# Patient Record
Sex: Female | Born: 1940
Health system: Southern US, Community
[De-identification: ages and names within clinical notes are randomized; demographics above are authoritative.]

## PROBLEM LIST (undated history)

## (undated) DIAGNOSIS — F419 Anxiety disorder, unspecified: Secondary | ICD-10-CM

## (undated) DIAGNOSIS — E119 Type 2 diabetes mellitus without complications: Secondary | ICD-10-CM

## (undated) DIAGNOSIS — M1990 Unspecified osteoarthritis, unspecified site: Secondary | ICD-10-CM

## (undated) DIAGNOSIS — G5600 Carpal tunnel syndrome, unspecified upper limb: Secondary | ICD-10-CM

## (undated) DIAGNOSIS — L570 Actinic keratosis: Secondary | ICD-10-CM

## (undated) DIAGNOSIS — I5030 Unspecified diastolic (congestive) heart failure: Secondary | ICD-10-CM

## (undated) DIAGNOSIS — G2581 Restless legs syndrome: Secondary | ICD-10-CM

## (undated) DIAGNOSIS — H9190 Unspecified hearing loss, unspecified ear: Secondary | ICD-10-CM

## (undated) DIAGNOSIS — Z87442 Personal history of urinary calculi: Secondary | ICD-10-CM

## (undated) DIAGNOSIS — G4730 Sleep apnea, unspecified: Secondary | ICD-10-CM

## (undated) DIAGNOSIS — C50919 Malignant neoplasm of unspecified site of unspecified female breast: Secondary | ICD-10-CM

## (undated) DIAGNOSIS — I10 Essential (primary) hypertension: Secondary | ICD-10-CM

## (undated) DIAGNOSIS — S92909A Unspecified fracture of unspecified foot, initial encounter for closed fracture: Secondary | ICD-10-CM

## (undated) DIAGNOSIS — M952 Other acquired deformity of head: Secondary | ICD-10-CM

## (undated) DIAGNOSIS — F329 Major depressive disorder, single episode, unspecified: Secondary | ICD-10-CM

## (undated) DIAGNOSIS — Z789 Other specified health status: Secondary | ICD-10-CM

## (undated) DIAGNOSIS — Z923 Personal history of irradiation: Secondary | ICD-10-CM

## (undated) DIAGNOSIS — Z9109 Other allergy status, other than to drugs and biological substances: Secondary | ICD-10-CM

## (undated) DIAGNOSIS — R296 Repeated falls: Secondary | ICD-10-CM

## (undated) HISTORY — DX: Restless legs syndrome: G25.81

## (undated) HISTORY — DX: Other specified health status: Z78.9

## (undated) HISTORY — PX: INCISION AND DRAINAGE PERIRECTAL ABSCESS: SHX1804

## (undated) HISTORY — DX: Unspecified hearing loss, unspecified ear: H91.90

## (undated) HISTORY — DX: Unspecified osteoarthritis, unspecified site: M19.90

## (undated) HISTORY — DX: Unspecified diastolic (congestive) heart failure: I50.30

## (undated) HISTORY — DX: Major depressive disorder, single episode, unspecified: F32.9

## (undated) HISTORY — DX: Essential (primary) hypertension: I10

## (undated) HISTORY — DX: Actinic keratosis: L57.0

## (undated) HISTORY — DX: Type 2 diabetes mellitus without complications: E11.9

## (undated) HISTORY — DX: Other acquired deformity of head: M95.2

## (undated) HISTORY — DX: Sleep apnea, unspecified: G47.30

## (undated) HISTORY — PX: REPLACEMENT TOTAL KNEE: SUR1224

## (undated) HISTORY — DX: Unspecified fracture of unspecified foot, initial encounter for closed fracture: S92.909A

## (undated) HISTORY — PX: OTHER SURGICAL HISTORY: SHX169

## (undated) HISTORY — PX: APPENDECTOMY: SHX54

## (undated) HISTORY — DX: Malignant neoplasm of unspecified site of unspecified female breast: C50.919

## (undated) HISTORY — DX: Carpal tunnel syndrome, unspecified upper limb: G56.00

---

## 1977-05-18 HISTORY — PX: TUBAL LIGATION: SHX77

## 1999-01-24 ENCOUNTER — Encounter: Admission: RE | Admit: 1999-01-24 | Discharge: 1999-04-24 | Payer: Self-pay | Admitting: Internal Medicine

## 2004-04-02 ENCOUNTER — Ambulatory Visit: Payer: Self-pay | Admitting: Internal Medicine

## 2004-05-18 HISTORY — PX: JOINT REPLACEMENT: SHX530

## 2004-09-30 ENCOUNTER — Ambulatory Visit: Payer: Self-pay | Admitting: Internal Medicine

## 2005-02-09 ENCOUNTER — Ambulatory Visit: Payer: Self-pay | Admitting: Internal Medicine

## 2005-03-16 ENCOUNTER — Ambulatory Visit: Payer: Self-pay | Admitting: Internal Medicine

## 2005-05-25 ENCOUNTER — Ambulatory Visit: Payer: Self-pay | Admitting: Internal Medicine

## 2005-05-29 ENCOUNTER — Ambulatory Visit: Payer: Self-pay | Admitting: Internal Medicine

## 2005-06-01 ENCOUNTER — Ambulatory Visit: Payer: Self-pay | Admitting: Internal Medicine

## 2005-06-08 ENCOUNTER — Ambulatory Visit: Payer: Self-pay | Admitting: Internal Medicine

## 2005-06-16 ENCOUNTER — Ambulatory Visit: Payer: Self-pay | Admitting: Cardiology

## 2005-06-17 ENCOUNTER — Ambulatory Visit: Payer: Self-pay

## 2005-06-18 ENCOUNTER — Ambulatory Visit: Payer: Self-pay | Admitting: Cardiology

## 2005-06-18 ENCOUNTER — Encounter: Payer: Self-pay | Admitting: Cardiology

## 2005-06-19 ENCOUNTER — Inpatient Hospital Stay (HOSPITAL_COMMUNITY): Admission: RE | Admit: 2005-06-19 | Discharge: 2005-06-24 | Payer: Self-pay | Admitting: Orthopedic Surgery

## 2005-06-22 ENCOUNTER — Ambulatory Visit: Payer: Self-pay | Admitting: Internal Medicine

## 2005-08-18 ENCOUNTER — Ambulatory Visit: Payer: Self-pay | Admitting: Internal Medicine

## 2005-10-19 ENCOUNTER — Ambulatory Visit: Payer: Self-pay | Admitting: Internal Medicine

## 2005-11-16 ENCOUNTER — Encounter: Payer: Self-pay | Admitting: Internal Medicine

## 2005-11-16 ENCOUNTER — Other Ambulatory Visit: Admission: RE | Admit: 2005-11-16 | Discharge: 2005-11-16 | Payer: Self-pay | Admitting: Internal Medicine

## 2005-11-16 ENCOUNTER — Ambulatory Visit: Payer: Self-pay | Admitting: Internal Medicine

## 2006-04-06 ENCOUNTER — Ambulatory Visit: Payer: Self-pay | Admitting: Internal Medicine

## 2006-07-07 ENCOUNTER — Ambulatory Visit: Payer: Self-pay | Admitting: Internal Medicine

## 2006-10-20 ENCOUNTER — Ambulatory Visit: Payer: Self-pay | Admitting: Internal Medicine

## 2006-10-20 LAB — CONVERTED CEMR LAB
CO2: 32 meq/L (ref 19–32)
Calcium: 9.4 mg/dL (ref 8.4–10.5)
Chloride: 103 meq/L (ref 96–112)
Creatinine, Ser: 0.7 mg/dL (ref 0.4–1.2)
Glucose, Bld: 123 mg/dL — ABNORMAL HIGH (ref 70–99)
Sodium: 140 meq/L (ref 135–145)

## 2007-01-27 DIAGNOSIS — I10 Essential (primary) hypertension: Secondary | ICD-10-CM | POA: Insufficient documentation

## 2007-01-27 DIAGNOSIS — M1990 Unspecified osteoarthritis, unspecified site: Secondary | ICD-10-CM | POA: Insufficient documentation

## 2007-02-07 ENCOUNTER — Telehealth: Payer: Self-pay | Admitting: Internal Medicine

## 2007-02-09 ENCOUNTER — Ambulatory Visit (HOSPITAL_BASED_OUTPATIENT_CLINIC_OR_DEPARTMENT_OTHER): Admission: RE | Admit: 2007-02-09 | Discharge: 2007-02-09 | Payer: Self-pay | Admitting: Internal Medicine

## 2007-02-09 ENCOUNTER — Encounter: Payer: Self-pay | Admitting: Pulmonary Disease

## 2007-02-22 ENCOUNTER — Telehealth: Payer: Self-pay | Admitting: *Deleted

## 2007-02-23 ENCOUNTER — Ambulatory Visit: Payer: Self-pay | Admitting: Pulmonary Disease

## 2007-02-25 ENCOUNTER — Ambulatory Visit: Payer: Self-pay | Admitting: Internal Medicine

## 2007-03-01 ENCOUNTER — Encounter: Payer: Self-pay | Admitting: *Deleted

## 2007-03-01 DIAGNOSIS — G4733 Obstructive sleep apnea (adult) (pediatric): Secondary | ICD-10-CM | POA: Insufficient documentation

## 2007-03-10 ENCOUNTER — Ambulatory Visit: Payer: Self-pay | Admitting: Pulmonary Disease

## 2007-03-14 ENCOUNTER — Encounter: Payer: Self-pay | Admitting: Internal Medicine

## 2007-04-05 ENCOUNTER — Encounter: Payer: Self-pay | Admitting: Pulmonary Disease

## 2007-05-05 ENCOUNTER — Ambulatory Visit: Payer: Self-pay | Admitting: Pulmonary Disease

## 2007-05-05 DIAGNOSIS — G2581 Restless legs syndrome: Secondary | ICD-10-CM | POA: Insufficient documentation

## 2007-05-05 HISTORY — DX: Restless legs syndrome: G25.81

## 2007-05-20 ENCOUNTER — Ambulatory Visit: Payer: Self-pay | Admitting: Family Medicine

## 2007-06-24 ENCOUNTER — Telehealth: Payer: Self-pay | Admitting: Internal Medicine

## 2007-07-07 ENCOUNTER — Telehealth: Payer: Self-pay | Admitting: Internal Medicine

## 2007-07-20 ENCOUNTER — Ambulatory Visit: Payer: Self-pay | Admitting: Internal Medicine

## 2007-07-20 LAB — CONVERTED CEMR LAB
CO2: 33 meq/L — ABNORMAL HIGH (ref 19–32)
Calcium: 9.8 mg/dL (ref 8.4–10.5)
Chloride: 100 meq/L (ref 96–112)
Hgb A1c MFr Bld: 7.2 % — ABNORMAL HIGH (ref 4.6–6.0)
Potassium: 4.5 meq/L (ref 3.5–5.1)
Sodium: 140 meq/L (ref 135–145)

## 2007-10-27 ENCOUNTER — Ambulatory Visit: Payer: Self-pay | Admitting: Internal Medicine

## 2007-10-31 ENCOUNTER — Telehealth: Payer: Self-pay | Admitting: *Deleted

## 2008-03-16 ENCOUNTER — Ambulatory Visit: Payer: Self-pay | Admitting: Internal Medicine

## 2008-03-16 LAB — CONVERTED CEMR LAB
ALT: 19 units/L (ref 0–35)
Alkaline Phosphatase: 86 units/L (ref 39–117)
Bilirubin, Direct: 0.2 mg/dL (ref 0.0–0.3)
LDL Cholesterol: 102 mg/dL — ABNORMAL HIGH (ref 0–99)
Total Bilirubin: 0.8 mg/dL (ref 0.3–1.2)
Total Protein: 6.8 g/dL (ref 6.0–8.3)

## 2008-03-22 ENCOUNTER — Ambulatory Visit: Payer: Self-pay | Admitting: Internal Medicine

## 2008-03-29 ENCOUNTER — Ambulatory Visit: Payer: Self-pay | Admitting: Internal Medicine

## 2008-03-30 ENCOUNTER — Telehealth: Payer: Self-pay | Admitting: *Deleted

## 2008-05-07 ENCOUNTER — Telehealth: Payer: Self-pay | Admitting: Internal Medicine

## 2008-07-05 ENCOUNTER — Ambulatory Visit: Payer: Self-pay | Admitting: Internal Medicine

## 2008-07-05 LAB — CONVERTED CEMR LAB
ALT: 17 units/L (ref 0–35)
AST: 19 units/L (ref 0–37)
Bilirubin, Direct: 0.2 mg/dL (ref 0.0–0.3)
Microalb, Ur: 0.8 mg/dL (ref 0.0–1.9)
Total Bilirubin: 0.7 mg/dL (ref 0.3–1.2)
Total Protein: 6.9 g/dL (ref 6.0–8.3)

## 2008-07-18 ENCOUNTER — Ambulatory Visit: Payer: Self-pay | Admitting: Internal Medicine

## 2008-07-24 ENCOUNTER — Telehealth: Payer: Self-pay | Admitting: Internal Medicine

## 2008-07-30 ENCOUNTER — Telehealth: Payer: Self-pay | Admitting: Internal Medicine

## 2008-10-18 ENCOUNTER — Ambulatory Visit: Payer: Self-pay | Admitting: Internal Medicine

## 2008-10-18 DIAGNOSIS — E663 Overweight: Secondary | ICD-10-CM | POA: Insufficient documentation

## 2008-10-18 LAB — CONVERTED CEMR LAB
CO2: 30 meq/L (ref 19–32)
Creatinine, Ser: 0.5 mg/dL (ref 0.4–1.2)
GFR calc non Af Amer: 129.8 mL/min (ref >60)
Glucose, Bld: 132 mg/dL — ABNORMAL HIGH (ref 70–99)
Potassium: 3.7 meq/L (ref 3.5–5.1)
Sodium: 138 meq/L (ref 135–145)

## 2009-03-07 ENCOUNTER — Ambulatory Visit: Payer: Self-pay | Admitting: Internal Medicine

## 2009-06-07 ENCOUNTER — Ambulatory Visit: Payer: Self-pay | Admitting: Internal Medicine

## 2009-06-07 LAB — CONVERTED CEMR LAB
BUN: 15 mg/dL (ref 6–23)
CO2: 32 meq/L (ref 19–32)
Calcium: 9.2 mg/dL (ref 8.4–10.5)
Creatinine, Ser: 0.6 mg/dL (ref 0.4–1.2)
GFR calc non Af Amer: 104.98 mL/min (ref >60)
Glucose, Bld: 136 mg/dL — ABNORMAL HIGH (ref 70–99)
Hgb A1c MFr Bld: 7.3 % — ABNORMAL HIGH (ref 4.6–6.5)
Potassium: 3.4 meq/L — ABNORMAL LOW (ref 3.5–5.1)
Sodium: 138 meq/L (ref 135–145)

## 2009-07-15 ENCOUNTER — Ambulatory Visit: Payer: Self-pay | Admitting: Family Medicine

## 2009-07-16 ENCOUNTER — Ambulatory Visit: Payer: Self-pay | Admitting: Family Medicine

## 2009-07-23 ENCOUNTER — Ambulatory Visit: Payer: Self-pay | Admitting: Internal Medicine

## 2009-07-24 ENCOUNTER — Ambulatory Visit: Payer: Self-pay | Admitting: Internal Medicine

## 2009-08-22 ENCOUNTER — Encounter: Payer: Self-pay | Admitting: Internal Medicine

## 2009-08-30 ENCOUNTER — Ambulatory Visit: Payer: Self-pay | Admitting: Internal Medicine

## 2009-08-30 LAB — CONVERTED CEMR LAB
Basophils Relative: 0.4 % (ref 0.0–3.0)
Eosinophils Absolute: 0.4 10*3/uL (ref 0.0–0.7)
Eosinophils Relative: 3.9 % (ref 0.0–5.0)
Lymphocytes Relative: 22.8 % (ref 12.0–46.0)
Monocytes Relative: 8.2 % (ref 3.0–12.0)
Neutro Abs: 7.2 10*3/uL (ref 1.4–7.7)
Neutrophils Relative %: 64.7 % (ref 43.0–77.0)
Platelets: 268 10*3/uL (ref 150.0–400.0)
RDW: 13.7 % (ref 11.5–14.6)

## 2009-09-04 ENCOUNTER — Telehealth: Payer: Self-pay | Admitting: Internal Medicine

## 2009-09-09 ENCOUNTER — Ambulatory Visit: Payer: Self-pay | Admitting: Internal Medicine

## 2010-02-14 ENCOUNTER — Ambulatory Visit: Payer: Self-pay | Admitting: Internal Medicine

## 2010-02-14 ENCOUNTER — Telehealth: Payer: Self-pay | Admitting: Internal Medicine

## 2010-02-14 LAB — CONVERTED CEMR LAB
BUN: 15 mg/dL (ref 6–23)
CO2: 33 meq/L — ABNORMAL HIGH (ref 19–32)
Chloride: 98 meq/L (ref 96–112)
Creatinine, Ser: 0.7 mg/dL (ref 0.4–1.2)
GFR calc non Af Amer: 90.68 mL/min (ref >60)
LDH: 181 units/L (ref 94–250)
Potassium: 3.8 meq/L (ref 3.5–5.1)

## 2010-03-04 ENCOUNTER — Encounter: Admission: RE | Admit: 2010-03-04 | Discharge: 2010-03-04 | Payer: Self-pay | Admitting: Internal Medicine

## 2010-03-12 ENCOUNTER — Encounter: Payer: Self-pay | Admitting: Internal Medicine

## 2010-03-12 ENCOUNTER — Encounter: Admission: RE | Admit: 2010-03-12 | Discharge: 2010-03-12 | Payer: Self-pay | Admitting: Internal Medicine

## 2010-03-20 ENCOUNTER — Encounter: Payer: Self-pay | Admitting: Internal Medicine

## 2010-03-21 ENCOUNTER — Ambulatory Visit (HOSPITAL_COMMUNITY): Admission: RE | Admit: 2010-03-21 | Discharge: 2010-03-21 | Payer: Self-pay | Admitting: Internal Medicine

## 2010-03-25 ENCOUNTER — Ambulatory Visit: Admission: RE | Admit: 2010-03-25 | Discharge: 2010-03-31 | Payer: Self-pay | Admitting: Radiation Oncology

## 2010-03-26 ENCOUNTER — Encounter: Payer: Self-pay | Admitting: Internal Medicine

## 2010-04-04 ENCOUNTER — Encounter: Payer: Self-pay | Admitting: Internal Medicine

## 2010-04-04 ENCOUNTER — Telehealth: Payer: Self-pay | Admitting: Internal Medicine

## 2010-04-04 DIAGNOSIS — C50411 Malignant neoplasm of upper-outer quadrant of right female breast: Secondary | ICD-10-CM | POA: Insufficient documentation

## 2010-04-17 ENCOUNTER — Encounter: Payer: Self-pay | Admitting: Internal Medicine

## 2010-04-17 ENCOUNTER — Ambulatory Visit: Payer: Self-pay | Admitting: Cardiology

## 2010-04-18 ENCOUNTER — Ambulatory Visit (HOSPITAL_COMMUNITY)
Admission: RE | Admit: 2010-04-18 | Discharge: 2010-04-18 | Payer: Self-pay | Source: Home / Self Care | Admitting: Internal Medicine

## 2010-04-22 ENCOUNTER — Encounter: Admission: RE | Admit: 2010-04-22 | Discharge: 2010-04-22 | Payer: Self-pay | Admitting: General Surgery

## 2010-04-23 ENCOUNTER — Encounter
Admission: RE | Admit: 2010-04-23 | Discharge: 2010-04-23 | Payer: Self-pay | Source: Home / Self Care | Admitting: General Surgery

## 2010-04-23 ENCOUNTER — Encounter: Payer: Self-pay | Admitting: Internal Medicine

## 2010-04-23 ENCOUNTER — Ambulatory Visit
Admission: RE | Admit: 2010-04-23 | Discharge: 2010-04-24 | Payer: Self-pay | Source: Home / Self Care | Attending: General Surgery | Admitting: General Surgery

## 2010-04-25 ENCOUNTER — Ambulatory Visit
Admission: RE | Admit: 2010-04-25 | Discharge: 2010-05-07 | Payer: Self-pay | Source: Home / Self Care | Attending: Radiation Oncology | Admitting: Radiation Oncology

## 2010-05-01 ENCOUNTER — Ambulatory Visit: Payer: Self-pay | Admitting: Oncology

## 2010-05-18 HISTORY — PX: BREAST LUMPECTOMY: SHX2

## 2010-05-18 HISTORY — PX: BREAST SURGERY: SHX581

## 2010-05-26 LAB — CBC WITH DIFFERENTIAL/PLATELET
BASO%: 0.2 % (ref 0.0–2.0)
Basophils Absolute: 0 10*3/uL (ref 0.0–0.1)
EOS%: 3.4 % (ref 0.0–7.0)
Eosinophils Absolute: 0.3 10*3/uL (ref 0.0–0.5)
HCT: 41.1 % (ref 34.8–46.6)
HGB: 13.9 g/dL (ref 11.6–15.9)
LYMPH%: 14.9 % (ref 14.0–49.7)
MCH: 31.8 pg (ref 25.1–34.0)
MCHC: 33.7 g/dL (ref 31.5–36.0)
MCV: 94.3 fL (ref 79.5–101.0)
MONO#: 0.7 10*3/uL (ref 0.1–0.9)
MONO%: 7.5 % (ref 0.0–14.0)
NEUT#: 6.6 10*3/uL — ABNORMAL HIGH (ref 1.5–6.5)
NEUT%: 74 % (ref 38.4–76.8)
Platelets: 213 10*3/uL (ref 145–400)
RBC: 4.36 10*6/uL (ref 3.70–5.45)
RDW: 14 % (ref 11.2–14.5)
WBC: 8.9 10*3/uL (ref 3.9–10.3)
lymph#: 1.3 10*3/uL (ref 0.9–3.3)

## 2010-05-26 LAB — COMPREHENSIVE METABOLIC PANEL
ALT: 14 U/L (ref 0–35)
AST: 17 U/L (ref 0–37)
Albumin: 4 g/dL (ref 3.5–5.2)
Alkaline Phosphatase: 83 U/L (ref 39–117)
BUN: 20 mg/dL (ref 6–23)
CO2: 28 mEq/L (ref 19–32)
Calcium: 9.3 mg/dL (ref 8.4–10.5)
Chloride: 99 mEq/L (ref 96–112)
Creatinine, Ser: 0.65 mg/dL (ref 0.40–1.20)
Glucose, Bld: 201 mg/dL — ABNORMAL HIGH (ref 70–99)
Potassium: 3.7 mEq/L (ref 3.5–5.3)
Sodium: 138 mEq/L (ref 135–145)
Total Bilirubin: 0.5 mg/dL (ref 0.3–1.2)
Total Protein: 7.4 g/dL (ref 6.0–8.3)

## 2010-05-26 LAB — CANCER ANTIGEN 27.29: CA 27.29: 33 U/mL (ref 0–39)

## 2010-06-04 ENCOUNTER — Encounter: Payer: Self-pay | Admitting: Internal Medicine

## 2010-06-07 ENCOUNTER — Encounter: Payer: Self-pay | Admitting: Internal Medicine

## 2010-06-10 ENCOUNTER — Ambulatory Visit
Admission: RE | Admit: 2010-06-10 | Discharge: 2010-06-10 | Payer: Self-pay | Source: Home / Self Care | Attending: Internal Medicine | Admitting: Internal Medicine

## 2010-06-10 ENCOUNTER — Encounter: Payer: Self-pay | Admitting: Internal Medicine

## 2010-06-10 DIAGNOSIS — M899 Disorder of bone, unspecified: Secondary | ICD-10-CM | POA: Insufficient documentation

## 2010-06-10 DIAGNOSIS — M949 Disorder of cartilage, unspecified: Secondary | ICD-10-CM

## 2010-06-19 NOTE — Assessment & Plan Note (Signed)
Summary: R ANKLE PAIN (problems ambulating due to same) // RS   Vital Signs:  Patient profile:   70 year old female Temp:     99.2 degrees F oral BP sitting:   120 / 80  (left arm) Cuff size:   regular  Vitals Entered By: Sid Falcon LPN (July 15, 2009 4:31 PM) CC: Right ankle pain X3 weeks   History of Present Illness: Patient seen with 3 week history right ankle pain. No injury. Pain medial aspect ankle. 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 Pain with ambulation mostly and occasionally at rest. No history of gout. Has not tried any medications. No recent change of shoe wear prior to ankle pain onset. Type 2 diabetes  Allergies: 1)  ! Sulfa  Past History:  Past Medical History: Last updated: 01/27/2007 Diabetes mellitus, type II Hypertension Osteoarthritis  Social History: Last updated: 01/27/2007 Married Never Smoked Alcohol use-no PMH reviewed for relevance, PSH reviewed for relevance  Review of Systems       The patient complains of difficulty walking.  The patient denies fever and muscle weakness.    Physical Exam  General:  Well-developed,well-nourished,in no acute distress; alert,appropriate and cooperative throughout examination Extremities:  right ankle reveals some minimal swelling along the medial malleolus and minimal tenderness to palpation of that region. No lateral joint tenderness. Minimal pain with eversion and inversion. No pain with dorsiflexion or plantarflexion. Achilles Tendon is fully intact.  No foot bone tenderness. Neurologic:  good strength with dorsi and plantar flexion. Skin:  no rash.   Impression & Recommendations:  Problem # 1:  ANKLE PAIN (ICD-719.47) X-rays to further evaluate.  She will try short term use of OTC NSAID. Orders: T-Ankle Comp Right ([id number])  Complete Medication List: 1)  Lisinopril-hydrochlorothiazide 20-25 Mg Tabs (Lisinopril-hydrochlorothiazide) .... Once  daily 2)  Detrol La 4 Mg Cp24 (Tolterodine tartrate) .Marland Kitchen.. 1 once daily 3)  Amlodipine Besylate 10 Mg Tabs (Amlodipine besylate) .Marland Kitchen.. 1 once daily 4)  Mirapex 0.25 Mg Tabs (Pramipexole dihydrochloride) .... Take 1 tab by mouth at bedtime 5)  Freestyle Lite Strp (Glucose blood) .... Use to check fingerstick glucoses bid 6)  Metformin Hcl 500 Mg Xr24h-tab (Metformin hcl) .... Three by mouth at hs  Patient Instructions: 1)  Try short-term use of Aleve or Advil for ankle pain. 2)  We will call you regarding x-ray results

## 2010-06-19 NOTE — Miscellaneous (Signed)
Summary: Appointment Canceled  Appointment status changed to canceled by [employer] on 04/04/2010 4:02 PM.  Cancellation Comments --------------------- ecs/sur clearnace,chest pain breast cancer,dyspnea/humana/mj  Appointment Information ----------------------- Appt Type:  CARDIOLOGY ANCILLARY VISIT      Date:  Friday, April 25, 2010      Time:  2:00 PM for 60 min   Urgency:  Routine   Made By:  Pearson Grippe  To Visit:  [id number]    Reason:  ecs/sur clearnace,chest pain breast cancer,dyspnea/humana/mj  Appt Comments ------------- -- 04/04/10 16:02: (CEM[facility]) CANCELED -- ecs/sur clearnace,chest pain breast cancer,dyspnea/humana/mj -- 04/04/10 15:49: (CEMR) BOOKED -- Routine CARDIOLOGY ANCILLARY VISIT at 04/25/2010 2:00 PM for 60 min ecs/sur clearnace,chest pain breast cancer,dyspn  Appended Document: Appointment Canceled looks like she canceled this appointment  Appended Document: Appointment Canceled it was cancelled because they gave her an earlier appointment on 11/30

## 2010-06-19 NOTE — Assessment & Plan Note (Signed)
Summary: 3 month fup//ccm   Vital Signs:  Patient profile:   70 year old female Height:      62 inches Weight:      197 pounds BMI:     36.16 Temp:     98.2 degrees F oral Pulse rate:   76 / minute Resp:     14 per minute BP sitting:   140 / 80  (left arm)  Vitals Entered By: Willy Eddy, LPN (August 30, 2009 11:07 AM) CC: roa, Hypertension Management   CC:  roa and Hypertension Management.  History of Present Illness: Had eye exam and has been monitering cbg AND NOTES these are vary variable increased back pain, increased back pain after sitting about one hour has been seen by orthopedist in the past she rates the pain as worse when sitting ( she was seen at [facility]) Ethelene Hal)  and   Hypertension History:      She denies headache, chest pain, palpitations, dyspnea with exertion, orthopnea, PND, peripheral edema, visual symptoms, neurologic problems, syncope, and side effects from treatment.        Positive major cardiovascular risk factors include female age 32 years old or older, diabetes, and hypertension.  Negative major cardiovascular risk factors include non-tobacco-user status.     Preventive Screening-Counseling & Management  Alcohol-Tobacco     Smoking Status: never  Problems Prior to Update: 1)  Shoulder Pain, Right  (ICD-719.41) 2)  Ankle Pain  (ICD-719.47) 3)  Acute Sinusitis, Unspecified  (ICD-461.9) 4)  Overweight  (ICD-278.02) 5)  Actinic Keratosis  (ICD-702.0) 6)  Urinary Frequency, Chronic  (ICD-788.41) 7)  Pruritus  (ICD-698.9) 8)  Closed Fracture of One or More Phalanges of Foot  (ICD-826.0) 9)  Black Eye, Not Otherwise Specified  (ICD-921.0) 10)  Restless Legs Syndrome  (ICD-333.94) 11)  Obstructive Sleep Apnea  (ICD-327.23) 12)  Osteoarthritis  (ICD-715.90) 13)  Hypertension  (ICD-401.9) 14)  Diabetes Mellitus, Type II  (ICD-250.00)  Current Problems (verified): 1)  Shoulder Pain, Right  (ICD-719.41) 2)  Ankle Pain   (ICD-719.47) 3)  Acute Sinusitis, Unspecified  (ICD-461.9) 4)  Overweight  (ICD-278.02) 5)  Actinic Keratosis  (ICD-702.0) 6)  Urinary Frequency, Chronic  (ICD-788.41) 7)  Pruritus  (ICD-698.9) 8)  Closed Fracture of One or More Phalanges of Foot  (ICD-826.0) 9)  Black Eye, Not Otherwise Specified  (ICD-921.0) 10)  Restless Legs Syndrome  (ICD-333.94) 11)  Obstructive Sleep Apnea  (ICD-327.23) 12)  Osteoarthritis  (ICD-715.90) 13)  Hypertension  (ICD-401.9) 14)  Diabetes Mellitus, Type II  (ICD-250.00)  Medications Prior to Update: 1)  Lisinopril-Hydrochlorothiazide 20-25 Mg  Tabs (Lisinopril-Hydrochlorothiazide) .... Once Daily 2)  Detrol La 4 Mg  Cp24 (Tolterodine Tartrate) .Marland Kitchen.. 1 Once Daily 3)  Amlodipine Besylate 10 Mg Tabs (Amlodipine Besylate) .Marland Kitchen.. 1 Once Daily 4)  Mirapex 0.25 Mg  Tabs (Pramipexole Dihydrochloride) .... Take 1 Tab By Mouth At Bedtime 5)  Freestyle Lite   Strp (Glucose Blood) .... Use To Check Fingerstick Glucoses Bid 6)  Metformin Hcl 500 Mg Xr24h-Tab (Metformin Hcl) .... Three By Mouth At [facility]  Current Medications (verified): 1)  Lisinopril-Hydrochlorothiazide 20-25 Mg  Tabs (Lisinopril-Hydrochlorothiazide) .... Once Daily 2)  Detrol La 4 Mg  Cp24 (Tolterodine Tartrate) .Marland Kitchen.. 1 Once Daily 3)  Amlodipine Besylate 10 Mg Tabs (Amlodipine Besylate) .Marland Kitchen.. 1 Once Daily 4)  Mirapex 0.25 Mg  Tabs (Pramipexole Dihydrochloride) .... Take 1 Tab By Mouth At Bedtime 5)  Freestyle Lite   Strp (Glucose Blood) .Marland KitchenMarland KitchenMarland Kitchen  Use To Check Fingerstick Glucoses Bid 6)  Metformin Hcl 500 Mg Xr24h-Tab (Metformin Hcl) .... Three By Mouth At [facility]  Allergies (verified): 1)  ! Sulfa  Past History:  Social History: Last updated: 01/27/2007 Married Never Smoked Alcohol use-no  Risk Factors: Smoking Status: never (08/30/2009)  Past medical, surgical, family and social histories (including risk factors) reviewed, and no changes noted (except as noted below).  Past Medical History: Reviewed  history from 01/27/2007 and no changes required. Diabetes mellitus, type II Hypertension Osteoarthritis  Past Surgical History: Reviewed history from 01/27/2007 and no changes required. Flexible Sigmoidoscopy Perirectal abscess drainage Appendectomy  Family History: Reviewed history and no changes required.  Social History: Reviewed history from 01/27/2007 and no changes required. Married Never Smoked Alcohol use-no  Review of Systems  The patient denies anorexia, fever, weight loss, weight gain, vision loss, decreased hearing, hoarseness, chest pain, syncope, dyspnea on exertion, peripheral edema, prolonged cough, headaches, hemoptysis, abdominal pain, melena, hematochezia, severe indigestion/heartburn, hematuria, incontinence, genital sores, muscle weakness, suspicious skin lesions, transient blindness, difficulty walking, depression, unusual weight change, abnormal bleeding, enlarged lymph nodes, angioedema, and breast masses.    Physical Exam  General:  Well-developed,well-nourished,in no acute distress; alert,appropriate and cooperative throughout examination Head:  Normocephalic and atraumatic without obvious abnormalities. No apparent alopecia or balding. Nose:  no external deformity and no nasal discharge.   Mouth:  posterior lymphoid hypertrophy and postnasal drip.   Lungs:  normal respiratory effort and no intercostal retractions.   Heart:  normal rate and regular rhythm.   Abdomen:  Bowel sounds positive,abdomen soft and non-tender without masses, organomegaly or hernias noted. Extremities:  trace left pedal edema and trace right pedal edema.   Neurologic:  alert & oriented X3 and gait normal.    Diabetes Management Exam:    Foot Exam (with socks and/or shoes not present):       Sensory-Pinprick/Light touch:          Left medial foot (L-4): diminished          Left dorsal foot (L-5): diminished          Left lateral foot (S-1): diminished          Right medial  foot (L-4): diminished          Right dorsal foot (L-5): diminished          Right lateral foot (S-1): diminished    Eye Exam:       Eye Exam done elsewhere          Date: 07/16/2009          Results: normal          Done by: doctors   Impression & Recommendations:  Problem # 1:  HYPERTENSION (ICD-401.9)  Her updated medication list for this problem includes:    Lisinopril-hydrochlorothiazide 20-25 Mg Tabs (Lisinopril-hydrochlorothiazide) ..... Once daily    Amlodipine Besylate 10 Mg Tabs (Amlodipine besylate) .Marland Kitchen... 1 once daily  BP today: 140/80 Prior BP: 144/80 (07/23/2009)  Prior 10 Yr Risk Heart Disease: > 32 % (03/07/2009)  Labs Reviewed: K+: 3.4 (06/07/2009) Creat: : 0.6 (06/07/2009)   Chol: 150 (03/16/2008)   HDL: 34.8 (03/16/2008)   LDL: 102 (03/16/2008)   TG: 64 (03/16/2008)  Problem # 2:  DIABETES MELLITUS, TYPE II (ICD-250.00)  Her updated medication list for this problem includes:    Lisinopril-hydrochlorothiazide 20-25 Mg Tabs (Lisinopril-hydrochlorothiazide) ..... Once daily    Metformin Hcl 500 Mg Xr24h-tab (Metformin hcl) .Marland Kitchen... 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 Back care instructions given. To be seen in 2 weeks if no improvement; sooner if worsening of symptoms.   Complete Medication List: 1)  Lisinopril-hydrochlorothiazide 20-25 Mg Tabs (Lisinopril-hydrochlorothiazide)  .... Once daily 2)  Detrol La 4 Mg Cp24 (Tolterodine tartrate) .Marland Kitchen.. 1 once daily 3)  Amlodipine Besylate 10 Mg Tabs (Amlodipine besylate) .Marland Kitchen.. 1 once daily 4)  Mirapex 0.25 Mg Tabs (Pramipexole dihydrochloride) .... Take 1 tab by mouth at bedtime 5)  Freestyle Lite Strp (Glucose blood) .... Use to check fingerstick glucoses bid 6)  Metformin Hcl 500 Mg Xr24h-tab (Metformin hcl) .... Three by mouth at hs  Other Orders: Venipuncture ([id number]) TLB-CBC Platelet - w/Differential (85025-CBCD)  Hypertension Assessment/Plan:      The patient's hypertensive risk group is category C: Target organ damage and/or diabetes.  Her calculated 10 year risk of coronary heart disease is > 32 %.  Today's blood pressure is 140/80.  Her blood pressure goal is < 130/80.  Patient Instructions: 1)  Please schedule a follow-up appointment in 4 months.  end of august Prescriptions: METFORMIN HCL 500 MG XR24H-TAB (METFORMIN HCL) three by mouth at HS  #90 x 2   Entered and Authorized by:   Stacie Glaze MD   Signed by:   Stacie Glaze MD on 08/30/2009   Method used:   Print then Give to Patient   RxID:   [id number] MIRAPEX 0.25 MG  TABS (PRAMIPEXOLE DIHYDROCHLORIDE) Take 1 tab by mouth at bedtime  #30 Each x 5   Entered and Authorized by:   Stacie Glaze MD   Signed by:   Stacie Glaze MD on 08/30/2009   Method used:   Print then Give to Patient   RxID:   [id number] AMLODIPINE BESYLATE 10 MG TABS (AMLODIPINE BESYLATE) 1 once daily  #90 Each x 2   Entered and Authorized by:   Stacie Glaze MD   Signed by:   Stacie Glaze MD on 08/30/2009   Method used:   Print then Give to Patient   RxID:   [id number] DETROL LA 4 MG  CP24 (TOLTERODINE TARTRATE) 1 once daily  #30 x 6   Entered and Authorized by:   Stacie Glaze MD   Signed by:   Stacie Glaze MD on 08/30/2009   Method used:   Print then Give to Patient   RxID:   [id number] LISINOPRIL-HYDROCHLOROTHIAZIDE 20-25 MG  TABS  (LISINOPRIL-HYDROCHLOROTHIAZIDE) once daily  #30 x 2   Entered and Authorized by:   Stacie Glaze MD   Signed by:   Stacie Glaze MD on 08/30/2009   Method used:   Print then Give to Patient   RxID:   [id number]

## 2010-06-19 NOTE — Letter (Signed)
Summary: Eye Exam/[facility]  Eye Exam/[facility]   Imported By: Maryln Gottron 08/28/2009 15:32:27  _____________________________________________________________________  External Attachment:    Type:   Image     Comment:   External Document

## 2010-06-19 NOTE — Progress Notes (Signed)
Summary: Pt is waiting at [facility] pharmacy for script to be called in  Phone Note Call from Patient Call back at Home Phone [phone]   Caller: Patient Reason for Call: Acute Illness Summary of Call: Pt calld and is currently at the [facility] waiting for her prescription. Pls call in asap.  Initial call taken by: Lucy Antigua,  February 14, 2010 3:24 PM  Follow-up for Phone Call        3 refills called in Follow-up by: Willy Eddy, LPN,  February 14, 2010 3:35 PM

## 2010-06-19 NOTE — Assessment & Plan Note (Signed)
Summary: 3 MONTH ROV/NJR   Vital Signs:  Patient profile:   70 year old female Height:      62 inches Weight:      197 pounds BMI:     36.16 Temp:     97.9 degrees F oral Pulse rate:   72 / minute Resp:     14 per minute BP sitting:   136 / 75  (left arm)  Vitals Entered By: Willy Eddy, LPN (June 07, 2009 9:56 AM) CC: roa, Hypertension Management, Type 2 diabetes mellitus follow-up   CC:  roa, Hypertension Management, and Type 2 diabetes mellitus follow-up.  History of Present Illness: follow up the restless leg on mirapex ( generic) HTN  monitering and discusion of detrol for bladder control DM review lost the meter "again" and needs a new one  Type 2 Diabetes Mellitus Follow-Up      This is a 70 year old woman who presents for Type 2 diabetes mellitus follow-up.  The patient denies polyuria, polydipsia, blurred vision, self managed hypoglycemia, hypoglycemia requiring help, weight loss, weight gain, and numbness of extremities.  The patient denies the following symptoms: neuropathic pain, chest pain, vomiting, orthostatic symptoms, poor wound healing, intermittent claudication, vision loss, and foot ulcer.  Since the last visit the patient reports poor dietary compliance, not exercising regularly, and not monitoring blood glucose.    Hypertension Follow-Up      The patient also presents for Hypertension follow-up.  The patient denies lightheadedness, urinary frequency, headaches, edema, impotence, rash, and fatigue.  The patient denies the following associated symptoms: chest pain, chest pressure, exercise intolerance, dyspnea, palpitations, syncope, leg edema, and pedal edema.  Compliance with medications (by patient report) has been near 100%.  The patient reports that dietary compliance has been fair.  The patient reports exercising occasionally.    Hypertension History:      She denies headache, chest pain, palpitations, dyspnea with exertion, orthopnea, PND,  peripheral edema, visual symptoms, neurologic problems, syncope, and side effects from treatment.  She notes no problems with any antihypertensive medication side effects.  feels OK.        Positive major cardiovascular risk factors include female age 36 years old or older, diabetes, and hypertension.  Negative major cardiovascular risk factors include non-tobacco-user status.     Preventive Screening-Counseling & Management  Alcohol-Tobacco     Smoking Status: never  Problems Prior to Update: 1)  Acute Sinusitis, Unspecified  (ICD-461.9) 2)  Overweight  (ICD-278.02) 3)  Actinic Keratosis  (ICD-702.0) 4)  Urinary Frequency, Chronic  (ICD-788.41) 5)  Pruritus  (ICD-698.9) 6)  Closed Fracture of One or More Phalanges of Foot  (ICD-826.0) 7)  Black Eye, Not Otherwise Specified  (ICD-921.0) 8)  Restless Legs Syndrome  (ICD-333.94) 9)  Obstructive Sleep Apnea  (ICD-327.23) 10)  Osteoarthritis  (ICD-715.90) 11)  Hypertension  (ICD-401.9) 12)  Diabetes Mellitus, Type II  (ICD-250.00)  Medications Prior to Update: 1)  Lisinopril-Hydrochlorothiazide 20-25 Mg  Tabs (Lisinopril-Hydrochlorothiazide) .... Once Daily 2)  Detrol La 4 Mg  Cp24 (Tolterodine Tartrate) .Marland Kitchen.. 1 Once Daily 3)  Amlodipine Besylate 10 Mg Tabs (Amlodipine Besylate) .Marland Kitchen.. 1 Once Daily 4)  Mirapex 0.25 Mg  Tabs (Pramipexole Dihydrochloride) .... Take 1 Tab By Mouth At Bedtime 5)  Freestyle Lite   Strp (Glucose Blood) .... Use To Check Fingerstick Glucoses Bid 6)  Metformin Hcl 500 Mg Xr24h-Tab (Metformin Hcl) .... Three By Mouth At [facility] 7)  Clarithromycin 500 Mg Xr24h-Tab (Clarithromycin) .... One By  Mouth  Two Times A Day  Current Medications (verified): 1)  Lisinopril-Hydrochlorothiazide 20-25 Mg  Tabs (Lisinopril-Hydrochlorothiazide) .... Once Daily 2)  Detrol La 4 Mg  Cp24 (Tolterodine Tartrate) .Marland Kitchen.. 1 Once Daily 3)  Amlodipine Besylate 10 Mg Tabs (Amlodipine Besylate) .Marland Kitchen.. 1 Once Daily 4)  Mirapex 0.25 Mg  Tabs  (Pramipexole Dihydrochloride) .... Take 1 Tab By Mouth At Bedtime 5)  Freestyle Lite   Strp (Glucose Blood) .... Use To Check Fingerstick Glucoses Bid 6)  Metformin Hcl 500 Mg Xr24h-Tab (Metformin Hcl) .... Three By Mouth At [facility]  Allergies (verified): 1)  ! Sulfa  Past History:  Social History: Last updated: 01/27/2007 Married Never Smoked Alcohol use-no  Risk Factors: Smoking Status: never (06/07/2009)  Past medical, surgical, family and social histories (including risk factors) reviewed for relevance to current acute and chronic problems.  Past Medical History: Reviewed history from 01/27/2007 and no changes required. Diabetes mellitus, type II Hypertension Osteoarthritis  Past Surgical History: Reviewed history from 01/27/2007 and no changes required. Flexible Sigmoidoscopy Perirectal abscess drainage Appendectomy  Family History: Reviewed history and no changes required.  Social History: Reviewed history from 01/27/2007 and no changes required. Married Never Smoked Alcohol use-no  Review of Systems  The patient denies anorexia, fever, weight loss, vision loss, decreased hearing, hoarseness, chest pain, syncope, dyspnea on exertion, peripheral edema, prolonged cough, headaches, hemoptysis, abdominal pain, melena, hematochezia, severe indigestion/heartburn, hematuria, incontinence, genital sores, muscle weakness, suspicious skin lesions, transient blindness, difficulty walking, depression, unusual weight change, abnormal bleeding, enlarged lymph nodes, angioedema, and breast masses.         dry skin  Diabetes Management Exam:    Foot Exam (with socks and/or shoes not present):       Sensory-Pinprick/Light touch:          Left medial foot (L-4): diminished          Left dorsal foot (L-5): diminished          Left lateral foot (S-1): diminished          Right medial foot (L-4): diminished          Right dorsal foot (L-5): diminished          Right lateral foot  (S-1): diminished    Eye Exam:       Eye Exam done elsewhere          Date: 06/12/2009          Results: normal          [facility] by[facility]   Impression & Recommendations:  Problem # 1:  HYPERTENSION (ICD-401.9) urge eweitght loss and exercize Her updated medication list for this problem includes:    Lisinopril-hydrochlorothiazide 20-25 Mg Tabs (Lisinopril-hydrochlorothiazide) ..... Once daily    Amlodipine Besylate 10 Mg Tabs (Amlodipine besylate) .Marland Kitchen... 1 once daily  BP today: 136/75 Prior BP: 142/80 (03/07/2009)  Prior 10 Yr Risk Heart Disease: > 32 % (03/07/2009)  Labs Reviewed: K+: 3.7 (10/18/2008) Creat: : 0.5 (10/18/2008)   Chol: 150 (03/16/2008)   HDL: 34.8 (03/16/2008)   LDL: 102 (03/16/2008)   TG: 64 (03/16/2008)  Problem # 2:  RESTLESS LEGS SYNDROME (ICD-333.94) stable  Problem # 3:  DIABETES MELLITUS, TYPE II (ICD-250.00)  non compliant will moniter lanbs and dissuced the risk of not keeping track! Her updated medication list for this problem includes:    Lisinopril-hydrochlorothiazide 20-25 Mg Tabs (Lisinopril-hydrochlorothiazide) ..... Once daily    Metformin Hcl 500 Mg Xr24h-tab (Metformin hcl) .Marland Kitchen... Three  by mouth at hs  Orders: Venipuncture (60454) TLB-BMP (Basic Metabolic Panel-BMET) (80048-METABOL) TLB-A1C / Hgb A1C (Glycohemoglobin) (83036-A1C)  Labs Reviewed: Creat: 0.5 (10/18/2008)     Last Eye Exam: normal (06/12/2009) Reviewed HgBA1c results: 7.2 (10/18/2008)  7.3 (07/05/2008)  Problem # 4:  OBSTRUCTIVE SLEEP APNEA (ICD-327.23) refused the device  Problem # 5:  PRURITUS (ICD-698.9) dry skin and DM eucerin cream with hydrocort cream/lotion 75/25  Problem # 6:  URINARY FREQUENCY, CHRONIC (ICD-788.41)  Her updated medication list for this problem includes:    Detrol La 4 Mg Cp24 (Tolterodine tartrate) .Marland Kitchen... 1 once daily  Discussed use of medication.   Complete Medication List: 1)  Lisinopril-hydrochlorothiazide 20-25 Mg  Tabs (Lisinopril-hydrochlorothiazide) .... Once daily 2)  Detrol La 4 Mg Cp24 (Tolterodine tartrate) .Marland Kitchen.. 1 once daily 3)  Amlodipine Besylate 10 Mg Tabs (Amlodipine besylate) .Marland Kitchen.. 1 once daily 4)  Mirapex 0.25 Mg Tabs (Pramipexole dihydrochloride) .... Take 1 tab by mouth at bedtime 5)  Freestyle Lite Strp (Glucose blood) .... Use to check fingerstick glucoses bid 6)  Metformin Hcl 500 Mg Xr24h-tab (Metformin hcl) .... Three by mouth at hs  Hypertension Assessment/Plan:      The patient's hypertensive risk group is category C: Target organ damage and/or diabetes.  Her calculated 10 year risk of coronary heart disease is > 32 %.  Today's blood pressure is 136/75.  Her blood pressure goal is < 130/80.  Patient Instructions: 1)  Please schedule a follow-up appointment in 3 months. 2)  Eucerin cream  mixed with coraid 75% eucerine and 25% cortaid for the itching

## 2010-06-19 NOTE — Miscellaneous (Signed)
Summary: Appointment Canceled  Appointment status changed to canceled by [employer] on 04/04/2010 4:02 PM.  Cancellation Comments --------------------- c60/chest pain,dyspnea,breast cancer/humana/mj  Appointment Information ----------------------- Appt Type:  EMR NO DOCUMENT      Date:  Friday, April 25, 2010      Time:  2:00 PM for 30 min   Urgency:  Routine   Made By:  Pearson Grippe  To Visit:  [id number]    Reason:  c60/chest pain,dyspnea,breast cancer/humana/mj  Appt Comments ------------- -- 04/04/10 16:02: (CEMR) CANCELED -- c60/chest pain,dyspnea,breast cancer/humana/mj -- 04/04/10 15:49: (CEMR) BOOKED -- Routine EMR NO DOCUMENT at 04/25/2010 2:00 PM for 30 min c60/chest pain,dyspnea,breast cancer/humana/mj

## 2010-06-19 NOTE — Letter (Signed)
Summary: [facility]  [facility]   Imported By: Maryln Gottron 04/08/2010 12:31:28  _____________________________________________________________________  External Attachment:    Type:   Image     Comment:   External Document

## 2010-06-19 NOTE — Progress Notes (Signed)
Summary: FYI  Phone Note Call from Patient Call back at Home Phone [phone]   Caller: Patient Call For: Stacie Glaze MD Summary of Call: flew to [facility] over the weekend and will get xray next week. FYI Initial call taken by: Raechel Ache, RN,  September 04, 2009 10:18 AM

## 2010-06-19 NOTE — Assessment & Plan Note (Signed)
Summary: 4 MONTH F/U//[facility] rsc per pt out of town/njr   Vital Signs:  Patient profile:   70 year old female Height:      62 inches Weight:      196 pounds BMI:     35.98 Temp:     98.2 degrees F oral Pulse rate:   76 / minute Resp:     14 per minute BP sitting:   140 / 80  (left arm)  Vitals Entered By: Willy Eddy, LPN (February 14, 2010 1:42 PM) CC: roa, Hypertension Management Is Patient Diabetic? No   Primary Care Hayli Milligan:  Stacie Glaze MD  CC:  roa and Hypertension Management.  History of Present Illness: Did not keep the referal to the back specialist  due to insurance Has a  sprained right ankle Chronic back pain no rhematoid DZ DM id poorly controled due to diet non abherance and variable exercize   Hypertension History:      She denies headache, chest pain, palpitations, dyspnea with exertion, orthopnea, PND, peripheral edema, visual symptoms, neurologic problems, syncope, and side effects from treatment.        Positive major cardiovascular risk factors include female age 41 years old or older, diabetes, and hypertension.  Negative major cardiovascular risk factors include non-tobacco-user status.     Preventive Screening-Counseling & Management  Alcohol-Tobacco     Smoking Status: never     Tobacco Counseling: not indicated; no tobacco use  Problems Prior to Update: 1)  Back Pain, Chronic  (ICD-724.5) 2)  Shoulder Pain, Right  (ICD-719.41) 3)  Ankle Pain  (ICD-719.47) 4)  Acute Sinusitis, Unspecified  (ICD-461.9) 5)  Overweight  (ICD-278.02) 6)  Actinic Keratosis  (ICD-702.0) 7)  Urinary Frequency, Chronic  (ICD-788.41) 8)  Pruritus  (ICD-698.9) 9)  Closed Fracture of One or More Phalanges of Foot  (ICD-826.0) 10)  Black Eye, Not Otherwise Specified  (ICD-921.0) 11)  Restless Legs Syndrome  (ICD-333.94) 12)  Obstructive Sleep Apnea  (ICD-327.23) 13)  Osteoarthritis  (ICD-715.90) 14)  Hypertension  (ICD-401.9) 15)  Diabetes Mellitus, Type II   (ICD-250.00)  Current Problems (verified): 1)  Back Pain, Chronic  (ICD-724.5) 2)  Shoulder Pain, Right  (ICD-719.41) 3)  Ankle Pain  (ICD-719.47) 4)  Acute Sinusitis, Unspecified  (ICD-461.9) 5)  Overweight  (ICD-278.02) 6)  Actinic Keratosis  (ICD-702.0) 7)  Urinary Frequency, Chronic  (ICD-788.41) 8)  Pruritus  (ICD-698.9) 9)  Closed Fracture of One or More Phalanges of Foot  (ICD-826.0) 10)  Black Eye, Not Otherwise Specified  (ICD-921.0) 11)  Restless Legs Syndrome  (ICD-333.94) 12)  Obstructive Sleep Apnea  (ICD-327.23) 13)  Osteoarthritis  (ICD-715.90) 14)  Hypertension  (ICD-401.9) 15)  Diabetes Mellitus, Type II  (ICD-250.00)  Medications Prior to Update: 1)  Lisinopril-Hydrochlorothiazide 20-25 Mg  Tabs (Lisinopril-Hydrochlorothiazide) .... Once Daily 2)  Detrol La 4 Mg  Cp24 (Tolterodine Tartrate) .Marland Kitchen.. 1 Once Daily 3)  Amlodipine Besylate 10 Mg Tabs (Amlodipine Besylate) .Marland Kitchen.. 1 Once Daily 4)  Mirapex 0.25 Mg  Tabs (Pramipexole Dihydrochloride) .... Take 1 Tab By Mouth At Bedtime 5)  Freestyle Lite   Strp (Glucose Blood) .... Use To Check Fingerstick Glucoses Bid 6)  Metformin Hcl 500 Mg Xr24h-Tab (Metformin Hcl) .... Three By Mouth At [facility]  Current Medications (verified): 1)  Lisinopril-Hydrochlorothiazide 20-25 Mg  Tabs (Lisinopril-Hydrochlorothiazide) .... Once Daily 2)  Detrol La 4 Mg  Cp24 (Tolterodine Tartrate) .Marland Kitchen.. 1 Once Daily 3)  Amlodipine Besylate 10 Mg Tabs (Amlodipine Besylate) .Marland KitchenMarland KitchenMarland Kitchen 1  Once Daily 4)  Mirapex 0.25 Mg  Tabs (Pramipexole Dihydrochloride) .... Take 1 Tab By Mouth At Bedtime 5)  Freestyle Lite   Strp (Glucose Blood) .... Use To Check Fingerstick Glucoses Bid 6)  Metformin Hcl 500 Mg Xr24h-Tab (Metformin Hcl) .... Three By Mouth At [facility]  Allergies (verified): 1)  ! Sulfa  Past History:  Social History: Last updated: 01/27/2007 Married Never Smoked Alcohol use-no  Risk Factors: Smoking Status: never (02/14/2010)  Past medical, surgical,  family and social histories (including risk factors) reviewed, and no changes noted (except as noted below).  Past Medical History: Reviewed history from 01/27/2007 and no changes required. Diabetes mellitus, type II Hypertension Osteoarthritis  Past Surgical History: Reviewed history from 01/27/2007 and no changes required. Flexible Sigmoidoscopy Perirectal abscess drainage Appendectomy  Family History: Reviewed history and no changes required.  Social History: Reviewed history from 01/27/2007 and no changes required. Married Never Smoked Alcohol use-no  Review of Systems  The patient denies anorexia, fever, weight loss, weight gain, vision loss, decreased hearing, hoarseness, chest pain, syncope, dyspnea on exertion, peripheral edema, prolonged cough, headaches, hemoptysis, abdominal pain, melena, hematochezia, severe indigestion/heartburn, hematuria, incontinence, genital sores, muscle weakness, suspicious skin lesions, transient blindness, difficulty walking, depression, unusual weight change, abnormal bleeding, enlarged lymph nodes, angioedema, and breast masses.         Flu Vaccine Consent Questions     Do you have a history of severe allergic reactions to this vaccine? no    Any prior history of allergic reactions to egg and/or gelatin? no    Do you have a sensitivity to the preservative Thimersol? no    Do you have a past history of Guillan-Barre Syndrome? no    Do you currently have an acute febrile illness? no    Have you ever had a severe reaction to latex? no    Vaccine information given and explained to patient? yes    Are you currently pregnant? no    Lot Number:[id number]   Exp Date:11/15/2010   Site Given  Left Deltoid IM   Physical Exam  General:  alert and overweight-appearing.   Head:  normocephalic and no abnormalities observed.   Eyes:  pupils equal and pupils round.   Ears:  R ear normal and L ear normal.   Lungs:  normal respiratory effort and no  wheezes.   Heart:  normal rate and regular rhythm.   Abdomen:  soft and normal bowel sounds.   Msk:  normal ROM and no joint tenderness.   Extremities:  trace left pedal edema and trace right pedal edema.   Neurologic:  alert & oriented X3 and strength normal in all extremities.    Diabetes Management Exam:    Foot Exam (with socks and/or shoes not present):       Sensory-Pinprick/Light touch:          Left medial foot (L-4): diminished          Left dorsal foot (L-5): diminished          Left lateral foot (S-1): diminished          Right medial foot (L-4): diminished          Right dorsal foot (L-5): diminished          Right lateral foot (S-1): diminished    Eye Exam:       Eye Exam done elsewhere          Date: 07/18/2009  Results: normal          Done by: eye center   Impression & Recommendations:  Problem # 1:  HYPERTENSION (ICD-401.9) Assessment Unchanged  Td Booster: Historical (05/18/2005)   Flu Vax: Fluvax 3+ (02/14/2010)   Chol: 150 (03/16/2008)   HDL: 34.8 (03/16/2008)   LDL: 102 (03/16/2008)   TG: 64 (03/16/2008) TSH: 1.64 (10/20/2006)   HgbA1C: 7.5 (08/30/2009)    Discussed using sunscreen, use of alcohol, drug use, self breast exam, routine dental care, routine eye care, schedule for GYN exam, routine physical exam, seat belts, multiple vitamins, osteoporosis prevention, adequate calcium intake in diet, recommendations for immunizations, mammograms and Pap smears.  Discussed exercise and checking cholesterol.  Discussed gun safety, safe sex, and contraception.  Orders: Radiology Referral (Radiology)  Her updated medication list for this problem includes:    Lisinopril-hydrochlorothiazide 20-25 Mg Tabs (Lisinopril-hydrochlorothiazide) ..... Once daily    Amlodipine Besylate 10 Mg Tabs (Amlodipine besylate) .Marland Kitchen... 1 once daily  BP today: 140/80 Prior BP: 140/80 (08/30/2009)  Prior 10 Yr Risk Heart Disease: > 32 % (03/07/2009)  Labs Reviewed: K+: 3.4  (06/07/2009) Creat: : 0.6 (06/07/2009)   Chol: 150 (03/16/2008)   HDL: 34.8 (03/16/2008)   LDL: 102 (03/16/2008)   TG: 64 (03/16/2008)  Problem # 2:  OBSTRUCTIVE SLEEP APNEA (ICD-327.23) not wearing the cpap very non adherant  Problem # 3:  DIABETES MELLITUS, TYPE II (ICD-250.00) again she intermitantly takes her dz seriously Her updated medication list for this problem includes:    Lisinopril-hydrochlorothiazide 20-25 Mg Tabs (Lisinopril-hydrochlorothiazide) ..... Once daily    Metformin Hcl 500 Mg Xr24h-tab (Metformin hcl) .Marland Kitchen... Three by mouth at hs  Orders: Venipuncture (16109) T-Fluid for LDH (60454-09811) Specimen Handling (91478) TLB-BMP (Basic Metabolic Panel-BMET) (80048-METABOL) TLB-A1C / Hgb A1C (Glycohemoglobin) (83036-A1C) TLB-Cholesterol, Direct LDL (83721-DIRLDL)  Labs Reviewed: Creat: 0.6 (06/07/2009)     Last Eye Exam: normal (07/18/2009) Reviewed HgBA1c results: 7.5 (08/30/2009)  7.3 (06/07/2009)  Problem # 4:  ANKLE SPRAIN, LEFT (ICD-845.00) this is a recurrent injury wear a brace for 3 additonal weeks  Instructed to use a compression wrap, elevate the affected area, apply ICE for 20 minutes every hour while awake for next 3 days, and rest. Start physical therapy as directed and recheck in 10-14 days if no improvement, sooner if worse.  Complete Medication List: 1)  Lisinopril-hydrochlorothiazide 20-25 Mg Tabs (Lisinopril-hydrochlorothiazide) .... Once daily 2)  Detrol La 4 Mg Cp24 (Tolterodine tartrate) .Marland Kitchen.. 1 once daily 3)  Amlodipine Besylate 10 Mg Tabs (Amlodipine besylate) .Marland Kitchen.. 1 once daily 4)  Mirapex 0.25 Mg Tabs (Pramipexole dihydrochloride) .... Take 1 tab by mouth at bedtime 5)  Freestyle Lite Strp (Glucose blood) .... Use to check fingerstick glucoses bid 6)  Metformin Hcl 500 Mg Xr24h-tab (Metformin hcl) .... Three by mouth at hs  Other Orders: Admin 1st Vaccine (29562) Flu Vaccine 69yrs + (13086)  Hypertension Assessment/Plan:      The  patient's hypertensive risk group is category C: Target organ damage and/or diabetes.  Her calculated 10 year risk of coronary heart disease is > 32 %.  Today's blood pressure is 140/80.  Her blood pressure goal is < 130/80.  Patient Instructions: 1)  Please schedule a follow-up appointment in 3 months.

## 2010-06-19 NOTE — Progress Notes (Signed)
Summary: needs info  Phone Note Call from Patient Call back at Home Phone [phone]   Caller: Patient--live call Reason for Call: Acute Illness Summary of Call: need to know dates of sleep study and cardic testing and the names of the doctors that were involved. please return her call if any questions. Initial call taken by: Warnell Forester,  April 04, 2010 11:07 AM  Follow-up for Phone Call        informastion given - no cardiac work up found- pt c/o dyspnea and some chest pian- getting ready for breast cancer surgery with possible radiastion and c hemo- per dr Lovell Sheehan- get stress echo-pt informed and order sent to terri Follow-up by: Willy Eddy, LPN,  April 04, 2010 11:18 AM  New Problems: BREAST CANCER (ICD-174.9) CHEST PAIN (ICD-786.50) DYSPNEA (ICD-786.05)   New Problems: BREAST CANCER (ICD-174.9) CHEST PAIN (ICD-786.50) DYSPNEA (ICD-786.05)

## 2010-06-19 NOTE — Assessment & Plan Note (Signed)
Summary: CONTINUED R ANKLE PAIN // RS   Vital Signs:  Patient profile:   70 year old female Height:      62 inches Weight:      197 pounds BMI:     36.16 Temp:     98.2 degrees F oral Pulse rate:   72 / minute Resp:     14 per minute BP sitting:   144 / 80  (left arm)  Vitals Entered By: Willy Eddy, LPN (July 23, 1608 4:23 PM) CC: c/o rt ankle, Hypertension Management   CC:  c/o rt ankle and Hypertension Management.  History of Present Illness: The pt had and injury to  right shoulder with increased pain in  shoulder and requests xray had injury to the ankle ( sprain) and had xrays which showed DJD and no fractures pain is improved Her DM is stable and HTN is in reasonable control for age and diagnoses  Hypertension History:      She denies headache, chest pain, palpitations, dyspnea with exertion, orthopnea, PND, peripheral edema, visual symptoms, neurologic problems, syncope, and side effects from treatment.        Positive major cardiovascular risk factors include female age 3 years old or older, diabetes, and hypertension.  Negative major cardiovascular risk factors include non-tobacco-user status.     Preventive Screening-Counseling & Management  Alcohol-Tobacco     Smoking Status: never  Problems Prior to Update: 1)  Shoulder Pain, Right  (ICD-719.41) 2)  Ankle Pain  (ICD-719.47) 3)  Acute Sinusitis, Unspecified  (ICD-461.9) 4)  Overweight  (ICD-278.02) 5)  Actinic Keratosis  (ICD-702.0) 6)  Urinary Frequency, Chronic  (ICD-788.41) 7)  Pruritus  (ICD-698.9) 8)  Closed Fracture of One or More Phalanges of Foot  (ICD-826.0) 9)  Black Eye, Not Otherwise Specified  (ICD-921.0) 10)  Restless Legs Syndrome  (ICD-333.94) 11)  Obstructive Sleep Apnea  (ICD-327.23) 12)  Osteoarthritis  (ICD-715.90) 13)  Hypertension  (ICD-401.9) 14)  Diabetes Mellitus, Type II  (ICD-250.00)  Current Problems (verified): 1)  Ankle Pain  (ICD-719.47) 2)  Acute Sinusitis,  Unspecified  (ICD-461.9) 3)  Overweight  (ICD-278.02) 4)  Actinic Keratosis  (ICD-702.0) 5)  Urinary Frequency, Chronic  (ICD-788.41) 6)  Pruritus  (ICD-698.9) 7)  Closed Fracture of One or More Phalanges of Foot  (ICD-826.0) 8)  Black Eye, Not Otherwise Specified  (ICD-921.0) 9)  Restless Legs Syndrome  (ICD-333.94) 10)  Obstructive Sleep Apnea  (ICD-327.23) 11)  Osteoarthritis  (ICD-715.90) 12)  Hypertension  (ICD-401.9) 13)  Diabetes Mellitus, Type II  (ICD-250.00)  Medications Prior to Update: 1)  Lisinopril-Hydrochlorothiazide 20-25 Mg  Tabs (Lisinopril-Hydrochlorothiazide) .... Once Daily 2)  Detrol La 4 Mg  Cp24 (Tolterodine Tartrate) .Marland Kitchen.. 1 Once Daily 3)  Amlodipine Besylate 10 Mg Tabs (Amlodipine Besylate) .Marland Kitchen.. 1 Once Daily 4)  Mirapex 0.25 Mg  Tabs (Pramipexole Dihydrochloride) .... Take 1 Tab By Mouth At Bedtime 5)  Freestyle Lite   Strp (Glucose Blood) .... Use To Check Fingerstick Glucoses Bid 6)  Metformin Hcl 500 Mg Xr24h-Tab (Metformin Hcl) .... Three [facility] Mouth At [facility]  Current Medications (verified): 1)  Lisinopril-Hydrochlorothiazide 20-25 Mg  Tabs (Lisinopril-Hydrochlorothiazide) .... Once Daily 2)  Detrol La 4 Mg  Cp24 (Tolterodine Tartrate) .Marland Kitchen.. 1 Once Daily 3)  Amlodipine Besylate 10 Mg Tabs (Amlodipine Besylate) .Marland Kitchen.. 1 Once Daily 4)  Mirapex 0.25 Mg  Tabs (Pramipexole Dihydrochloride) .... Take 1 Tab By Mouth At Bedtime 5)  Freestyle Lite   Strp (Glucose Blood) .... Use  To Check Fingerstick Glucoses Bid 6)  Metformin Hcl 500 Mg Xr24h-Tab (Metformin Hcl) .... Three By Mouth At [facility]  Allergies (verified): 1)  ! Sulfa  Past History:  Social History: Last updated: 01/27/2007 Married Never Smoked Alcohol use-no  Risk Factors: Smoking Status: never (07/23/2009)  Past medical, surgical, family and social histories (including risk factors) reviewed, and no changes noted (except as noted below).  Past Medical History: Reviewed history from 01/27/2007 and no  changes required. Diabetes mellitus, type II Hypertension Osteoarthritis  Past Surgical History: Reviewed history from 01/27/2007 and no changes required. Flexible Sigmoidoscopy Perirectal abscess drainage Appendectomy  Family History: Reviewed history and no changes required.  Social History: Reviewed history from 01/27/2007 and no changes required. Married Never Smoked Alcohol use-no  Review of Systems  The patient denies anorexia, fever, weight loss, weight gain, vision loss, decreased hearing, hoarseness, chest pain, syncope, dyspnea on exertion, peripheral edema, prolonged cough, headaches, hemoptysis, abdominal pain, melena, hematochezia, severe indigestion/heartburn, hematuria, incontinence, genital sores, muscle weakness, suspicious skin lesions, transient blindness, difficulty walking, depression, unusual weight change, abnormal bleeding, enlarged lymph nodes, angioedema, and breast masses.         ankle pain  Physical Exam  General:  Well-developed,well-nourished,in no acute distress; alert,appropriate and cooperative throughout examination Head:  Normocephalic and atraumatic without obvious abnormalities. No apparent alopecia or balding. Eyes:  No corneal or conjunctival inflammation noted. EOMI. Perrla. Funduscopic exam benign, without hemorrhages, exudates or papilledema. Vision grossly normal. Nose:  no external deformity and no nasal discharge.   Lungs:  normal respiratory effort and no intercostal retractions.   Heart:  normal rate and regular rhythm.   Abdomen:  Bowel sounds positive,abdomen soft and non-tender without masses, organomegaly or hernias noted.   Impression & Recommendations:  Problem # 1:  ANKLE PAIN (ICD-719.47)  jackson prat wrap and immobilize for 1 week after shot  Orders: Joint Aspirate / Injection, Large (20610) Depo- Medrol 40mg  (J1030)  Problem # 2:  SHOULDER PAIN, RIGHT (ICD-719.41) chronic shouler pain Orders: T-Shoulder Right  (73030TC)  Complete Medication List: 1)  Lisinopril-hydrochlorothiazide 20-25 Mg Tabs (Lisinopril-hydrochlorothiazide) .... Once daily 2)  Detrol La 4 Mg Cp24 (Tolterodine tartrate) .Marland Kitchen.. 1 once daily 3)  Amlodipine Besylate 10 Mg Tabs (Amlodipine besylate) .Marland Kitchen.. 1 once daily 4)  Mirapex 0.25 Mg Tabs (Pramipexole dihydrochloride) .... Take 1 tab by mouth at bedtime 5)  Freestyle Lite Strp (Glucose blood) .... Use to check fingerstick glucoses bid 6)  Metformin Hcl 500 Mg Xr24h-tab (Metformin hcl) .... Three by mouth at hs  Hypertension Assessment/Plan:      The patient's hypertensive risk group is category C: Target organ damage and/or diabetes.  Her calculated 10 year risk of coronary heart disease is > 32 %.  Today's blood pressure is 144/80.  Her blood pressure goal is < 130/80.  Patient Instructions: 1)  refer for xray

## 2010-06-20 ENCOUNTER — Ambulatory Visit: Payer: Medicare HMO | Attending: Radiation Oncology | Admitting: Radiation Oncology

## 2010-06-20 ENCOUNTER — Ambulatory Visit: Payer: Self-pay | Admitting: Radiation Oncology

## 2010-06-25 ENCOUNTER — Telehealth: Payer: Self-pay | Admitting: *Deleted

## 2010-06-25 DIAGNOSIS — M79605 Pain in left leg: Secondary | ICD-10-CM

## 2010-06-25 NOTE — Telephone Encounter (Signed)
Pain in hips and legs. Seeing Dr. [name] and Welton Flakes for lumpectomy and oncologist, and radiation. Wants RX for pain.Is taking  Hydrocodone 5/500 mg. But has not taken it yet.  Doxycycline, Anastrozole. Others are meds Dr. Lovell Sheehan has her on.

## 2010-06-25 NOTE — Telephone Encounter (Signed)
Take the hydrocodone

## 2010-06-26 MED ORDER — HYDROCODONE-ACETAMINOPHEN 7.5-500 MG PO TABS: 1.0000 | ORAL_TABLET | Freq: Four times a day (QID) | ORAL | Status: DC | PRN

## 2010-06-26 NOTE — Telephone Encounter (Signed)
Notified pt. To take hydrocodone and called to [facility]

## 2010-07-29 LAB — BASIC METABOLIC PANEL
BUN: 13 mg/dL (ref 6–23)
CO2: 32 mEq/L (ref 19–32)
Calcium: 9.4 mg/dL (ref 8.4–10.5)
Chloride: 99 mEq/L (ref 96–112)

## 2010-07-29 LAB — GLUCOSE, CAPILLARY
Glucose-Capillary: 134 mg/dL — ABNORMAL HIGH (ref 70–99)
Glucose-Capillary: 149 mg/dL — ABNORMAL HIGH (ref 70–99)

## 2010-08-25 ENCOUNTER — Telehealth: Payer: Self-pay | Admitting: Internal Medicine

## 2010-08-25 DIAGNOSIS — M79605 Pain in left leg: Secondary | ICD-10-CM

## 2010-08-25 MED ORDER — HYDROCODONE-ACETAMINOPHEN 7.5-500 MG PO TABS: 1.0000 | ORAL_TABLET | Freq: Four times a day (QID) | ORAL | Status: DC | PRN

## 2010-08-25 NOTE — Telephone Encounter (Signed)
Per Dr Cato Mulligan, ok to call in

## 2010-08-25 NOTE — Telephone Encounter (Addendum)
Pt req script for Hydrododone 5-500 mg. Pls call in to [facility] on [address] [phone]. Pt also is req to get an appt with Dr Lovell Sheehan re: various issues and pain in leg and back. Pls advise.

## 2010-09-12 ENCOUNTER — Other Ambulatory Visit: Payer: Self-pay | Admitting: Internal Medicine

## 2010-09-29 ENCOUNTER — Other Ambulatory Visit: Payer: Self-pay | Admitting: Oncology

## 2010-09-29 ENCOUNTER — Encounter (HOSPITAL_BASED_OUTPATIENT_CLINIC_OR_DEPARTMENT_OTHER): Payer: Medicare HMO | Admitting: Oncology

## 2010-09-29 DIAGNOSIS — C50119 Malignant neoplasm of central portion of unspecified female breast: Secondary | ICD-10-CM

## 2010-09-29 LAB — CBC WITH DIFFERENTIAL/PLATELET
BASO%: 1 % (ref 0.0–2.0)
HCT: 41.4 % (ref 34.8–46.6)
MCHC: 33.9 g/dL (ref 31.5–36.0)
MONO#: 0.5 10*3/uL (ref 0.1–0.9)
RBC: 4.44 10*6/uL (ref 3.70–5.45)
RDW: 13.4 % (ref 11.2–14.5)
WBC: 8.4 10*3/uL (ref 3.9–10.3)
lymph#: 1.8 10*3/uL (ref 0.9–3.3)

## 2010-09-30 ENCOUNTER — Encounter: Payer: Self-pay | Admitting: Internal Medicine

## 2010-09-30 LAB — COMPREHENSIVE METABOLIC PANEL
ALT: 19 U/L (ref 0–35)
CO2: 27 mEq/L (ref 19–32)
Calcium: 9.4 mg/dL (ref 8.4–10.5)
Chloride: 99 mEq/L (ref 96–112)
Potassium: 3.6 mEq/L (ref 3.5–5.3)
Sodium: 139 mEq/L (ref 135–145)
Total Protein: 7.3 g/dL (ref 6.0–8.3)

## 2010-09-30 LAB — VITAMIN D 25 HYDROXY (VIT D DEFICIENCY, FRACTURES): Vit D, 25-Hydroxy: 45 ng/mL (ref 30–89)

## 2010-09-30 NOTE — Assessment & Plan Note (Signed)
[facility]                             PULMONARY OFFICE NOTE   NAME:[name], [name]                        MRN:          [id number]  DATE:03/10/2007                            DOB:          03-Aug-1940    HISTORY OF PRESENT ILLNESS:  Patient is a 70 year old female who I have  been asked to see for obstructive sleep apnea.  The patient recently  underwent nocturnal polysomnography where she was found to have mild to  moderate obstructive sleep apnea with an apnea-hypopnea index of 18  events per hours and an O2 desaturation as low as 76%.  She had  occasional PVCs and noted significant leg jerks.  Patient states that  her husband feels that she does not snore and has noted no pauses in her  breathing during sleep, she also denies choking arousals.  She typically  goes to bed between 11:30 and 12:00 and gets up between and 8:00 and  10:00 to start her day.  She is not overly rested in the mornings upon  arising.  The patient also states that she will awaken at least 4-6  times a night with her legs being uncomfortable and it takes  approximately 1 hour after standing and walking around in order for her  legs to improve so that she can go back to sleep.  She does complain of  an unusual feeling in her legs in the early evening while watching TV  which is better with movement.  Patient however also has chronic back  pain and feels that this may be her difficulties with her legs.  The  patient also dozes very easily during the day with periods of  inactivity.  She has no problems with short drives but does get sleepy  if she tries to drive long distances and therefore tries to stay away  from this.   PAST MEDICAL HISTORY:  1. Hypertension.  2. Diabetes.  3. Asthma.  4. Status post appendectomy.  5. Status post tubal ligation.  6. History of knee replacement.   CURRENT MEDICATIONS:  1. Detrol LA 4 mg daily.  2. Norvasc 10 mg daily.  3. Lisinopril  HCTZ 20/25 one daily.  4. Robaxin 500 mg t.i.d.  5. Percocet 7.5/325 one to two nightly.   PATIENT HAS ALLERGY TO SULFA.   SOCIAL HISTORY:  She has never smoked.  She is married.   FAMILY HISTORY:  Remarkable for her sister having breast cancer,  otherwise noncontributory in first degree relatives.   REVIEW OF SYSTEMS:  As per history of present illness, also see patient  intake form documented in the chart.   PHYSICAL EXAMINATION:  GENERAL:  Overweight female in no acute distress.  Blood pressure is 138/86, pulse 64, temperature is 97.5, weight is 190  pounds, she is 5 foot 2-1/2 inches tall, O2 saturation on room air is  94%.  HEENT:  Pupils equal, round, reactive to light and accommodation;  extraocular movements intact.  Nares shows deviated septum to the left  with near occlusion, oropharynx with mild elongation of soft palate and  uvula.  NECK:  Supple without JVD or lymphadenopathy, there is no palpitations  thyromegaly.  CHEST:  Totally clear.  CARDIAC:  Reveals regular rate and rhythm; no murmurs, rubs or gallops.  ABDOMEN:  Soft, nontender with good bowel sounds.  Genital exam, rectal exam, breast exam was not done and not indicated.  LOWER EXTREMITY:  Shows trace edema, pulses are intact distally.  NEUROLOGICALLY:  Alert and oriented with no obvious motor deficits.   IMPRESSION:  1. Mild to moderate obstructive sleep apnea documented by nocturnal      polysomnography.  The patient clearly has significant sleep      disruption during the night and does not have restorative sleep.      She does have sleepiness with periods of inactivity.  I do think      that she would benefit from either upper airway surgery, oral      appliance, or possibly continuous positive airway pressure.  She      also needs to work on weight loss.  Patient is willing to give      continuous positive airway pressure a try at this point in time.  2. Questionable restless leg syndrome.  The  patient has a history that      is consistent with this, however also has chronic back pain that      may contribute to this.  I do think that it is worth a try of a      dopamine agonist to see if she has improvement in her      symptomatology.   PLAN:  1. Initiate CPAP at 10 cm and the patient is to work on weight loss.      Trial of Mirapex in an escalating fashion to 0.25 mg.  2. The patient will follow up in 8 weeks or sooner if there is      problems.     Barbaraann Share, MD,FCCP  Electronically Signed    [facility]  DD: 03/21/2007  DT: 03/21/2007  Job #: [id number]   cc:   Stacie Glaze, MD

## 2010-09-30 NOTE — Procedures (Signed)
NAMEJAYLA, [name] NO.:  [id number]   MEDICAL RECORD NO.:  [id number]          PATIENT TYPE:  OUT   LOCATION:  SLEEP CENTER                 FACILITY:  [facility]   PHYSICIAN:  Barbaraann Share, MD,FCCPDATE OF BIRTH:  Aug 28[birth year] 1938   DATE OF STUDY:  02/09/2007                            NOCTURNAL POLYSOMNOGRAM   REFERRING PHYSICIAN:  Stacie Glaze, MD   INDICATION FOR STUDY:  Hypersomnia with sleep apnea.   EPWORTH SLEEPINESS SCORE:  Twenty.   MEDICATIONS:   SLEEP ARCHITECTURE:  The patient had a total sleep time of 274 minutes  with no slow wave sleep and only 25 minutes of REM.  Sleep onset latency  was rapid at 4.5 minutes and REM onset was prolonged at 177 minutes.  Sleep efficiency was significantly decreased at 67%.   RESPIRATORY DATA:  The patient was found to have 78 obstructive  hypopneas and 4 obstructive apneas for an apnea/hypopnea index of 18  events per hour.  The events were not positional but there was mild to  moderate snoring noted throughout.   OXYGEN DATA:  There was O2 desaturation as low as 76% during the  patient's obstructive events occurring during REM.   CARDIAC DATA:  Occasional PVCs but no clinically significant arrhythmias  were noted.   MOVEMENT-PARASOMNIA:  Small numbers of leg jerks with no significant  sleep disruption.   IMPRESSIONS-RECOMMENDATIONS:  Mild obstructive sleep apnea/hypopnea  syndrome with an apnea/hypopnea index of 18 events per hour and oxygen  desaturation as low as 76%.  The patient did have very little slow wave  sleep or REM and therefore her degree of sleep apnea may be under  estimated.  Treatment for this degree  of sleep apnea can include weight loss alone if applicable, upper airway  surgery, oral appliance, and also CPAP.  The aggressiveness of treatment  should be based upon how much this is impacting the patient's quality-of-  life.      Barbaraann Share, MD,FCCP  Diplomate, American Board of  Sleep  Medicine  Electronically Signed     [facility]/MEDQ  D:  02/24/2007 08:55:11  T:  02/24/2007 12:20:36  Job:  [id number]

## 2010-10-03 ENCOUNTER — Encounter: Payer: Self-pay | Admitting: Internal Medicine

## 2010-10-03 ENCOUNTER — Ambulatory Visit (INDEPENDENT_AMBULATORY_CARE_PROVIDER_SITE_OTHER): Payer: Medicare HMO | Admitting: Internal Medicine

## 2010-10-03 VITALS — BP 140/90 | HR 84 | Temp 98.2°F | Resp 16 | Ht 62.0 in | Wt 198.0 lb

## 2010-10-03 DIAGNOSIS — IMO0002 Reserved for concepts with insufficient information to code with codable children: Secondary | ICD-10-CM

## 2010-10-03 DIAGNOSIS — E119 Type 2 diabetes mellitus without complications: Secondary | ICD-10-CM

## 2010-10-03 DIAGNOSIS — I10 Essential (primary) hypertension: Secondary | ICD-10-CM

## 2010-10-03 DIAGNOSIS — N3281 Overactive bladder: Secondary | ICD-10-CM

## 2010-10-03 DIAGNOSIS — M1990 Unspecified osteoarthritis, unspecified site: Secondary | ICD-10-CM

## 2010-10-03 DIAGNOSIS — M79605 Pain in left leg: Secondary | ICD-10-CM

## 2010-10-03 DIAGNOSIS — M1710 Unilateral primary osteoarthritis, unspecified knee: Secondary | ICD-10-CM

## 2010-10-03 LAB — BASIC METABOLIC PANEL
CO2: 27 mEq/L (ref 19–32)
Calcium: 9.6 mg/dL (ref 8.4–10.5)
Chloride: 99 mEq/L (ref 96–112)
Creat: 0.9 mg/dL (ref 0.40–1.20)
Glucose, Bld: 176 mg/dL — ABNORMAL HIGH (ref 70–99)

## 2010-10-03 MED ORDER — HYDROCODONE-ACETAMINOPHEN 7.5-500 MG PO TABS: 1.0000 | ORAL_TABLET | Freq: Four times a day (QID) | ORAL | Status: DC | PRN

## 2010-10-03 MED ORDER — TOLTERODINE TARTRATE ER 4 MG PO CP24: 4.0000 mg | ORAL_CAPSULE | Freq: Every day | ORAL | Status: DC

## 2010-10-03 MED ORDER — LISINOPRIL-HYDROCHLOROTHIAZIDE 20-25 MG PO TABS: 1.0000 | ORAL_TABLET | Freq: Every day | ORAL | Status: DC

## 2010-10-03 MED ORDER — AMLODIPINE BESYLATE 10 MG PO TABS: 10.0000 mg | ORAL_TABLET | Freq: Every day | ORAL | Status: DC

## 2010-10-03 MED ORDER — GLUCOSE BLOOD VI STRP: ORAL_STRIP | Status: DC

## 2010-10-03 MED ORDER — PRAMIPEXOLE DIHYDROCHLORIDE 0.5 MG PO TABS: 0.2500 mg | ORAL_TABLET | Freq: Every day | ORAL | Status: DC

## 2010-10-03 MED ORDER — PRAMIPEXOLE DIHYDROCHLORIDE 0.5 MG PO TABS: 0.5000 mg | ORAL_TABLET | Freq: Every day | ORAL | Status: DC

## 2010-10-03 MED ORDER — LORAZEPAM 1 MG PO TABS: 1.0000 mg | ORAL_TABLET | Freq: Three times a day (TID) | ORAL | Status: DC | PRN

## 2010-10-03 NOTE — Progress Notes (Signed)
  Subjective:    Patient ID: [name], female    DOB: 11/11/1940, 70 y.o.   MRN: [id number]  HPI patient is a 70 year old female who recently had breast cancer now completed radiation therapy.  She is followed here for diabetes hypertension and restless leg syndrome.  She states that her restless leg syndrome has worsened and that she is unable to keep her legs steady at night she is only on a small dose of the Requip. 0.25 mg. We discussed titrating the medication with her and its potential side effects.  She states that her blood sugars have not been monitored recently as she is out of glucose strips but she states that she has not had any hypo-or hyperglycemia    Review of Systems  Constitutional: Negative for activity change, appetite change and fatigue.  HENT: Negative for ear pain, congestion, neck pain, postnasal drip and sinus pressure.   Eyes: Negative for redness and visual disturbance.  Respiratory: Negative for cough, shortness of breath and wheezing.   Gastrointestinal: Negative for abdominal pain and abdominal distention.  Genitourinary: Negative for dysuria, frequency and menstrual problem.  Musculoskeletal: Negative for myalgias, joint swelling and arthralgias.  Skin: Negative for rash and wound.  Neurological: Negative for dizziness, weakness and headaches.  Hematological: Negative for adenopathy. Does not bruise/bleed easily.  Psychiatric/Behavioral: Negative for sleep disturbance and decreased concentration.       Objective:   Physical Exam  Constitutional: She is oriented to person, place, and time. She appears well-developed and well-nourished. No distress.  HENT:  Head: Normocephalic and atraumatic.  Right Ear: External ear normal.  Left Ear: External ear normal.  Nose: Nose normal.  Mouth/Throat: Oropharynx is clear and moist.  Eyes: Conjunctivae and EOM are normal. Pupils are equal, round, and reactive to light.  Neck: Normal range of motion. Neck  supple. No JVD present. No tracheal deviation present. No thyromegaly present.  Cardiovascular: Normal rate, regular rhythm, normal heart sounds and intact distal pulses.   No murmur heard. Pulmonary/Chest: Effort normal and breath sounds normal. She has no wheezes. She exhibits no tenderness.  Abdominal: Soft. Bowel sounds are normal.  Musculoskeletal: Normal range of motion. She exhibits no edema and no tenderness.  Lymphadenopathy:    She has no cervical adenopathy.  Neurological: She is alert and oriented to person, place, and time. She has normal reflexes. No cranial nerve deficit.  Skin: Skin is warm and dry. She is not diaphoretic.  Psychiatric: She has a normal mood and affect. Her behavior is normal.          Assessment & Plan:  We'll measure her hemoglobin A1c to monitor her diabetes and have sent a prescription for her glucose glucometer strips to her pharmacy.  She is traveling this summer and we have repeated all of her prescription so she'll have her medications while she travels.  Her blood pressure is stable.  We will increase her Requip 2.5 mg for restless legs and we will refill her hydrocodone for severe leg pain when it occurs her use currently is less than one pill per day

## 2010-10-03 NOTE — Assessment & Plan Note (Signed)
After radiation will moniter the a1c The pt has been out of test strips

## 2010-10-03 NOTE — Op Note (Signed)
NAMEDEBORH, PENSE                 ACCOUNT NO.:  [id number]   MEDICAL RECORD NO.:  [id number]          PATIENT TYPE:  INP   LOCATION:  0002                         FACILITY:  [facility]   PHYSICIAN:  Ollen Gross, M.D.    DATE OF BIRTH:  05/16/1941   DATE OF PROCEDURE:  06/19/2005  DATE OF DISCHARGE:                                 OPERATIVE REPORT   PREOPERATIVE DIAGNOSIS:  Osteoarthritis right knee.   POSTOPERATIVE DIAGNOSIS:  Osteoarthritis right knee.   PROCEDURE:  Right total knee arthroplasty.   SURGEON:  Ollen Gross, M.D.   ASSISTANT:  Avel Peace, PA-C.   ANESTHESIA:  Spinal.   ESTIMATED BLOOD LOSS:  Minimal.   DRAINS:  Hemovac x1.   TOURNIQUET TIME:  39 minutes at 300 mmHg.   COMPLICATIONS:  None.   CONDITION:  Stable to recovery.   BRIEF CLINICAL NOTE:  Ms. Concepcion is a 70 year old female who has end-stage  osteoarthritis of both knees, right more symptomatic than left. She presents  now for right total knee arthroplasty.   PROCEDURE IN DETAIL:  After successful administration of spinal anesthetic,  a tourniquet is placed high on the right thigh and right lower extremity  prepped and draped in the usual sterile fashion. Extremity is wrapped in  Esmarch, knee flexed, tourniquet inflated to 300 mmHg. A midline incision is  made with a 10 blade through the subcutaneous tissue to the level of the  extensor mechanism. A fresh blade is used to make a medial parapatellar  arthrotomy and then the soft tissue over the proximal medial tibia is  subperiosteally elevated to the joint line with the knife and into the  semimembranosus bursa with a Cobb elevator. The soft tissue over the  proximal lateral tibia is also elevated with attention being paid to  avoiding the patellar tendon on the tibial tubercle. The patella is subluxed  laterally, knee flexed 90 degrees, ACL and PCL removed. A drill was used to  create a starting hole in the distal femur and canal was irrigated.  A 5  degree right valgus alignment guide is placed and referencing off the  posterior condyles rotation is marked and the block pinned to remove 10 mm  off the distal femur. Distal femoral resection is made with an oscillating  saw. A sizing block is placed and a size 3 is the most appropriate. The  rotation is marked off the epicondylar axis. A size 3 cutting block is  placed and then the anterior, posterior and chamfer cuts are made.   The tibia is subluxed forward and the menisci are removed. Extramedullary  tibial alignment guide is placed referencing proximally at the medial aspect  of the tibial tubercle and distally along the second metatarsal axis and  tibial crest. The block is pinned to remove 10 mm of the nondeficient  lateral side. Tibial resection is made with an oscillating saw. A size 3 is  the most appropriate tibial component and the proximal tibia is prepared  with the modular drill and keel punch for a size 3. Femoral preparation is  completed with  the intercondylar cut for a size 3.   The size 3 mobile bearing tibial trial with a size 3 posterior stabilized  femoral trial and a 10 mm posterior stabilized rotating platform insert  trial are placed. With the 10, she hyperextended a tiny bit so we went to  12.5 which allowed for full extension with excellent varus and valgus  balance throughout full range of motion. The patella was everted and  thickness measured to be 21 mm. Freehand resection is taken to 11 mm, 38  template is placed, lug holes were drilled, trial patella was placed and it  tracks normally. The osteophytes are then removed off the posterior femur  with the trial in place. All trials were removed and the cut bone surfaces  were prepared with pulsatile lavage. The cement was mixed and once ready for  implantation the size 3 mobile bearing tibial tray, size 3 posterior  stabilized femur and 38 patella were cemented into place and patella was  held with a  clamp. A trial 12.5 mm insert was placed, knee held in full  extension and all extruded cement removed. Once the cement was fully  hardened, then the permanent 12.5 mm posterior stabilized rotating platform  insert is placed into the tibial tray. The wound was copiously irrigated  with saline solution and the extensor mechanism closed over a Hemovac drain  with interrupted #1 PDS. Flexion against gravity was about 135 degrees.  Tourniquets was released with a total time of 39 minutes. Hemovac drain was  hooked to suction. Subcu closed with interrupted 2-0 Vicryl  and  subcuticular with running 4-0 Monocryl. Incision was cleaned and dried and  Steri-Strips and a bulky sterile dressing applied. She was placed into a  knee immobilizer, awakened and transported to recovery in stable condition.      Ollen Gross, M.D.  Electronically Signed     FA/MEDQ  D:  06/19/2005  T:  06/19/2005  Job:  [id number]

## 2010-10-03 NOTE — Discharge Summary (Signed)
[name], [name]                 ACCOUNT NO.:  [id number]   MEDICAL RECORD NO.:  [id number]          PATIENT TYPE:  INP   LOCATION:  1507                         FACILITY:  [facility]   PHYSICIAN:  Ollen Gross, M.D.    DATE OF BIRTH:  01-03-1941   DATE OF ADMISSION:  06/19/2005  DATE OF DISCHARGE:  06/24/2005                                 DISCHARGE SUMMARY   ADMISSION DIAGNOSES:  1.  Osteoarthritis, bilateral knee pain, right more symptomatic and      problematic than the left.  2.  Hypertension.  3.  Hemorrhoids.  4.  History of renal calculi.  5.  Diet-controlled non-insulin-dependent diabetes mellitus.  6.  Arthritis.  7.  Postmenopausal.   DISCHARGE DIAGNOSES:  1.  Osteoarthritis, right knee, status post right total knee arthroplasty.  2.  Leukocytosis.  3.  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  4.  Postoperative hypokalemia, improved.  5.  Hypertension.  6.  Hemorrhoids.  7.  History of renal calculi.  8.  Diet-controlled non-insulin-dependent diabetes mellitus.  9.  Arthritis.  10. Postmenopausal.   PROCEDURE:  On June 19, 2005, right total knee.   SURGEON:  Ollen Gross, M.D.   ASSISTANT:  [name], P.A.-C.   ANESTHESIA:  Spinal.   TOURNIQUET TIME:  Thirty-nine minutes.   CONSULTS:  [facility].   BRIEF HISTORY:  [name] is a 70 year old female with end-stage arthritis  of both knees, right more symptomatic than left.  Now presents for a total  knee arthroplasty.   LABORATORY DATA:  Preop CBC:  Hemoglobin 14.7, hematocrit 43.3, normal white  count at 9.5.  Postop hemoglobin dropped down to 12.2, but the white count  went up to 20.2.  Follow-up CBC:  Hemoglobin down to 11.1.  White count came  down to 18.1.  Last CBC showed a hemoglobin stabilized at 11.4.  White count  had come down further to 14.7.  The admission CBC did have a little bit of a  shift with elevated neutrophils 83, lymphs 8, monos 8, eos 1, baso 0.  Follow-up CBC:  Neutrophils remained slightly elevated at 83, lymphs 6,  monos 10, eos 0, baso 0.  PT/PTT on admission 13.3 and 27, respectively with  an INR of 1.  Serial pro times followed.  Last noted PT/INR 23.4 and 2.1.  Chem panel on admission all within normal limits.  Serial BMETs are  followed.  Sodium dropped postoperatively from 136 down to 133.  Crept  further down to 120.  Came back up to 133.  Last noted at 135.  Potassium  started at normal at 3.8, drifted down to 3.2.  Was on the way back up to  3.3.  Serum osmolality 249, low.  Urine osmolality 243, low.  TSH level  normal at 1.887.  Urine sodium 72, preop UA moderate leukocyte esterase with  only 3-6 white cells, rare bacteria, rare epithelial cells, otherwise  negative.   EKG dated June 16, 2005:  Normal sinus rhythm.  Left axis  deviation.  Minimal voltage criteria for LVH.  Inferior infarct, age undetermined.  Anterolateral infarct, age undetermined.  No previous EKGs.  Confirmed by  Dr. Lady Deutscher.   Two view chest, June 16, 2005:  Elevated left hemidiaphragm.  Heart size  normal.  No overt edema.   Portable chest x-ray on June 20, 2005:  Gastric distention, otherwise  stable in appearance.   Acute abdominal with chest, June 22, 2005:  No free intraperitoneal gas.  Nonobstructive gas pattern.  Scattered atelectasis in the left lung.  Improved gastric distention.   HOSPITAL COURSE:  Patient admitted to [facility], tolerated the  procedure well.  Later transferred to the recovery room and the orthopedic  floor.  [facility] was consulted to assist with medical  management of the patient postoperatively.  Did note to have a little bit of  leukocytosis following surgery and also drop in the sodium.  Chest x-ray was  checked, as above.  He started to get up out of bed.  Hemovac drain was  removed on day #1.   The white count did go up to 20.  He did receive  antibiotics.  By day #2, though, the white count had come down to 18 and  then lower at 14 on the following day.  Had a little bit of low urinary  output to start out with but the urine output improved by day #2.  Actually  encouraged pulmonary toilet due to some atelectasis seen on the abdominal  chest x-ray with the gastric distention.  The hyponatremia worsened further  by day #2 with the sodium dropping down to day #2.  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  Changed over to normal saline.  The patient was afebrile,  despite the leukocytosis.  It actually had improved.  Started to get up with  physical therapy.  Nausea and vomiting, which occurred postoperatively, was  treated with antiemetics.  By day #3, the sodium had come back up to 133.  The potassium was low at 3.2 and given potassium supplements.  White count  had improved.  Dressing was changed on day #2.  It was healing well.  Continued with wound observation throughout the hospital course.   By June 23, 2005, the patient was doing well.  Had initially been placed  on fluid restrictions when the sodium dropped, but the sodium had improved;  therefore, the fluid restrictions were removed.  She was doing much better  by day #4.  The follow-up x-ray of the abdomen, after initially finding  gastric distention, had improved.  Once she was feeling better, the CPM was  resumed for further therapy.  Started getting up with more therapy and  ambulating.  Had only started out slow, was only doing about 20 feet by day  #3 but increased up to 80 feet by day #4.  Was doing so well that by day #5,  sodium improved, potassium improved, tolerating meds and was discharged  home.   DISCHARGE PLAN:  1.  Patient was discharged home on June 24, 2005.  2.  Discharge diagnoses:  Please see above.  3.  Discharge meds:  Coumadin, Percocet, Robaxin. 4.  Activity:  Total knee protocol.  Home  health PT/OT and home health      nursing.  Going to recheck a BMET on Friday through the home health.  5.  Follow up two weeks from surgery.  Call the office for an appointment at  [phone].   DISPOSITION:  Home.   CONDITION ON DISCHARGE:  Improving.      [name], P.A.      Ollen Gross, M.D.  Electronically Signed    ALP/MEDQ  D:  08/04/2005  T:  08/04/2005  Job:  [id number]   cc:   Della Goo, M.D.  Fax: [phone]   [facility]

## 2010-10-03 NOTE — Discharge Summary (Signed)
NAMEJULIETT, [name]                 ACCOUNT NO.:  [id number]   MEDICAL RECORD NO.:  [id number]          PATIENT TYPE:  INP   LOCATION:  1507                         FACILITY:  [facility]   PHYSICIAN:  Alexzandrew L. Perkins, P.A.DATE OF BIRTH:  1940-06-25   DATE OF ADMISSION:  06/19/2005  DATE OF DISCHARGE:  06/24/2005                                 DISCHARGE SUMMARY   Dictation ended here.      Alexzandrew L. Julien Girt, P.A.     ALP/MEDQ  D:  08/03/2005  T:  08/04/2005  Job:  [id number]   cc:   Della Goo, M.D.  Fax: [phone]   [facility]

## 2010-10-03 NOTE — Assessment & Plan Note (Signed)
Severe osteoarthritis of  Knees and feet. Takes hydrocodone 2-3 times a week

## 2010-10-03 NOTE — H&P (Signed)
[name], [name]                 ACCOUNT NO.:  [id number]   MEDICAL RECORD NO.:  [id number]          PATIENT TYPE:  INP   LOCATION:  NA                           FACILITY:  [facility]   PHYSICIAN:  Ollen Gross, M.D.    DATE OF BIRTH:  1940-10-23   DATE OF ADMISSION:  06/19/2005  DATE OF DISCHARGE:                                HISTORY & PHYSICAL   DATE OF OFFICE VISIT HISTORY AND PHYSICAL:  June 18, 2005.   CHIEF COMPLAINT:  Bilateral knee pain.   HISTORY OF PRESENT ILLNESS:  The patient is a 70 year old female seen by Dr.  Lequita Halt for significant history of bilateral knee pain that has been ongoing  for greater than 5 years now.  The right knee is more symptomatic than the  left.  No injury leading to a problem.  She has undergone cortisone  injections in the past for treatment of her significant arthritis.  They  have only helped a limited amount of time.  The knees are interfering with  what she can and cannot do.  She is seen in the office and x-ray shows  significant end-stage arthritis, the right knee appears to be worse on  radiograph as compared to the left.  It is felt that she has reached the  point where she could benefit from undergoing surgical intervention.  Risks  and benefits are discussed.  The patient is subsequently admitted to the  hospital.   ALLERGIES:  SULFA.   CURRENT MEDICATIONS:  1.  Norvasc 10 mg daily.  2.  Avalide 300/12.5 mg daily.  3.  Detrol LA 4 mg daily.  4.  Doxycycline 100 mg twice a day.   PAST MEDICAL HISTORY:  Hypertension.  History of hemorrhoids.  History of  renal calculi.  Non-insulin dependent diabetes mellitus.  Arthritis.  Postmenopausal.   PAST SURGICAL HISTORY:  Appendectomy and also tubal ligation.   SOCIAL HISTORY:  Married, nonsmoker, and no alcohol.  She has four children,  three of which she has given birth to and one is adopted.   FAMILY HISTORY:  Sister with history of breast cancer, brother with history  of skin  cancer.  Father and grandfather with history of heart disease.  She  also has a brother with hypertension and also diabetes.   REVIEW OF SYSTEMS:  GENERAL:  No fever, chills, or night sweats. NEUROLOGY:  No seizures, syncope, or paralysis. RESPIRATORY:  No shortness of breath,  productive cough, or hemoptysis.  CARDIOVASCULAR:  She has not had any  significant chest pain although she did have illness back in December.  GASTROINTESTINAL:  She was sick approximately several weeks ago back in  December following a root canal.  She is very weak with some generalized  ache.  No diarrhea or constipation.  GENITOURINARY:  No dysuria, hematuria,  or discharge.  MUSCULOSKELETAL:  Bilateral knees.   PHYSICAL EXAMINATION:  VITAL SIGNS:  Pulse 88, respirations 14, blood  pressure 154/86.  GENERAL:  A 70 year old white female, well-nourished, well-developed, in no  acute distress.  She is alert, oriented, and cooperative.  Mildly somewhat  anxious at the time of examination.  She is accompanied by her husband.  She  is somewhat anxious due to the fact that she is currently undergoing cardiac  workup and she does not know the results at this time.  HEENT:  Normocephalic and atraumatic.  Pupils equal, round, and reactive to  light.  Oropharynx clear.  EOM's intact.  NECK:  Supple.  No carotid bruits are appreciated.  CHEST:  Clear anterior and posterior chest walls.  No rhonchi, rales, or  wheezing.  HEART:  Regular rhythm.  No murmurs appreciated.  S1 and S2 noted.  ABDOMEN:  Soft, round abdomen, nontender.  Bowel sounds present.  RECTAL:  BREASTS:  GENITOURINARY:  Not done, not pertinent to present  illness.  EXTREMITIES:  Right knee shows range of motion of 5 to 110 degrees.  There  is no instability, no effusion, crepitus is noted.   IMPRESSION:  1.  Osteoarthritis bilateral knees, right more symptomatic and problematic      than the left.  2.  Hypertension.  3.  Hemorrhoids.  4.  History of  renal calculi.  5.  Diet controlled non-insulin dependent diabetes mellitus.  6.  Arthritis.  7.  Postmenopausal.   PLAN:  The patient will be admitted to [facility] to  undergo right total knee arthroplasty on June 19, 2005.  At the time of  this dictation, she has undergone a stress test on June 17, 2005, and she  is going over to have an echo done at [facility].  The results of  both of these tests are pending at the time of dictation.  We will follow up  if she is cleared from a cardiac standpoint.  We will proceed with right  total knee.      Alexzandrew L. Julien Girt, P.A.      Ollen Gross, M.D.  Electronically Signed    ALP/MEDQ  D:  06/18/2005  T:  06/18/2005  Job:  [id number]   cc:   Della Goo, M.D.  Fax: [phone]   [facility]

## 2010-10-03 NOTE — Assessment & Plan Note (Signed)
Stable

## 2010-10-30 ENCOUNTER — Ambulatory Visit
Admission: RE | Admit: 2010-10-30 | Discharge: 2010-10-30 | Disposition: A | Discharge status: Home / Self Care | Payer: Medicare HMO | Source: Ambulatory Visit | Attending: Radiation Oncology | Admitting: Radiation Oncology

## 2010-10-31 ENCOUNTER — Other Ambulatory Visit: Payer: Self-pay | Admitting: Radiation Oncology

## 2010-10-31 DIAGNOSIS — Z853 Personal history of malignant neoplasm of breast: Secondary | ICD-10-CM

## 2010-12-03 ENCOUNTER — Other Ambulatory Visit: Payer: Self-pay | Admitting: *Deleted

## 2010-12-03 DIAGNOSIS — N3281 Overactive bladder: Secondary | ICD-10-CM

## 2010-12-03 MED ORDER — TOLTERODINE TARTRATE ER 4 MG PO CP24: 4.0000 mg | ORAL_CAPSULE | Freq: Every day | ORAL | Status: DC

## 2010-12-05 ENCOUNTER — Other Ambulatory Visit: Payer: Self-pay | Admitting: *Deleted

## 2010-12-05 DIAGNOSIS — N3281 Overactive bladder: Secondary | ICD-10-CM

## 2010-12-05 MED ORDER — TOLTERODINE TARTRATE ER 4 MG PO CP24: 4.0000 mg | ORAL_CAPSULE | Freq: Every day | ORAL | Status: DC

## 2011-01-07 ENCOUNTER — Other Ambulatory Visit: Payer: Self-pay | Admitting: Internal Medicine

## 2011-02-20 ENCOUNTER — Encounter: Payer: Self-pay | Admitting: Internal Medicine

## 2011-02-20 ENCOUNTER — Ambulatory Visit (INDEPENDENT_AMBULATORY_CARE_PROVIDER_SITE_OTHER): Payer: Medicare HMO | Admitting: Internal Medicine

## 2011-02-20 VITALS — BP 140/70 | HR 76 | Temp 98.2°F | Resp 12 | Ht 62.5 in | Wt 198.0 lb

## 2011-02-20 DIAGNOSIS — I10 Essential (primary) hypertension: Secondary | ICD-10-CM

## 2011-02-20 DIAGNOSIS — Z23 Encounter for immunization: Secondary | ICD-10-CM

## 2011-02-20 DIAGNOSIS — M549 Dorsalgia, unspecified: Secondary | ICD-10-CM

## 2011-02-20 DIAGNOSIS — G4733 Obstructive sleep apnea (adult) (pediatric): Secondary | ICD-10-CM

## 2011-02-20 DIAGNOSIS — Z0000 Encounter for general adult medical examination without abnormal findings: Secondary | ICD-10-CM

## 2011-02-20 DIAGNOSIS — R350 Frequency of micturition: Secondary | ICD-10-CM

## 2011-02-20 DIAGNOSIS — E119 Type 2 diabetes mellitus without complications: Secondary | ICD-10-CM

## 2011-02-20 LAB — BASIC METABOLIC PANEL
CO2: 31 mEq/L (ref 19–32)
Calcium: 9 mg/dL (ref 8.4–10.5)
Chloride: 97 mEq/L (ref 96–112)
Creatinine, Ser: 0.6 mg/dL (ref 0.4–1.2)
Glucose, Bld: 147 mg/dL — ABNORMAL HIGH (ref 70–99)
Sodium: 137 mEq/L (ref 135–145)

## 2011-02-20 LAB — POCT URINALYSIS DIPSTICK
Glucose, UA: NEGATIVE
Ketones, UA: NEGATIVE
Spec Grav, UA: 1.025
Urobilinogen, UA: 0.2

## 2011-02-20 MED ORDER — SOLIFENACIN SUCCINATE 10 MG PO TABS: 10.0000 mg | ORAL_TABLET | Freq: Every day | ORAL | Status: DC

## 2011-02-20 NOTE — Patient Instructions (Signed)
So since the Detrol failed , do a trial of VESIcare 10 mg one by mouth daily take at about supper time each day

## 2011-02-27 ENCOUNTER — Ambulatory Visit (INDEPENDENT_AMBULATORY_CARE_PROVIDER_SITE_OTHER): Payer: Medicare HMO | Admitting: General Surgery

## 2011-02-27 ENCOUNTER — Encounter (INDEPENDENT_AMBULATORY_CARE_PROVIDER_SITE_OTHER): Payer: Self-pay | Admitting: General Surgery

## 2011-02-27 VITALS — BP 138/78 | HR 72 | Temp 97.0°F | Resp 20 | Ht 62.5 in | Wt 197.2 lb

## 2011-02-27 DIAGNOSIS — Z853 Personal history of malignant neoplasm of breast: Secondary | ICD-10-CM

## 2011-02-27 NOTE — Patient Instructions (Signed)
We will see you in 3 months but call sooner if you feel like the fluid needs to be drawn off for that .

## 2011-02-27 NOTE — Progress Notes (Signed)
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 She has had a persistent seroma at the lumpectomy site which I have aspirated several times in the past. She gets temporary relief but it fairly quickly returns. She has had some discomfort in the area which bothers her.  On examination there is a typical feeling seroma at the lumpectomy site in the lateral right breast. There are no other masses and no adenopathy.  I reaspirated area today and obtained 30 cc of straw-colored fluid with complete resolution.  Assessment and plan: Status post lumpectomy and partial breast radiation right breast with persistent seroma. The patient and her husband had a lot of questions about this today which I tried to answer. We discussed options of continued aspiration versus reexcision of the lumpectomy cavity. This is certainly not bothering her enough at this time that she wants surgery. I'll plan to see her back in 3 months or sooner if this recurs to her she wants aspirated.

## 2011-03-06 ENCOUNTER — Ambulatory Visit
Admission: RE | Admit: 2011-03-06 | Discharge: 2011-03-06 | Disposition: A | Discharge status: Home / Self Care | Payer: Medicare HMO | Source: Ambulatory Visit | Attending: Radiation Oncology | Admitting: Radiation Oncology

## 2011-03-06 DIAGNOSIS — Z853 Personal history of malignant neoplasm of breast: Secondary | ICD-10-CM

## 2011-03-24 ENCOUNTER — Telehealth: Payer: Self-pay | Admitting: Internal Medicine

## 2011-03-24 MED ORDER — SOLIFENACIN SUCCINATE 10 MG PO TABS: 10.0000 mg | ORAL_TABLET | Freq: Every day | ORAL | Status: DC

## 2011-03-24 NOTE — Telephone Encounter (Signed)
Was on samples of Vesicare. Pt would like a new rx sent to [facility]. Thanks.

## 2011-04-06 ENCOUNTER — Other Ambulatory Visit: Payer: Medicare HMO | Admitting: Lab

## 2011-04-06 ENCOUNTER — Ambulatory Visit: Payer: Medicare HMO | Admitting: Oncology

## 2011-04-08 ENCOUNTER — Telehealth: Payer: Self-pay | Admitting: *Deleted

## 2011-04-13 ENCOUNTER — Ambulatory Visit: Payer: Medicare HMO | Admitting: Oncology

## 2011-04-13 ENCOUNTER — Other Ambulatory Visit: Payer: Medicare HMO

## 2011-04-14 ENCOUNTER — Other Ambulatory Visit: Payer: Self-pay | Admitting: *Deleted

## 2011-04-14 ENCOUNTER — Telehealth: Payer: Self-pay | Admitting: Internal Medicine

## 2011-04-14 DIAGNOSIS — I10 Essential (primary) hypertension: Secondary | ICD-10-CM

## 2011-04-14 MED ORDER — AMLODIPINE BESYLATE 10 MG PO TABS: 10.0000 mg | ORAL_TABLET | Freq: Every day | ORAL | Status: DC

## 2011-04-14 MED ORDER — METFORMIN HCL 500 MG PO TABS: 500.0000 mg | ORAL_TABLET | Freq: Every evening | ORAL | Status: DC

## 2011-04-14 MED ORDER — LISINOPRIL-HYDROCHLOROTHIAZIDE 20-25 MG PO TABS: 1.0000 | ORAL_TABLET | Freq: Every day | ORAL | Status: DC

## 2011-04-14 MED ORDER — METFORMIN HCL 500 MG PO TABS: ORAL_TABLET | ORAL | Status: DC

## 2011-04-14 NOTE — Telephone Encounter (Signed)
Done

## 2011-04-14 NOTE — Telephone Encounter (Signed)
Pt requesting refills be called into [facility]  metFORMIN (GLUCOPHAGE) 500 MG tablet  amLODipine (NORVASC) 10 MG tablet  lisinopril-hydrochlorothiazide (PRINZIDE,ZESTORETIC) 20-25 MG per tablet

## 2011-05-07 ENCOUNTER — Ambulatory Visit: Payer: Medicare HMO | Admitting: Family Medicine

## 2011-05-07 ENCOUNTER — Telehealth: Payer: Self-pay | Admitting: Internal Medicine

## 2011-05-07 ENCOUNTER — Other Ambulatory Visit: Payer: Self-pay | Admitting: *Deleted

## 2011-05-07 MED ORDER — PRAMIPEXOLE DIHYDROCHLORIDE 1 MG PO TABS: 1.0000 mg | ORAL_TABLET | Freq: Three times a day (TID) | ORAL | Status: DC

## 2011-05-07 NOTE — Telephone Encounter (Signed)
Pt had called earlier today and said that she had increased the Mirapex as recommended per Dr Cato Mulligan, but said that it didn't help. Pt then sch an ov to see Adline Mango today, but pts spouse called and cx the ov for today. Pts spouse said that the increased dose of med, finally had taken effect and that the pt was sleeping and would not need to come in today.

## 2011-05-07 NOTE — Telephone Encounter (Signed)
Pt called and said that her muscles in legs have been jumping all night and its very annoying and painful. Pt req a muscle relaxer to be called in to [facility] in [place], or is she gets a work in [employer] for today with Dr Lovell Sheehan, call in to [facility] on [address].

## 2011-05-07 NOTE — Telephone Encounter (Signed)
Talked with dr swords and he said to increase mirapex to1 mg qhs -pt informed

## 2011-05-07 NOTE — Telephone Encounter (Signed)
ok 

## 2011-05-18 NOTE — Progress Notes (Signed)
Subjective:    Patient ID: [name], female    DOB: [birth year], 70 y.o.   MRN: [id number]  HPI The patient is a 70 year old white female breast cancer survivor with adult-onset diabetes a history of obstructive sleep apnea a history of restless leg syndrome and history of hypertension.  She presents today with a chief complaint of worsening restless legs. She has a secondary complaint of increased urinary frequency. She does have a diuretic and her blood pressure medications She is currently on Norvasc lisinopril and hydrochlorothiazide. She states that her CBGs are unknown because she does not monitor her blood glucose   Review of Systems  Constitutional: Negative for activity change, appetite change and fatigue.  HENT: Negative for ear pain, congestion, neck pain, postnasal drip and sinus pressure.   Eyes: Negative for redness and visual disturbance.  Respiratory: Negative for cough, shortness of breath and wheezing.   Gastrointestinal: Negative for abdominal pain and abdominal distention.  Genitourinary: Negative for dysuria, frequency and menstrual problem.  Musculoskeletal: Negative for myalgias, joint swelling and arthralgias.  Skin: Negative for rash and wound.  Neurological: Negative for dizziness, weakness and headaches.  Hematological: Negative for adenopathy. Does not bruise/bleed easily.  Psychiatric/Behavioral: Negative for sleep disturbance and decreased concentration.   Past Medical History  Diagnosis Date  . Diabetes mellitus   . Hypertension   . Arthritis   . Cancer     breast   . Hearing loss     History   Social History  . Marital Status: Married    Spouse Name: N/A    Number of Children: N/A  . Years of Education: N/A   Occupational History  . Not on file.   Social History Main Topics  . Smoking status: Never Smoker   . Smokeless tobacco: Not on file  . Alcohol Use: No  . Drug Use: No  . Sexually Active: Yes   Other Topics Concern  .  Not on file   Social History Narrative  . No narrative on file    Past Surgical History  Procedure Date  . Flex signoidoscopy   . Incision and drainage perirectal abscess   . Appendectomy   . Tubal ligation 1979  . Knee surgery 2006    right knee   . Breast surgery     No family history on file.  Allergies  Allergen Reactions  . Sulfonamide Derivatives     Current Outpatient Prescriptions on File Prior to Visit  Medication Sig Dispense Refill  . glucose blood (FREESTYLE LITE) test strip Use as instructed  100 each  3  . HYDROcodone-acetaminophen (LORTAB) 7.5-500 MG per tablet TAKE ONE TABLET BY MOUTH EVERY 6 HOURS AS NEEDED FOR PAIN  30 tablet  2  . LORazepam (ATIVAN) 1 MG tablet Take 1 tablet (1 mg total) by mouth every 8 (eight) hours as needed for anxiety.  30 tablet  3    BP 140/70  Pulse 76  Temp 98.2 F (36.8 C)  Resp 12  Ht 5' 2.5" (1.588 m)  Wt 198 lb (89.812 kg)  BMI 35.64 kg/m2       Objective:   Physical Exam  Nursing note and vitals reviewed. Constitutional: She is oriented to person, place, and time. She appears well-developed and well-nourished. No distress.  HENT:  Head: Normocephalic and atraumatic.  Right Ear: External ear normal.  Left Ear: External ear normal.  Nose: Nose normal.  Mouth/Throat: Oropharynx is clear and moist.  Eyes: Conjunctivae and EOM are  normal. Pupils are equal, round, and reactive to light.  Neck: Normal range of motion. Neck supple. No JVD present. No tracheal deviation present. No thyromegaly present.  Cardiovascular: Normal rate, regular rhythm, normal heart sounds and intact distal pulses.   No murmur heard. Pulmonary/Chest: Effort normal and breath sounds normal. She has no wheezes. She exhibits no tenderness.  Abdominal: Soft. Bowel sounds are normal.  Musculoskeletal: Normal range of motion. She exhibits no edema and no tenderness.  Lymphadenopathy:    She has no cervical adenopathy.  Neurological: She is  alert and oriented to person, place, and time. She has normal reflexes. No cranial nerve deficit.  Skin: Skin is warm and dry. She is not diaphoretic.  Psychiatric: She has a normal mood and affect. Her behavior is normal.          Assessment & Plan:  Increase of Mirapex to 1 mg with consideration for titration with 2 mg patch if the symptoms persist.  Blood pressure is controlled on 3 medications a monitored of a basic metabolic panel today.  Her urinary frequency is partially controlled by the VESIcare she does not wish to add a second agent and will consider seeing urology to discuss other interventions.  Her diabetic control is unknown at this time a glucometer was given to her and she was encouraged to monitor her blood glucoses hemoglobin A1c would be obtained today

## 2011-05-21 ENCOUNTER — Telehealth: Payer: Self-pay | Admitting: Oncology

## 2011-05-21 NOTE — Telephone Encounter (Signed)
pt called and scheduled appt for 01/10

## 2011-05-25 ENCOUNTER — Ambulatory Visit (INDEPENDENT_AMBULATORY_CARE_PROVIDER_SITE_OTHER): Payer: Self-pay | Admitting: Internal Medicine

## 2011-05-25 ENCOUNTER — Encounter: Payer: Self-pay | Admitting: Internal Medicine

## 2011-05-25 DIAGNOSIS — N3281 Overactive bladder: Secondary | ICD-10-CM

## 2011-05-25 DIAGNOSIS — G2581 Restless legs syndrome: Secondary | ICD-10-CM

## 2011-05-25 DIAGNOSIS — E1165 Type 2 diabetes mellitus with hyperglycemia: Secondary | ICD-10-CM

## 2011-05-25 DIAGNOSIS — F411 Generalized anxiety disorder: Secondary | ICD-10-CM

## 2011-05-25 DIAGNOSIS — I10 Essential (primary) hypertension: Secondary | ICD-10-CM

## 2011-05-25 DIAGNOSIS — N318 Other neuromuscular dysfunction of bladder: Secondary | ICD-10-CM

## 2011-05-25 DIAGNOSIS — IMO0002 Reserved for concepts with insufficient information to code with codable children: Secondary | ICD-10-CM

## 2011-05-25 DIAGNOSIS — F419 Anxiety disorder, unspecified: Secondary | ICD-10-CM

## 2011-05-25 LAB — BASIC METABOLIC PANEL
BUN: 11 mg/dL (ref 6–23)
CO2: 31 mEq/L (ref 19–32)
Calcium: 9.3 mg/dL (ref 8.4–10.5)
Chloride: 98 mEq/L (ref 96–112)
Creatinine, Ser: 0.7 mg/dL (ref 0.4–1.2)
Glucose, Bld: 152 mg/dL — ABNORMAL HIGH (ref 70–99)

## 2011-05-25 MED ORDER — PRAMIPEXOLE DIHYDROCHLORIDE 1 MG PO TABS: 0.5000 mg | ORAL_TABLET | Freq: Every day | ORAL | Status: DC

## 2011-05-25 MED ORDER — LISINOPRIL-HYDROCHLOROTHIAZIDE 20-25 MG PO TABS: 1.0000 | ORAL_TABLET | Freq: Every day | ORAL | Status: DC

## 2011-05-25 MED ORDER — AMLODIPINE BESYLATE 10 MG PO TABS: 10.0000 mg | ORAL_TABLET | Freq: Every day | ORAL | Status: DC

## 2011-05-25 MED ORDER — METFORMIN HCL 500 MG PO TABS: ORAL_TABLET | ORAL | Status: DC

## 2011-05-25 MED ORDER — SOLIFENACIN SUCCINATE 10 MG PO TABS: 10.0000 mg | ORAL_TABLET | Freq: Every day | ORAL | Status: DC

## 2011-05-25 MED ORDER — LORAZEPAM 1 MG PO TABS: 1.0000 mg | ORAL_TABLET | Freq: Three times a day (TID) | ORAL | Status: DC | PRN

## 2011-05-25 NOTE — Progress Notes (Signed)
Addended by: Stacie Glaze MD E on: 05/25/2011 08:44 AM   Modules accepted: Orders

## 2011-05-25 NOTE — Patient Instructions (Signed)
The patient is instructed to continue all medications as prescribed. Schedule followup with check out clerk upon leaving the clinic  

## 2011-05-25 NOTE — Progress Notes (Signed)
Subjective:    Patient ID: [name], female    DOB: 21-Feb-1941, 71 y.o.   MRN: [id number]  HPI  Has a follow up with oncology for 1 year follow up of breast cancer. Has changed insurance and she needs all new rx's for her medications She has hearing loss and has lost her hearing aids Blood sugars are running in the 120-130 range fasting Restless legs have been stable on 1/2  ( .50) She had one bad week that required 1 mg. Follow up of blood pressure Review of Systems  Constitutional: Negative for activity change, appetite change and fatigue.  HENT: Negative for ear pain, congestion, neck pain, postnasal drip and sinus pressure.   Eyes: Negative for redness and visual disturbance.  Respiratory: Negative for cough, shortness of breath and wheezing.   Gastrointestinal: Negative for abdominal pain and abdominal distention.  Genitourinary: Negative for dysuria, frequency and menstrual problem.  Musculoskeletal: Negative for myalgias, joint swelling and arthralgias.  Skin: Negative for rash and wound.  Neurological: Negative for dizziness, weakness and headaches.  Hematological: Negative for adenopathy. Does not bruise/bleed easily.  Psychiatric/Behavioral: Negative for sleep disturbance and decreased concentration.       Past Medical History  Diagnosis Date  . Diabetes mellitus   . Hypertension   . Arthritis   . Cancer     breast   . Hearing loss     History   Social History  . Marital Status: Married    Spouse Name: N/A    Number of Children: N/A  . Years of Education: N/A   Occupational History  . Not on file.   Social History Main Topics  . Smoking status: Never Smoker   . Smokeless tobacco: Not on file  . Alcohol Use: No  . Drug Use: No  . Sexually Active: Yes   Other Topics Concern  . Not on file   Social History Narrative  . No narrative on file    Past Surgical History  Procedure Date  . Flex signoidoscopy   . Incision and drainage perirectal  abscess   . Appendectomy   . Tubal ligation 1979  . Knee surgery 2006    right knee   . Breast surgery     No family history on file.  Allergies  Allergen Reactions  . Sulfonamide Derivatives     Current Outpatient Prescriptions on File Prior to Visit  Medication Sig Dispense Refill  . amLODipine (NORVASC) 10 MG tablet Take 1 tablet (10 mg total) by mouth daily.  90 tablet  3  . glucose blood (FREESTYLE LITE) test strip Use as instructed  100 each  3  . HYDROcodone-acetaminophen (LORTAB) 7.5-500 MG per tablet TAKE ONE TABLET BY MOUTH EVERY 6 HOURS AS NEEDED FOR PAIN  30 tablet  2  . lisinopril-hydrochlorothiazide (PRINZIDE,ZESTORETIC) 20-25 MG per tablet Take 1 tablet by mouth daily.  90 tablet  3  . LORazepam (ATIVAN) 1 MG tablet Take 1 tablet (1 mg total) by mouth every 8 (eight) hours as needed for anxiety.  30 tablet  3  . metFORMIN (GLUCOPHAGE) 500 MG tablet 3 at bedtime  270 tablet  3  . pramipexole (MIRAPEX) 1 MG tablet Take 0.5 mg by mouth at bedtime.       . solifenacin (VESICARE) 10 MG tablet Take 1 tablet (10 mg total) by mouth daily.  30 tablet  11    BP 140/80  Pulse 76  Temp 98.3 F (36.8 C)  Resp 16  Ht 5' 0.25" (1.53 m)  Wt 199 lb (90.266 kg)  BMI 38.54 kg/m2    Objective:   Physical Exam  Nursing note and vitals reviewed. Constitutional: She is oriented to person, place, and time. She appears well-developed and well-nourished. No distress.  HENT:  Head: Normocephalic and atraumatic.  Right Ear: External ear normal.  Left Ear: External ear normal.  Nose: Nose normal.  Mouth/Throat: Oropharynx is clear and moist.  Eyes: Conjunctivae and EOM are normal. Pupils are equal, round, and reactive to light.  Neck: Normal range of motion. Neck supple. No JVD present. No tracheal deviation present. No thyromegaly present.  Cardiovascular: Normal rate, regular rhythm, normal heart sounds and intact distal pulses.   No murmur heard. Pulmonary/Chest: Effort  normal and breath sounds normal. She has no wheezes. She exhibits no tenderness.  Abdominal: Soft. Bowel sounds are normal.  Musculoskeletal: Normal range of motion. She exhibits no edema and no tenderness.  Lymphadenopathy:    She has no cervical adenopathy.  Neurological: She is alert and oriented to person, place, and time. She has normal reflexes. No cranial nerve deficit.  Skin: Skin is warm and dry. She is not diaphoretic.  Psychiatric: She has a normal mood and affect. Her behavior is normal.          Assessment & Plan:  Last hemoglobin of A1c was elevated therefore we will recheck the hemoglobin A1c today and adjust the Glucophage as needed.  I'm hopeful that the reported CBGs it she has been measuring are reflective of A1c in the 7 range.  She needs refills of all of her medications due to insurance change and we will provider her copies of her medications today she had a temporary increase in the Mirapex due to a flare versus legs but is stable on a half a milligram at this time we will continue the prescription at 1 mg to allow her the flexibility to adjust the dose as needed for restless legs.  Hemoglobin A1c will be monitored today as well as a basic metabolic panel to assess potassium

## 2011-05-28 ENCOUNTER — Ambulatory Visit (HOSPITAL_BASED_OUTPATIENT_CLINIC_OR_DEPARTMENT_OTHER): Payer: Medicare Other | Admitting: Oncology

## 2011-05-28 VITALS — BP 152/78 | HR 77 | Temp 97.8°F | Ht 60.25 in | Wt 202.9 lb

## 2011-05-28 DIAGNOSIS — C50919 Malignant neoplasm of unspecified site of unspecified female breast: Secondary | ICD-10-CM

## 2011-05-28 MED ORDER — ANASTROZOLE 1 MG PO TABS: 1.0000 mg | ORAL_TABLET | Freq: Every day | ORAL | Status: DC

## 2011-05-28 MED ORDER — ANASTROZOLE 1 MG PO TABS: 1.0000 mg | ORAL_TABLET | Freq: Every day | ORAL | Status: AC

## 2011-05-28 NOTE — Progress Notes (Signed)
OFFICE PROGRESS NOTE  CC Dr. Sharlet Salina Hoxworth Dr. Danna Hefty, MD [address] 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  PRIOR THERAPY:  #1 patient is status post lumpectomy for a stage I infiltrating ductal carcinoma that was ER positive.  #2 patient then underwent accelerated breast radiation with MammoSite completed on 05/05/2010.  #3 she was then started on Arimidex 1 mg daily. This was started on January 2012.  CURRENT THERAPY:Arimidex 1 mg daily her  INTERVAL HISTORY: [name] 71 y.o. female returns for followup visit today. Overall she is doing well she is tolerating the Arimidex very well. She is really without any complaints she denies any fevers chills night sweats headaches shortness of breath chest pains palpitations she has no myalgias or arthralgias she is eating well. She is sleeping well. She has no hot flashes no vaginal discharge or bleeding. She has not noticed any swelling in her legs. Remainder of the 10 point review of systems is negative.  MEDICAL HISTORY: Past Medical History  Diagnosis Date  . Diabetes mellitus   . Hypertension   . Arthritis   . Cancer     breast   . Hearing loss     ALLERGIES:  is allergic to sulfonamide derivatives.  MEDICATIONS:  Current Outpatient Prescriptions  Medication Sig Dispense Refill  . amLODipine (NORVASC) 10 MG tablet Take 1 tablet (10 mg total) by mouth daily.  90 tablet  3  . glucose blood (FREESTYLE LITE) test strip Use as instructed  100 each  3  . HYDROcodone-acetaminophen (LORTAB) 7.5-500 MG per tablet TAKE ONE TABLET BY MOUTH EVERY 6 HOURS AS NEEDED FOR PAIN  30 tablet  2  . lisinopril-hydrochlorothiazide (PRINZIDE,ZESTORETIC) 20-25 MG per tablet Take 1 tablet by mouth daily.  90 tablet  3  . LORazepam (ATIVAN) 1 MG tablet Take 1 tablet (1 mg total) by mouth every 8  (eight) hours as needed for anxiety.  90 tablet  3  . metFORMIN (GLUCOPHAGE) 500 MG tablet 3 at bedtime  270 tablet  3  . pramipexole (MIRAPEX) 1 MG tablet Take 0.5 tablets (0.5 mg total) by mouth at bedtime.  90 tablet  3  . solifenacin (VESICARE) 10 MG tablet Take 1 tablet (10 mg total) by mouth daily.  90 tablet  3  . anastrozole (ARIMIDEX) 1 MG tablet Take 1 tablet (1 mg total) by mouth daily.  30 tablet  1  . anastrozole (ARIMIDEX) 1 MG tablet Take 1 tablet (1 mg total) by mouth daily.  90 tablet  6    SURGICAL HISTORY:  Past Surgical History  Procedure Date  . Flex signoidoscopy   . Incision and drainage perirectal abscess   . Appendectomy   . Tubal ligation 1979  . Knee surgery 2006    right knee   . Breast surgery     REVIEW OF SYSTEMS:  Pertinent items are noted in HPI.   PHYSICAL EXAMINATION: General appearance: alert, cooperative and appears stated age Head: Normocephalic, without obvious abnormality, atraumatic Neck: no adenopathy, no carotid bruit, no JVD, supple, symmetrical, trachea midline and thyroid not enlarged, symmetric, no tenderness/mass/nodules Lymph nodes: Cervical, supraclavicular, and axillary nodes normal. Resp: clear to auscultation bilaterally and normal percussion bilaterally Back: symmetric, no curvature. ROM normal. No CVA tenderness. Cardio: regular rate and rhythm, S1, S2 normal, no murmur, click, rub or gallop and normal apical impulse GI: soft,  non-tender; bowel sounds normal; no masses,  no organomegaly Extremities: extremities normal, atraumatic, no cyanosis or edema Neurologic: Alert and oriented X 3, normal strength and tone. Normal symmetric reflexes. Normal coordination and gait Bilateral breast examination is performed right breast reveals healed surgical scar there is no  Masses or nipple discharge or retraction or inversion. Left breast no masses or nipple discharge. ECOG PERFORMANCE STATUS: 1 - Symptomatic but completely  ambulatory  Blood pressure 152/78, pulse 77, temperature 97.8 F (36.6 C), temperature source Oral, height 5' 0.25" (1.53 m), weight 202 lb 14.4 oz (92.035 kg).  LABORATORY DATA: Lab Results  Component Value Date   WBC 8.4 09/29/2010   HGB 14.1 09/29/2010   HCT 41.4 09/29/2010   MCV 93.4 09/29/2010   PLT 244 09/29/2010      Chemistry      Component Value Date/Time   NA 137 05/25/2011 0854   K 3.8 05/25/2011 0854   CL 98 05/25/2011 0854   CO2 31 05/25/2011 0854   BUN 11 05/25/2011 0854   CREATININE 0.7 05/25/2011 0854   CREATININE 0.90 10/03/2010 1635      Component Value Date/Time   CALCIUM 9.3 05/25/2011 0854   ALKPHOS 79 09/29/2010 1300   ALKPHOS 79 09/29/2010 1300   ALKPHOS 79 09/29/2010 1300   AST 22 09/29/2010 1300   AST 22 09/29/2010 1300   AST 22 09/29/2010 1300   ALT 19 09/29/2010 1300   ALT 19 09/29/2010 1300   ALT 19 09/29/2010 1300   BILITOT 0.5 09/29/2010 1300   BILITOT 0.5 09/29/2010 1300   BILITOT 0.5 09/29/2010 1300       RADIOGRAPHIC STUDIES:  No results found.  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 Patient underwent a lumpectomy 48 ER positive breast cancer. She then had MammoSite tolerated breast radiation. She completed this on 05/05/2010. Thereafter she was begun on Arimidex 1 mg daily. Thus far she has tolerated it quite well. She is without evidence of recurrent disease and she is without any complaints.   PLAN: patient will continue to be seen by me on every 6 month basis. I did give her prescriptions for Arimidex.   All questions were answered. The patient knows to call the clinic with any problems, questions or concerns. We can certainly see the patient much sooner if necessary.  I spent 25 minutes counseling the patient face to face. The total time spent in the appointment was 30 minutes.    Drue Second, MD Medical/Oncology [facility] [phone]  (beeper) [phone] (Office)  05/28/2011, 6:38 PM

## 2011-06-10 ENCOUNTER — Encounter (INDEPENDENT_AMBULATORY_CARE_PROVIDER_SITE_OTHER): Payer: Self-pay | Admitting: General Surgery

## 2011-06-10 ENCOUNTER — Ambulatory Visit (INDEPENDENT_AMBULATORY_CARE_PROVIDER_SITE_OTHER): Payer: Medicare Other | Admitting: General Surgery

## 2011-06-10 DIAGNOSIS — C50919 Malignant neoplasm of unspecified site of unspecified female breast: Secondary | ICD-10-CM

## 2011-06-10 NOTE — Progress Notes (Signed)
Chief complaint: Followup breast cancer  History: Patient returns for long-term followup status post right breast lumpectomy, partial breast radiation and now on Arimadex for T1 B. N0 ER positive right breast cancer. She has had a postop seroma that we have followed. She states gives her occasional mild discomfort. she otherwise feels well with no other breast lumps, skin changes, nipple discharge or other symptoms.  Exam: General: Appears well Skin: Warm and dry without rash or infections Lymph nodes: No cervical, supraclavicular, or axillary nodes palpable Breasts: There is a palpable rounded mass consistent with seroma at the lumpectomy site which seems less prominent than previous exams. No other palpable masses skin changes or other concerns  Imaging: Mammogram in October 2012 showed a 5 cm typical appearing seroma but no other abnormalities  Assessment plan: Generally doing well with no evidence of early recurrence or complication other than her seroma. It felt smaller today and as was bothering her a little bit and to assess the size we elected to aspirate this one further time. I did again get 30 cc of clear fluid under sterile technique. His bothering her less as time goes on and from now on we will just monitor this with her routine imaging. She will return in September.

## 2011-08-24 ENCOUNTER — Encounter: Payer: Self-pay | Admitting: Internal Medicine

## 2011-08-24 ENCOUNTER — Ambulatory Visit (INDEPENDENT_AMBULATORY_CARE_PROVIDER_SITE_OTHER): Payer: Medicare Other | Admitting: Internal Medicine

## 2011-08-24 VITALS — BP 134/84 | HR 72 | Temp 98.2°F | Resp 16 | Ht 62.5 in | Wt 198.0 lb

## 2011-08-24 DIAGNOSIS — IMO0002 Reserved for concepts with insufficient information to code with codable children: Secondary | ICD-10-CM

## 2011-08-24 DIAGNOSIS — E1165 Type 2 diabetes mellitus with hyperglycemia: Secondary | ICD-10-CM

## 2011-08-24 DIAGNOSIS — I10 Essential (primary) hypertension: Secondary | ICD-10-CM

## 2011-08-24 DIAGNOSIS — E119 Type 2 diabetes mellitus without complications: Secondary | ICD-10-CM

## 2011-08-24 DIAGNOSIS — Z0000 Encounter for general adult medical examination without abnormal findings: Secondary | ICD-10-CM

## 2011-08-24 DIAGNOSIS — M1990 Unspecified osteoarthritis, unspecified site: Secondary | ICD-10-CM

## 2011-08-24 DIAGNOSIS — E1169 Type 2 diabetes mellitus with other specified complication: Secondary | ICD-10-CM

## 2011-08-24 MED ORDER — HYDROCODONE-ACETAMINOPHEN 7.5-500 MG PO TABS: 1.0000 | ORAL_TABLET | Freq: Three times a day (TID) | ORAL | Status: DC | PRN

## 2011-08-24 MED ORDER — GLUCOSE BLOOD VI STRP: ORAL_STRIP | Status: DC

## 2011-08-24 NOTE — Patient Instructions (Signed)
The patient is instructed to continue all medications as prescribed. Schedule followup with check out clerk upon leaving the clinic  

## 2011-08-24 NOTE — Progress Notes (Signed)
Subjective:    Patient ID: [name], female    DOB: 1940-11-12, 71 y.o.   MRN: [id number]  HPI Since last hemoglobin A1c was 7.5 her fasting blood sugars in the morning on fingersticks are usually in the 1:30 range.  This correlates with 7.5 hemoglobin A1c we would shoot for a goal although blood sugar around 120+ -10 Her exercise is limited by knee pain an she needs a TKR on the left, she had a TKR on the right Blood pressure is stable Review of Systems  Constitutional: Negative for activity change, appetite change and fatigue.  HENT: Negative for ear pain, congestion, neck pain, postnasal drip and sinus pressure.   Eyes: Negative for redness and visual disturbance.  Respiratory: Negative for cough, shortness of breath and wheezing.   Gastrointestinal: Negative for abdominal pain and abdominal distention.  Genitourinary: Negative for dysuria, frequency and menstrual problem.  Musculoskeletal: Positive for myalgias, joint swelling and arthralgias.  Skin: Negative for rash and wound.  Neurological: Negative for dizziness, weakness and headaches.  Hematological: Negative for adenopathy. Does not bruise/bleed easily.  Psychiatric/Behavioral: Negative for sleep disturbance and decreased concentration.       Past Medical History  Diagnosis Date  . Diabetes mellitus   . Hypertension   . Arthritis   . Hearing loss   . Cancer     right breast     History   Social History  . Marital Status: Married    Spouse Name: N/A    Number of Children: N/A  . Years of Education: N/A   Occupational History  . Not on file.   Social History Main Topics  . Smoking status: Never Smoker   . Smokeless tobacco: Not on file  . Alcohol Use: No  . Drug Use: No  . Sexually Active: Yes   Other Topics Concern  . Not on file   Social History Narrative  . No narrative on file    Past Surgical History  Procedure Date  . Flex signoidoscopy   . Incision and drainage perirectal abscess     . Appendectomy   . Tubal ligation 1979  . Knee surgery 2006    right knee   . Breast surgery     rt breast lumpectomy    Family History  Problem Relation Age of Onset  . Cancer Sister     breast  . Heart disease Paternal Grandfather     Allergies  Allergen Reactions  . Sulfonamide Derivatives     Current Outpatient Prescriptions on File Prior to Visit  Medication Sig Dispense Refill  . amLODipine (NORVASC) 10 MG tablet Take 1 tablet (10 mg total) by mouth daily.  90 tablet  3  . glucose blood (FREESTYLE LITE) test strip Use as instructed  100 each  3  . HYDROcodone-acetaminophen (LORTAB) 7.5-500 MG per tablet TAKE ONE TABLET BY MOUTH EVERY 6 HOURS AS NEEDED FOR PAIN  30 tablet  2  . lisinopril-hydrochlorothiazide (PRINZIDE,ZESTORETIC) 20-25 MG per tablet Take 1 tablet by mouth daily.  90 tablet  3  . LORazepam (ATIVAN) 1 MG tablet Take 1 tablet (1 mg total) by mouth every 8 (eight) hours as needed for anxiety.  90 tablet  3  . metFORMIN (GLUCOPHAGE) 500 MG tablet 3 at bedtime  270 tablet  3  . pramipexole (MIRAPEX) 1 MG tablet Take 0.5 tablets (0.5 mg total) by mouth at bedtime.  90 tablet  3  . solifenacin (VESICARE) 10 MG tablet Take 1 tablet (10  mg total) by mouth daily.  90 tablet  3  . DISCONTD: pramipexole (MIRAPEX) 1 MG tablet Take 1 tablet (1 mg total) by mouth 3 (three) times daily.  30 tablet  6    BP 134/84  Pulse 72  Temp 98.2 F (36.8 C)  Resp 16  Ht 5' 2.5" (1.588 m)  Wt 198 lb (89.812 kg)  BMI 35.64 kg/m2    Objective:   Physical Exam  Nursing note and vitals reviewed. Constitutional: She is oriented to person, place, and time. She appears well-developed and well-nourished. No distress.  HENT:  Head: Normocephalic and atraumatic.  Right Ear: External ear normal.  Left Ear: External ear normal.  Nose: Nose normal.  Mouth/Throat: Oropharynx is clear and moist.  Eyes: Conjunctivae and EOM are normal. Pupils are equal, round, and reactive to light.   Neck: Normal range of motion. Neck supple. No JVD present. No tracheal deviation present. No thyromegaly present.  Cardiovascular: Normal rate, regular rhythm, normal heart sounds and intact distal pulses.   No murmur heard. Pulmonary/Chest: Effort normal and breath sounds normal. She has no wheezes. She exhibits no tenderness.  Abdominal: Soft. Bowel sounds are normal.  Musculoskeletal: Normal range of motion. She exhibits edema and tenderness.       Trigger finger on left hand  Lymphadenopathy:    She has no cervical adenopathy.  Neurological: She is alert and oriented to person, place, and time. She has normal reflexes. No cranial nerve deficit.  Skin: Skin is warm and dry. She is not diaphoretic.  Psychiatric: She has a normal mood and affect. Her behavior is normal.          Assessment & Plan:  Return June 1 for her yearly physical examination monitor her hemoglobin A1c at that time as well as consider an injection in the tendon of her hand as well as her from for osteoarthritis and trigger finger Blood pressure stable on current medications A1c was improved at last monitoring she'll be due another A1c in June

## 2011-10-08 ENCOUNTER — Ambulatory Visit
Admission: RE | Admit: 2011-10-08 | Discharge: 2011-10-08 | Disposition: A | Discharge status: Home / Self Care | Payer: Medicare Other | Source: Ambulatory Visit | Attending: Radiation Oncology | Admitting: Radiation Oncology

## 2011-10-08 DIAGNOSIS — C50919 Malignant neoplasm of unspecified site of unspecified female breast: Secondary | ICD-10-CM

## 2011-10-08 NOTE — Progress Notes (Signed)
  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  She is overall feeling well and doing well. She still has her knee pain. Her regimen next she is tolerating well. She skin irregularities for dinner for the summer. She has a followup with Dr. Welton Flakes PA on May 31. She still continues to have the occasional "shooting" of pain. This has seemed to less than over the past year. She is considering injections for her to try this in her fingers prior to her visit.                              ALLERGIES:  is allergic to sulfonamide derivatives.  Meds: Current Outpatient Prescriptions  Medication Sig Dispense Refill  . amLODipine (NORVASC) 10 MG tablet Take 1 tablet (10 mg total) by mouth daily.  90 tablet  3  . glucose blood (FREESTYLE LITE) test strip Use as instructed  100 each  3  . HYDROcodone-acetaminophen (LORTAB) 7.5-500 MG per tablet Take 1 tablet by mouth every 8 (eight) hours as needed for pain.  30 tablet  2  . lisinopril-hydrochlorothiazide (PRINZIDE,ZESTORETIC) 20-25 MG per tablet Take 1 tablet by mouth daily.  90 tablet  3  . LORazepam (ATIVAN) 1 MG tablet Take 1 tablet (1 mg total) by mouth every 8 (eight) hours as needed for anxiety.  90 tablet  3  . metFORMIN (GLUCOPHAGE) 500 MG tablet 3 at bedtime  270 tablet  3  . pramipexole (MIRAPEX) 1 MG tablet Take 0.5 tablets (0.5 mg total) by mouth at bedtime.  90 tablet  3  . solifenacin (VESICARE) 10 MG tablet Take 1 tablet (10 mg total) by mouth daily.  90 tablet  3  . DISCONTD: pramipexole (MIRAPEX) 1 MG tablet Take 1 tablet (1 mg total) by mouth 3 (three) times daily.  30 tablet  6     Physical Findings: The patient is in no acute distress. Patient is alert and oriented. . she has a hard and palpable seroma cavity beneath the skin of her right breast. This appears to be less noticeable than it has in the past. Other palpable abnormalities the left breast no palpable axillary adenopathy. No palpable cervical or supra-that her adenopathy. She is alert X3.  vitals were not taken for this visit..  No significant changes.  Lab Findings: Lab Results  Component Value Date   WBC 8.4 09/29/2010   HGB 14.1 09/29/2010   HCT 41.4 09/29/2010   MCV 93.4 09/29/2010   PLT 244 09/29/2010      Radiographic Findings: No results found.  Impression:   She has this palpable seroma cavity. He does not want any intervention on this at this time. I will continue followup with her in a year. We talked about discontinuing her followup but she would like to be followed by me. She can obviously canceled his followup if she has scheduled followup with Dr. Welton Flakes.  _____________________________________

## 2011-10-09 ENCOUNTER — Other Ambulatory Visit: Payer: Medicare Other

## 2011-10-09 NOTE — Progress Notes (Signed)
Encounter addended by: Agnes Lawrence, RN on: 10/09/2011  5:36 PM<BR>     Documentation filed: Visit Diagnoses, Notes Section

## 2011-10-09 NOTE — Progress Notes (Signed)
Late entry from 5/23/ at 1200. Seen by Dr. [name] before nursing therefore, a nursing assessment was not done. Dr. [name] aware.

## 2011-10-16 ENCOUNTER — Other Ambulatory Visit (INDEPENDENT_AMBULATORY_CARE_PROVIDER_SITE_OTHER): Payer: Medicare Other

## 2011-10-16 ENCOUNTER — Telehealth: Payer: Self-pay | Admitting: Oncology

## 2011-10-16 ENCOUNTER — Ambulatory Visit (HOSPITAL_BASED_OUTPATIENT_CLINIC_OR_DEPARTMENT_OTHER): Payer: Medicare Other | Admitting: Family

## 2011-10-16 ENCOUNTER — Other Ambulatory Visit (HOSPITAL_BASED_OUTPATIENT_CLINIC_OR_DEPARTMENT_OTHER): Payer: Medicare Other | Admitting: Lab

## 2011-10-16 ENCOUNTER — Encounter: Payer: Self-pay | Admitting: Family

## 2011-10-16 ENCOUNTER — Encounter: Payer: Medicare Other | Admitting: Family

## 2011-10-16 VITALS — BP 153/89 | HR 76 | Temp 98.3°F | Ht 62.5 in | Wt 197.9 lb

## 2011-10-16 DIAGNOSIS — C50919 Malignant neoplasm of unspecified site of unspecified female breast: Secondary | ICD-10-CM

## 2011-10-16 DIAGNOSIS — E119 Type 2 diabetes mellitus without complications: Secondary | ICD-10-CM

## 2011-10-16 DIAGNOSIS — Z170 Estrogen receptor positive status [ER+]: Secondary | ICD-10-CM

## 2011-10-16 DIAGNOSIS — Z79899 Other long term (current) drug therapy: Secondary | ICD-10-CM

## 2011-10-16 DIAGNOSIS — C50419 Malignant neoplasm of upper-outer quadrant of unspecified female breast: Secondary | ICD-10-CM

## 2011-10-16 DIAGNOSIS — Z0000 Encounter for general adult medical examination without abnormal findings: Secondary | ICD-10-CM

## 2011-10-16 LAB — HEMOGLOBIN A1C: Hgb A1c MFr Bld: 7.6 % — ABNORMAL HIGH (ref 4.6–6.5)

## 2011-10-16 LAB — POCT URINALYSIS DIPSTICK
Glucose, UA: NEGATIVE
Ketones, UA: NEGATIVE
Leukocytes, UA: NEGATIVE
Spec Grav, UA: 1.015
Urobilinogen, UA: 0.2

## 2011-10-16 LAB — HEPATIC FUNCTION PANEL
AST: 20 U/L (ref 0–37)
Albumin: 3.8 g/dL (ref 3.5–5.2)
Alkaline Phosphatase: 79 U/L (ref 39–117)
Total Protein: 6.9 g/dL (ref 6.0–8.3)

## 2011-10-16 LAB — CBC WITH DIFFERENTIAL/PLATELET
BASO%: 0.3 % (ref 0.0–2.0)
Basophils Absolute: 0 10*3/uL (ref 0.0–0.1)
EOS%: 3.6 % (ref 0.0–7.0)
Eosinophils Relative: 3 % (ref 0.0–5.0)
HCT: 40.7 % (ref 34.8–46.6)
HCT: 42 % (ref 36.0–46.0)
HGB: 13.7 g/dL (ref 11.6–15.9)
Hemoglobin: 14 g/dL (ref 12.0–15.0)
Lymphs Abs: 2.3 10*3/uL (ref 0.7–4.0)
MONO#: 0.7 10*3/uL (ref 0.1–0.9)
Monocytes Relative: 7.4 % (ref 3.0–12.0)
NEUT%: 66.1 % (ref 38.4–76.8)
Neutro Abs: 6.9 10*3/uL (ref 1.4–7.7)
RBC: 4.43 Mil/uL (ref 3.87–5.11)
RDW: 13.1 % (ref 11.2–14.5)
WBC: 10.1 10*3/uL (ref 3.9–10.3)
WBC: 10.3 10*3/uL (ref 4.5–10.5)
lymph#: 2.3 10*3/uL (ref 0.9–3.3)

## 2011-10-16 LAB — MICROALBUMIN / CREATININE URINE RATIO
Creatinine,U: 69.7 mg/dL
Microalb, Ur: 2.4 mg/dL — ABNORMAL HIGH (ref 0.0–1.9)

## 2011-10-16 LAB — BASIC METABOLIC PANEL
CO2: 30 mEq/L (ref 19–32)
Calcium: 8.9 mg/dL (ref 8.4–10.5)
Glucose, Bld: 123 mg/dL — ABNORMAL HIGH (ref 70–99)
Potassium: 3 mEq/L — ABNORMAL LOW (ref 3.5–5.1)
Sodium: 139 mEq/L (ref 135–145)

## 2011-10-16 LAB — TSH: TSH: 0.74 u[IU]/mL (ref 0.35–5.50)

## 2011-10-16 NOTE — Telephone Encounter (Signed)
gve the pt her oct 2013 appt calendar °

## 2011-10-16 NOTE — Progress Notes (Signed)
  OFFICE PROGRESS NOTE  CC Dr. Sharlet Salina Hoxworth Dr. Danna Hefty, MD, MD [address] [place] [address]  DIAGNOSIS: stage I infiltrating ductal carcinoma of the right breast diagnosed November 2008 11.  PRIOR THERAPY:  #1 patient is status post lumpectomy for a stage I infiltrating ductal carcinoma that was ER positive.  #2 patient then underwent accelerated breast radiation with MammoSite completed on 05/05/2010.  #3 she was then started on Arimidex 1 mg daily. This was started on January 2012.  CURRENT THERAPY:Arimidex 1 mg   INTERVAL HISTORY: Overall, doing well, tolerating Arimidex with very few side effects. Mild arthralgias in the hands. No hot flashes no vaginal dryness.  No lower extremity edema. No headache or blurred vision. No cough or shortness of breath. No abdominal pain or new bone pain. Bowel and bladder function are normal. Appetite is good, with adequate fluid intake. Remainder of the 10 point review of systems is negative.  Last mammo was Oct. 2012.   ALLERGIES:  is allergic to sulfonamide derivatives.   PHYSICAL EXAMINATION: General: Well developed, well nourished, in no acute distress.  EENT: No ocular or oral lesions. No stomatitis.  Respiratory: Lungs are clear to auscultation bilaterally with normal respiratory movement and no accessory muscle use. Cardiac: No murmur, rub or tachycardia. No upper or lower extremity edema.  GI: Abdomen is soft, no palpable hepatosplenomegaly. No fluid wave. No tenderness. Musculoskeletal: No kyphosis, no tenderness over the spine, ribs or hips. Lymph: No cervical, infraclavicular, axillary or inguinal adenopathy. Neuro: No focal neurological deficits. Psych: Alert and oriented X 3, appropriate mood and affect.  BREAST EXAM: In the supine position, with the right arm over the head, the right nipple is everted. No periareolar edema or nipple discharge. In the 12 o'clock position, just  superior to the areola, a 3 x 6 cm mass, reminiscent of seroma. This is close to the mammosite incision. Well healed remote incision from the 11-2 o'clock position. No redness of the skin. No right axillary adenopathy. With the left arm over the head, the left nipple is everted. No periareolar edema or nipple discharge. No mass in any quadrant or subareolar region. No redness of the skin. No left axillary adenopathy.    Bilateral breast examination is performed right breast reveals healed surgical scar there is no  Masses or nipple discharge or retraction or inversion. Left breast no masses or nipple discharge. ECOG PERFORMANCE STATUS: 1 - Symptomatic but completely ambulatory  Blood pressure 153/89, pulse 76, temperature 98.3 F (36.8 C), temperature source Oral, height 5' 2.5" (1.588 m), weight 197 lb 14.4 oz (89.767 kg).  LABORATORY DATA: Lab Results  Component Value Date   WBC 10.1 10/16/2011   HGB 13.7 10/16/2011   HCT 40.7 10/16/2011   MCV 93.1 10/16/2011   PLT 247 10/16/2011   ASSESSMENT:  1. Stage I infiltrating ductal carcinoma of the right breast originally diagnosed in November 2011. No evidence of recurrence.  2. On Arimidex with no appreciable side effects.  3. Labs have been stable (normal) for 2 years.    PLAN: 1. Return in 4 months to see Dr. Welton Flakes. Since labs have been historically normal, no lab needed.  2. Continue Arimidex. 3. Mammo in October 2013. Dr. Welton Flakes will order that on her next visit.   All questions were answered. The patient knows to call the clinic with any problems, questions or concerns.

## 2011-10-17 LAB — COMPREHENSIVE METABOLIC PANEL
ALT: 16 U/L (ref 0–35)
Chloride: 102 mEq/L (ref 96–112)
Creatinine, Ser: 0.59 mg/dL (ref 0.50–1.10)
Glucose, Bld: 133 mg/dL — ABNORMAL HIGH (ref 70–99)
Potassium: 3.3 mEq/L — ABNORMAL LOW (ref 3.5–5.3)
Sodium: 141 mEq/L (ref 135–145)
Total Bilirubin: 0.5 mg/dL (ref 0.3–1.2)

## 2011-10-19 ENCOUNTER — Encounter: Payer: Medicare Other | Admitting: Family

## 2011-11-05 ENCOUNTER — Ambulatory Visit: Payer: Medicare HMO | Admitting: Radiation Oncology

## 2011-12-03 ENCOUNTER — Telehealth (INDEPENDENT_AMBULATORY_CARE_PROVIDER_SITE_OTHER): Payer: Self-pay | Admitting: General Surgery

## 2011-12-03 NOTE — Telephone Encounter (Signed)
PT CALLED RE A MESSAGE SHE RECEIVED YESTERDAY RE FOLLOW UP APPOINTMENT WITH DR. HOXWORTH. SHE IS IN [place] AND WILL CALL BACK TOMORROW/ PHONE [phone]/GY

## 2011-12-04 NOTE — Telephone Encounter (Signed)
Called and left message for Ms. Aron @ [phone] with her 6 month follow up appointment for 12/07/11 @ 4:20pm w/Dr. Johna Sheriff.

## 2011-12-07 ENCOUNTER — Encounter (INDEPENDENT_AMBULATORY_CARE_PROVIDER_SITE_OTHER): Payer: Medicare Other | Admitting: General Surgery

## 2011-12-08 ENCOUNTER — Telehealth: Payer: Self-pay | Admitting: Internal Medicine

## 2011-12-08 ENCOUNTER — Encounter (INDEPENDENT_AMBULATORY_CARE_PROVIDER_SITE_OTHER): Payer: Self-pay | Admitting: General Surgery

## 2011-12-08 MED ORDER — HYDROCODONE-ACETAMINOPHEN 7.5-500 MG PO TABS: 1.0000 | ORAL_TABLET | Freq: Three times a day (TID) | ORAL | Status: DC | PRN

## 2011-12-08 NOTE — Telephone Encounter (Signed)
Patient called stating that she need a refill of her Lortab faxed into [facility] in [facility] ph. [phone] as patient is vacationing there. Please advise.

## 2012-01-25 ENCOUNTER — Other Ambulatory Visit (HOSPITAL_COMMUNITY)
Admission: RE | Admit: 2012-01-25 | Discharge: 2012-01-25 | Disposition: A | Discharge status: Home / Self Care | Payer: Medicare Other | Source: Ambulatory Visit | Attending: Internal Medicine | Admitting: Internal Medicine

## 2012-01-25 ENCOUNTER — Encounter: Payer: Self-pay | Admitting: Internal Medicine

## 2012-01-25 ENCOUNTER — Ambulatory Visit (INDEPENDENT_AMBULATORY_CARE_PROVIDER_SITE_OTHER): Payer: Medicare Other | Admitting: Internal Medicine

## 2012-01-25 VITALS — BP 144/90 | HR 76 | Temp 98.3°F | Resp 16 | Ht 63.0 in | Wt 194.0 lb

## 2012-01-25 DIAGNOSIS — I10 Essential (primary) hypertension: Secondary | ICD-10-CM

## 2012-01-25 DIAGNOSIS — Z0000 Encounter for general adult medical examination without abnormal findings: Secondary | ICD-10-CM

## 2012-01-25 DIAGNOSIS — E1165 Type 2 diabetes mellitus with hyperglycemia: Secondary | ICD-10-CM

## 2012-01-25 DIAGNOSIS — F419 Anxiety disorder, unspecified: Secondary | ICD-10-CM

## 2012-01-25 DIAGNOSIS — IMO0002 Reserved for concepts with insufficient information to code with codable children: Secondary | ICD-10-CM

## 2012-01-25 DIAGNOSIS — Z23 Encounter for immunization: Secondary | ICD-10-CM

## 2012-01-25 DIAGNOSIS — E876 Hypokalemia: Secondary | ICD-10-CM

## 2012-01-25 DIAGNOSIS — Z124 Encounter for screening for malignant neoplasm of cervix: Secondary | ICD-10-CM | POA: Insufficient documentation

## 2012-01-25 MED ORDER — POTASSIUM CHLORIDE ER 10 MEQ PO TBCR: 10.0000 meq | EXTENDED_RELEASE_TABLET | Freq: Two times a day (BID) | ORAL | Status: DC

## 2012-01-25 MED ORDER — HYDROCODONE-ACETAMINOPHEN 7.5-500 MG PO TABS: 1.0000 | ORAL_TABLET | Freq: Three times a day (TID) | ORAL | Status: DC | PRN

## 2012-01-25 MED ORDER — LORAZEPAM 1 MG PO TABS: 1.0000 mg | ORAL_TABLET | Freq: Three times a day (TID) | ORAL | Status: DC | PRN

## 2012-01-25 NOTE — Progress Notes (Signed)
  Subjective:    Patient ID: [name], female    DOB: [birth year], 71 y.o.   MRN: [id number]  HPI  This is a 71 year old female who presents for a wellness examination.  She has a history of breast cancer status post a right lumpectomy with significant scar tissue from partial breast radiation she has a history of diabetes moderately controlled with an A1c between 7 and 8 she's a history of chronic obesity she has a history of hypokalemia secondary to both her diabetes and hypertensive medications  Review of Systems  Constitutional: Negative for activity change, appetite change and fatigue.  HENT: Negative for ear pain, congestion, neck pain, postnasal drip and sinus pressure.   Eyes: Negative for redness and visual disturbance.  Respiratory: Negative for cough, shortness of breath and wheezing.   Gastrointestinal: Negative for abdominal pain and abdominal distention.  Genitourinary: Negative for dysuria, frequency and menstrual problem.  Musculoskeletal: Negative for myalgias, joint swelling and arthralgias.  Skin: Negative for rash and wound.  Neurological: Negative for dizziness, weakness and headaches.  Hematological: Negative for adenopathy. Does not bruise/bleed easily.  Psychiatric/Behavioral: Negative for disturbed wake/sleep cycle and decreased concentration.       Objective:   Physical Exam  Nursing note and vitals reviewed. Constitutional: She is oriented to person, place, and time. She appears well-developed and well-nourished. No distress.  HENT:  Head: Normocephalic and atraumatic.  Right Ear: External ear normal.  Left Ear: External ear normal.  Nose: Nose normal.  Mouth/Throat: Oropharynx is clear and moist.  Eyes: Conjunctivae and EOM are normal. Pupils are equal, round, and reactive to light.  Neck: Normal range of motion. Neck supple. No JVD present. No tracheal deviation present. No thyromegaly present.  Cardiovascular: Normal rate, regular rhythm, normal  heart sounds and intact distal pulses.   No murmur heard. Pulmonary/Chest: Effort normal and breath sounds normal. She has no wheezes. She exhibits no tenderness.  Abdominal: Soft. Bowel sounds are normal.  Musculoskeletal: Normal range of motion. She exhibits no edema and no tenderness.  Lymphadenopathy:    She has no cervical adenopathy.  Neurological: She is alert and oriented to person, place, and time. She has normal reflexes. No cranial nerve deficit.  Skin: Skin is warm and dry. She is not diaphoretic.  Psychiatric: She has a normal mood and affect. Her behavior is normal.     Breast examination reveals focal thickening of the right breast at the site of radiation.  Bimanual examination revealed no adnexal masses Pap smear was obtained     Assessment & Plan:   This is a routine physical examination for this healthy  Female. Reviewed all health maintenance protocols including mammography colonoscopy bone density and reviewed appropriate screening labs. Her immunization history was reviewed as well as her current medications and allergies refills of her chronic medications were given and the plan for yearly health maintenance was discussed all orders and referrals were made as appropriate.   Persistent hypokalemia begin potassium 10 mEq by mouth daily.  History of breast cancer encouraged followup with sagittal surgeon to examine the site of the radiation and a breast thickening at that site.  Poorly controlled diabetes diet and exercise and weight loss her primary interventions.   Actinic keratosis cryotherapy to 3 lesions 1 on the top of the right ear one on the left forearm and one on the right forearm

## 2012-01-25 NOTE — Addendum Note (Signed)
Addended by: Willy Eddy on: 01/25/2012 05:41 PM   Modules accepted: Orders

## 2012-01-25 NOTE — Patient Instructions (Signed)
The patient is instructed to continue all medications as prescribed. Schedule followup with check out clerk upon leaving the clinic   The new medication is potassium 10 mEq take one daily

## 2012-02-01 ENCOUNTER — Telehealth: Payer: Self-pay | Admitting: *Deleted

## 2012-02-01 NOTE — Telephone Encounter (Signed)
Pt informed wnl pap

## 2012-02-05 ENCOUNTER — Ambulatory Visit (INDEPENDENT_AMBULATORY_CARE_PROVIDER_SITE_OTHER): Payer: Medicare Other | Admitting: General Surgery

## 2012-02-15 ENCOUNTER — Other Ambulatory Visit: Payer: Self-pay | Admitting: Internal Medicine

## 2012-02-15 DIAGNOSIS — Z853 Personal history of malignant neoplasm of breast: Secondary | ICD-10-CM

## 2012-03-04 ENCOUNTER — Ambulatory Visit (HOSPITAL_BASED_OUTPATIENT_CLINIC_OR_DEPARTMENT_OTHER): Payer: Medicare Other | Admitting: Lab

## 2012-03-04 ENCOUNTER — Encounter: Payer: Self-pay | Admitting: Oncology

## 2012-03-04 ENCOUNTER — Ambulatory Visit (HOSPITAL_BASED_OUTPATIENT_CLINIC_OR_DEPARTMENT_OTHER): Payer: Medicare Other | Admitting: Oncology

## 2012-03-04 VITALS — BP 154/82 | HR 85 | Temp 98.3°F | Resp 20 | Ht 63.0 in | Wt 193.4 lb

## 2012-03-04 DIAGNOSIS — M25549 Pain in joints of unspecified hand: Secondary | ICD-10-CM

## 2012-03-04 DIAGNOSIS — Z170 Estrogen receptor positive status [ER+]: Secondary | ICD-10-CM

## 2012-03-04 DIAGNOSIS — C50919 Malignant neoplasm of unspecified site of unspecified female breast: Secondary | ICD-10-CM

## 2012-03-04 DIAGNOSIS — C50419 Malignant neoplasm of upper-outer quadrant of unspecified female breast: Secondary | ICD-10-CM

## 2012-03-04 LAB — CBC WITH DIFFERENTIAL/PLATELET
Basophils Absolute: 0 10*3/uL (ref 0.0–0.1)
Eosinophils Absolute: 0.3 10*3/uL (ref 0.0–0.5)
HCT: 43.2 % (ref 34.8–46.6)
HGB: 14.6 g/dL (ref 11.6–15.9)
LYMPH%: 20.6 % (ref 14.0–49.7)
MCV: 91.7 fL (ref 79.5–101.0)
MONO#: 0.7 10*3/uL (ref 0.1–0.9)
MONO%: 7.2 % (ref 0.0–14.0)
NEUT#: 6.7 10*3/uL — ABNORMAL HIGH (ref 1.5–6.5)
NEUT%: 68.4 % (ref 38.4–76.8)
Platelets: 250 10*3/uL (ref 145–400)
WBC: 9.8 10*3/uL (ref 3.9–10.3)

## 2012-03-04 LAB — COMPREHENSIVE METABOLIC PANEL (CC13)
Alkaline Phosphatase: 93 U/L (ref 40–150)
BUN: 11 mg/dL (ref 7.0–26.0)
CO2: 26 mEq/L (ref 22–29)
Glucose: 145 mg/dl — ABNORMAL HIGH (ref 70–99)
Total Bilirubin: 0.7 mg/dL (ref 0.20–1.20)
Total Protein: 7 g/dL (ref 6.4–8.3)

## 2012-03-04 MED ORDER — ANASTROZOLE 1 MG PO TABS: 1.0000 mg | ORAL_TABLET | Freq: Every day | ORAL | Status: AC

## 2012-03-04 NOTE — Progress Notes (Signed)
OFFICE PROGRESS NOTE  CC Dr. Sharlet Salina Hoxworth Dr. Danna Hefty, MD [address] [place] [address]  DIAGNOSIS: stage I infiltrating ductal carcinoma of the right breast diagnosed November 2008 11.  PRIOR THERAPY:  #1 patient is status post lumpectomy for a stage I infiltrating ductal carcinoma that was ER positive.  #2 patient then underwent accelerated breast radiation with MammoSite completed on 05/05/2010.  #3 she was then started on Arimidex 1 mg daily. This was started on January 2012.  CURRENT THERAPY:Arimidex 1 mg   INTERVAL HISTORY: Overall, doing well, tolerating Arimidex with very few side effects. Mild arthralgias in the hands. No hot flashes no vaginal dryness.  No lower extremity edema. No headache or blurred vision. No cough or shortness of breath. No abdominal pain or new bone pain. Bowel and bladder function are normal. Appetite is good, with adequate fluid intake. Remainder of the 10 point review of systems is negative.  Last mammo was Oct. 2012.   ALLERGIES:  is allergic to sulfonamide derivatives.   PHYSICAL EXAMINATION: General: Well developed, well nourished, in no acute distress.  EENT: No ocular or oral lesions. No stomatitis.  Respiratory: Lungs are clear to auscultation bilaterally with normal respiratory movement and no accessory muscle use. Cardiac: No murmur, rub or tachycardia. No upper or lower extremity edema.  GI: Abdomen is soft, no palpable hepatosplenomegaly. No fluid wave. No tenderness. Musculoskeletal: No kyphosis, no tenderness over the spine, ribs or hips. Lymph: No cervical, infraclavicular, axillary or inguinal adenopathy. Neuro: No focal neurological deficits. Psych: Alert and oriented X 3, appropriate mood and affect.  BREAST EXAM: In the supine position, with the right arm over the head, the right nipple is everted. No periareolar edema or nipple discharge. In the 12 o'clock position, just  superior to the areola, a 3 x 6 cm mass, reminiscent of seroma. This is close to the mammosite incision. Well healed remote incision from the 11-2 o'clock position. No redness of the skin. No right axillary adenopathy. With the left arm over the head, the left nipple is everted. No periareolar edema or nipple discharge. No mass in any quadrant or subareolar region. No redness of the skin. No left axillary adenopathy.    Bilateral breast examination is performed right breast reveals healed surgical scar there is no  Masses or nipple discharge or retraction or inversion. Left breast no masses or nipple discharge. ECOG PERFORMANCE STATUS: 1 - Symptomatic but completely ambulatory  Blood pressure 154/82, pulse 85, temperature 98.3 F (36.8 C), temperature source Oral, resp. rate 20, height 5\' 3"  (1.6 m), weight 193 lb 6.4 oz (87.726 kg).  LABORATORY DATA: Lab Results  Component Value Date   WBC 10.3 10/16/2011   HGB 14.0 10/16/2011   HCT 42.0 10/16/2011   MCV 94.9 10/16/2011   PLT 253.0 10/16/2011   ASSESSMENT:  1. Stage I infiltrating ductal carcinoma of the right breast originally diagnosed in November 2011. No evidence of recurrence. Patient will continue Arimidex 1 mg daily she is tolerating it well.   PLAN: #1 patient will continue Arimidex 1 mg daily. We will check her CBC and liver function studies on her next visit.  #2 she will be seen back in 6 months in followup.  All questions were answered. The patient knows to call the clinic with any problems, questions or concerns.The length of time of the face-to-face encounter was 30  minutes. More than 50% of time was spent counseling and coordination of care.

## 2012-03-04 NOTE — Patient Instructions (Addendum)
Doing well, continue taking arimidex daily.  We will see you back in May 2014  Please call with any problems

## 2012-03-05 LAB — VITAMIN D 25 HYDROXY (VIT D DEFICIENCY, FRACTURES): Vit D, 25-Hydroxy: 37 ng/mL (ref 30–89)

## 2012-03-07 ENCOUNTER — Other Ambulatory Visit: Payer: Self-pay | Admitting: *Deleted

## 2012-03-07 ENCOUNTER — Ambulatory Visit
Admission: RE | Admit: 2012-03-07 | Discharge: 2012-03-07 | Disposition: A | Discharge status: Home / Self Care | Payer: Medicare Other | Source: Ambulatory Visit | Attending: Internal Medicine | Admitting: Internal Medicine

## 2012-03-07 DIAGNOSIS — Z853 Personal history of malignant neoplasm of breast: Secondary | ICD-10-CM

## 2012-03-08 ENCOUNTER — Telehealth: Payer: Self-pay | Admitting: *Deleted

## 2012-03-08 NOTE — Telephone Encounter (Signed)
Message copied by Cooper Render on Tue Mar 08, 2012 11:13 AM ------      Message from: [name]      Created: Mon Mar 07, 2012 11:57 PM       Call patient: take vitamin D3 2000 iu daily

## 2012-03-08 NOTE — Telephone Encounter (Signed)
Per MD, notified pt to take Vitamin D3 2000iu daily. Pt verbalized understanding

## 2012-03-24 ENCOUNTER — Ambulatory Visit (INDEPENDENT_AMBULATORY_CARE_PROVIDER_SITE_OTHER): Payer: Medicare Other | Admitting: General Surgery

## 2012-04-15 ENCOUNTER — Other Ambulatory Visit: Payer: Self-pay | Admitting: Internal Medicine

## 2012-05-26 ENCOUNTER — Ambulatory Visit: Payer: Medicare Other | Admitting: Internal Medicine

## 2012-05-27 ENCOUNTER — Telehealth (INDEPENDENT_AMBULATORY_CARE_PROVIDER_SITE_OTHER): Payer: Self-pay

## 2012-05-27 ENCOUNTER — Telehealth (INDEPENDENT_AMBULATORY_CARE_PROVIDER_SITE_OTHER): Payer: Self-pay | Admitting: General Surgery

## 2012-05-27 NOTE — Telephone Encounter (Signed)
RX for Lumpectomy bra (3) faxed to [facility] @ [phone]

## 2012-05-27 NOTE — Telephone Encounter (Signed)
Pt called to request Rx be FAXd to [facility] for lumpectomy bras.  Will ask Dr. Johna Sheriff to sign, then FAX.

## 2012-06-20 ENCOUNTER — Ambulatory Visit (INDEPENDENT_AMBULATORY_CARE_PROVIDER_SITE_OTHER): Payer: Medicare Other | Admitting: Family Medicine

## 2012-06-20 ENCOUNTER — Encounter: Payer: Self-pay | Admitting: Family Medicine

## 2012-06-20 VITALS — BP 140/90 | HR 90 | Temp 98.6°F | Ht 62.0 in | Wt 205.0 lb

## 2012-06-20 DIAGNOSIS — L309 Dermatitis, unspecified: Secondary | ICD-10-CM

## 2012-06-20 DIAGNOSIS — L259 Unspecified contact dermatitis, unspecified cause: Secondary | ICD-10-CM

## 2012-06-20 MED ORDER — TRIAMCINOLONE ACETONIDE 0.1 % EX CREA: TOPICAL_CREAM | Freq: Two times a day (BID) | CUTANEOUS | Status: DC

## 2012-06-20 NOTE — Progress Notes (Signed)
Chief Complaint  Patient presents with  . Rash    neck and arm    HPI:  Acute visit for rash: -off and on for two months -itchy on neck chest, back and arms -only new medication is potassium -has tried Vagicaine on it -has been using a new body wash - this is the only new thing she can think of  ROS: See pertinent positives and negatives per HPI.  Past Medical History  Diagnosis Date  . Diabetes mellitus   . Hypertension   . Arthritis   . Hearing loss   . Cancer     right breast     Family History  Problem Relation Age of Onset  . Cancer Sister     breast  . Heart disease Paternal Grandfather     History   Social History  . Marital Status: Married    Spouse Name: N/A    Number of Children: N/A  . Years of Education: N/A   Social History Main Topics  . Smoking status: Never Smoker   . Smokeless tobacco: None  . Alcohol Use: No  . Drug Use: No  . Sexually Active: Yes   Other Topics Concern  . None   Social History Narrative  . None    Current outpatient prescriptions:amLODipine (NORVASC) 10 MG tablet, Take 1 tablet by mouth  daily, Disp: 90 tablet, Rfl: 0;  glucose blood (FREESTYLE LITE) test strip, Use as instructed, Disp: 100 each, Rfl: 3;  HYDROcodone-acetaminophen (LORTAB) 7.5-500 MG per tablet, Take 1 tablet by mouth every 8 (eight) hours as needed for pain., Disp: 30 tablet, Rfl: 1 lisinopril-hydrochlorothiazide (PRINZIDE,ZESTORETIC) 20-25 MG per tablet, Take 1 tablet by mouth  daily, Disp: 90 tablet, Rfl: 0;  metFORMIN (GLUCOPHAGE) 500 MG tablet, Take 3 tablets by mouth at  bedtime, Disp: 270 tablet, Rfl: 0;  LORazepam (ATIVAN) 1 MG tablet, Take 1 tablet (1 mg total) by mouth every 8 (eight) hours as needed for anxiety., Disp: 90 tablet, Rfl: 3 potassium chloride (K-TABS) 10 MEQ tablet, Take 1 tablet (10 mEq total) by mouth 2 (two) times daily., Disp: 90 tablet, Rfl: 3;  pramipexole (MIRAPEX) 1 MG tablet, Take 0.5 tablets (0.5 mg total) by mouth at  bedtime., Disp: 90 tablet, Rfl: 3;  triamcinolone cream (KENALOG) 0.1 %, Apply topically 2 (two) times daily., Disp: 30 g, Rfl: 0;  VESICARE 10 MG tablet, Take 1 tablet by mouth  daily, Disp: 90 each, Rfl: 0  EXAM:  Filed Vitals:   06/20/12 1448  BP: 140/90  Pulse: 90  Temp: 98.6 F (37 C)    Body mass index is 37.49 kg/(m^2).  GENERAL: vitals reviewed and listed above, alert, oriented, appears well hydrated and in no acute distress  HEENT: atraumatic, conjunttiva clear, no obvious abnormalities on inspection of external nose and ears  NECK: no obvious masses on inspection  SKIN: macular papular erythematous rash on upper chest, neck and upper back and near cuff area on arms  MS: moves all extremities without noticeable abnormality  PSYCH: pleasant and cooperative, no obvious depression or anxiety  ASSESSMENT AND PLAN:  Discussed the following assessment and plan:  1. Dermatitis  triamcinolone cream (KENALOG) 0.1 %   -likely contact dermatitis per appearance and description - recs per below and orders -Patient advised to return or notify a doctor immediately if symptoms worsen or persist or new concerns arise.  Patient Instructions  -take zyrtec nightly  -use the steroid cream provided twice daily for the first 1-2  weeks  -use ONLY hypoallergenic soaps and detergent with no scent and no color - discontinue the body wash  -follow up with your doctor in 1 month     Ellard Nan, [facility] R.

## 2012-06-20 NOTE — Patient Instructions (Signed)
-  take zyrtec nightly  -use the steroid cream provided twice daily for the first 1-2 weeks  -use ONLY hypoallergenic soaps and detergent with no scent and no color - discontinue the body wash  -follow up with your doctor in 1 month

## 2012-07-11 ENCOUNTER — Telehealth: Payer: Self-pay | Admitting: Internal Medicine

## 2012-07-11 ENCOUNTER — Telehealth: Payer: Self-pay | Admitting: *Deleted

## 2012-07-11 DIAGNOSIS — I10 Essential (primary) hypertension: Secondary | ICD-10-CM

## 2012-07-11 DIAGNOSIS — E876 Hypokalemia: Secondary | ICD-10-CM

## 2012-07-11 MED ORDER — LISINOPRIL-HYDROCHLOROTHIAZIDE 20-25 MG PO TABS: 1.0000 | ORAL_TABLET | Freq: Every day | ORAL | Status: DC

## 2012-07-11 MED ORDER — POTASSIUM CHLORIDE ER 10 MEQ PO TBCR: 10.0000 meq | EXTENDED_RELEASE_TABLET | Freq: Two times a day (BID) | ORAL | Status: DC

## 2012-07-11 MED ORDER — AMLODIPINE BESYLATE 10 MG PO TABS: 10.0000 mg | ORAL_TABLET | Freq: Every day | ORAL | Status: DC

## 2012-07-11 MED ORDER — SOLIFENACIN SUCCINATE 10 MG PO TABS: 10.0000 mg | ORAL_TABLET | Freq: Every day | ORAL | Status: DC

## 2012-07-11 MED ORDER — METFORMIN HCL 500 MG PO TABS: ORAL_TABLET | ORAL | Status: DC

## 2012-07-11 NOTE — Telephone Encounter (Signed)
Pt is switching to [facility] phone # [phone] please sent all maintenance medication for 90 day with refills

## 2012-07-11 NOTE — Telephone Encounter (Signed)
Sent in

## 2012-07-11 NOTE — Telephone Encounter (Signed)
done

## 2012-07-25 ENCOUNTER — Telehealth: Payer: Self-pay | Admitting: Internal Medicine

## 2012-07-25 DIAGNOSIS — G2581 Restless legs syndrome: Secondary | ICD-10-CM

## 2012-07-25 MED ORDER — PRAMIPEXOLE DIHYDROCHLORIDE 1 MG PO TABS: 0.5000 mg | ORAL_TABLET | Freq: Every day | ORAL | Status: DC

## 2012-07-25 NOTE — Telephone Encounter (Signed)
Okay to fill anastrozole ?

## 2012-07-25 NOTE — Telephone Encounter (Signed)
Patient aware.

## 2012-07-25 NOTE — Telephone Encounter (Signed)
Patient called stating that she need a refill of her anastrozole 1mg  1poqd and pramipexole 1 mg tab 0.5 mg tab po at bedtime sent to [facility]. Please assist.

## 2012-07-25 NOTE — Telephone Encounter (Signed)
Anastrozole should be filled at the cancer center

## 2012-07-28 ENCOUNTER — Other Ambulatory Visit: Payer: Self-pay | Admitting: Internal Medicine

## 2012-07-29 ENCOUNTER — Other Ambulatory Visit: Payer: Self-pay | Admitting: Emergency Medicine

## 2012-07-29 MED ORDER — ANASTROZOLE 1 MG PO TABS: 1.0000 mg | ORAL_TABLET | Freq: Every day | ORAL | Status: DC

## 2012-08-12 ENCOUNTER — Encounter: Payer: Self-pay | Admitting: Internal Medicine

## 2012-08-12 ENCOUNTER — Ambulatory Visit (INDEPENDENT_AMBULATORY_CARE_PROVIDER_SITE_OTHER): Payer: Medicare Other | Admitting: Internal Medicine

## 2012-08-12 VITALS — BP 150/80 | HR 76 | Temp 98.3°F | Resp 16 | Ht 62.0 in | Wt 200.0 lb

## 2012-08-12 DIAGNOSIS — E1059 Type 1 diabetes mellitus with other circulatory complications: Secondary | ICD-10-CM

## 2012-08-12 DIAGNOSIS — M1710 Unilateral primary osteoarthritis, unspecified knee: Secondary | ICD-10-CM

## 2012-08-12 DIAGNOSIS — G4700 Insomnia, unspecified: Secondary | ICD-10-CM

## 2012-08-12 DIAGNOSIS — R300 Dysuria: Secondary | ICD-10-CM

## 2012-08-12 LAB — POCT URINALYSIS DIPSTICK
Bilirubin, UA: NEGATIVE
Glucose, UA: NEGATIVE
Nitrite, UA: NEGATIVE
Urobilinogen, UA: 0.2

## 2012-08-12 MED ORDER — TEMAZEPAM 15 MG PO CAPS: 15.0000 mg | ORAL_CAPSULE | Freq: Every evening | ORAL | Status: DC | PRN

## 2012-08-12 NOTE — Progress Notes (Signed)
  Subjective:    Patient ID: [name], female    DOB: Feb 28, 1941, 72 y.o.   MRN: [id number]  HPI Not wearing hearing aids Mild dysuria Knee pain and mobility issues    Review of Systems  Constitutional: Negative for activity change, appetite change and fatigue.  HENT: Negative for ear pain, congestion, neck pain, postnasal drip and sinus pressure.   Eyes: Negative for redness and visual disturbance.  Respiratory: Negative for cough, shortness of breath and wheezing.   Gastrointestinal: Negative for abdominal pain and abdominal distention.  Genitourinary: Negative for dysuria, frequency and menstrual problem.  Musculoskeletal: Positive for myalgias, joint swelling and gait problem. Negative for arthralgias.  Skin: Negative for rash and wound.  Neurological: Negative for dizziness, weakness and headaches.  Hematological: Negative for adenopathy. Does not bruise/bleed easily.  Psychiatric/Behavioral: Negative for sleep disturbance and decreased concentration.       Objective:   Physical Exam  Nursing note and vitals reviewed. Constitutional: She is oriented to person, place, and time. She appears well-developed and well-nourished. No distress.  HENT:  Head: Normocephalic and atraumatic.  Eyes: Conjunctivae and EOM are normal. Pupils are equal, round, and reactive to light.  Neck: Normal range of motion. Neck supple. No JVD present. No tracheal deviation present. No thyromegaly present.  Cardiovascular: Normal rate and regular rhythm.   Murmur heard. Pulmonary/Chest: Effort normal and breath sounds normal. She has no wheezes. She exhibits no tenderness.  Abdominal: Soft. Bowel sounds are normal.  Musculoskeletal: Normal range of motion. She exhibits no edema and no tenderness.  Lymphadenopathy:    She has no cervical adenopathy.  Neurological: She is alert and oriented to person, place, and time. She has normal reflexes. No cranial nerve deficit.  Skin: Skin is warm and dry.  She is not diaphoretic.  Psychiatric: She has a normal mood and affect. Her behavior is normal.    knee pain and swelling       Assessment & Plan:  Knee pain Injection with depomedrol for test  Informed consent obtained and the patient's knee was prepped with betadine. Local anesthesia was obtained with topical spray. Then 40 mg of Depo-Medrol and 1/2 cc of lidocaine was injected into the joint space. The patient tolerated the procedure without complications. Post injection care discussed with patient.   Sleep issues ( worry)

## 2012-08-12 NOTE — Patient Instructions (Signed)
The patient is instructed to continue all medications as prescribed. Schedule followup with check out clerk upon leaving the clinic  

## 2012-08-15 LAB — URINE CULTURE: Colony Count: 10000

## 2012-09-12 ENCOUNTER — Other Ambulatory Visit: Payer: Self-pay | Admitting: Internal Medicine

## 2012-09-30 ENCOUNTER — Encounter: Payer: Self-pay | Admitting: Radiation Oncology

## 2012-10-03 ENCOUNTER — Ambulatory Visit: Payer: Medicare Other | Admitting: Oncology

## 2012-10-03 ENCOUNTER — Other Ambulatory Visit: Payer: Medicare Other | Admitting: Lab

## 2012-10-06 ENCOUNTER — Ambulatory Visit: Payer: Medicare Other | Admitting: Radiation Oncology

## 2012-10-06 ENCOUNTER — Encounter: Payer: Self-pay | Admitting: Radiation Oncology

## 2012-10-06 ENCOUNTER — Ambulatory Visit
Admission: RE | Admit: 2012-10-06 | Discharge: 2012-10-06 | Disposition: A | Discharge status: Home / Self Care | Payer: Medicare Other | Source: Ambulatory Visit | Attending: Radiation Oncology | Admitting: Radiation Oncology

## 2012-10-06 ENCOUNTER — Encounter: Payer: Self-pay | Admitting: Oncology

## 2012-10-06 ENCOUNTER — Telehealth: Payer: Self-pay | Admitting: Oncology

## 2012-10-06 ENCOUNTER — Other Ambulatory Visit (HOSPITAL_BASED_OUTPATIENT_CLINIC_OR_DEPARTMENT_OTHER): Payer: Medicare Other | Admitting: Lab

## 2012-10-06 ENCOUNTER — Ambulatory Visit (HOSPITAL_BASED_OUTPATIENT_CLINIC_OR_DEPARTMENT_OTHER): Payer: Medicare Other | Admitting: Oncology

## 2012-10-06 VITALS — BP 144/85 | HR 76 | Temp 98.3°F | Resp 20 | Ht 62.0 in | Wt 194.4 lb

## 2012-10-06 VITALS — Wt 196.0 lb

## 2012-10-06 DIAGNOSIS — C50219 Malignant neoplasm of upper-inner quadrant of unspecified female breast: Secondary | ICD-10-CM

## 2012-10-06 DIAGNOSIS — C50919 Malignant neoplasm of unspecified site of unspecified female breast: Secondary | ICD-10-CM

## 2012-10-06 DIAGNOSIS — Z170 Estrogen receptor positive status [ER+]: Secondary | ICD-10-CM

## 2012-10-06 LAB — COMPREHENSIVE METABOLIC PANEL (CC13)
ALT: 18 U/L (ref 0–55)
AST: 20 U/L (ref 5–34)
Albumin: 3.6 g/dL (ref 3.5–5.0)
Alkaline Phosphatase: 85 U/L (ref 40–150)
BUN: 14.6 mg/dL (ref 7.0–26.0)
CO2: 31 meq/L — ABNORMAL HIGH (ref 22–29)
Calcium: 9.3 mg/dL (ref 8.4–10.4)
Chloride: 99 meq/L (ref 98–107)
Creatinine: 0.7 mg/dL (ref 0.6–1.1)
Glucose: 150 mg/dL — ABNORMAL HIGH (ref 70–99)
Potassium: 3.5 meq/L (ref 3.5–5.1)
Sodium: 139 meq/L (ref 136–145)
Total Bilirubin: 0.5 mg/dL (ref 0.20–1.20)
Total Protein: 7.3 g/dL (ref 6.4–8.3)

## 2012-10-06 LAB — CBC WITH DIFFERENTIAL/PLATELET
BASO%: 0.3 % (ref 0.0–2.0)
Basophils Absolute: 0 10e3/uL (ref 0.0–0.1)
EOS%: 2.6 % (ref 0.0–7.0)
Eosinophils Absolute: 0.3 10e3/uL (ref 0.0–0.5)
HCT: 40.5 % (ref 34.8–46.6)
HGB: 13.7 g/dL (ref 11.6–15.9)
LYMPH%: 20.1 % (ref 14.0–49.7)
MCH: 31.5 pg (ref 25.1–34.0)
MCHC: 33.8 g/dL (ref 31.5–36.0)
MCV: 93.2 fL (ref 79.5–101.0)
MONO#: 1 10e3/uL — ABNORMAL HIGH (ref 0.1–0.9)
MONO%: 9.5 % (ref 0.0–14.0)
NEUT#: 6.9 10e3/uL — ABNORMAL HIGH (ref 1.5–6.5)
NEUT%: 67.5 % (ref 38.4–76.8)
Platelets: 247 10e3/uL (ref 145–400)
RBC: 4.35 10e6/uL (ref 3.70–5.45)
RDW: 13.5 % (ref 11.2–14.5)
WBC: 10.2 10e3/uL (ref 3.9–10.3)
lymph#: 2 10e3/uL (ref 0.9–3.3)

## 2012-10-06 MED ORDER — ANASTROZOLE 1 MG PO TABS: 1.0000 mg | ORAL_TABLET | Freq: Every day | ORAL | Status: DC

## 2012-10-06 NOTE — Progress Notes (Signed)
F/u appt s/p rad txs: right breast:04/29/10-05/05/10 34GY/108fx Alert,oriented x3, saw Dr. Welton Flakes today with labs, vitals done upstairs, ,did take oxygen sats, was alittle sob,95%room air sats, slow steady gait, no pain in breast, last mammogram 03/07/12 hasn't rescheduled this years as yet, appetite good, fatigued 1:22 PM

## 2012-10-06 NOTE — Telephone Encounter (Signed)
, °

## 2012-10-06 NOTE — Patient Instructions (Addendum)
#  1 continue Arimidex 1 mg daily. I have sent a prescription to your pharmacy in [place].  #2 I have ordered a bone density scan for you since you are on Arimidex and that can cause thinning of the bones.  #3 I will see you back in 6 months time.

## 2012-10-06 NOTE — Progress Notes (Signed)
   Department of Radiation Oncology  Phone:  [phone] Fax:        [phone]   Name: [name] MRN: [id number]  DOB: 12/08/1940  Date: 10/06/2012  Follow Up Visit Note  Diagnosis: T1N0 right breast cancer  Summary and Interval since last radiation: almost 3 years (mammosite finished 05/05/10)  Interval History: [name] presents today for routine followup.  She is doing well from her breast standpoint. She is on Arimidex. She had mammograms in October which were negative. This showed a decreased in size of her seroma cavity. She saw Dr. Welton Flakes earlier today. Her husband has unfortunately been diagnosed with seizures of unknown etiology. This has limited her traveling. She has less breast pain. She occasionally has shooting pain in the breast but this was only 5 minutes and is very rare.  Allergies:  Allergies  Allergen Reactions  . Sulfonamide Derivatives     Medications:  Current Outpatient Prescriptions  Medication Sig Dispense Refill  . amLODipine (NORVASC) 10 MG tablet Take 1 tablet (10 mg total) by mouth daily.  90 tablet  3  . anastrozole (ARIMIDEX) 1 MG tablet Take 1 tablet (1 mg total) by mouth daily.  30 tablet  8  . glucose blood (FREESTYLE LITE) test strip Use as instructed  100 each  3  . HYDROcodone-acetaminophen (NORCO) 7.5-325 MG per tablet TAKE ONE TABLET BY MOUTH EVERY 8 HOURS AS NEEDED FOR PAIN  30 tablet  0  . lisinopril-hydrochlorothiazide (PRINZIDE,ZESTORETIC) 20-25 MG per tablet Take 1 tablet by mouth daily.  90 tablet  3  . LORazepam (ATIVAN) 1 MG tablet Take 1 tablet (1 mg total) by mouth every 8 (eight) hours as needed for anxiety.  90 tablet  3  . metFORMIN (GLUCOPHAGE) 500 MG tablet 3 tabs  Daily  270 tablet  3  . potassium chloride (K-TABS) 10 MEQ tablet Take 1 tablet (10 mEq total) by mouth 2 (two) times daily.  180 tablet  3  . pramipexole (MIRAPEX) 1 MG tablet Take 0.5 tablets (0.5 mg total) by mouth at bedtime.  90 tablet  3  . solifenacin  (VESICARE) 10 MG tablet Take 1 tablet (10 mg total) by mouth daily.  90 tablet  3  . temazepam (RESTORIL) 15 MG capsule TAKE ONE CAPSULE BY MOUTH AT BEDTIME AS NEEDED FOR SLEEP  30 capsule  3  . triamcinolone cream (KENALOG) 0.1 % Apply topically 2 (two) times daily.  30 g  0   No current facility-administered medications for this encounter.    Physical Exam:  There were no vitals filed for this visit. I did not examine her. She had just been examined by Dr. Welton Flakes earlier today.  IMPRESSION: [name] is a 72 y.o. female status post MammoSite for an early stage right breast cancer with no evidence of disease  PLAN:  I will plan on seeing her back in a year. She seen Dr. Welton Flakes in the interim. She continues on antiestrogen therapy. She knows to contact us with any questions or concerns.    Lurline Hare, MD

## 2012-10-11 ENCOUNTER — Ambulatory Visit
Admission: RE | Admit: 2012-10-11 | Discharge: 2012-10-11 | Disposition: A | Discharge status: Home / Self Care | Payer: Medicare Other | Source: Ambulatory Visit | Attending: Oncology | Admitting: Oncology

## 2012-10-11 DIAGNOSIS — C50919 Malignant neoplasm of unspecified site of unspecified female breast: Secondary | ICD-10-CM

## 2012-10-12 ENCOUNTER — Other Ambulatory Visit: Payer: Self-pay | Admitting: Internal Medicine

## 2012-10-17 ENCOUNTER — Telehealth: Payer: Self-pay | Admitting: Medical Oncology

## 2012-10-17 NOTE — Telephone Encounter (Signed)
Per NP, informed patient the result of her bone density test and for pt to take calcium twice a day and Vit D3 1000 IU daily.  Patient states she already takes 2000 IU's daily, but will add the calcium twice daily. Patient with no further questions, knows to call office with questions and concerns.

## 2012-10-17 NOTE — Telephone Encounter (Signed)
Message copied by Rexene Edison on Mon Oct 17, 2012  4:27 PM ------      Message from: Augustin Schooling C      Created: Mon Oct 17, 2012  8:58 AM       Please call patient with results.  She needs to take calcium BID and Vitamin D3 1000 IU daily.  Thanks,             L      ----- Message -----         From: Rad Results In Interface         Sent: 10/11/2012   8:01 PM           To: Victorino December, MD                   ------

## 2012-10-24 NOTE — Progress Notes (Signed)
OFFICE PROGRESS NOTE  CC Dr. Sharlet Salina Hoxworth Dr. 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  PRIOR THERAPY:  #1 patient is status post lumpectomy for a stage I infiltrating ductal carcinoma that was ER positive.  #2 patient then underwent accelerated breast radiation with MammoSite completed on 05/05/2010.  #3 she was then started on Arimidex 1 mg daily. This was started on January 2012.  CURRENT THERAPY:Arimidex 1 mg   INTERVAL HISTORY: Overall, doing well, tolerating Arimidex with very few side effects. Mild arthralgias in the hands. No hot flashes no vaginal dryness.  No lower extremity edema. No headache or blurred vision. No cough or shortness of breath. No abdominal pain or new bone pain. Bowel and bladder function are normal. Appetite is good, with adequate fluid intake. Remainder of the 10 point review of systems is negative.  Last mammo was Oct. 2013   ALLERGIES:  is allergic to sulfonamide derivatives.   PHYSICAL EXAMINATION: General: Well developed, well nourished, in no acute distress.  EENT: No ocular or oral lesions. No stomatitis.  Respiratory: Lungs are clear to auscultation bilaterally with normal respiratory movement and no accessory muscle use. Cardiac: No murmur, rub or tachycardia. No upper or lower extremity edema.  GI: Abdomen is soft, no palpable hepatosplenomegaly. No fluid wave. No tenderness. Musculoskeletal: No kyphosis, no tenderness over the spine, ribs or hips. Lymph: No cervical, infraclavicular, axillary or inguinal adenopathy. Neuro: No focal neurological deficits. Psych: Alert and oriented X 3, appropriate mood and affect.  BREAST EXAM: In the supine position, with the right arm over the head, the right nipple is everted. No periareolar edema or nipple discharge. In the 12 o'clock position, just  superior to the areola, a 3 x 6 cm mass, reminiscent of seroma. This is close to the mammosite incision. Well healed remote incision from the 11-2 o'clock position. No redness of the skin. No right axillary adenopathy. With the left arm over the head, the left nipple is everted. No periareolar edema or nipple discharge. No mass in any quadrant or subareolar region. No redness of the skin. No left axillary adenopathy.    Bilateral breast examination is performed right breast reveals healed surgical scar there is no  Masses or nipple discharge or retraction or inversion. Left breast no masses or nipple discharge. ECOG PERFORMANCE STATUS: 1 - Symptomatic but completely ambulatory  Blood pressure 144/85, pulse 76, temperature 98.3 F (36.8 C), temperature source Oral, resp. rate 20, height 5\' 2"  (1.575 m), weight 194 lb 6.4 oz (88.179 kg).  LABORATORY DATA: Lab Results  Component Value Date   WBC 10.2 10/06/2012   HGB 13.7 10/06/2012   HCT 40.5 10/06/2012   MCV 93.2 10/06/2012   PLT 247 10/06/2012   ASSESSMENT:  1. Stage I infiltrating ductal carcinoma of the right breast originally diagnosed in November 2011. No evidence of recurrence. Patient will continue Arimidex 1 mg daily she is tolerating it well.he has no evidence of recurrent disease.   PLAN: #1 patient will continue Arimidex 1 mg daily. We will check her CBC and liver function studies on her next visit.   #2 patient will need a bone density scans since she is on Arimidex and she can develop osteopenia/osteoporosis.  #3 she will be seen back in 6 months in followup.  All questions were answered. The patient knows to call the clinic with any problems, questions  or concerns.The length of time of the face-to-face encounter was 30  minutes. More than 50% of time was spent counseling and coordination of care.      Drue Second, MD Medical/Oncology [facility] [phone] (beeper) [phone] (Office)

## 2012-11-28 ENCOUNTER — Telehealth: Payer: Self-pay | Admitting: Internal Medicine

## 2012-11-28 DIAGNOSIS — G2581 Restless legs syndrome: Secondary | ICD-10-CM

## 2012-11-28 MED ORDER — PRAMIPEXOLE DIHYDROCHLORIDE 1 MG PO TABS: 1.0000 mg | ORAL_TABLET | Freq: Two times a day (BID) | ORAL | Status: DC

## 2012-11-28 NOTE — Telephone Encounter (Signed)
Unable to contact pt- no way to contact- per dr Lovell Sheehan- m ay have 1 mg bid only-will send [facility]

## 2012-11-28 NOTE — Telephone Encounter (Signed)
Pt is currently in [place] and unable to get medication via mail order pharmacy.  She was instructed to have PCP write new script for pramipexole (MIRAPEX) 1 MG tablet to be sent to [facility], [address] [place], [place] [address] tel# [phone].  Patient states she was seen at ED out there and they gave her a temporary refill of this med.  The sig was written differently than what Dr. Lovell Sheehan originally wrote and patients states this seems to be working better.  Pt states she is cutting pills and taking them tid, 1 in the afternoon and 2 at night.

## 2013-01-19 ENCOUNTER — Ambulatory Visit (INDEPENDENT_AMBULATORY_CARE_PROVIDER_SITE_OTHER): Payer: Medicare Other | Admitting: Family Medicine

## 2013-01-19 ENCOUNTER — Encounter: Payer: Self-pay | Admitting: Family Medicine

## 2013-01-19 VITALS — BP 146/88 | Temp 98.1°F | Wt 196.0 lb

## 2013-01-19 DIAGNOSIS — M549 Dorsalgia, unspecified: Secondary | ICD-10-CM

## 2013-01-19 NOTE — Patient Instructions (Signed)
-  heat 15 minutes twice daily  -tylenol 500-1000mg  up to 3 times daily or ibuprofen 400mg  up to twice daily for pain if needed, topical sports creams with menthol or capsacin are also helpful  -please do exercises provided 4 times per week  -follow up if worsening or persists in 3 weeks

## 2013-01-19 NOTE — Progress Notes (Signed)
Chief Complaint  Patient presents with  . back soreness    HPI:  [name], a 72 yo pt of Dr. Lovell Sheehan, here for acute visit for back pain: -started about 1 week ago after pulling weeds -started in R shoulder but now in L shoulder -worse with raising arms and bend neck -denies: fevers, malaise, numbness or weakness  ROS: See pertinent positives and negatives per HPI.  Past Medical History  Diagnosis Date  . Diabetes mellitus   . Hypertension   . Arthritis   . Hearing loss   . Cancer     right breast   . History of radiation therapy 04/29/10-05/05/10    right breast 34Gy/69fx    Past Surgical History  Procedure Laterality Date  . Flex signoidoscopy    . Incision and drainage perirectal abscess    . Appendectomy    . Tubal ligation  1979  . Knee surgery  2006    right knee   . Breast surgery      rt breast lumpectomy    Family History  Problem Relation Age of Onset  . Cancer Sister     breast  . Heart disease Paternal Grandfather     History   Social History  . Marital Status: Married    Spouse Name: N/A    Number of Children: N/A  . Years of Education: N/A   Social History Main Topics  . Smoking status: Never Smoker   . Smokeless tobacco: None  . Alcohol Use: No  . Drug Use: No  . Sexual Activity: Yes   Other Topics Concern  . None   Social History Narrative  . None    Current outpatient prescriptions:amLODipine (NORVASC) 10 MG tablet, Take 1 tablet (10 mg total) by mouth daily., Disp: 90 tablet, Rfl: 3;  anastrozole (ARIMIDEX) 1 MG tablet, Take 1 tablet (1 mg total) by mouth daily., Disp: 30 tablet, Rfl: 8;  glucose blood (FREESTYLE LITE) test strip, Use as instructed, Disp: 100 each, Rfl: 3 HYDROcodone-acetaminophen (NORCO) 7.5-325 MG per tablet, TAKE ONE TABLET BY MOUTH EVERY 8 HOURS AS NEEDED FOR PAIN, Disp: 30 tablet, Rfl: 2;  lisinopril-hydrochlorothiazide (PRINZIDE,ZESTORETIC) 20-25 MG per tablet, Take 1 tablet by mouth daily., Disp: 90  tablet, Rfl: 3;  LORazepam (ATIVAN) 1 MG tablet, Take 1 tablet (1 mg total) by mouth every 8 (eight) hours as needed for anxiety., Disp: 90 tablet, Rfl: 3 metFORMIN (GLUCOPHAGE) 500 MG tablet, 3 tabs  Daily, Disp: 270 tablet, Rfl: 3;  potassium chloride (K-TABS) 10 MEQ tablet, Take 1 tablet (10 mEq total) by mouth 2 (two) times daily., Disp: 180 tablet, Rfl: 3;  pramipexole (MIRAPEX) 1 MG tablet, Take 1 tablet (1 mg total) by mouth 2 (two) times daily., Disp: 60 tablet, Rfl: 3;  solifenacin (VESICARE) 10 MG tablet, Take 1 tablet (10 mg total) by mouth daily., Disp: 90 tablet, Rfl: 3 temazepam (RESTORIL) 15 MG capsule, TAKE ONE CAPSULE BY MOUTH AT BEDTIME AS NEEDED FOR SLEEP, Disp: 30 capsule, Rfl: 3;  triamcinolone cream (KENALOG) 0.1 %, Apply topically 2 (two) times daily., Disp: 30 g, Rfl: 0  EXAM:  Filed Vitals:   01/19/13 1042  BP: 146/88  Temp: 98.1 F (36.7 C)    Body mass index is 35.84 kg/(m^2).  GENERAL: vitals reviewed and listed above, alert, oriented, appears well hydrated and in no acute distress  HEENT: atraumatic, conjunttiva clear, no obvious abnormalities on inspection of external nose and ears  NECK: no obvious masses on inspection  MS: moves all extremities without noticeable abnormality -can lift arms above head and normal ROM head and neck  -TTP in L trap muscle, no bony TTP, neg spurling, normal muscle strength in UE bilat, no winging scapula  PSYCH: pleasant and cooperative, no obvious depression or anxiety  ASSESSMENT AND PLAN:  Discussed the following assessment and plan:  Upper back pain  -discussed muscle strain likely and advised conservative care and back exercise -of course if worsenin gon not improving over next several weeks was advised to RTC -Patient advised to return or notify a doctor immediately if symptoms worsen or persist or new concerns arise.  Patient Instructions  -heat 15 minutes twice daily  -tylenol 500-1000mg  up to 3 times daily  or ibuprofen 400mg  up to twice daily for pain if needed, topical sports creams with menthol or capsacin are also helpful  -please do exercises provided 4 times per week  -follow up if worsening or persists in 3 weeks     KIM, HANNAH R.

## 2013-02-01 ENCOUNTER — Encounter: Payer: Self-pay | Admitting: Internal Medicine

## 2013-02-01 ENCOUNTER — Ambulatory Visit (INDEPENDENT_AMBULATORY_CARE_PROVIDER_SITE_OTHER): Payer: Medicare Other | Admitting: Internal Medicine

## 2013-02-01 VITALS — BP 140/80 | HR 72 | Temp 98.6°F | Resp 16 | Ht 62.0 in | Wt 188.0 lb

## 2013-02-01 DIAGNOSIS — I10 Essential (primary) hypertension: Secondary | ICD-10-CM

## 2013-02-01 DIAGNOSIS — Z23 Encounter for immunization: Secondary | ICD-10-CM

## 2013-02-01 DIAGNOSIS — E119 Type 2 diabetes mellitus without complications: Secondary | ICD-10-CM

## 2013-02-01 DIAGNOSIS — E1165 Type 2 diabetes mellitus with hyperglycemia: Secondary | ICD-10-CM

## 2013-02-01 DIAGNOSIS — IMO0002 Reserved for concepts with insufficient information to code with codable children: Secondary | ICD-10-CM

## 2013-02-01 LAB — HEMOGLOBIN A1C: Hgb A1c MFr Bld: 8.2 % — ABNORMAL HIGH (ref 4.6–6.5)

## 2013-02-01 LAB — BASIC METABOLIC PANEL
BUN: 13 mg/dL (ref 6–23)
Calcium: 9.1 mg/dL (ref 8.4–10.5)
Chloride: 97 mEq/L (ref 96–112)
Creatinine, Ser: 0.6 mg/dL (ref 0.4–1.2)
GFR: 96.45 mL/min (ref >60.00)

## 2013-02-01 MED ORDER — GLUCOSE BLOOD VI STRP: 1.0000 | ORAL_STRIP | Freq: Every day | Status: DC

## 2013-02-01 NOTE — Patient Instructions (Signed)
The patient is instructed to continue all medications as prescribed. Schedule followup with check out clerk upon leaving the clinic  

## 2013-02-01 NOTE — Progress Notes (Signed)
Subjective:    Patient ID: [name], female    DOB: 1940-11-02, 72 y.o.   MRN: [id number]  HPI Memory problems but has been under stress HTN follow up Breast cancer follow up due mamogram DM out of strips   Review of Systems  Constitutional: Positive for activity change and fatigue. Negative for appetite change.  HENT: Positive for congestion. Negative for ear pain, neck pain, postnasal drip and sinus pressure.   Eyes: Negative for redness and visual disturbance.  Respiratory: Negative for cough, shortness of breath and wheezing.   Cardiovascular: Positive for leg swelling.  Gastrointestinal: Negative for abdominal pain and abdominal distention.  Endocrine: Positive for polydipsia and polyphagia.  Genitourinary: Negative for dysuria, frequency and menstrual problem.  Musculoskeletal: Positive for joint swelling. Negative for myalgias and arthralgias.  Skin: Negative for rash and wound.  Neurological: Positive for tremors. Negative for dizziness, weakness and headaches.  Hematological: Negative for adenopathy. Does not bruise/bleed easily.  Psychiatric/Behavioral: Positive for behavioral problems and decreased concentration. Negative for sleep disturbance.   Past Medical History  Diagnosis Date  . Diabetes mellitus   . Hypertension   . Arthritis   . Hearing loss   . Cancer     right breast   . History of radiation therapy 04/29/10-05/05/10    right breast 34Gy/83fx    History   Social History  . Marital Status: Married    Spouse Name: N/A    Number of Children: N/A  . Years of Education: N/A   Occupational History  . Not on file.   Social History Main Topics  . Smoking status: Never Smoker   . Smokeless tobacco: Not on file  . Alcohol Use: No  . Drug Use: No  . Sexual Activity: Yes   Other Topics Concern  . Not on file   Social History Narrative  . No narrative on file    Past Surgical History  Procedure Laterality Date  . Flex signoidoscopy    .  Incision and drainage perirectal abscess    . Appendectomy    . Tubal ligation  1979  . Knee surgery  2006    right knee   . Breast surgery      rt breast lumpectomy    Family History  Problem Relation Age of Onset  . Cancer Sister     breast  . Heart disease Paternal Grandfather     Allergies  Allergen Reactions  . Sulfonamide Derivatives     Current Outpatient Prescriptions on File Prior to Visit  Medication Sig Dispense Refill  . amLODipine (NORVASC) 10 MG tablet Take 1 tablet (10 mg total) by mouth daily.  90 tablet  3  . anastrozole (ARIMIDEX) 1 MG tablet Take 1 tablet (1 mg total) by mouth daily.  30 tablet  8  . HYDROcodone-acetaminophen (NORCO) 7.5-325 MG per tablet TAKE ONE TABLET BY MOUTH EVERY 8 HOURS AS NEEDED FOR PAIN  30 tablet  2  . lisinopril-hydrochlorothiazide (PRINZIDE,ZESTORETIC) 20-25 MG per tablet Take 1 tablet by mouth daily.  90 tablet  3  . LORazepam (ATIVAN) 1 MG tablet Take 1 tablet (1 mg total) by mouth every 8 (eight) hours as needed for anxiety.  90 tablet  3  . metFORMIN (GLUCOPHAGE) 500 MG tablet 3 tabs  Daily  270 tablet  3  . pramipexole (MIRAPEX) 1 MG tablet Take 1 tablet (1 mg total) by mouth 2 (two) times daily.  60 tablet  3  . solifenacin (VESICARE) 10 MG  tablet Take 1 tablet (10 mg total) by mouth daily.  90 tablet  3  . temazepam (RESTORIL) 15 MG capsule TAKE ONE CAPSULE BY MOUTH AT BEDTIME AS NEEDED FOR SLEEP  30 capsule  3  . triamcinolone cream (KENALOG) 0.1 % Apply topically 2 (two) times daily.  30 g  0  . potassium chloride (K-TABS) 10 MEQ tablet Take 1 tablet (10 mEq total) by mouth 2 (two) times daily.  180 tablet  3   No current facility-administered medications on file prior to visit.    BP 140/80  Pulse 72  Temp(Src) 98.6 F (37 C)  Resp 16  Ht 5\' 2"  (1.575 m)  Wt 188 lb (85.276 kg)  BMI 34.38 kg/m2       Objective:   Physical Exam  Constitutional: She is oriented to person, place, and time. She appears  well-developed and well-nourished.  HENT:  Head: Normocephalic.  Eyes: Conjunctivae are normal. Pupils are equal, round, and reactive to light.  Neck: Normal range of motion. Neck supple.  Cardiovascular: Normal rate and regular rhythm.   Murmur heard. Pulmonary/Chest: Effort normal and breath sounds normal.  Abdominal: Soft. Bowel sounds are normal.  Musculoskeletal: She exhibits edema and tenderness.  Neurological: She is alert and oriented to person, place, and time.  Skin: Skin is warm and dry.  Psychiatric: She has a normal mood and affect. Her behavior is normal.          Assessment & Plan:  Patient has been out of strips and has not been monitoring her blood glucoses.  She is moderately controlled diabetes of adult onset type and needs to monitor at least twice daily due to fluctuations in blood glucoses. A prescription for her glucometer strips has been sent to her pharmacy and compliance has been urged Blood pressure is stable on current medication Appropriate blood work today will include a hemoglobin A1c a basic metabolic panel and an LDL C.

## 2013-03-06 ENCOUNTER — Ambulatory Visit (INDEPENDENT_AMBULATORY_CARE_PROVIDER_SITE_OTHER): Payer: Medicare Other | Admitting: Internal Medicine

## 2013-03-06 ENCOUNTER — Encounter: Payer: Self-pay | Admitting: Internal Medicine

## 2013-03-06 VITALS — BP 150/80 | HR 76 | Temp 97.5°F | Wt 189.0 lb

## 2013-03-06 DIAGNOSIS — L259 Unspecified contact dermatitis, unspecified cause: Secondary | ICD-10-CM

## 2013-03-06 DIAGNOSIS — L309 Dermatitis, unspecified: Secondary | ICD-10-CM

## 2013-03-06 DIAGNOSIS — IMO0002 Reserved for concepts with insufficient information to code with codable children: Secondary | ICD-10-CM

## 2013-03-06 DIAGNOSIS — R21 Rash and other nonspecific skin eruption: Secondary | ICD-10-CM

## 2013-03-06 DIAGNOSIS — E1165 Type 2 diabetes mellitus with hyperglycemia: Secondary | ICD-10-CM

## 2013-03-06 MED ORDER — PREDNISONE 20 MG PO TABS: ORAL_TABLET | ORAL | Status: DC

## 2013-03-06 NOTE — Progress Notes (Signed)
Chief Complaint  Patient presents with  . Rash    On her face and rt arm.  Says it itches and burns.    HPI: Patient comes in today for SDA for  new problem evaluation. Onset  Last week  Blotches on face   trainagular rash for a couple weeks and then worse 3 days.  NO hx of same  But had neck rash and trx ? Gold chain   No new  ttopicals  Now no new sun exposures.  ? New lotion biut not that different.  ? If sunscreen  Change    Hx of rash but never this bad.  ROS: See pertinent positives and negatives per HPI.no fever chills rash itches and burns  Has had som esens to some sunscreens on lips.  Past Medical History  Diagnosis Date  . Diabetes mellitus   . Hypertension   . Arthritis   . Hearing loss   . Cancer     right breast   . History of radiation therapy 04/29/10-05/05/10    right breast 34Gy/52fx    Family History  Problem Relation Age of Onset  . Cancer Sister     breast  . Heart disease Paternal Grandfather     History   Social History  . Marital Status: Married    Spouse Name: N/A    Number of Children: N/A  . Years of Education: N/A   Social History Main Topics  . Smoking status: Never Smoker   . Smokeless tobacco: None  . Alcohol Use: No  . Drug Use: No  . Sexual Activity: Yes   Other Topics Concern  . None   Social History Narrative  . None    Outpatient Encounter Prescriptions as of 03/06/2013  Medication Sig Dispense Refill  . amLODipine (NORVASC) 10 MG tablet Take 1 tablet (10 mg total) by mouth daily.  90 tablet  3  . anastrozole (ARIMIDEX) 1 MG tablet Take 1 tablet (1 mg total) by mouth daily.  30 tablet  8  . glucose blood (FREESTYLE LITE) test strip 1 each by Other route daily. Use as instructed  100 each  3  . HYDROcodone-acetaminophen (NORCO) 7.5-325 MG per tablet TAKE ONE TABLET BY MOUTH EVERY 8 HOURS AS NEEDED FOR PAIN  30 tablet  2  . lisinopril-hydrochlorothiazide (PRINZIDE,ZESTORETIC) 20-25 MG per tablet Take 1 tablet by mouth  daily.  90 tablet  3  . LORazepam (ATIVAN) 1 MG tablet Take 1 tablet (1 mg total) by mouth every 8 (eight) hours as needed for anxiety.  90 tablet  3  . metFORMIN (GLUCOPHAGE) 500 MG tablet 3 tabs  Daily  270 tablet  3  . pramipexole (MIRAPEX) 1 MG tablet Take 1 tablet (1 mg total) by mouth 2 (two) times daily.  60 tablet  3  . solifenacin (VESICARE) 10 MG tablet Take 1 tablet (10 mg total) by mouth daily.  90 tablet  3  . temazepam (RESTORIL) 15 MG capsule TAKE ONE CAPSULE BY MOUTH AT BEDTIME AS NEEDED FOR SLEEP  30 capsule  3  . triamcinolone cream (KENALOG) 0.1 % Apply topically 2 (two) times daily.  30 g  0  . predniSONE (DELTASONE) 20 MG tablet Take 3 po qd for 2 days then 2 po qd for 3 days,or as directed  12 tablet  0  . [DISCONTINUED] potassium chloride (K-TABS) 10 MEQ tablet Take 1 tablet (10 mEq total) by mouth 2 (two) times daily.  180 tablet  3  No facility-administered encounter medications on file as of 03/06/2013.    EXAM:  BP 150/80  Pulse 76  Temp(Src) 97.5 F (36.4 C) (Oral)  Wt 189 lb (85.73 kg)  BMI 34.56 kg/m2  SpO2 95%  Body mass index is 34.56 kg/(m^2).  GENERAL: vitals reviewed and listed above, alert, oriented, appears well hydrated and in no acute distress obv diffuses blotchy red rash on face   Upper chest and sun exposed arms  Excoriated no vesicle  Right arm fading ovoid patch  HEENT: atraumatic, conjunctiva  clear, no obvious abnormalities on inspection of external nose and ears OP : no lesion edema or exudate  Hard of hearing  NECK: no obvious masses on inspection palpation  LUNGS: clear to auscultation bilaterally, no wheezes, rales or rhonchi, good air movement CV: HRRR, no clubbing cyanosis or  peripheral edema nl cap refill  MS: moves all extremities without noticeable focal  abnormality PSYCH: pleasant and cooperative, no obvious depression or anxiety  ASSESSMENT AND PLAN:  Discussed the following assessment and plan:  Rash/skin eruption -  curious rash   suncertain cause pred benefit more than risk derm opino if not getting better or bx etc.   Type II or unspecified type diabetes mellitus with other specified manifestations, uncontrolled - disc risk of pred short term  Dermatitis  -Patient advised to return or notify health care team  if symptoms worsen or persist or new concerns arise.  Patient Instructions  This acts like an allergic reaction or sun reaction.    Uncertain cause  For now wear hat when outside and keep covered.  Can use dove or non allergic soaps  .  Can try eucerin type moisturizers ( no scent )   Prednisone course can elevate blood sugars temporarily.  Contact us if very high over 250 or so.   Use  Anti  Fungus   Topical on the right  Patch  That could have been ringworm.  Clotrimazole or miconazole  Use twice a day for 3 weeks to that area. oruntil resolved    Expect improvment in the next 3-6 days  . Contact us if not resolving  Consider dermatology to see you if so .   [name]. [name] M.D.

## 2013-03-06 NOTE — Patient Instructions (Addendum)
This acts like an allergic reaction or sun reaction.    Uncertain cause  For now wear hat when outside and keep covered.  Can use dove or non allergic soaps  .  Can try eucerin type moisturizers ( no scent )   Prednisone course can elevate blood sugars temporarily.  Contact us if very high over 250 or so.   Use  Anti  Fungus   Topical on the right  Patch  That could have been ringworm.  Clotrimazole or miconazole  Use twice a day for 3 weeks to that area. oruntil resolved    Expect improvment in the next 3-6 days  . Contact us if not resolving  Consider dermatology to see you if so .

## 2013-03-09 ENCOUNTER — Other Ambulatory Visit: Payer: Self-pay | Admitting: Internal Medicine

## 2013-03-09 DIAGNOSIS — Z9889 Other specified postprocedural states: Secondary | ICD-10-CM

## 2013-03-09 DIAGNOSIS — Z853 Personal history of malignant neoplasm of breast: Secondary | ICD-10-CM

## 2013-03-24 ENCOUNTER — Ambulatory Visit
Admission: RE | Admit: 2013-03-24 | Discharge: 2013-03-24 | Disposition: A | Discharge status: Home / Self Care | Payer: Medicare Other | Source: Ambulatory Visit | Attending: Internal Medicine | Admitting: Internal Medicine

## 2013-03-24 DIAGNOSIS — Z853 Personal history of malignant neoplasm of breast: Secondary | ICD-10-CM

## 2013-03-24 DIAGNOSIS — Z9889 Other specified postprocedural states: Secondary | ICD-10-CM

## 2013-04-20 ENCOUNTER — Ambulatory Visit (HOSPITAL_BASED_OUTPATIENT_CLINIC_OR_DEPARTMENT_OTHER): Payer: Medicare Other | Admitting: Oncology

## 2013-04-20 ENCOUNTER — Telehealth: Payer: Self-pay | Admitting: Oncology

## 2013-04-20 ENCOUNTER — Encounter: Payer: Self-pay | Admitting: Oncology

## 2013-04-20 ENCOUNTER — Other Ambulatory Visit (HOSPITAL_BASED_OUTPATIENT_CLINIC_OR_DEPARTMENT_OTHER): Payer: Medicare Other | Admitting: Lab

## 2013-04-20 VITALS — BP 159/77 | HR 80 | Temp 98.3°F | Resp 18 | Ht 62.0 in | Wt 193.0 lb

## 2013-04-20 DIAGNOSIS — C50919 Malignant neoplasm of unspecified site of unspecified female breast: Secondary | ICD-10-CM

## 2013-04-20 DIAGNOSIS — C50119 Malignant neoplasm of central portion of unspecified female breast: Secondary | ICD-10-CM

## 2013-04-20 DIAGNOSIS — Z170 Estrogen receptor positive status [ER+]: Secondary | ICD-10-CM

## 2013-04-20 LAB — CBC WITH DIFFERENTIAL/PLATELET
BASO%: 0.3 % (ref 0.0–2.0)
EOS%: 3 % (ref 0.0–7.0)
Eosinophils Absolute: 0.4 10*3/uL (ref 0.0–0.5)
HCT: 41.5 % (ref 34.8–46.6)
LYMPH%: 19.3 % (ref 14.0–49.7)
MCH: 31.7 pg (ref 25.1–34.0)
MCHC: 32.9 g/dL (ref 31.5–36.0)
MCV: 96.3 fL (ref 79.5–101.0)
MONO#: 1 10*3/uL — ABNORMAL HIGH (ref 0.1–0.9)
MONO%: 8.2 % (ref 0.0–14.0)
NEUT%: 69.2 % (ref 38.4–76.8)
Platelets: 252 10*3/uL (ref 145–400)
RDW: 14.1 % (ref 11.2–14.5)
WBC: 11.7 10*3/uL — ABNORMAL HIGH (ref 3.9–10.3)

## 2013-04-20 LAB — COMPREHENSIVE METABOLIC PANEL (CC13)
Anion Gap: 12 mEq/L — ABNORMAL HIGH (ref 3–11)
BUN: 13.1 mg/dL (ref 7.0–26.0)
CO2: 29 mEq/L (ref 22–29)
Calcium: 9.8 mg/dL (ref 8.4–10.4)
Chloride: 98 mEq/L (ref 98–109)
Creatinine: 0.8 mg/dL (ref 0.6–1.1)
Sodium: 139 mEq/L (ref 136–145)
Total Protein: 7.4 g/dL (ref 6.4–8.3)

## 2013-04-20 NOTE — Progress Notes (Signed)
OFFICE PROGRESS NOTE  CC Dr. Sharlet Salina Hoxworth Dr. 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  PRIOR THERAPY:  #1 patient is status post lumpectomy for a stage I infiltrating ductal carcinoma that was ER positive.  #2 patient then underwent accelerated breast radiation with MammoSite completed on 05/05/2010.  #3 she was then started on Arimidex 1 mg daily. This was started on January 2012.  CURRENT THERAPY:Arimidex 1 mg   INTERVAL HISTORY: Patient is seen in followup today in 6 months. Her last visit with me was back in June 2014. Clinically she denies any fevers chills night sweats headaches shortness of breath chest pains palpitations. She is tolerating Arimidex well no aches or pains no nausea or vomiting no peripheral paresthesias. Patient is concerned about her nails they are chipping. I did recommend to her nail complex vitamins she will get this from a local pharmacy. Remainder of the 10 point review of systems is negative.   ALLERGIES:  is allergic to sulfonamide derivatives.   PHYSICAL EXAMINATION: General: Well developed, well nourished, in no acute distress.  EENT: No ocular or oral lesions. No stomatitis.  Respiratory: Lungs are clear to auscultation bilaterally with normal respiratory movement and no accessory muscle use. Cardiac: No murmur, rub or tachycardia. No upper or lower extremity edema.  GI: Abdomen is soft, no palpable hepatosplenomegaly. No fluid wave. No tenderness. Musculoskeletal: No kyphosis, no tenderness over the spine, ribs or hips. Lymph: No cervical, infraclavicular, axillary or inguinal adenopathy. Neuro: No focal neurological deficits. Psych: Alert and oriented X 3, appropriate mood and affect.  BREAST EXAM: In the supine position, with the right arm over the head, the right nipple is everted. No  periareolar edema or nipple discharge. In the 12 o'clock position, just superior to the areola, a 3 x 6 cm mass, reminiscent of seroma. This is close to the mammosite incision. Well healed remote incision from the 11-2 o'clock position. No redness of the skin. No right axillary adenopathy. With the left arm over the head, the left nipple is everted. No periareolar edema or nipple discharge. No mass in any quadrant or subareolar region. No redness of the skin. No left axillary adenopathy.    Bilateral breast examination is performed right breast reveals healed surgical scar there is no  Masses or nipple discharge or retraction or inversion. Left breast no masses or nipple discharge. ECOG PERFORMANCE STATUS: 1 - Symptomatic but completely ambulatory  Blood pressure 159/77, pulse 80, temperature 98.3 F (36.8 C), temperature source Oral, resp. rate 18, height 5\' 2"  (1.575 m), weight 193 lb (87.544 kg).  LABORATORY DATA: Lab Results  Component Value Date   WBC 11.7* 04/20/2013   HGB 13.7 04/20/2013   HCT 41.5 04/20/2013   MCV 96.3 04/20/2013   PLT 252 04/20/2013   ASSESSMENT:  1. Stage I infiltrating ductal carcinoma of the right breast originally diagnosed in November 2011. No evidence of recurrence. Patient will continue Arimidex 1 mg daily she is tolerating it well.he has no evidence of recurrent disease.  #2 patient had bone density scan performed shows normal scan.  #3 mammogram was stable.  PLAN: #1 patient will continue Arimidex 1 mg daily.   #2 patient will continue getting annual mammograms.  #3 she will be seen back in 6 months in followup.  All questions were answered. The patient knows to call the clinic with any problems,  questions or concerns.The length of time of the face-to-face encounter was 30  minutes. More than 50% of time was spent counseling and coordination of care.      Drue Second, MD Medical/Oncology [facility] [phone] (beeper) [phone]  (Office)

## 2013-04-20 NOTE — Telephone Encounter (Signed)
, °

## 2013-04-21 LAB — VITAMIN D 25 HYDROXY (VIT D DEFICIENCY, FRACTURES): Vit D, 25-Hydroxy: 88 ng/mL (ref 30–89)

## 2013-06-05 ENCOUNTER — Other Ambulatory Visit: Payer: Self-pay | Admitting: *Deleted

## 2013-06-05 ENCOUNTER — Telehealth: Payer: Self-pay | Admitting: Internal Medicine

## 2013-06-05 MED ORDER — AMLODIPINE BESYLATE 10 MG PO TABS: 10.0000 mg | ORAL_TABLET | Freq: Every day | ORAL | Status: DC

## 2013-06-05 NOTE — Telephone Encounter (Signed)
[facility] [place] requesting new script for amLODipine (NORVASC) 10 MG tablet.  States pt has been getting this medication filled with mail order pharmacy, however she is now requesting to have filled at local pharmacy.

## 2013-06-05 NOTE — Telephone Encounter (Signed)
Done

## 2013-06-09 ENCOUNTER — Telehealth: Payer: Self-pay | Admitting: Internal Medicine

## 2013-06-09 NOTE — Telephone Encounter (Signed)
Called and spoke with pt and pt is aware of Dr. Julianne Rice recommendations. Per pt she is not taking lorazepam or temazepam  any longer and takes the Norco occasionally. Pt would still like to schedule new patient appointment.

## 2013-06-09 NOTE — Telephone Encounter (Signed)
That is fine, please change the md banner

## 2013-06-09 NOTE — Telephone Encounter (Signed)
Pt is requesting to switch to dr.Kim due to availability with dr.jenkins

## 2013-06-09 NOTE — Telephone Encounter (Signed)
Advise she schedule new patient visit in the designated spots (mondays at 11:15). In the meantime, until pt has seen me for the new patient visit I would request and advise that Dr. Arnoldo Morale remain the PCP. Please make the patient aware that I do not prescribe temazepam, lorazepam or norco routinely for adult patients and may ask her to see a specialist for management of these if she is still taking them.

## 2013-06-13 ENCOUNTER — Telehealth: Payer: Self-pay | Admitting: Internal Medicine

## 2013-06-13 NOTE — Telephone Encounter (Signed)
Called pt lmom to call and schedule appt

## 2013-06-13 NOTE — Telephone Encounter (Signed)
error 

## 2013-06-15 NOTE — Telephone Encounter (Signed)
Pt will come in 06/16/13 at 9:30pm

## 2013-07-05 ENCOUNTER — Ambulatory Visit: Payer: Medicare Other | Admitting: Internal Medicine

## 2013-07-12 ENCOUNTER — Telehealth: Payer: Self-pay | Admitting: Internal Medicine

## 2013-07-12 ENCOUNTER — Other Ambulatory Visit: Payer: Self-pay | Admitting: *Deleted

## 2013-07-12 MED ORDER — LISINOPRIL-HYDROCHLOROTHIAZIDE 20-25 MG PO TABS: 1.0000 | ORAL_TABLET | Freq: Every day | ORAL | Status: DC

## 2013-07-12 MED ORDER — SOLIFENACIN SUCCINATE 10 MG PO TABS: 10.0000 mg | ORAL_TABLET | Freq: Every day | ORAL | Status: DC

## 2013-07-12 MED ORDER — METFORMIN HCL 500 MG PO TABS: ORAL_TABLET | ORAL | Status: DC

## 2013-07-12 NOTE — Telephone Encounter (Signed)
done

## 2013-07-12 NOTE — Telephone Encounter (Signed)
[facility] - [place], [place] - [address] requesting new scripts for the following:  metFORMIN (GLUCOPHAGE) 500 MG tablet solifenacin (VESICARE) 10 MG tablet lisinopril-hydrochlorothiazide (PRINZIDE,ZESTORETIC) 20-25 MG per tablet

## 2013-07-19 ENCOUNTER — Telehealth: Payer: Self-pay | Admitting: Internal Medicine

## 2013-07-19 NOTE — Telephone Encounter (Signed)
Patient went to pick up solifenacin (VESICARE) 10 MG tablet from pharmacy and was told that it is now $475.00 and she can not afford it, the pharmacist recommended Oxydutynun and it was free or the Tolterodine and it costs $125.00. She needs to know which one that she'll be taking and she would like for it to be sent to wherever her insurance [employer] allows it to be. Please call her back with what the decision is.

## 2013-07-20 MED ORDER — TOLTERODINE TARTRATE ER 4 MG PO CP24: 4.0000 mg | ORAL_CAPSULE | Freq: Every day | ORAL | Status: DC

## 2013-07-20 NOTE — Telephone Encounter (Signed)
Pt aware.

## 2013-07-20 NOTE — Telephone Encounter (Signed)
detrol 4 mg per Dr Arnoldo Morale

## 2013-07-20 NOTE — Telephone Encounter (Signed)
rx sent in electronically, Left message for pt to call back

## 2013-09-09 ENCOUNTER — Other Ambulatory Visit: Payer: Self-pay | Admitting: Internal Medicine

## 2013-09-09 ENCOUNTER — Other Ambulatory Visit: Payer: Self-pay | Admitting: Oncology

## 2013-09-11 ENCOUNTER — Encounter: Payer: Self-pay | Admitting: Family Medicine

## 2013-09-11 ENCOUNTER — Ambulatory Visit (INDEPENDENT_AMBULATORY_CARE_PROVIDER_SITE_OTHER): Payer: Commercial Managed Care - HMO | Admitting: Family Medicine

## 2013-09-11 VITALS — BP 164/80 | HR 97 | Temp 98.3°F | Ht 62.0 in | Wt 206.0 lb

## 2013-09-11 DIAGNOSIS — Z789 Other specified health status: Secondary | ICD-10-CM

## 2013-09-11 DIAGNOSIS — I10 Essential (primary) hypertension: Secondary | ICD-10-CM

## 2013-09-11 DIAGNOSIS — M1990 Unspecified osteoarthritis, unspecified site: Secondary | ICD-10-CM

## 2013-09-11 DIAGNOSIS — E663 Overweight: Secondary | ICD-10-CM

## 2013-09-11 DIAGNOSIS — C50919 Malignant neoplasm of unspecified site of unspecified female breast: Secondary | ICD-10-CM

## 2013-09-11 DIAGNOSIS — N318 Other neuromuscular dysfunction of bladder: Secondary | ICD-10-CM

## 2013-09-11 DIAGNOSIS — G2581 Restless legs syndrome: Secondary | ICD-10-CM

## 2013-09-11 DIAGNOSIS — E119 Type 2 diabetes mellitus without complications: Secondary | ICD-10-CM

## 2013-09-11 DIAGNOSIS — N3281 Overactive bladder: Secondary | ICD-10-CM

## 2013-09-11 HISTORY — DX: Other specified health status: Z78.9

## 2013-09-11 MED ORDER — OXYBUTYNIN CHLORIDE 5 MG PO TABS: 5.0000 mg | ORAL_TABLET | Freq: Two times a day (BID) | ORAL | Status: DC

## 2013-09-11 MED ORDER — METFORMIN HCL 1000 MG PO TABS: ORAL_TABLET | ORAL | Status: DC

## 2013-09-11 NOTE — Progress Notes (Signed)
No chief complaint on file.   HPI:  [name] is here to establish care.  Last PCP and physical:   Urinary Frequency and urgency: -has had this chornically -vesicare suddenly not covered, tried tolterodine per pcp and did not work -wants to try oxybutynin -if this does not work will have her see urologist  HTN: -on norvasc, lisinopril-hctz  Hx of Breast Ca: -followed by Dr. Humphrey Rolls, on arimidex  OA of the knees: -followed by Dr. Maureen Ralphs, had steroid inj, considering synvisc  DM: -reports on metformin -she reports needs to work on diet, des not check home BS  RLS: -on mirapex  Has the following chronic problems and concerns today:  Patient Active Problem List   Diagnosis Date Noted  . No blood products (Jehovah Witness)  09/11/2013  . OSTEOPENIA 06/10/2010  . BREAST CANCER Right T1b N0 S/P lumpectomy 04/2010, Partial breast radiation, Arimadex 04/04/2010  . BACK PAIN, CHRONIC 08/30/2009  . SHOULDER PAIN, RIGHT 07/23/2009  . OVERWEIGHT 10/18/2008  . URINARY FREQUENCY, CHRONIC 07/18/2008  . RESTLESS LEGS SYNDROME 05/05/2007  . OBSTRUCTIVE SLEEP APNEA 03/01/2007  . DIABETES MELLITUS, TYPE II 01/27/2007  . HYPERTENSION 01/27/2007  . OSTEOARTHRITIS 01/27/2007    Health Maintenance:  ROS: See pertinent positives and negatives per HPI.  Past Medical History  Diagnosis Date  . Diabetes mellitus   . Hypertension   . Arthritis   . Hearing loss   . Cancer     right breast   . History of radiation therapy 04/29/10-05/05/10    right breast 34Gy/101fx  . Actinic keratosis 07/18/2008    Qualifier: Diagnosis of  By: Arnoldo Morale MD, Balinda Quails   . Closed fracture of one or more phalanges of foot 03/29/2008    Qualifier: Diagnosis of  By: Arnoldo Morale MD, Balinda Quails   . No blood products (Jehovah Witness)  09/11/2013  . RESTLESS LEGS SYNDROME 05/05/2007    Qualifier: Diagnosis of  By: Gwenette Greet MD, Armando Reichert     Family History  Problem Relation Age of Onset  . Cancer Sister     breast   . Heart disease Paternal Grandfather     History   Social History  . Marital Status: Married    Spouse Name: N/A    Number of Children: N/A  . Years of Education: N/A   Social History Main Topics  . Smoking status: Never Smoker   . Smokeless tobacco: None  . Alcohol Use: No  . Drug Use: No  . Sexual Activity: Yes   Other Topics Concern  . None   Social History Narrative   Work or School: at home      Home Situation: lives with husband      Spiritual Beliefs: Jehovah Witness - no blood products      Lifestyle: no regular exercise; diet poor             Current outpatient prescriptions:amLODipine (NORVASC) 10 MG tablet, Take 1 tablet (10 mg total) by mouth daily., Disp: 90 tablet, Rfl: 3;  anastrozole (ARIMIDEX) 1 MG tablet, TAKE ONE TABLET BY MOUTH ONCE DAILY, Disp: 30 tablet, Rfl: 0;  glucose blood (FREESTYLE LITE) test strip, 1 each by Other route daily. Use as instructed, Disp: 100 each, Rfl: 3 lisinopril-hydrochlorothiazide (PRINZIDE,ZESTORETIC) 20-25 MG per tablet, Take 1 tablet by mouth daily., Disp: 90 tablet, Rfl: 3;  metFORMIN (GLUCOPHAGE) 1000 MG tablet, 1000mg  (1 tablet) twice daily, Disp: 180 tablet, Rfl: 3;  pramipexole (MIRAPEX) 1 MG tablet, TAKE ONE TABLET BY  MOUTH TWICE DAILY, Disp: 60 tablet, Rfl: 0;  triamcinolone cream (KENALOG) 0.1 %, Apply topically 2 (two) times daily., Disp: 30 g, Rfl: 0 oxybutynin (DITROPAN) 5 MG tablet, Take 1 tablet (5 mg total) by mouth 2 (two) times daily., Disp: 60 tablet, Rfl: 3  EXAM:  Filed Vitals:   09/11/13 1120  BP: 164/80  Pulse: 97  Temp: 98.3 F (36.8 C)    Body mass index is 37.67 kg/(m^2).  GENERAL: vitals reviewed and listed above, alert, oriented, appears well hydrated and in no acute distress  HEENT: atraumatic, conjunttiva clear, no obvious abnormalities on inspection of external nose and ears  NECK: no obvious masses on inspection  LUNGS: clear to auscultation bilaterally, no wheezes, rales or  rhonchi, good air movement  CV: HRRR, no peripheral edema  MS: moves all extremities without noticeable abnormality  PSYCH: pleasant and cooperative, no obvious depression or anxiety  ASSESSMENT AND PLAN:  Discussed the following assessment and plan:  Overactive bladder - Plan: oxybutynin (DITROPAN) 5 MG tablet  No blood products (Jehovah Witness)   RESTLESS LEGS SYNDROME  DIABETES MELLITUS, TYPE II - Plan: metFORMIN (GLUCOPHAGE) 1000 MG tablet  HYPERTENSION  OSTEOARTHRITIS  Overweight  BREAST CANCER Right T1b N0 S/P lumpectomy 04/2010, Partial breast radiation, Arimadex  -We reviewed the PMH, PSH, FH, SH, Meds and Allergies. -We provided refills for any medications we will prescribe as needed. -We addressed current concerns per orders and patient instructions. -We have asked for records for pertinent exams, studies, vaccines and notes from previous providers. -We have advised patient to follow up per instructions below. -she wants to hold of on labs and do at physical -trial of oxybutynin, risks discussed -follow up physical and labs   -Patient advised to return or notify a doctor immediately if symptoms worsen or persist or new concerns arise.  Patient Instructions  -increase metformin to 1000mg  twice daily  -check blood sugar at least fasting a few times per week and keep log  -stop the detrol and try the oxybutynin for the urinary symptoms   We recommend the following healthy lifestyle measures: - eat a healthy LOW CARB diet consisting of lots of vegetables, fruits, beans, nuts, seeds, healthy meats such as white chicken and fish and whole grains.  - avoid fried foods, fast food, processed foods, sodas, red meet and other fattening foods.  - get a least 150 minutes of aerobic exercise per week.   -follow up for physical and labs - in the next few months (schedule morning appointment, drink plenty of water but do not eat that morning and we will check labs at  the appointment)      [name]

## 2013-09-11 NOTE — Progress Notes (Signed)
Pre visit review using our clinic review tool, if applicable. No additional management support is needed unless otherwise documented below in the visit note. 

## 2013-09-11 NOTE — Patient Instructions (Addendum)
-  increase metformin to 1000mg  twice daily  -check blood sugar at least fasting a few times per week and keep log  -stop the detrol and try the oxybutynin for the urinary symptoms   We recommend the following healthy lifestyle measures: - eat a healthy LOW CARB diet consisting of lots of vegetables, fruits, beans, nuts, seeds, healthy meats such as white chicken and fish and whole grains.  - avoid fried foods, fast food, processed foods, sodas, red meet and other fattening foods.  - get a least 150 minutes of aerobic exercise per week.   -follow up for physical and labs - in the next few months (schedule morning appointment, drink plenty of water but do not eat that morning and we will check labs at the appointment)

## 2013-09-12 ENCOUNTER — Telehealth: Payer: Self-pay | Admitting: Internal Medicine

## 2013-09-12 NOTE — Telephone Encounter (Signed)
Relevant patient education mailed to patient.  

## 2013-09-15 ENCOUNTER — Telehealth: Payer: Self-pay

## 2013-09-15 NOTE — Telephone Encounter (Signed)
Relevant patient education assigned to patient using Emmi. ° °

## 2013-09-21 ENCOUNTER — Ambulatory Visit
Admission: RE | Admit: 2013-09-21 | Discharge: 2013-09-21 | Disposition: A | Discharge status: Home / Self Care | Payer: Medicare Other | Source: Ambulatory Visit | Attending: Radiation Oncology | Admitting: Radiation Oncology

## 2013-09-21 VITALS — BP 157/62 | HR 79 | Temp 98.5°F | Resp 20 | Ht 62.0 in | Wt 206.6 lb

## 2013-09-21 DIAGNOSIS — C50919 Malignant neoplasm of unspecified site of unspecified female breast: Secondary | ICD-10-CM

## 2013-09-21 NOTE — Progress Notes (Signed)
Department of Radiation Oncology  Phone:  [phone] Fax:        [phone]   Name: [name] MRN: [id number]  DOB: [birth year]  Date: 09/21/2013  Follow Up Visit Note  Diagnosis: T1N0 right breast cancer  Summary and Interval since last radiation: almost 4 years (mammosite finished 05/05/10)  Interval History: [name] presents today for routine followup.  She is doing well from her breast standpoint. She is on Arimidex. She had mammograms in November which were negative. She has had fluid expressed at both mammograms which has decreased the size of the palpable mass in her breast. She needs a refill on her Arimidex.She has no breast related complaints other than the palpable mass which is decreasing in size.   Allergies:  Allergies  Allergen Reactions  . Sulfonamide Derivatives     Medications:  Current Outpatient Prescriptions  Medication Sig Dispense Refill  . amLODipine (NORVASC) 10 MG tablet Take 1 tablet (10 mg total) by mouth daily.  90 tablet  3  . anastrozole (ARIMIDEX) 1 MG tablet TAKE ONE TABLET BY MOUTH ONCE DAILY  30 tablet  0  . glucose blood (FREESTYLE LITE) test strip 1 each by Other route daily. Use as instructed  100 each  3  . lisinopril-hydrochlorothiazide (PRINZIDE,ZESTORETIC) 20-25 MG per tablet Take 1 tablet by mouth daily.  90 tablet  3  . metFORMIN (GLUCOPHAGE) 1000 MG tablet 1000mg  (1 tablet) twice daily  180 tablet  3  . oxybutynin (DITROPAN) 5 MG tablet Take 1 tablet (5 mg total) by mouth 2 (two) times daily.  60 tablet  3  . pramipexole (MIRAPEX) 1 MG tablet TAKE ONE TABLET BY MOUTH TWICE DAILY  60 tablet  0  . triamcinolone cream (KENALOG) 0.1 % Apply topically 2 (two) times daily.  30 g  0   No current facility-administered medications for this encounter.    Physical Exam:  Filed Vitals:   09/21/13 1317  BP: 157/62  Pulse: 79  Temp: 98.5 F (36.9 C)  Resp: 20  Hard mass in the upper to upper outer quadrant of the right breast with a  well healed surgical scar. No palpable abnormalities of the left breast. No palpable axillary or supraclavicular adenopathy bilaterally.   IMPRESSION: [name] is a 73 y.o. female status post MammoSite for an early stage right breast cancer with no evidence of disease  PLAN:  I will plan on seeing her back in a year. She will see Dr. Humphrey Rolls in June. She will contact her pharmacy for a refill request. She will get a mammogram in November.     Thea Silversmith, MD

## 2013-09-21 NOTE — Progress Notes (Signed)
Follow up rad tx right breast mammosite 04/29/10-05/05/10,  Last mammogram done 03/24/13, no c/o pain in breast, doesn't have her hearing aids in, needs refill on Arimidex stated, needs left knee replaced , pain in that knee but will wait till fall, husband hx seizures and unable to drive for 6 months,  Appetite good,  1:25 PM

## 2013-09-29 ENCOUNTER — Encounter: Payer: Commercial Managed Care - HMO | Admitting: Family Medicine

## 2013-10-11 ENCOUNTER — Ambulatory Visit (INDEPENDENT_AMBULATORY_CARE_PROVIDER_SITE_OTHER): Payer: Commercial Managed Care - HMO | Admitting: Family Medicine

## 2013-10-11 ENCOUNTER — Encounter: Payer: Self-pay | Admitting: Family Medicine

## 2013-10-11 VITALS — BP 122/80 | HR 86 | Temp 98.5°F | Ht 61.25 in | Wt 202.5 lb

## 2013-10-11 DIAGNOSIS — N3281 Overactive bladder: Secondary | ICD-10-CM

## 2013-10-11 DIAGNOSIS — E119 Type 2 diabetes mellitus without complications: Secondary | ICD-10-CM

## 2013-10-11 DIAGNOSIS — E785 Hyperlipidemia, unspecified: Secondary | ICD-10-CM

## 2013-10-11 DIAGNOSIS — N318 Other neuromuscular dysfunction of bladder: Secondary | ICD-10-CM

## 2013-10-11 DIAGNOSIS — H9190 Unspecified hearing loss, unspecified ear: Secondary | ICD-10-CM

## 2013-10-11 DIAGNOSIS — I10 Essential (primary) hypertension: Secondary | ICD-10-CM

## 2013-10-11 DIAGNOSIS — Z23 Encounter for immunization: Secondary | ICD-10-CM

## 2013-10-11 LAB — HEMOGLOBIN A1C: Hgb A1c MFr Bld: 8.1 % — ABNORMAL HIGH (ref 4.6–6.5)

## 2013-10-11 LAB — MICROALBUMIN / CREATININE URINE RATIO
Creatinine,U: 91.7 mg/dL
MICROALB/CREAT RATIO: 7 mg/g (ref 0.0–30.0)
Microalb, Ur: 6.4 mg/dL — ABNORMAL HIGH (ref 0.0–1.9)

## 2013-10-11 LAB — LIPID PANEL
Cholesterol: 145 mg/dL (ref 0–200)
HDL: 40.1 mg/dL (ref >39.00)
LDL Cholesterol: 93 mg/dL (ref 0–99)
TRIGLYCERIDES: 61 mg/dL (ref 0.0–149.0)
Total CHOL/HDL Ratio: 4
VLDL: 12.2 mg/dL (ref 0.0–40.0)

## 2013-10-11 LAB — BASIC METABOLIC PANEL
BUN: 14 mg/dL (ref 6–23)
CALCIUM: 9.1 mg/dL (ref 8.4–10.5)
CO2: 29 meq/L (ref 19–32)
Chloride: 94 mEq/L — ABNORMAL LOW (ref 96–112)
Creatinine, Ser: 0.6 mg/dL (ref 0.4–1.2)
GFR: 101.75 mL/min (ref >60.00)
GLUCOSE: 130 mg/dL — AB (ref 70–99)
Potassium: 2.9 mEq/L — ABNORMAL LOW (ref 3.5–5.1)
Sodium: 134 mEq/L — ABNORMAL LOW (ref 135–145)

## 2013-10-11 MED ORDER — OXYBUTYNIN CHLORIDE 5 MG PO TABS: 5.0000 mg | ORAL_TABLET | Freq: Two times a day (BID) | ORAL | Status: DC

## 2013-10-11 MED ORDER — GLUCOSE BLOOD VI STRP: ORAL_STRIP | Status: DC

## 2013-10-11 NOTE — Addendum Note (Signed)
Addended by: Agnes Lawrence on: 10/11/2013 09:48 AM   Modules accepted: Orders

## 2013-10-11 NOTE — Patient Instructions (Signed)
-  we placed a referral to the diabetes educator  -We have ordered labs or studies at this visit. It can take up to 1-2 weeks for results and processing. We will contact you with instructions IF your results are abnormal. Normal results will be released to your [facility]. If you have not heard from [place] or can not find your results in [facility] in 2 weeks please contact our office.  The following written screening schedule of preventive measures were reviewed::           Alcohol screening: done     Obesity Screening and counseling: done, diet and exercise advise     Tobacco Screening: done       Pneumococcal Vaccine ASSESSMENT/PLAN: wants to get this today      Screening mammograph (yearly if >40) ASSESSMENT/PLAN: done      Colorectal cancer screening (FOBT yearly or flex sig q4y or colonoscopy q10y or barium enema q4y) ASSESSMENT/PLAN:  Wants to do FOBT/stool cards given      Diabetes outpatient self-management training services ASSESSMENT/PLAN:done, referral placed      Bone mass measurements(covered q2y if indicated - estrogen def, osteoporosis, hyperparathyroid, vertebral abnormalities, osteoporosis or steroids) ASSESSMENT/PLAN: done      Screening for glaucoma(q1y if high risk - diabetes, FH, AA and > 50 or hispanic and > 65) ASSESSMENT/PLAN: sees optho      Medical nutritional therapy for individuals with diabetes or renal disease ASSESSMENT/PLAN: done, referred to nutrition      Cardiovascular screening blood tests (lipids q5y) ASSESSMENT/PLAN:doing today      Diabetes screening tests ASSESSMENT/PLAN:doing today

## 2013-10-11 NOTE — Progress Notes (Signed)
Medicare Annual Preventive Care Visit  (initial annual wellness or annual wellness exam)  Urinary Frequency and urgency:  -doing well  HTN:  -on norvasc, lisinopril-hctz  Hx of Breast Ca:  -followed by Dr. Humphrey Rolls, on arimidex   OA of the knees:  -followed by Dr. 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  NO  History of falling?  NO  Any trouble with IADLs - using a phone, using transportation, grocery shopping, preparing meals, doing housework, doing laundry, taking medications and managing money?  NO  Advance Directives? YES   See summary of recommendations in Patient Instructions below.  4.) Physical Exam Filed Vitals:   10/11/13 0837  BP: 122/80  Pulse: 86  Temp: 98.5 F (36.9 C)   Estimated body mass index is 37.94 kg/(m^2) as calculated from the following:   Height as of this encounter: 5' 1.25" (1.556 m).   Weight as of this encounter: 202 lb 8 oz (91.853 kg).  Mini Cog: 1. Patient instructed to listen carefully and repeat the following: [place]  2. Clock drawing test was administered: NORMAL     ABNORMAL  3. Recall of three words  Scoring:  Patient Score: NEG  POS  See patient instructions for recommendations.  4)The following written screening schedule of preventive measures were reviewed with assessment and plan made per below, orders and patient instructions:      AAA  screening:N/A     Alcohol screening: done     Obesity Screening and counseling: done, diet and exercise advise     STI screening: declined     Tobacco Screening: done       Pneumococcal (PPSV23 -one dose after 64, one before if risk factors), influenza yearly and hepatitis B vaccines (if high risk - end stage renal disease, IV drugs, homosexual men, live in home for mentally retarded, hemophilia receiving factors) ASSESSMENT/PLAN: wants to get this today      Screening mammograph (yearly if >40) ASSESSMENT/PLAN: done      Screening Pap smear/pelvic exam (q2 years) ASSESSMENT/PLAN: N/A      Colorectal cancer screening (FOBT yearly or flex sig q4y or colonoscopy q10y or barium enema q4y) ASSESSMENT/PLAN:  Wants to do FOBT      Diabetes outpatient self-management training services ASSESSMENT/PLAN:done      Bone mass measurements(covered q2y if indicated - estrogen def, osteoporosis, hyperparathyroid, vertebral abnormalities, osteoporosis or steroids) ASSESSMENT/PLAN: done      Screening for glaucoma(q1y if high risk - diabetes, FH, AA and > 50 or hispanic and > 65) ASSESSMENT/PLAN: sees optho      Medical nutritional therapy for individuals with diabetes or renal disease ASSESSMENT/PLAN: done, referred to nutrition      Cardiovascular screening blood tests (lipids q5y) ASSESSMENT/PLAN:doing today      Diabetes screening tests ASSESSMENT/PLAN:doing today   7.) Summary: -risk factors and conditions per above assessment were discussed  and treatment, recommendations and referrals were offered per documentation above and orders and patient instructions.  Hearing loss - has hearing aides, follow by audiologist  DIABETES MELLITUS, TYPE II - Plan: Ambulatory referral to diabetic education, Microalbumin/Creatinine Ratio, Urine, Hemoglobin A1c  HYPERTENSION - Plan: Basic metabolic panel  Other and unspecified hyperlipidemia - Plan: Lipid panel  Overactive bladder - Plan:  oxybutynin (DITROPAN) 5 MG tablet

## 2013-10-11 NOTE — Progress Notes (Signed)
Pre visit review using our clinic review tool, if applicable. No additional management support is needed unless otherwise documented below in the visit note. 

## 2013-10-12 ENCOUNTER — Telehealth: Payer: Self-pay

## 2013-10-12 ENCOUNTER — Other Ambulatory Visit: Payer: Self-pay | Admitting: Adult Health

## 2013-10-12 ENCOUNTER — Telehealth: Payer: Self-pay | Admitting: Internal Medicine

## 2013-10-12 ENCOUNTER — Telehealth: Payer: Self-pay | Admitting: Hematology and Oncology

## 2013-10-12 ENCOUNTER — Other Ambulatory Visit (INDEPENDENT_AMBULATORY_CARE_PROVIDER_SITE_OTHER): Payer: Commercial Managed Care - HMO

## 2013-10-12 DIAGNOSIS — I10 Essential (primary) hypertension: Secondary | ICD-10-CM

## 2013-10-12 LAB — BASIC METABOLIC PANEL
BUN: 14 mg/dL (ref 6–23)
CHLORIDE: 96 meq/L (ref 96–112)
CO2: 28 mEq/L (ref 19–32)
Calcium: 9.2 mg/dL (ref 8.4–10.5)
Creatinine, Ser: 0.7 mg/dL (ref 0.4–1.2)
GFR: 85.4 mL/min (ref >60.00)
Glucose, Bld: 185 mg/dL — ABNORMAL HIGH (ref 70–99)
POTASSIUM: 3 meq/L — AB (ref 3.5–5.1)
SODIUM: 135 meq/L (ref 135–145)

## 2013-10-12 NOTE — Telephone Encounter (Signed)
Relevant patient education assigned to patient using Emmi. ° °

## 2013-10-12 NOTE — Telephone Encounter (Signed)
, °

## 2013-10-13 ENCOUNTER — Other Ambulatory Visit: Payer: Self-pay | Admitting: Family Medicine

## 2013-10-13 ENCOUNTER — Telehealth: Payer: Self-pay | Admitting: *Deleted

## 2013-10-13 DIAGNOSIS — I10 Essential (primary) hypertension: Secondary | ICD-10-CM

## 2013-10-13 MED ORDER — LISINOPRIL 20 MG PO TABS: 20.0000 mg | ORAL_TABLET | Freq: Every day | ORAL | Status: DC

## 2013-10-13 NOTE — Telephone Encounter (Signed)
Pt informed of the results and to discontinue Lisinopril/HCTZ as the HCTZ is the diuretic part that can make the potassium low and to begin taking Lisinopril which does not have a diuretic and this was sent to her pharmacy.  She asked if she needs to be taking a potassium supplement and I advised her the message will be sent to Dr Maudie Mercury.

## 2013-10-13 NOTE — Telephone Encounter (Signed)
Patient informed. 

## 2013-10-13 NOTE — Telephone Encounter (Signed)
I think this will correct with the medication change. If not we may do potassium.

## 2013-10-19 ENCOUNTER — Encounter: Payer: Self-pay | Admitting: Family Medicine

## 2013-10-19 ENCOUNTER — Ambulatory Visit (INDEPENDENT_AMBULATORY_CARE_PROVIDER_SITE_OTHER): Payer: Commercial Managed Care - HMO | Admitting: Family Medicine

## 2013-10-19 VITALS — BP 138/78 | HR 88 | Temp 98.2°F | Ht 61.25 in | Wt 205.5 lb

## 2013-10-19 DIAGNOSIS — I10 Essential (primary) hypertension: Secondary | ICD-10-CM

## 2013-10-19 DIAGNOSIS — E876 Hypokalemia: Secondary | ICD-10-CM

## 2013-10-19 DIAGNOSIS — E119 Type 2 diabetes mellitus without complications: Secondary | ICD-10-CM

## 2013-10-19 MED ORDER — SITAGLIPTIN PHOSPHATE 50 MG PO TABS: 50.0000 mg | ORAL_TABLET | Freq: Every day | ORAL | Status: DC

## 2013-10-19 NOTE — Patient Instructions (Addendum)
-  follow up in in 2-3 weeks to check the potassium - schedule a lab only appointment for this  -follow up 4 months  -As we discussed, we have prescribed a new medication (JANUVIA)for you at this appointment. We discussed the common and serious potential adverse effects of this medication and you can review these and more with the pharmacist when you pick up your medication.  Please follow the instructions for use carefully and notify us immediately if you have any problems taking this medication.  FOR YOUR DIABETES:  []  Eat a healthy low carb diet (avoid sweets, sweet drinks, breads, potatoes, rice, etc.) and ensure 3 small meals daily.  []  Get AT LEAST 150 minutes of cardiovascular exercise per week - 30 minutes per day is best of sustained sweaty exercise.  []  Take all of your medications every day as directed by your doctor. Call your doctor immediately if you have any questions about your medications or are running low.  []  Check your blood sugar often and when you feel unwell and keep a log to bring to all health appointments. FASTING: before you eat anything in the morning POSTPRANDIAL: 1-2 hours after a meal  []  If any low blood sugars < 70, eat a snack and call your doctor immediately.  []  See an eye doctor every year and fax your diabetic eye exam to our office.  Fax: [phone]  []  Take good care of your feet and keep them soft and callus free. Check your feet daily and wear comfortable shoes. See your doctor immediately if you have any cuts, calluses or wounds on your feet.  Follow up in 3 months

## 2013-10-19 NOTE — Progress Notes (Signed)
No chief complaint on file.   HPI:  Follow up hypertension and hypokalemia: -had low potassium so stopped diuretic -reports feeling ok -denies: CP, SOB, swelling, HA, BP elevation  Diabetes Mellitis: -uncontrolled on recent check -she does take her metformin most days -checking blood sugar: 130- 160 -denies: poyluria, polydipsia, nausea, vomiting   ROS: See pertinent positives and negatives per HPI.  Past Medical History  Diagnosis Date  . Diabetes mellitus   . Hypertension   . Arthritis   . Hearing loss   . Cancer     right breast   . History of radiation therapy 04/29/10-05/05/10    right breast 34Gy/42fx  . Actinic keratosis 07/18/2008    Qualifier: Diagnosis of  By: Arnoldo Morale MD, Balinda Quails   . Closed fracture of one or more phalanges of foot 03/29/2008    Qualifier: Diagnosis of  By: Arnoldo Morale MD, Balinda Quails   . No blood products (Jehovah Witness)  09/11/2013  . RESTLESS LEGS SYNDROME 05/05/2007    Qualifier: Diagnosis of  By: Gwenette Greet MD, Armando Reichert   . Radiation 05/05/2010    Right Breast mammosite    Past Surgical History  Procedure Laterality Date  . Flex signoidoscopy    . Incision and drainage perirectal abscess    . Appendectomy    . Tubal ligation  1979  . Knee surgery  2006    right knee   . Breast surgery      rt breast lumpectomy    Family History  Problem Relation Age of Onset  . Cancer Sister     breast  . Heart disease Paternal Grandfather     History   Social History  . Marital Status: Married    Spouse Name: N/A    Number of Children: N/A  . Years of Education: N/A   Social History Main Topics  . Smoking status: Never Smoker   . Smokeless tobacco: None  . Alcohol Use: No  . Drug Use: No  . Sexual Activity: Yes   Other Topics Concern  . None   Social History Narrative   Work or School: at home      Home Situation: lives with husband      Spiritual Beliefs: Jehovah Witness - no blood products      Lifestyle: no regular exercise; diet  poor             Current outpatient prescriptions:amLODipine (NORVASC) 10 MG tablet, Take 1 tablet (10 mg total) by mouth daily., Disp: 90 tablet, Rfl: 3;  anastrozole (ARIMIDEX) 1 MG tablet, TAKE ONE TABLET BY MOUTH ONCE DAILY, Disp: 30 tablet, Rfl: 3;  Cholecalciferol (VITAMIN D PO), Take by mouth daily., Disp: , Rfl: ;  glucose blood (ACCU-CHEK AVIVA) test strip, Test once per day, Disp: 100 each, Rfl: 3 lisinopril (PRINIVIL,ZESTRIL) 20 MG tablet, Take 1 tablet (20 mg total) by mouth daily., Disp: 30 tablet, Rfl: 3;  metFORMIN (GLUCOPHAGE) 1000 MG tablet, 1000mg  (1 tablet) twice daily, Disp: 180 tablet, Rfl: 3;  oxybutynin (DITROPAN) 5 MG tablet, Take 1 tablet (5 mg total) by mouth 2 (two) times daily., Disp: 60 tablet, Rfl: 3;  pramipexole (MIRAPEX) 1 MG tablet, TAKE ONE TABLET BY MOUTH TWICE DAILY, Disp: 60 tablet, Rfl: 0 triamcinolone cream (KENALOG) 0.1 %, Apply topically 2 (two) times daily., Disp: 30 g, Rfl: 0;  sitaGLIPtin (JANUVIA) 50 MG tablet, Take 1 tablet (50 mg total) by mouth daily., Disp: 30 tablet, Rfl: 3  EXAM:  Filed Vitals:   10/19/13  0845  BP: 138/78  Pulse: 88  Temp: 98.2 F (36.8 C)    Body mass index is 38.5 kg/(m^2).  GENERAL: vitals reviewed and listed above, alert, oriented, appears well hydrated and in no acute distress  HEENT: atraumatic, conjunttiva clear, no obvious abnormalities on inspection of external nose and ears  NECK: no obvious masses on inspection  LUNGS: clear to auscultation bilaterally, no wheezes, rales or rhonchi, good air movement  CV: HRRR, no peripheral edema  MS: moves all extremities without noticeable abnormality  PSYCH: pleasant and cooperative, no obvious depression or anxiety  ASSESSMENT AND PLAN:  Discussed the following assessment and plan:  HYPERTENSION - Plan: Basic metabolic panel  Hypokalemia  DIABETES MELLITUS, TYPE II - Plan: sitaGLIPtin (JANUVIA) 50 MG tablet  -BP ok today -will recheck BMP -tolerating  change in medication well -discussed options, risks for treatment of diabetes and opted for [place] -Patient advised to return or notify a doctor immediately if symptoms worsen or persist or new concerns arise.  Patient Instructions  -follow up in in 2-3 weeks to check the potassium - schedule a lab only appointment for this  -follow up 4 months  -As we discussed, we have prescribed a new medication (JANUVIA)for you at this appointment. We discussed the common and serious potential adverse effects of this medication and you can review these and more with the pharmacist when you pick up your medication.  Please follow the instructions for use carefully and notify us immediately if you have any problems taking this medication.  FOR YOUR DIABETES:  []  Eat a healthy low carb diet (avoid sweets, sweet drinks, breads, potatoes, rice, etc.) and ensure 3 small meals daily.  []  Get AT LEAST 150 minutes of cardiovascular exercise per week - 30 minutes per day is best of sustained sweaty exercise.  []  Take all of your medications every day as directed by your doctor. Call your doctor immediately if you have any questions about your medications or are running low.  []  Check your blood sugar often and when you feel unwell and keep a log to bring to all health appointments. FASTING: before you eat anything in the morning POSTPRANDIAL: 1-2 hours after a meal  []  If any low blood sugars < 70, eat a snack and call your doctor immediately.  []  See an eye doctor every year and fax your diabetic eye exam to our office.  Fax: [phone]  []  Take good care of your feet and keep them soft and callus free. Check your feet daily and wear comfortable shoes. See your doctor immediately if you have any cuts, calluses or wounds on your feet.  Follow up in 3 months       [name]

## 2013-10-19 NOTE — Progress Notes (Signed)
Pre visit review using our clinic review tool, if applicable. No additional management support is needed unless otherwise documented below in the visit note. 

## 2013-10-25 ENCOUNTER — Other Ambulatory Visit: Payer: Medicare Other

## 2013-10-25 ENCOUNTER — Ambulatory Visit: Payer: Medicare Other | Admitting: Oncology

## 2013-10-27 ENCOUNTER — Other Ambulatory Visit (INDEPENDENT_AMBULATORY_CARE_PROVIDER_SITE_OTHER): Payer: Commercial Managed Care - HMO

## 2013-10-27 DIAGNOSIS — I10 Essential (primary) hypertension: Secondary | ICD-10-CM

## 2013-10-27 LAB — BASIC METABOLIC PANEL
BUN: 13 mg/dL (ref 6–23)
CALCIUM: 9.3 mg/dL (ref 8.4–10.5)
CO2: 26 mEq/L (ref 19–32)
Chloride: 101 mEq/L (ref 96–112)
Creatinine, Ser: 0.6 mg/dL (ref 0.4–1.2)
GFR: 114.65 mL/min (ref >60.00)
Glucose, Bld: 142 mg/dL — ABNORMAL HIGH (ref 70–99)
Potassium: 3.5 mEq/L (ref 3.5–5.1)
Sodium: 136 mEq/L (ref 135–145)

## 2013-11-03 ENCOUNTER — Other Ambulatory Visit: Payer: Self-pay | Admitting: Internal Medicine

## 2013-12-13 ENCOUNTER — Telehealth: Payer: Self-pay | Admitting: Internal Medicine

## 2013-12-13 NOTE — Telephone Encounter (Signed)
[facility] [place], [place] is requesting re-fill on pramipexole (MIRAPEX) 1 MG tablet Pt is scheduled to see Dr. Maudie Mercury 02/19/14

## 2013-12-14 MED ORDER — PRAMIPEXOLE DIHYDROCHLORIDE 1 MG PO TABS: ORAL_TABLET | ORAL | Status: DC

## 2013-12-14 NOTE — Addendum Note (Signed)
Addended by: Agnes Lawrence on: 12/14/2013 09:20 AM   Modules accepted: Orders

## 2013-12-14 NOTE — Telephone Encounter (Signed)
Ok

## 2013-12-14 NOTE — Telephone Encounter (Signed)
Rx done. 

## 2014-01-09 ENCOUNTER — Other Ambulatory Visit: Payer: Self-pay | Admitting: Family Medicine

## 2014-01-17 ENCOUNTER — Encounter: Payer: Medicare HMO | Attending: Family Medicine | Admitting: Dietician

## 2014-01-17 ENCOUNTER — Encounter: Payer: Self-pay | Admitting: Dietician

## 2014-01-17 VITALS — Ht 62.5 in | Wt 190.2 lb

## 2014-01-17 DIAGNOSIS — Z713 Dietary counseling and surveillance: Secondary | ICD-10-CM | POA: Diagnosis not present

## 2014-01-17 DIAGNOSIS — E119 Type 2 diabetes mellitus without complications: Secondary | ICD-10-CM | POA: Diagnosis present

## 2014-01-17 NOTE — Progress Notes (Signed)
  Medical Nutrition Therapy:  Appt start time: 0930 end time:  1045.   Assessment:  Primary concerns today: Diabetes. Referred by physician. Per patient - Diagnosed probably 20 years ago. Diabetes education a long time ago, doesn't remember, need refresher for diabetes education.  Blood Sugar 141 this morning, first time taking it in a long time.   Preferred Learning Style:   No preference indicated   Learning Readiness:   Contemplating  MEDICATIONS: see chart   DIETARY INTAKE:  Usual eating pattern includes 2 meals and 2 snacks per day.   24-hr recall: Wake: 6:00 a.m. To get granddaughter off to school then back to bed until 9:00-10:00  Diet soda B ( 11:00-12:00 AM):  Snk ( AM):  L ( PM): Wendy's or McDonald's - sandwich and junior frosty or sausage burrito, diet soda  Snk ( PM):  raisins, diet soda, nuts,  D (4:00-5:00 PM): pork or beef and one vegetables-green beans, corn, peas, sourkraut, potatoes, sweet potatoes, diet soda Snk ( PM): frozen cookies, rolls Beverages: diet soda, sometimes water -wit pills and at night  Drinks water at night  Usual physical activity: None  Estimated energy needs: 1800 calories 200 g carbohydrates 135 g protein 50 g fat  Progress Towards Goal(s):  No progress.   Nutritional Diagnosis:  NB-1.1 Food and nutrition-related knowledge deficit As related to lack of knowledge about blood sugar management and food and how it affects blood sugar.  As evidenced by A1C greater than 8.0 and patient report.    Intervention:  Nutrition education and counseling.  Discussed disease of diabetes, what elevates and lowers blood sugar, pre- and post-meal appropriate blood sugar ranges, signs of high blood sugar, long term complications of diabetes, carbohydrate counting, and how activity affects blood sugar.  Plan:  Aim for 3 Carb Choices per meal (45 grams) +/- 1 either way  Aim for 0-15 grams or one carbohydrate choice per snack if  hungry Consider checking blood sugar 2 hours after common meals to learn how it affects blood sugar Blood sugar targets: 70-130 before meals, less than 180 2 hours after the start of a meal Consider including protein with meals and snacks Consider reading food labels for Total Carbohydrate and Fat Grams of foods (15 grams = one serving) Consider  increasing your activity level as able  Teaching Method Utilized:  Visual Auditory Hands on  Handouts given during visit include:  Yellow card  My Plate  Arm chair exercises  Barriers to learning/adherence to lifestyle change: none  Demonstrated degree of understanding via:  Teach Back   Monitoring/Evaluation:  Dietary intake, exercise, carbohydrate counting, and body weight prn.

## 2014-01-17 NOTE — Patient Instructions (Signed)
Plan:  Aim for 3 Carb Choices per meal (45 grams) +/- 1 either way  Aim for 0-15 grams or one carbohydrate choice per snack if hungry Consider checking blood sugar 2 hours after common meals to learn how it affects blood sugar Blood sugar targets: 70-130 before meals, less than 180 2 hours after the start of a meal Consider including protein with meals and snacks Consider reading food labels for Total Carbohydrate and Fat Grams of foods (15 grams = one serving) Consider  increasing your activity level as able

## 2014-01-25 ENCOUNTER — Other Ambulatory Visit (HOSPITAL_BASED_OUTPATIENT_CLINIC_OR_DEPARTMENT_OTHER): Payer: Commercial Managed Care - HMO

## 2014-01-25 ENCOUNTER — Encounter: Payer: Self-pay | Admitting: Hematology and Oncology

## 2014-01-25 ENCOUNTER — Telehealth: Payer: Self-pay | Admitting: Hematology and Oncology

## 2014-01-25 ENCOUNTER — Ambulatory Visit (HOSPITAL_BASED_OUTPATIENT_CLINIC_OR_DEPARTMENT_OTHER): Payer: Commercial Managed Care - HMO | Admitting: Hematology and Oncology

## 2014-01-25 ENCOUNTER — Telehealth: Payer: Self-pay | Admitting: Family Medicine

## 2014-01-25 ENCOUNTER — Other Ambulatory Visit: Payer: Self-pay

## 2014-01-25 VITALS — BP 158/86 | HR 79 | Temp 97.5°F | Resp 20 | Ht 62.5 in | Wt 193.9 lb

## 2014-01-25 DIAGNOSIS — C50919 Malignant neoplasm of unspecified site of unspecified female breast: Secondary | ICD-10-CM

## 2014-01-25 DIAGNOSIS — M25569 Pain in unspecified knee: Secondary | ICD-10-CM

## 2014-01-25 LAB — CBC WITH DIFFERENTIAL/PLATELET
BASO%: 0.7 % (ref 0.0–2.0)
Basophils Absolute: 0.1 10*3/uL (ref 0.0–0.1)
EOS ABS: 0.4 10*3/uL (ref 0.0–0.5)
EOS%: 3.7 % (ref 0.0–7.0)
HCT: 47.7 % — ABNORMAL HIGH (ref 34.8–46.6)
HGB: 15.5 g/dL (ref 11.6–15.9)
LYMPH#: 2 10*3/uL (ref 0.9–3.3)
LYMPH%: 19.4 % (ref 14.0–49.7)
MCH: 30.3 pg (ref 25.1–34.0)
MCHC: 32.6 g/dL (ref 31.5–36.0)
MCV: 92.8 fL (ref 79.5–101.0)
MONO#: 0.9 10*3/uL (ref 0.1–0.9)
MONO%: 9 % (ref 0.0–14.0)
NEUT%: 67.2 % (ref 38.4–76.8)
NEUTROS ABS: 6.9 10*3/uL — AB (ref 1.5–6.5)
Platelets: 271 10*3/uL (ref 145–400)
RBC: 5.13 10*6/uL (ref 3.70–5.45)
RDW: 13.6 % (ref 11.2–14.5)
WBC: 10.2 10*3/uL (ref 3.9–10.3)

## 2014-01-25 LAB — COMPREHENSIVE METABOLIC PANEL (CC13)
ALBUMIN: 3.4 g/dL — AB (ref 3.5–5.0)
ALT: 18 U/L (ref 0–55)
AST: 18 U/L (ref 5–34)
Alkaline Phosphatase: 97 U/L (ref 40–150)
Anion Gap: 11 mEq/L (ref 3–11)
BILIRUBIN TOTAL: 0.3 mg/dL (ref 0.20–1.20)
BUN: 13.6 mg/dL (ref 7.0–26.0)
CHLORIDE: 101 meq/L (ref 98–109)
CO2: 27 meq/L (ref 22–29)
Calcium: 9.3 mg/dL (ref 8.4–10.4)
Creatinine: 0.8 mg/dL (ref 0.6–1.1)
Glucose: 165 mg/dl — ABNORMAL HIGH (ref 70–140)
POTASSIUM: 3.8 meq/L (ref 3.5–5.1)
SODIUM: 139 meq/L (ref 136–145)
Total Protein: 7.3 g/dL (ref 6.4–8.3)

## 2014-01-25 NOTE — Progress Notes (Signed)
Health Care Power of Attorney - No  Blood received from patient.  Sent to scan.

## 2014-01-25 NOTE — Addendum Note (Signed)
Addended by: Prentiss Bells on: 01/25/2014 12:14 PM   Modules accepted: Orders

## 2014-01-25 NOTE — Progress Notes (Signed)
Patient Care Team: [name], DO as PCP - General (Family Medicine)  DIAGNOSIS: No matching staging information was found for the patient.  SUMMARY OF ONCOLOGIC HISTORY:   BREAST CANCER Right T1b N0 S/P lumpectomy 04/2010, Partial breast radiation, Arimadex   03/12/2010 Initial Diagnosis Invasive lobular cancer, ER 100%, PR 88% Ki-67 13%, HER-2 negative ratio 1.3   04/23/2010 Surgery Right breast lumpectomy invasive ductal carcinoma grade 1, 1 cm size one sentinel lymph node negative   04/28/2010 - 05/05/2010 Radiation Therapy Accelerated partial breast radiation with MammoSite   05/27/2010 -  Anti-estrogen oral therapy Arimidex 1 mg daily    CHIEF COMPLIANT: Six-month followup with history of breast cancer  INTERVAL HISTORY: [name] is a 73 year old Caucasian lady with above-mentioned history of invasive lobular cancer of the right breast. She was treated with lumpectomy and APBI and she has been on Arimidex as January 2012. She is tolerating it very well without any major problems. She denies any major problems with the breast. The area where she had radiation feels thickened and nodular. She complains of left knee pain which she needs to be replaced. She is debating when to do it. She spends her time with recreation in [place] didn't have a couple of tablets and this time outdoors on their fourwheelers.   REVIEW OF SYSTEMS:   Constitutional: Denies fevers, chills or abnormal weight loss Eyes: Denies blurriness of vision Ears, nose, mouth, throat, and face: Denies mucositis or sore throat Respiratory: Denies cough, dyspnea or wheezes Cardiovascular: Denies palpitation, chest discomfort or lower extremity swelling Gastrointestinal:  Denies nausea, heartburn or change in bowel habits Skin: Denies abnormal skin rashes Lymphatics: Denies new lymphadenopathy or easy bruising Neurological:Denies numbness, tingling or new weaknesses Behavioral/Psych: Mood is stable, no new changes   Breast:  denies any pain or lumps or nodules in either breasts All other systems were reviewed with the patient and are negative.  I have reviewed the past medical history, past surgical history, social history and family history with the patient and they are unchanged from previous note.  ALLERGIES:  is allergic to sulfonamide derivatives.  MEDICATIONS:  Current Outpatient Prescriptions  Medication Sig Dispense Refill  . amLODipine (NORVASC) 10 MG tablet Take 1 tablet (10 mg total) by mouth daily.  90 tablet  3  . anastrozole (ARIMIDEX) 1 MG tablet TAKE ONE TABLET BY MOUTH ONCE DAILY  30 tablet  3  . Cholecalciferol (VITAMIN D PO) Take by mouth daily.      Marland Kitchen glucose blood (ACCU-CHEK AVIVA) test strip Test once per day  100 each  3  . lisinopril (PRINIVIL,ZESTRIL) 20 MG tablet Take 1 tablet (20 mg total) by mouth daily.  30 tablet  3  . metFORMIN (GLUCOPHAGE) 1000 MG tablet 1011m (1 tablet) twice daily  180 tablet  3  . oxybutynin (DITROPAN) 5 MG tablet TAKE ONE TABLET BY MOUTH TWICE DAILY  60 tablet  0  . pramipexole (MIRAPEX) 1 MG tablet TAKE ONE TABLET BY MOUTH TWICE DAILY  60 tablet  0  . triamcinolone cream (KENALOG) 0.1 % Apply topically 2 (two) times daily.  30 g  0   No current facility-administered medications for this visit.    PHYSICAL EXAMINATION: ECOG PERFORMANCE STATUS: 0 - Asymptomatic  Filed Vitals:   01/25/14 1017  BP: 158/86  Pulse: 79  Temp: 97.5 F (36.4 C)  Resp: 20   Filed Weights   01/25/14 1017  Weight: 193 lb 14.4 oz (87.952 kg)  GENERAL:alert, no distress and comfortable SKIN: skin color, texture, turgor are normal, no rashes or significant lesions EYES: normal, Conjunctiva are pink and non-injected, sclera clear OROPHARYNX:no exudate, no erythema and lips, buccal mucosa, and tongue normal  NECK: supple, thyroid normal size, non-tender, without nodularity LYMPH:  no palpable lymphadenopathy in the cervical, axillary or inguinal LUNGS: clear  to auscultation and percussion with normal breathing effort HEART: regular rate & rhythm and no murmurs and no lower extremity edema ABDOMEN:abdomen soft, non-tender and normal bowel sounds Musculoskeletal:no cyanosis of digits and no clubbing  NEURO: alert & oriented x 3 with fluent speech, no focal motor/sensory deficits BREAST: No palpable masses lungs or nodules in either right or left breasts. No palpable axillary supraclavicular or infraclavicular adenopathy no breast tenderness or nipple discharge. The area where she got radiation appears to be thickened and dense and nodular with thats how it has been since radiation was done   LABORATORY DATA:  I have reviewed the data as listed   Chemistry      Component Value Date/Time   NA 139 01/25/2014 0959   NA 136 10/27/2013 1101   K 3.8 01/25/2014 0959   K 3.5 10/27/2013 1101   CL 101 10/27/2013 1101   CL 99 10/06/2012 0855   CO2 27 01/25/2014 0959   CO2 26 10/27/2013 1101   BUN 13.6 01/25/2014 0959   BUN 13 10/27/2013 1101   CREATININE 0.8 01/25/2014 0959   CREATININE 0.6 10/27/2013 1101   CREATININE 0.90 10/03/2010 1635      Component Value Date/Time   CALCIUM 9.3 01/25/2014 0959   CALCIUM 9.3 10/27/2013 1101   ALKPHOS 97 01/25/2014 0959   ALKPHOS 79 10/16/2011 1348   AST 18 01/25/2014 0959   AST 20 10/16/2011 1348   ALT 18 01/25/2014 0959   ALT 17 10/16/2011 1348   BILITOT 0.30 01/25/2014 0959   BILITOT 0.7 10/16/2011 1348       Lab Results  Component Value Date   WBC 10.2 01/25/2014   HGB 15.5 01/25/2014   HCT 47.7* 01/25/2014   MCV 92.8 01/25/2014   PLT 271 01/25/2014   NEUTROABS 6.9* 01/25/2014     RADIOGRAPHIC STUDIES: I have personally reviewed the radiology reports and agreed with their findings. No results found.   ASSESSMENT & PLAN:  BREAST CANCER Right T1b N0 S/P lumpectomy 04/2010, Partial breast radiation, Arimadex Right breast invasive lobular cancer ER/PR positive HER-2 negative status post lumpectomy in 2011 followed by  accelerated partial breast radiation with MammoSite. She has been on antiestrogen therapy with Arimidex 1 mg daily since January 2012. She is taken antiestrogen therapy for the past 3 and half years. She has been tolerating it extremely well without any major problems. Her DEXA scan done in 2014 does not show osteoporosis. Mammograms are usually done in November and she will need to be set up for the mammograms this coming November. Physical exam today does not reveal any lumps or nodules in the breast.  Discussed the importance of physical exercise in decreasing the likelihood of breast cancer recurrence. Recommended 30 mins daily 6 days a week of either brisk walking or cycling or swimming. Encouraged patient to eat more fruits and vegetables and decrease red meat. Patient is limited by left knee pain that limits her activities. She plans to get knee replacement surgery soon. I recommended that she stop antiestrogen therapy for one month before and after to decrease the risk of any blood clots.     No  orders of the defined types were placed in this encounter.   The patient has a good understanding of the overall plan. she agrees with it. She will call with any problems that may develop before her next visit here.  I spent 25 minutes counseling the patient face to face. The total time spent in the appointment was 30 minutes and more than 50% was on counseling and review of test results    Rulon Eisenmenger, MD 01/25/2014 10:53 AM

## 2014-01-25 NOTE — Telephone Encounter (Signed)
I called the pt and informed her Dr Maudie Mercury entered the order for the referral.

## 2014-01-25 NOTE — Telephone Encounter (Signed)
per pof to sch pt appt-sent [facility] an email to put order in for mamma so I can sch-will call pt once reply from [facility]

## 2014-01-25 NOTE — Assessment & Plan Note (Signed)
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 She has been on antiestrogen therapy with Arimidex 1 mg daily since January 2012. She is taken antiestrogen therapy for the past 3 and half years. She has been tolerating it extremely well without any major problems. Her DEXA scan done in 2014 does not show osteoporosis. Mammograms are usually done in November and she will need to be set up for the mammograms this coming November. Physical exam today does not reveal any lumps or nodules in the breast.  Discussed the importance of physical exercise in decreasing the likelihood of breast cancer recurrence. Recommended 30 mins daily 6 days a week of either brisk walking or cycling or swimming. Encouraged patient to eat more fruits and vegetables and decrease red meat. Patient is limited by left knee pain that limits her activities. She plans to get knee replacement surgery soon. I recommended that she stop antiestrogen therapy for one month before and after to decrease the risk of any blood clots.

## 2014-01-25 NOTE — Telephone Encounter (Signed)
Sure - I think she was seeing him already.

## 2014-01-25 NOTE — Telephone Encounter (Signed)
Pt called to ask for a referral to see Dr Pilar Plate Aluisio orthopedic to have her left knee replace

## 2014-01-26 ENCOUNTER — Other Ambulatory Visit: Payer: Self-pay | Admitting: Hematology and Oncology

## 2014-01-26 DIAGNOSIS — C50919 Malignant neoplasm of unspecified site of unspecified female breast: Secondary | ICD-10-CM

## 2014-02-07 ENCOUNTER — Emergency Department (HOSPITAL_COMMUNITY): Payer: Medicare HMO

## 2014-02-07 ENCOUNTER — Emergency Department (HOSPITAL_COMMUNITY)
Admission: EM | Admit: 2014-02-07 | Discharge: 2014-02-07 | Disposition: A | Discharge status: Home / Self Care | Payer: Medicare HMO | Attending: Emergency Medicine | Admitting: Emergency Medicine

## 2014-02-07 ENCOUNTER — Encounter (HOSPITAL_COMMUNITY): Payer: Self-pay | Admitting: Emergency Medicine

## 2014-02-07 DIAGNOSIS — Z853 Personal history of malignant neoplasm of breast: Secondary | ICD-10-CM | POA: Diagnosis not present

## 2014-02-07 DIAGNOSIS — M25561 Pain in right knee: Secondary | ICD-10-CM

## 2014-02-07 DIAGNOSIS — W1809XA Striking against other object with subsequent fall, initial encounter: Secondary | ICD-10-CM | POA: Diagnosis not present

## 2014-02-07 DIAGNOSIS — I10 Essential (primary) hypertension: Secondary | ICD-10-CM | POA: Diagnosis not present

## 2014-02-07 DIAGNOSIS — H9190 Unspecified hearing loss, unspecified ear: Secondary | ICD-10-CM | POA: Insufficient documentation

## 2014-02-07 DIAGNOSIS — S0003XA Contusion of scalp, initial encounter: Secondary | ICD-10-CM | POA: Insufficient documentation

## 2014-02-07 DIAGNOSIS — W010XXA Fall on same level from slipping, tripping and stumbling without subsequent striking against object, initial encounter: Secondary | ICD-10-CM | POA: Diagnosis not present

## 2014-02-07 DIAGNOSIS — S0083XA Contusion of other part of head, initial encounter: Secondary | ICD-10-CM | POA: Insufficient documentation

## 2014-02-07 DIAGNOSIS — Y9389 Activity, other specified: Secondary | ICD-10-CM | POA: Insufficient documentation

## 2014-02-07 DIAGNOSIS — S99919A Unspecified injury of unspecified ankle, initial encounter: Secondary | ICD-10-CM

## 2014-02-07 DIAGNOSIS — E119 Type 2 diabetes mellitus without complications: Secondary | ICD-10-CM | POA: Diagnosis not present

## 2014-02-07 DIAGNOSIS — S1093XA Contusion of unspecified part of neck, initial encounter: Principal | ICD-10-CM

## 2014-02-07 DIAGNOSIS — S50311A Abrasion of right elbow, initial encounter: Secondary | ICD-10-CM

## 2014-02-07 DIAGNOSIS — S99929A Unspecified injury of unspecified foot, initial encounter: Secondary | ICD-10-CM

## 2014-02-07 DIAGNOSIS — G2581 Restless legs syndrome: Secondary | ICD-10-CM | POA: Diagnosis not present

## 2014-02-07 DIAGNOSIS — IMO0002 Reserved for concepts with insufficient information to code with codable children: Secondary | ICD-10-CM | POA: Diagnosis not present

## 2014-02-07 DIAGNOSIS — Z872 Personal history of diseases of the skin and subcutaneous tissue: Secondary | ICD-10-CM | POA: Diagnosis not present

## 2014-02-07 DIAGNOSIS — Z8781 Personal history of (healed) traumatic fracture: Secondary | ICD-10-CM | POA: Insufficient documentation

## 2014-02-07 DIAGNOSIS — S0990XA Unspecified injury of head, initial encounter: Secondary | ICD-10-CM | POA: Diagnosis present

## 2014-02-07 DIAGNOSIS — Z79899 Other long term (current) drug therapy: Secondary | ICD-10-CM | POA: Insufficient documentation

## 2014-02-07 DIAGNOSIS — S8990XA Unspecified injury of unspecified lower leg, initial encounter: Secondary | ICD-10-CM | POA: Diagnosis not present

## 2014-02-07 DIAGNOSIS — M129 Arthropathy, unspecified: Secondary | ICD-10-CM | POA: Diagnosis not present

## 2014-02-07 DIAGNOSIS — Y9289 Other specified places as the place of occurrence of the external cause: Secondary | ICD-10-CM | POA: Diagnosis not present

## 2014-02-07 DIAGNOSIS — W19XXXA Unspecified fall, initial encounter: Secondary | ICD-10-CM

## 2014-02-07 MED ORDER — OXYCODONE-ACETAMINOPHEN 5-325 MG PO TABS
1.0000 | ORAL_TABLET | Freq: Once | ORAL | Status: AC
  Administered 2014-02-07: 1 via ORAL
  Filled 2014-02-07: qty 1

## 2014-02-07 NOTE — ED Notes (Signed)
Pt was at [employer] and she tripped and fell. Pt struck her knee and head on the concrete floor and her right arm on a sink display. Hematoma noted to the right anterior forehead. Abrasions noted to the right hand and knee. Skin tear to right elbow. Bleeding controlled. Denies any neck or back pain. Pt denies any dizziness prior to the fall but she felt some after the fall. Denies any visual changes or other neurological symptoms. Pt NSR on the monitor with some occasional PVCs. Pt is not on blood thinners. Pt A&Ox4, resp e/u, and skin warm and dry.

## 2014-02-07 NOTE — ED Provider Notes (Signed)
CSN: [id number]     Arrival date & time 02/07/14  1620 History   First MD Initiated Contact with Patient 02/07/14 1620     Chief Complaint  Patient presents with  . Fall   HPI [name] si a 73 y.o. female who presents from scene of ground level mechanical fall at [place].  She tripped and fell, striking her head and right knee on the concrete and her right arm on a sink.  She notes pain to right forehead, right elbow and right knee. She was ambulatory on scene.  C spine immobilized with towels by EMS. VSS.  She denies vision changes, neck pain, unilateral weakness, slurred speech, sensory deficit.  She only notes mild pain. There were no presyncopal sx. Denies CP/SOB.  Does not take anticoagulants.    Past Medical History  Diagnosis Date  . Diabetes mellitus   . Hypertension   . Arthritis   . Hearing loss   . Cancer     right breast   . History of radiation therapy 04/29/10-05/05/10    right breast 34Gy/66fx  . Actinic keratosis 07/18/2008    Qualifier: Diagnosis of  By: Arnoldo Morale MD, Balinda Quails   . Closed fracture of one or more phalanges of foot 03/29/2008    Qualifier: Diagnosis of  By: Arnoldo Morale MD, Balinda Quails   . No blood products (Jehovah Witness)  09/11/2013  . RESTLESS LEGS SYNDROME 05/05/2007    Qualifier: Diagnosis of  By: Gwenette Greet MD, Armando Reichert   . Radiation 05/05/2010    Right Breast mammosite  . Sleep apnea    Past Surgical History  Procedure Laterality Date  . Flex signoidoscopy    . Incision and drainage perirectal abscess    . Appendectomy    . Tubal ligation  1979  . Knee surgery  2006    right knee   . Breast surgery      rt breast lumpectomy   Family History  Problem Relation Age of Onset  . Cancer Sister     breast  . Heart disease Paternal Grandfather   . Diabetes Other   . Obesity Other    History  Substance Use Topics  . Smoking status: Never Smoker   . Smokeless tobacco: Not on file  . Alcohol Use: No   OB History   Grav Para Term Preterm  Abortions TAB SAB Ect Mult Living                 Review of Systems  Constitutional: Negative for fever and chills.  Respiratory: Negative for cough, shortness of breath and wheezing.   Cardiovascular: Negative for chest pain.  Gastrointestinal: Negative for nausea, vomiting and abdominal pain.  Musculoskeletal: Positive for arthralgias, gait problem and joint swelling. Negative for back pain and neck stiffness.  Skin: Negative for rash.  Neurological: Negative for dizziness, seizures, syncope, facial asymmetry, speech difficulty, weakness, light-headedness, numbness and headaches.  Hematological: Does not bruise/bleed easily.  All other systems reviewed and are negative.   Allergies  Sulfonamide derivatives  Home Medications   Prior to Admission medications   Medication Sig Start Date End Date Taking? Authorizing Provider  amLODipine (NORVASC) 10 MG tablet Take 1 tablet (10 mg total) by mouth daily. 06/05/13   Ricard Dillon, MD  anastrozole (ARIMIDEX) 1 MG tablet TAKE ONE TABLET BY MOUTH ONCE DAILY 10/12/13   Minette Headland, NP  Cholecalciferol (VITAMIN D PO) Take by mouth daily.    Historical Provider, MD  glucose blood (ACCU-CHEK AVIVA) test strip Test once per day 10/11/13   [name], DO  lisinopril (PRINIVIL,ZESTRIL) 20 MG tablet Take 1 tablet (20 mg total) by mouth daily. 10/13/13   [name], DO  metFORMIN (GLUCOPHAGE) 1000 MG tablet 1000mg  (1 tablet) twice daily 09/11/13   [name], DO  oxybutynin (DITROPAN) 5 MG tablet TAKE ONE TABLET BY MOUTH TWICE DAILY 01/09/14   [name], DO  pramipexole (MIRAPEX) 1 MG tablet TAKE ONE TABLET BY MOUTH TWICE DAILY 12/14/13   [name], DO  triamcinolone cream (KENALOG) 0.1 % Apply topically 2 (two) times daily. 06/20/12   [name], DO   BP 162/83  Pulse 85  Resp 25  SpO2 94% Physical Exam  Nursing note and vitals reviewed. Constitutional: She appears well-developed and well-nourished. No distress.  HENT:  Head:  Normocephalic.  Nose: Nose normal.  Hematoma to right forehead, soft. Mid face stable. Nose normal.  Eyes: Conjunctivae are normal. Pupils are equal, round, and reactive to light.  EOMI, conjugate gaze  Neck: Normal range of motion. Neck supple. No tracheal deviation present.  No midline TTP  Cardiovascular: Normal rate, regular rhythm, normal heart sounds and intact distal pulses.   No murmur heard. Pulses equal, strong  Pulmonary/Chest: Effort normal and breath sounds normal. No respiratory distress. She has no rales.  Abdominal: Soft. Bowel sounds are normal. She exhibits no distension and no mass. There is no tenderness. There is no guarding.  Musculoskeletal: Normal range of motion. She exhibits no edema and no tenderness.  Right knee with edema and TTP over patella.  Tib platuea without TTP. R elbow with TTP and edema.  No open wounds.  Neurological:  Alert and oriented x3. CN 3-12 tested and without deficit. 5/5 muscle strength in all extremities with flexion and extension.  Normal bulk and tone.  No sensory deficit to light touch.  Symmetric and equal 2+ patellar and brachioradialis DTRs.  No pronator drift.  Normal heel-to-shin and finger-to-nose.  Toes flexor bilaterally.   Skin: Skin is warm. No rash noted.  Psychiatric: She has a normal mood and affect.    ED Course  Procedures (including critical care time) Labs Review Labs Reviewed - No data to display  Imaging Review Dg Elbow Complete Right  02/07/2014   CLINICAL DATA:  73 year old female with fall at home. Pain in right elbow  EXAM: RIGHT ELBOW - COMPLETE 3+ VIEW  COMPARISON:  None.  FINDINGS: Diffuse osteopenia. No acute bony abnormality. Joint is congruent without dislocation/subluxation. No significant soft tissue swelling or joint effusion identified.  IMPRESSION: Negative for acute injury.  Osteopenia.  Signed,  Dulcy Fanny. Earleen Newport, DO  [facility]  [facility]    Electronically Signed   By: Corrie Mckusick O.D.   On: 02/07/2014 18:17   Ct Head Wo Contrast  02/07/2014   CLINICAL DATA:  [place].  Hit head.  EXAM: CT HEAD WITHOUT CONTRAST  CT CERVICAL SPINE WITHOUT CONTRAST  TECHNIQUE: Multidetector CT imaging of the head and cervical spine was performed following the standard protocol without intravenous contrast. Multiplanar CT image reconstructions of the cervical spine were also generated.  COMPARISON:  None.  FINDINGS: CT HEAD FINDINGS  A right frontal scalp hematoma is noted. No radiopaque foreign body is identified. No underlying skull fracture. The paranasal sinuses and mastoid air cells are clear. Bilateral hearing aids are noted. The middle ear cavities are clear.  Age related cerebral atrophy,  ventriculomegaly and periventricular white matter disease. No extra-axial fluid collections are identified. No CT findings for acute hemispheric infarction or intracranial hemorrhage. No mass lesions. The brainstem and cerebellum are normal.  CT CERVICAL SPINE FINDINGS  Advanced degenerative cervical spondylosis with multilevel disc disease and facet disease. Multilevel facet fusions are noted. There is reversal of the normal cervical lordosis which could be due to positioning, muscle spasm, pain or chronic change. C1-2 articulation degenerative changes are noted with pannus formation. Cystic changes noted in the dens.  No definite acute cervical spine fracture is identified. The upper lung fields are clear.  IMPRESSION: Right frontal scalp hematoma but no underlying skull fracture.  Age related cerebral atrophy, ventriculomegaly and periventricular white matter disease. No acute intracranial findings.  Advanced degenerative cervical spondylosis with multilevel disc disease and facet disease but no definite acute cervical spine fracture.   Electronically Signed   By: Kalman Jewels M.D.   On: 02/07/2014 19:00   Ct Cervical Spine Wo Contrast  02/07/2014   CLINICAL DATA:  [place].   Hit head.  EXAM: CT HEAD WITHOUT CONTRAST  CT CERVICAL SPINE WITHOUT CONTRAST  TECHNIQUE: Multidetector CT imaging of the head and cervical spine was performed following the standard protocol without intravenous contrast. Multiplanar CT image reconstructions of the cervical spine were also generated.  COMPARISON:  None.  FINDINGS: CT HEAD FINDINGS  A right frontal scalp hematoma is noted. No radiopaque foreign body is identified. No underlying skull fracture. The paranasal sinuses and mastoid air cells are clear. Bilateral hearing aids are noted. The middle ear cavities are clear.  Age related cerebral atrophy, ventriculomegaly and periventricular white matter disease. No extra-axial fluid collections are identified. No CT findings for acute hemispheric infarction or intracranial hemorrhage. No mass lesions. The brainstem and cerebellum are normal.  CT CERVICAL SPINE FINDINGS  Advanced degenerative cervical spondylosis with multilevel disc disease and facet disease. Multilevel facet fusions are noted. There is reversal of the normal cervical lordosis which could be due to positioning, muscle spasm, pain or chronic change. C1-2 articulation degenerative changes are noted with pannus formation. Cystic changes noted in the dens.  No definite acute cervical spine fracture is identified. The upper lung fields are clear.  IMPRESSION: Right frontal scalp hematoma but no underlying skull fracture.  Age related cerebral atrophy, ventriculomegaly and periventricular white matter disease. No acute intracranial findings.  Advanced degenerative cervical spondylosis with multilevel disc disease and facet disease but no definite acute cervical spine fracture.   Electronically Signed   By: Kalman Jewels M.D.   On: 02/07/2014 19:00   Dg Knee Complete 4 Views Right  02/07/2014   CLINICAL DATA:  Status post fall now with right knee pain ; history of previous right knee joint replacement  EXAM: RIGHT KNEE - COMPLETE 4+ VIEW   COMPARISON:  None  FINDINGS: The prosthetic components of the right knee are intact. The interface with the cement and native bone is normal. There is no acute native bone abnormality. The soft tissues are unremarkable.  IMPRESSION: There is no acute abnormality of the prosthetic right knee joint.   Electronically Signed   By: David  Martinique   On: 02/07/2014 18:17     EKG Interpretation None      MDM   Final diagnoses:  Fall, initial encounter  Traumatic hematoma of forehead, initial encounter  Elbow abrasion, right, initial encounter  Right knee pain    Pt presents from scene of ground level mechanical fall onto forehead.  Neuro exam normal.  C spine wo TTP.  Collared.  AAOx4, VSS, well appearing.  Bears weight on RLE.  TTP to right knee prosthesis and right elbow.  No long bone TTP.  XRs negative.  CT neg for fx or ICH.  Discussed RICE therapy.  Strict return precautions given for AMS, confusion, weakness, worsening pain.    Tammy Sours, MD 02/08/14 [phone]

## 2014-02-07 NOTE — ED Notes (Signed)
Pt monitored by pulse ox, bp cuff, and 5-lead. 

## 2014-02-08 NOTE — ED Provider Notes (Signed)
I saw and evaluated the patient, reviewed the resident's note and I agree with the findings and plan.   EKG Interpretation None      Patient here s/p fall, R forehead hematoma. Neurologically intact. Imaging normal. Stable for discharge.  Evelina Bucy, MD 02/08/14 [phone]

## 2014-02-10 ENCOUNTER — Other Ambulatory Visit: Payer: Self-pay | Admitting: Adult Health

## 2014-02-10 ENCOUNTER — Other Ambulatory Visit: Payer: Self-pay | Admitting: Family Medicine

## 2014-02-19 ENCOUNTER — Ambulatory Visit (INDEPENDENT_AMBULATORY_CARE_PROVIDER_SITE_OTHER): Payer: Commercial Managed Care - HMO | Admitting: Family Medicine

## 2014-02-19 ENCOUNTER — Other Ambulatory Visit: Payer: Self-pay | Admitting: Family Medicine

## 2014-02-19 ENCOUNTER — Encounter: Payer: Self-pay | Admitting: Family Medicine

## 2014-02-19 VITALS — BP 124/76 | HR 83 | Temp 97.9°F | Ht 62.5 in | Wt 197.0 lb

## 2014-02-19 DIAGNOSIS — R350 Frequency of micturition: Secondary | ICD-10-CM

## 2014-02-19 DIAGNOSIS — Z23 Encounter for immunization: Secondary | ICD-10-CM

## 2014-02-19 DIAGNOSIS — E119 Type 2 diabetes mellitus without complications: Secondary | ICD-10-CM

## 2014-02-19 DIAGNOSIS — I10 Essential (primary) hypertension: Secondary | ICD-10-CM

## 2014-02-19 DIAGNOSIS — G2581 Restless legs syndrome: Secondary | ICD-10-CM

## 2014-02-19 LAB — HEMOGLOBIN A1C: HEMOGLOBIN A1C: 8.8 % — AB (ref 4.6–6.5)

## 2014-02-19 LAB — MICROALBUMIN / CREATININE URINE RATIO
Creatinine,U: 69.8 mg/dL
MICROALB UR: 6.8 mg/dL — AB (ref 0.0–1.9)
Microalb Creat Ratio: 9.7 mg/g (ref 0.0–30.0)

## 2014-02-19 LAB — BASIC METABOLIC PANEL
BUN: 15 mg/dL (ref 6–23)
CALCIUM: 9 mg/dL (ref 8.4–10.5)
CHLORIDE: 96 meq/L (ref 96–112)
CO2: 25 mEq/L (ref 19–32)
Creatinine, Ser: 0.8 mg/dL (ref 0.4–1.2)
GFR: 73.28 mL/min (ref >60.00)
Glucose, Bld: 136 mg/dL — ABNORMAL HIGH (ref 70–99)
Potassium: 3.6 mEq/L (ref 3.5–5.1)
Sodium: 136 mEq/L (ref 135–145)

## 2014-02-19 MED ORDER — SITAGLIPTIN PHOSPHATE 100 MG PO TABS: 100.0000 mg | ORAL_TABLET | Freq: Every day | ORAL | Status: DC

## 2014-02-19 NOTE — Addendum Note (Signed)
Addended by: Agnes Lawrence on: 02/19/2014 11:14 AM   Modules accepted: Orders

## 2014-02-19 NOTE — Patient Instructions (Signed)
-  PLEASE GO TO CHECK LABS TODAY  -IF the diabetes lab is still too high - the plan will be to ADD a NEW MEDICATION called Januvia as we discussed today and at your last visit. You will continue to take the metformin also.  -We recommend the following healthy lifestyle measures: - eat a healthy diet consisting of lots of vegetables, fruits, beans, nuts, seeds, healthy meats such as white chicken and fish and whole grains.  - avoid fried foods, fast food, processed foods, sodas, red meet and other fattening foods.  - get a least 150 minutes of aerobic exercise per week.   Schedule an eye exam  Follow up in 3 months

## 2014-02-19 NOTE — Progress Notes (Signed)
Pre visit review using our clinic review tool, if applicable. No additional management support is needed unless otherwise documented below in the visit note. 

## 2014-02-19 NOTE — Progress Notes (Signed)
No chief complaint on file.   HPI:  S/p Fall: -mechanical 02/07/14 -reviewed ED notes: had CT heada and neck and plain films of knee and elbow - no sig findings -reports: recovering slowly -denies: weakness, numbness, HA  DM: -changed to Januvia last visit  - but she never got this medication, continues to take metformin -last eye exam: has been about 1 year -denies: polyuria, polydipsia, vision changes, hypoglycemia  HTN: -meds: amlodipine 10mg , she decided to restart lisinopril-hctz on her own due to swelling in legs and this resolved (the diuretic had been stopped due to hypokalemia) -denies: CP, SOB, DOE, palpitations  Urinary incontinence: -meds: oxybutynin, stable  OA of the knees: -followed by Dr. Maureen Ralphs - seeing him next month  RLS: -meds: mirapex  Hx of Breast Ca: -managed by Dr. Humphrey Rolls in [place] -meds: arimidex   ROS: See pertinent positives and negatives per HPI.  Past Medical History  Diagnosis Date  . Diabetes mellitus   . Hypertension   . Arthritis   . Hearing loss   . Cancer     right breast   . History of radiation therapy 04/29/10-05/05/10    right breast 34Gy/1fx  . Actinic keratosis 07/18/2008    Qualifier: Diagnosis of  By: Arnoldo Morale MD, Balinda Quails   . Closed fracture of one or more phalanges of foot 03/29/2008    Qualifier: Diagnosis of  By: Arnoldo Morale MD, Balinda Quails   . No blood products (Jehovah Witness)  09/11/2013  . RESTLESS LEGS SYNDROME 05/05/2007    Qualifier: Diagnosis of  By: Gwenette Greet MD, Armando Reichert   . Radiation 05/05/2010    Right Breast mammosite  . Sleep apnea     Past Surgical History  Procedure Laterality Date  . Flex signoidoscopy    . Incision and drainage perirectal abscess    . Appendectomy    . Tubal ligation  1979  . Knee surgery  2006    right knee   . Breast surgery      rt breast lumpectomy    Family History  Problem Relation Age of Onset  . Cancer Sister     breast  . Heart disease Paternal Grandfather   . Diabetes Other    . Obesity Other     History   Social History  . Marital Status: Married    Spouse Name: N/A    Number of Children: N/A  . Years of Education: N/A   Social History Main Topics  . Smoking status: Never Smoker   . Smokeless tobacco: None  . Alcohol Use: No  . Drug Use: No  . Sexual Activity: Yes   Other Topics Concern  . None   Social History Narrative   Work or School: at home      Home Situation: lives with husband      Spiritual Beliefs: Jehovah Witness - no blood products      Lifestyle: no regular exercise; diet poor             Current outpatient prescriptions:amLODipine (NORVASC) 10 MG tablet, Take 1 tablet (10 mg total) by mouth daily., Disp: 90 tablet, Rfl: 3;  anastrozole (ARIMIDEX) 1 MG tablet, TAKE ONE TABLET BY MOUTH ONCE DAILY, Disp: 30 tablet, Rfl: 0;  Cholecalciferol (VITAMIN D PO), Take by mouth daily., Disp: , Rfl: ;  glucose blood (ACCU-CHEK AVIVA) test strip, Test once per day, Disp: 100 each, Rfl: 3 lisinopril-hydrochlorothiazide (PRINZIDE,ZESTORETIC) 20-25 MG per tablet, Take 1 tablet by mouth daily., Disp: , Rfl: ;  metFORMIN (GLUCOPHAGE) 1000 MG tablet, 1000mg  (1 tablet) twice daily, Disp: 180 tablet, Rfl: 3;  oxybutynin (DITROPAN) 5 MG tablet, TAKE ONE TABLET BY MOUTH TWICE DAILY, Disp: 60 tablet, Rfl: 0;  pramipexole (MIRAPEX) 1 MG tablet, TAKE ONE TABLET BY MOUTH TWICE DAILY, Disp: 60 tablet, Rfl: 0  EXAM:  Filed Vitals:   02/19/14 1027  BP: 124/76  Pulse: 83  Temp: 97.9 F (36.6 C)    Body mass index is 35.44 kg/(m^2).  GENERAL: vitals reviewed and listed above, alert, oriented, appears well hydrated and in no acute distress  HEENT: bruising of face and forehead, conjunttiva clear, no obvious abnormalities on inspection of external nose and ears  NECK: no obvious masses on inspection  LUNGS: clear to auscultation bilaterally, no wheezes, rales or rhonchi, good air movement  CV: HRRR, no peripheral edema  MS: moves all extremities  without noticeable abnormality  PSYCH: pleasant and cooperative, no obvious depression or anxiety  ASSESSMENT AND PLAN:  Discussed the following assessment and plan:  Type 2 diabetes mellitus without complication - Plan: Basic metabolic panel, Hemoglobin A1C, Microalbumin/Creatinine Ratio, Urine -add januvia if not at goal -advised she schedule eye exam -flu shot today -not fasting so lipids next visit  Essential hypertension - Plan: Basic metabolic panel -check potassium to ensure tolerating hctz - continue if doing ok  URINARY FREQUENCY, CHRONIC Stable  OA/RLS (restless legs syndrome)/s/p FALL -discussed balance and fall prevention, use of cane or shopping cart when out -advised PT for strength and balance - she will consider and discuss with her orthopedic doctor  -Patient advised to return or notify a doctor immediately if symptoms worsen or persist or new concerns arise.  Patient Instructions  -PLEASE GO TO CHECK LABS TODAY  -IF the diabetes lab is still too high - the plan will be to ADD a NEW MEDICATION called Januvia as we discussed today and at your last visit. You will continue to take the metformin also.  -We recommend the following healthy lifestyle measures: - eat a healthy diet consisting of lots of vegetables, fruits, beans, nuts, seeds, healthy meats such as white chicken and fish and whole grains.  - avoid fried foods, fast food, processed foods, sodas, red meet and other fattening foods.  - get a least 150 minutes of aerobic exercise per week.   Schedule an eye exam  Follow up in 3 months     Vanessa Lesmeister R.

## 2014-02-20 MED ORDER — ACCU-CHEK SOFT TOUCH LANCETS MISC: Status: DC

## 2014-02-20 MED ORDER — GLUCOSE BLOOD VI STRP: ORAL_STRIP | Status: DC

## 2014-02-20 NOTE — Addendum Note (Signed)
Addended by: Agnes Lawrence on: 02/20/2014 02:20 PM   Modules accepted: Orders

## 2014-02-21 ENCOUNTER — Telehealth: Payer: Self-pay | Admitting: Family Medicine

## 2014-02-21 NOTE — Telephone Encounter (Signed)
Patient informed. 

## 2014-02-21 NOTE — Telephone Encounter (Signed)
Pt states she was put on sitaGLIPtin (JANUVIA) 100 MG tablet and she read that you need to let dr know if you have ever had gallstones.  Pt states she had one episode back in the 1990's. Should she even start to take this med? pls advise

## 2014-02-21 NOTE — Telephone Encounter (Signed)
This is not a contraindication to this medication unless she had pancreatitis. However, if she is worried about it we could try a different medication like glipizide, byetta or invokana.

## 2014-03-15 ENCOUNTER — Other Ambulatory Visit: Payer: Self-pay | Admitting: Family Medicine

## 2014-03-15 ENCOUNTER — Other Ambulatory Visit: Payer: Self-pay | Admitting: Adult Health

## 2014-03-15 DIAGNOSIS — C50911 Malignant neoplasm of unspecified site of right female breast: Secondary | ICD-10-CM

## 2014-03-19 ENCOUNTER — Other Ambulatory Visit: Payer: Self-pay | Admitting: Family Medicine

## 2014-04-02 ENCOUNTER — Encounter: Payer: Self-pay | Admitting: Family Medicine

## 2014-04-05 ENCOUNTER — Ambulatory Visit
Admission: RE | Admit: 2014-04-05 | Discharge: 2014-04-05 | Disposition: A | Discharge status: Home / Self Care | Payer: Commercial Managed Care - HMO | Source: Ambulatory Visit | Attending: Hematology and Oncology | Admitting: Hematology and Oncology

## 2014-04-05 DIAGNOSIS — C50919 Malignant neoplasm of unspecified site of unspecified female breast: Secondary | ICD-10-CM

## 2014-04-14 ENCOUNTER — Other Ambulatory Visit: Payer: Self-pay | Admitting: Family Medicine

## 2014-05-02 LAB — HM DIABETES EYE EXAM

## 2014-05-04 ENCOUNTER — Encounter: Payer: Self-pay | Admitting: Family Medicine

## 2014-05-15 ENCOUNTER — Other Ambulatory Visit: Payer: Self-pay | Admitting: *Deleted

## 2014-05-15 MED ORDER — PRAMIPEXOLE DIHYDROCHLORIDE 1 MG PO TABS: ORAL_TABLET | ORAL | Status: DC

## 2014-05-21 ENCOUNTER — Encounter: Payer: Self-pay | Admitting: Family Medicine

## 2014-05-21 ENCOUNTER — Ambulatory Visit (INDEPENDENT_AMBULATORY_CARE_PROVIDER_SITE_OTHER): Payer: Commercial Managed Care - HMO | Admitting: Family Medicine

## 2014-05-21 VITALS — BP 122/64 | HR 98 | Temp 97.9°F | Ht 62.5 in | Wt 194.6 lb

## 2014-05-21 DIAGNOSIS — L0390 Cellulitis, unspecified: Secondary | ICD-10-CM

## 2014-05-21 DIAGNOSIS — B372 Candidiasis of skin and nail: Secondary | ICD-10-CM

## 2014-05-21 DIAGNOSIS — E119 Type 2 diabetes mellitus without complications: Secondary | ICD-10-CM

## 2014-05-21 MED ORDER — DOXYCYCLINE HYCLATE 100 MG PO TABS: 100.0000 mg | ORAL_TABLET | Freq: Two times a day (BID) | ORAL | Status: DC

## 2014-05-21 MED ORDER — FLUCONAZOLE 150 MG PO TABS: 150.0000 mg | ORAL_TABLET | Freq: Once | ORAL | Status: DC

## 2014-05-21 NOTE — Progress Notes (Signed)
HPI:  Acute visit for:  1) Rash: -started: about 2 weeks ago - but does get this rash under breasts on and off -location: under both breasts, inguinal region and belly button -symptoms: itchy rash, belly button a little crusty -denies: fevers, malaise, chills -hx of: yeast infection  ROS: See pertinent positives and negatives per HPI.  Past Medical History  Diagnosis Date  . Diabetes mellitus   . Hypertension   . Arthritis   . Hearing loss   . Cancer     right breast   . History of radiation therapy 04/29/10-05/05/10    right breast 34Gy/72fx  . Actinic keratosis 07/18/2008    Qualifier: Diagnosis of  By: Arnoldo Morale MD, Balinda Quails   . Closed fracture of one or more phalanges of foot 03/29/2008    Qualifier: Diagnosis of  By: Arnoldo Morale MD, Balinda Quails   . No blood products (Jehovah Witness)  09/11/2013  . RESTLESS LEGS SYNDROME 05/05/2007    Qualifier: Diagnosis of  By: Gwenette Greet MD, Armando Reichert   . Radiation 05/05/2010    Right Breast mammosite  . Sleep apnea     Past Surgical History  Procedure Laterality Date  . Flex signoidoscopy    . Incision and drainage perirectal abscess    . Appendectomy    . Tubal ligation  1979  . Knee surgery  2006    right knee   . Breast surgery      rt breast lumpectomy    Family History  Problem Relation Age of Onset  . Cancer Sister     breast  . Heart disease Paternal Grandfather   . Diabetes Other   . Obesity Other     History   Social History  . Marital Status: Married    Spouse Name: N/A    Number of Children: N/A  . Years of Education: N/A   Social History Main Topics  . Smoking status: Never Smoker   . Smokeless tobacco: None  . Alcohol Use: No  . Drug Use: No  . Sexual Activity: Yes   Other Topics Concern  . None   Social History Narrative   Work or School: at home      Home Situation: lives with husband      Spiritual Beliefs: Jehovah Witness - no blood products      Lifestyle: no regular exercise; diet poor              Current outpatient prescriptions: amLODipine (NORVASC) 10 MG tablet, Take 1 tablet (10 mg total) by mouth daily., Disp: 90 tablet, Rfl: 3;  anastrozole (ARIMIDEX) 1 MG tablet, TAKE ONE TABLET BY MOUTH ONCE DAILY, Disp: 30 tablet, Rfl: 4;  Cholecalciferol (VITAMIN D PO), Take by mouth daily., Disp: , Rfl: ;  glucose blood (ACCU-CHEK AVIVA) test strip, Test once per day, Disp: 100 each, Rfl: 1 JANUVIA 100 MG tablet, TAKE ONE TABLET BY MOUTH ONCE DAILY, Disp: 30 tablet, Rfl: 1;  Lancets (ACCU-CHEK SOFT TOUCH) lancets, Use as instructed to check blood sugar, Disp: 100 each, Rfl: 1;  lisinopril-hydrochlorothiazide (PRINZIDE,ZESTORETIC) 20-25 MG per tablet, Take 1 tablet by mouth daily., Disp: , Rfl: ;  metFORMIN (GLUCOPHAGE) 1000 MG tablet, 1000mg  (1 tablet) twice daily, Disp: 180 tablet, Rfl: 3 oxybutynin (DITROPAN) 5 MG tablet, TAKE ONE TABLET BY MOUTH TWICE DAILY, Disp: 60 tablet, Rfl: 1;  pramipexole (MIRAPEX) 1 MG tablet, TAKE ONE TABLET BY MOUTH TWICE DAILY, Disp: 60 tablet, Rfl: 2  EXAM:  Filed Vitals:   05/21/14  1626  BP: 122/64  Pulse: 98  Temp: 97.9 F (36.6 C)    Body mass index is 35 kg/(m^2).  GENERAL: vitals reviewed and listed above, alert, oriented, appears well hydrated and in no acute distress  HEENT: atraumatic, conjunttiva clear, no obvious abnormalities on inspection of external nose and ears  NECK: no obvious masses on inspection  LUNGS: clear to auscultation bilaterally, no wheezes, rales or rhonchi, good air movement  CV: HRRR, no peripheral edema  SKIN: mildy macerated erythematous clearly demarcated patches of skin with scattered papules in both inframammary fold, inguinal region and umbilicus with some erythema/induration and crusting at umbilicus  MS: moves all extremities without noticeable abnormality  PSYCH: pleasant and cooperative, no obvious depression or anxiety  ASSESSMENT AND PLAN:  Discussed the following assessment and plan:  Type 2  diabetes mellitus without complication  Intertriginous candidiasis  Cellulitis, unspecified cellulitis site, unspecified extremity site, unspecified laterality  -candidal skin infection with likely secondary bacterial infection at umbilicus -diflucan for 3 days then clotrimazole cream for yeast, doxy for bacterial skin infection, check [id number] -follow up 1 week -Patient advised to return or notify a doctor immediately if symptoms worsen or persist or new concerns arise.  There are no Patient Instructions on file for this visit.   Colin Benton R.

## 2014-05-21 NOTE — Progress Notes (Signed)
Pre visit review using our clinic review tool, if applicable. No additional management support is needed unless otherwise documented below in the visit note. 

## 2014-05-21 NOTE — Patient Instructions (Addendum)
Before you leave: -labs -schedule follow up in 1 week  Diflucan daily for 3 days then over the counter clotrimazole cream to all areas twice daily  Take the antibiotic as instructed twice daily  Probiotic from [facility] daily (this is over the counter)

## 2014-05-25 ENCOUNTER — Ambulatory Visit: Payer: Commercial Managed Care - HMO | Admitting: Family Medicine

## 2014-05-28 ENCOUNTER — Ambulatory Visit (INDEPENDENT_AMBULATORY_CARE_PROVIDER_SITE_OTHER): Payer: Commercial Managed Care - HMO | Admitting: Family Medicine

## 2014-05-28 ENCOUNTER — Encounter: Payer: Self-pay | Admitting: Family Medicine

## 2014-05-28 VITALS — BP 102/80 | HR 80 | Temp 97.3°F | Ht 62.5 in | Wt 193.0 lb

## 2014-05-28 DIAGNOSIS — E119 Type 2 diabetes mellitus without complications: Secondary | ICD-10-CM | POA: Diagnosis not present

## 2014-05-28 DIAGNOSIS — I10 Essential (primary) hypertension: Secondary | ICD-10-CM | POA: Diagnosis not present

## 2014-05-28 DIAGNOSIS — R21 Rash and other nonspecific skin eruption: Secondary | ICD-10-CM

## 2014-05-28 DIAGNOSIS — E663 Overweight: Secondary | ICD-10-CM

## 2014-05-28 LAB — LIPID PANEL
CHOL/HDL RATIO: 4
Cholesterol: 152 mg/dL (ref 0–200)
HDL: 36.1 mg/dL — ABNORMAL LOW (ref >39.00)
LDL Cholesterol: 100 mg/dL — ABNORMAL HIGH (ref 0–99)
NonHDL: 115.9
TRIGLYCERIDES: 81 mg/dL (ref 0.0–149.0)
VLDL: 16.2 mg/dL (ref 0.0–40.0)

## 2014-05-28 LAB — BASIC METABOLIC PANEL
BUN: 14 mg/dL (ref 6–23)
CO2: 29 meq/L (ref 19–32)
Calcium: 9.4 mg/dL (ref 8.4–10.5)
Chloride: 96 mEq/L (ref 96–112)
Creatinine, Ser: 0.5 mg/dL (ref 0.4–1.2)
GFR: 122.12 mL/min (ref >60.00)
GLUCOSE: 144 mg/dL — AB (ref 70–99)
POTASSIUM: 3.8 meq/L (ref 3.5–5.1)
Sodium: 136 mEq/L (ref 135–145)

## 2014-05-28 LAB — HEMOGLOBIN A1C: Hgb A1c MFr Bld: 6.8 % — ABNORMAL HIGH (ref 4.6–6.5)

## 2014-05-28 MED ORDER — SITAGLIPTIN PHOSPHATE 100 MG PO TABS: 100.0000 mg | ORAL_TABLET | Freq: Every day | ORAL | Status: DC

## 2014-05-28 NOTE — Patient Instructions (Addendum)
BEFORE YOU LEAVE: -labs -schedule preop visit in next 1 month  Stop eating sugar and carbs  Start getting regular exercise - stationary bike  Continue the yeast cream twice dialy for 1-2 more weeks - wear loose cotton clothing  -eye ecam yeatly  We recommend the following healthy lifestyle measures: - eat a healthy diet consisting of lots of vegetables, fruits, beans, nuts, seeds, healthy meats such as white chicken and fish and whole grains.  - avoid fried foods, fast food, processed foods, sodas, red meet and other fattening foods.  - get a least 150 minutes of aerobic exercise per week.

## 2014-05-28 NOTE — Progress Notes (Signed)
Pre visit review using our clinic review tool, if applicable. No additional management support is needed unless otherwise documented below in the visit note. 

## 2014-05-28 NOTE — Progress Notes (Signed)
HPI:  RASH -see prior notes -reports: went away and then recurred midly - reports much better and she is using the topical cream -denies: swelling, erythema, fevers  DM: -changed to [place] 02/2014 -continues to take metformin -last eye exam: has been about 1 year -diet poor, no exercise -denies: polyuria, polydipsia, vision changes, hypoglycemia  HTN: -meds: amlodipine 10mg , she decided to restart lisinopril-hctz on her own due to swelling in legs and this resolved (the diuretic had been stopped due to hypokalemia) -denies: CP, SOB, DOE, palpitations  Urinary incontinence: -meds: oxybutynin, stable  OA of the knees: -followed by Dr. Maureen Ralphs - seeing him next month  RLS: -meds: mirapex  Hx of Breast Ca: -managed by Dr. Humphrey Rolls in [place] -meds: arimidex   ROS: See pertinent positives and negatives per HPI.  Past Medical History  Diagnosis Date  . Diabetes mellitus   . Hypertension   . Arthritis   . Hearing loss   . Cancer     right breast   . History of radiation therapy 04/29/10-05/05/10    right breast 34Gy/63fx  . Actinic keratosis 07/18/2008    Qualifier: Diagnosis of  By: Arnoldo Morale MD, Balinda Quails   . Closed fracture of one or more phalanges of foot 03/29/2008    Qualifier: Diagnosis of  By: Arnoldo Morale MD, Balinda Quails   . No blood products (Jehovah Witness)  09/11/2013  . RESTLESS LEGS SYNDROME 05/05/2007    Qualifier: Diagnosis of  By: Gwenette Greet MD, Armando Reichert   . Radiation 05/05/2010    Right Breast mammosite  . Sleep apnea     Past Surgical History  Procedure Laterality Date  . Flex signoidoscopy    . Incision and drainage perirectal abscess    . Appendectomy    . Tubal ligation  1979  . Knee surgery  2006    right knee   . Breast surgery      rt breast lumpectomy    Family History  Problem Relation Age of Onset  . Cancer Sister     breast  . Heart disease Paternal Grandfather   . Diabetes Other   . Obesity Other     History   Social History  . Marital  Status: Married    Spouse Name: N/A    Number of Children: N/A  . Years of Education: N/A   Social History Main Topics  . Smoking status: Never Smoker   . Smokeless tobacco: None  . Alcohol Use: No  . Drug Use: No  . Sexual Activity: Yes   Other Topics Concern  . None   Social History Narrative   Work or School: at home      Home Situation: lives with husband      Spiritual Beliefs: Jehovah Witness - no blood products      Lifestyle: no regular exercise; diet poor             Current outpatient prescriptions: amLODipine (NORVASC) 10 MG tablet, Take 1 tablet (10 mg total) by mouth daily., Disp: 90 tablet, Rfl: 3;  anastrozole (ARIMIDEX) 1 MG tablet, TAKE ONE TABLET BY MOUTH ONCE DAILY, Disp: 30 tablet, Rfl: 4;  Cholecalciferol (VITAMIN D PO), Take by mouth daily., Disp: , Rfl: ;  doxycycline (VIBRA-TABS) 100 MG tablet, Take 1 tablet (100 mg total) by mouth 2 (two) times daily., Disp: 20 tablet, Rfl: 0 fluconazole (DIFLUCAN) 150 MG tablet, Take 1 tablet (150 mg total) by mouth once., Disp: 3 tablet, Rfl: 0;  glucose blood (ACCU-CHEK AVIVA)  test strip, Test once per day, Disp: 100 each, Rfl: 1;  Lancets (ACCU-CHEK SOFT TOUCH) lancets, Use as instructed to check blood sugar, Disp: 100 each, Rfl: 1;  lisinopril-hydrochlorothiazide (PRINZIDE,ZESTORETIC) 20-25 MG per tablet, Take 1 tablet by mouth daily., Disp: , Rfl:  metFORMIN (GLUCOPHAGE) 1000 MG tablet, 1000mg  (1 tablet) twice daily, Disp: 180 tablet, Rfl: 3;  oxybutynin (DITROPAN) 5 MG tablet, TAKE ONE TABLET BY MOUTH TWICE DAILY, Disp: 60 tablet, Rfl: 1;  pramipexole (MIRAPEX) 1 MG tablet, TAKE ONE TABLET BY MOUTH TWICE DAILY, Disp: 60 tablet, Rfl: 2;  sitaGLIPtin (JANUVIA) 100 MG tablet, Take 1 tablet (100 mg total) by mouth daily., Disp: 30 tablet, Rfl: 1  EXAM:  Filed Vitals:   05/28/14 1021  BP: 102/80  Pulse: 80  Temp: 97.3 F (36.3 C)    Body mass index is 34.72 kg/(m^2).  GENERAL: vitals reviewed and listed above,  alert, oriented, appears well hydrated and in no acute distress  HEENT: atraumatic, conjunttiva clear, no obvious abnormalities on inspection of external nose and ears  NECK: no obvious masses on inspection  LUNGS: clear to auscultation bilaterally, no wheezes, rales or rhonchi, good air movement  CV: HRRR, no peripheral edema  SKIN: small patch sharply demarcated erythematous skin R inframammary fold, mild hyperpigmentation of skin in other areas that appears more post infammatory  MS: moves all extremities without noticeable abnormality  PSYCH: pleasant and cooperative, no obvious depression or anxiety  ASSESSMENT AND PLAN:  Discussed the following assessment and plan:  Type 2 diabetes mellitus without complication - Plan: Hemoglobin A1c, Lipid Panel  Overweight  Essential hypertension - Plan: Basic metabolic panel  Rash and nonspecific skin eruption  -check FASTING hgba1c and bmp and lipids  -she has knee surgery coming up in March - advised she really should get her diabetes under control and get in shape prior to surgery and advised needs pre op visit  -intertrigo much improved and suspect her poor diet lately has caused elevated sugars and is likely contributing - advised referral to diabetes educator but she refused and reports she knows what tod do but has"been bad"  -Patient advised to return or notify a doctor immediately if symptoms worsen or persist or new concerns arise.  Patient Instructions  BEFORE YOU LEAVE: -labs -schedule preop visit in next 1 month  Stop eating sugar and carbs  Start getting regular exercise - stationary bike  Continue the yeast cream twice dialy for 1-2 more weeks - wear loose cotton clothing       KIM, HANNAH R.

## 2014-06-13 ENCOUNTER — Other Ambulatory Visit: Payer: Self-pay

## 2014-06-13 NOTE — Telephone Encounter (Signed)
Rx request for Oxybutynin 5 mg tablet- Take 1 tablet by mouth twice daily #60  Pharm:  [facility]   Pls advise.

## 2014-06-14 MED ORDER — OXYBUTYNIN CHLORIDE 5 MG PO TABS: 5.0000 mg | ORAL_TABLET | Freq: Two times a day (BID) | ORAL | Status: DC

## 2014-06-28 ENCOUNTER — Encounter: Payer: Self-pay | Admitting: *Deleted

## 2014-06-28 ENCOUNTER — Ambulatory Visit (INDEPENDENT_AMBULATORY_CARE_PROVIDER_SITE_OTHER): Payer: Commercial Managed Care - HMO | Admitting: Family Medicine

## 2014-06-28 ENCOUNTER — Ambulatory Visit: Payer: Commercial Managed Care - HMO | Admitting: Family Medicine

## 2014-06-28 ENCOUNTER — Encounter: Payer: Self-pay | Admitting: Family Medicine

## 2014-06-28 ENCOUNTER — Telehealth: Payer: Self-pay | Admitting: Family Medicine

## 2014-06-28 VITALS — BP 128/72 | HR 70 | Temp 97.8°F | Ht 62.5 in | Wt 195.7 lb

## 2014-06-28 DIAGNOSIS — R9431 Abnormal electrocardiogram [ECG] [EKG]: Secondary | ICD-10-CM

## 2014-06-28 DIAGNOSIS — E119 Type 2 diabetes mellitus without complications: Secondary | ICD-10-CM | POA: Diagnosis not present

## 2014-06-28 DIAGNOSIS — I10 Essential (primary) hypertension: Secondary | ICD-10-CM | POA: Diagnosis not present

## 2014-06-28 DIAGNOSIS — Z01818 Encounter for other preprocedural examination: Secondary | ICD-10-CM | POA: Diagnosis not present

## 2014-06-28 NOTE — Telephone Encounter (Addendum)
Is this her surgery office? Ok to send OV note from today to Psychologist, sport and exercise. Thanks. If not surgeon - please find out whom is requesting information and let me know. Thanks.

## 2014-06-28 NOTE — Progress Notes (Addendum)
No chief complaint on file.   HPI:  Patient is seen for optimization of general medical care prior to surgery. She has a PMH sig for DM (well controlled), HLD (well controlled), HTN (well controlled), hx breast ca  Surgery type: L knee arthroplasty  Date of surgery: 08/06/14  Kidney disease? No Prior surgeries/Issues following anesthesia? Has had numerous surgeries listed below wihtout any complications - last surgery 4 years ago, reports had eval with cards due to abnormal EKG 10 years ago and all normal Hx MI, heart arrythmia, CHF, angina or stroke? none Epilepsy or Seizures? none Arthritis or problems with neck or jaw? none Thyroid disease? none Liver disease? none Asthma, COPD or chronic lung disease? none Diabetes? Yes well controlled  Other: Poor nutrition, Frail or other: no  METS:  ?Can do heavy work around the house such as scrubbing floors or lifting or moving heavy furniture (between 4 and 10 METs). Yes - only limited by the pain in her knees ?Can participate in strenuous sports such as swimming, singles tennis, football, basketball, and skiing (>10 METs) No- due to knee pain . AHA Risks: Major predictors that require intensive management and may lead to delay in or cancellation of the operative procedure unless emergent: NONE   Other clinical predictors that warrant careful assessment of current status:  . Diabetes mellitus    Type of surgery and Risk: Intermediate risk (reported risk of cardiac death or nonfatal MI generally 1 to 5 percent): Marland Kitchen Orthopedic surgery    Medications that need to be addressed prior to surgery:  Discontinue acei/arbs/non-statin lipid lowering drugs day of surgery ASA stop 7 days before or discuss with cardiology if CV risks, other anticoagulants discuss with cardiology Arimidex, blood sugar medications  ROS: See pertinent positives and negatives per HPI. 11 point ROS negative except where noted. Specifically she denies any hyper or  hypoglycemia, vision changes, HA, SOB, DOE, palpitations, fevers, skin infection, CP or any other symptoms on review.  Past Medical History  Diagnosis Date  . Diabetes mellitus   . Hypertension   . Arthritis   . Hearing loss   . Cancer     right breast   . History of radiation therapy 04/29/10-05/05/10    right breast 34Gy/50fx  . Actinic keratosis 07/18/2008    Qualifier: Diagnosis of  By: Arnoldo Morale MD, Balinda Quails   . Closed fracture of one or more phalanges of foot 03/29/2008    Qualifier: Diagnosis of  By: Arnoldo Morale MD, Balinda Quails   . No blood products (Jehovah Witness)  09/11/2013  . RESTLESS LEGS SYNDROME 05/05/2007    Qualifier: Diagnosis of  By: Gwenette Greet MD, Armando Reichert   . Radiation 05/05/2010    Right Breast mammosite  . Sleep apnea     Past Surgical History  Procedure Laterality Date  . Flex signoidoscopy    . Incision and drainage perirectal abscess    . Appendectomy    . Tubal ligation  1979  . Knee surgery  2006    right knee   . Breast surgery      rt breast lumpectomy    Family History  Problem Relation Age of Onset  . Cancer Sister     breast  . Heart disease Paternal Grandfather   . Diabetes Other   . Obesity Other     History   Social History  . Marital Status: Married    Spouse Name: N/A  . Number of Children: N/A  . Years of Education: N/A  Social History Main Topics  . Smoking status: Never Smoker   . Smokeless tobacco: Not on file  . Alcohol Use: No  . Drug Use: No  . Sexual Activity: Yes   Other Topics Concern  . None   Social History Narrative   Work or School: at home      Home Situation: lives with husband      Spiritual Beliefs: Jehovah Witness - no blood products      Lifestyle: no regular exercise; diet poor              Current outpatient prescriptions:  .  amLODipine (NORVASC) 10 MG tablet, Take 1 tablet (10 mg total) by mouth daily., Disp: 90 tablet, Rfl: 3 .  anastrozole (ARIMIDEX) 1 MG tablet, TAKE ONE TABLET BY MOUTH ONCE  DAILY, Disp: 30 tablet, Rfl: 4 .  Cholecalciferol (VITAMIN D PO), Take by mouth daily., Disp: , Rfl:  .  doxycycline (VIBRA-TABS) 100 MG tablet, Take 1 tablet (100 mg total) by mouth 2 (two) times daily., Disp: 20 tablet, Rfl: 0 .  glucose blood (ACCU-CHEK AVIVA) test strip, Test once per day, Disp: 100 each, Rfl: 1 .  Lancets (ACCU-CHEK SOFT TOUCH) lancets, Use as instructed to check blood sugar, Disp: 100 each, Rfl: 1 .  lisinopril-hydrochlorothiazide (PRINZIDE,ZESTORETIC) 20-25 MG per tablet, Take 1 tablet by mouth daily., Disp: , Rfl:  .  metFORMIN (GLUCOPHAGE) 1000 MG tablet, 1000mg  (1 tablet) twice daily, Disp: 180 tablet, Rfl: 3 .  oxybutynin (DITROPAN) 5 MG tablet, Take 1 tablet (5 mg total) by mouth 2 (two) times daily., Disp: 60 tablet, Rfl: 1 .  pramipexole (MIRAPEX) 1 MG tablet, TAKE ONE TABLET BY MOUTH TWICE DAILY, Disp: 60 tablet, Rfl: 2 .  sitaGLIPtin (JANUVIA) 100 MG tablet, Take 1 tablet (100 mg total) by mouth daily., Disp: 30 tablet, Rfl: 1  EXAM:  Filed Vitals:   06/28/14 1015  BP: 128/72  Pulse: 70  Temp: 97.8 F (36.6 C)    Body mass index is 35.2 kg/(m^2).  GENERAL: vitals reviewed and listed above, alert, oriented, appears well hydrated and in no acute distress  HEENT: atraumatic, conjunttiva clear, no obvious abnormalities on inspection of external nose and ears  NECK: no obvious masses on inspection, no carotid bruits  LUNGS: clear to auscultation bilaterally, no wheezes, rales or rhonchi, good air movement  CV: HRRR, no peripheral edema, no JVD, BP normal range, normal radial pulses  MS: moves all extremities without noticeable abnormality  PSYCH: pleasant and cooperative, no obvious depression or anxiety  ASSESSMENT AND PLAN:  Discussed the following assessment and plan:  Pre-op evaluation - Plan: EKG 12-Lead, Ambulatory referral to Cardiology  Abnormal EKG - Plan: Ambulatory referral to Cardiology  Essential hypertension - Plan: Ambulatory  referral to Cardiology  Type 2 diabetes mellitus without complication - Plan: Ambulatory referral to Cardiology  Assessment: -Risk factors: age, diabetes - well controlled, HTN - well controlled, abnormal EKG -Surgery Risks:intermediate -age, nutritional status, fraility: good nutritional status, age >23, no fraility -functional capacity: > 4 METs without symptoms -comorbidities: diabetes, obesity, HTN    Recommendations for optimizing general medical care prior to surgery: -recent labs ok -EKG with tw changes in precordial leads, poor R wave progression - discussed with pt, asymptomatic but opted for cardiology pre-op eval given this, age and other comorbidities and referral placed -advised patient to discuss specific risks morbidity and mortality of surgery with surgeon, CV risks discussed and should discuss further with cardiologist -advised patient will  defer to surgeon for post-op DVT prophylaxis and post op care -Per her oncologist, should "stop antiestrogen therapy for one month before and after to decrease the risk of any blood clots." Discontinue acei/arbs/non-statin lipid lowering drugs day of surgery (lisinopril-hydrochlorthiazid[prinzide, zestoretic]) -Follow surgeon recs regarding blood sugar control during NPO status and other medication recommendations -advised that anesthesiologist is aware of her diabetes prior to surgery and that they evaluate her prior to surgery if they feel this is needed   -Patient advised to return or notify a doctor immediately if symptoms worsen or persist or new concerns arise.  Patient Instructions  To Whom it may concern:  The following patient, [name], DOB: 1940/07/18, was seen in our office on the following date, @TODAY @, for optimization of medical care prior to surgery. Please see the attached office visit note and below for the recommendations we advised.    Sincerely,    Colin Benton, DO      Ashanna Heinsohn, [place]

## 2014-06-28 NOTE — Patient Instructions (Addendum)
Before you leave: -letter with recs below - to patient and copy faxed to surgeon with OV note -ensure has follow up in 3 months  To Whom it may concern:  The following patient, [name], DOB: [birth year], was seen in our office on the following date, 06/28/2014, for optimization of medical care prior to surgery. Please see the attached office visit note and below for the recommendations we advised.  Assessment: -Risk factors: age, diabetes - well controlled, HTN - well controlled, abnormal EKG -Surgery Risks:intermediate -age, nutritional status, fraility: good nutritional status, age >42, no fraility -functional capacity: > 4 METs without symptoms -comorbidities: diabetes, obesity, HTN  Recommendations for optimizing general medical care prior to surgery: -recent labs ok -advised EKG with changes in precordial leads and poor R wave progression - discussed with patient, asymptomatic - advised cardiology pre-op eval given this, age and other comorbidities and referral placed -advised patient to discuss specific risks morbidity and mortality of surgery with surgeon, cardiovascular risks discussed and should discuss further with cardiologist -advised patient will defer to surgeon for post-op deep vein thrombosis prophylaxis and post op care -advised per her oncologist, should "stop antiestrogen therapy for one month before and after to decrease the risk of any blood clots." -advised to discontinue acei/arbs/non-statin lipid lowering drugs day of surgery (lisinopril-hydrochlorthiazid[prinzide, zestoretic]) -advised to follow surgeon recs regarding blood sugar control during NPO status and other medication recommendations -advised that anesthesiologist is aware of her diabetes prior to surgery and that they evaluate her prior to surgery if they feel this is needed -advised my assistant to fax pre-op evaluation letter with this information to the surgeon

## 2014-06-28 NOTE — Telephone Encounter (Signed)
Dr Kim-is it OK to send the last office visit notes?

## 2014-06-28 NOTE — Telephone Encounter (Signed)
[employer] need a face to face sent to them. Fax # is [phone]

## 2014-06-28 NOTE — Progress Notes (Signed)
Pre visit review using our clinic review tool, if applicable. No additional management support is needed unless otherwise documented below in the visit note. 

## 2014-07-05 ENCOUNTER — Ambulatory Visit: Payer: Self-pay | Admitting: Orthopedic Surgery

## 2014-07-05 NOTE — Progress Notes (Signed)
Preoperative surgical orders have been place into the Epic hospital system for [name] on 07/05/2014, 12:49 PM  by Mickel Crow for surgery on 08-06-2014.  Preop Total Knee orders including Experal, IV Tylenol, and IV Decadron as long as there are no contraindications to the above medications. Arlee Muslim, PA-C

## 2014-07-06 ENCOUNTER — Encounter: Payer: Self-pay | Admitting: Cardiology

## 2014-07-06 ENCOUNTER — Ambulatory Visit (INDEPENDENT_AMBULATORY_CARE_PROVIDER_SITE_OTHER): Payer: Commercial Managed Care - HMO | Admitting: Cardiology

## 2014-07-06 VITALS — BP 128/68 | HR 78 | Ht 62.5 in | Wt 194.7 lb

## 2014-07-06 DIAGNOSIS — R9431 Abnormal electrocardiogram [ECG] [EKG]: Secondary | ICD-10-CM | POA: Diagnosis not present

## 2014-07-06 NOTE — Progress Notes (Signed)
Cardiology Office Note   Date:  07/06/2014   ID:  [name], DOB 1941-02-04, MRN [id number]  PCP:  [name]., DO  Cardiologist:   Minus Breeding, MD   Chief Complaint  Patient presents with  . Pre-op Exam      History of Present Illness: [name] is a 74 y.o. female who presents for the patient has no prior cardiac history and she is due to have left knee replacement. 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 He is here prior to her previous knee surgery. I was able to find a stress perfusion study from 2007 that showed some apical defect thought to be artifact versus mild ischemia. More recently she had an echocardiogram in 2011 with stress. This demonstrated no wall motion abnormalities. She had a normal ejection fraction. She otherwise does well. She is very limited by her knee pain and walks with a walker or cane. However, she can go to the grocery store and do her household chores. With this she denies any chest pressure, or arm discomfort. She has no palpitations, presyncope or syncope. She has no PND or orthopnea. She has no weight gain or edema.    Past Medical History  Diagnosis Date  . Diabetes mellitus   . Hypertension   . Arthritis   . Hearing loss   . Cancer     right breast   . History of radiation therapy 04/29/10-05/05/10    right breast 34Gy/53fx  . Actinic keratosis 07/18/2008    Qualifier: Diagnosis of  By: Arnoldo Morale MD, Balinda Quails   . Closed fracture of one or more phalanges of foot 03/29/2008    Qualifier: Diagnosis of  By: Arnoldo Morale MD, Balinda Quails   . No blood products (Jehovah Witness)  09/11/2013  . RESTLESS LEGS SYNDROME 05/05/2007    Qualifier: Diagnosis of  By: Gwenette Greet MD, Armando Reichert   . Radiation 05/05/2010    Right Breast mammosite  . Sleep apnea     Past Surgical History  Procedure Laterality Date  . Flex signoidoscopy    . Incision and drainage perirectal abscess    . Appendectomy    . Tubal ligation   1979  . Knee surgery  2006    right knee   . Breast surgery      rt breast lumpectomy     Current Outpatient Prescriptions  Medication Sig Dispense Refill  . amLODipine (NORVASC) 10 MG tablet Take 1 tablet (10 mg total) by mouth daily. 90 tablet 3  . anastrozole (ARIMIDEX) 1 MG tablet TAKE ONE TABLET BY MOUTH ONCE DAILY 30 tablet 4  . Cholecalciferol (VITAMIN D PO) Take by mouth daily.    Marland Kitchen glucose blood (ACCU-CHEK AVIVA) test strip Test once per day 100 each 1  . Lancets (ACCU-CHEK SOFT TOUCH) lancets Use as instructed to check blood sugar 100 each 1  . lisinopril-hydrochlorothiazide (PRINZIDE,ZESTORETIC) 20-25 MG per tablet Take 1 tablet by mouth daily.    . metFORMIN (GLUCOPHAGE) 1000 MG tablet 1000mg  (1 tablet) twice daily 180 tablet 3  . oxybutynin (DITROPAN) 5 MG tablet Take 1 tablet (5 mg total) by mouth 2 (two) times daily. 60 tablet 1  . pramipexole (MIRAPEX) 1 MG tablet TAKE ONE TABLET BY MOUTH TWICE DAILY 60 tablet 2  . sitaGLIPtin (JANUVIA) 100 MG tablet Take 1 tablet (100 mg total) by mouth daily. 30 tablet 1   No current facility-administered medications for this visit.  Allergies:   Sulfonamide derivatives    Social History:  The patient  reports that she has never smoked. She does not have any smokeless tobacco history on file. She reports that she does not drink alcohol or use illicit drugs.   Family History:  The patient's family history includes Cancer in her sister; Diabetes in her other; Heart disease in her paternal grandfather; Obesity in her other.    ROS:  Please see the history of present illness.   Otherwise, review of systems are positive for none.   All other systems are reviewed and negative.    PHYSICAL EXAM: VS:  BP 128/68 mmHg  Pulse 78  Ht 5' 2.5" (1.588 m)  Wt 194 lb 11.2 oz (88.315 kg)  BMI 35.02 kg/m2 , BMI Body mass index is 35.02 kg/(m^2). GENERAL:  Well appearing HEENT:  Pupils equal round and reactive, fundi not visualized, oral  mucosa unremarkable NECK:  No jugular venous distention, waveform within normal limits, carotid upstroke brisk and symmetric, no bruits, no thyromegaly LYMPHATICS:  No cervical, inguinal adenopathy LUNGS:  Clear to auscultation bilaterally BACK:  No CVA tenderness CHEST:  Unremarkable HEART:  PMI not displaced or sustained,S1 and S2 within normal limits, no S3, no S4, no clicks, no rubs, no murmurs ABD:  Flat, positive bowel sounds normal in frequency in pitch, no bruits, no rebound, no guarding, no midline pulsatile mass, no hepatomegaly, no splenomegaly EXT:  2 plus pulses throughout, no edema, no cyanosis no clubbing SKIN:  No rashes no nodules NEURO:  Cranial nerves II through XII grossly intact, motor grossly intact throughout PSYCH:  Cognitively intact, oriented to person place and time    EKG:  EKG is ordered today. 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   Recent Labs: 01/25/2014: ALT 18; Hemoglobin 15.5; Platelets 271 05/28/2014: BUN 14; Creatinine 0.5; Potassium 3.8; Sodium 136    Lipid Panel    Component Value Date/Time   CHOL 152 05/28/2014 1057   TRIG 81.0 05/28/2014 1057   HDL 36.10* 05/28/2014 1057   CHOLHDL 4 05/28/2014 1057   VLDL 16.2 05/28/2014 1057   LDLCALC 100* 05/28/2014 1057   LDLDIRECT 111.6 02/14/2010 1406      Wt Readings from Last 3 Encounters:  07/06/14 194 lb 11.2 oz (88.315 kg)  06/28/14 195 lb 11.2 oz (88.769 kg)  05/28/14 193 lb (87.544 kg)      Other studies Reviewed: Additional studies/ records that were reviewed today include:  Echo, Lexiscan Myoview.  Stress echo. Review of the above records demonstrates:  Please see elsewhere in the note.  See elsewhere   ASSESSMENT AND PLAN:  PREOP:  I have looked through extensive records. This includes scouring 4 in finding an old EKG from 2011 which demonstrated anterior Q waves. However,  she's had 2 workups that demonstrated no prior anterior infarct. She had a negative stress test in 2011. She has a reasonable functional level. She has no symptoms. She is not going for a high risk procedure. Therefore, based on current guidelines no further cardiovascular testing is suggested. She will be at acceptable risk for the planned procedure.  DM:  I do note that her last hemoglobin A1c was 6.8.  HTN:  The blood pressure is at target. No change in medications is indicated. We will continue with therapeutic lifestyle changes (TLC).  ABNORMAL EKG:  As above.    Current medicines are reviewed at length  with the patient today.  The patient does not have concerns regarding medicines.  The following changes have been made:  no change  Labs/ tests ordered today include: none  No orders of the defined types were placed in this encounter.     Disposition:   FU with me as needed.      Signed, Minus Breeding, MD  07/06/2014 12:22 PM    [facility]

## 2014-07-06 NOTE — Patient Instructions (Signed)
Your physician recommends that you schedule a follow-up appointment in: one year with Dr. Hochrein  

## 2014-07-25 ENCOUNTER — Other Ambulatory Visit: Payer: Self-pay | Admitting: *Deleted

## 2014-07-25 MED ORDER — AMLODIPINE BESYLATE 10 MG PO TABS: 10.0000 mg | ORAL_TABLET | Freq: Every day | ORAL | Status: DC

## 2014-07-26 ENCOUNTER — Other Ambulatory Visit (HOSPITAL_COMMUNITY): Payer: Self-pay | Admitting: *Deleted

## 2014-07-26 NOTE — Patient Instructions (Addendum)
[name]  07/26/2014   Your procedure is scheduled on: Monday March 21st, 2016  Report to [facility] Main  Entrance and follow signs to               [place] at 940 AM.  Call this number if you have problems the morning of surgery [phone]   Remember:  Do not eat food or drink liquids :After Midnight.     Take these medicines the morning of surgery with A SIP OF WATER: amlodipine                               You may not have any metal on your body including hair pins and              piercings  Do not wear jewelry, make-up, lotions, powders or perfumes.             Do not wear nail polish.  Do not shave  48 hours prior to surgery.              Men may shave face and neck.  Do not bring valuables to the hospital. [place].  Contacts, dentures or bridgework may not be worn into surgery.  Leave suitcase in the car. After surgery it may be brought to your room.               Please read over the following fact sheets you were given: MRSA information _____________________________________________________________________             [facility] - Preparing for Surgery Before surgery, you can play an important role.  Because skin is not sterile, your skin needs to be as free of germs as possible.  You can reduce the number of germs on your skin by washing with CHG (chlorahexidine gluconate) soap before surgery.  CHG is an antiseptic cleaner which kills germs and bonds with the skin to continue killing germs even after washing. Please DO NOT use if you have an allergy to CHG or antibacterial soaps.  If your skin becomes reddened/irritated stop using the CHG and inform your nurse when you arrive at Short Stay. Do not shave (including legs and underarms) for at least 48 hours prior to the first CHG shower.  You may shave your face/neck. Please follow these instructions carefully:  1.  Shower with CHG  Soap the night before surgery and the  morning of Surgery.  2.  If you choose to wash your hair, wash your hair first as usual with your  normal  shampoo.  3.  After you shampoo, rinse your hair and body thoroughly to remove the  shampoo.                           4.  Use CHG as you would any other liquid soap.  You can apply chg directly  to the skin and wash                       Gently with a scrungie or clean washcloth.  5.  Apply the CHG Soap to your body ONLY FROM THE NECK DOWN.   Do not  use on face/ open                           Wound or open sores. Avoid contact with eyes, ears mouth and genitals (private parts).                       Wash face,  Genitals (private parts) with your normal soap.             6.  Wash thoroughly, paying special attention to the area where your surgery  will be performed.  7.  Thoroughly rinse your body with warm water from the neck down.  8.  DO NOT shower/wash with your normal soap after using and rinsing off  the CHG Soap.                9.  Pat yourself dry with a clean towel.            10.  Wear clean pajamas.            11.  Place clean sheets on your bed the night of your first shower and do not  sleep with pets. Day of Surgery : Do not apply any lotions/deodorants the morning of surgery.  Please wear clean clothes to the hospital/surgery center.  FAILURE TO FOLLOW THESE INSTRUCTIONS MAY RESULT IN THE CANCELLATION OF YOUR SURGERY PATIENT SIGNATURE_________________________________  NURSE SIGNATURE__________________________________  ________________________________________________________________________   Adam Phenix  An incentive spirometer is a tool that can help keep your lungs clear and active. This tool measures how well you are filling your lungs with each breath. Taking long deep breaths may help reverse or decrease the chance of developing breathing (pulmonary) problems (especially infection) following:  A long period of time  when you are unable to move or be active. BEFORE THE PROCEDURE   If the spirometer includes an indicator to show your best effort, your nurse or respiratory therapist will set it to a desired goal.  If possible, sit up straight or lean slightly forward. Try not to slouch.  Hold the incentive spirometer in an upright position. INSTRUCTIONS FOR USE   Sit on the edge of your bed if possible, or sit up as far as you can in bed or on a chair.  Hold the incentive spirometer in an upright position.  Breathe out normally.  Place the mouthpiece in your mouth and seal your lips tightly around it.  Breathe in slowly and as deeply as possible, raising the piston or the ball toward the top of the column.  Hold your breath for 3-5 seconds or for as long as possible. Allow the piston or ball to fall to the bottom of the column.  Remove the mouthpiece from your mouth and breathe out normally.  Rest for a few seconds and repeat Steps 1 through 7 at least 10 times every 1-2 hours when you are awake. Take your time and take a few normal breaths between deep breaths.  The spirometer may include an indicator to show your best effort. Use the indicator as a goal to work toward during each repetition.  After each set of 10 deep breaths, practice coughing to be sure your lungs are clear. If you have an incision (the cut made at the time of surgery), support your incision when coughing by placing a pillow or rolled up towels firmly against it. Once you are able to get out of bed, walk  around indoors and cough well. You may stop using the incentive spirometer when instructed by your caregiver.  RISKS AND COMPLICATIONS  Take your time so you do not get dizzy or light-headed.  If you are in pain, you may need to take or ask for pain medication before doing incentive spirometry. It is harder to take a deep breath if you are having pain. AFTER USE  Rest and breathe slowly and easily.  It can be helpful to  keep track of a log of your progress. Your caregiver can provide you with a simple table to help with this. If you are using the spirometer at home, follow these instructions: [place] IF:   You are having difficultly using the spirometer.  You have trouble using the spirometer as often as instructed.  Your pain medication is not giving enough relief while using the spirometer.  You develop fever of 100.5 F (38.1 C) or higher. SEEK IMMEDIATE MEDICAL CARE IF:   You cough up bloody sputum that had not been present before.  You develop fever of 102 F (38.9 C) or greater.  You develop worsening pain at or near the incision site. MAKE SURE YOU:   Understand these instructions.  Will watch your condition.  Will get help right away if you are not doing well or get worse. Document Released: 09/14/2006 Document Revised: 07/27/2011 Document Reviewed: 11/15/2006 [employer] Patient Information 2014 [place], [place].   ________________________________________________________________________

## 2014-07-26 NOTE — Progress Notes (Signed)
Medical clearance note dr Maudie Mercury on chart for 08-06-14 surgery Cardiac clearance note dr hochrein 07-06-14 epic ekg 07-06-14 epic

## 2014-07-27 ENCOUNTER — Encounter (HOSPITAL_COMMUNITY): Payer: Self-pay

## 2014-07-27 ENCOUNTER — Encounter (HOSPITAL_COMMUNITY)
Admission: RE | Admit: 2014-07-27 | Discharge: 2014-07-27 | Disposition: A | Discharge status: Home / Self Care | Payer: Commercial Managed Care - HMO | Source: Ambulatory Visit | Attending: Orthopedic Surgery | Admitting: Orthopedic Surgery

## 2014-07-27 DIAGNOSIS — Z01812 Encounter for preprocedural laboratory examination: Secondary | ICD-10-CM | POA: Insufficient documentation

## 2014-07-27 DIAGNOSIS — M1712 Unilateral primary osteoarthritis, left knee: Secondary | ICD-10-CM | POA: Diagnosis not present

## 2014-07-27 HISTORY — DX: Anxiety disorder, unspecified: F41.9

## 2014-07-27 HISTORY — DX: Repeated falls: R29.6

## 2014-07-27 HISTORY — DX: Other allergy status, other than to drugs and biological substances: Z91.09

## 2014-07-27 HISTORY — DX: Personal history of urinary calculi: Z87.442

## 2014-07-27 LAB — URINALYSIS, ROUTINE W REFLEX MICROSCOPIC
Bilirubin Urine: NEGATIVE
Glucose, UA: NEGATIVE mg/dL
Hgb urine dipstick: NEGATIVE
Ketones, ur: NEGATIVE mg/dL
Nitrite: NEGATIVE
PROTEIN: NEGATIVE mg/dL
Specific Gravity, Urine: 1.009 (ref 1.005–1.030)
Urobilinogen, UA: 0.2 mg/dL (ref 0.0–1.0)
pH: 5.5 (ref 5.0–8.0)

## 2014-07-27 LAB — COMPREHENSIVE METABOLIC PANEL
ALBUMIN: 3.9 g/dL (ref 3.5–5.2)
ALT: 15 U/L (ref 0–35)
ANION GAP: 9 (ref 5–15)
AST: 19 U/L (ref 0–37)
Alkaline Phosphatase: 79 U/L (ref 39–117)
BUN: 17 mg/dL (ref 6–23)
CALCIUM: 9.3 mg/dL (ref 8.4–10.5)
CO2: 31 mmol/L (ref 19–32)
CREATININE: 0.61 mg/dL (ref 0.50–1.10)
Chloride: 98 mmol/L (ref 96–112)
GFR calc Af Amer: >90 mL/min (ref >90)
GFR calc non Af Amer: 87 mL/min — ABNORMAL LOW (ref >90)
Glucose, Bld: 127 mg/dL — ABNORMAL HIGH (ref 70–99)
Potassium: 3.5 mmol/L (ref 3.5–5.1)
Sodium: 138 mmol/L (ref 135–145)
TOTAL PROTEIN: 7.7 g/dL (ref 6.0–8.3)
Total Bilirubin: 0.4 mg/dL (ref 0.3–1.2)

## 2014-07-27 LAB — CBC
HCT: 40.8 % (ref 36.0–46.0)
Hemoglobin: 13.7 g/dL (ref 12.0–15.0)
MCH: 30.7 pg (ref 26.0–34.0)
MCHC: 33.6 g/dL (ref 30.0–36.0)
MCV: 91.5 fL (ref 78.0–100.0)
Platelets: 274 10*3/uL (ref 150–400)
RBC: 4.46 MIL/uL (ref 3.87–5.11)
RDW: 12.9 % (ref 11.5–15.5)
WBC: 11.1 10*3/uL — ABNORMAL HIGH (ref 4.0–10.5)

## 2014-07-27 LAB — URINE MICROSCOPIC-ADD ON

## 2014-07-27 LAB — APTT: APTT: 29 s (ref 24–37)

## 2014-07-27 LAB — PROTIME-INR
INR: 1 (ref 0.00–1.49)
Prothrombin Time: 13.3 seconds (ref 11.6–15.2)

## 2014-07-27 LAB — SURGICAL PCR SCREEN
MRSA, PCR: NEGATIVE
Staphylococcus aureus: NEGATIVE

## 2014-07-27 LAB — NO BLOOD PRODUCTS

## 2014-07-27 NOTE — Progress Notes (Signed)
Faxed blood refusal form to Dr. Anne Fu office and to blood bank.

## 2014-07-30 ENCOUNTER — Ambulatory Visit (HOSPITAL_BASED_OUTPATIENT_CLINIC_OR_DEPARTMENT_OTHER): Payer: Commercial Managed Care - HMO | Admitting: Hematology and Oncology

## 2014-07-30 ENCOUNTER — Telehealth: Payer: Self-pay | Admitting: Hematology and Oncology

## 2014-07-30 VITALS — BP 164/83 | HR 80 | Temp 97.9°F | Resp 18 | Ht 61.5 in | Wt 195.9 lb

## 2014-07-30 DIAGNOSIS — C50911 Malignant neoplasm of unspecified site of right female breast: Secondary | ICD-10-CM | POA: Diagnosis not present

## 2014-07-30 DIAGNOSIS — Z170 Estrogen receptor positive status [ER+]: Secondary | ICD-10-CM

## 2014-07-30 NOTE — Progress Notes (Signed)
Patient Care Team: [name], DO as PCP - General (Family Medicine)  DIAGNOSIS: No matching staging information was found for the patient.  SUMMARY OF ONCOLOGIC HISTORY:   Breast cancer, right breast   03/12/2010 Initial Diagnosis Invasive lobular cancer, ER 100%, PR 88% Ki-67 13%, HER-2 negative ratio 1.3   04/23/2010 Surgery Right breast lumpectomy invasive ductal carcinoma grade 1, 1 cm size one sentinel lymph node negative   04/28/2010 - 05/05/2010 Radiation Therapy Accelerated partial breast radiation with MammoSite   05/27/2010 -  Anti-estrogen oral therapy Arimidex 1 mg daily    CHIEF COMPLIANT: Follow-up of breast cancer  INTERVAL HISTORY: [name] is a 74 year old lady with above-mentioned history of right-sided breast cancer who was been on oral antiestrogen therapy for the past 4 years. She has been tolerating it extremely well without any hot flashes and muscle aches or pains. She is planning to undergo knee replacement surgery in a week and has been off anastrozole for the past 3 weeks.  REVIEW OF SYSTEMS:   Constitutional: Denies fevers, chills or abnormal weight loss Eyes: Denies blurriness of vision Ears, nose, mouth, throat, and face: Denies mucositis or sore throat Respiratory: Denies cough, dyspnea or wheezes Cardiovascular: Denies palpitation, chest discomfort or lower extremity swelling Gastrointestinal:  Denies nausea, heartburn or change in bowel habits Skin: Denies abnormal skin rashes Lymphatics: Denies new lymphadenopathy or easy bruising Neurological:Denies numbness, tingling or new weaknesses Behavioral/Psych: Mood is stable, no new changes  Breast:  denies any pain or lumps or nodules in either breasts All other systems were reviewed with the patient and are negative.  I have reviewed the past medical history, past surgical history, social history and family history with the patient and they are unchanged from previous note.  ALLERGIES:  is  allergic to other and sulfonamide derivatives.  MEDICATIONS:  Current Outpatient Prescriptions  Medication Sig Dispense Refill  . amLODipine (NORVASC) 10 MG tablet Take 1 tablet (10 mg total) by mouth daily. 90 tablet 1  . anastrozole (ARIMIDEX) 1 MG tablet TAKE ONE TABLET BY MOUTH ONCE DAILY 30 tablet 4  . Calcium Carbonate-Vitamin D (CALCIUM + D PO) Take 1 tablet by mouth daily.    . Cholecalciferol (VITAMIN D3) 5000 UNITS TABS Take 1 tablet by mouth daily.    . diphenhydramine-acetaminophen (TYLENOL PM) 25-500 MG TABS Take 1 tablet by mouth at bedtime as needed (for pain /sleep).    Marland Kitchen glucose blood (ACCU-CHEK AVIVA) test strip Test once per day 100 each 1  . Lancets (ACCU-CHEK SOFT TOUCH) lancets Use as instructed to check blood sugar 100 each 1  . lisinopril-hydrochlorothiazide (PRINZIDE,ZESTORETIC) 20-25 MG per tablet Take 1 tablet by mouth at bedtime.     . metFORMIN (GLUCOPHAGE) 1000 MG tablet $RemoveB'1000mg'NiDwrnUS$  (1 tablet) twice daily 180 tablet 3  . Multiple Vitamin (MULTIVITAMIN WITH MINERALS) TABS tablet Take 1 tablet by mouth daily.    Marland Kitchen oxybutynin (DITROPAN) 5 MG tablet Take 1 tablet (5 mg total) by mouth 2 (two) times daily. 60 tablet 1  . pramipexole (MIRAPEX) 1 MG tablet TAKE ONE TABLET BY MOUTH TWICE DAILY (Patient taking differently: Take 1 mg by mouth 2 (two) times daily as needed (leg cramps). ) 60 tablet 2  . sitaGLIPtin (JANUVIA) 100 MG tablet Take 1 tablet (100 mg total) by mouth daily. 30 tablet 1   No current facility-administered medications for this visit.    PHYSICAL EXAMINATION: ECOG PERFORMANCE STATUS: 1 - Symptomatic but completely ambulatory  Filed Vitals:  07/30/14 0905  BP: 164/83  Pulse: 80  Temp: 97.9 F (36.6 C)  Resp: 18   Filed Weights   07/30/14 0905  Weight: 195 lb 14.4 oz (88.86 kg)    GENERAL:alert, no distress and comfortable SKIN: skin color, texture, turgor are normal, no rashes or significant lesions EYES: normal, Conjunctiva are pink and  non-injected, sclera clear OROPHARYNX:no exudate, no erythema and lips, buccal mucosa, and tongue normal  NECK: supple, thyroid normal size, non-tender, without nodularity LYMPH:  no palpable lymphadenopathy in the cervical, axillary or inguinal LUNGS: clear to auscultation and percussion with normal breathing effort HEART: regular rate & rhythm and no murmurs and no lower extremity edema ABDOMEN:abdomen soft, non-tender and normal bowel sounds Musculoskeletal:no cyanosis of digits and no clubbing  NEURO: alert & oriented x 3 with fluent speech, no focal motor/sensory deficits BREAST: No palpable masses or nodules in either right or left breasts. No palpable axillary supraclavicular or infraclavicular adenopathy no breast tenderness or nipple discharge. (exam performed in the presence of a chaperone)  LABORATORY DATA:  I have reviewed the data as listed   Chemistry      Component Value Date/Time   NA 138 07/27/2014 1000   NA 139 01/25/2014 0959   K 3.5 07/27/2014 1000   K 3.8 01/25/2014 0959   CL 98 07/27/2014 1000   CL 99 10/06/2012 0855   CO2 31 07/27/2014 1000   CO2 27 01/25/2014 0959   BUN 17 07/27/2014 1000   BUN 13.6 01/25/2014 0959   CREATININE 0.61 07/27/2014 1000   CREATININE 0.8 01/25/2014 0959   CREATININE 0.90 10/03/2010 1635      Component Value Date/Time   CALCIUM 9.3 07/27/2014 1000   CALCIUM 9.3 01/25/2014 0959   ALKPHOS 79 07/27/2014 1000   ALKPHOS 97 01/25/2014 0959   AST 19 07/27/2014 1000   AST 18 01/25/2014 0959   ALT 15 07/27/2014 1000   ALT 18 01/25/2014 0959   BILITOT 0.4 07/27/2014 1000   BILITOT 0.30 01/25/2014 0959       Lab Results  Component Value Date   WBC 11.1* 07/27/2014   HGB 13.7 07/27/2014   HCT 40.8 07/27/2014   MCV 91.5 07/27/2014   PLT 274 07/27/2014   NEUTROABS 6.9* 01/25/2014     RADIOGRAPHIC STUDIES: I have personally reviewed the radiology reports and agreed with their findings. 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 She has been on antiestrogen therapy with Arimidex 1 mg daily since January 2012  Arimidex toxicities: 1. Bone density to be repeated May 2016. No evidence of osteoporosis in 2014 2. Denies any hot flashes or myalgias.  Breast cancer surveillance: 1. Breast exam 07/30/2014 is normal 2. Mammogram 04/05/2014 was normal  Survivorship:Discussed the importance of physical exercise in decreasing the likelihood of breast cancer recurrence. Recommended 30 mins daily 6 days a week of either brisk walking or cycling or swimming. Encouraged patient to eat more fruits and vegetables and decrease red meat. Patient is limited by problems with her knees. Patient is planning to undergo knee replacement surgery on her left knee in 1 week. She is currently off anastrozole and will remain off for at least 6 weeks after surgery in order to prevent any risk for blood clot.  Return to clinic in Nov for follow-up after mammogram and bone density  No orders of the defined types were placed in this encounter.   The patient has a good understanding of the overall plan. she agrees with it. She will call with any problems that may develop before her next visit here.   Rulon Eisenmenger, MD

## 2014-07-30 NOTE — Telephone Encounter (Signed)
appts made and printed avs for pt  [name]

## 2014-07-30 NOTE — Assessment & Plan Note (Addendum)
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 She has been on antiestrogen therapy with Arimidex 1 mg daily since January 2012  Arimidex toxicities: 1. Bone density to be repeated May 2016. No evidence of osteoporosis in 2014 2. Denies any hot flashes or myalgias.  Breast cancer surveillance: 1. Breast exam 07/30/2014 is normal 2. Mammogram 04/05/2014 was normal  Survivorship:Discussed the importance of physical exercise in decreasing the likelihood of breast cancer recurrence. Recommended 30 mins daily 6 days a week of either brisk walking or cycling or swimming. Encouraged patient to eat more fruits and vegetables and decrease red meat. Patient is limited by problems with her knees.  Return to clinic in 6 months for follow-up

## 2014-08-01 NOTE — H&P (Signed)
TOTAL KNEE ADMISSION H&P  Patient is being admitted for left total knee arthroplasty.  Subjective:  Chief Complaint:left knee pain.  HPI: [name], 74 y.o. female, has a history of pain and functional disability in the left knee due to arthritis and has failed non-surgical conservative treatments for greater than 12 weeks to includeNSAID's and/or analgesics, corticosteriod injections and activity modification.  Onset of symptoms was gradual, starting 5 years ago with gradually worsening course since that time. The patient noted no past surgery on the left knee(s).  Patient currently rates pain in the left knee(s) at 7 out of 10 with activity. Patient has night pain, worsening of pain with activity and weight bearing, pain that interferes with activities of daily living, pain with passive range of motion, crepitus and joint swelling.  Patient has evidence of periarticular osteophytes and joint space narrowing by imaging studies.  There is no active infection.  Patient Active Problem List   Diagnosis Date Noted  . Abnormal EKG 07/06/2014  . Type 2 diabetes mellitus without complication 37/62/8315  . Hearing loss - has hearing aides, follow by audiologist 10/11/2013  . No blood products ([employer])  09/11/2013  . OSTEOPENIA 06/10/2010  . Breast cancer, right breast 04/04/2010  . BACK PAIN, CHRONIC 08/30/2009  . SHOULDER PAIN, RIGHT 07/23/2009  . Overweight 10/18/2008  . URINARY FREQUENCY, CHRONIC 07/18/2008  . RESTLESS LEGS SYNDROME 05/05/2007  . OBSTRUCTIVE SLEEP APNEA 03/01/2007  . Essential hypertension 01/27/2007  . OSTEOARTHRITIS 01/27/2007   Past Medical History  Diagnosis Date  . Hypertension   . Arthritis   . Hearing loss   . Cancer     right breast   . History of radiation therapy 04/29/10-05/05/10    right breast 34Gy/76fx  . Actinic keratosis 07/18/2008    Qualifier: Diagnosis of  By: Arnoldo Morale MD, Balinda Quails   . Closed fracture of one or more phalanges of foot  03/29/2008    Qualifier: Diagnosis of  By: Arnoldo Morale MD, Balinda Quails   . No blood products ([employer])  09/11/2013  . RESTLESS LEGS SYNDROME 05/05/2007    Qualifier: Diagnosis of  By: Gwenette Greet MD, Armando Reichert   . Radiation 05/05/2010    Right Breast mammosite  . Sleep apnea   . History of environmental allergies   . Diabetes mellitus     type 2  . Anxiety     occasional  . History of kidney stones 20 years ago  . Falls frequently     "can not pick left leg up"    Past Surgical History  Procedure Laterality Date  . Flex signoidoscopy    . Incision and drainage perirectal abscess    . Tubal ligation  1979  . Breast surgery  2012    rt breast lumpectomy  . Appendectomy  age 28  . Joint replacement Right 2006    knee      Current outpatient prescriptions:  .  amLODipine (NORVASC) 10 MG tablet, Take 1 tablet (10 mg total) by mouth daily., Disp: 90 tablet, Rfl: 1 .  anastrozole (ARIMIDEX) 1 MG tablet, TAKE ONE TABLET BY MOUTH ONCE DAILY, Disp: 30 tablet, Rfl: 4 .  Calcium Carbonate-Vitamin D (CALCIUM + D PO), Take 1 tablet by mouth daily., Disp: , Rfl:  .  Cholecalciferol (VITAMIN D3) 5000 UNITS TABS, Take 1 tablet by mouth daily., Disp: , Rfl:  .  diphenhydramine-acetaminophen (TYLENOL PM) 25-500 MG TABS, Take 1 tablet by mouth at bedtime as needed (for pain /sleep).,  Disp: , Rfl:  .  lisinopril-hydrochlorothiazide (PRINZIDE,ZESTORETIC) 20-25 MG per tablet, Take 1 tablet by mouth at bedtime. , Disp: , Rfl:  .  metFORMIN (GLUCOPHAGE) 1000 MG tablet, 1000mg  (1 tablet) twice daily, Disp: 180 tablet, Rfl: 3 .  Multiple Vitamin (MULTIVITAMIN WITH MINERALS) TABS tablet, Take 1 tablet by mouth daily., Disp: , Rfl:  .  oxybutynin (DITROPAN) 5 MG tablet, Take 1 tablet (5 mg total) by mouth 2 (two) times daily., Disp: 60 tablet, Rfl: 1 .  pramipexole (MIRAPEX) 1 MG tablet, TAKE ONE TABLET BY MOUTH TWICE DAILY (Patient taking differently: Take 1 mg by mouth 2 (two) times daily as needed (leg cramps).  ), Disp: 60 tablet, Rfl: 2 .  sitaGLIPtin (JANUVIA) 100 MG tablet, Take 1 tablet (100 mg total) by mouth daily., Disp: 30 tablet, Rfl: 1 .  glucose blood (ACCU-CHEK AVIVA) test strip, Test once per day, Disp: 100 each, Rfl: 1 .  Lancets (ACCU-CHEK SOFT TOUCH) lancets, Use as instructed to check blood sugar, Disp: 100 each, Rfl: 1  Allergies  Allergen Reactions  . Other     BLOOD PRODUCT REFUSAL  . Sulfonamide Derivatives Swelling    History  Substance Use Topics  . Smoking status: Never Smoker   . Smokeless tobacco: Never Used  . Alcohol Use: No    Family History  Problem Relation Age of Onset  . Cancer Sister     breast  . Heart disease Paternal Grandfather   . Diabetes Other   . Obesity Other   . Heart disease Brother     Valve     Review of Systems  Constitutional: Positive for malaise/fatigue and diaphoresis. Negative for fever, chills and weight loss.  HENT: Positive for hearing loss. Negative for congestion, ear discharge, ear pain, nosebleeds, sore throat and tinnitus.   Eyes: Negative.   Respiratory: Positive for cough and shortness of breath. Negative for hemoptysis, sputum production, wheezing and stridor.        SOB on exertion  Cardiovascular: Positive for leg swelling. Negative for chest pain, palpitations, orthopnea, claudication and PND.  Gastrointestinal: Negative.   Genitourinary: Positive for frequency. Negative for dysuria, urgency, hematuria and flank pain.  Musculoskeletal: Positive for joint pain. Negative for myalgias, back pain, falls and neck pain.       Left knee pain  Skin: Positive for itching and rash.  Neurological: Positive for weakness. Negative for dizziness, tingling, tremors, sensory change, speech change, focal weakness, seizures, loss of consciousness and headaches.  Endo/Heme/Allergies: Negative.   Psychiatric/Behavioral: Negative.     Objective:  Physical Exam  Constitutional: She is oriented to person, place, and time. She  appears well-developed.  Overweight  HENT:  Head: Normocephalic and atraumatic.  Right Ear: External ear normal.  Left Ear: External ear normal.  Nose: Nose normal.  Mouth/Throat: Oropharynx is clear and moist.  Eyes: Conjunctivae and EOM are normal.  Neck: Normal range of motion. Neck supple.  Cardiovascular: Normal rate, regular rhythm, normal heart sounds and intact distal pulses.   No murmur heard. Respiratory: Effort normal and breath sounds normal. No respiratory distress. She has no wheezes.  GI: Soft. Bowel sounds are normal. She exhibits no distension. There is no tenderness.  Musculoskeletal:       Right hip: Normal.       Left hip: Normal.       Right knee: Normal.       Left knee: She exhibits decreased range of motion and swelling. She exhibits no effusion and  no erythema. Tenderness found. Medial joint line and lateral joint line tenderness noted.  Her left knee shows no swelling. Her range is about 5 to 120. There is marked crepitus on range of motion. She is tender medial greater than lateral. No instability.  Neurological: She is alert and oriented to person, place, and time. She has normal strength and normal reflexes. No sensory deficit.  Skin: No rash noted. She is not diaphoretic. No erythema.  Psychiatric: She has a normal mood and affect. Her behavior is normal.    Vitals  Weight: 194 lb Height: 61.75in Body Surface Area: 1.88 m Body Mass Index: 35.77 kg/m  Pulse: 80 (Regular)  BP: 142/84 (Sitting, Left Arm, Standard)  Imaging Review Plain radiographs demonstrate severe degenerative joint disease of the left knee(s). The overall alignment ismild varus. The bone quality appears to be good for age and reported activity level.  Assessment/Plan:  End stage primary osteoarthritis, left knee   The patient history, physical examination, clinical judgment of the provider and imaging studies are consistent with end stage degenerative joint disease  of the left knee(s) and total knee arthroplasty is deemed medically necessary. The treatment options including medical management, injection therapy arthroscopy and arthroplasty were discussed at length. The risks and benefits of total knee arthroplasty were presented and reviewed. The risks due to aseptic loosening, infection, stiffness, patella tracking problems, thromboembolic complications and other imponderables were discussed. The patient acknowledged the explanation, agreed to proceed with the plan and consent was signed. Patient is being admitted for inpatient treatment for surgery, pain control, PT, OT, prophylactic antibiotics, VTE prophylaxis, progressive ambulation and ADL's and discharge planning. The patient is planning to be discharged home with home health services   PCP: Dr. Maudie Mercury NO BLOOD PRODUCTS! TXA IV BP/IV left arm only   [employer], PA-C

## 2014-08-05 ENCOUNTER — Emergency Department (HOSPITAL_COMMUNITY)
Admission: EM | Admit: 2014-08-05 | Discharge: 2014-08-05 | Disposition: A | Discharge status: Home / Self Care | Payer: Commercial Managed Care - HMO | Source: Home / Self Care | Attending: Emergency Medicine | Admitting: Emergency Medicine

## 2014-08-05 ENCOUNTER — Encounter (HOSPITAL_COMMUNITY): Payer: Self-pay | Admitting: *Deleted

## 2014-08-05 DIAGNOSIS — M545 Low back pain, unspecified: Secondary | ICD-10-CM

## 2014-08-05 DIAGNOSIS — Z79899 Other long term (current) drug therapy: Secondary | ICD-10-CM

## 2014-08-05 DIAGNOSIS — I10 Essential (primary) hypertension: Secondary | ICD-10-CM | POA: Diagnosis present

## 2014-08-05 DIAGNOSIS — G8929 Other chronic pain: Secondary | ICD-10-CM

## 2014-08-05 DIAGNOSIS — Z87442 Personal history of urinary calculi: Secondary | ICD-10-CM | POA: Insufficient documentation

## 2014-08-05 DIAGNOSIS — M25562 Pain in left knee: Secondary | ICD-10-CM | POA: Diagnosis present

## 2014-08-05 DIAGNOSIS — Z6836 Body mass index (BMI) 36.0-36.9, adult: Secondary | ICD-10-CM | POA: Diagnosis not present

## 2014-08-05 DIAGNOSIS — R0902 Hypoxemia: Secondary | ICD-10-CM | POA: Diagnosis not present

## 2014-08-05 DIAGNOSIS — G2581 Restless legs syndrome: Secondary | ICD-10-CM

## 2014-08-05 DIAGNOSIS — M179 Osteoarthritis of knee, unspecified: Secondary | ICD-10-CM | POA: Diagnosis not present

## 2014-08-05 DIAGNOSIS — H9190 Unspecified hearing loss, unspecified ear: Secondary | ICD-10-CM | POA: Diagnosis present

## 2014-08-05 DIAGNOSIS — M1990 Unspecified osteoarthritis, unspecified site: Secondary | ICD-10-CM

## 2014-08-05 DIAGNOSIS — E669 Obesity, unspecified: Secondary | ICD-10-CM | POA: Diagnosis present

## 2014-08-05 DIAGNOSIS — Z9181 History of falling: Secondary | ICD-10-CM | POA: Insufficient documentation

## 2014-08-05 DIAGNOSIS — Z853 Personal history of malignant neoplasm of breast: Secondary | ICD-10-CM | POA: Diagnosis not present

## 2014-08-05 DIAGNOSIS — R0602 Shortness of breath: Secondary | ICD-10-CM | POA: Diagnosis not present

## 2014-08-05 DIAGNOSIS — Z923 Personal history of irradiation: Secondary | ICD-10-CM

## 2014-08-05 DIAGNOSIS — R0989 Other specified symptoms and signs involving the circulatory and respiratory systems: Secondary | ICD-10-CM | POA: Diagnosis not present

## 2014-08-05 DIAGNOSIS — E119 Type 2 diabetes mellitus without complications: Secondary | ICD-10-CM

## 2014-08-05 DIAGNOSIS — Z8659 Personal history of other mental and behavioral disorders: Secondary | ICD-10-CM | POA: Insufficient documentation

## 2014-08-05 DIAGNOSIS — Z01812 Encounter for preprocedural laboratory examination: Secondary | ICD-10-CM | POA: Diagnosis not present

## 2014-08-05 DIAGNOSIS — M8580 Other specified disorders of bone density and structure, unspecified site: Secondary | ICD-10-CM | POA: Diagnosis present

## 2014-08-05 DIAGNOSIS — M1712 Unilateral primary osteoarthritis, left knee: Secondary | ICD-10-CM | POA: Diagnosis present

## 2014-08-05 MED ORDER — OXYCODONE-ACETAMINOPHEN 5-325 MG PO TABS
1.0000 | ORAL_TABLET | Freq: Four times a day (QID) | ORAL | Status: DC | PRN
Start: 2014-08-05 — End: 2014-08-10

## 2014-08-05 MED ORDER — OXYCODONE-ACETAMINOPHEN 5-325 MG PO TABS
2.0000 | ORAL_TABLET | Freq: Once | ORAL | Status: AC
  Administered 2014-08-05: 2 via ORAL
  Filled 2014-08-05: qty 2

## 2014-08-05 MED ORDER — MORPHINE SULFATE 4 MG/ML IJ SOLN
4.0000 mg | Freq: Once | INTRAMUSCULAR | Status: AC
  Administered 2014-08-05: 4 mg via INTRAMUSCULAR
  Filled 2014-08-05: qty 1

## 2014-08-05 MED ORDER — METHOCARBAMOL 500 MG PO TABS
500.0000 mg | ORAL_TABLET | Freq: Once | ORAL | Status: AC
  Administered 2014-08-05: 500 mg via ORAL
  Filled 2014-08-05: qty 1

## 2014-08-05 MED ORDER — DIAZEPAM 5 MG PO TABS
5.0000 mg | ORAL_TABLET | Freq: Once | ORAL | Status: AC
  Administered 2014-08-05: 5 mg via ORAL
  Filled 2014-08-05: qty 1

## 2014-08-05 NOTE — ED Notes (Signed)
Pt resting.

## 2014-08-05 NOTE — ED Notes (Signed)
Per patient and family the morphine has made her pain worse. She is requesting percocet and robaxin which has worked in the past.  Pt' s husband reports that "if  I would have known you were giving her that I would've stopped you". This RN made family fully aware what was given and asked patient if she wanted prior to administration and patient reported yes. PA Joe made aware of patient's request.

## 2014-08-05 NOTE — ED Notes (Signed)
Pt reports lower back pain radiating to both legs, sts hx of same, usually goes away with medications, however this time she sts pain has been bothering her all night. Pt sts unable to sit down due to pain.

## 2014-08-05 NOTE — Discharge Instructions (Signed)
Back Pain, Adult °Low back pain is very common. About 1 in 5 people have back pain. The cause of low back pain is rarely dangerous. The pain often gets better over time. About half of people with a sudden onset of back pain feel better in just 2 weeks. About 8 in 10 people feel better by 6 weeks.  °CAUSES °Some common causes of back pain include: °· Strain of the muscles or ligaments supporting the spine. °· Wear and tear (degeneration) of the spinal discs. °· Arthritis. °· Direct injury to the back. °DIAGNOSIS °Most of the time, the direct cause of low back pain is not known. However, back pain can be treated effectively even when the exact cause of the pain is unknown. Answering your caregiver's questions about your overall health and symptoms is one of the most accurate ways to make sure the cause of your pain is not dangerous. If your caregiver needs more information, he or she may order lab work or imaging tests (X-rays or MRIs). However, even if imaging tests show changes in your back, this usually does not require surgery. °HOME CARE INSTRUCTIONS °For many people, back pain returns. Since low back pain is rarely dangerous, it is often a condition that people can learn to manage on their own.  °· Remain active. It is stressful on the back to sit or stand in one place. Do not sit, drive, or stand in one place for more than 30 minutes at a time. Take short walks on level surfaces as soon as pain allows. Try to increase the length of time you walk each day. °· Do not stay in bed. Resting more than 1 or 2 days can delay your recovery. °· Do not avoid exercise or work. Your body is made to move. It is not dangerous to be active, even though your back may hurt. Your back will likely heal faster if you return to being active before your pain is gone. °· Pay attention to your body when you  bend and lift. Many people have less discomfort when lifting if they bend their knees, keep the load close to their bodies, and  avoid twisting. Often, the most comfortable positions are those that put less stress on your recovering back. °· Find a comfortable position to sleep. Use a firm mattress and lie on your side with your knees slightly bent. If you lie on your back, put a pillow under your knees. °· Only take over-the-counter or prescription medicines as directed by your caregiver. Over-the-counter medicines to reduce pain and inflammation are often the most helpful. Your caregiver may prescribe muscle relaxant drugs. These medicines help dull your pain so you can more quickly return to your normal activities and healthy exercise. °· Put ice on the injured area. °¨ Put ice in a plastic bag. °¨ Place a towel between your skin and the bag. °¨ Leave the ice on for 15-20 minutes, 03-04 times a day for the first 2 to 3 days. After that, ice and heat may be alternated to reduce pain and spasms. °· Ask your caregiver about trying back exercises and gentle massage. This may be of some benefit. °· Avoid feeling anxious or stressed. Stress increases muscle tension and can worsen back pain. It is important to recognize when you are anxious or stressed and learn ways to manage it. Exercise is a great option. °SEEK MEDICAL CARE IF: °· You have pain that is not relieved with rest or medicine. °· You have pain that does not improve in 1 week. °· You have new symptoms. °· You are generally not feeling well. °SEEK   IMMEDIATE MEDICAL CARE IF:   You have pain that radiates from your back into your legs.  You develop new bowel or bladder control problems.  You have unusual weakness or numbness in your arms or legs.  You develop nausea or vomiting.  You develop abdominal pain.  You feel faint. Document Released: 05/04/2005 Document Revised: 11/03/2011 Document Reviewed: 09/05/2013 [facility] Patient Information 2015 [place], [place]. This information is not intended to replace advice given to you by your health care provider. Make sure you  discuss any questions you have with your health care provider.   Back Exercises Back exercises help treat and prevent back injuries. The goal of back exercises is to increase the strength of your abdominal and back muscles and the flexibility of your back. These exercises should be started when you no longer have back pain. Back exercises include:  Pelvic Tilt. Lie on your back with your knees bent. Tilt your pelvis until the lower part of your back is against the floor. Hold this position 5 to 10 sec and repeat 5 to 10 times.  Knee to Chest. Pull first 1 knee up against your chest and hold for 20 to 30 seconds, repeat this with the other knee, and then both knees. This may be done with the other leg straight or bent, whichever feels better.  Sit-Ups or Curl-Ups. Bend your knees 90 degrees. Start with tilting your pelvis, and do a partial, slow sit-up, lifting your trunk only 30 to 45 degrees off the floor. Take at least 2 to 3 seconds for each sit-up. Do not do sit-ups with your knees out straight. If partial sit-ups are difficult, simply do the above but with only tightening your abdominal muscles and holding it as directed.  Hip-Lift. Lie on your back with your knees flexed 90 degrees. Push down with your feet and shoulders as you raise your hips a couple inches off the floor; hold for 10 seconds, repeat 5 to 10 times.  Back arches. Lie on your stomach, propping yourself up on bent elbows. Slowly press on your hands, causing an arch in your low back. Repeat 3 to 5 times. Any initial stiffness and discomfort should lessen with repetition over time.  Shoulder-Lifts. Lie face down with arms beside your body. Keep hips and torso pressed to floor as you slowly lift your head and shoulders off the floor. Do not overdo your exercises, especially in the beginning. Exercises may cause you some mild back discomfort which lasts for a few minutes; however, if the pain is more severe, or lasts for more than  15 minutes, do not continue exercises until you see your caregiver. Improvement with exercise therapy for back problems is slow.  See your caregivers for assistance with developing a proper back exercise program. Document Released: 06/11/2004 Document Revised: 07/27/2011 Document Reviewed: 03/05/2011 [facility] Patient Information 2015 [place], [place]. This information is not intended to replace advice given to you by your health care provider. Make sure you discuss any questions you have with your health care provider.

## 2014-08-05 NOTE — ED Notes (Signed)
MD Plunkett at bedside. 

## 2014-08-05 NOTE — ED Provider Notes (Signed)
CSN: [id number]     Arrival date & time 08/05/14  0904 History   First MD Initiated Contact with Patient 08/05/14 0919     Chief Complaint  Patient presents with  . Back Pain     (Consider location/radiation/quality/duration/timing/severity/associated sxs/prior Treatment) HPI [name] is a 74 year old female with past medical history of chronic lower back pain who presents the ER complaining of exacerbation of back pain. Patient states her back pain occurs every night, and she typically can control it with home medications. Patient states this morning she was unable to control her pain, and her pain was worse than usual this morning. Patient states he has no new symptoms of her back, no weakness, numbness, saddle anesthesia, bowel/bladder incontinence/retention. Patient states she has no new concerning signs for back pain, only was unable control her pain at home.  Past Medical History  Diagnosis Date  . Hypertension   . Arthritis   . Hearing loss   . Cancer     right breast   . History of radiation therapy 04/29/10-05/05/10    right breast 34Gy/42fx  . Actinic keratosis 07/18/2008    Qualifier: Diagnosis of  By: Arnoldo Morale MD, Balinda Quails   . Closed fracture of one or more phalanges of foot 03/29/2008    Qualifier: Diagnosis of  By: Arnoldo Morale MD, Balinda Quails   . No blood products ([employer])  09/11/2013  . RESTLESS LEGS SYNDROME 05/05/2007    Qualifier: Diagnosis of  By: Gwenette Greet MD, Armando Reichert   . Radiation 05/05/2010    Right Breast mammosite  . Sleep apnea   . History of environmental allergies   . Diabetes mellitus     type 2  . Anxiety     occasional  . History of kidney stones 20 years ago  . Falls frequently     "can not pick left leg up"   Past Surgical History  Procedure Laterality Date  . Flex signoidoscopy    . Incision and drainage perirectal abscess    . Tubal ligation  1979  . Breast surgery  2012    rt breast lumpectomy  . Appendectomy  age 1  . Joint replacement  Right 2006    knee   Family History  Problem Relation Age of Onset  . Cancer Sister     breast  . Heart disease Paternal Grandfather   . Diabetes Other   . Obesity Other   . Heart disease Brother     Valve   History  Substance Use Topics  . Smoking status: Never Smoker   . Smokeless tobacco: Never Used  . Alcohol Use: No   OB History    No data available     Review of Systems  Constitutional: Negative for fever.  Eyes: Negative for visual disturbance.  Respiratory: Negative for shortness of breath.   Cardiovascular: Negative for chest pain.  Gastrointestinal: Negative for nausea, vomiting and abdominal pain.  Genitourinary: Negative for dysuria.  Musculoskeletal: Positive for back pain.  Skin: Negative for rash.  Neurological: Negative for dizziness, syncope, weakness and numbness.  Psychiatric/Behavioral: Negative.       Allergies  Other and Sulfonamide derivatives  Home Medications   Prior to Admission medications   Medication Sig Start Date End Date Taking? Authorizing Provider  amLODipine (NORVASC) 10 MG tablet Take 1 tablet (10 mg total) by mouth daily. 07/25/14  Yes [name], DO  Calcium Carbonate-Vitamin D (CALCIUM + D PO) Take 1 tablet by mouth daily.  Yes Historical Provider, MD  Cholecalciferol (VITAMIN D3) 5000 UNITS TABS Take 1 tablet by mouth daily.   Yes Historical Provider, MD  diphenhydramine-acetaminophen (TYLENOL PM) 25-500 MG TABS Take 1 tablet by mouth at bedtime as needed (for pain /sleep).   Yes Historical Provider, MD  lisinopril-hydrochlorothiazide (PRINZIDE,ZESTORETIC) 20-25 MG per tablet Take 1 tablet by mouth at bedtime.    Yes Historical Provider, MD  metFORMIN (GLUCOPHAGE) 1000 MG tablet 1000mg  (1 tablet) twice daily 09/11/13  Yes [name], DO  Multiple Vitamin (MULTIVITAMIN WITH MINERALS) TABS tablet Take 1 tablet by mouth daily.   Yes Historical Provider, MD  oxybutynin (DITROPAN) 5 MG tablet Take 1 tablet (5 mg total) by mouth  2 (two) times daily. 06/14/14  Yes [name], DO  pramipexole (MIRAPEX) 1 MG tablet TAKE ONE TABLET BY MOUTH TWICE DAILY Patient taking differently: Take 1 mg by mouth 2 (two) times daily as needed (leg cramps).  05/15/14  Yes [name], DO  sitaGLIPtin (JANUVIA) 100 MG tablet Take 1 tablet (100 mg total) by mouth daily. 05/28/14  Yes [name], DO  anastrozole (ARIMIDEX) 1 MG tablet TAKE ONE TABLET BY MOUTH ONCE DAILY 03/15/14   Nicholas Lose, MD  glucose blood (ACCU-CHEK AVIVA) test strip Test once per day 02/20/14   [name], DO  Lancets (ACCU-CHEK SOFT [facility]) lancets Use as instructed to check blood sugar 02/20/14   [name], DO  oxyCODONE-acetaminophen (PERCOCET) 5-325 MG per tablet Take 1-2 tablets by mouth every 6 (six) hours as needed. 08/05/14   Dahlia Bailiff, PA-C   BP 116/61 mmHg  Pulse 101  Temp(Src) 98.3 F (36.8 C) (Oral)  Resp 17  SpO2 100% Physical Exam  Constitutional: She is oriented to person, place, and time. She appears well-developed and well-nourished. No distress.  HENT:  Head: Normocephalic and atraumatic.  Eyes: Right eye exhibits no discharge. Left eye exhibits no discharge. No scleral icterus.  Neck: Normal range of motion.  Pulmonary/Chest: Effort normal. No respiratory distress.  Musculoskeletal: Normal range of motion.       Cervical back: Normal.       Thoracic back: Normal.       Back:  Neurological: She is alert and oriented to person, place, and time. She has normal strength. No cranial nerve deficit or sensory deficit. She displays a negative Romberg sign. Gait normal. GCS eye subscore is 4. GCS verbal subscore is 5. GCS motor subscore is 6.  Patient fully alert, answering questions appropriately in full, clear sentences, ambulatory around the room. Cranial nerves II through XII grossly intact. Motor strength 5 out of 5 in all major muscle groups of upper and lower extremities. Distal sensation intact.  Skin: Skin is warm and dry. She is not  diaphoretic.  Psychiatric: She has a normal mood and affect.  Nursing note and vitals reviewed.   ED Course  Procedures (including critical care time) Labs Review Labs Reviewed - No data to display  Imaging Review No results found.   EKG Interpretation None      MDM   Final diagnoses:  Midline low back pain without sciatica    Patient with back pain.  No neurological deficits and normal neuro exam.  Patient can walk but states is painful.  No loss of bowel or bladder control.  No concern for cauda equina.  No fever, night sweats, weight loss, h/o cancer, IVDU.  RICE protocol and pain medicine indicated and discussed with patient. I discussed  return precautions with patient, and patient verbalizes understanding and agreement of this plan. I encouraged patient to call or return to the ER should she have a questions or concerns.  BP 116/61 mmHg  Pulse 101  Temp(Src) 98.3 F (36.8 C) (Oral)  Resp 17  SpO2 100%  Signed,  Dahlia Bailiff, PA-C 12:38 PM  Patient seen and discussed with Dr. Blanchie Dessert, M.D.     Dahlia Bailiff, PA-C 08/05/14 [place], MD 08/09/14 [phone]

## 2014-08-05 NOTE — ED Notes (Signed)
Pt denies pain upon DC. She was advised to follow up with PCP. She was wheeled to the lobby by family. This RN offered however they declined.

## 2014-08-06 ENCOUNTER — Encounter (HOSPITAL_COMMUNITY)
Admission: RE | Disposition: A | Discharge status: Home Health | Payer: Self-pay | Source: Ambulatory Visit | Attending: Orthopedic Surgery

## 2014-08-06 ENCOUNTER — Inpatient Hospital Stay (HOSPITAL_COMMUNITY): Payer: Commercial Managed Care - HMO | Admitting: Anesthesiology

## 2014-08-06 ENCOUNTER — Inpatient Hospital Stay (HOSPITAL_COMMUNITY)
Admission: RE | Admit: 2014-08-06 | Discharge: 2014-08-10 | DRG: 470 | Disposition: A | Discharge status: Home Health | Payer: Commercial Managed Care - HMO | Source: Ambulatory Visit | Attending: Orthopedic Surgery | Admitting: Orthopedic Surgery

## 2014-08-06 ENCOUNTER — Encounter (HOSPITAL_COMMUNITY): Payer: Self-pay

## 2014-08-06 DIAGNOSIS — H9190 Unspecified hearing loss, unspecified ear: Secondary | ICD-10-CM | POA: Diagnosis present

## 2014-08-06 DIAGNOSIS — E119 Type 2 diabetes mellitus without complications: Secondary | ICD-10-CM | POA: Diagnosis present

## 2014-08-06 DIAGNOSIS — Z79899 Other long term (current) drug therapy: Secondary | ICD-10-CM | POA: Diagnosis not present

## 2014-08-06 DIAGNOSIS — M8580 Other specified disorders of bone density and structure, unspecified site: Secondary | ICD-10-CM | POA: Diagnosis present

## 2014-08-06 DIAGNOSIS — Z853 Personal history of malignant neoplasm of breast: Secondary | ICD-10-CM | POA: Diagnosis not present

## 2014-08-06 DIAGNOSIS — E669 Obesity, unspecified: Secondary | ICD-10-CM | POA: Diagnosis present

## 2014-08-06 DIAGNOSIS — G2581 Restless legs syndrome: Secondary | ICD-10-CM | POA: Diagnosis present

## 2014-08-06 DIAGNOSIS — Z6836 Body mass index (BMI) 36.0-36.9, adult: Secondary | ICD-10-CM

## 2014-08-06 DIAGNOSIS — I10 Essential (primary) hypertension: Secondary | ICD-10-CM | POA: Diagnosis present

## 2014-08-06 DIAGNOSIS — Z923 Personal history of irradiation: Secondary | ICD-10-CM

## 2014-08-06 DIAGNOSIS — R0902 Hypoxemia: Secondary | ICD-10-CM

## 2014-08-06 DIAGNOSIS — Z9889 Other specified postprocedural states: Secondary | ICD-10-CM

## 2014-08-06 DIAGNOSIS — M1712 Unilateral primary osteoarthritis, left knee: Secondary | ICD-10-CM | POA: Diagnosis present

## 2014-08-06 DIAGNOSIS — Z01812 Encounter for preprocedural laboratory examination: Secondary | ICD-10-CM

## 2014-08-06 DIAGNOSIS — M1710 Unilateral primary osteoarthritis, unspecified knee: Secondary | ICD-10-CM | POA: Diagnosis present

## 2014-08-06 DIAGNOSIS — M25562 Pain in left knee: Secondary | ICD-10-CM | POA: Diagnosis present

## 2014-08-06 DIAGNOSIS — M179 Osteoarthritis of knee, unspecified: Secondary | ICD-10-CM | POA: Diagnosis present

## 2014-08-06 HISTORY — PX: TOTAL KNEE ARTHROPLASTY: SHX125

## 2014-08-06 LAB — GLUCOSE, CAPILLARY
GLUCOSE-CAPILLARY: 184 mg/dL — AB (ref 70–99)
GLUCOSE-CAPILLARY: 223 mg/dL — AB (ref 70–99)
Glucose-Capillary: 123 mg/dL — ABNORMAL HIGH (ref 70–99)
Glucose-Capillary: 165 mg/dL — ABNORMAL HIGH (ref 70–99)

## 2014-08-06 SURGERY — ARTHROPLASTY, KNEE, TOTAL
Anesthesia: Spinal | Site: Knee | Laterality: Left

## 2014-08-06 MED ORDER — HYDROMORPHONE HCL 1 MG/ML IJ SOLN
0.2500 mg | INTRAMUSCULAR | Status: DC | PRN
  Administered 2014-08-06 (×3): 0.5 mg via INTRAVENOUS

## 2014-08-06 MED ORDER — ONDANSETRON HCL 4 MG/2ML IJ SOLN: 4.0000 mg | Freq: Once | INTRAMUSCULAR | Status: DC | PRN

## 2014-08-06 MED ORDER — METOCLOPRAMIDE HCL 5 MG/ML IJ SOLN
5.0000 mg | Freq: Three times a day (TID) | INTRAMUSCULAR | Status: DC | PRN
Start: 2014-08-06 — End: 2014-08-10
  Administered 2014-08-07: 10 mg via INTRAVENOUS
  Filled 2014-08-06: qty 2

## 2014-08-06 MED ORDER — DEXAMETHASONE SODIUM PHOSPHATE 10 MG/ML IJ SOLN
10.0000 mg | Freq: Once | INTRAMUSCULAR | Status: DC
  Filled 2014-08-06: qty 1

## 2014-08-06 MED ORDER — BUPIVACAINE LIPOSOME 1.3 % IJ SUSP
20.0000 mL | Freq: Once | INTRAMUSCULAR | Status: DC
  Filled 2014-08-06: qty 20

## 2014-08-06 MED ORDER — INSULIN ASPART 100 UNIT/ML ~~LOC~~ SOLN
0.0000 [IU] | Freq: Three times a day (TID) | SUBCUTANEOUS | Status: DC
  Administered 2014-08-06: 3 [IU] via SUBCUTANEOUS
  Administered 2014-08-07: 5 [IU] via SUBCUTANEOUS
  Administered 2014-08-07 – 2014-08-08 (×2): 3 [IU] via SUBCUTANEOUS
  Administered 2014-08-08: 5 [IU] via SUBCUTANEOUS
  Administered 2014-08-08 – 2014-08-09 (×2): 3 [IU] via SUBCUTANEOUS
  Administered 2014-08-09: 2 [IU] via SUBCUTANEOUS
  Administered 2014-08-09 – 2014-08-10 (×2): 3 [IU] via SUBCUTANEOUS
  Administered 2014-08-10: 2 [IU] via SUBCUTANEOUS

## 2014-08-06 MED ORDER — ONDANSETRON HCL 4 MG/2ML IJ SOLN
4.0000 mg | Freq: Four times a day (QID) | INTRAMUSCULAR | Status: DC | PRN
  Administered 2014-08-07: 4 mg via INTRAVENOUS
  Filled 2014-08-06: qty 2

## 2014-08-06 MED ORDER — EPHEDRINE SULFATE 50 MG/ML IJ SOLN
INTRAMUSCULAR | Status: AC
  Filled 2014-08-06: qty 1

## 2014-08-06 MED ORDER — CEFAZOLIN SODIUM-DEXTROSE 2-3 GM-% IV SOLR
2.0000 g | INTRAVENOUS | Status: AC
  Administered 2014-08-06: 2 g via INTRAVENOUS

## 2014-08-06 MED ORDER — FENTANYL CITRATE 0.05 MG/ML IJ SOLN
INTRAMUSCULAR | Status: DC | PRN
  Administered 2014-08-06 (×2): 50 ug via INTRAVENOUS

## 2014-08-06 MED ORDER — DOCUSATE SODIUM 100 MG PO CAPS
100.0000 mg | ORAL_CAPSULE | Freq: Two times a day (BID) | ORAL | Status: DC
  Administered 2014-08-06 – 2014-08-10 (×8): 100 mg via ORAL

## 2014-08-06 MED ORDER — DIPHENHYDRAMINE HCL 12.5 MG/5ML PO ELIX: 12.5000 mg | ORAL_SOLUTION | ORAL | Status: DC | PRN

## 2014-08-06 MED ORDER — BUPIVACAINE HCL 0.25 % IJ SOLN
INTRAMUSCULAR | Status: DC | PRN
  Administered 2014-08-06: 20 mL

## 2014-08-06 MED ORDER — MENTHOL 3 MG MT LOZG: 1.0000 | LOZENGE | OROMUCOSAL | Status: DC | PRN

## 2014-08-06 MED ORDER — ONDANSETRON HCL 4 MG PO TABS: 4.0000 mg | ORAL_TABLET | Freq: Four times a day (QID) | ORAL | Status: DC | PRN

## 2014-08-06 MED ORDER — CEFAZOLIN SODIUM-DEXTROSE 2-3 GM-% IV SOLR
INTRAVENOUS | Status: AC
  Filled 2014-08-06: qty 50

## 2014-08-06 MED ORDER — AMLODIPINE BESYLATE 10 MG PO TABS
10.0000 mg | ORAL_TABLET | Freq: Every day | ORAL | Status: DC
  Administered 2014-08-07 – 2014-08-10 (×4): 10 mg via ORAL
  Filled 2014-08-06 (×4): qty 1

## 2014-08-06 MED ORDER — LIDOCAINE HCL (CARDIAC) 20 MG/ML IV SOLN
INTRAVENOUS | Status: AC
  Filled 2014-08-06: qty 5

## 2014-08-06 MED ORDER — POLYETHYLENE GLYCOL 3350 17 G PO PACK: 17.0000 g | PACK | Freq: Every day | ORAL | Status: DC | PRN

## 2014-08-06 MED ORDER — HYDROMORPHONE HCL 1 MG/ML IJ SOLN
INTRAMUSCULAR | Status: AC
  Administered 2014-08-07: 0.5 mg via INTRAVENOUS
  Filled 2014-08-06: qty 1

## 2014-08-06 MED ORDER — DIAZEPAM 5 MG PO TABS
5.0000 mg | ORAL_TABLET | Freq: Four times a day (QID) | ORAL | Status: DC | PRN
Start: 2014-08-06 — End: 2014-08-10
  Administered 2014-08-07: 5 mg via ORAL
  Filled 2014-08-06: qty 1

## 2014-08-06 MED ORDER — SODIUM CHLORIDE 0.9 % IV SOLN: INTRAVENOUS | Status: DC

## 2014-08-06 MED ORDER — BUPIVACAINE HCL (PF) 0.25 % IJ SOLN
INTRAMUSCULAR | Status: AC
  Filled 2014-08-06: qty 30

## 2014-08-06 MED ORDER — MIDAZOLAM HCL 2 MG/2ML IJ SOLN
INTRAMUSCULAR | Status: AC
  Filled 2014-08-06: qty 2

## 2014-08-06 MED ORDER — ACETAMINOPHEN 10 MG/ML IV SOLN
1000.0000 mg | Freq: Once | INTRAVENOUS | Status: AC
  Administered 2014-08-06: 1000 mg via INTRAVENOUS
  Filled 2014-08-06: qty 100

## 2014-08-06 MED ORDER — DEXAMETHASONE SODIUM PHOSPHATE 10 MG/ML IJ SOLN
10.0000 mg | Freq: Once | INTRAMUSCULAR | Status: AC
  Administered 2014-08-06: 10 mg via INTRAVENOUS

## 2014-08-06 MED ORDER — POTASSIUM CHLORIDE IN NACL 20-0.9 MEQ/L-% IV SOLN
INTRAVENOUS | Status: DC
  Administered 2014-08-06 – 2014-08-07 (×2): via INTRAVENOUS
  Filled 2014-08-06 (×2): qty 1000

## 2014-08-06 MED ORDER — HYDROMORPHONE HCL 1 MG/ML IJ SOLN
INTRAMUSCULAR | Status: AC
  Filled 2014-08-06: qty 1

## 2014-08-06 MED ORDER — OXYBUTYNIN CHLORIDE 5 MG PO TABS
5.0000 mg | ORAL_TABLET | Freq: Two times a day (BID) | ORAL | Status: DC
  Administered 2014-08-06 – 2014-08-10 (×8): 5 mg via ORAL
  Filled 2014-08-06 (×9): qty 1

## 2014-08-06 MED ORDER — MEPERIDINE HCL 50 MG/ML IJ SOLN: 6.2500 mg | INTRAMUSCULAR | Status: DC | PRN

## 2014-08-06 MED ORDER — SODIUM CHLORIDE 0.9 % IJ SOLN
INTRAMUSCULAR | Status: AC
  Filled 2014-08-06: qty 50

## 2014-08-06 MED ORDER — LACTATED RINGERS IV SOLN
INTRAVENOUS | Status: DC
  Administered 2014-08-06: 13:00:00 via INTRAVENOUS
  Administered 2014-08-06: 1000 mL via INTRAVENOUS

## 2014-08-06 MED ORDER — SODIUM CHLORIDE 0.9 % IJ SOLN
INTRAMUSCULAR | Status: DC | PRN
  Administered 2014-08-06: 30 mL

## 2014-08-06 MED ORDER — FLEET ENEMA 7-19 GM/118ML RE ENEM: 1.0000 | ENEMA | Freq: Once | RECTAL | Status: AC | PRN

## 2014-08-06 MED ORDER — EPHEDRINE SULFATE 50 MG/ML IJ SOLN
INTRAMUSCULAR | Status: DC | PRN
  Administered 2014-08-06 (×3): 10 mg via INTRAVENOUS

## 2014-08-06 MED ORDER — SODIUM CHLORIDE 0.9 % IR SOLN
Status: DC | PRN
  Administered 2014-08-06: 1000 mL

## 2014-08-06 MED ORDER — CEFAZOLIN SODIUM-DEXTROSE 2-3 GM-% IV SOLR
2.0000 g | Freq: Four times a day (QID) | INTRAVENOUS | Status: AC
  Administered 2014-08-06 (×2): 2 g via INTRAVENOUS
  Filled 2014-08-06 (×2): qty 50

## 2014-08-06 MED ORDER — SODIUM CHLORIDE 0.9 % IV SOLN
1000.0000 mg | INTRAVENOUS | Status: AC
  Administered 2014-08-06: 1000 mg via INTRAVENOUS
  Filled 2014-08-06: qty 10

## 2014-08-06 MED ORDER — TRAMADOL HCL 50 MG PO TABS
50.0000 mg | ORAL_TABLET | Freq: Four times a day (QID) | ORAL | Status: DC | PRN
  Administered 2014-08-08 – 2014-08-09 (×3): 100 mg via ORAL
  Administered 2014-08-09: 50 mg via ORAL
  Administered 2014-08-10 (×2): 100 mg via ORAL
  Filled 2014-08-06 (×2): qty 2
  Filled 2014-08-06: qty 1
  Filled 2014-08-06 (×3): qty 2

## 2014-08-06 MED ORDER — SODIUM CHLORIDE 0.9 % IJ SOLN
INTRAMUSCULAR | Status: AC
  Filled 2014-08-06: qty 10

## 2014-08-06 MED ORDER — ACETAMINOPHEN 650 MG RE SUPP: 650.0000 mg | Freq: Four times a day (QID) | RECTAL | Status: DC | PRN

## 2014-08-06 MED ORDER — OXYCODONE HCL 5 MG PO TABS
5.0000 mg | ORAL_TABLET | ORAL | Status: DC | PRN
  Administered 2014-08-06 – 2014-08-07 (×4): 10 mg via ORAL
  Administered 2014-08-07: 5 mg via ORAL
  Filled 2014-08-06 (×3): qty 2
  Filled 2014-08-06 (×2): qty 1
  Filled 2014-08-06: qty 2
  Filled 2014-08-06: qty 1

## 2014-08-06 MED ORDER — METHOCARBAMOL 1000 MG/10ML IJ SOLN
500.0000 mg | Freq: Four times a day (QID) | INTRAVENOUS | Status: DC | PRN
  Administered 2014-08-06: 500 mg via INTRAVENOUS
  Filled 2014-08-06 (×2): qty 5

## 2014-08-06 MED ORDER — DEXAMETHASONE SODIUM PHOSPHATE 10 MG/ML IJ SOLN
INTRAMUSCULAR | Status: AC
  Filled 2014-08-06: qty 1

## 2014-08-06 MED ORDER — ACETAMINOPHEN 325 MG PO TABS
650.0000 mg | ORAL_TABLET | Freq: Four times a day (QID) | ORAL | Status: DC | PRN
  Administered 2014-08-09 (×2): 650 mg via ORAL
  Filled 2014-08-06 (×2): qty 2

## 2014-08-06 MED ORDER — METHOCARBAMOL 500 MG PO TABS
500.0000 mg | ORAL_TABLET | Freq: Four times a day (QID) | ORAL | Status: DC | PRN
  Administered 2014-08-06 – 2014-08-10 (×10): 500 mg via ORAL
  Filled 2014-08-06 (×10): qty 1

## 2014-08-06 MED ORDER — PROPOFOL 10 MG/ML IV BOLUS
INTRAVENOUS | Status: AC
  Filled 2014-08-06: qty 20

## 2014-08-06 MED ORDER — FENTANYL CITRATE 0.05 MG/ML IJ SOLN
INTRAMUSCULAR | Status: AC
  Filled 2014-08-06: qty 2

## 2014-08-06 MED ORDER — PRAMIPEXOLE DIHYDROCHLORIDE 1 MG PO TABS
1.0000 mg | ORAL_TABLET | Freq: Two times a day (BID) | ORAL | Status: DC | PRN
  Administered 2014-08-06 – 2014-08-09 (×4): 1 mg via ORAL
  Filled 2014-08-06 (×6): qty 1

## 2014-08-06 MED ORDER — POTASSIUM CHLORIDE IN NACL 20-0.9 MEQ/L-% IV SOLN
INTRAVENOUS | Status: AC
  Filled 2014-08-06: qty 1000

## 2014-08-06 MED ORDER — METFORMIN HCL 500 MG PO TABS
1000.0000 mg | ORAL_TABLET | Freq: Two times a day (BID) | ORAL | Status: DC
  Filled 2014-08-06 (×3): qty 2

## 2014-08-06 MED ORDER — MORPHINE SULFATE 2 MG/ML IJ SOLN
1.0000 mg | INTRAMUSCULAR | Status: DC | PRN
  Administered 2014-08-06 – 2014-08-07 (×4): 1 mg via INTRAVENOUS
  Filled 2014-08-06 (×5): qty 1

## 2014-08-06 MED ORDER — BACITRACIN-NEOMYCIN-POLYMYXIN 400-5-5000 EX OINT
TOPICAL_OINTMENT | Freq: Three times a day (TID) | CUTANEOUS | Status: DC
  Administered 2014-08-06: 1 via TOPICAL

## 2014-08-06 MED ORDER — LINAGLIPTIN 5 MG PO TABS
5.0000 mg | ORAL_TABLET | Freq: Every day | ORAL | Status: DC
  Administered 2014-08-07 – 2014-08-10 (×4): 5 mg via ORAL
  Filled 2014-08-06 (×4): qty 1

## 2014-08-06 MED ORDER — RIVAROXABAN 10 MG PO TABS
10.0000 mg | ORAL_TABLET | Freq: Every day | ORAL | Status: DC
  Administered 2014-08-07 – 2014-08-10 (×4): 10 mg via ORAL
  Filled 2014-08-06 (×6): qty 1

## 2014-08-06 MED ORDER — BISACODYL 10 MG RE SUPP: 10.0000 mg | Freq: Every day | RECTAL | Status: DC | PRN

## 2014-08-06 MED ORDER — BACITRACIN-NEOMYCIN-POLYMYXIN 400-5-5000 EX OINT
TOPICAL_OINTMENT | CUTANEOUS | Status: AC
  Filled 2014-08-06: qty 1

## 2014-08-06 MED ORDER — PROPOFOL INFUSION 10 MG/ML OPTIME
INTRAVENOUS | Status: DC | PRN
  Administered 2014-08-06: 140 ug/kg/min via INTRAVENOUS

## 2014-08-06 MED ORDER — ACETAMINOPHEN 500 MG PO TABS
1000.0000 mg | ORAL_TABLET | Freq: Four times a day (QID) | ORAL | Status: AC
  Administered 2014-08-06 (×2): 1000 mg via ORAL
  Administered 2014-08-07: 500 mg via ORAL
  Administered 2014-08-07: 1000 mg via ORAL
  Filled 2014-08-06 (×5): qty 2

## 2014-08-06 MED ORDER — METOCLOPRAMIDE HCL 10 MG PO TABS: 5.0000 mg | ORAL_TABLET | Freq: Three times a day (TID) | ORAL | Status: DC | PRN

## 2014-08-06 MED ORDER — BUPIVACAINE LIPOSOME 1.3 % IJ SUSP
INTRAMUSCULAR | Status: DC | PRN
Start: 2014-08-06 — End: 2014-08-06
  Administered 2014-08-06: 20 mL

## 2014-08-06 MED ORDER — BUPIVACAINE IN DEXTROSE 0.75-8.25 % IT SOLN
INTRATHECAL | Status: DC | PRN
  Administered 2014-08-06: 1.6 mL via INTRATHECAL

## 2014-08-06 MED ORDER — PHENOL 1.4 % MT LIQD
1.0000 | OROMUCOSAL | Status: DC | PRN
  Filled 2014-08-06: qty 177

## 2014-08-06 SURGICAL SUPPLY — 64 items
BAG DECANTER FOR FLEXI CONT (MISCELLANEOUS) ×2 IMPLANT
BAG SPEC THK2 15X12 ZIP CLS (MISCELLANEOUS) ×1
BAG ZIPLOCK 12X15 (MISCELLANEOUS) ×2 IMPLANT
BANDAGE ELASTIC 6 VELCRO ST LF (GAUZE/BANDAGES/DRESSINGS) ×2 IMPLANT
BANDAGE ESMARK 6X9 LF (GAUZE/BANDAGES/DRESSINGS) ×1 IMPLANT
BLADE SAG 18X100X1.27 (BLADE) ×2 IMPLANT
BLADE SAW SGTL 11.0X1.19X90.0M (BLADE) ×2 IMPLANT
BNDG CMPR 9X6 STRL LF SNTH (GAUZE/BANDAGES/DRESSINGS) ×1
BNDG ESMARK 6X9 LF (GAUZE/BANDAGES/DRESSINGS) ×2
BOWL SMART MIX CTS (DISPOSABLE) ×2 IMPLANT
CAP KNEE TOTAL 3 SIGMA ×1 IMPLANT
CEMENT HV SMART SET (Cement) ×4 IMPLANT
CUFF TOURN SGL QUICK 34 (TOURNIQUET CUFF) ×2
CUFF TRNQT CYL 34X4X40X1 (TOURNIQUET CUFF) ×1 IMPLANT
DECANTER SPIKE VIAL GLASS SM (MISCELLANEOUS) ×2 IMPLANT
DRAPE EXTREMITY T 121X128X90 (DRAPE) ×2 IMPLANT
DRAPE POUCH INSTRU U-SHP 10X18 (DRAPES) ×2 IMPLANT
DRAPE U-SHAPE 47X51 STRL (DRAPES) ×2 IMPLANT
DRSG ADAPTIC 3X8 NADH LF (GAUZE/BANDAGES/DRESSINGS) ×2 IMPLANT
DRSG PAD ABDOMINAL 8X10 ST (GAUZE/BANDAGES/DRESSINGS) ×1 IMPLANT
DURAPREP 26ML APPLICATOR (WOUND CARE) ×2 IMPLANT
ELECT REM PT RETURN 9FT ADLT (ELECTROSURGICAL) ×2
ELECTRODE REM PT RTRN 9FT ADLT (ELECTROSURGICAL) ×1 IMPLANT
EVACUATOR 1/8 PVC DRAIN (DRAIN) ×2 IMPLANT
FACESHIELD WRAPAROUND (MASK) ×10 IMPLANT
FACESHIELD WRAPAROUND OR TEAM (MASK) ×5 IMPLANT
GAUZE SPONGE 4X4 12PLY STRL (GAUZE/BANDAGES/DRESSINGS) ×2 IMPLANT
GLOVE BIO SURGEON STRL SZ7.5 (GLOVE) IMPLANT
GLOVE BIO SURGEON STRL SZ8 (GLOVE) ×2 IMPLANT
GLOVE BIOGEL PI IND STRL 6.5 (GLOVE) IMPLANT
GLOVE BIOGEL PI IND STRL 8 (GLOVE) ×1 IMPLANT
GLOVE BIOGEL PI INDICATOR 6.5 (GLOVE)
GLOVE BIOGEL PI INDICATOR 8 (GLOVE) ×1
GLOVE SURG SS PI 6.5 STRL IVOR (GLOVE) IMPLANT
GOWN STRL REUS W/TWL LRG LVL3 (GOWN DISPOSABLE) ×2 IMPLANT
GOWN STRL REUS W/TWL XL LVL3 (GOWN DISPOSABLE) IMPLANT
HANDPIECE INTERPULSE COAX TIP (DISPOSABLE) ×2
IMMOBILIZER KNEE 20 (SOFTGOODS) ×2
IMMOBILIZER KNEE 20 THIGH 36 (SOFTGOODS) ×1 IMPLANT
KIT BASIN OR (CUSTOM PROCEDURE TRAY) ×2 IMPLANT
MANIFOLD NEPTUNE II (INSTRUMENTS) ×2 IMPLANT
NDL SAFETY ECLIPSE 18X1.5 (NEEDLE) ×2 IMPLANT
NEEDLE HYPO 18GX1.5 SHARP (NEEDLE) ×4
NS IRRIG 1000ML POUR BTL (IV SOLUTION) ×2 IMPLANT
PACK TOTAL JOINT (CUSTOM PROCEDURE TRAY) ×2 IMPLANT
PAD ABD 8X10 STRL (GAUZE/BANDAGES/DRESSINGS) ×2 IMPLANT
PADDING CAST COTTON 6X4 STRL (CAST SUPPLIES) ×2 IMPLANT
PEN SKIN MARKING BROAD (MISCELLANEOUS) ×2 IMPLANT
POSITIONER SURGICAL ARM (MISCELLANEOUS) ×2 IMPLANT
SET HNDPC FAN SPRY TIP SCT (DISPOSABLE) ×1 IMPLANT
STRIP CLOSURE SKIN 1/2X4 (GAUZE/BANDAGES/DRESSINGS) ×3 IMPLANT
SUCTION FRAZIER 12FR DISP (SUCTIONS) ×2 IMPLANT
SUT MNCRL AB 4-0 PS2 18 (SUTURE) ×2 IMPLANT
SUT VIC AB 2-0 CT1 27 (SUTURE) ×6
SUT VIC AB 2-0 CT1 TAPERPNT 27 (SUTURE) ×3 IMPLANT
SUT VLOC 180 0 24IN GS25 (SUTURE) ×2 IMPLANT
SYR 20CC LL (SYRINGE) ×2 IMPLANT
SYR 50ML LL SCALE MARK (SYRINGE) ×2 IMPLANT
TOWEL OR 17X26 10 PK STRL BLUE (TOWEL DISPOSABLE) ×2 IMPLANT
TOWEL OR NON WOVEN STRL DISP B (DISPOSABLE) IMPLANT
TRAY FOLEY CATH 14FRSI W/METER (CATHETERS) ×2 IMPLANT
WATER STERILE IRR 1500ML POUR (IV SOLUTION) ×2 IMPLANT
WRAP KNEE MAXI GEL POST OP (GAUZE/BANDAGES/DRESSINGS) ×2 IMPLANT
YANKAUER SUCT BULB TIP 10FT TU (MISCELLANEOUS) ×2 IMPLANT

## 2014-08-06 NOTE — Progress Notes (Signed)
Hearing aids and glasses retrieved and returned to patient per her request.  They are in her room 1616.  Clarise Cruz, rn aware.

## 2014-08-06 NOTE — Anesthesia Preprocedure Evaluation (Signed)
Anesthesia Evaluation  Patient identified by MRN, date of birth, ID band Patient awake    Reviewed: Allergy & Precautions, NPO status   Airway Mallampati: I  TM Distance: >3 FB Neck ROM: Full    Dental   Pulmonary sleep apnea ,          Cardiovascular hypertension, Pt. on medications     Neuro/Psych    GI/Hepatic   Endo/Other  diabetes, Type 2, Oral Hypoglycemic Agents  Renal/GU      Musculoskeletal   Abdominal   Peds  Hematology   Anesthesia Other Findings   Reproductive/Obstetrics                             Anesthesia Physical Anesthesia Plan  ASA: II  Anesthesia Plan: Spinal   Post-op Pain Management:    Induction: Intravenous  Airway Management Planned: Natural Airway  Additional Equipment:   Intra-op Plan:   Post-operative Plan:   Informed Consent: I have reviewed the patients History and Physical, chart, labs and discussed the procedure including the risks, benefits and alternatives for the proposed anesthesia with the patient or authorized representative who has indicated his/her understanding and acceptance.     Plan Discussed with: CRNA and Surgeon  Anesthesia Plan Comments:         Anesthesia Quick Evaluation

## 2014-08-06 NOTE — Interval H&P Note (Signed)
History and Physical Interval Note:  08/06/2014 11:23 AM  [name]  has presented today for surgery, with the diagnosis of left knee osteoarthritis  The various methods of treatment have been discussed with the patient and family. After consideration of risks, benefits and other options for treatment, the patient has consented to  Procedure(s): LEFT TOTAL KNEE ARTHROPLASTY (Left) as a surgical intervention .  The patient's history has been reviewed, patient examined, no change in status, stable for surgery.  I have reviewed the patient's chart and labs.  Questions were answered to the patient's satisfaction.     Gearlean Alf

## 2014-08-06 NOTE — Progress Notes (Signed)
Utilization review completed.  

## 2014-08-06 NOTE — Op Note (Signed)
Pre-operative diagnosis- Osteoarthritis  Left knee(s)  Post-operative diagnosis- Osteoarthritis Left knee(s)  Procedure-  Left  Total Knee Arthroplasty  Surgeon- Vanessa Plover. Nautika Cressey, MD  Assistant- Arlee Muslim, PA-C   Anesthesia-  Spinal  EBL-* No blood loss amount entered *   Drains Hemovac  Tourniquet time-  Total Tourniquet Time Documented: Thigh (Left) - 27 minutes Total: Thigh (Left) - 27 minutes     Complications- None  Condition-PACU - hemodynamically stable.   Brief Clinical Note  [name] is a 74 y.o. year old female with end stage OA of her left knee with progressively worsening pain and dysfunction. She has constant pain, with activity and at rest and significant functional deficits with difficulties even with ADLs. She has had extensive non-op management including analgesics, injections of cortisone and viscosupplements, and home exercise program, but remains in significant pain with significant dysfunction. Radiographs show bone on bone arthritis medial and patellofemoral. She presents now for left Total Knee Arthroplasty.    Procedure in detail---   The patient is brought into the operating room and positioned supine on the operating table. After successful administration of  Spinal,   a tourniquet is placed high on the  Left thigh(s) and the lower extremity is prepped and draped in the usual sterile fashion. Time out is performed by the operating team and then the  Left lower extremity is wrapped in Esmarch, knee flexed and the tourniquet inflated to 300 mmHg.       A midline incision is made with a ten blade through the subcutaneous tissue to the level of the extensor mechanism. A fresh blade is used to make a medial parapatellar arthrotomy. Soft tissue over the proximal medial tibia is subperiosteally elevated to the joint line with a knife and into the semimembranosus bursa with a Cobb elevator. Soft tissue over the proximal lateral tibia is elevated with attention  being paid to avoiding the patellar tendon on the tibial tubercle. The patella is everted, knee flexed 90 degrees and the ACL and PCL are removed. Findings are bone on bone medial and patellofemoral with large medial osteophytes.        The drill is used to create a starting hole in the distal femur and the canal is thoroughly irrigated with sterile saline to remove the fatty contents. The 5 degree Left  valgus alignment guide is placed into the femoral canal and the distal femoral cutting block is pinned to remove 10 mm off the distal femur. Resection is made with an oscillating saw.      The tibia is subluxed forward and the menisci are removed. The extramedullary alignment guide is placed referencing proximally at the medial aspect of the tibial tubercle and distally along the second metatarsal axis and tibial crest. The block is pinned to remove 26mm off the more deficient medial  side. Resection is made with an oscillating saw. Size 3is the most appropriate size for the tibia and the proximal tibia is prepared with the modular drill and keel punch for that size.      The femoral sizing guide is placed and size 4 is most appropriate. Rotation is marked off the epicondylar axis and confirmed by creating a rectangular flexion gap at 90 degrees. The size 4 cutting block is pinned in this rotation and the anterior, posterior and chamfer cuts are made with the oscillating saw. The intercondylar block is then placed and that cut is made.      Trial size 3 tibial component,  trial size 4 narrow posterior stabilized femur and a 12.5  mm posterior stabilized rotating platform insert trial is placed. Full extension is achieved with excellent varus/valgus and anterior/posterior balance throughout full range of motion. The patella is everted and thickness measured to be 22  mm. Free hand resection is taken to 12 mm, a 35 template is placed, lug holes are drilled, trial patella is placed, and it tracks normally.  Osteophytes are removed off the posterior femur with the trial in place. All trials are removed and the cut bone surfaces prepared with pulsatile lavage. Cement is mixed and once ready for implantation, the size 3 tibial implant, size  4 narrow posterior stabilized femoral component, and the size 35 patella are cemented in place and the patella is held with the clamp. The trial insert is placed and the knee held in full extension. The Exparel (20 ml mixed with 30 ml saline) and .25% Bupivicaine, are injected into the extensor mechanism, posterior capsule, medial and lateral gutters and subcutaneous tissues.  All extruded cement is removed and once the cement is hard the permanent 12.5 mm posterior stabilized rotating platform insert is placed into the tibial tray.      The wound is copiously irrigated with saline solution and the extensor mechanism closed over a hemovac drain with #1 V-loc suture. The tourniquet is released for a total tourniquet time of 27  minutes. Flexion against gravity is 135 degrees and the patella tracks normally. Subcutaneous tissue is closed with 2.0 vicryl and subcuticular with running 4.0 Monocryl. The incision is cleaned and dried and steri-strips and a bulky sterile dressing are applied. The limb is placed into a knee immobilizer and the patient is awakened and transported to recovery in stable condition.      Please note that a surgical assistant was a medical necessity for this procedure in order to perform it in a safe and expeditious manner. Surgical assistant was necessary to retract the ligaments and vital neurovascular structures to prevent injury to them and also necessary for proper positioning of the limb to allow for anatomic placement of the prosthesis.   Vanessa Plover Cherylynn Liszewski, MD    08/06/2014, 1:18 PM

## 2014-08-06 NOTE — Anesthesia Procedure Notes (Signed)
Spinal Patient location during procedure: OR Start time: 08/06/2014 12:07 PM End time: 08/06/2014 12:15 PM Staffing Anesthesiologist: Lillia Abed Performed by: anesthesiologist  Preanesthetic Checklist Completed: patient identified, site marked, surgical consent, pre-op evaluation, timeout performed, IV checked, risks and benefits discussed and monitors and equipment checked Spinal Block Patient position: sitting Prep: Betadine Patient monitoring: heart rate, cardiac monitor, continuous pulse ox and blood pressure Approach: right paramedian Location: L3-4 Injection technique: single-shot Needle Needle type: Whitacre  Needle gauge: 24 G Needle length: 9 cm Needle insertion depth: 7 cm Assessment Sensory level: T6

## 2014-08-06 NOTE — Anesthesia Postprocedure Evaluation (Signed)
Anesthesia Post Note  Patient: [name]  Procedure(s) Performed: Procedure(s) (LRB): LEFT TOTAL KNEE ARTHROPLASTY (Left)  Anesthesia type: spinal  Patient location: PACU  Post pain: Pain level controlled  Post assessment: Patient's Cardiovascular Status Stable  Last Vitals:  Filed Vitals:   08/06/14 0945  BP: 163/80  Pulse: 79  Temp: 36.6 C  Resp: 20    Post vital signs: Reviewed and stable  Level of consciousness: sedated  Complications: No apparent anesthesia complications

## 2014-08-06 NOTE — Transfer of Care (Signed)
Immediate Anesthesia Transfer of Care Note  Patient: [name]  Procedure(s) Performed: Procedure(s): LEFT TOTAL KNEE ARTHROPLASTY (Left)  Patient Location: PACU  Anesthesia Type:Spinal  Level of Consciousness: awake, alert  and oriented  Airway & Oxygen Therapy: Patient Spontanous Breathing and Patient connected to face mask oxygen  Post-op Assessment: Report given to RN and Post -op Vital signs reviewed and stable  Post vital signs: Reviewed and stable  Last Vitals:  Filed Vitals:   08/06/14 0945  BP: 163/80  Pulse: 79  Temp: 36.6 C  Resp: 20    Complications: No apparent anesthesia complications

## 2014-08-07 LAB — CBC
HCT: 33.8 % — ABNORMAL LOW (ref 36.0–46.0)
HEMOGLOBIN: 11.4 g/dL — AB (ref 12.0–15.0)
MCH: 30.4 pg (ref 26.0–34.0)
MCHC: 33.7 g/dL (ref 30.0–36.0)
MCV: 90.1 fL (ref 78.0–100.0)
Platelets: 266 10*3/uL (ref 150–400)
RBC: 3.75 MIL/uL — ABNORMAL LOW (ref 3.87–5.11)
RDW: 12.8 % (ref 11.5–15.5)
WBC: 19.4 10*3/uL — ABNORMAL HIGH (ref 4.0–10.5)

## 2014-08-07 LAB — GLUCOSE, CAPILLARY
GLUCOSE-CAPILLARY: 197 mg/dL — AB (ref 70–99)
Glucose-Capillary: 207 mg/dL — ABNORMAL HIGH (ref 70–99)
Glucose-Capillary: 144 mg/dL — ABNORMAL HIGH (ref 70–99)
Glucose-Capillary: 196 mg/dL — ABNORMAL HIGH (ref 70–99)

## 2014-08-07 LAB — BASIC METABOLIC PANEL
Anion gap: 9 (ref 5–15)
BUN: 12 mg/dL (ref 6–23)
CHLORIDE: 90 mmol/L — AB (ref 96–112)
CO2: 28 mmol/L (ref 19–32)
Calcium: 8.2 mg/dL — ABNORMAL LOW (ref 8.4–10.5)
Creatinine, Ser: 0.48 mg/dL — ABNORMAL LOW (ref 0.50–1.10)
GFR calc Af Amer: >90 mL/min (ref >90)
Glucose, Bld: 194 mg/dL — ABNORMAL HIGH (ref 70–99)
POTASSIUM: 3.5 mmol/L (ref 3.5–5.1)
Sodium: 127 mmol/L — ABNORMAL LOW (ref 135–145)

## 2014-08-07 MED ORDER — HYDROCODONE-ACETAMINOPHEN 7.5-325 MG PO TABS
1.0000 | ORAL_TABLET | ORAL | Status: DC | PRN
  Administered 2014-08-07 – 2014-08-09 (×5): 2 via ORAL
  Filled 2014-08-07 (×5): qty 2

## 2014-08-07 MED ORDER — HYDROMORPHONE HCL 1 MG/ML IJ SOLN
0.5000 mg | INTRAMUSCULAR | Status: DC | PRN
  Administered 2014-08-07: 0.5 mg via INTRAVENOUS
  Filled 2014-08-07: qty 1

## 2014-08-07 MED ORDER — FUROSEMIDE 10 MG/ML IJ SOLN
10.0000 mg | Freq: Once | INTRAMUSCULAR | Status: AC
  Administered 2014-08-07: 10 mg via INTRAVENOUS
  Filled 2014-08-07: qty 1

## 2014-08-07 NOTE — Progress Notes (Signed)
Physical Therapy Treatment Patient Details Name: [name] MRN: [id number] DOB: 1940-06-15 Today's Date: 08/07/2014    History of Present Illness LTKA, post op low sats    PT Comments    Patient continues to be very drowsy and sats dropping into 80's on RA.  Replaced 2.5 l. RN aware.  Follow Up Recommendations  Home health PT;Supervision/Assistance - 24 hour     Equipment Recommendations  None recommended by PT    Recommendations for Other Services       Precautions / Restrictions Precautions Precautions: Fall;Knee Required Braces or Orthoses: Knee Immobilizer - Left Knee Immobilizer - Left: Discontinue once straight leg raise with < 10 degree lag    Mobility  Bed Mobility   Bed Mobility: Sit to Supine       Sit to supine: Mod assist   General bed mobility comments: assist legs onto bed, cues for technique  Transfers   Equipment used: Rolling walker (2 wheeled) Transfers: Sit to/from Stand Sit to Stand: Mod assist         General transfer comment: cues for  and posture, cues for deep breaths  Ambulation/Gait Ambulation/Gait assistance: Mod assist Ambulation Distance (Feet): 20 Feet Assistive device: Rolling walker (2 wheeled) Gait Pattern/deviations: Step-to pattern;Antalgic;Trunk flexed     General Gait Details: very slow ambulation, cues for sequence and posture, not SOB, sats 87% RA walking, replace 2.5 L with sats  hanging at 89. RN in room and aware.   Stairs            Wheelchair Mobility    Modified Rankin (Stroke Patients Only)       Balance                                    Cognition Arousal/Alertness: Lethargic                          Exercises      General Comments        Pertinent Vitals/Pain Pain Score: 7  Pain Location: L knee Pain Descriptors / Indicators: Aching;Squeezing;Tightness Pain Intervention(s): Limited activity within patient's tolerance;Monitored during session;Patient  requesting pain meds-RN notified;Ice applied    Home Living                      Prior Function            PT Goals (current goals can now be found in the care plan section) Progress towards PT goals: Progressing toward goals    Frequency  7X/week    PT Plan Current plan remains appropriate (may need to consider SNF if remains to have slow progress.)    Co-evaluation             End of Session   Activity Tolerance: Patient limited by fatigue;Patient limited by pain Patient left: in bed;with call bell/phone within reach;with family/visitor present     Time: 1244-1300 PT Time Calculation (min) (ACUTE ONLY): 16 min  Charges:  $Gait Training: 8-22 mins                    G Codes:      Claretha Cooper 08/07/2014, 2:02 PM

## 2014-08-07 NOTE — Progress Notes (Signed)
Physical Therapy Treatment Patient Details Name: JACAYLA NORDELL MRN: [id number] DOB: 08/10/1940 Today's Date: 08/07/2014    History of Present Illness LTKA, post op low sats    PT Comments    Remains lethargic  Follow Up Recommendations  Home health PT;Supervision/Assistance - 24 hour     Equipment Recommendations  None recommended by PT    Recommendations for Other Services       Precautions / Restrictions Precautions Precautions: Fall;Knee Required Braces or Orthoses: Knee Immobilizer - Left Knee Immobilizer - Left: Discontinue once straight leg raise with < 10 degree lag    Mobility  Bed Mobility    Transfers    Ambulation/Gait    Stairs            Wheelchair Mobility    Modified Rankin (Stroke Patients Only)       Balance                                    Cognition Arousal/Alertness: Lethargic                          Exercises Total Joint Exercises Ankle Circles/Pumps: AROM;Both;10 reps;Supine Quad Sets: AROM;Both;10 reps;Supine Short Arc Quad: AAROM;Left;10 reps;Supine Heel Slides: AAROM;Both;10 reps;Supine Hip ABduction/ADduction: AAROM;Left;10 reps;Supine Straight Leg Raises: AAROM;10 reps;Both    General Comments        Pertinent Vitals/Pain Pain Score: 7  Pain Location: L knee Pain Descriptors / Indicators: Aching;Squeezing;Tightness Pain Intervention(s): Limited activity within patient's tolerance;Monitored during session;Patient requesting pain meds-RN notified;Ice applied    Home Living                      Prior Function            PT Goals (current goals can now be found in the care plan section) Progress towards PT goals: Progressing toward goals    Frequency  7X/week    PT Plan Current plan remains appropriate    Co-evaluation             End of Session   Activity Tolerance: Patient tolerated treatment well Patient left: in bed;with call bell/phone within  reach;with bed alarm set     Time: 8032-1224 PT Time Calculation (min) (ACUTE ONLY): 16 min  Charges:  $Gait Training: 8-22 mins $Therapeutic Exercise: 8-22 mins                    G Codes:      Claretha Cooper 08/07/2014, 5:08 PM

## 2014-08-07 NOTE — Care Management Note (Addendum)
    Page 1 of 1   08/08/2014     2:33:01 PM CARE MANAGEMENT NOTE 08/08/2014  Patient:  [name], [name]   Account Number:  [id number]  Date Initiated:  08/07/2014  Documentation initiated by:  [facility]  Subjective/Objective Assessment:   adm: LEFT TOTAL KNEE ARTHROPLASTY (Left)     Action/Plan:   discharge planning   Anticipated DC Date:  08/08/2014   Anticipated DC Plan:  [place]  CM consult      [facility] Choice  HOME HEALTH   Choice offered to / List presented to:  C-1 Patient        [place] arranged  [facility]      [place].   Status of service:  Completed, signed off Medicare Important Message given?   (If response is "NO", the following Medicare IM given date fields will be blank) Date Medicare IM given:   Medicare IM given by:   Date Additional Medicare IM given:   Additional Medicare IM given by:    Discharge Disposition:  [place]  Per UR Regulation:    If discussed at Long Length of Stay Meetings, dates discussed:    Comments:  08/07/14 13:00 CM met with pt in room to offer choice of home health agency.  Pt chooses [facility] to render HHPT. Address and contact informaiton verifed by pt.  Pt has rolling walker at home and states she does not need a 3n1 bc hers is elevated and she can stabilize herself on the wash basin.  Referral called to [facility] rep, Kristen.    No other Cm needs were communicated.  Mariane Masters, BSN, Cm [phone].

## 2014-08-07 NOTE — Evaluation (Signed)
Physical Therapy Evaluation Patient Details Name: [name] MRN: [id number] DOB: 1940-11-23 Today's Date: 08/07/2014   History of Present Illness  LTKA, post op low sats  Clinical Impression  Patient is very drowsy, dozing when not stimulated. Sats 96% on 2.5, ambulated x 25' on RA, sats 83%. R notified. Dressing to L leg reinforced after noted blood saturating at site of Hemovac, RN aware. Patient will benefit from PT to address problems listed in note below.    Follow Up Recommendations Home health PT;Supervision/Assistance - 24 hour    Equipment Recommendations    none   Recommendations for Other Services       Precautions / Restrictions Precautions Precautions: Fall;Knee Required Braces or Orthoses: Knee Immobilizer - Left Knee Immobilizer - Left: Discontinue once straight leg raise with < 10 degree lag Restrictions Weight Bearing Restrictions: No      Mobility  Bed Mobility Overal bed mobility: Needs Assistance Bed Mobility: Supine to Sit     Supine to sit: Min assist;HOB elevated     General bed mobility comments: use of rail, support of LLE  Transfers Overall transfer level: Needs assistance Equipment used: Rolling walker (2 wheeled) Transfers: Sit to/from Stand Sit to Stand: +2 safety/equipment;Mod assist         General transfer comment: cues for sequence and posture, cues for deep breaths  Ambulation/Gait Ambulation/Gait assistance: Mod assist;+2 safety/equipment Ambulation Distance (Feet): 20 Feet Assistive device: Rolling walker (2 wheeled) Gait Pattern/deviations: Step-to pattern;Antalgic;Decreased step length - left;Decreased stance time - left     General Gait Details: Patient  noted to be wheezing after ambulation. frequent cues for safety.  Stairs            Wheelchair Mobility    Modified Rankin (Stroke Patients Only)       Balance Overall balance assessment: Needs assistance         Standing balance support: During  functional activity;Bilateral upper extremity supported Standing balance-Leahy Scale: Poor Standing balance comment: lost balance  backwards                             Pertinent Vitals/Pain Pain Assessment: 0-10 Pain Score: 3  Pain Descriptors / Indicators: Grimacing;Aching Pain Intervention(s): Monitored during session;Limited activity within patient's tolerance;Ice applied    Home Living Family/patient expects to be discharged to:: Private residence Living Arrangements: Spouse/significant other;Other relatives Available Help at Discharge: Family Type of Home: House Home Access: Stairs to enter Entrance Stairs-Rails: Psychiatric nurse of Steps: 2 Home Layout: One level Home Equipment: Environmental consultant - 2 wheels      Prior Function Level of Independence: Independent               Hand Dominance        Extremity/Trunk Assessment               Lower Extremity Assessment: LLE deficits/detail   LLE Deficits / Details: able to lift from bed     Communication   Communication: No difficulties  Cognition Arousal/Alertness: Lethargic (falling asleep, cannot focus) Behavior During Therapy: Flat affect Overall Cognitive Status: Within Functional Limits for tasks assessed (slow to respond)                      General Comments      Exercises Total Joint Exercises Ankle Circles/Pumps: AROM;Both;10 reps;Supine Quad Sets: AROM;Both;10 reps;Supine Straight Leg Raises: Left;5 reps;Supine;AAROM      Assessment/Plan  PT Assessment Patient needs continued PT services  PT Diagnosis Difficulty walking;Acute pain;Altered mental status   PT Problem List Decreased strength;Decreased range of motion;Decreased activity tolerance;Decreased balance;Decreased mobility;Cardiopulmonary status limiting activity;Decreased knowledge of precautions;Decreased safety awareness;Decreased knowledge of use of DME  PT Treatment Interventions DME  instruction;Gait training;Stair training;Functional mobility training;Therapeutic activities;Therapeutic exercise;Patient/family education   PT Goals (Current goals can be found in the Care Plan section) Acute Rehab PT Goals Patient Stated Goal: agreed to walk, to go home PT Goal Formulation: With patient/family Time For Goal Achievement: 08/14/14 Potential to Achieve Goals: Good    Frequency 7X/week   Barriers to discharge        Co-evaluation               End of Session   Activity Tolerance: Patient tolerated treatment well Patient left: in chair;with call bell/phone within reach;with family/visitor present Nurse Communication: Mobility status (sats dropped to 83%)         Time: 1443-1540 PT Time Calculation (min) (ACUTE ONLY): 25 min   Charges:   PT Evaluation $Initial PT Evaluation Tier I: 1 Procedure PT Treatments $Gait Training: 8-22 mins   PT G CodesClaretha Cooper 08/07/2014, 9:23 AM Tresa Endo PT [phone]

## 2014-08-07 NOTE — Progress Notes (Signed)
   Subjective: 1 Day Post-Op Procedure(s) (LRB): LEFT TOTAL KNEE ARTHROPLASTY (Left) Patient reports pain as mild.   Patient seen in rounds with Dr. Wynelle Link. Patient is well, but has had some minor complaints of pain in the knee, requiring pain medications We will start therapy today.  Plan is to go Home after hospital stay.  Objective: Vital signs in last 24 hours: Temp:  [97 F (36.1 C)-98 F (36.7 C)] 98 F (36.7 C) (03/22 8786) Pulse Rate:  [58-94] 75 (03/22 0638) Resp:  [16-23] 16 (03/22 0638) BP: (122-163)/(65-92) 163/91 mmHg (03/22 0638) SpO2:  [90 %-99 %] 98 % (03/22 0638) Weight:  [88.451 kg (195 lb)] 88.451 kg (195 lb) (03/21 0956)  Intake/Output from previous day:  Intake/Output Summary (Last 24 hours) at 08/07/14 0900 Last data filed at 08/07/14 0830  Gross per 24 hour  Intake 3912.5 ml  Output   2760 ml  Net 1152.5 ml    Intake/Output this shift: Total I/O In: 240 [P.O.:240] Out: -   Labs:  Recent Labs  08/07/14 0500  HGB 11.4*    Recent Labs  08/07/14 0500  WBC 19.4*  RBC 3.75*  HCT 33.8*  PLT 266    Recent Labs  08/07/14 0541  NA 127*  K 3.5  CL 90*  CO2 28  BUN 12  CREATININE 0.48*  GLUCOSE 194*  CALCIUM 8.2*   No results for input(s): LABPT, INR in the last 72 hours.  EXAM General - Patient is Alert, Appropriate and Oriented Extremity - Neurovascular intact Sensation intact distally Dorsiflexion/Plantar flexion intact No cellulitis present Dressing - dressing C/D/I Motor Function - intact, moving foot and toes well on exam.  Hemovac pulled without difficulty.  Past Medical History  Diagnosis Date  . Hypertension   . Arthritis   . Hearing loss   . Cancer     right breast   . History of radiation therapy 04/29/10-05/05/10    right breast 34Gy/35fx  . Actinic keratosis 07/18/2008    Qualifier: Diagnosis of  By: Arnoldo Morale MD, Balinda Quails   . Closed fracture of one or more phalanges of foot 03/29/2008    Qualifier: Diagnosis  of  By: Arnoldo Morale MD, Balinda Quails   . No blood products ([employer])  09/11/2013  . RESTLESS LEGS SYNDROME 05/05/2007    Qualifier: Diagnosis of  By: Gwenette Greet MD, Armando Reichert   . Radiation 05/05/2010    Right Breast mammosite  . Sleep apnea   . History of environmental allergies   . Diabetes mellitus     type 2  . Anxiety     occasional  . History of kidney stones 20 years ago  . Falls frequently     "can not pick left leg up"    Assessment/Plan: 1 Day Post-Op Procedure(s) (LRB): LEFT TOTAL KNEE ARTHROPLASTY (Left) Active Problems:   OA (osteoarthritis) of knee  Estimated body mass index is 36.25 kg/(m^2) as calculated from the following:   Height as of this encounter: 5' 1.5" (1.562 m).   Weight as of this encounter: 88.451 kg (195 lb). Advance diet Up with therapy Discharge home with home health  DVT Prophylaxis - Xarelto Weight-Bearing as tolerated to left leg D/C O2 and Pulse OX and try on Room Air DC fluids Stop the [place], PA-C Orthopaedic Surgery 08/07/2014, 9:00 AM

## 2014-08-07 NOTE — Discharge Instructions (Addendum)
° °Dr. Frank Aluisio °Total Joint Specialist °[facility] °[address] °[place], [place] [address] °[phone] ° °TOTAL KNEE REPLACEMENT POSTOPERATIVE DIRECTIONS ° ° ° °Knee Rehabilitation, Guidelines Following Surgery  °Results after knee surgery are often greatly improved when you follow the exercise, range of motion and muscle strengthening exercises prescribed by your doctor. Safety measures are also important to protect the knee from further injury. Any time any of these exercises cause you to have increased pain or swelling in your knee joint, decrease the amount until you are comfortable again and slowly increase them. If you have problems or questions, call your caregiver or physical therapist for advice.  ° °HOME CARE INSTRUCTIONS  °Remove items at home which could result in a fall. This includes throw rugs or furniture in walking pathways.  °Continue medications as instructed at time of discharge. °You may have some home medications which will be placed on hold until you complete the course of blood thinner medication.  °You may start showering once you are discharged home but do not submerge the incision under water. Just pat the incision dry and apply a dry gauze dressing on daily. °Walk with walker as instructed.  °You may resume a sexual relationship in one month or when given the OK by  your doctor.  °· Use walker as long as suggested by your caregivers. °· Avoid periods of inactivity such as sitting longer than an hour when not asleep. This helps prevent blood clots.  °You may put full weight on your legs and walk as much as is comfortable.  °You may return to work once you are cleared by your doctor.  °Do not drive a car for 6 weeks or until released by you surgeon.  °· Do not drive while taking narcotics.  °Wear the elastic stockings for three weeks following surgery during the day but you may remove then at night. °Make sure you keep all of your appointments after your  operation with all of your doctors and caregivers. You should call the office at the above phone number and make an appointment for approximately two weeks after the date of your surgery. °Change the dressing daily and reapply a dry dressing each time. °Please pick up a stool softener and laxative for home use as long as you are requiring pain medications. °· ICE to the affected knee every three hours for 30 minutes at a time and then as needed for pain and swelling.  Continue to use ice on the knee for pain and swelling from surgery. You may notice swelling that will progress down to the foot and ankle.  This is normal after surgery.  Elevate the leg when you are not up walking on it.   °It is important for you to complete the blood thinner medication as prescribed by your doctor. °· Continue to use the breathing machine which will help keep your temperature down.  It is common for your temperature to cycle up and down following surgery, especially at night when you are not up moving around and exerting yourself.  The breathing machine keeps your lungs expanded and your temperature down. ° °RANGE OF MOTION AND STRENGTHENING EXERCISES  °Rehabilitation of the knee is important following a knee injury or an operation. After just a few days of immobilization, the muscles of the thigh which control the knee become weakened and shrink (atrophy). Knee exercises are designed to build up the tone and strength of the thigh muscles and to improve knee   motion. Often times heat used for twenty to thirty minutes before working out will loosen up your tissues and help with improving the range of motion but do not use heat for the first two weeks following surgery. These exercises can be done on a training (exercise) mat, on the floor, on a table or on a bed. Use what ever works the best and is most comfortable for you Knee exercises include:  °Leg Lifts - While your knee is still immobilized in a splint or cast, you can do  straight leg raises. Lift the leg to 60 degrees, hold for 3 sec, and slowly lower the leg. Repeat 10-20 times 2-3 times daily. Perform this exercise against resistance later as your knee gets better.  °Quad and Hamstring Sets - Tighten up the muscle on the front of the thigh (Quad) and hold for 5-10 sec. Repeat this 10-20 times hourly. Hamstring sets are done by pushing the foot backward against an object and holding for 5-10 sec. Repeat as with quad sets.  °A rehabilitation program following serious knee injuries can speed recovery and prevent re-injury in the future due to weakened muscles. Contact your doctor or a physical therapist for more information on knee rehabilitation.  ° °SKILLED REHAB INSTRUCTIONS: °If the patient is transferred to a skilled rehab facility following release from the hospital, a list of the current medications will be sent to the facility for the patient to continue.  When discharged from the skilled rehab facility, please have the facility set up the patient's Home Health Physical Therapy prior to being released. Also, the skilled facility will be responsible for providing the patient with their medications at time of release from the facility to include their pain medication, the muscle relaxants, and their blood thinner medication. If the patient is still at the rehab facility at time of the two week follow up appointment, the skilled rehab facility will also need to assist the patient in arranging follow up appointment in our office and any transportation needs. ° °MAKE SURE YOU:  °Understand these instructions.  °Will watch your condition.  °Will get help right away if you are not doing well or get worse.  ° ° °Pick up stool softner and laxative for home use following surgery while on pain medications. °Do not submerge incision under water. °Please use good hand washing techniques while changing dressing each day. °May shower starting three days after surgery. °Please use a clean  towel to pat the incision dry following showers. °Continue to use ice for pain and swelling after surgery. °Do not use any lotions or creams on the incision until instructed by your surgeon. ° °Take Xarelto for two and a half more weeks, then discontinue Xarelto. °Once the patient has completed the blood thinner regimen, then take a Baby 81 mg Aspirin daily for three more weeks. ° °Postoperative Constipation Protocol ° °Constipation - defined medically as fewer than three stools per week and severe constipation as less than one stool per week. ° °One of the most common issues patients have following surgery is constipation.  Even if you have a regular bowel pattern at home, your normal regimen is likely to be disrupted due to multiple reasons following surgery.  Combination of anesthesia, postoperative narcotics, change in appetite and fluid intake all can affect your bowels.  In order to avoid complications following surgery, here are some recommendations in order to help you during your recovery period. ° °Colace (docusate) - Pick up an over-the-counter form of   Colace or another stool softener and take twice a day as long as you are requiring postoperative pain medications.  Take with a full glass of water daily.  If you experience loose stools or diarrhea, hold the colace until you stool forms back up.  If your symptoms do not get better within 1 week or if they get worse, check with your doctor.  Dulcolax (bisacodyl) - Pick up over-the-counter and take as directed by the product packaging as needed to assist with the movement of your bowels.  Take with a full glass of water.  Use this product as needed if not relieved by Colace only.   MiraLax (polyethylene glycol) - Pick up over-the-counter to have on hand.  MiraLax is a solution that will increase the amount of water in your bowels to assist with bowel movements.  Take as directed and can mix with a glass of water, juice, soda, coffee, or tea.  Take if you  go more than two days without a movement. Do not use MiraLax more than once per day. Call your doctor if you are still constipated or irregular after using this medication for 7 days in a row.  If you continue to have problems with postoperative constipation, please contact the office for further assistance and recommendations.  If you experience "the worst abdominal pain ever" or develop nausea or vomiting, please contact the office immediatly for further recommendations for treatment.    Information on my medicine - XARELTO (Rivaroxaban)  This medication education was reviewed with me or my healthcare representative as part of my discharge preparation.  The pharmacist that spoke with me during my hospital stay was:  Hershal Coria, [facility]  Why was Xarelto prescribed for you? Xarelto was prescribed for you to reduce the risk of blood clots forming after orthopedic surgery. The medical term for these abnormal blood clots is venous thromboembolism (VTE).  What do you need to know about xarelto ? Take your Xarelto ONCE DAILY at the same time every day. You may take it either with or without food.  If you have difficulty swallowing the tablet whole, you may crush it and mix in applesauce just prior to taking your dose.  Take Xarelto exactly as prescribed by your doctor and DO NOT stop taking Xarelto without talking to the doctor who prescribed the medication.  Stopping without other VTE prevention medication to take the place of Xarelto may increase your risk of developing a clot.  After discharge, you should have regular check-up appointments with your healthcare provider that is prescribing your Xarelto.    What do you do if you miss a dose? If you miss a dose, take it as soon as you remember on the same day then continue your regularly scheduled once daily regimen the next day. Do not take two doses of Xarelto on the same day.   Important Safety Information A possible side effect  of Xarelto is bleeding. You should call your healthcare provider right away if you experience any of the following: ? Bleeding from an injury or your nose that does not stop. ? Unusual colored urine (red or dark brown) or unusual colored stools (red or black). ? Unusual bruising for unknown reasons. ? A serious fall or if you hit your head (even if there is no bleeding).  Some medicines may interact with Xarelto and might increase your risk of bleeding while on Xarelto. To help avoid this, consult your healthcare provider or pharmacist prior to using any new  prescription or non-prescription medications, including herbals, vitamins, non-steroidal anti-inflammatory drugs (NSAIDs) and supplements.  This website has more information on Xarelto: [email or web address]

## 2014-08-07 NOTE — Progress Notes (Signed)
OT Cancellation Note  Patient Details Name: [name] MRN: [id number] DOB: [birth year]   Cancelled Treatment:    Reason Eval/Treat Not Completed: Other (comment). Pt was very sleepy with PT this am. Will check back later today or tomorrow.  Vinson Tietze 08/07/2014, 9:26 AM  Lesle Chris, OTR/L [phone] 08/07/2014

## 2014-08-08 ENCOUNTER — Inpatient Hospital Stay (HOSPITAL_COMMUNITY): Payer: Commercial Managed Care - HMO

## 2014-08-08 LAB — GLUCOSE, CAPILLARY
GLUCOSE-CAPILLARY: 190 mg/dL — AB (ref 70–99)
GLUCOSE-CAPILLARY: 162 mg/dL — AB (ref 70–99)
Glucose-Capillary: 201 mg/dL — ABNORMAL HIGH (ref 70–99)
Glucose-Capillary: 164 mg/dL — ABNORMAL HIGH (ref 70–99)
Glucose-Capillary: 165 mg/dL — ABNORMAL HIGH (ref 70–99)

## 2014-08-08 LAB — CBC
HCT: 34.1 % — ABNORMAL LOW (ref 36.0–46.0)
Hemoglobin: 11.5 g/dL — ABNORMAL LOW (ref 12.0–15.0)
MCH: 30.6 pg (ref 26.0–34.0)
MCHC: 33.7 g/dL (ref 30.0–36.0)
MCV: 90.7 fL (ref 78.0–100.0)
PLATELETS: 234 10*3/uL (ref 150–400)
RBC: 3.76 MIL/uL — AB (ref 3.87–5.11)
RDW: 12.9 % (ref 11.5–15.5)
WBC: 19.5 10*3/uL — AB (ref 4.0–10.5)

## 2014-08-08 LAB — BASIC METABOLIC PANEL
Anion gap: 10 (ref 5–15)
BUN: 11 mg/dL (ref 6–23)
CO2: 30 mmol/L (ref 19–32)
Calcium: 8.1 mg/dL — ABNORMAL LOW (ref 8.4–10.5)
Chloride: 88 mmol/L — ABNORMAL LOW (ref 96–112)
Creatinine, Ser: 0.47 mg/dL — ABNORMAL LOW (ref 0.50–1.10)
GFR calc non Af Amer: >90 mL/min (ref >90)
Glucose, Bld: 224 mg/dL — ABNORMAL HIGH (ref 70–99)
POTASSIUM: 3.2 mmol/L — AB (ref 3.5–5.1)
Sodium: 128 mmol/L — ABNORMAL LOW (ref 135–145)

## 2014-08-08 MED ORDER — METHOCARBAMOL 500 MG PO TABS: 500.0000 mg | ORAL_TABLET | Freq: Four times a day (QID) | ORAL | Status: DC | PRN

## 2014-08-08 MED ORDER — RIVAROXABAN 10 MG PO TABS: 10.0000 mg | ORAL_TABLET | Freq: Every day | ORAL | Status: DC

## 2014-08-08 MED ORDER — POTASSIUM CHLORIDE CRYS ER 20 MEQ PO TBCR
40.0000 meq | EXTENDED_RELEASE_TABLET | ORAL | Status: AC
  Administered 2014-08-08 (×3): 40 meq via ORAL
  Filled 2014-08-08 (×3): qty 2

## 2014-08-08 MED ORDER — HYDROCODONE-ACETAMINOPHEN 7.5-325 MG PO TABS: 1.0000 | ORAL_TABLET | ORAL | Status: DC | PRN

## 2014-08-08 MED ORDER — TRAMADOL HCL 50 MG PO TABS: 50.0000 mg | ORAL_TABLET | Freq: Four times a day (QID) | ORAL | Status: DC | PRN

## 2014-08-08 NOTE — Progress Notes (Signed)
Physical Therapy Treatment Patient Details Name: [name] MRN: [id number] DOB: 1941-05-13 Today's Date: 08/08/2014-11:38 treatment time    History of Present Illness LTKA, post op low sats    PT Comments    Patient  Sitting in recliner on 2 l O2. sats 88%, removed O2 with sats drop to 83%, replaced on 2 liters [place] to ambulate,  Sats 85% on 2 liters  After ambulating 100'. HR 115. Not noticeably SOB. Sats would not rise on 2 l so placed on 4 to get sats to 95%.  Returned to 2 l with sats 92%. RN  And PA aware of sats dropping. Received a chest xray. Results pending.   Follow Up Recommendations  Home health PT;Supervision/Assistance - 24 hour     Equipment Recommendations  None recommended by PT    Recommendations for Other Services       Precautions / Restrictions Precautions Precautions: Fall;Knee Precaution Comments: monitor sats and HR, has required O2. Required Braces or Orthoses: Knee Immobilizer - Left Knee Immobilizer - Left: Discontinue once straight leg raise with < 10 degree lag    Mobility  Bed Mobility                  Transfers Overall transfer level: Needs assistance Equipment used: Rolling walker (2 wheeled) Transfers: Sit to/from Stand Sit to Stand: Mod assist         General transfer comment: cues for  and posture, cues for deep breaths, lifting assist to power up.  Ambulation/Gait Ambulation/Gait assistance: Mod assist;+2 safety/equipment Ambulation Distance (Feet): 90 Feet Assistive device: Rolling walker (2 wheeled) Gait Pattern/deviations: Step-to pattern;Antalgic;Decreased stance time - left;Decreased step length - left     General Gait Details: slow  pace, monitored oxygen   Stairs            Wheelchair Mobility    Modified Rankin (Stroke Patients Only)       Balance                                    Cognition Arousal/Alertness: Lethargic (more awake than yesterday) Behavior During Therapy: Flat  affect                        Exercises      General Comments        Pertinent Vitals/Pain Pain Score: 3  Pain Location: L knee Pain Descriptors / Indicators: Aching Pain Intervention(s): Monitored during session;Ice applied;Premedicated before session    Home Living                      Prior Function            PT Goals (current goals can now be found in the care plan section) Progress towards PT goals: Progressing toward goals    Frequency  7X/week    PT Plan Current plan remains appropriate (may need to consider SNF if progress slow, see how patient is tomorrow.)    Co-evaluation             End of Session Equipment Utilized During Treatment: Gait belt;Oxygen Activity Tolerance: Patient limited by fatigue;Treatment limited secondary to medical complications (Comment) Patient left: in chair;with call bell/phone within reach;with family/visitor present     Time: 0867-6195 PT Time Calculation (min) (ACUTE ONLY): 40 min  Charges:  $Gait Training: 23-37 mins $Self Care/Home Management: 8-22  G Codes:      Claretha Cooper 08/08/2014, 2:16 PM Tresa Endo PT [phone]

## 2014-08-08 NOTE — Progress Notes (Signed)
Physical Therapy Treatment Patient Details Name: [name] MRN: [id number] DOB: 06-23-1940 Today's Date: 08/08/2014    History of Present Illness LTKA, post op low sats    PT Comments    Patient remains very lethargic, requiring 4 liters of O2 to keep sats > 90%. Ambulated x 50', sats 90% 4 L HR 115. After return to supine patient dozes, sats 87% 4 L. HR 100, BP 153/79. Patient appears mildly dyspneic but not  Overt. Requires constant cues  To stay awake enough to perform there Exer, dozes during  Performing exercises. RN aware of sats and O2 requirements.  Follow Up Recommendations  Home health PT;Supervision/Assistance - 24 hour (unless progress remains slow and mmay benefit from SNF. will  see how patient does tomorrow.)     Equipment Recommendations  None recommended by PT    Recommendations for Other Services       Precautions / Restrictions Precautions Precautions: Fall;Knee Precaution Comments: monitor sats and HR, has required O2. Required Braces or Orthoses: Knee Immobilizer - Left Knee Immobilizer - Left: Discontinue once straight leg raise with < 10 degree lag    Mobility  Bed Mobility Overal bed mobility: Needs Assistance Bed Mobility: Sit to Supine       Sit to supine: Min assist   General bed mobility comments: assist legs onto bed, cues for technique  Transfers Overall transfer level: Needs assistance Equipment used: Rolling walker (2 wheeled) Transfers: Sit to/from Stand Sit to Stand: Min assist         General transfer comment: cues for  and posture, cues for deep breaths, lifting assist to power up.  Ambulation/Gait Ambulation/Gait assistance: Mod assist;+2 physical assistance;+2 safety/equipment Ambulation Distance (Feet): 50 Feet Assistive device: Rolling walker (2 wheeled) Gait Pattern/deviations: Step-to pattern;Step-through pattern;Antalgic;Decreased step length - left;Decreased stance time - left Gait velocity: slow   General Gait  Details: slow  pace, monitored oxygen   Stairs            Wheelchair Mobility    Modified Rankin (Stroke Patients Only)       Balance                                    Cognition Arousal/Alertness: Lethargic Behavior During Therapy: Flat affect (requires constant stimulation unless  she is up right and moving.)                        Exercises Total Joint Exercises Quad Sets: AAROM;Left;Supine;5 reps Towel Squeeze: AAROM;10 reps;Supine Short Arc Quad: AAROM;Left;10 reps;Supine Heel Slides: AAROM;Both;10 reps;Supine Hip ABduction/ADduction: AAROM;Left;10 reps;Supine Straight Leg Raises: AAROM;10 reps;Both Goniometric ROM: 10-50 L knee    General Comments        Pertinent Vitals/Pain Pain Assessment: Faces Pain Score: 3  Faces Pain Scale: Hurts a little bit Pain Location: L knee Pain Descriptors / Indicators: Aching Pain Intervention(s): Monitored during session;Premedicated before session;Ice applied    Home Living                      Prior Function            PT Goals (current goals can now be found in the care plan section) Progress towards PT goals: Progressing toward goals    Frequency  7X/week    PT Plan Current plan remains appropriate    Co-evaluation  End of Session Equipment Utilized During Treatment: Gait belt;Oxygen Activity Tolerance: Patient limited by fatigue;Treatment limited secondary to medical complications (Comment) Patient left: in bed;with call bell/phone within reach;with family/visitor present     Time: 2548-6282 PT Time Calculation (min) (ACUTE ONLY): 38 min  Charges:  $Gait Training: 23-37 mins $Therapeutic Exercise: 8-22 mins $Self Care/Home Management: 8-22                    G Codes:      Claretha Cooper 08/08/2014, 3:27 PM Tresa Endo PT [phone]

## 2014-08-08 NOTE — Discharge Summary (Signed)
Physician Discharge Summary   Patient ID: [name] MRN: [id number] DOB/AGE: Sep 22, 1940 74 y.o.  Admit date: 08/06/2014 Discharge date: 08-09-2014  Primary Diagnosis:  Osteoarthritis Left knee(s) Admission Diagnoses:  Past Medical History  Diagnosis Date  . Hypertension   . Arthritis   . Hearing loss   . Cancer     right breast   . History of radiation therapy 04/29/10-05/05/10    right breast 34Gy/52f  . Actinic keratosis 07/18/2008    Qualifier: Diagnosis of  By: JArnoldo MoraleMD, JBalinda Quails  . Closed fracture of one or more phalanges of foot 03/29/2008    Qualifier: Diagnosis of  By: JArnoldo MoraleMD, JBalinda Quails  . No blood products (Jehovah Witness)  09/11/2013  . RESTLESS LEGS SYNDROME 05/05/2007    Qualifier: Diagnosis of  By: CGwenette GreetMD, KArmando Reichert  . Radiation 05/05/2010    Right Breast mammosite  . Sleep apnea   . History of environmental allergies   . Diabetes mellitus     type 2  . Anxiety     occasional  . History of kidney stones 20 years ago  . Falls frequently     "can not pick left leg up"   Discharge Diagnoses:   Active Problems:   OA (osteoarthritis) of knee  Estimated body mass index is 36.25 kg/(m^2) as calculated from the following:   Height as of this encounter: 5' 1.5" (1.562 m).   Weight as of this encounter: 88.451 kg (195 lb).  Procedure:  Procedure(s) (LRB): LEFT TOTAL KNEE ARTHROPLASTY (Left)   Consults: None  HPI: Vanessa LINDENBERGERis a 74y.o. year old female with end stage OA of her left knee with progressively worsening pain and dysfunction. She has constant pain, with activity and at rest and significant functional deficits with difficulties even with ADLs. She has had extensive non-op management including analgesics, injections of cortisone and viscosupplements, and home exercise program, but remains in significant pain with significant dysfunction. Radiographs show bone on bone arthritis medial and patellofemoral. She presents now for left Total Knee  Arthroplasty.  Laboratory Data: Admission on 08/06/2014  Component Date Value Ref Range Status  . Glucose-Capillary 08/06/2014 123* 70 - 99 mg/dL Final  . Glucose-Capillary 08/06/2014 165* 70 - 99 mg/dL Final  . Comment 1 08/06/2014 Notify RN   Final  . Comment 2 08/06/2014 Document in Chart   Final  . Glucose-Capillary 08/06/2014 184* 70 - 99 mg/dL Final  . WBC 08/07/2014 19.4* 4.0 - 10.5 K/uL Final  . RBC 08/07/2014 3.75* 3.87 - 5.11 MIL/uL Final  . Hemoglobin 08/07/2014 11.4* 12.0 - 15.0 g/dL Final  . HCT 08/07/2014 33.8* 36.0 - 46.0 % Final  . MCV 08/07/2014 90.1  78.0 - 100.0 fL Final  . MCH 08/07/2014 30.4  26.0 - 34.0 pg Final  . MCHC 08/07/2014 33.7  30.0 - 36.0 g/dL Final  . RDW 08/07/2014 12.8  11.5 - 15.5 % Final  . Platelets 08/07/2014 266  150 - 400 K/uL Final  . Sodium 08/07/2014 127* 135 - 145 mmol/L Final  . Potassium 08/07/2014 3.5  3.5 - 5.1 mmol/L Final  . Chloride 08/07/2014 90* 96 - 112 mmol/L Final  . CO2 08/07/2014 28  19 - 32 mmol/L Final  . Glucose, Bld 08/07/2014 194* 70 - 99 mg/dL Final  . BUN 08/07/2014 12  6 - 23 mg/dL Final  . Creatinine, Ser 08/07/2014 0.48* 0.50 - 1.10 mg/dL Final  . Calcium 08/07/2014 8.2* 8.4 -  10.5 mg/dL Final  . GFR calc non Af Amer 08/07/2014 >90  >90 mL/min Final  . GFR calc Af Amer 08/07/2014 >90  >90 mL/min Final   Comment: (NOTE) The eGFR has been calculated using the CKD EPI equation. This calculation has not been validated in all clinical situations. eGFR's persistently <90 mL/min signify possible Chronic Kidney Disease.   . Anion gap 08/07/2014 9  5 - 15 Final  . Glucose-Capillary 08/06/2014 223* 70 - 99 mg/dL Final  . Comment 1 08/06/2014 Notify RN   Final  . Comment 2 08/06/2014 Document in Chart   Final  . Glucose-Capillary 08/07/2014 207* 70 - 99 mg/dL Final  . Glucose-Capillary 08/07/2014 144* 70 - 99 mg/dL Final  . WBC 08/08/2014 19.5* 4.0 - 10.5 K/uL Final  . RBC 08/08/2014 3.76* 3.87 - 5.11 MIL/uL Final    . Hemoglobin 08/08/2014 11.5* 12.0 - 15.0 g/dL Final  . HCT 08/08/2014 34.1* 36.0 - 46.0 % Final  . MCV 08/08/2014 90.7  78.0 - 100.0 fL Final  . MCH 08/08/2014 30.6  26.0 - 34.0 pg Final  . MCHC 08/08/2014 33.7  30.0 - 36.0 g/dL Final  . RDW 08/08/2014 12.9  11.5 - 15.5 % Final  . Platelets 08/08/2014 234  150 - 400 K/uL Final  . Sodium 08/08/2014 128* 135 - 145 mmol/L Final  . Potassium 08/08/2014 3.2* 3.5 - 5.1 mmol/L Final  . Chloride 08/08/2014 88* 96 - 112 mmol/L Final  . CO2 08/08/2014 30  19 - 32 mmol/L Final  . Glucose, Bld 08/08/2014 224* 70 - 99 mg/dL Final  . BUN 08/08/2014 11  6 - 23 mg/dL Final  . Creatinine, Ser 08/08/2014 0.47* 0.50 - 1.10 mg/dL Final  . Calcium 08/08/2014 8.1* 8.4 - 10.5 mg/dL Final  . GFR calc non Af Amer 08/08/2014 >90  >90 mL/min Final  . GFR calc Af Amer 08/08/2014 >90  >90 mL/min Final   Comment: (NOTE) The eGFR has been calculated using the CKD EPI equation. This calculation has not been validated in all clinical situations. eGFR's persistently <90 mL/min signify possible Chronic Kidney Disease.   . Anion gap 08/08/2014 10  5 - 15 Final  . Glucose-Capillary 08/07/2014 196* 70 - 99 mg/dL Final  . Glucose-Capillary 08/07/2014 197* 70 - 99 mg/dL Final  . Comment 1 08/07/2014 Notify RN   Final  Hospital Outpatient Visit on 07/27/2014  Component Date Value Ref Range Status  . MRSA, PCR 07/27/2014 NEGATIVE  NEGATIVE Final  . Staphylococcus aureus 07/27/2014 NEGATIVE  NEGATIVE Final   Comment:        The Xpert SA Assay (FDA approved for NASAL specimens in patients over 16 years of age), is one component of a comprehensive surveillance program.  Test performance has been validated by [facility] for patients greater than or equal to 74 year old. It is not intended to diagnose infection nor to guide or monitor treatment. Performed at [facility]   . aPTT 07/27/2014 29  24 - 37 seconds Final  . WBC 07/27/2014 11.1* 4.0 - 10.5 K/uL  Final  . RBC 07/27/2014 4.46  3.87 - 5.11 MIL/uL Final  . Hemoglobin 07/27/2014 13.7  12.0 - 15.0 g/dL Final  . HCT 07/27/2014 40.8  36.0 - 46.0 % Final  . MCV 07/27/2014 91.5  78.0 - 100.0 fL Final  . MCH 07/27/2014 30.7  26.0 - 34.0 pg Final  . MCHC 07/27/2014 33.6  30.0 - 36.0 g/dL Final  . RDW 07/27/2014  12.9  11.5 - 15.5 % Final  . Platelets 07/27/2014 274  150 - 400 K/uL Final  . Sodium 07/27/2014 138  135 - 145 mmol/L Final  . Potassium 07/27/2014 3.5  3.5 - 5.1 mmol/L Final  . Chloride 07/27/2014 98  96 - 112 mmol/L Final  . CO2 07/27/2014 31  19 - 32 mmol/L Final  . Glucose, Bld 07/27/2014 127* 70 - 99 mg/dL Final  . BUN 07/27/2014 17  6 - 23 mg/dL Final  . Creatinine, Ser 07/27/2014 0.61  0.50 - 1.10 mg/dL Final  . Calcium 07/27/2014 9.3  8.4 - 10.5 mg/dL Final  . Total Protein 07/27/2014 7.7  6.0 - 8.3 g/dL Final  . Albumin 07/27/2014 3.9  3.5 - 5.2 g/dL Final  . AST 07/27/2014 19  0 - 37 U/L Final  . ALT 07/27/2014 15  0 - 35 U/L Final  . Alkaline Phosphatase 07/27/2014 79  39 - 117 U/L Final  . Total Bilirubin 07/27/2014 0.4  0.3 - 1.2 mg/dL Final  . GFR calc non Af Amer 07/27/2014 87* >90 mL/min Final  . GFR calc Af Amer 07/27/2014 >90  >90 mL/min Final   Comment: (NOTE) The eGFR has been calculated using the CKD EPI equation. This calculation has not been validated in all clinical situations. eGFR's persistently <90 mL/min signify possible Chronic Kidney Disease.   . Anion gap 07/27/2014 9  5 - 15 Final  . Prothrombin Time 07/27/2014 13.3  11.6 - 15.2 seconds Final  . INR 07/27/2014 1.00  0.00 - 1.49 Final  . Color, Urine 07/27/2014 YELLOW  YELLOW Final  . APPearance 07/27/2014 CLEAR  CLEAR Final  . Specific Gravity, Urine 07/27/2014 1.009  1.005 - 1.030 Final  . pH 07/27/2014 5.5  5.0 - 8.0 Final  . Glucose, UA 07/27/2014 NEGATIVE  NEGATIVE mg/dL Final  . Hgb urine dipstick 07/27/2014 NEGATIVE  NEGATIVE Final  . Bilirubin Urine 07/27/2014 NEGATIVE  NEGATIVE  Final  . Ketones, ur 07/27/2014 NEGATIVE  NEGATIVE mg/dL Final  . Protein, ur 07/27/2014 NEGATIVE  NEGATIVE mg/dL Final  . Urobilinogen, UA 07/27/2014 0.2  0.0 - 1.0 mg/dL Final  . Nitrite 07/27/2014 NEGATIVE  NEGATIVE Final  . Leukocytes, UA 07/27/2014 SMALL* NEGATIVE Final  . Squamous Epithelial / LPF 07/27/2014 RARE  RARE Final  . WBC, UA 07/27/2014 11-20  <3 WBC/hpf Final  . RBC / HPF 07/27/2014 0-2  <3 RBC/hpf Final  . Bacteria, UA 07/27/2014 RARE  RARE Final  . Edwina Barth 07/27/2014 MUCOUS PRESENT   Final  . Transfuse no blood products 07/27/2014 Darlene Reed,rn   Final     X-Rays:No results found.  EKG: Orders placed or performed in visit on 07/06/14  . EKG 12-Lead     Hospital Course: NAIARA LOMBARDOZZI is a 74 y.o. who was admitted to [facility]. They were brought to the operating room on 08/06/2014 and underwent Procedure(s): LEFT TOTAL KNEE ARTHROPLASTY.  Patient tolerated the procedure well and was later transferred to the recovery room and then to the orthopaedic floor for postoperative care.  They were given PO and IV analgesics for pain control following their surgery.  They were given 24 hours of postoperative antibiotics of  Anti-infectives    Start     Dose/Rate Route Frequency Ordered Stop   08/06/14 1830  ceFAZolin (ANCEF) IVPB 2 g/50 mL premix     2 g 100 mL/hr over 30 Minutes Intravenous Every 6 hours 08/06/14 1503 08/07/14 0024   08/06/14 7062  ceFAZolin (ANCEF) IVPB 2 g/50 mL premix     2 g 100 mL/hr over 30 Minutes Intravenous On call to O.R. 08/06/14 0998 08/06/14 1225     and started on DVT prophylaxis in the form of Xarelto.   PT and OT were ordered for total joint protocol.  Discharge planning consulted to help with postop disposition and equipment needs.  Patient had a decent night on the evening of surgery.  They started to get up OOB with therapy on day one.  Dr. Wynelle Link stopped the CPM on day one.  Hemovac drain was pulled without difficulty.   Continued to work with therapy into day two.  Dressing was changed on day two and the incision was healing well.  Patient was having some low sats.  CXR was check and encouraged to use the I.S.  CXR IMPRESSION:  No acute cardiopulmonary disease.  By day three, the patient had progressed with therapy and meeting their goals.  O2 sats had improved.  Incision was healing well.  Patient was seen in rounds and was ready to go home.  Discharge home with home health Diet - Cardiac diet and Diabetic diet Follow up - in 2 weeks Activity - WBAT Disposition - Home Condition Upon Discharge - Stable D/C Meds - See DC Summary DVT Prophylaxis - Xarelto   Discharge Instructions    Call MD / Call 911    Complete by:  As directed   If you experience chest pain or shortness of breath, CALL 911 and be transported to the hospital emergency room.  If you develope a fever above 101 F, pus (white drainage) or increased drainage or redness at the wound, or calf pain, call your surgeon's office.     Change dressing    Complete by:  As directed   Change dressing daily with sterile 4 x 4 inch gauze dressing and apply TED hose. Do not submerge the incision under water.     Constipation Prevention    Complete by:  As directed   Drink plenty of fluids.  Prune juice may be helpful.  You may use a stool softener, such as Colace (over the counter) 100 mg twice a day.  Use MiraLax (over the counter) for constipation as needed.     Diet - low sodium heart healthy    Complete by:  As directed      Discharge instructions    Complete by:  As directed   Pick up stool softner and laxative for home use following surgery while on pain medications. Do not submerge incision under water. Please use good hand washing techniques while changing dressing each day. May shower starting three days after surgery. Please use a clean towel to pat the incision dry following showers. Continue to use ice for pain and swelling after  surgery. Do not use any lotions or creams on the incision until instructed by your surgeon.  Take Xarelto for two and a half more weeks, then discontinue Xarelto. Once the patient has completed the blood thinner regimen, then take a Baby 81 mg Aspirin daily for three more weeks.  Postoperative Constipation Protocol  Constipation - defined medically as fewer than three stools per week and severe constipation as less than one stool per week.  One of the most common issues patients have following surgery is constipation.  Even if you have a regular bowel pattern at home, your normal regimen is likely to be disrupted due to multiple reasons following surgery.  Combination of  anesthesia, postoperative narcotics, change in appetite and fluid intake all can affect your bowels.  In order to avoid complications following surgery, here are some recommendations in order to help you during your recovery period.  Colace (docusate) - Pick up an over-the-counter form of Colace or another stool softener and take twice a day as long as you are requiring postoperative pain medications.  Take with a full glass of water daily.  If you experience loose stools or diarrhea, hold the colace until you stool forms back up.  If your symptoms do not get better within 1 week or if they get worse, check with your doctor.  Dulcolax (bisacodyl) - Pick up over-the-counter and take as directed by the product packaging as needed to assist with the movement of your bowels.  Take with a full glass of water.  Use this product as needed if not relieved by Colace only.   MiraLax (polyethylene glycol) - Pick up over-the-counter to have on hand.  MiraLax is a solution that will increase the amount of water in your bowels to assist with bowel movements.  Take as directed and can mix with a glass of water, juice, soda, coffee, or tea.  Take if you go more than two days without a movement. Do not use MiraLax more than once per day. Call your  doctor if you are still constipated or irregular after using this medication for 7 days in a row.  If you continue to have problems with postoperative constipation, please contact the office for further assistance and recommendations.  If you experience "the worst abdominal pain ever" or develop nausea or vomiting, please contact the office immediatly for further recommendations for treatment.     Do not put a pillow under the knee. Place it under the heel.    Complete by:  As directed      Do not sit on low chairs, stoools or toilet seats, as it may be difficult to get up from low surfaces    Complete by:  As directed      Driving restrictions    Complete by:  As directed   No driving until released by the physician.     Increase activity slowly as tolerated    Complete by:  As directed      Lifting restrictions    Complete by:  As directed   No lifting until released by the physician.     Patient may shower    Complete by:  As directed   You may shower without a dressing once there is no drainage.  Do not wash over the wound.  If drainage remains, do not shower until drainage stops.     TED hose    Complete by:  As directed   Use stockings (TED hose) for 3 weeks on both leg(s).  You may remove them at night for sleeping.     Weight bearing as tolerated    Complete by:  As directed   Laterality:  left  Extremity:  Lower            Medication List    STOP taking these medications        CALCIUM + D PO     multivitamin with minerals Tabs tablet     oxyCODONE-acetaminophen 5-325 MG per tablet  Commonly known as:  PERCOCET     sodium chloride 0.9 % SOLN with morphine 1 MG/ML SOLN 10 mg, ropivacaine (PF) 5 MG/ML SOLN 0.125 %  Vitamin D3 5000 UNITS Tabs      TAKE these medications        accu-chek soft touch lancets  Use as instructed to check blood sugar     amLODipine 10 MG tablet  Commonly known as:  NORVASC  Take 1 tablet (10 mg total) by mouth daily.      anastrozole 1 MG tablet  Commonly known as:  ARIMIDEX  TAKE ONE TABLET BY MOUTH ONCE DAILY     diazepam 5 MG tablet  Commonly known as:  VALIUM  Take 5 mg by mouth every 6 (six) hours as needed for anxiety.     diphenhydramine-acetaminophen 25-500 MG Tabs  Commonly known as:  TYLENOL PM  Take 1 tablet by mouth at bedtime as needed (for pain /sleep).     glucose blood test strip  Commonly known as:  ACCU-CHEK AVIVA  Test once per day     HYDROcodone-acetaminophen 7.5-325 MG per tablet  Commonly known as:  NORCO  Take 1-2 tablets by mouth every 4 (four) hours as needed for moderate pain.     lisinopril-hydrochlorothiazide 20-25 MG per tablet  Commonly known as:  PRINZIDE,ZESTORETIC  Take 1 tablet by mouth at bedtime.     metFORMIN 1000 MG tablet  Commonly known as:  GLUCOPHAGE  1023m (1 tablet) twice daily     methocarbamol 500 MG tablet  Commonly known as:  ROBAXIN  Take 1 tablet (500 mg total) by mouth every 6 (six) hours as needed for muscle spasms.     oxybutynin 5 MG tablet  Commonly known as:  DITROPAN  Take 1 tablet (5 mg total) by mouth 2 (two) times daily.     pramipexole 1 MG tablet  Commonly known as:  MIRAPEX  TAKE ONE TABLET BY MOUTH TWICE DAILY     rivaroxaban 10 MG Tabs tablet  Commonly known as:  XARELTO  - Take 1 tablet (10 mg total) by mouth daily with breakfast. Take Xarelto for two and a half more weeks, then discontinue Xarelto.  - Once the patient has completed the blood thinner regimen, then take a Baby 81 mg Aspirin daily for three more weeks.     sitaGLIPtin 100 MG tablet  Commonly known as:  JANUVIA  Take 1 tablet (100 mg total) by mouth daily.     traMADol 50 MG tablet  Commonly known as:  ULTRAM  Take 1-2 tablets (50-100 mg total) by mouth every 6 (six) hours as needed (mild pain).           Follow-up Information    [facility]   Why:  home [facility] physical therapy   Contact information:   [email or web address] [phone]      Follow up with AGearlean Alf MD. Schedule an appointment as soon as possible for a visit in 2 weeks.   Specialty:  Orthopedic Surgery   Why:  Call office at [phone] to setup appointment with Dr. AWynelle Linkin two weeks.   Contact information:   [phone]      Signed: DArlee Muslim PA-C Orthopaedic Surgery 08/08/2014, 6:54 AM

## 2014-08-08 NOTE — Progress Notes (Signed)
OT Cancellation Note  Patient Details Name: RASHIKA BETTES MRN: [id number] DOB: [birth year]   Cancelled Treatment:    Reason Eval/Treat Not Completed: Other (comment) Pt going to test when OT arrived earlier. PT finishing with pt when OT checked back later and PT reports O2 sats dropped during ambulation on O2. Will try back at a later time.  Jules Schick  [phone] 08/08/2014, 11:53 AM

## 2014-08-09 ENCOUNTER — Inpatient Hospital Stay (HOSPITAL_COMMUNITY): Payer: Commercial Managed Care - HMO

## 2014-08-09 ENCOUNTER — Encounter (HOSPITAL_COMMUNITY): Payer: Self-pay | Admitting: Radiology

## 2014-08-09 LAB — GLUCOSE, CAPILLARY
GLUCOSE-CAPILLARY: 161 mg/dL — AB (ref 70–99)
GLUCOSE-CAPILLARY: 148 mg/dL — AB (ref 70–99)
GLUCOSE-CAPILLARY: 166 mg/dL — AB (ref 70–99)
Glucose-Capillary: 166 mg/dL — ABNORMAL HIGH (ref 70–99)

## 2014-08-09 LAB — BASIC METABOLIC PANEL
Anion gap: 11 (ref 5–15)
BUN: 13 mg/dL (ref 6–23)
CALCIUM: 8.2 mg/dL — AB (ref 8.4–10.5)
CO2: 28 mmol/L (ref 19–32)
CREATININE: 0.44 mg/dL — AB (ref 0.50–1.10)
Chloride: 90 mmol/L — ABNORMAL LOW (ref 96–112)
GFR calc non Af Amer: >90 mL/min (ref >90)
Glucose, Bld: 180 mg/dL — ABNORMAL HIGH (ref 70–99)
Potassium: 4.4 mmol/L (ref 3.5–5.1)
Sodium: 129 mmol/L — ABNORMAL LOW (ref 135–145)

## 2014-08-09 LAB — CBC
HCT: 31.2 % — ABNORMAL LOW (ref 36.0–46.0)
Hemoglobin: 10.5 g/dL — ABNORMAL LOW (ref 12.0–15.0)
MCH: 30.7 pg (ref 26.0–34.0)
MCHC: 33.7 g/dL (ref 30.0–36.0)
MCV: 91.2 fL (ref 78.0–100.0)
PLATELETS: 221 10*3/uL (ref 150–400)
RBC: 3.42 MIL/uL — ABNORMAL LOW (ref 3.87–5.11)
RDW: 13 % (ref 11.5–15.5)
WBC: 18.1 10*3/uL — ABNORMAL HIGH (ref 4.0–10.5)

## 2014-08-09 MED ORDER — IOHEXOL 350 MG/ML SOLN
100.0000 mL | Freq: Once | INTRAVENOUS | Status: AC | PRN
  Administered 2014-08-09: 100 mL via INTRAVENOUS

## 2014-08-09 NOTE — Progress Notes (Signed)
LATE ENTRY NOTE Date of Service of Visit - Wednesday 08/08/2014  Subjective: 2 Days Post-Op Procedure(s) (LRB): LEFT TOTAL KNEE ARTHROPLASTY (Left) Patient reports pain as mild.   Patient seen in rounds by Dr. Wynelle Link. Patient is having some low sats. Plan is to go Home after hospital stay.  Objective: Vital signs in last 24 hours: Temp:  [97.9 F (36.6 C)-98.2 F (36.8 C)] 98 F (36.7 C) (03/24 0555) Pulse Rate:  [68-102] 68 (03/24 0555) Resp:  [16-20] 16 (03/24 0800) BP: (125-145)/(52-80) 139/52 mmHg (03/24 0555) SpO2:  [83 %-97 %] 97 % (03/24 0555)  Intake/Output from previous day:  Intake/Output Summary (Last 24 hours) at 08/09/14 0847 Last data filed at 08/09/14 0610  Gross per 24 hour  Intake    300 ml  Output   1000 ml  Net   -700 ml    Labs:  Recent Labs  08/07/14 0500 08/08/14 0435 08/09/14 0458  HGB 11.4* 11.5* 10.5*    Recent Labs  08/08/14 0435 08/09/14 0458  WBC 19.5* 18.1*  RBC 3.76* 3.42*  HCT 34.1* 31.2*  PLT 234 221    Recent Labs  08/08/14 0435 08/09/14 0458  NA 128* PENDING  K 3.2* PENDING  CL 88* PENDING  CO2 30 PENDING  BUN 11 13  CREATININE 0.47* 0.44*  GLUCOSE 224* 180*  CALCIUM 8.1* PENDING   No results for input(s): LABPT, INR in the last 72 hours.  EXAM General - Patient is Alert and Appropriate Extremity - Neurovascular intact Sensation intact distally Dressing/Incision - clean, dry, no drainage Motor Function - intact, moving foot and toes well on exam.   Past Medical History  Diagnosis Date  . Hypertension   . Arthritis   . Hearing loss   . Cancer     right breast   . History of radiation therapy 04/29/10-05/05/10    right breast 34Gy/65fx  . Actinic keratosis 07/18/2008    Qualifier: Diagnosis of  By: Arnoldo Morale MD, Balinda Quails   . Closed fracture of one or more phalanges of foot 03/29/2008    Qualifier: Diagnosis of  By: Arnoldo Morale MD, Balinda Quails   . No blood products (Jehovah Witness)  09/11/2013  . RESTLESS LEGS  SYNDROME 05/05/2007    Qualifier: Diagnosis of  By: Gwenette Greet MD, Armando Reichert   . Radiation 05/05/2010    Right Breast mammosite  . Sleep apnea   . History of environmental allergies   . Diabetes mellitus     type 2  . Anxiety     occasional  . History of kidney stones 20 years ago  . Falls frequently     "can not pick left leg up"    Assessment/Plan: 2 Days Post-Op Procedure(s) (LRB): LEFT TOTAL KNEE ARTHROPLASTY (Left) Active Problems:   OA (osteoarthritis) of knee  Estimated body mass index is 36.25 kg/(m^2) as calculated from the following:   Height as of this encounter: 5' 1.5" (1.562 m).   Weight as of this encounter: 88.451 kg (195 lb). Up with therapy Check CXR and encourage I.S.  DVT Prophylaxis - Xarelto Weight-Bearing as tolerated to left leg Keep today and monitor.  Arlee Muslim, PA-C Orthopaedic Surgery 08/09/2014, 8:47 AM

## 2014-08-09 NOTE — Progress Notes (Signed)
Subjective: 3 Days Post-Op Procedure(s) (LRB): LEFT TOTAL KNEE ARTHROPLASTY (Left) Patient reports pain as mild.   Patient seen in rounds with Dr. Wynelle Link.  Doing better today.  More alert this morning.  Sitting up in bed using I.S.  O2 sats or 97% on room air this morning.  Likely due to meds yesterday.  Looking better today and felt ready to home if does well with therapy. Patient is well, but has had some minor complaints of pain in the knee, requiring pain medications Patient is ready to go home following therapy if meets goals  Objective: Vital signs in last 24 hours: Temp:  [97.9 F (36.6 C)-98.2 F (36.8 C)] 98 F (36.7 C) (03/24 0555) Pulse Rate:  [68-102] 68 (03/24 0555) Resp:  [16-20] 16 (03/24 0800) BP: (125-145)/(52-80) 139/52 mmHg (03/24 0555) SpO2:  [83 %-97 %] 97 % (03/24 0555)  Intake/Output from previous day:  Intake/Output Summary (Last 24 hours) at 08/09/14 0843 Last data filed at 08/09/14 0610  Gross per 24 hour  Intake    300 ml  Output   1400 ml  Net  -1100 ml    Labs:  Recent Labs  08/07/14 0500 08/08/14 0435 08/09/14 0458  HGB 11.4* 11.5* 10.5*    Recent Labs  08/08/14 0435 08/09/14 0458  WBC 19.5* 18.1*  RBC 3.76* 3.42*  HCT 34.1* 31.2*  PLT 234 221    Recent Labs  08/08/14 0435 08/09/14 0458  NA 128* PENDING  K 3.2* PENDING  CL 88* PENDING  CO2 30 PENDING  BUN 11 13  CREATININE 0.47* 0.44*  GLUCOSE 224* 180*  CALCIUM 8.1* PENDING   No results for input(s): LABPT, INR in the last 72 hours.  EXAM: General - Patient is Alert, Appropriate and Oriented Extremity - Neurovascular intact Sensation intact distally Incision - clean, dry, no drainage, healing Motor Function - intact, moving foot and toes well on exam.   Assessment/Plan: 3 Days Post-Op Procedure(s) (LRB): LEFT TOTAL KNEE ARTHROPLASTY (Left) Procedure(s) (LRB): LEFT TOTAL KNEE ARTHROPLASTY (Left) Past Medical History  Diagnosis Date  . Hypertension   .  Arthritis   . Hearing loss   . Cancer     right breast   . History of radiation therapy 04/29/10-05/05/10    right breast 34Gy/10fx  . Actinic keratosis 07/18/2008    Qualifier: Diagnosis of  By: Arnoldo Morale MD, Balinda Quails   . Closed fracture of one or more phalanges of foot 03/29/2008    Qualifier: Diagnosis of  By: Arnoldo Morale MD, Balinda Quails   . No blood products (Jehov[employer] Witness)  09/11/2013  . RESTLESS LEGS SYNDROME 05/05/2007    Qualifier: Diagnosis of  By: Gwenette Greet MD, Armando Reichert   . Radiation 05/05/2010    Right Breast mammosite  . Sleep apnea   . History of environmental allergies   . Diabetes mellitus     type 2  . Anxiety     occasional  . History of kidney stones 20 years ago  . Falls frequently     "can not pick left leg up"   Active Problems:   OA (osteoarthritis) of knee  Estimated body mass index is 36.25 kg/(m^2) as calculated from the following:   Height as of this encounter: 5' 1.5" (1.562 m).   Weight as of this encounter: 88.451 kg (195 lb). Up with therapy Discharge home with home health Diet - Cardiac diet and Diabetic diet Follow up - in 2 weeks Activity - WBAT Disposition - Home Condition Upon  Discharge - Stable D/C Meds - See DC Summary DVT Prophylaxis - Xarelto  Arlee Muslim, PA-C Orthopaedic Surgery 08/09/2014, 8:43 AM

## 2014-08-09 NOTE — Progress Notes (Signed)
Clinical Social Work Department BRIEF PSYCHOSOCIAL ASSESSMENT 08/09/2014  Patient:  MARGRIT, MINNER     Account Number:  [id number]     Admit date:  08/06/2014  Clinical Social Worker:  Lacie Scotts  Date/Time:  08/09/2014 01:53 PM  Referred by:  RN  Date Referred:  08/09/2014 Referred for  SNF Placement   Other Referral:   Interview type:  Family Other interview type:    PSYCHOSOCIAL DATA Living Status:  HUSBAND Admitted from facility:   Level of care:   Primary support name:  [name] Primary support relationship to patient:  SPOUSE Degree of support available:   supportive    CURRENT CONCERNS Current Concerns  Post-Acute Placement   Other Concerns:    SOCIAL WORK ASSESSMENT / PLAN Pt is a 74 yr old female living at home prior to hospitalization. CSW met with spouse ( pt receiving care ) to assist with d/c planning. This is a planned admission. PN reviewed. PT notes home vs SNF on 3/23. Spouse is planning on d/c home with [facility] services following hospital d/c. RNCM is assisting with d/c planning. Spouse is aware csw is available to assist with SNF placement if needed.   Assessment/plan status:  Psychosocial Support/Ongoing Assessment of Needs Other assessment/ plan:   Information/referral to community resources:   Placement from home option reviewed.    PATIENT'S/FAMILY'S RESPONSE TO PLAN OF CARE: " We're planning for home. " Encouraged spouse to contact csw if plan changes and assistance is needed. " I  know it won't be easy."  Support provided.   Werner Lean LCSW [phone]

## 2014-08-09 NOTE — Progress Notes (Signed)
Physical Therapy Treatment Patient Details Name: [name] MRN: [id number] DOB: 18-Jan-1941 Today's Date: 08/09/2014    History of Present Illness LTKA, post op low sats    PT Comments    Per spouse, patient more alert earlier today. Now lethargic but rouses when stimulated. Pt on 2 l of )2 when PT started. sats 945, HR 90. Hooked up O2 but did did not place on any liters to ambulate. sats low 89 % during ambulation, HR 110. Noted wheezing when returned to room after walking. Left O2 off for several minutes, patient dozing again, sats  Down to 86%, replaced O2  To 2 liters with sats back to 94%. RN notified who states sats 79% earlier on RA and will notify MD. Note order for  Spiral CT to R/O PE.  Follow Up Recommendations  Home health PT;Supervision/Assistance - 24 hour     Equipment Recommendations  None recommended by PT    Recommendations for Other Services       Precautions / Restrictions Precautions Precautions: Fall;Knee Precaution Comments: monitor sats and HR, has required O2.    Mobility  Bed Mobility               General bed mobility comments: pt in chair  Transfers Overall transfer level: Needs assistance Equipment used: Rolling walker (2 wheeled) Transfers: Sit to/from Stand Sit to Stand: Min guard         General transfer comment: cues for  and posture, cues for deep breaths, lifting assist to power up.  Ambulation/Gait Ambulation/Gait assistance: Min assist;+2 safety/equipment Ambulation Distance (Feet): 90 Feet   Gait Pattern/deviations: Step-to pattern;Step-through pattern;Antalgic Gait velocity: slow   General Gait Details: slow  pace, monitored oxygen   Stairs            Wheelchair Mobility    Modified Rankin (Stroke Patients Only)       Balance                                    Cognition Arousal/Alertness: Lethargic Behavior During Therapy: Flat affect Overall Cognitive Status: Within Functional  Limits for tasks assessed                      Exercises Total Joint Exercises Ankle Circles/Pumps: AROM;Both;10 reps;Supine Quad Sets: AAROM;Left;Supine;5 reps Towel Squeeze: AAROM;10 reps;Supine Short Arc Quad: AAROM;Left;10 reps;Supine Heel Slides: AAROM;10 reps;Supine;Left Hip ABduction/ADduction: AAROM;Left;10 reps;Supine Straight Leg Raises: AAROM;10 reps;Left    General Comments        Pertinent Vitals/Pain Pain Score: 3  Pain Location: L knee Pain Descriptors / Indicators: Aching;Discomfort Pain Intervention(s): Monitored during session;Premedicated before session;Ice applied    Home Living Family/patient expects to be discharged to:: Private residence Living Arrangements: Spouse/significant other;Other relatives Available Help at Discharge: Family Type of Home: House Home Access: Stairs to enter Entrance Stairs-Rails: Right;Left Home Layout: One level Home Equipment: Environmental consultant - 2 wheels      Prior Function Level of Independence: Independent          PT Goals (current goals can now be found in the care plan section) Acute Rehab PT Goals Patient Stated Goal: get home Progress towards PT goals: Progressing toward goals    Frequency  7X/week    PT Plan Current plan remains appropriate    Co-evaluation             End of Session Equipment Utilized During  Treatment: Gait belt;Oxygen (did not turn O2 on but monitored) Activity Tolerance: Patient tolerated treatment well;Patient limited by fatigue Patient left: in chair;with call bell/phone within reach;with family/visitor present     Time: 1024-1110 PT Time Calculation (min) (ACUTE ONLY): 46 min  Charges:  $Gait Training: 23-37 mins $Therapeutic Exercise: 8-22 mins                    G Codes:      Claretha Cooper 08/09/2014, 12:45 PM Tresa Endo PT [phone]

## 2014-08-09 NOTE — Evaluation (Signed)
Occupational Therapy Evaluation Patient Details Name: [name] MRN: [id number] DOB: [birth year] Today's Date: 08/09/2014    History of Present Illness LTKA, post op low sats   Clinical Impression   Pt is s/p TKA resulting in the deficits listed below (see OT Problem List).  Pt will benefit from skilled OT to increase their safety and independence with ADL and functional mobility for ADL to facilitate discharge to venue listed below.        Follow Up Recommendations  Home health OT;Supervision/Assistance - 24 hour    Equipment Recommendations  3 in 1 bedside comode       Precautions / Restrictions Precautions Precautions: Fall;Knee Precaution Comments: monitor sats and HR, has required O2.      Mobility Bed Mobility               General bed mobility comments: pt in chair  Transfers Overall transfer level: Needs assistance Equipment used: Rolling walker (2 wheeled) Transfers: Sit to/from Stand Sit to Stand: Min guard         General transfer comment: cues for  and posture, cues for deep breaths, lifting assist to power up.         ADL Overall ADL's : Needs assistance/impaired                         Toilet Transfer: Minimal assistance;Comfort height toilet;Ambulation;RW   Toileting- Clothing Manipulation and Hygiene: Minimal assistance;Sit to/from stand;Cueing for sequencing;Cueing for safety       Functional mobility during ADLs: Minimal assistance General ADL Comments: increased time and VC to take deep breaths               Pertinent Vitals/Pain Pain Score: 3  Pain Location: L knee Pain Descriptors / Indicators: Aching;Discomfort Pain Intervention(s): Monitored during session;Premedicated before session;Ice applied     Hand Dominance     Extremity/Trunk Assessment Upper Extremity Assessment Upper Extremity Assessment: Generalized weakness           Communication Communication Communication: No difficulties    Cognition Arousal/Alertness: Lethargic Behavior During Therapy: Flat affect Overall Cognitive Status: Within Functional Limits for tasks assessed                     General Comments   pt needed VC to deep breathe            Home Living Family/patient expects to be discharged to:: Private residence Living Arrangements: Spouse/significant other;Other relatives Available Help at Discharge: Family Type of Home: House Home Access: Stairs to enter [employer] of Steps: 2 Entrance Stairs-Rails: Right;Left Home Layout: One level               Home Equipment: Environmental consultant - 2 wheels          Prior Functioning/Environment Level of Independence: Independent             OT Diagnosis: Generalized weakness   OT Problem List: Decreased strength;Decreased activity tolerance;Cardiopulmonary status limiting activity   OT Treatment/Interventions: Self-care/ADL training;DME and/or AE instruction;Patient/family education    OT Goals(Current goals can be found in the care plan section) Acute Rehab OT Goals Patient Stated Goal: get home OT Goal Formulation: With patient Time For Goal Achievement: 08/23/14 Potential to Achieve Goals: Good ADL Goals Pt Will Perform Lower Body Dressing: with supervision;sit to/from stand Pt Will Transfer to Toilet: ambulating;regular height toilet Pt Will Perform Toileting - Clothing Manipulation and hygiene: with supervision;sit to/from stand  OT Frequency: Min 2X/week              End of Session Equipment Utilized During Treatment: Surveyor, mining Communication: Mobility status  Activity Tolerance: Patient tolerated treatment well Patient left: Other (comment) (in rest room- RN aware)   Time: 1145-1200 OT Time Calculation (min): 15 min Charges:  OT General Charges $OT Visit: 1 Procedure OT Evaluation $Initial OT Evaluation Tier I: 1 Procedure OT Treatments $Self Care/Home Management : 8-22 mins G-Codes:     Betsy Pries 08/09/2014, 12:41 PM

## 2014-08-10 LAB — GLUCOSE, CAPILLARY
GLUCOSE-CAPILLARY: 158 mg/dL — AB (ref 70–99)
Glucose-Capillary: 144 mg/dL — ABNORMAL HIGH (ref 70–99)

## 2014-08-10 NOTE — Progress Notes (Signed)
Occupational Therapy Treatment Patient Details Name: [name] MRN: [id number] DOB: 1940/09/09 Today's Date: 08/10/2014    History of present illness LTKA, post op low sats   OT comments  Pt supposed to d/c home today. Discussed SNF option but pt refuses SNF and wants to d/c home. Advised pt and spouse to have additional help available for self care tasks such as bathing and dressing as pt does get sleepy with tasks and spouse is having to assist with balance support as well as clothing management. Pt and spouse states there are other family members that can help with ADL and educated on how they can provide assist.  Pt's O2 sats on RA at 93% and HR 82 at rest. Checked O2 during dressing activity and sats 92% on RA. Pt completed tasks and checked again as pt starting to get more drowsy and falling asleep. O2 sats on RA 85% and hovering around 85-87%. Replaced O2 at 1L and sats up to 96%. Nursing made aware.   Recommend HHOT to progress ADL independence.    Follow Up Recommendations  Home health OT;Supervision/Assistance - 24 hour (pt refuses SNF)    Equipment Recommendations  3 in 1 bedside comode    Recommendations for Other Services      Precautions / Restrictions Precautions Precautions: Fall;Knee Precaution Comments: monitor sats and HR, has required O2 to keep sats > 90 % Required Braces or Orthoses: Knee Immobilizer - Left Knee Immobilizer - Left: Discontinue once straight leg raise with < 10 degree lag Restrictions Weight Bearing Restrictions: Yes LLE Weight Bearing: Weight bearing as tolerated       Mobility Bed Mobility                  Transfers Overall transfer level: Needs assistance Equipment used: Rolling walker (2 wheeled) Transfers: Sit to/from Stand Sit to Stand: Min assist         General transfer comment: cues for hand placement.    Balance                                   ADL                       Lower  Body Dressing: Maximal assistance;Sit to/from stand                 General ADL Comments: Educated pt and spouse on LB dressing techniques. Pt is not interested in AE so educated spouse on how to assist with donning KI and when to wear. He practiced hands on with donning clothing over LEs and assisting also with donning KI. Pt needed ongoing verbal cues for how to support L LE duirng dressing task and correct placement of KI. Pt reports 5/10 pain with task. Pt intermittently getting sleepy and closing eyes during session. Pulled up underwear and pants as high as possible in sitting and then stood to pull up pants with holding to walker and spouse assisting. Educated spouse on using a belt to put around pt and help with balance support and recommendation for a second person to assist him and pt with dressing so that one person can provide balance support with belt use and the other can assist with clothing management. Discussed SNF option but pt and spouse refuse. Expressed importance of KI until able to SLR. Also discussed 3in1 option which pt initially declining to have one  ordered but feel it will be helpful if pt not able to transfer into bathroom  or needs for night time use. Pt now agreeable. She would also benefit from bilateral armrests of potty chair as she doesnt have much UE support at home next to toilet.       Vision                     Perception     Praxis      Cognition   Behavior During Therapy: Flat affect Overall Cognitive Status: Within Functional Limits for tasks assessed                       Extremity/Trunk Assessment               Exercises     Shoulder Instructions       General Comments      Pertinent Vitals/ Pain       Pain Assessment: 0-10 Pain Score: 3  Pain Location: L knee Pain Descriptors / Indicators: Discomfort Pain Intervention(s): Premedicated before session;Monitored during session  Home Living                                           Prior Functioning/Environment              Frequency Min 2X/week     Progress Toward Goals  OT Goals(current goals can now be found in the care plan section)  Progress towards OT goals: Progressing toward goals     Plan Discharge plan remains appropriate    Co-evaluation                 End of Session Equipment Utilized During Treatment: Rolling walker;Left knee immobilizer   Activity Tolerance Patient limited by lethargy   Patient Left in chair;with call bell/phone within reach;with family/visitor present   Nurse Communication          Time: 2876-8115 OT Time Calculation (min): 54 min  Charges: OT General Charges $OT Visit: 1 Procedure OT Treatments $Self Care/Home Management : 23-37 mins $Therapeutic Activity: 23-37 mins  Jules Schick  [phone] 08/10/2014, 12:08 PM

## 2014-08-10 NOTE — Progress Notes (Signed)
   Subjective: 4 Days Post-Op Procedure(s) (LRB): LEFT TOTAL KNEE ARTHROPLASTY (Left) Patient reports pain as mild.   Patient seen in rounds with Dr. Wynelle Link. Patient is well, and has had no acute complaints or problems Patient is ready to go home today. Pulmonary workup yesterday was negative.  Objective: Vital signs in last 24 hours: Temp:  [98 F (36.7 C)-98.3 F (36.8 C)] 98 F (36.7 C) (03/25 0425) Pulse Rate:  [78-94] 78 (03/25 0425) Resp:  [16-20] 20 (03/25 0425) BP: (137-164)/(58-83) 148/74 mmHg (03/25 0425) SpO2:  [79 %-95 %] 91 % (03/25 0425)  Intake/Output from previous day:  Intake/Output Summary (Last 24 hours) at 08/10/14 0741 Last data filed at 08/10/14 0500  Gross per 24 hour  Intake    720 ml  Output   1226 ml  Net   -506 ml   Labs:  Recent Labs  08/08/14 0435 08/09/14 0458  HGB 11.5* 10.5*    Recent Labs  08/08/14 0435 08/09/14 0458  WBC 19.5* 18.1*  RBC 3.76* 3.42*  HCT 34.1* 31.2*  PLT 234 221    Recent Labs  08/08/14 0435 08/09/14 0458  NA 128* 129*  K 3.2* 4.4  CL 88* 90*  CO2 30 28  BUN 11 13  CREATININE 0.47* 0.44*  GLUCOSE 224* 180*  CALCIUM 8.1* 8.2*   No results for input(s): LABPT, INR in the last 72 hours.  EXAM: General - Patient is Alert, Appropriate and Oriented Extremity - Neurovascular intact Sensation intact distally Dorsiflexion/Plantar flexion intact Incision - clean, dry, no drainage Motor Function - intact, moving foot and toes well on exam.   Assessment/Plan: 4 Days Post-Op Procedure(s) (LRB): LEFT TOTAL KNEE ARTHROPLASTY (Left) Procedure(s) (LRB): LEFT TOTAL KNEE ARTHROPLASTY (Left) Past Medical History  Diagnosis Date  . Hypertension   . Arthritis   . Hearing loss   . History of radiation therapy 04/29/10-05/05/10    right breast 34Gy/26fx  . Actinic keratosis 07/18/2008    Qualifier: Diagnosis of  By: Arnoldo Morale MD, Balinda Quails   . Closed fracture of one or more phalanges of foot 03/29/2008   Qualifier: Diagnosis of  By: Arnoldo Morale MD, Balinda Quails   . No blood products (Jehovah Witness)  09/11/2013  . RESTLESS LEGS SYNDROME 05/05/2007    Qualifier: Diagnosis of  By: Gwenette Greet MD, Armando Reichert   . Radiation 05/05/2010    Right Breast mammosite  . Sleep apnea   . History of environmental allergies   . Diabetes mellitus     type 2  . Anxiety     occasional  . History of kidney stones 20 years ago  . Falls frequently     "can not pick left leg up"  . Cancer dx'd 2012    right breast --xrt   Active Problems:   OA (osteoarthritis) of knee  Estimated body mass index is 36.25 kg/(m^2) as calculated from the following:   Height as of this encounter: 5' 1.5" (1.562 m).   Weight as of this encounter: 88.451 kg (195 lb). Up with therapy Discharge home with home health Diet - Diabetic diet Follow up - in 2 weeks Activity - WBAT Disposition - Home Condition Upon Discharge - Stable D/C Meds - See DC Summary DVT Prophylaxis - Xarelto  Arlee Muslim, PA-C Orthopaedic Surgery 08/10/2014, 7:41 AM

## 2014-08-10 NOTE — Progress Notes (Signed)
Physical Therapy Treatment Patient Details Name: LEANNY MOECKEL MRN: [id number] DOB: [birth year] Today's Date: 08/10/2014    History of Present Illness LTKA, post op low sats    PT Comments    Patients sats on RA 85%, dozing.  sats on 1 liter prior to ambulation 95%. Patient  ambulated x 120' with RW on RA, sats 87%, HR 95. RN aware.  Patient is mobilizing well. Plans DC today. Patient continues to be lethargic when not stimulated.  Follow Up Recommendations  Home health PT;Supervision/Assistance - 24 hour     Equipment Recommendations  None recommended by PT    Recommendations for Other Services       Precautions / Restrictions Precautions Precautions: Fall;Knee Precaution Comments: monitor sats and HR, has required O2 to keep sats > 90 % Restrictions Weight Bearing Restrictions: Yes LLE Weight Bearing: Weight bearing as tolerated    Mobility  Bed Mobility                  Transfers Overall transfer level: Needs assistance Equipment used: Rolling walker (2 wheeled) Transfers: Sit to/from Stand Sit to Stand: Min guard         General transfer comment: cues for  hand position and posture, cues for deep breaths, spouse assisting appropriately  Ambulation/Gait Ambulation/Gait assistance: Min guard Ambulation Distance (Feet): 162feet   Assistive device: Rolling walker (2 wheeled) Gait Pattern/deviations: Step-through pattern Gait velocity: slow   General Gait Details: slow  pace, stopped for rest break x 3 , standing, encouraged pursed lip breathing.   Stairs         General stair comments: spouse declined need to practice, has WC and ramp.  Wheelchair Mobility    Modified Rankin (Stroke Patients Only)       Balance                                    Cognition Arousal/Alertness: Lethargic (arouses when stimulated.)                          Exercises      General Comments        Pertinent Vitals/Pain Pain  Score: 3  Pain Location: L knee Pain Descriptors / Indicators: Discomfort Pain Intervention(s): Premedicated before session;Monitored during session    Home Living                      Prior Function            PT Goals (current goals can now be found in the care plan section) Progress towards PT goals: Progressing toward goals    Frequency  7X/week    PT Plan Current plan remains appropriate    Co-evaluation             End of Session   Activity Tolerance: Patient tolerated treatment well;Patient limited by fatigue Patient left: in chair;with call bell/phone within reach     Time: 1102-1150 PT Time Calculation (min) (ACUTE ONLY): 48 min  Charges:  $Gait Training: 8-22 mins $Self Care/Home Management: 23-37                    G Codes:      Claretha Cooper 08/10/2014, 12:01 PM Tresa Endo PT [phone]

## 2014-08-11 DIAGNOSIS — M25562 Pain in left knee: Secondary | ICD-10-CM | POA: Diagnosis not present

## 2014-08-11 DIAGNOSIS — Z9181 History of falling: Secondary | ICD-10-CM | POA: Diagnosis not present

## 2014-08-11 DIAGNOSIS — I10 Essential (primary) hypertension: Secondary | ICD-10-CM | POA: Diagnosis not present

## 2014-08-11 DIAGNOSIS — Z471 Aftercare following joint replacement surgery: Secondary | ICD-10-CM | POA: Diagnosis not present

## 2014-08-11 DIAGNOSIS — E663 Overweight: Secondary | ICD-10-CM | POA: Diagnosis not present

## 2014-08-11 DIAGNOSIS — M1990 Unspecified osteoarthritis, unspecified site: Secondary | ICD-10-CM | POA: Diagnosis not present

## 2014-08-11 DIAGNOSIS — G2581 Restless legs syndrome: Secondary | ICD-10-CM | POA: Diagnosis not present

## 2014-08-11 DIAGNOSIS — Z96652 Presence of left artificial knee joint: Secondary | ICD-10-CM | POA: Diagnosis not present

## 2014-08-11 DIAGNOSIS — E119 Type 2 diabetes mellitus without complications: Secondary | ICD-10-CM | POA: Diagnosis not present

## 2014-08-13 DIAGNOSIS — Z471 Aftercare following joint replacement surgery: Secondary | ICD-10-CM | POA: Diagnosis not present

## 2014-08-13 DIAGNOSIS — G2581 Restless legs syndrome: Secondary | ICD-10-CM | POA: Diagnosis not present

## 2014-08-13 DIAGNOSIS — E663 Overweight: Secondary | ICD-10-CM | POA: Diagnosis not present

## 2014-08-13 DIAGNOSIS — Z9181 History of falling: Secondary | ICD-10-CM | POA: Diagnosis not present

## 2014-08-13 DIAGNOSIS — M25562 Pain in left knee: Secondary | ICD-10-CM | POA: Diagnosis not present

## 2014-08-13 DIAGNOSIS — E119 Type 2 diabetes mellitus without complications: Secondary | ICD-10-CM | POA: Diagnosis not present

## 2014-08-13 DIAGNOSIS — I10 Essential (primary) hypertension: Secondary | ICD-10-CM | POA: Diagnosis not present

## 2014-08-13 DIAGNOSIS — M1990 Unspecified osteoarthritis, unspecified site: Secondary | ICD-10-CM | POA: Diagnosis not present

## 2014-08-13 DIAGNOSIS — Z96652 Presence of left artificial knee joint: Secondary | ICD-10-CM | POA: Diagnosis not present

## 2014-08-13 NOTE — Progress Notes (Signed)
Discharge summary sent to payer through [facility]  and spiral CT results from 3/24

## 2014-08-14 DIAGNOSIS — M25562 Pain in left knee: Secondary | ICD-10-CM | POA: Diagnosis not present

## 2014-08-14 DIAGNOSIS — Z96652 Presence of left artificial knee joint: Secondary | ICD-10-CM | POA: Diagnosis not present

## 2014-08-14 DIAGNOSIS — E119 Type 2 diabetes mellitus without complications: Secondary | ICD-10-CM | POA: Diagnosis not present

## 2014-08-14 DIAGNOSIS — G2581 Restless legs syndrome: Secondary | ICD-10-CM | POA: Diagnosis not present

## 2014-08-14 DIAGNOSIS — I10 Essential (primary) hypertension: Secondary | ICD-10-CM | POA: Diagnosis not present

## 2014-08-14 DIAGNOSIS — Z471 Aftercare following joint replacement surgery: Secondary | ICD-10-CM | POA: Diagnosis not present

## 2014-08-14 DIAGNOSIS — E663 Overweight: Secondary | ICD-10-CM | POA: Diagnosis not present

## 2014-08-14 DIAGNOSIS — M1990 Unspecified osteoarthritis, unspecified site: Secondary | ICD-10-CM | POA: Diagnosis not present

## 2014-08-14 DIAGNOSIS — Z9181 History of falling: Secondary | ICD-10-CM | POA: Diagnosis not present

## 2014-08-15 DIAGNOSIS — E119 Type 2 diabetes mellitus without complications: Secondary | ICD-10-CM | POA: Diagnosis not present

## 2014-08-15 DIAGNOSIS — Z471 Aftercare following joint replacement surgery: Secondary | ICD-10-CM | POA: Diagnosis not present

## 2014-08-15 DIAGNOSIS — M25562 Pain in left knee: Secondary | ICD-10-CM | POA: Diagnosis not present

## 2014-08-15 DIAGNOSIS — Z9181 History of falling: Secondary | ICD-10-CM | POA: Diagnosis not present

## 2014-08-15 DIAGNOSIS — M1990 Unspecified osteoarthritis, unspecified site: Secondary | ICD-10-CM | POA: Diagnosis not present

## 2014-08-15 DIAGNOSIS — E663 Overweight: Secondary | ICD-10-CM | POA: Diagnosis not present

## 2014-08-15 DIAGNOSIS — I10 Essential (primary) hypertension: Secondary | ICD-10-CM | POA: Diagnosis not present

## 2014-08-15 DIAGNOSIS — Z96652 Presence of left artificial knee joint: Secondary | ICD-10-CM | POA: Diagnosis not present

## 2014-08-15 DIAGNOSIS — G2581 Restless legs syndrome: Secondary | ICD-10-CM | POA: Diagnosis not present

## 2014-08-16 DIAGNOSIS — I10 Essential (primary) hypertension: Secondary | ICD-10-CM | POA: Diagnosis not present

## 2014-08-16 DIAGNOSIS — G2581 Restless legs syndrome: Secondary | ICD-10-CM | POA: Diagnosis not present

## 2014-08-16 DIAGNOSIS — E663 Overweight: Secondary | ICD-10-CM | POA: Diagnosis not present

## 2014-08-16 DIAGNOSIS — Z96652 Presence of left artificial knee joint: Secondary | ICD-10-CM | POA: Diagnosis not present

## 2014-08-16 DIAGNOSIS — Z471 Aftercare following joint replacement surgery: Secondary | ICD-10-CM | POA: Diagnosis not present

## 2014-08-16 DIAGNOSIS — Z9181 History of falling: Secondary | ICD-10-CM | POA: Diagnosis not present

## 2014-08-16 DIAGNOSIS — E119 Type 2 diabetes mellitus without complications: Secondary | ICD-10-CM | POA: Diagnosis not present

## 2014-08-16 DIAGNOSIS — M25562 Pain in left knee: Secondary | ICD-10-CM | POA: Diagnosis not present

## 2014-08-16 DIAGNOSIS — M1990 Unspecified osteoarthritis, unspecified site: Secondary | ICD-10-CM | POA: Diagnosis not present

## 2014-08-17 DIAGNOSIS — Z9181 History of falling: Secondary | ICD-10-CM | POA: Diagnosis not present

## 2014-08-17 DIAGNOSIS — M1990 Unspecified osteoarthritis, unspecified site: Secondary | ICD-10-CM | POA: Diagnosis not present

## 2014-08-17 DIAGNOSIS — E119 Type 2 diabetes mellitus without complications: Secondary | ICD-10-CM | POA: Diagnosis not present

## 2014-08-17 DIAGNOSIS — E663 Overweight: Secondary | ICD-10-CM | POA: Diagnosis not present

## 2014-08-17 DIAGNOSIS — M25562 Pain in left knee: Secondary | ICD-10-CM | POA: Diagnosis not present

## 2014-08-17 DIAGNOSIS — Z471 Aftercare following joint replacement surgery: Secondary | ICD-10-CM | POA: Diagnosis not present

## 2014-08-17 DIAGNOSIS — Z96652 Presence of left artificial knee joint: Secondary | ICD-10-CM | POA: Diagnosis not present

## 2014-08-17 DIAGNOSIS — G2581 Restless legs syndrome: Secondary | ICD-10-CM | POA: Diagnosis not present

## 2014-08-17 DIAGNOSIS — I10 Essential (primary) hypertension: Secondary | ICD-10-CM | POA: Diagnosis not present

## 2014-08-18 DIAGNOSIS — M1990 Unspecified osteoarthritis, unspecified site: Secondary | ICD-10-CM | POA: Diagnosis not present

## 2014-08-18 DIAGNOSIS — E663 Overweight: Secondary | ICD-10-CM | POA: Diagnosis not present

## 2014-08-18 DIAGNOSIS — I10 Essential (primary) hypertension: Secondary | ICD-10-CM | POA: Diagnosis not present

## 2014-08-18 DIAGNOSIS — M25562 Pain in left knee: Secondary | ICD-10-CM | POA: Diagnosis not present

## 2014-08-18 DIAGNOSIS — G2581 Restless legs syndrome: Secondary | ICD-10-CM | POA: Diagnosis not present

## 2014-08-18 DIAGNOSIS — E119 Type 2 diabetes mellitus without complications: Secondary | ICD-10-CM | POA: Diagnosis not present

## 2014-08-18 DIAGNOSIS — Z471 Aftercare following joint replacement surgery: Secondary | ICD-10-CM | POA: Diagnosis not present

## 2014-08-18 DIAGNOSIS — Z9181 History of falling: Secondary | ICD-10-CM | POA: Diagnosis not present

## 2014-08-18 DIAGNOSIS — Z96652 Presence of left artificial knee joint: Secondary | ICD-10-CM | POA: Diagnosis not present

## 2014-08-19 ENCOUNTER — Other Ambulatory Visit: Payer: Self-pay | Admitting: Family Medicine

## 2014-08-21 DIAGNOSIS — I10 Essential (primary) hypertension: Secondary | ICD-10-CM | POA: Diagnosis not present

## 2014-08-21 DIAGNOSIS — Z471 Aftercare following joint replacement surgery: Secondary | ICD-10-CM | POA: Diagnosis not present

## 2014-08-21 DIAGNOSIS — E663 Overweight: Secondary | ICD-10-CM | POA: Diagnosis not present

## 2014-08-21 DIAGNOSIS — Z96652 Presence of left artificial knee joint: Secondary | ICD-10-CM | POA: Diagnosis not present

## 2014-08-21 DIAGNOSIS — Z9181 History of falling: Secondary | ICD-10-CM | POA: Diagnosis not present

## 2014-08-21 DIAGNOSIS — M25562 Pain in left knee: Secondary | ICD-10-CM | POA: Diagnosis not present

## 2014-08-21 DIAGNOSIS — E119 Type 2 diabetes mellitus without complications: Secondary | ICD-10-CM | POA: Diagnosis not present

## 2014-08-21 DIAGNOSIS — M1990 Unspecified osteoarthritis, unspecified site: Secondary | ICD-10-CM | POA: Diagnosis not present

## 2014-08-21 DIAGNOSIS — G2581 Restless legs syndrome: Secondary | ICD-10-CM | POA: Diagnosis not present

## 2014-08-22 DIAGNOSIS — E119 Type 2 diabetes mellitus without complications: Secondary | ICD-10-CM | POA: Diagnosis not present

## 2014-08-22 DIAGNOSIS — I10 Essential (primary) hypertension: Secondary | ICD-10-CM | POA: Diagnosis not present

## 2014-08-22 DIAGNOSIS — Z471 Aftercare following joint replacement surgery: Secondary | ICD-10-CM | POA: Diagnosis not present

## 2014-08-22 DIAGNOSIS — Z9181 History of falling: Secondary | ICD-10-CM | POA: Diagnosis not present

## 2014-08-22 DIAGNOSIS — M1990 Unspecified osteoarthritis, unspecified site: Secondary | ICD-10-CM | POA: Diagnosis not present

## 2014-08-22 DIAGNOSIS — M25562 Pain in left knee: Secondary | ICD-10-CM | POA: Diagnosis not present

## 2014-08-22 DIAGNOSIS — E663 Overweight: Secondary | ICD-10-CM | POA: Diagnosis not present

## 2014-08-22 DIAGNOSIS — G2581 Restless legs syndrome: Secondary | ICD-10-CM | POA: Diagnosis not present

## 2014-08-22 DIAGNOSIS — Z96652 Presence of left artificial knee joint: Secondary | ICD-10-CM | POA: Diagnosis not present

## 2014-08-24 DIAGNOSIS — Z9181 History of falling: Secondary | ICD-10-CM | POA: Diagnosis not present

## 2014-08-24 DIAGNOSIS — G2581 Restless legs syndrome: Secondary | ICD-10-CM | POA: Diagnosis not present

## 2014-08-24 DIAGNOSIS — E663 Overweight: Secondary | ICD-10-CM | POA: Diagnosis not present

## 2014-08-24 DIAGNOSIS — M1990 Unspecified osteoarthritis, unspecified site: Secondary | ICD-10-CM | POA: Diagnosis not present

## 2014-08-24 DIAGNOSIS — Z471 Aftercare following joint replacement surgery: Secondary | ICD-10-CM | POA: Diagnosis not present

## 2014-08-24 DIAGNOSIS — M25562 Pain in left knee: Secondary | ICD-10-CM | POA: Diagnosis not present

## 2014-08-24 DIAGNOSIS — Z96652 Presence of left artificial knee joint: Secondary | ICD-10-CM | POA: Diagnosis not present

## 2014-08-24 DIAGNOSIS — I10 Essential (primary) hypertension: Secondary | ICD-10-CM | POA: Diagnosis not present

## 2014-08-24 DIAGNOSIS — E119 Type 2 diabetes mellitus without complications: Secondary | ICD-10-CM | POA: Diagnosis not present

## 2014-08-27 DIAGNOSIS — M1990 Unspecified osteoarthritis, unspecified site: Secondary | ICD-10-CM | POA: Diagnosis not present

## 2014-08-27 DIAGNOSIS — I10 Essential (primary) hypertension: Secondary | ICD-10-CM | POA: Diagnosis not present

## 2014-08-27 DIAGNOSIS — Z96652 Presence of left artificial knee joint: Secondary | ICD-10-CM | POA: Diagnosis not present

## 2014-08-27 DIAGNOSIS — E119 Type 2 diabetes mellitus without complications: Secondary | ICD-10-CM | POA: Diagnosis not present

## 2014-08-27 DIAGNOSIS — Z9181 History of falling: Secondary | ICD-10-CM | POA: Diagnosis not present

## 2014-08-27 DIAGNOSIS — G2581 Restless legs syndrome: Secondary | ICD-10-CM | POA: Diagnosis not present

## 2014-08-27 DIAGNOSIS — E663 Overweight: Secondary | ICD-10-CM | POA: Diagnosis not present

## 2014-08-27 DIAGNOSIS — M25562 Pain in left knee: Secondary | ICD-10-CM | POA: Diagnosis not present

## 2014-08-27 DIAGNOSIS — Z471 Aftercare following joint replacement surgery: Secondary | ICD-10-CM | POA: Diagnosis not present

## 2014-08-28 DIAGNOSIS — E119 Type 2 diabetes mellitus without complications: Secondary | ICD-10-CM | POA: Diagnosis not present

## 2014-08-28 DIAGNOSIS — Z9181 History of falling: Secondary | ICD-10-CM | POA: Diagnosis not present

## 2014-08-28 DIAGNOSIS — E663 Overweight: Secondary | ICD-10-CM | POA: Diagnosis not present

## 2014-08-28 DIAGNOSIS — G2581 Restless legs syndrome: Secondary | ICD-10-CM | POA: Diagnosis not present

## 2014-08-28 DIAGNOSIS — Z471 Aftercare following joint replacement surgery: Secondary | ICD-10-CM | POA: Diagnosis not present

## 2014-08-28 DIAGNOSIS — I10 Essential (primary) hypertension: Secondary | ICD-10-CM | POA: Diagnosis not present

## 2014-08-28 DIAGNOSIS — M1990 Unspecified osteoarthritis, unspecified site: Secondary | ICD-10-CM | POA: Diagnosis not present

## 2014-08-28 DIAGNOSIS — Z96652 Presence of left artificial knee joint: Secondary | ICD-10-CM | POA: Diagnosis not present

## 2014-08-28 DIAGNOSIS — M25562 Pain in left knee: Secondary | ICD-10-CM | POA: Diagnosis not present

## 2014-08-30 DIAGNOSIS — G2581 Restless legs syndrome: Secondary | ICD-10-CM | POA: Diagnosis not present

## 2014-08-30 DIAGNOSIS — Z96652 Presence of left artificial knee joint: Secondary | ICD-10-CM | POA: Diagnosis not present

## 2014-08-30 DIAGNOSIS — E119 Type 2 diabetes mellitus without complications: Secondary | ICD-10-CM | POA: Diagnosis not present

## 2014-08-30 DIAGNOSIS — Z9181 History of falling: Secondary | ICD-10-CM | POA: Diagnosis not present

## 2014-08-30 DIAGNOSIS — M1990 Unspecified osteoarthritis, unspecified site: Secondary | ICD-10-CM | POA: Diagnosis not present

## 2014-08-30 DIAGNOSIS — I10 Essential (primary) hypertension: Secondary | ICD-10-CM | POA: Diagnosis not present

## 2014-08-30 DIAGNOSIS — E663 Overweight: Secondary | ICD-10-CM | POA: Diagnosis not present

## 2014-08-30 DIAGNOSIS — Z471 Aftercare following joint replacement surgery: Secondary | ICD-10-CM | POA: Diagnosis not present

## 2014-08-30 DIAGNOSIS — M25562 Pain in left knee: Secondary | ICD-10-CM | POA: Diagnosis not present

## 2014-09-03 DIAGNOSIS — E663 Overweight: Secondary | ICD-10-CM | POA: Diagnosis not present

## 2014-09-03 DIAGNOSIS — Z9181 History of falling: Secondary | ICD-10-CM | POA: Diagnosis not present

## 2014-09-03 DIAGNOSIS — I10 Essential (primary) hypertension: Secondary | ICD-10-CM | POA: Diagnosis not present

## 2014-09-03 DIAGNOSIS — M25562 Pain in left knee: Secondary | ICD-10-CM | POA: Diagnosis not present

## 2014-09-03 DIAGNOSIS — E119 Type 2 diabetes mellitus without complications: Secondary | ICD-10-CM | POA: Diagnosis not present

## 2014-09-03 DIAGNOSIS — Z96652 Presence of left artificial knee joint: Secondary | ICD-10-CM | POA: Diagnosis not present

## 2014-09-03 DIAGNOSIS — G2581 Restless legs syndrome: Secondary | ICD-10-CM | POA: Diagnosis not present

## 2014-09-03 DIAGNOSIS — Z471 Aftercare following joint replacement surgery: Secondary | ICD-10-CM | POA: Diagnosis not present

## 2014-09-03 DIAGNOSIS — M1990 Unspecified osteoarthritis, unspecified site: Secondary | ICD-10-CM | POA: Diagnosis not present

## 2014-09-05 DIAGNOSIS — Z96652 Presence of left artificial knee joint: Secondary | ICD-10-CM | POA: Diagnosis not present

## 2014-09-05 DIAGNOSIS — M25562 Pain in left knee: Secondary | ICD-10-CM | POA: Diagnosis not present

## 2014-09-05 DIAGNOSIS — M1990 Unspecified osteoarthritis, unspecified site: Secondary | ICD-10-CM | POA: Diagnosis not present

## 2014-09-05 DIAGNOSIS — Z471 Aftercare following joint replacement surgery: Secondary | ICD-10-CM | POA: Diagnosis not present

## 2014-09-05 DIAGNOSIS — Z9181 History of falling: Secondary | ICD-10-CM | POA: Diagnosis not present

## 2014-09-05 DIAGNOSIS — G2581 Restless legs syndrome: Secondary | ICD-10-CM | POA: Diagnosis not present

## 2014-09-05 DIAGNOSIS — E663 Overweight: Secondary | ICD-10-CM | POA: Diagnosis not present

## 2014-09-05 DIAGNOSIS — I10 Essential (primary) hypertension: Secondary | ICD-10-CM | POA: Diagnosis not present

## 2014-09-05 DIAGNOSIS — E119 Type 2 diabetes mellitus without complications: Secondary | ICD-10-CM | POA: Diagnosis not present

## 2014-09-07 DIAGNOSIS — E119 Type 2 diabetes mellitus without complications: Secondary | ICD-10-CM | POA: Diagnosis not present

## 2014-09-07 DIAGNOSIS — Z96652 Presence of left artificial knee joint: Secondary | ICD-10-CM | POA: Diagnosis not present

## 2014-09-07 DIAGNOSIS — E663 Overweight: Secondary | ICD-10-CM | POA: Diagnosis not present

## 2014-09-07 DIAGNOSIS — I10 Essential (primary) hypertension: Secondary | ICD-10-CM | POA: Diagnosis not present

## 2014-09-07 DIAGNOSIS — M1990 Unspecified osteoarthritis, unspecified site: Secondary | ICD-10-CM | POA: Diagnosis not present

## 2014-09-07 DIAGNOSIS — Z471 Aftercare following joint replacement surgery: Secondary | ICD-10-CM | POA: Diagnosis not present

## 2014-09-07 DIAGNOSIS — Z9181 History of falling: Secondary | ICD-10-CM | POA: Diagnosis not present

## 2014-09-07 DIAGNOSIS — M25562 Pain in left knee: Secondary | ICD-10-CM | POA: Diagnosis not present

## 2014-09-07 DIAGNOSIS — G2581 Restless legs syndrome: Secondary | ICD-10-CM | POA: Diagnosis not present

## 2014-09-11 DIAGNOSIS — Z96652 Presence of left artificial knee joint: Secondary | ICD-10-CM | POA: Diagnosis not present

## 2014-09-11 DIAGNOSIS — Z471 Aftercare following joint replacement surgery: Secondary | ICD-10-CM | POA: Diagnosis not present

## 2014-09-19 NOTE — Progress Notes (Signed)
Department of Radiation Oncology  Phone:  [phone] Fax:        [phone]   Name: [name] MRN: [id number]  DOB: 1940/10/10  Date: 09/20/2014  Follow Up Visit Note  Diagnosis: T1N0 right breast cancer  Summary and Interval since last radiation: almost 5 years (mammosite finished 05/05/10)  Interval History: [name] presents today for routine followup.  She is doing well from her breast standpoint. She is on Arimidex. She had mammograms in November which were negative. She had er knee replaced in March and tolerated that well.  Her husband unfortunately still has seizures occasionally. The scar tissue in her breast only bothers her when she is wearing her seatbelt.  Allergies:  Allergies  Allergen Reactions  . Other     BLOOD PRODUCT REFUSAL  . Sulfonamide Derivatives Swelling    Medications:  Current Outpatient Prescriptions  Medication Sig Dispense Refill  . amLODipine (NORVASC) 10 MG tablet Take 1 tablet (10 mg total) by mouth daily. 90 tablet 1  . anastrozole (ARIMIDEX) 1 MG tablet TAKE ONE TABLET BY MOUTH ONCE DAILY 30 tablet 4  . diazepam (VALIUM) 5 MG tablet Take 5 mg by mouth every 6 (six) hours as needed for anxiety.    Marland Kitchen glucose blood (ACCU-CHEK AVIVA) test strip Test once per day 100 each 1  . HYDROcodone-acetaminophen (NORCO) 7.5-325 MG per tablet Take 1-2 tablets by mouth every 4 (four) hours as needed for moderate pain. 90 tablet 0  . JANUVIA 100 MG tablet TAKE ONE TABLET BY MOUTH ONCE DAILY 30 tablet 0  . Lancets (ACCU-CHEK SOFT TOUCH) lancets Use as instructed to check blood sugar 100 each 1  . lisinopril-hydrochlorothiazide (PRINZIDE,ZESTORETIC) 20-25 MG per tablet Take 1 tablet by mouth at bedtime.     . metFORMIN (GLUCOPHAGE) 1000 MG tablet 1000mg  (1 tablet) twice daily 180 tablet 3  . methocarbamol (ROBAXIN) 500 MG tablet Take 1 tablet (500 mg total) by mouth every 6 (six) hours as needed for muscle spasms. 80 tablet 0  . oxybutynin (DITROPAN) 5 MG  tablet TAKE ONE TABLET BY MOUTH TWICE DAILY 60 tablet 0  . pramipexole (MIRAPEX) 1 MG tablet TAKE ONE TABLET BY MOUTH TWICE DAILY (Patient taking differently: Take 1 mg by mouth 2 (two) times daily as needed (leg cramps). ) 60 tablet 2  . sitaGLIPtin (JANUVIA) 100 MG tablet Take 1 tablet (100 mg total) by mouth daily. 30 tablet 1  . traMADol (ULTRAM) 50 MG tablet Take 1-2 tablets (50-100 mg total) by mouth every 6 (six) hours as needed (mild pain). 80 tablet 1  . diphenhydramine-acetaminophen (TYLENOL PM) 25-500 MG TABS Take 1 tablet by mouth at bedtime as needed (for pain /sleep).    Marland Kitchen oxyCODONE-acetaminophen (PERCOCET/ROXICET) 5-325 MG per tablet Take by mouth every 6 (six) hours as needed.  0  . rivaroxaban (XARELTO) 10 MG TABS tablet Take 1 tablet (10 mg total) by mouth daily with breakfast. Take Xarelto for two and a half more weeks, then discontinue Xarelto. Once the patient has completed the blood thinner regimen, then take a Baby 81 mg Aspirin daily for three more weeks. (Patient not taking: Reported on 09/20/2014) 19 tablet 0   No current facility-administered medications for this encounter.    Physical Exam:  Filed Vitals:   09/20/14 1330  BP: 138/78  Pulse: 86  Temp: 97.7 F (36.5 C)  Hard mass in the upper to upper outer quadrant of the right breast with a well healed surgical scar which is stable  and actually more mobile than it has been in the past. No palpable abnormalities of the left breast. No palpable axillary or supraclavicular adenopathy bilaterally.   IMPRESSION: [name] is a 74 y.o. female status post MammoSite for an early stage right breast cancer with no evidence of disease   PLAN:  She is a candidate for survivorship and will be scheduled with the survivorship nurse practitioner in [place]. She will be due for her mammagram at that time and will continue her Arimidex.  This document serves as a record of services personally performed by Thea Silversmith, MD. It was  created on her behalf by Arlyce Harman, a trained medical scribe. The creation of this record is based on the scribe's personal observations and the provider's statements to them. This document has been checked and approved by the attending provider.      Thea Silversmith, MD

## 2014-09-20 ENCOUNTER — Ambulatory Visit
Admission: RE | Admit: 2014-09-20 | Discharge: 2014-09-20 | Disposition: A | Discharge status: Home / Self Care | Payer: Commercial Managed Care - HMO | Source: Ambulatory Visit | Attending: Radiation Oncology | Admitting: Radiation Oncology

## 2014-09-20 VITALS — BP 138/78 | HR 86 | Temp 97.7°F | Wt 186.2 lb

## 2014-09-20 DIAGNOSIS — C50911 Malignant neoplasm of unspecified site of right female breast: Secondary | ICD-10-CM | POA: Diagnosis not present

## 2014-09-21 ENCOUNTER — Telehealth: Payer: Self-pay | Admitting: *Deleted

## 2014-09-21 NOTE — Telephone Encounter (Signed)
Called patient to inform of mammogram on Nov. 21- arrival time - 10 am @ [facility], spoke with patient and she is aware of this mammogram.

## 2014-09-24 ENCOUNTER — Other Ambulatory Visit: Payer: Self-pay | Admitting: Family Medicine

## 2014-09-27 ENCOUNTER — Ambulatory Visit (INDEPENDENT_AMBULATORY_CARE_PROVIDER_SITE_OTHER): Payer: Commercial Managed Care - HMO | Admitting: Family Medicine

## 2014-09-27 ENCOUNTER — Encounter: Payer: Self-pay | Admitting: Family Medicine

## 2014-09-27 VITALS — BP 140/82 | HR 94 | Temp 98.3°F | Ht 61.5 in | Wt 183.7 lb

## 2014-09-27 DIAGNOSIS — I10 Essential (primary) hypertension: Secondary | ICD-10-CM

## 2014-09-27 DIAGNOSIS — E1140 Type 2 diabetes mellitus with diabetic neuropathy, unspecified: Secondary | ICD-10-CM

## 2014-09-27 DIAGNOSIS — M549 Dorsalgia, unspecified: Secondary | ICD-10-CM | POA: Diagnosis not present

## 2014-09-27 DIAGNOSIS — M1990 Unspecified osteoarthritis, unspecified site: Secondary | ICD-10-CM

## 2014-09-27 DIAGNOSIS — Z6834 Body mass index (BMI) 34.0-34.9, adult: Secondary | ICD-10-CM | POA: Diagnosis not present

## 2014-09-27 DIAGNOSIS — G8929 Other chronic pain: Secondary | ICD-10-CM

## 2014-09-27 NOTE — Patient Instructions (Addendum)
BEFORE YOU LEAVE: -schedule FASTING lab appointment in next 2 weeks -follow up in 4 months  Please schedule appointment with orthopedic office about your back pain - please let me know if you need a referral for this

## 2014-09-27 NOTE — Progress Notes (Signed)
Pre visit review using our clinic review tool, if applicable. No additional management support is needed unless otherwise documented below in the visit note. 

## 2014-09-27 NOTE — Progress Notes (Signed)
HPI:  HM: -foot exam  DM: -complications: ? some mild peripheral neuropathy tingling in feet at times -changed to [place] 02/2014 -continues to take metformin -last eye exam: has been about 1 year -diet poor, no exercise -denies: polyuria, polydipsia, vision changes, hypoglycemia  HTN: -meds: amlodipine 10mg , she decided to restart lisinopril-hctz on her own due to swelling in legs and this resolved (the diuretic had been stopped due to hypokalemia) -denies: CP, SOB, DOE, palpitations  Urinary incontinence: -meds: oxybutynin, stable  OA of the knees/chronic back pain: -followed by Dr. Peri Maris office -reports chronic intermittent back pain, severe at times - hospitalized overnight for this before her surgery - occ tingling in bilat fingers and feet, she has chronic bilat shoulder pain and neck pain also -on pain meds for knee right now that don't help -s/p LTK 07/2014  RLS: -meds: mirapex  Hx of Breast Ca: -managed by Dr. Humphrey Rolls in [place] -meds: arimidex   ROS: See pertinent positives and negatives per HPI.  Past Medical History  Diagnosis Date  . Hypertension   . Arthritis   . Hearing loss   . History of radiation therapy 04/29/10-05/05/10    right breast 34Gy/78fx  . Actinic keratosis 07/18/2008    Qualifier: Diagnosis of  By: Arnoldo Morale MD, Balinda Quails   . Closed fracture of one or more phalanges of foot 03/29/2008    Qualifier: Diagnosis of  By: Arnoldo Morale MD, Balinda Quails   . No blood products (Jehovah Witness)  09/11/2013  . RESTLESS LEGS SYNDROME 05/05/2007    Qualifier: Diagnosis of  By: Gwenette Greet MD, Armando Reichert   . Radiation 05/05/2010    Right Breast mammosite  . Sleep apnea   . History of environmental allergies   . Diabetes mellitus     type 2  . Anxiety     occasional  . History of kidney stones 20 years ago  . Falls frequently     "can not pick left leg up"  . Cancer dx'd 2012    right breast --xrt    Past Surgical History  Procedure Laterality Date  . Flex  signoidoscopy    . Incision and drainage perirectal abscess    . Tubal ligation  1979  . Breast surgery  2012    rt breast lumpectomy  . Appendectomy  age 50  . Joint replacement Right 2006    knee  . 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 Cancer Sister     breast  . Heart disease Paternal Grandfather   . Diabetes Other   . Obesity Other   . Heart disease Brother     Valve    History   Social History  . Marital Status: Married    Spouse Name: N/A  . Number of Children: 3  . Years of Education: N/A   Social History Main Topics  . Smoking status: Never Smoker   . Smokeless tobacco: Never Used  . Alcohol Use: No  . Drug Use: No  . Sexual Activity: Yes   Other Topics Concern  . None   Social History Narrative         Home Situation: lives with husband and granddaughter      Spiritual Beliefs: Jehovah Witness - no blood products  Current outpatient prescriptions:  .  amLODipine (NORVASC) 10 MG tablet, Take 1 tablet (10 mg total) by mouth daily., Disp: 90 tablet, Rfl: 1 .  anastrozole (ARIMIDEX) 1 MG tablet, TAKE ONE TABLET BY MOUTH ONCE DAILY, Disp: 30 tablet, Rfl: 4 .  diazepam (VALIUM) 5 MG tablet, Take 5 mg by mouth every 6 (six) hours as needed for anxiety., Disp: , Rfl:  .  diphenhydramine-acetaminophen (TYLENOL PM) 25-500 MG TABS, Take 1 tablet by mouth at bedtime as needed (for pain /sleep)., Disp: , Rfl:  .  glucose blood (ACCU-CHEK AVIVA) test strip, Test once per day, Disp: 100 each, Rfl: 1 .  HYDROcodone-acetaminophen (NORCO) 7.5-325 MG per tablet, Take 1-2 tablets by mouth every 4 (four) hours as needed for moderate pain., Disp: 90 tablet, Rfl: 0 .  JANUVIA 100 MG tablet, TAKE ONE TABLET BY MOUTH ONCE DAILY, Disp: 30 tablet, Rfl: 0 .  Lancets  (ACCU-CHEK SOFT TOUCH) lancets, Use as instructed to check blood sugar, Disp: 100 each, Rfl: 1 .  lisinopril-hydrochlorothiazide (PRINZIDE,ZESTORETIC) 20-25 MG per tablet, Take 1 tablet by mouth at bedtime. , Disp: , Rfl:  .  metFORMIN (GLUCOPHAGE) 1000 MG tablet, 1000mg  (1 tablet) twice daily, Disp: 180 tablet, Rfl: 3 .  methocarbamol (ROBAXIN) 500 MG tablet, Take 1 tablet (500 mg total) by mouth every 6 (six) hours as needed for muscle spasms., Disp: 80 tablet, Rfl: 0 .  oxybutynin (DITROPAN) 5 MG tablet, TAKE ONE TABLET BY MOUTH TWICE DAILY, Disp: 60 tablet, Rfl: 0 .  oxyCODONE-acetaminophen (PERCOCET/ROXICET) 5-325 MG per tablet, Take by mouth every 6 (six) hours as needed., Disp: , Rfl: 0 .  pramipexole (MIRAPEX) 1 MG tablet, TAKE ONE TABLET BY MOUTH TWICE DAILY (Patient taking differently: Take 1 mg by mouth 2 (two) times daily as needed (leg cramps). ), Disp: 60 tablet, Rfl: 2 .  rivaroxaban (XARELTO) 10 MG TABS tablet, Take 1 tablet (10 mg total) by mouth daily with breakfast. Take Xarelto for two and a half more weeks, then discontinue Xarelto. Once the patient has completed the blood thinner regimen, then take a Baby 81 mg Aspirin daily for three more weeks., Disp: 19 tablet, Rfl: 0 .  sitaGLIPtin (JANUVIA) 100 MG tablet, Take 1 tablet (100 mg total) by mouth daily., Disp: 30 tablet, Rfl: 1 .  traMADol (ULTRAM) 50 MG tablet, Take 1-2 tablets (50-100 mg total) by mouth every 6 (six) hours as needed (mild pain)., Disp: 80 tablet, Rfl: 1  EXAM:  Filed Vitals:   09/27/14 0917  BP: 140/82  Pulse: 94  Temp: 98.3 F (36.8 C)    Body mass index is 34.15 kg/(m^2).  GENERAL: vitals reviewed and listed above, alert, oriented, appears well hydrated and in no acute distress  HEENT: atraumatic, conjunttiva clear, no obvious abnormalities on inspection of external nose and ears  NECK: no obvious masses on inspection  LUNGS: clear to auscultation bilaterally, no wheezes, rales or rhonchi, good  air movement  CV: HRRR, no peripheral edema  MS: moves all extremities without noticeable abnormality  PSYCH: pleasant and cooperative, no obvious depression or anxiety  ASSESSMENT AND PLAN:  Discussed the following assessment and plan:  Type 2 diabetes mellitus with diabetic neuropathy - Plan: Hemoglobin E7N, Basic metabolic panel, Lipid Panel -recheck labs, lifestyle recs -advised on proper footwear and foot care, regular self foot checks nightly -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 Nelva Bush or whomever Dr. Maureen Ralphs may advise, offered referral if they need this but they are already seeing ortho at [facility] -advised against chronic narcotic or opiod pain medications for this type of pain and of risks  Essential hypertension - Plan: Basic metabolic panel -stable  BMI 34.0-34.9,adult -lifestyle recs  Osteoarthritis, unspecified osteoarthritis type, unspecified site  -Patient advised to return or notify a doctor immediately if symptoms worsen or persist or new concerns arise.  Patient Instructions  BEFORE YOU LEAVE: -schedule FASTING lab appointment in next 2 weeks -follow up in 4 months  Please schedule appointment with orthopedic office about your back pain - please let me know if you need a referral for this     [name] R.

## 2014-09-28 ENCOUNTER — Other Ambulatory Visit: Payer: Self-pay | Admitting: Family Medicine

## 2014-10-03 ENCOUNTER — Other Ambulatory Visit: Payer: Commercial Managed Care - HMO

## 2014-10-03 LAB — BASIC METABOLIC PANEL
BUN: 10 mg/dL (ref 6–23)
CHLORIDE: 96 meq/L (ref 96–112)
CO2: 32 mEq/L (ref 19–32)
Calcium: 9.3 mg/dL (ref 8.4–10.5)
Creatinine, Ser: 0.51 mg/dL (ref 0.40–1.20)
GFR: 124.77 mL/min (ref >60.00)
Glucose, Bld: 129 mg/dL — ABNORMAL HIGH (ref 70–99)
POTASSIUM: 3.3 meq/L — AB (ref 3.5–5.1)
Sodium: 135 mEq/L (ref 135–145)

## 2014-10-03 LAB — LIPID PANEL
CHOL/HDL RATIO: 3
Cholesterol: 129 mg/dL (ref 0–200)
HDL: 38.3 mg/dL — ABNORMAL LOW (ref >39.00)
LDL CALC: 77 mg/dL (ref 0–99)
NonHDL: 90.7
Triglycerides: 70 mg/dL (ref 0.0–149.0)
VLDL: 14 mg/dL (ref 0.0–40.0)

## 2014-10-03 LAB — HEMOGLOBIN A1C: HEMOGLOBIN A1C: 6.2 % (ref 4.6–6.5)

## 2014-10-19 ENCOUNTER — Other Ambulatory Visit: Payer: Self-pay | Admitting: Hematology and Oncology

## 2014-10-19 ENCOUNTER — Other Ambulatory Visit: Payer: Self-pay | Admitting: Family Medicine

## 2014-10-19 NOTE — Telephone Encounter (Signed)
Last OV 07/30/14.  Next OV 04/05/15.  Chart reviewed.

## 2014-10-25 ENCOUNTER — Other Ambulatory Visit: Payer: Self-pay | Admitting: Family Medicine

## 2014-10-25 MED ORDER — LISINOPRIL-HYDROCHLOROTHIAZIDE 20-25 MG PO TABS: 1.0000 | ORAL_TABLET | Freq: Every day | ORAL | Status: DC

## 2014-10-25 NOTE — Telephone Encounter (Signed)
This has not been filled by Dr. Maudie Mercury.  Last filled by Dr. Arnoldo Morale.

## 2014-10-30 ENCOUNTER — Telehealth: Payer: Self-pay | Admitting: Family Medicine

## 2014-10-30 NOTE — Telephone Encounter (Signed)
Pt stated that she needed referrals for two apptmts. I contacted [employer]. Called pt and left vmail to inform of [facility] having no record of apptmts 6/15 and 6/23. Informed pt to call back.

## 2014-10-31 DIAGNOSIS — M4310 Spondylolisthesis, site unspecified: Secondary | ICD-10-CM | POA: Diagnosis not present

## 2014-10-31 DIAGNOSIS — M5136 Other intervertebral disc degeneration, lumbar region: Secondary | ICD-10-CM | POA: Diagnosis not present

## 2014-11-12 DIAGNOSIS — M5136 Other intervertebral disc degeneration, lumbar region: Secondary | ICD-10-CM | POA: Diagnosis not present

## 2014-11-13 DIAGNOSIS — M25512 Pain in left shoulder: Secondary | ICD-10-CM | POA: Diagnosis not present

## 2014-11-16 DIAGNOSIS — M5136 Other intervertebral disc degeneration, lumbar region: Secondary | ICD-10-CM | POA: Diagnosis not present

## 2014-11-20 DIAGNOSIS — M5136 Other intervertebral disc degeneration, lumbar region: Secondary | ICD-10-CM | POA: Diagnosis not present

## 2014-11-22 ENCOUNTER — Other Ambulatory Visit: Payer: Self-pay | Admitting: Family Medicine

## 2014-11-22 DIAGNOSIS — M5136 Other intervertebral disc degeneration, lumbar region: Secondary | ICD-10-CM | POA: Diagnosis not present

## 2014-11-24 DIAGNOSIS — R200 Anesthesia of skin: Secondary | ICD-10-CM | POA: Diagnosis not present

## 2014-11-27 DIAGNOSIS — M5136 Other intervertebral disc degeneration, lumbar region: Secondary | ICD-10-CM | POA: Diagnosis not present

## 2014-11-29 DIAGNOSIS — M4310 Spondylolisthesis, site unspecified: Secondary | ICD-10-CM | POA: Diagnosis not present

## 2014-11-29 DIAGNOSIS — M5136 Other intervertebral disc degeneration, lumbar region: Secondary | ICD-10-CM | POA: Diagnosis not present

## 2014-11-30 DIAGNOSIS — M5136 Other intervertebral disc degeneration, lumbar region: Secondary | ICD-10-CM | POA: Diagnosis not present

## 2014-12-17 ENCOUNTER — Other Ambulatory Visit: Payer: Self-pay | Admitting: Family Medicine

## 2014-12-17 MED ORDER — PRAMIPEXOLE DIHYDROCHLORIDE 1 MG PO TABS: 1.0000 mg | ORAL_TABLET | Freq: Two times a day (BID) | ORAL | Status: DC

## 2014-12-17 NOTE — Telephone Encounter (Signed)
Pt request refill pramipexole (MIRAPEX) 1 MG tablet Pt is in [place], [place] and needs rx sent to   [facility] in [place]

## 2014-12-17 NOTE — Telephone Encounter (Signed)
Rx done. 

## 2014-12-28 ENCOUNTER — Other Ambulatory Visit: Payer: Self-pay | Admitting: Family Medicine

## 2015-01-08 DIAGNOSIS — M25512 Pain in left shoulder: Secondary | ICD-10-CM | POA: Diagnosis not present

## 2015-01-08 DIAGNOSIS — G5601 Carpal tunnel syndrome, right upper limb: Secondary | ICD-10-CM | POA: Diagnosis not present

## 2015-01-09 ENCOUNTER — Other Ambulatory Visit: Payer: Self-pay | Admitting: Radiation Oncology

## 2015-01-09 DIAGNOSIS — C50911 Malignant neoplasm of unspecified site of right female breast: Secondary | ICD-10-CM

## 2015-01-10 ENCOUNTER — Ambulatory Visit (INDEPENDENT_AMBULATORY_CARE_PROVIDER_SITE_OTHER): Payer: Commercial Managed Care - HMO | Admitting: Family Medicine

## 2015-01-10 ENCOUNTER — Encounter: Payer: Self-pay | Admitting: Family Medicine

## 2015-01-10 VITALS — BP 120/82 | HR 79 | Temp 97.9°F | Ht 61.5 in | Wt 182.4 lb

## 2015-01-10 DIAGNOSIS — M4316 Spondylolisthesis, lumbar region: Secondary | ICD-10-CM | POA: Diagnosis not present

## 2015-01-10 DIAGNOSIS — M5136 Other intervertebral disc degeneration, lumbar region: Secondary | ICD-10-CM | POA: Diagnosis not present

## 2015-01-10 DIAGNOSIS — H9193 Unspecified hearing loss, bilateral: Secondary | ICD-10-CM | POA: Diagnosis not present

## 2015-01-10 NOTE — Progress Notes (Signed)
HPI:  Hearing loss: -sees an audiologist for this -she has hearing aides, she wonders about other options for this -she has difficulty hearing in a noisy room and is not happy with her response to her current hearing aides- current hearing aides 76 years old -wants referral to [facility] for this  ROS: See pertinent positives and negatives per HPI.  Past Medical History  Diagnosis Date  . Hypertension   . Arthritis   . Hearing loss   . History of radiation therapy 04/29/10-05/05/10    right breast 34Gy/69fx  . Actinic keratosis 07/18/2008    Qualifier: Diagnosis of  By: Arnoldo Morale MD, Balinda Quails   . Closed fracture of one or more phalanges of foot 03/29/2008    Qualifier: Diagnosis of  By: Arnoldo Morale MD, Balinda Quails   . No blood products (Jehovah Witness)  09/11/2013  . RESTLESS LEGS SYNDROME 05/05/2007    Qualifier: Diagnosis of  By: Gwenette Greet MD, Armando Reichert   . Radiation 05/05/2010    Right Breast mammosite  . Sleep apnea   . History of environmental allergies   . Diabetes mellitus     type 2  . Anxiety     occasional  . History of kidney stones 20 years ago  . Falls frequently     "can not pick left leg up"  . Cancer dx'd 2012    right breast --xrt    Past Surgical History  Procedure Laterality Date  . Flex signoidoscopy    . Incision and drainage perirectal abscess    . Tubal ligation  1979  . Breast surgery  2012    rt breast lumpectomy  . Appendectomy  age 32  . Joint replacement Right 2006    knee  . 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 Cancer Sister     breast  . Heart disease Paternal Grandfather   . Diabetes Other   . Obesity Other   . Heart disease Brother     Valve    Social History   Social History  . Marital Status: Married    Spouse Name: N/A  . Number of Children: 3   . Years of Education: N/A   Social History Main Topics  . Smoking status: Never Smoker   . Smokeless tobacco: Never Used  . Alcohol Use: No  . Drug Use: No  . Sexual Activity: Yes   Other Topics Concern  . None   Social History Narrative         Home Situation: lives with husband and granddaughter      Spiritual Beliefs: Jehovah Witness - no blood products                    Current outpatient prescriptions:  .  amLODipine (NORVASC) 10 MG tablet, Take 1 tablet (10 mg total) by mouth daily., Disp: 90 tablet, Rfl: 1 .  anastrozole (ARIMIDEX) 1 MG tablet, TAKE ONE TABLET BY MOUTH ONCE DAILY, Disp: 30 tablet, Rfl: 5 .  diazepam (VALIUM) 5 MG tablet, Take 5 mg by mouth every 6 (six) hours as needed for anxiety., Disp: , Rfl:  .  diphenhydramine-acetaminophen (TYLENOL PM) 25-500 MG TABS, Take 1 tablet by mouth at bedtime as needed (for pain /sleep)., Disp: , Rfl:  .  glucose blood (ACCU-CHEK  AVIVA) test strip, Test once per day, Disp: 100 each, Rfl: 1 .  HYDROcodone-acetaminophen (NORCO) 7.5-325 MG per tablet, Take 1-2 tablets by mouth every 4 (four) hours as needed for moderate pain., Disp: 90 tablet, Rfl: 0 .  Lancets (ACCU-CHEK SOFT TOUCH) lancets, Use as instructed to check blood sugar, Disp: 100 each, Rfl: 1 .  lisinopril-hydrochlorothiazide (PRINZIDE,ZESTORETIC) 20-25 MG per tablet, Take 1 tablet by mouth at bedtime., Disp: 90 tablet, Rfl: 3 .  metFORMIN (GLUCOPHAGE) 1000 MG tablet, TAKE ONE TABLET BY MOUTH TWICE DAILY, Disp: 180 tablet, Rfl: 0 .  methocarbamol (ROBAXIN) 500 MG tablet, Take 1 tablet (500 mg total) by mouth every 6 (six) hours as needed for muscle spasms., Disp: 80 tablet, Rfl: 0 .  oxybutynin (DITROPAN) 5 MG tablet, TAKE ONE TABLET BY MOUTH TWICE DAILY, Disp: 60 tablet, Rfl: 2 .  oxyCODONE-acetaminophen (PERCOCET/ROXICET) 5-325 MG per tablet, Take by mouth every 6 (six) hours as needed., Disp: , Rfl: 0 .  pramipexole (MIRAPEX) 1 MG tablet, Take 1 tablet (1 mg  total) by mouth 2 (two) times daily., Disp: 60 tablet, Rfl: 0 .  rivaroxaban (XARELTO) 10 MG TABS tablet, Take 1 tablet (10 mg total) by mouth daily with breakfast. Take Xarelto for two and a half more weeks, then discontinue Xarelto. Once the patient has completed the blood thinner regimen, then take a Baby 81 mg Aspirin daily for three more weeks., Disp: 19 tablet, Rfl: 0 .  traMADol (ULTRAM) 50 MG tablet, Take 1-2 tablets (50-100 mg total) by mouth every 6 (six) hours as needed (mild pain)., Disp: 80 tablet, Rfl: 1  EXAM:  Filed Vitals:   01/10/15 1011  BP: 120/82  Pulse: 79  Temp: 97.9 F (36.6 C)    Body mass index is 33.91 kg/(m^2).  GENERAL: vitals reviewed and listed above, alert, oriented, appears well hydrated and in no acute distress  HEENT: atraumatic, conjunttiva clear, no obvious abnormalities on inspection of external nose and ears  NECK: no obvious masses on inspection  LUNGS: clear to auscultation bilaterally, no wheezes, rales or rhonchi, good air movement  CV: HRRR, no peripheral edema  MS: moves all extremities without noticeable abnormality  PSYCH: pleasant and cooperative, no obvious depression or anxiety  ASSESSMENT AND PLAN:  Discussed the following assessment and plan:  Hearing loss, bilateral - Plan: Ambulatory referral to ENT  -Patient advised to return or notify a doctor immediately if symptoms worsen or persist or new concerns arise.  There are no Patient Instructions on file for this visit.   Colin Benton R.

## 2015-01-10 NOTE — Progress Notes (Signed)
Pre visit review using our clinic review tool, if applicable. No additional management support is needed unless otherwise documented below in the visit note. 

## 2015-01-14 ENCOUNTER — Encounter: Payer: Commercial Managed Care - HMO | Admitting: Nurse Practitioner

## 2015-01-18 ENCOUNTER — Other Ambulatory Visit: Payer: Self-pay | Admitting: Nurse Practitioner

## 2015-01-18 DIAGNOSIS — C50911 Malignant neoplasm of unspecified site of right female breast: Secondary | ICD-10-CM

## 2015-01-22 ENCOUNTER — Other Ambulatory Visit: Payer: Self-pay | Admitting: Nurse Practitioner

## 2015-01-22 ENCOUNTER — Telehealth: Payer: Self-pay | Admitting: *Deleted

## 2015-01-22 ENCOUNTER — Telehealth: Payer: Self-pay | Admitting: Nurse Practitioner

## 2015-01-22 ENCOUNTER — Telehealth: Payer: Self-pay | Admitting: Hematology and Oncology

## 2015-01-22 NOTE — Telephone Encounter (Signed)
CALLED PATIENT TO INFORM OF SURVIVORSHIP APPT. ON 04-16-15 @ 12 PM WITH HEATHER MACKAY, SPOKE WITH PATIENT AND SHE IS AWARE OF THIS APPT.

## 2015-01-22 NOTE — Telephone Encounter (Signed)
Called to clarify upcoming appointment.  No response at patient's number.  Left message for return call.

## 2015-01-22 NOTE — Telephone Encounter (Signed)
Spoke with patient and she is aware of ehr appointments as well as mailed a calendar  anne

## 2015-01-22 NOTE — Telephone Encounter (Signed)
Received return call from [name].  Reviewed plan for ongoing surveillance and purpose of upcoming survivorship visit. Based on surveillance plan, moved [name] scheduled appointment to be seen in Survivorship to November 2016, one week after her mammogram.  Will attempt to coordinate scheduling of her DEXA scan for the same date as her mammogram.  Mailed [name] a copy of new appointment.  She is in agreement with this plan.

## 2015-01-24 ENCOUNTER — Encounter: Payer: Commercial Managed Care - HMO | Admitting: Nurse Practitioner

## 2015-01-30 ENCOUNTER — Ambulatory Visit (INDEPENDENT_AMBULATORY_CARE_PROVIDER_SITE_OTHER): Payer: Commercial Managed Care - HMO | Admitting: Family Medicine

## 2015-01-30 ENCOUNTER — Telehealth: Payer: Self-pay | Admitting: Family Medicine

## 2015-01-30 ENCOUNTER — Encounter: Payer: Self-pay | Admitting: Family Medicine

## 2015-01-30 VITALS — BP 132/78 | HR 85 | Temp 97.7°F | Ht 61.5 in | Wt 185.6 lb

## 2015-01-30 DIAGNOSIS — E1140 Type 2 diabetes mellitus with diabetic neuropathy, unspecified: Secondary | ICD-10-CM | POA: Diagnosis not present

## 2015-01-30 DIAGNOSIS — I10 Essential (primary) hypertension: Secondary | ICD-10-CM

## 2015-01-30 DIAGNOSIS — Z23 Encounter for immunization: Secondary | ICD-10-CM

## 2015-01-30 DIAGNOSIS — Z6834 Body mass index (BMI) 34.0-34.9, adult: Secondary | ICD-10-CM

## 2015-01-30 NOTE — Patient Instructions (Signed)
BEFORE YOU LEAVE: -flu vaccine -prevnar 13 -schedule Annual Exam - wellness visit in 3 months; come fasting but drink plenty of water  We recommend the following healthy lifestyle measures: - eat a healthy diet consisting of small portions of vegetables, fruits, beans, nuts, seeds, healthy meats such as white chicken and fish and whole grains.  - avoid sweets, white starches, fried foods, fast food, processed foods, sodas, red meet and other fattening foods.  - get a least 150-300 minutes of aerobic exercise per week.   Consider Tai Chi or physical therapy to help with balance. Use cane at all times.

## 2015-01-30 NOTE — Telephone Encounter (Signed)
I use 2 slots to make a 30 minute appt for this pt to have a physical in Dec per Dr Maudie Mercury

## 2015-01-30 NOTE — Progress Notes (Signed)
Pre visit review using our clinic review tool, if applicable. No additional management support is needed unless otherwise documented below in the visit note. 

## 2015-01-30 NOTE — Addendum Note (Signed)
Addended by: Agnes Lawrence on: 01/30/2015 09:37 AM   Modules accepted: Orders

## 2015-01-30 NOTE — Progress Notes (Signed)
HPI:  DM: -complications: ? some mild peripheral neuropathy tingling in feet at times -changed to [place] 02/2014 -continues to take metformin -last eye exam: UTD -diet poor, no exercise -denies: polyuria, polydipsia, vision changes, hypoglycemia  HTN: -meds: amlodipine 32m, she decided to restart lisinopril-hctz on her own due to swelling in legs and this resolved (the diuretic had been stopped due to hypokalemia) -denies: CP, SOB, DOE, palpitations  Urinary incontinence: -meds: oxybutynin, stable  OA of the knees/chronic back pain: -followed by Dr. APeri Marisoffice -reports chronic intermittent back pain, severe at times - hospitalized overnight for this before her surgery - occ tingling in bilat fingers and feet, she has chronic bilat shoulder pain and neck pain also -s/p LTK 07/2014 -chronic balance issues worse after knee surgery  RLS: -meds: mirapex  Hx of Breast Ca: -managed by Dr. KHumphrey Rollsin [place]-meds: arimidex    ROS: See pertinent positives and negatives per HPI.  Past Medical History  Diagnosis Date  . Hypertension   . Arthritis   . Hearing loss   . History of radiation therapy 04/29/10-05/05/10    right breast 34Gy/189f . Actinic keratosis 07/18/2008    Qualifier: Diagnosis of  By: JeArnoldo MoraleD, JoBalinda Quails . Closed fracture of one or more phalanges of foot 03/29/2008    Qualifier: Diagnosis of  By: JeArnoldo MoraleD, JoBalinda Quails . No blood products (Jehovah Witness)  09/11/2013  . RESTLESS LEGS SYNDROME 05/05/2007    Qualifier: Diagnosis of  By: ClGwenette GreetD, KeArmando Reichert . Radiation 05/05/2010    Right Breast mammosite  . Sleep apnea   . History of environmental allergies   . Diabetes mellitus     type 2  . Anxiety     occasional  . History of kidney stones 20 years ago  . Falls frequently     "can not pick left leg up"  . Cancer dx'd 2012    right breast --xrt    Past Surgical History  Procedure Laterality Date  . Flex signoidoscopy    . Incision and drainage  perirectal abscess    . Tubal ligation  1979  . Breast surgery  2012    rt breast lumpectomy  . Appendectomy  age 74. Joint replacement Right 2006    knee  . Total knee arthroplasty Left 08/06/2014    Procedure: LEFT TOTAL KNEE ARTHROPLASTY;  Surgeon: FrGaynelle ArabianMD;  Location: [facility];  Service: Orthopedics;  Laterality: Left;    Family History  Problem Relation Age of Onset  . Cancer Sister     breast  . Heart disease Paternal Grandfather   . Diabetes Other   . Obesity Other   . Heart disease Brother     Valve    Social History   Social History  . Marital Status: Married    Spouse Name: N/A  . Number of Children: 3  . Years of Education: N/A   Social History Main Topics  . Smoking status: Never Smoker   . Smokeless tobacco: Never Used  . Alcohol Use: No  . Drug Use: No  . Sexual Activity: Yes   Other Topics Concern  . None   Social History Narrative         Home Situation: lives with husband and granddaughter      Spiritual Beliefs: Jehovah Witness - no blood products  Current outpatient prescriptions:  .  amLODipine (NORVASC) 10 MG tablet, Take 1 tablet (10 mg total) by mouth daily., Disp: 90 tablet, Rfl: 1 .  anastrozole (ARIMIDEX) 1 MG tablet, TAKE ONE TABLET BY MOUTH ONCE DAILY, Disp: 30 tablet, Rfl: 5 .  diazepam (VALIUM) 5 MG tablet, Take 5 mg by mouth every 6 (six) hours as needed for anxiety., Disp: , Rfl:  .  diphenhydramine-acetaminophen (TYLENOL PM) 25-500 MG TABS, Take 1 tablet by mouth at bedtime as needed (for pain /sleep)., Disp: , Rfl:  .  glucose blood (ACCU-CHEK AVIVA) test strip, Test once per day, Disp: 100 each, Rfl: 1 .  HYDROcodone-acetaminophen (NORCO) 7.5-325 MG per tablet, Take 1-2 tablets by mouth every 4 (four) hours as needed for moderate pain., Disp: 90 tablet, Rfl: 0 .  Lancets (ACCU-CHEK SOFT TOUCH) lancets, Use as instructed to check blood sugar, Disp: 100 each, Rfl: 1 .   lisinopril-hydrochlorothiazide (PRINZIDE,ZESTORETIC) 20-25 MG per tablet, Take 1 tablet by mouth at bedtime., Disp: 90 tablet, Rfl: 3 .  metFORMIN (GLUCOPHAGE) 1000 MG tablet, TAKE ONE TABLET BY MOUTH TWICE DAILY, Disp: 180 tablet, Rfl: 0 .  methocarbamol (ROBAXIN) 500 MG tablet, Take 1 tablet (500 mg total) by mouth every 6 (six) hours as needed for muscle spasms., Disp: 80 tablet, Rfl: 0 .  oxybutynin (DITROPAN) 5 MG tablet, TAKE ONE TABLET BY MOUTH TWICE DAILY, Disp: 60 tablet, Rfl: 2 .  oxyCODONE-acetaminophen (PERCOCET/ROXICET) 5-325 MG per tablet, Take by mouth every 6 (six) hours as needed., Disp: , Rfl: 0 .  pramipexole (MIRAPEX) 1 MG tablet, Take 1 tablet (1 mg total) by mouth 2 (two) times daily., Disp: 60 tablet, Rfl: 0 .  rivaroxaban (XARELTO) 10 MG TABS tablet, Take 1 tablet (10 mg total) by mouth daily with breakfast. Take Xarelto for two and a half more weeks, then discontinue Xarelto. Once the patient has completed the blood thinner regimen, then take a Baby 81 mg Aspirin daily for three more weeks., Disp: 19 tablet, Rfl: 0 .  traMADol (ULTRAM) 50 MG tablet, Take 1-2 tablets (50-100 mg total) by mouth every 6 (six) hours as needed (mild pain)., Disp: 80 tablet, Rfl: 1  EXAM:  Filed Vitals:   01/30/15 0855  BP: 132/78  Pulse: 85  Temp: 97.7 F (36.5 C)    Body mass index is 34.51 kg/(m^2).  GENERAL: vitals reviewed and listed above, alert, oriented, appears well hydrated and in no acute distress  HEENT: atraumatic, conjunttiva clear, no obvious abnormalities on inspection of external nose and ears  NECK: no obvious masses on inspection  LUNGS: clear to auscultation bilaterally, no wheezes, rales or rhonchi, good air movement  CV: HRRR, no peripheral edema  MS: moves all extremities without noticeable abnormality  PSYCH: pleasant and cooperative, no obvious depression or anxiety  ASSESSMENT AND PLAN:  Discussed the following assessment and plan:  Type 2 diabetes  mellitus with diabetic neuropathy  BMI 34.0-34.9,adult  Essential hypertension  -Patient advised to return or notify a doctor immediately if symptoms worsen or persist or new concerns arise.  Patient Instructions  BEFORE YOU LEAVE: -flu vaccine -prevnar 13 -schedule Annual Exam - wellness visit in 3 months; come fasting but drink plenty of water  We recommend the following healthy lifestyle measures: - eat a healthy diet consisting of small portions of vegetables, fruits, beans, nuts, seeds, healthy meats such as white chicken and fish and whole grains.  - avoid sweets, white starches, fried foods, fast food, processed foods, sodas, red  meet and other fattening foods.  - get a least 150-300 minutes of aerobic exercise per week.   Consider Tai Chi or physical therapy to help with balance. Use cane at all times.     Colin Benton R.

## 2015-02-04 ENCOUNTER — Other Ambulatory Visit: Payer: Self-pay | Admitting: Family Medicine

## 2015-02-06 ENCOUNTER — Other Ambulatory Visit: Payer: Self-pay | Admitting: Family Medicine

## 2015-04-02 ENCOUNTER — Other Ambulatory Visit: Payer: Self-pay | Admitting: Family Medicine

## 2015-04-05 ENCOUNTER — Other Ambulatory Visit: Payer: Self-pay | Admitting: Family Medicine

## 2015-04-05 ENCOUNTER — Ambulatory Visit: Payer: Commercial Managed Care - HMO | Admitting: Hematology and Oncology

## 2015-04-08 ENCOUNTER — Other Ambulatory Visit: Payer: Self-pay | Admitting: Radiation Oncology

## 2015-04-08 ENCOUNTER — Ambulatory Visit
Admission: RE | Admit: 2015-04-08 | Discharge: 2015-04-08 | Disposition: A | Discharge status: Home / Self Care | Payer: Commercial Managed Care - HMO | Source: Ambulatory Visit | Attending: Radiation Oncology | Admitting: Radiation Oncology

## 2015-04-08 ENCOUNTER — Ambulatory Visit
Admission: RE | Admit: 2015-04-08 | Discharge: 2015-04-08 | Disposition: A | Discharge status: Home / Self Care | Payer: Commercial Managed Care - HMO | Source: Ambulatory Visit | Attending: Hematology and Oncology | Admitting: Hematology and Oncology

## 2015-04-08 DIAGNOSIS — C50911 Malignant neoplasm of unspecified site of right female breast: Secondary | ICD-10-CM

## 2015-04-08 DIAGNOSIS — M85851 Other specified disorders of bone density and structure, right thigh: Secondary | ICD-10-CM | POA: Diagnosis not present

## 2015-04-08 DIAGNOSIS — R928 Other abnormal and inconclusive findings on diagnostic imaging of breast: Secondary | ICD-10-CM | POA: Diagnosis not present

## 2015-04-15 ENCOUNTER — Telehealth: Payer: Self-pay | Admitting: Family Medicine

## 2015-04-15 DIAGNOSIS — C50911 Malignant neoplasm of unspecified site of right female breast: Secondary | ICD-10-CM

## 2015-04-15 NOTE — Telephone Encounter (Signed)
Ok to place

## 2015-04-15 NOTE — Telephone Encounter (Signed)
Referral entered  

## 2015-04-15 NOTE — Telephone Encounter (Signed)
Lanice from [facility] called saying Ms. Whitehurst has an appointment there tomorrow and they need a referral sent to the office.  Dominga Lammons Dob: Jan 27, 1941 Member #: [id number] Provider: She's seeing Chestine Spore, PA who's under Nicholas Lose - NPI [id number] Appointment: 11.29.16 @ 12:00pm Diagnosis: C50.911  Please call Lanise at the [facility] if you have questions, ph# [phone] Thank you.

## 2015-04-16 ENCOUNTER — Ambulatory Visit (HOSPITAL_BASED_OUTPATIENT_CLINIC_OR_DEPARTMENT_OTHER): Payer: Commercial Managed Care - HMO | Admitting: Nurse Practitioner

## 2015-04-16 ENCOUNTER — Encounter: Payer: Self-pay | Admitting: Nurse Practitioner

## 2015-04-16 ENCOUNTER — Telehealth: Payer: Self-pay | Admitting: Nurse Practitioner

## 2015-04-16 VITALS — BP 148/64 | HR 84 | Temp 97.9°F | Resp 18 | Ht 61.5 in | Wt 198.3 lb

## 2015-04-16 DIAGNOSIS — C50911 Malignant neoplasm of unspecified site of right female breast: Secondary | ICD-10-CM | POA: Diagnosis not present

## 2015-04-16 DIAGNOSIS — Z170 Estrogen receptor positive status [ER+]: Secondary | ICD-10-CM | POA: Diagnosis not present

## 2015-04-16 NOTE — Progress Notes (Signed)
CLINIC:  Cancer Survivorship   REASON FOR VISIT:  Routine follow-up post-treatment for history of breast cancer.  BRIEF ONCOLOGIC HISTORY:    Breast cancer, right breast ([place])   03/12/2010 Initial Diagnosis Invasive lobular cancer, ER 100%, PR 88% Ki-67 13%, HER-2 negative ratio 1.3   04/23/2010 Surgery Right breast lumpectomy invasive ductal carcinoma grade 1, 1 cm size; one sentinel lymph node negative for malignancy   04/28/2010 - 05/05/2010 Radiation Therapy Accelerated partial breast radiation with MammoSite   05/27/2010 -  Anti-estrogen oral therapy Arimidex 1 mg daily    INTERVAL HISTORY:  [name] presents to the [facility] today for ongoing follow up regarding her history of breast cancer. Overall, [name] reports doing well since her last visit. Her husband has had several health concerns and she has been quite busy with him and her granddaughter. She continues on her anastrozole and is tolerating it without significant side effects, although she has recently misplaced her bottle and not taken it over the last few days. She denies any change in her breast or new mass or nodularity in her left breast.  She states that she will on occasion bump into things with her right breast and it can be tender afterwards; but otherwise, she denies breast pain. She has some back pain which predates her cancer diagnosis and is relieved by the use of ice.  [name] denies any headache or cough.  She does have mild fatigue, which is no worse. She reports a good appetite and denies any weight loss.    REVIEW OF SYSTEMS:  General: Denies fever, chills, or night sweats. HEENT: Wears glasses and bilateral hearing aids. Dry mouth on occasion. Denies visual changes, mouth sores, or difficulty swallowing. Cardiac: Denies palpitations and lower extremity edema.  Respiratory: Mild dyspnea on exertion. Denies wheeze.  Breast: Denies any new nodularity, masses, tenderness, nipple changes, or  nipple discharge.  GI: Denies abdominal pain, constipation, diarrhea, nausea, or vomiting.  GU: Intermittent urinary incontinence. Denies dysuria, hematuria, vaginal bleeding, vaginal discharge, or vaginal dryness.  Musculoskeletal: Back pain as above. Denies joint or bone pain.  Neuro: Denies recent fall or numbness / tingling in her extremities. Skin: Denies pruritis, or open wounds.  Psych: Occasional anxiety and depression due to stressors with family.  No suicidal or homocidal ideation.  Denies insomnia, or memory loss.   A 14-point review of systems was completed and was negative, except as noted above.   ONCOLOGY TREATMENT TEAM:  1. Surgeon:  Dr. Excell Seltzer at [facility]  2. Medical Oncologist: Dr. Lindi Adie 3. Radiation Oncologist: Dr. Pablo Ledger    PAST MEDICAL/SURGICAL HISTORY:  Past Medical History  Diagnosis Date  . Hypertension   . Arthritis   . Hearing loss   . History of radiation therapy 04/29/10-05/05/10    right breast 34Gy/49fx  . Actinic keratosis 07/18/2008    Qualifier: Diagnosis of  By: Arnoldo Morale MD, Balinda Quails   . Closed fracture of one or more phalanges of foot 03/29/2008    Qualifier: Diagnosis of  By: Arnoldo Morale MD, Balinda Quails   . No blood products (Jehovah Witness)  09/11/2013  . RESTLESS LEGS SYNDROME 05/05/2007    Qualifier: Diagnosis of  By: Gwenette Greet MD, Armando Reichert   . Radiation 05/05/2010    Right Breast mammosite  . Sleep apnea   . History of environmental allergies   . Diabetes mellitus     type 2  . Anxiety     occasional  . History  of kidney stones 20 years ago  . Falls frequently     "can not pick left leg up"  . Cancer [facility]) dx'd 2012    right breast --xrt   Past Surgical History  Procedure Laterality Date  . Flex signoidoscopy    . Incision and drainage perirectal abscess    . Tubal ligation  1979  . Breast surgery  2012    rt breast lumpectomy  . Appendectomy  age 47  . Joint replacement Right 2006    knee  . Total knee arthroplasty  Left 08/06/2014    Procedure: LEFT TOTAL KNEE ARTHROPLASTY;  Surgeon: Gaynelle Arabian, MD;  Location: [facility];  Service: Orthopedics;  Laterality: Left;     ALLERGIES:  Allergies  Allergen Reactions  . Other     BLOOD PRODUCT REFUSAL  . Sulfonamide Derivatives Swelling     CURRENT MEDICATIONS:  Current Outpatient Prescriptions on File Prior to Visit  Medication Sig Dispense Refill  . amLODipine (NORVASC) 10 MG tablet TAKE ONE TABLET BY MOUTH ONCE DAILY 90 tablet 0  . diphenhydramine-acetaminophen (TYLENOL PM) 25-500 MG TABS Take 1 tablet by mouth at bedtime as needed (for pain /sleep).    Marland Kitchen glucose blood (ACCU-CHEK AVIVA) test strip Test once per day 100 each 1  . Lancets (ACCU-CHEK SOFT TOUCH) lancets Use as instructed to check blood sugar 100 each 1  . lisinopril-hydrochlorothiazide (PRINZIDE,ZESTORETIC) 20-25 MG per tablet Take 1 tablet by mouth at bedtime. 90 tablet 3  . metFORMIN (GLUCOPHAGE) 1000 MG tablet TAKE ONE TABLET BY MOUTH TWICE DAILY 180 tablet 0  . oxyCODONE-acetaminophen (PERCOCET/ROXICET) 5-325 MG per tablet Take by mouth every 6 (six) hours as needed.  0  . pramipexole (MIRAPEX) 1 MG tablet TAKE ONE TABLET BY MOUTH TWICE DAILY 180 tablet 0  . anastrozole (ARIMIDEX) 1 MG tablet TAKE ONE TABLET BY MOUTH ONCE DAILY (Patient not taking: Reported on 04/16/2015) 30 tablet 5  . diazepam (VALIUM) 5 MG tablet Take 5 mg by mouth every 6 (six) hours as needed for anxiety.    Marland Kitchen HYDROcodone-acetaminophen (NORCO) 7.5-325 MG per tablet Take 1-2 tablets by mouth every 4 (four) hours as needed for moderate pain. (Patient not taking: Reported on 04/16/2015) 90 tablet 0  . rivaroxaban (XARELTO) 10 MG TABS tablet Take 1 tablet (10 mg total) by mouth daily with breakfast. Take Xarelto for two and a half more weeks, then discontinue Xarelto. Once the patient has completed the blood thinner regimen, then take a Baby 81 mg Aspirin daily for three more weeks. (Patient not taking: Reported on  04/16/2015) 19 tablet 0  . traMADol (ULTRAM) 50 MG tablet Take 1-2 tablets (50-100 mg total) by mouth every 6 (six) hours as needed (mild pain). (Patient not taking: Reported on 04/16/2015) 80 tablet 1   No current facility-administered medications on file prior to visit.     ONCOLOGIC FAMILY HISTORY:  Family History  Problem Relation Age of Onset  . Cancer Sister     breast  . Heart disease Paternal Grandfather   . Diabetes Other   . Obesity Other   . Heart disease Brother     Valve     SOCIAL HISTORY:  [name] is married and lives with her husband and 1 year old granddaughter in [place],  [place].  She has 3 children. [name] is currently not working.  She denies any current or history of tobacco, alcohol, or illicit drug use.     PHYSICAL EXAMINATION:  Vital Signs: Filed Vitals:   04/16/15 1215  BP: 148/64  Pulse: 84  Temp: 97.9 F (36.6 C)  Resp: 18   ECOG performance status: 1 General: Well-nourished, well-appearing female in no acute distress.  She is unaccompanied in clinic today.   HEENT: Head is atraumatic and normocephalic.  Pupils equal and reactive to light and accomodation. Conjunctivae clear without exudate.  Sclerae anicteric. Oral mucosa is pink, moist, and intact without lesions.  Oropharynx is pink without lesions or erythema.  Lymph: No cervical, supraclavicular, infraclavicular, or axillary lymphadenopathy noted on palpation.  Cardiovascular: Regular rate and rhythm without murmurs, rubs, or gallops. Respiratory: Clear to auscultation bilaterally. Chest expansion symmetric without accessory muscle use on inspiration or expiration.  Breast: Bilateral breast exam performed.  Thickening in right breast secondary to Mammosite, stable.  No tenderness.  No palpable mass or nodularity in left breast. GI: Abdomen soft and round. No tenderness to palpation. Bowel sounds normoactive in 4 quadrants. No hepatosplenomegaly.   GU: Deferred.    Musculoskeletal: Muscle strength 5/5 in all extremities.   Neuro: No focal deficits. Steady gait.  Psych: Mood and affect normal and appropriate for situation.  Extremities: No edema, cyanosis, or clubbing.  Skin: Warm and dry. No open lesions noted.   LABORATORY DATA:  No results found for this or any previous visit (from the past 2160 hour(s)).  DIAGNOSTIC IMAGING: Bilateral diagnostic mammogram performed April 08, 2015 reveals no suspicious masses, area of architectural distortion or microcalcifications in either breast. There are stable post lumpectomy and post treatment changes in the right breast.     ASSESSMENT AND PLAN:   1. 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  Ms. Cunningham is doing well with no clinical symptoms worrisome for cancer recurrence at this time. I have reviewed the recommendations for ongoing surveillance with her and she will follow-up with her medical oncologist,  Dr. Lindi Adie, in June 2017 with history and physical exam per surveillance protocol.  She will be due mammography in November 2017. She will continue her endocrine therapy with anastrozole as prescribed by  Dr. Lindi Adie. We have discussed the potential of continuing her anastrozole therapy beyond five years based on emerging data on its benefits. She is willing to proceed with this and will discuss further with Dr. Lindi Adie at her appointment in six months. She was instructed to make Dr. Lindi Adie or myself aware if she notes any change within her breast, any new symptoms such as pain, shortness of breath, weight loss, or fatigue.  She will also report any new or increased side effects of the endocrine therapy or any difficulties with it.  2. Bone  health:  Given [name] age/history of breast cancer and her current treatment regimen including endocrine therapy with anastrozole, she is at risk for bone demineralization.  Per our records, her last DEXA scan was performed in November 2016 revealing continued osteopenia with no statistically significant difference in bone mineral density since her previous scan in 2014. We will continue to monitor this closely for any further changes as a result of endocrine therapy.  In the meantime, she was encouraged to increase her consumption of foods rich in calcium and vitamin D as well as to increase her weight-bearing activities.  She was given education on  specific activities to promote bone health.  3. Cancer screening:  Due to [name]'s history and her age, she should receive screening for skin cancers, colon cancer, and gynecologic cancers.  The information and recommendations were shared with the patient and in her written after visit summary.  4. Health maintenance and wellness promotion: We discussed recommendations to maximize nutrition and minimize recurrence, such as increased intake of fruits, vegetables, lean proteins, and minimizing the intake of red meats and processed foods.  She was also encouraged to engage in moderate to vigorous exercise for 30 minutes per day most days of the week. She was instructed to limit her alcohol consumption and continue to abstain from tobacco use. The information and recommendations were shared with the patient and in her written after visit summary.    A total of 30 minutes of face-to-face time was spent with this patient with greater than 50% of that time in counseling and care-coordination.   Sylvan Cheese, NP  Survivorship Program [facility] [phone]   Note: PRIMARY CARE PROVIDER [name]., [place] [phone]

## 2015-04-16 NOTE — Telephone Encounter (Signed)
per pof to sch pt appt-gae pt copy of avs

## 2015-04-16 NOTE — Patient Instructions (Signed)
Thank you for coming in today!  As we discussed, start back on your anastrozole as soon as you can.  You will come back in 6 months to see Dr. Lindi Adie and will be due your mammogram in November 2017.  Please let us know if you develop new symptoms or any change. Good luck with everything going on - will be thinking of you! Look forward to working with you!  Symptoms to Watch for and Report to Your Provider  . Return of the cancer symptoms you had before- such as a lump or new growth where your cancer first started . New or unusual pain that seems unrelated to an injury and does not go away, including back pain or bone pain . Weight loss without trying/intending . Unexplained bleeding . A rash or allergic reaction, such as swelling, severe itching or wheezing . Chills or fevers . Persistent headaches . Shortness of breath or difficulty breathing . Bloody stools or blood in your urine . Lumps, bumps, swelling and/or nipple discharge . Nausea, vomiting, diarrhea, loss of appetite, or trouble swallowing . A cough that doesn't go away . Abdominal pain . Swelling in your arms or legs . Fractures . Hot flashes or other menopausal symptoms . Any other signs mentioned by your doctor or nurse or any unusual symptoms                 that you just can't explain   NOTE: Just because you have certain symptoms, it doesn't mean the cancer has come back or you have a new cancer. Symptoms can be due to other problems that need to be addressed.  It is important to watch for these symptoms and report them to your provider so you can be medically evaluated for any of these concerns!    Living a Life of Wellness After Cancer:  *Note: Please consult your health care provider before using any medications, supplements, over-the-counter products, or other interventions.  Also, please consult your primary care provider before you begin any lifestyle program (diet, exercise, etc.).  Your safety is our top priority and we  want to make sure you continue to live a long and healthy life!    Healthy Lifestyle Recommendations  As a cancer survivor, it is important develop a lifelong commitment to a healthy lifestyle. A healthy lifestyle can prevent cancer from returning as well as prevent other diseases like heart disease, diabetes and high blood pressure.  These are some things that you can do to have a healthy lifestyle:  Marland Kitchen Maintain a healthy weight.  . Exercise daily per your doctor's orders. . Eat a balanced diet high in fruits, vegetables, bran, and fiber. Limit intake of red meat      and processed foods.  . Limit how much alcohol you consume, if at all. Ali Lowe regular bone mineral density testing for osteoporosis.  . Talk to your doctor about cardiovascular disease or "heart disease" screening. . Stop smoking (if you smoke). . Know your family history. . Be mindful of your emotional, social, and spiritual needs. . Meet regularly with a Primary Care Provider (PCP). Find a PCP if you do not             already have one. . Talk to your doctor about regular cancer screening including screening for colon           cancer, GYN cancers, and skin cancer.

## 2015-04-18 ENCOUNTER — Ambulatory Visit (INDEPENDENT_AMBULATORY_CARE_PROVIDER_SITE_OTHER): Payer: Commercial Managed Care - HMO | Admitting: Family Medicine

## 2015-04-18 ENCOUNTER — Encounter: Payer: Self-pay | Admitting: Family Medicine

## 2015-04-18 VITALS — BP 118/76 | HR 86 | Temp 97.6°F | Ht 61.5 in | Wt 198.2 lb

## 2015-04-18 DIAGNOSIS — F330 Major depressive disorder, recurrent, mild: Secondary | ICD-10-CM

## 2015-04-18 DIAGNOSIS — Z0000 Encounter for general adult medical examination without abnormal findings: Secondary | ICD-10-CM

## 2015-04-18 DIAGNOSIS — E669 Obesity, unspecified: Secondary | ICD-10-CM

## 2015-04-18 DIAGNOSIS — G2581 Restless legs syndrome: Secondary | ICD-10-CM

## 2015-04-18 DIAGNOSIS — E1140 Type 2 diabetes mellitus with diabetic neuropathy, unspecified: Secondary | ICD-10-CM

## 2015-04-18 DIAGNOSIS — R350 Frequency of micturition: Secondary | ICD-10-CM

## 2015-04-18 DIAGNOSIS — I10 Essential (primary) hypertension: Secondary | ICD-10-CM

## 2015-04-18 DIAGNOSIS — M1990 Unspecified osteoarthritis, unspecified site: Secondary | ICD-10-CM

## 2015-04-18 LAB — BASIC METABOLIC PANEL
BUN: 16 mg/dL (ref 6–23)
CALCIUM: 9.3 mg/dL (ref 8.4–10.5)
CO2: 33 mEq/L — ABNORMAL HIGH (ref 19–32)
Chloride: 96 mEq/L (ref 96–112)
Creatinine, Ser: 0.55 mg/dL (ref 0.40–1.20)
GFR: 114.2 mL/min (ref >60.00)
Glucose, Bld: 181 mg/dL — ABNORMAL HIGH (ref 70–99)
Potassium: 3.7 mEq/L (ref 3.5–5.1)
SODIUM: 138 meq/L (ref 135–145)

## 2015-04-18 LAB — HEMOGLOBIN A1C: HEMOGLOBIN A1C: 8.2 % — AB (ref 4.6–6.5)

## 2015-04-18 MED ORDER — GLUCOSE BLOOD VI STRP: ORAL_STRIP | Status: DC

## 2015-04-18 MED ORDER — LISINOPRIL-HYDROCHLOROTHIAZIDE 20-12.5 MG PO TABS: 1.0000 | ORAL_TABLET | Freq: Every day | ORAL | Status: DC

## 2015-04-18 MED ORDER — ACCU-CHEK SOFT TOUCH LANCETS MISC: Status: DC

## 2015-04-18 NOTE — Patient Instructions (Addendum)
BEFORE YOU LEAVE: -labs -follow up appointment in 4 months  Please complete the stool cards and return  Ensure Vit D3 [phone] IU daily and regular weight bearing exercise  Please schedule your yearly diabetic eye exam  We have ordered labs or studies at this visit. It can take up to 1-2 weeks for results and processing. We will contact you with instructions IF your results are abnormal. Normal results will be released to your [facility]. If you have not heard from [place] or can not find your results in [facility] in 2 weeks please contact our office.  We recommend the following healthy lifestyle measures: - eat a healthy whole foods diet consisting of regular small meals composed of vegetables, fruits, beans, nuts, seeds, healthy meats such as white chicken and fish and whole grains.  - avoid sweets, white starchy foods, fried foods, fast food, processed foods, sodas, red meet and other fattening foods.  - get a least 150-300 minutes of aerobic exercise per week.   Timed voiding, pelvic floor exercises and Always Incontinence Underwear may help with urinary symptoms - we also reduced the diuretic portion of your blood pressure medication which may help

## 2015-04-18 NOTE — Progress Notes (Signed)
Medicare Annual Preventive Care Visit  (initial annual wellness or annual wellness exam)  Concerns and/or follow up today:  DM: -complications: ? some mild peripheral neuropathy tingling in feet at times -changed to [place] 02/2014 -continues to take metformin -last eye exam: UTD -diet poor, no exercise -denies: polyuria, polydipsia, vision changes, hypoglycemia  HTN: -meds: amlodipine 10mg ,  lisinopril-hctz (the diuretic had been stopped in the past due to hypokalemia) -denies: CP, SOB, DOE, palpitations  Urinary incontinence: ->5-10 years, unchanged -trouble making it to the toilet -meds: she stopped the oxybutynin - reports prior PCP tried many medications and they don't really help  OA of the knees/chronic back pain: -followed by Dr. Peri Maris office -hx chronic intermittent back pain -s/p LTK 07/2014 -chronic balance issues worse after knee surgery  RLS: -meds: mirapex  Hx of Breast Ca: -managed by Dr. Humphrey Rolls in [place] -meds: arimidex  MDD: -reports has always had mild depression -dealing with a lot at home with husband's health and 79 yo grandaughter -denies: thoughts of self harm, panic, manic symptoms -she is not ready to try treatments in terms of medications or CBT -feels safe  ROS: negative for report of fevers, unintentional weight loss, vision changes, vision loss, hearing loss or change, chest pain, sob, hemoptysis, melena, hematochezia, hematuria, genital discharge or lesions, falls, bleeding or bruising, loc, thoughts of suicide or self harm, memory loss  1.) Patient-completed health risk assessment  - completed and reviewed, see scanned documentation  2.) Review of Medical History: -PMH, PSH, Family History and current specialty and care providers reviewed and updated and listed below  - see scanned in document in chart and below  Past Medical History  Diagnosis Date  . Hypertension   . Arthritis   . Hearing loss   . History of radiation therapy  04/29/10-05/05/10    right breast 34Gy/68fx  . Actinic keratosis 07/18/2008    Qualifier: Diagnosis of  By: Arnoldo Morale MD, Balinda Quails   . Closed fracture of one or more phalanges of foot 03/29/2008    Qualifier: Diagnosis of  By: Arnoldo Morale MD, Balinda Quails   . No blood products ([employer])  09/11/2013  . RESTLESS LEGS SYNDROME 05/05/2007    Qualifier: Diagnosis of  By: Gwenette Greet MD, Armando Reichert   . Radiation 05/05/2010    Right Breast mammosite  . Sleep apnea   . History of environmental allergies   . Diabetes mellitus     type 2  . Anxiety     occasional  . History of kidney stones 20 years ago  . Falls frequently     "can not pick left leg up"  . Cancer [facility]) dx'd 2012    right breast --xrt    Past Surgical History  Procedure Laterality Date  . Flex signoidoscopy    . Incision and drainage perirectal abscess    . Tubal ligation  1979  . Breast surgery  2012    rt breast lumpectomy  . Appendectomy  age 67  . Joint replacement Right 2006    knee  . Total knee arthroplasty Left 08/06/2014    Procedure: LEFT TOTAL KNEE ARTHROPLASTY;  Surgeon: Gaynelle Arabian, MD;  Location: [facility];  Service: Orthopedics;  Laterality: Left;    Social History   Social History  . Marital Status: Married    Spouse Name: N/A  . Number of Children: 3  . Years of Education: N/A   Occupational History  . Not on file.   Social History Main Topics  .  Smoking status: Never Smoker   . Smokeless tobacco: Never Used  . Alcohol Use: No  . Drug Use: No  . Sexual Activity: Yes   Other Topics Concern  . Not on file   Social History Narrative         Home Situation: lives with husband and granddaughter      Spiritual Beliefs: Jehovah Witness - no blood products                   Family History  Problem Relation Age of Onset  . Cancer Sister     breast  . Heart disease Paternal Grandfather   . Diabetes Other   . Obesity Other   . Heart disease Brother     Valve    Current Outpatient  Prescriptions on File Prior to Visit  Medication Sig Dispense Refill  . amLODipine (NORVASC) 10 MG tablet TAKE ONE TABLET BY MOUTH ONCE DAILY 90 tablet 0  . anastrozole (ARIMIDEX) 1 MG tablet TAKE ONE TABLET BY MOUTH ONCE DAILY 30 tablet 5  . diazepam (VALIUM) 5 MG tablet Take 5 mg by mouth every 6 (six) hours as needed for anxiety.    . diphenhydramine-acetaminophen (TYLENOL PM) 25-500 MG TABS Take 1 tablet by mouth at bedtime as needed (for pain /sleep).    Marland Kitchen glucose blood (ACCU-CHEK AVIVA) test strip Test once per day 100 each 1  . HYDROcodone-acetaminophen (NORCO) 7.5-325 MG per tablet Take 1-2 tablets by mouth every 4 (four) hours as needed for moderate pain. 90 tablet 0  . Lancets (ACCU-CHEK SOFT TOUCH) lancets Use as instructed to check blood sugar 100 each 1  . metFORMIN (GLUCOPHAGE) 1000 MG tablet TAKE ONE TABLET BY MOUTH TWICE DAILY 180 tablet 0  . oxyCODONE-acetaminophen (PERCOCET/ROXICET) 5-325 MG per tablet Take by mouth every 6 (six) hours as needed.  0  . pramipexole (MIRAPEX) 1 MG tablet TAKE ONE TABLET BY MOUTH TWICE DAILY 180 tablet 0   No current facility-administered medications on file prior to visit.     3.) Review of functional ability and level of safety:  Any difficulty hearing? YES wear hearing aids  History of falling?  NO  Any trouble with IADLs - using a phone, using transportation, grocery shopping, preparing meals, doing housework, doing laundry, taking medications and managing money? NO  Advance Directives? YES   See summary of recommendations in Patient Instructions below.  4.) Physical Exam Filed Vitals:   04/18/15 0903  BP: 118/76  Pulse: 86  Temp: 97.6 F (36.4 C)   Estimated body mass index is 36.85 kg/(m^2) as calculated from the following:   Height as of this encounter: 5' 1.5" (1.562 m).   Weight as of this encounter: 198 lb 3.2 oz (89.903 kg).  EKG (optional): deferred  General: alert, appear well hydrated and in no acute  distress  HEENT: visual acuity grossly intact, hearing aides, PERRLA  CV: HRRR, tr ankle edema  ABD: BS+, soft, NTTP  Lungs: CTA bilaterally  Psych: pleasant and cooperative, no obvious depression or anxiety  NEURO: Cog function grossly intact, speech and thought processing grossly intact  See patient instructions for recommendations.  Education and counseling regarding the above review of health provided with a plan for the following: -see scanned patient completed form for further details -fall prevention strategies discussed  -healthy lifestyle discussed -importance and resources for completing advanced directives discussed -see patient instructions below for any other recommendations provided  4)The following written screening schedule of preventive measures  were reviewed with assessment and plan made per below, orders and patient instructions:      AAA screening n/a     Alcohol screening done     Obesity Screening and counseling done     STI screening (Hep C if born [birth year]) declined     Tobacco Screening done       Pneumococcal (PPSV23 -one dose after 64, one before if risk factors), influenza yearly and hepatitis B vaccines (if high risk - end stage renal disease, IV drugs, homosexual men, live in home for mentally retarded, hemophilia receiving factors) ASSESSMENT/PLAN: done      Screening mammograph (yearly if >40) ASSESSMENT/PLAN: yearly and followed by cancer center      Screening Pap smear/pelvic exam (q2 years) ASSESSMENT/PLAN: normal in 2013, declined repeat or pelvic today      Prostate cancer screening ASSESSMENT/PLAN: n/a      Colorectal cancer screening (FOBT yearly or flex sig q4y or colonoscopy q10y or barium enema q4y) ASSESSMENT/PLAN: FOBT - she has refused colonoscopy in the past and agrees to FOBT but has never completed - strongly encouraged to complete      Diabetes outpatient self-management training services ASSESSMENT/PLAN: labs today       Bone mass measurements(covered q2y if indicated - estrogen def, osteoporosis, hyperparathyroid, vertebral abnormalities, osteoporosis or steroids) ASSESSMENT/PLAN: done 2016      Screening for glaucoma(q1y if high risk - diabetes, FH, AA and > 50 or hispanic and > 65) ASSESSMENT/PLAN:      Medical nutritional therapy for individuals with diabetes or renal disease ASSESSMENT/PLAN: done      Cardiovascular screening blood tests (lipids q5y) ASSESSMENT/PLAN: today      Diabetes screening tests ASSESSMENT/PLAN: n/a, follow up today   7.) Summary:   Medicare annual wellness visit, subsequent -risk factors and conditions per above assessment were discussed and treatment, recommendations and referrals were offered per documentation above and orders and patient instructions.  Type 2 diabetes mellitus with diabetic neuropathy, without long-term current use of insulin ([place]) - Plan: Hemoglobin A1c -lifestyle recs, assistant to refill meter supplies -advised to schedule diabetic eye exam  Obesity -lifestyle recs  Essential hypertension - Plan: Basic metabolic panel -stable, trial decreasing diuretic component given worse chronic urinary frequency and mild hypodal on this  URINARY FREQUENCY, CHRONIC -> 10 years -reports many meds tried with preior PCP and not helpful -discuss other options etiologies, declined pelvic exam -she opted for trial decreased diuretic and nonpharmacologic options  Osteoarthritis, unspecified osteoarthritis type, unspecified site -stale  RESTLESS LEGS SYNDROME -stable  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 It can take up to 1-2 weeks for results and processing. We will contact you with instructions IF your results are abnormal. Normal results will be released to your [facility]. If you have not heard from [place] or can not find your results in [facility] in 2 weeks please contact our office.  We recommend the following healthy lifestyle measures: - eat a healthy whole foods diet consisting of regular small meals composed of vegetables, fruits, beans, nuts, seeds, healthy meats such as white chicken and fish  and whole grains.  - avoid sweets, white starchy foods, fried foods, fast food, processed foods, sodas, red meet and other fattening foods.  - get a least 150-300 minutes of aerobic exercise per week.   Timed voiding, pelvic floor exercises and Always Incontinence Underwear may help with urinary symptoms - we also reduced the diuretic portion of your blood pressure medication which may help

## 2015-04-18 NOTE — Progress Notes (Signed)
Pre visit review using our clinic review tool, if applicable. No additional management support is needed unless otherwise documented below in the visit note. 

## 2015-04-18 NOTE — Addendum Note (Signed)
Addended by: Agnes Lawrence on: 04/18/2015 09:43 AM   Modules accepted: Orders

## 2015-04-19 ENCOUNTER — Other Ambulatory Visit: Payer: Self-pay | Admitting: Family Medicine

## 2015-05-17 ENCOUNTER — Telehealth: Payer: Self-pay | Admitting: Family Medicine

## 2015-05-17 MED ORDER — LISINOPRIL-HYDROCHLOROTHIAZIDE 20-25 MG PO TABS: 1.0000 | ORAL_TABLET | Freq: Every day | ORAL | Status: DC

## 2015-05-17 NOTE — Telephone Encounter (Signed)
She had requested a reduced diuretic as was making her pee and caused mildly low potassium. If no other symptoms and this is similar to swelling she has had in the past can try increasing back up to lisinopril-hctz  20-25 if she wishes - please discontinue current dose and send new dose. Needs follow up in 2 weeks to check. If any other symptoms or swelling worse then she has had in the past advise appointment to evaluate.

## 2015-05-17 NOTE — Telephone Encounter (Signed)
Ms. Widmeyer called saying Dr. Maudie Mercury reduced the dosage of her diuretic in the [place] in half. She said since this reduction her legs have began swelling and "she can't take it." She'd like the original dosage of the diuretic in Lisinopril added back. Please give her a phone call regarding this.  Pt's ph# [phone] Thank you.

## 2015-05-17 NOTE — Telephone Encounter (Signed)
Patient informed of the message below and states the swelling is the same as before and denies any other symptoms. She is aware the Rx was sent to her pharmacy and the follow up visit was scheduled for 05/31/15.

## 2015-05-26 ENCOUNTER — Other Ambulatory Visit: Payer: Self-pay | Admitting: Hematology and Oncology

## 2015-05-26 ENCOUNTER — Other Ambulatory Visit: Payer: Self-pay | Admitting: Family Medicine

## 2015-05-27 NOTE — Telephone Encounter (Signed)
Chart reviewed.

## 2015-05-31 ENCOUNTER — Ambulatory Visit (INDEPENDENT_AMBULATORY_CARE_PROVIDER_SITE_OTHER): Payer: Commercial Managed Care - HMO | Admitting: Family Medicine

## 2015-05-31 ENCOUNTER — Ambulatory Visit (INDEPENDENT_AMBULATORY_CARE_PROVIDER_SITE_OTHER)
Admission: RE | Admit: 2015-05-31 | Discharge: 2015-05-31 | Disposition: A | Discharge status: Home / Self Care | Payer: Commercial Managed Care - HMO | Source: Ambulatory Visit | Attending: Family Medicine | Admitting: Family Medicine

## 2015-05-31 ENCOUNTER — Encounter: Payer: Self-pay | Admitting: Family Medicine

## 2015-05-31 VITALS — BP 112/68 | HR 89 | Temp 97.6°F | Ht 61.5 in | Wt 200.5 lb

## 2015-05-31 DIAGNOSIS — M25531 Pain in right wrist: Secondary | ICD-10-CM

## 2015-05-31 DIAGNOSIS — E1140 Type 2 diabetes mellitus with diabetic neuropathy, unspecified: Secondary | ICD-10-CM | POA: Diagnosis not present

## 2015-05-31 DIAGNOSIS — R600 Localized edema: Secondary | ICD-10-CM

## 2015-05-31 DIAGNOSIS — I10 Essential (primary) hypertension: Secondary | ICD-10-CM

## 2015-05-31 DIAGNOSIS — M19031 Primary osteoarthritis, right wrist: Secondary | ICD-10-CM | POA: Diagnosis not present

## 2015-05-31 LAB — URIC ACID: Uric Acid, Serum: 3 mg/dL (ref 2.4–7.0)

## 2015-05-31 MED ORDER — SITAGLIPTIN PHOSPHATE 100 MG PO TABS: 100.0000 mg | ORAL_TABLET | Freq: Every day | ORAL | Status: DC

## 2015-05-31 NOTE — Progress Notes (Signed)
Pre visit review using our clinic review tool, if applicable. No additional management support is needed unless otherwise documented below in the visit note. 

## 2015-05-31 NOTE — Progress Notes (Addendum)
HPI:  LE edema/HTN: -increased LE edema after cutting back on her diuretic (she requested to cut back as felt it made her pee too much) -advised going back on regular dose -she reports: much better and almost resolved now that she is back on her medication -denies: orthopnea, CP, palpitations  R wrist pain: -started 5 days ago -has had this before  -swollen and painful -denies: fevers, malaise, chills, other arthragias current - has had other joint issues in the past  DM: -reports has not restarted Januvia as needs another refill  ROS: See pertinent positives and negatives per HPI.  Past Medical History  Diagnosis Date  . Hypertension   . Arthritis   . Hearing loss   . History of radiation therapy 04/29/10-05/05/10    right breast 34Gy/34fx  . Actinic keratosis 07/18/2008    Qualifier: Diagnosis of  By: Arnoldo Morale MD, Balinda Quails   . Closed fracture of one or more phalanges of foot 03/29/2008    Qualifier: Diagnosis of  By: Arnoldo Morale MD, Balinda Quails   . No blood products ([employer]eh[employer] Witness)  09/11/2013  . RESTLESS LEGS SYNDROME 05/05/2007    Qualifier: Diagnosis of  By: Gwenette Greet MD, Armando Reichert   . Radiation 05/05/2010    Right Breast mammosite  . Sleep apnea   . History of environmental allergies   . Diabetes mellitus     type 2  . Anxiety     occasional  . History of kidney stones 20 years ago  . Falls frequently     "can not pick left leg up"  . Cancer [facility]) dx'd 2012    right breast --xrt    Past Surgical History  Procedure Laterality Date  . Flex signoidoscopy    . Incision and drainage perirectal abscess    . Tubal ligation  1979  . Breast surgery  2012    rt breast lumpectomy  . Appendectomy  age 37  . Joint replacement Right 2006    knee  . 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 Cancer  Sister     breast  . Heart disease Paternal Grandfather   . Diabetes Other   . Obesity Other   . Heart disease Brother     Valve    Social History   Social History  . Marital Status: Married    Spouse Name: N/A  . Number of Children: 3  . Years of Education: N/A   Social History Main Topics  . Smoking status: Never Smoker   . Smokeless tobacco: Never Used  . Alcohol Use: No  . Drug Use: No  . Sexual Activity: Yes   Other Topics Concern  . None   Social History Narrative         Home Situation: lives with husband and granddaughter      Spiritual Beliefs: Jehovah Witness - no blood products                    Current outpatient prescriptions:  .  amLODipine (NORVASC) 10 MG tablet, TAKE ONE TABLET BY MOUTH ONCE DAILY, Disp: 90 tablet, Rfl: 1 .  anastrozole (ARIMIDEX) 1 MG tablet, TAKE ONE TABLET BY MOUTH ONCE DAILY, Disp: 30 tablet, Rfl: 4 .  diazepam (VALIUM) 5 MG tablet, Take 5 mg by mouth every  6 (six) hours as needed for anxiety., Disp: , Rfl:  .  diphenhydramine-acetaminophen (TYLENOL PM) 25-500 MG TABS, Take 1 tablet by mouth at bedtime as needed (for pain /sleep)., Disp: , Rfl:  .  glucose blood (ACCU-CHEK AVIVA) test strip, Test once per day, Disp: 100 each, Rfl: 3 .  HYDROcodone-acetaminophen (NORCO) 7.5-325 MG per tablet, Take 1-2 tablets by mouth every 4 (four) hours as needed for moderate pain., Disp: 90 tablet, Rfl: 0 .  Lancets (ACCU-CHEK SOFT TOUCH) lancets, Use as instructed to check blood sugar, Disp: 100 each, Rfl: 3 .  lisinopril-hydrochlorothiazide (PRINZIDE,ZESTORETIC) 20-25 MG tablet, Take 1 tablet by mouth daily., Disp: 30 tablet, Rfl: 1 .  metFORMIN (GLUCOPHAGE) 1000 MG tablet, TAKE ONE TABLET BY MOUTH TWICE DAILY, Disp: 180 tablet, Rfl: 0 .  oxyCODONE-acetaminophen (PERCOCET/ROXICET) 5-325 MG per tablet, Take by mouth every 6 (six) hours as needed., Disp: , Rfl: 0 .  pramipexole (MIRAPEX) 1 MG tablet, TAKE ONE TABLET BY MOUTH TWICE DAILY,  Disp: 180 tablet, Rfl: 0 .  sitaGLIPtin (JANUVIA) 100 MG tablet, Take 1 tablet (100 mg total) by mouth daily., Disp: 90 tablet, Rfl: 1  EXAM:  Filed Vitals:   05/31/15 1012  BP: 112/68  Pulse: 89  Temp: 97.6 F (36.4 C)    Body mass index is 37.28 kg/(m^2).  GENERAL: vitals reviewed and listed above, alert, oriented, appears well hydrated and in no acute distress  HEENT: atraumatic, conjunttiva clear, no obvious abnormalities on inspection of external nose and ears  NECK: no obvious masses on inspection  LUNGS: clear to auscultation bilaterally, no wheezes, rales or rhonchi, good air movement  CV: HRRR, tr bilat ankle edema  MS: moves all extremities without noticeable abnormality, mild swelling of R radial wrist in region of 1st CMC joint, difuse TTP in this area, normal ROM, NV intact distal  PSYCH: pleasant and cooperative, no obvious depression or anxiety  ASSESSMENT AND PLAN:  Discussed the following assessment and plan:  Essential hypertension  Bilateral edema of lower extremity  Right wrist pain - Plan: DG Wrist Complete Right, Uric Acid  Type 2 diabetes mellitus with diabetic neuropathy, without long-term current use of insulin (HCC) -swelling improved - add compression, discussed how to get these on and that application device may help -hand swelling may be gout, she reports prior acute brief painful episodes of joint swelling - may be diuretic induced given flare after increasing diuretic -on acei already -may benefit from addition of allopruinol once acute episode resolved if elevated uric acid -uric acid level today, xray to further eval, consider rheum eval if persists, short course nsaids for now -Januvia rx sent check [id number] at follow up -Patient advised to return or notify a doctor immediately if symptoms worsen or persist or new concerns arise.  Patient Instructions  BEFORE YOU LEAVE: -follow up appointment in 2 months -labs -xray sheet  Aleve  twice daily for 5 days  Go get xray of your wrist  Use compression sock daily and remove at night - can use compression sock applicator to help with applying  We recommend the following healthy lifestyle measures: - eat a healthy whole foods diet consisting of regular small meals composed of vegetables, fruits, beans, nuts, seeds, healthy meats such as white chicken and fish and whole grains.  - avoid sweets, white starchy foods, fried foods, fast food, processed foods, sodas, red meet and other fattening foods.  - get a least 150-300 minutes of aerobic exercise per week.  Colin Benton R.

## 2015-05-31 NOTE — Patient Instructions (Signed)
BEFORE YOU LEAVE: -follow up appointment in 2 months -labs -xray sheet  Aleve twice daily for 5 days  Go get xray of your wrist  Use compression sock daily and remove at night - can use compression sock applicator to help with applying  We recommend the following healthy lifestyle measures: - eat a healthy whole foods diet consisting of regular small meals composed of vegetables, fruits, beans, nuts, seeds, healthy meats such as white chicken and fish and whole grains.  - avoid sweets, white starchy foods, fried foods, fast food, processed foods, sodas, red meet and other fattening foods.  - get a least 150-300 minutes of aerobic exercise per week.

## 2015-07-02 ENCOUNTER — Emergency Department (HOSPITAL_COMMUNITY): Payer: Commercial Managed Care - HMO

## 2015-07-02 ENCOUNTER — Ambulatory Visit (INDEPENDENT_AMBULATORY_CARE_PROVIDER_SITE_OTHER): Payer: Commercial Managed Care - HMO | Admitting: Family Medicine

## 2015-07-02 ENCOUNTER — Inpatient Hospital Stay (HOSPITAL_COMMUNITY)
Admission: EM | Admit: 2015-07-02 | Discharge: 2015-07-07 | DRG: 291 | Disposition: A | Discharge status: Home Health | Payer: Commercial Managed Care - HMO | Attending: Internal Medicine | Admitting: Internal Medicine

## 2015-07-02 ENCOUNTER — Encounter: Payer: Self-pay | Admitting: Family Medicine

## 2015-07-02 ENCOUNTER — Encounter (HOSPITAL_COMMUNITY): Payer: Self-pay | Admitting: Neurology

## 2015-07-02 VITALS — BP 140/68 | HR 92 | Temp 98.6°F | Ht 61.5 in | Wt 206.0 lb

## 2015-07-02 DIAGNOSIS — I110 Hypertensive heart disease with heart failure: Principal | ICD-10-CM | POA: Diagnosis present

## 2015-07-02 DIAGNOSIS — H9190 Unspecified hearing loss, unspecified ear: Secondary | ICD-10-CM | POA: Diagnosis not present

## 2015-07-02 DIAGNOSIS — R0602 Shortness of breath: Secondary | ICD-10-CM | POA: Diagnosis not present

## 2015-07-02 DIAGNOSIS — Z8249 Family history of ischemic heart disease and other diseases of the circulatory system: Secondary | ICD-10-CM | POA: Diagnosis not present

## 2015-07-02 DIAGNOSIS — I5031 Acute diastolic (congestive) heart failure: Secondary | ICD-10-CM | POA: Diagnosis not present

## 2015-07-02 DIAGNOSIS — M79662 Pain in left lower leg: Secondary | ICD-10-CM | POA: Diagnosis not present

## 2015-07-02 DIAGNOSIS — C50911 Malignant neoplasm of unspecified site of right female breast: Secondary | ICD-10-CM | POA: Diagnosis not present

## 2015-07-02 DIAGNOSIS — R079 Chest pain, unspecified: Secondary | ICD-10-CM

## 2015-07-02 DIAGNOSIS — Z833 Family history of diabetes mellitus: Secondary | ICD-10-CM | POA: Diagnosis not present

## 2015-07-02 DIAGNOSIS — Z803 Family history of malignant neoplasm of breast: Secondary | ICD-10-CM | POA: Diagnosis not present

## 2015-07-02 DIAGNOSIS — R0600 Dyspnea, unspecified: Secondary | ICD-10-CM

## 2015-07-02 DIAGNOSIS — E1140 Type 2 diabetes mellitus with diabetic neuropathy, unspecified: Secondary | ICD-10-CM | POA: Diagnosis present

## 2015-07-02 DIAGNOSIS — G4733 Obstructive sleep apnea (adult) (pediatric): Secondary | ICD-10-CM | POA: Diagnosis not present

## 2015-07-02 DIAGNOSIS — J9601 Acute respiratory failure with hypoxia: Secondary | ICD-10-CM | POA: Diagnosis present

## 2015-07-02 DIAGNOSIS — G2581 Restless legs syndrome: Secondary | ICD-10-CM | POA: Diagnosis present

## 2015-07-02 DIAGNOSIS — Z853 Personal history of malignant neoplasm of breast: Secondary | ICD-10-CM | POA: Diagnosis not present

## 2015-07-02 DIAGNOSIS — Z79899 Other long term (current) drug therapy: Secondary | ICD-10-CM

## 2015-07-02 DIAGNOSIS — H9193 Unspecified hearing loss, bilateral: Secondary | ICD-10-CM | POA: Diagnosis not present

## 2015-07-02 DIAGNOSIS — Z79811 Long term (current) use of aromatase inhibitors: Secondary | ICD-10-CM | POA: Diagnosis not present

## 2015-07-02 DIAGNOSIS — Z923 Personal history of irradiation: Secondary | ICD-10-CM

## 2015-07-02 DIAGNOSIS — C50411 Malignant neoplasm of upper-outer quadrant of right female breast: Secondary | ICD-10-CM | POA: Diagnosis present

## 2015-07-02 DIAGNOSIS — Z7984 Long term (current) use of oral hypoglycemic drugs: Secondary | ICD-10-CM

## 2015-07-02 DIAGNOSIS — R0902 Hypoxemia: Secondary | ICD-10-CM

## 2015-07-02 DIAGNOSIS — Z96652 Presence of left artificial knee joint: Secondary | ICD-10-CM | POA: Diagnosis present

## 2015-07-02 LAB — BASIC METABOLIC PANEL
ANION GAP: 13 (ref 5–15)
BUN: 17 mg/dL (ref 6–20)
CHLORIDE: 93 mmol/L — AB (ref 101–111)
CO2: 30 mmol/L (ref 22–32)
Calcium: 9.4 mg/dL (ref 8.9–10.3)
Creatinine, Ser: 0.69 mg/dL (ref 0.44–1.00)
GFR calc Af Amer: >60 mL/min (ref >60)
GLUCOSE: 171 mg/dL — AB (ref 65–99)
POTASSIUM: 3.2 mmol/L — AB (ref 3.5–5.1)
Sodium: 136 mmol/L (ref 135–145)

## 2015-07-02 LAB — CBC WITH DIFFERENTIAL/PLATELET
BASOS ABS: 0 10*3/uL (ref 0.0–0.1)
Basophils Relative: 0 %
EOS PCT: 1 %
Eosinophils Absolute: 0.2 10*3/uL (ref 0.0–0.7)
HEMATOCRIT: 39.8 % (ref 36.0–46.0)
HEMOGLOBIN: 13.4 g/dL (ref 12.0–15.0)
LYMPHS ABS: 1.8 10*3/uL (ref 0.7–4.0)
LYMPHS PCT: 13 %
MCH: 30.4 pg (ref 26.0–34.0)
MCHC: 33.7 g/dL (ref 30.0–36.0)
MCV: 90.2 fL (ref 78.0–100.0)
Monocytes Absolute: 1.1 10*3/uL — ABNORMAL HIGH (ref 0.1–1.0)
Monocytes Relative: 8 %
NEUTROS ABS: 11.2 10*3/uL — AB (ref 1.7–7.7)
NEUTROS PCT: 78 %
PLATELETS: 296 10*3/uL (ref 150–400)
RBC: 4.41 MIL/uL (ref 3.87–5.11)
RDW: 12.8 % (ref 11.5–15.5)
WBC: 14.2 10*3/uL — AB (ref 4.0–10.5)

## 2015-07-02 LAB — D-DIMER, QUANTITATIVE (NOT AT ARMC): D DIMER QUANT: 1.92 ug{FEU}/mL — AB (ref 0.00–0.50)

## 2015-07-02 LAB — I-STAT TROPONIN, ED: Troponin i, poc: 0 ng/mL (ref 0.00–0.08)

## 2015-07-02 LAB — BRAIN NATRIURETIC PEPTIDE: B NATRIURETIC PEPTIDE 5: 20.4 pg/mL (ref 0.0–100.0)

## 2015-07-02 MED ORDER — LISINOPRIL-HYDROCHLOROTHIAZIDE 20-25 MG PO TABS: 1.0000 | ORAL_TABLET | Freq: Every day | ORAL | Status: DC

## 2015-07-02 MED ORDER — POTASSIUM CHLORIDE CRYS ER 20 MEQ PO TBCR
20.0000 meq | EXTENDED_RELEASE_TABLET | Freq: Every day | ORAL | Status: DC
  Administered 2015-07-03 (×2): 20 meq via ORAL
  Filled 2015-07-02: qty 1

## 2015-07-02 MED ORDER — ONDANSETRON HCL 4 MG/2ML IJ SOLN: 4.0000 mg | Freq: Four times a day (QID) | INTRAMUSCULAR | Status: DC | PRN

## 2015-07-02 MED ORDER — FUROSEMIDE 10 MG/ML IJ SOLN
40.0000 mg | Freq: Two times a day (BID) | INTRAMUSCULAR | Status: DC
  Administered 2015-07-03 – 2015-07-04 (×3): 40 mg via INTRAVENOUS
  Filled 2015-07-02 (×3): qty 4

## 2015-07-02 MED ORDER — POTASSIUM CHLORIDE CRYS ER 20 MEQ PO TBCR
20.0000 meq | EXTENDED_RELEASE_TABLET | Freq: Once | ORAL | Status: AC
  Administered 2015-07-02: 20 meq via ORAL
  Filled 2015-07-02: qty 1

## 2015-07-02 MED ORDER — SODIUM CHLORIDE 0.9% FLUSH
3.0000 mL | Freq: Two times a day (BID) | INTRAVENOUS | Status: DC
  Administered 2015-07-03 – 2015-07-06 (×9): 3 mL via INTRAVENOUS

## 2015-07-02 MED ORDER — METFORMIN HCL 500 MG PO TABS
1000.0000 mg | ORAL_TABLET | Freq: Two times a day (BID) | ORAL | Status: DC
  Administered 2015-07-02: 1000 mg via ORAL
  Filled 2015-07-02: qty 2

## 2015-07-02 MED ORDER — HYDROCODONE-ACETAMINOPHEN 7.5-325 MG PO TABS: 1.0000 | ORAL_TABLET | ORAL | Status: DC | PRN

## 2015-07-02 MED ORDER — LINAGLIPTIN 5 MG PO TABS
5.0000 mg | ORAL_TABLET | Freq: Every day | ORAL | Status: DC
  Administered 2015-07-03 – 2015-07-07 (×4): 5 mg via ORAL
  Filled 2015-07-02 (×5): qty 1

## 2015-07-02 MED ORDER — ANASTROZOLE 1 MG PO TABS
1.0000 mg | ORAL_TABLET | Freq: Every day | ORAL | Status: DC
  Administered 2015-07-03 – 2015-07-07 (×5): 1 mg via ORAL
  Filled 2015-07-02 (×5): qty 1

## 2015-07-02 MED ORDER — ENOXAPARIN SODIUM 40 MG/0.4ML ~~LOC~~ SOLN
40.0000 mg | Freq: Every day | SUBCUTANEOUS | Status: DC
  Administered 2015-07-03 – 2015-07-07 (×5): 40 mg via SUBCUTANEOUS
  Filled 2015-07-02 (×5): qty 0.4

## 2015-07-02 MED ORDER — LISINOPRIL 20 MG PO TABS
20.0000 mg | ORAL_TABLET | Freq: Every day | ORAL | Status: DC
  Administered 2015-07-02 – 2015-07-04 (×3): 20 mg via ORAL
  Filled 2015-07-02 (×3): qty 1

## 2015-07-02 MED ORDER — ACETAMINOPHEN 325 MG PO TABS: 650.0000 mg | ORAL_TABLET | Freq: Four times a day (QID) | ORAL | Status: DC | PRN

## 2015-07-02 MED ORDER — SODIUM CHLORIDE 0.9 % IV SOLN: INTRAVENOUS | Status: DC

## 2015-07-02 MED ORDER — AMLODIPINE BESYLATE 10 MG PO TABS
10.0000 mg | ORAL_TABLET | Freq: Every day | ORAL | Status: DC
  Administered 2015-07-03 – 2015-07-07 (×5): 10 mg via ORAL
  Filled 2015-07-02 (×5): qty 1

## 2015-07-02 MED ORDER — ACETAMINOPHEN 650 MG RE SUPP: 650.0000 mg | Freq: Four times a day (QID) | RECTAL | Status: DC | PRN

## 2015-07-02 MED ORDER — FUROSEMIDE 10 MG/ML IJ SOLN
40.0000 mg | Freq: Once | INTRAMUSCULAR | Status: AC
  Administered 2015-07-02: 40 mg via INTRAVENOUS
  Filled 2015-07-02: qty 4

## 2015-07-02 MED ORDER — HYDROCHLOROTHIAZIDE 25 MG PO TABS
25.0000 mg | ORAL_TABLET | Freq: Every day | ORAL | Status: DC
  Administered 2015-07-02 – 2015-07-04 (×3): 25 mg via ORAL
  Filled 2015-07-02 (×3): qty 1

## 2015-07-02 MED ORDER — INSULIN ASPART 100 UNIT/ML ~~LOC~~ SOLN
0.0000 [IU] | Freq: Three times a day (TID) | SUBCUTANEOUS | Status: DC
  Administered 2015-07-03 – 2015-07-04 (×6): 3 [IU] via SUBCUTANEOUS
  Administered 2015-07-05: 5 [IU] via SUBCUTANEOUS
  Administered 2015-07-05: 2 [IU] via SUBCUTANEOUS
  Administered 2015-07-05: 3 [IU] via SUBCUTANEOUS
  Administered 2015-07-06: 5 [IU] via SUBCUTANEOUS
  Administered 2015-07-06 (×2): 3 [IU] via SUBCUTANEOUS
  Administered 2015-07-07 (×2): 5 [IU] via SUBCUTANEOUS

## 2015-07-02 MED ORDER — METHOCARBAMOL 500 MG PO TABS: 500.0000 mg | ORAL_TABLET | Freq: Four times a day (QID) | ORAL | Status: DC | PRN

## 2015-07-02 MED ORDER — PRAMIPEXOLE DIHYDROCHLORIDE 1 MG PO TABS
1.0000 mg | ORAL_TABLET | Freq: Two times a day (BID) | ORAL | Status: DC
  Administered 2015-07-02 – 2015-07-07 (×9): 1 mg via ORAL
  Filled 2015-07-02 (×11): qty 1

## 2015-07-02 MED ORDER — ASPIRIN EC 325 MG PO TBEC
325.0000 mg | DELAYED_RELEASE_TABLET | Freq: Every day | ORAL | Status: DC
  Administered 2015-07-03 – 2015-07-07 (×5): 325 mg via ORAL
  Filled 2015-07-02 (×5): qty 1

## 2015-07-02 MED ORDER — IOHEXOL 350 MG/ML SOLN
80.0000 mL | Freq: Once | INTRAVENOUS | Status: AC | PRN
  Administered 2015-07-02: 80 mL via INTRAVENOUS

## 2015-07-02 MED ORDER — ONDANSETRON HCL 4 MG PO TABS: 4.0000 mg | ORAL_TABLET | Freq: Four times a day (QID) | ORAL | Status: DC | PRN

## 2015-07-02 NOTE — ED Provider Notes (Signed)
CSN: [id number]     Arrival date & time 07/02/15  1358 History   First MD Initiated Contact with Patient 07/02/15 1744     Chief Complaint  Patient presents with  . Shortness of Breath     (Consider location/radiation/quality/duration/timing/severity/associated sxs/prior Treatment) HPI   [name] is a 75 y.o. female presents for evaluation of shortness of breath, worse when supine, for several days. She saw her PCP today for evaluation and was found to be hypoxic and was therefore sent here for evaluation. She has had intermittent leg swelling for the last several months, treated with an increased dose of her fluid medicine, by her report. She denies history of heart failure, chronic respiratory disorder or other known cardiac problems. She's been eating well. She denies fever, cough, new weakness or dizziness. Is taking her usual medications, without relief. There are no other known modifying factors.    Past Medical History  Diagnosis Date  . Hypertension   . Arthritis   . Hearing loss   . History of radiation therapy 04/29/10-05/05/10    right breast 34Gy/19fx  . Actinic keratosis 07/18/2008    Qualifier: Diagnosis of  By: Arnoldo Morale MD, Balinda Quails   . Closed fracture of one or more phalanges of foot 03/29/2008    Qualifier: Diagnosis of  By: Arnoldo Morale MD, Balinda Quails   . No blood products ([employer])  09/11/2013  . RESTLESS LEGS SYNDROME 05/05/2007    Qualifier: Diagnosis of  By: Gwenette Greet MD, Armando Reichert   . Radiation 05/05/2010    Right Breast mammosite  . Sleep apnea   . History of environmental allergies   . Diabetes mellitus     type 2  . Anxiety     occasional  . History of kidney stones 20 years ago  . Falls frequently     "can not pick left leg up"  . Cancer [facility]) dx'd 2012    right breast --xrt   Past Surgical History  Procedure Laterality Date  . Flex signoidoscopy    . Incision and drainage perirectal abscess    . Tubal ligation  1979  . Breast surgery  2012    rt  breast lumpectomy  . Appendectomy  age 13  . Joint replacement Right 2006    knee  . 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 Cancer Sister     breast  . Heart disease Paternal Grandfather   . Diabetes Other   . Obesity Other   . Heart disease Brother     Valve   Social History  Substance Use Topics  . Smoking status: Never Smoker   . Smokeless tobacco: Never Used  . Alcohol Use: No   OB History    No data available     Review of Systems  All other systems reviewed and are negative.     Allergies  Sulfonamide derivatives and Other  Home Medications   Prior to Admission medications   Medication Sig Start Date End Date Taking? Authorizing Provider  amLODipine (NORVASC) 10 MG tablet TAKE ONE TABLET BY MOUTH ONCE DAILY 05/27/15  Yes [name], DO  anastrozole (ARIMIDEX) 1 MG tablet TAKE ONE TABLET BY MOUTH ONCE DAILY 05/27/15  Yes Nicholas Lose, MD  diphenhydramine-acetaminophen (TYLENOL PM) 25-500 MG TABS Take 1 tablet  by mouth at bedtime as needed (for pain /sleep).   Yes Historical Provider, MD  HYDROcodone-acetaminophen (NORCO) 7.5-325 MG per tablet Take 1-2 tablets by mouth every 4 (four) hours as needed for moderate pain. 08/08/14  Yes Arlee Muslim, PA-C  lisinopril-hydrochlorothiazide (PRINZIDE,ZESTORETIC) 20-25 MG tablet Take 1 tablet by mouth daily. Patient taking differently: Take 1 tablet by mouth at bedtime.  05/17/15  Yes [name], DO  metFORMIN (GLUCOPHAGE) 1000 MG tablet TAKE ONE TABLET BY MOUTH TWICE DAILY 04/02/15  Yes [name], DO  methocarbamol (ROBAXIN) 500 MG tablet Take 500 mg by mouth every 6 (six) hours as needed for muscle spasms.   Yes Historical Provider, MD  pramipexole (MIRAPEX) 1 MG tablet TAKE ONE TABLET BY MOUTH TWICE DAILY Patient taking differently: Take  1 mg by mouth daily at bedtime. 04/02/15  Yes [name], DO  sitaGLIPtin (JANUVIA) 100 MG tablet Take 1 tablet (100 mg total) by mouth daily. 05/31/15  Yes Hannah R Kim, DO   BP 165/90 mmHg  Pulse 107  Temp(Src) 97.9 F (36.6 C) (Oral)  Resp 25  SpO2 95% Physical Exam  Constitutional: She is oriented to person, place, and time. She appears well-developed.  Elderly, frail  HENT:  Head: Normocephalic and atraumatic.  Right Ear: External ear normal.  Left Ear: External ear normal.  Eyes: Conjunctivae and EOM are normal. Pupils are equal, round, and reactive to light.  Neck: Normal range of motion and phonation normal. Neck supple.  Cardiovascular: Normal rate, regular rhythm and normal heart sounds.   No JVD  Pulmonary/Chest: Effort normal and breath sounds normal. No respiratory distress. She has no wheezes. She has no rales. She exhibits no tenderness and no bony tenderness.  Abdominal: Soft. There is no tenderness.  Musculoskeletal: Normal range of motion. She exhibits no tenderness.  3+ pitting edema, legs, bilaterally.  Neurological: She is alert and oriented to person, place, and time. No cranial nerve deficit or sensory deficit. She exhibits normal muscle tone. Coordination normal.  Skin: Skin is warm, dry and intact.  Psychiatric: She has a normal mood and affect. Her behavior is normal. Judgment and thought content normal.  Nursing note and vitals reviewed.   ED Course  Procedures (including critical care time)  Medications  0.9 %  sodium chloride infusion (not administered)  furosemide (LASIX) injection 40 mg (40 mg Intravenous Given 07/02/15 1849)  iohexol (OMNIPAQUE) 350 MG/ML injection 80 mL (80 mLs Intravenous Contrast Given 07/02/15 2114)    Patient Vitals for the past 24 hrs:  BP Temp Temp src Pulse Resp SpO2  07/02/15 2045 - - - 107 25 95 %  07/02/15 2015 - - - 106 19 94 %  07/02/15 1930 165/90 mmHg - - 95 (!) 33 96 %  07/02/15 1900 130/98 mmHg - - 94 (!) 33  97 %  07/02/15 1830 161/88 mmHg - - 91 (!) 35 96 %  07/02/15 1815 167/89 mmHg - - 92 25 97 %  07/02/15 1800 169/80 mmHg - - 91 (!) 28 95 %  07/02/15 1418 - - - - - 94 %  07/02/15 1406 169/97 mmHg 97.9 F (36.6 C) Oral 87 18 90 %    9:48 PM Reevaluation with update and discussion. After initial assessment and treatment, an updated evaluation reveals she is comfortable, on room air with oxygen saturation 86%. Patient family member updated on findings.Daleen Bo L   10:08 PM-Consult complete with Hospitalist. Patient case explained and discussed. He agrees to  admit patient for further evaluation and treatment. Call ended at [place] Reviewed  CBC WITH DIFFERENTIAL/PLATELET - Abnormal; Notable for the following:    WBC 14.2 (*)    Neutro Abs 11.2 (*)    Monocytes Absolute 1.1 (*)    All other components within normal limits  BASIC METABOLIC PANEL - Abnormal; Notable for the following:    Potassium 3.2 (*)    Chloride 93 (*)    Glucose, Bld 171 (*)    All other components within normal limits  D-DIMER, QUANTITATIVE (NOT AT [facility]) - Abnormal; Notable for the following:    D-Dimer, Quant 1.92 (*)    All other components within normal limits  BRAIN NATRIURETIC PEPTIDE  I-STAT TROPOININ, ED    Imaging Review Dg Chest 2 View  07/02/2015  CLINICAL DATA:  Shortness of breath for 1 day. Several day history of chest pain on the left EXAM: CHEST  2 VIEW COMPARISON:  Chest radiograph August 08, 2014; chest CT August 09, 2014 FINDINGS: There is stable elevation of the left hemidiaphragm with atelectatic change in the left base. There is no edema or consolidation. Heart is upper normal in size with pulmonary vascularity within normal limits. There is atherosclerotic calcification in the aorta. No demonstrable adenopathy. There is degenerative change in the thoracic spine. No pneumothorax. IMPRESSION: Elevation left hemidiaphragm with atelectatic change left base. No edema or  consolidation. Stable cardiac silhouette. Electronically Signed   By: Lowella Grip III M.D.   On: 07/02/2015 14:54   Ct Angio Chest Pe W/cm &/or Wo Cm  07/02/2015  CLINICAL DATA:  Hypoxia, shortness of breath, and back pain for several days. EXAM: CT ANGIOGRAPHY CHEST WITH CONTRAST TECHNIQUE: Multidetector CT imaging of the chest was performed using the standard protocol during bolus administration of intravenous contrast. Multiplanar CT image reconstructions and MIPs were obtained to evaluate the vascular anatomy. CONTRAST:  64mL OMNIPAQUE IOHEXOL 350 MG/ML SOLN COMPARISON:  08/09/2014 FINDINGS: Technically adequate study with good opacification of the central and segmental pulmonary arteries. No focal filling defects demonstrated. No evidence of significant pulmonary embolus. Pulmonary arteries are somewhat prominent suggesting possible arterial hypertension. Normal heart size. Normal caliber thoracic aorta with calcification. The esophagus is decompressed. No significant lymphadenopathy in the chest. Evaluation of lungs is limited due to respiratory motion artifact. There is atelectasis in the lung bases, greater on the left, with elevation of the left hemidiaphragm. No pleural effusions. No pneumothorax. Airways appear patent. There is a circumscribed nodular lesion in the right breast measuring 3.2 cm diameter. Density of the lesion is consistent with fluid and fat. This may represent a postoperative cavity or area fat necrosis. Appearance is unchanged since previous study. Included portions of the upper abdominal organs demonstrate a distended gallbladder with increased density inside the gallbladder suggesting stones or sludge. Degenerative changes throughout the spine. Review of the MIP images confirms the above findings. IMPRESSION: No evidence of significant pulmonary embolus. Elevation of left hemidiaphragm. Atelectasis in the lung bases. Incidental note of a distended gallbladder with increased  density suggesting stones or sludge. Electronically Signed   By: Lucienne Capers M.D.   On: 07/02/2015 21:39   I have personally reviewed and evaluated these images and lab results as part of my medical decision-making.   EKG Interpretation   Date/Time:  Tuesday July 02 2015 14:06:38 EST Ventricular Rate:  91 PR Interval:  168 QRS Duration: 80 QT Interval:  356 QTC Calculation: 437 R Axis:   -49  Text Interpretation:  Normal sinus rhythm Left axis deviation Moderate  voltage criteria for LVH, may be normal variant Inferior infarct , age  undetermined Anterolateral infarct , age undetermined Abnormal ECG since  last tracing no significant change Confirmed by [facility]  MD, Sela Falk [phone])  on 07/02/2015 5:52:14 PM      MDM   Final diagnoses:  Hypoxia     Shortness of breath, which is nonspecific associated with hypoxia. Evaluation today did not indicate CHF, PE, pneumonia, or anemia. Nonspecific elevation of white blood cell count. She will require treatment with oxygen, and likely further evaluation for the source of the hypoxia. The patient will be admitted to the hospital, for further care.  Nursing Notes Reviewed/ Care Coordinated Applicable Imaging Reviewed Interpretation of Laboratory Data incorporated into ED treatment   Plan: Admit    Daleen Bo, MD 07/03/15 [phone]

## 2015-07-02 NOTE — Progress Notes (Signed)
HPI:  [name] is a pleasant 75 yo with a PMH significant for HTN, uncontrolled DM, and breast Ca here for an acute visit for multiple complaints. Reports gradually worsening SOB, DOE, swelling in leg, generalized malaise and aches and constant L post chest pain for 1 week. Now feels it is difficult to take a deep breath. Denies: fevers, cough, hypoglycemia, change in baseline nasal rhinorrhea, sore throat, vomiting, nausea, chest pressure, jaw pain, palpitations or recent illness.  ROS: See pertinent positives and negatives per HPI.  Past Medical History  Diagnosis Date  . Hypertension   . Arthritis   . Hearing loss   . History of radiation therapy 04/29/10-05/05/10    right breast 34Gy/55fx  . Actinic keratosis 07/18/2008    Qualifier: Diagnosis of  By: Arnoldo Morale MD, Balinda Quails   . Closed fracture of one or more phalanges of foot 03/29/2008    Qualifier: Diagnosis of  By: Arnoldo Morale MD, Balinda Quails   . No blood products ([employer])  09/11/2013  . RESTLESS LEGS SYNDROME 05/05/2007    Qualifier: Diagnosis of  By: Gwenette Greet MD, Armando Reichert   . Radiation 05/05/2010    Right Breast mammosite  . Sleep apnea   . History of environmental allergies   . Diabetes mellitus     type 2  . Anxiety     occasional  . History of kidney stones 20 years ago  . Falls frequently     "can not pick left leg up"  . Cancer [facility]) dx'd 2012    right breast --xrt    Past Surgical History  Procedure Laterality Date  . Flex signoidoscopy    . Incision and drainage perirectal abscess    . Tubal ligation  1979  . Breast surgery  2012    rt breast lumpectomy  . Appendectomy  age 87  . Joint replacement Right 2006    knee  . 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 Cancer Sister     breast  . Heart disease Paternal Grandfather    . Diabetes Other   . Obesity Other   . Heart disease Brother     Valve    Social History   Social History  . Marital Status: Married    Spouse Name: N/A  . Number of Children: 3  . Years of Education: N/A   Social History Main Topics  . Smoking status: Never Smoker   . Smokeless tobacco: Never Used  . Alcohol Use: No  . Drug Use: No  . Sexual Activity: Yes   Other Topics Concern  . None   Social History Narrative         Home Situation: lives with husband and granddaughter      Spiritual Beliefs: Jehovah Witness - no blood products                    Current outpatient prescriptions:  .  amLODipine (NORVASC) 10 MG tablet, TAKE ONE TABLET BY MOUTH ONCE DAILY, Disp: 90 tablet, Rfl: 1 .  anastrozole (ARIMIDEX) 1 MG tablet, TAKE ONE TABLET BY MOUTH ONCE DAILY, Disp: 30 tablet, Rfl: 4 .  diazepam (VALIUM) 5 MG tablet, Take 5 mg by mouth every 6 (six) hours as needed for anxiety., Disp: , Rfl:  .  diphenhydramine-acetaminophen (TYLENOL PM) 25-500 MG TABS, Take 1 tablet by mouth at bedtime as needed (for pain /sleep)., Disp: , Rfl:  .  glucose blood (ACCU-CHEK AVIVA) test strip, Test once per day, Disp: 100 each, Rfl: 3 .  HYDROcodone-acetaminophen (NORCO) 7.5-325 MG per tablet, Take 1-2 tablets by mouth every 4 (four) hours as needed for moderate pain., Disp: 90 tablet, Rfl: 0 .  Lancets (ACCU-CHEK SOFT TOUCH) lancets, Use as instructed to check blood sugar, Disp: 100 each, Rfl: 3 .  lisinopril-hydrochlorothiazide (PRINZIDE,ZESTORETIC) 20-25 MG tablet, Take 1 tablet by mouth daily., Disp: 30 tablet, Rfl: 1 .  metFORMIN (GLUCOPHAGE) 1000 MG tablet, TAKE ONE TABLET BY MOUTH TWICE DAILY, Disp: 180 tablet, Rfl: 0 .  oxyCODONE-acetaminophen (PERCOCET/ROXICET) 5-325 MG per tablet, Take by mouth every 6 (six) hours as needed., Disp: , Rfl: 0 .  pramipexole (MIRAPEX) 1 MG tablet, TAKE ONE TABLET BY MOUTH TWICE DAILY, Disp: 180 tablet, Rfl: 0 .  sitaGLIPtin (JANUVIA) 100 MG  tablet, Take 1 tablet (100 mg total) by mouth daily., Disp: 90 tablet, Rfl: 1  EXAM:  Filed Vitals:   07/02/15 1313  BP: 140/68  Pulse: 92  Temp: 98.6 F (37 C)    Body mass index is 38.3 kg/(m^2).  GENERAL: vitals reviewed and listed above, alert, oriented, she appears mildly tachypneic  HEENT: atraumatic, conjunttiva clear, no obvious abnormalities on inspection of external nose and ears  NECK: no obvious masses on inspection  LUNGS: scattered rales, tachypnea with RR 25-30  CV: HRRR, bilat LE edema to knees  MS: moves all extremities without noticeable abnormality  PSYCH: pleasant and cooperative, no obvious depression or anxiety  ASSESSMENT AND PLAN:  Discussed the following assessment and plan:  Dyspnea  Acute respiratory failure with hypoxia (HCC)  Chest pain, unspecified chest pain type  -multiple concerning symptoms with mild hypoxic resp distress on exam -discussed briefly possible infectious, cardiac, resp and metabolic causes of of these symptoms and findings and agreed needs urgent evaluation in a hospital setting - advised EMS transport but they opted to drive and refused EMS transport. Staff to notify emergency department personel.  -Patient advised to return or notify a doctor immediately if symptoms worsen or persist or new concerns arise.  There are no Patient Instructions on file for this visit.   Colin Benton R.

## 2015-07-02 NOTE — ED Notes (Signed)
Admitting at bedside 

## 2015-07-02 NOTE — H&P (Signed)
[facility] History and Physical  [name] [id number] DOB: [birth year] DOA: 07/02/2015  Referring physician: Dr. Eulis Foster. PCP: [name]., DO  Specialists: Dr. Lindi Adie. Oncologist.  Chief Complaint: Shortness of breath.  HPI: [name] is a 75 y.o. female with history of breast cancer in remission, diabetes mellitus type 2, hypertension was referred to the ER after patient was having increasing shortness of breath on exertion with lower extremity edema. Patient has noticed increasing shortness of breath over the last 1 week which is present on exertion also on lying flat. Patient also has noticed increasing swelling of the lower extremities. CT angiogram of the chest in the ER was unremarkable. Patient is having significant lower extremity edema and will be admitted for further management of shortness of breath most likely from fluid overload. Denies any chest pain fever chills productive cough nausea vomiting. CT scan did show abnormality in the gallbladder but patient denies any abdominal pain.   Review of Systems: As presented in the history of presenting illness, rest negative.  Past Medical History  Diagnosis Date  . Hypertension   . Arthritis   . Hearing loss   . History of radiation therapy 04/29/10-05/05/10    right breast 34Gy/77fx  . Actinic keratosis 07/18/2008    Qualifier: Diagnosis of  By: Arnoldo Morale MD, Balinda Quails   . Closed fracture of one or more phalanges of foot 03/29/2008    Qualifier: Diagnosis of  By: Arnoldo Morale MD, Balinda Quails   . No blood products ([employer])  09/11/2013  . RESTLESS LEGS SYNDROME 05/05/2007    Qualifier: Diagnosis of  By: Gwenette Greet MD, Armando Reichert   . Radiation 05/05/2010    Right Breast mammosite  . Sleep apnea   . History of environmental allergies   . Diabetes mellitus     type 2  . Anxiety     occasional  . History of kidney stones 20 years ago  . Falls frequently     "can not pick left leg up"  . Cancer [facility]) dx'd 2012    right  breast --xrt   Past Surgical History  Procedure Laterality Date  . Flex signoidoscopy    . Incision and drainage perirectal abscess    . Tubal ligation  1979  . Breast surgery  2012    rt breast lumpectomy  . Appendectomy  age 14  . Joint replacement Right 2006    knee  . Total knee arthroplasty Left 08/06/2014    Procedure: LEFT TOTAL KNEE ARTHROPLASTY;  Surgeon: Gaynelle Arabian, MD;  Location: [facility] ORS;  Service: Orthopedics;  Laterality: Left;   Social History:  reports that she has never smoked. She has never used smokeless tobacco. She reports that she does not drink alcohol or use illicit drugs. Where does patient live home. Can patient participate in ADLs? Yes.  Allergies  Allergen Reactions  . Sulfonamide Derivatives Swelling    Lips Swelling  . Other     BLOOD PRODUCT REFUSAL    Family History:  Family History  Problem Relation Age of Onset  . Cancer Sister     breast  . Heart disease Paternal Grandfather   . Diabetes Other   . Obesity Other   . Heart disease Brother     Valve      Prior to Admission medications   Medication Sig Start Date End Date Taking? Authorizing Provider  amLODipine (NORVASC) 10 MG tablet TAKE ONE TABLET BY MOUTH ONCE DAILY 05/27/15  Yes Nickola Major  Kim, DO  anastrozole (ARIMIDEX) 1 MG tablet TAKE ONE TABLET BY MOUTH ONCE DAILY 05/27/15  Yes Nicholas Lose, MD  diphenhydramine-acetaminophen (TYLENOL PM) 25-500 MG TABS Take 1 tablet by mouth at bedtime as needed (for pain /sleep).   Yes Historical Provider, MD  HYDROcodone-acetaminophen (NORCO) 7.5-325 MG per tablet Take 1-2 tablets by mouth every 4 (four) hours as needed for moderate pain. 08/08/14  Yes Arlee Muslim, PA-C  lisinopril-hydrochlorothiazide (PRINZIDE,ZESTORETIC) 20-25 MG tablet Take 1 tablet by mouth daily. Patient taking differently: Take 1 tablet by mouth at bedtime.  05/17/15  Yes [name], DO  metFORMIN (GLUCOPHAGE) 1000 MG tablet TAKE ONE TABLET BY MOUTH TWICE DAILY 04/02/15  Yes  [name], DO  methocarbamol (ROBAXIN) 500 MG tablet Take 500 mg by mouth every 6 (six) hours as needed for muscle spasms.   Yes Historical Provider, MD  pramipexole (MIRAPEX) 1 MG tablet TAKE ONE TABLET BY MOUTH TWICE DAILY Patient taking differently: Take 1 mg by mouth daily at bedtime. 04/02/15  Yes [name], DO  sitaGLIPtin (JANUVIA) 100 MG tablet Take 1 tablet (100 mg total) by mouth daily. 05/31/15  Yes [name], DO    Physical Exam: Filed Vitals:   07/02/15 2045 07/02/15 2145 07/02/15 2300 07/02/15 2341  BP:  166/80  131/83  Pulse: 107  106 104  Temp:    98.7 F (37.1 C)  TempSrc:    Oral  Resp: 25   20  Height:    5\' 1"  (1.549 m)  Weight:    91.264 kg (201 lb 3.2 oz)  SpO2: 95%  96% 94%     General:  Moderately built and nourished.  Eyes: Anicteric no pallor.  ENT: No discharge from the ears eyes nose and mouth.  Neck: No JVD appreciated. No mass felt.  Cardiovascular: S1 and S2 heard.  Respiratory: No rhonchi or crepitations.  Abdomen: Soft nontender bowel sounds present.  Skin: No rash.  Musculoskeletal: Bilateral lower extremity edema.  Psychiatric: Appears normal.  Neurologic: Alert awake oriented to time place and person. Moves all extremities.  Labs on Admission:  Basic Metabolic Panel:  Recent Labs Lab 07/02/15 1417  NA 136  K 3.2*  CL 93*  CO2 30  GLUCOSE 171*  BUN 17  CREATININE 0.69  CALCIUM 9.4   Liver Function Tests: No results for input(s): AST, ALT, ALKPHOS, BILITOT, PROT, ALBUMIN in the last 168 hours. No results for input(s): LIPASE, AMYLASE in the last 168 hours. No results for input(s): AMMONIA in the last 168 hours. CBC:  Recent Labs Lab 07/02/15 1417  WBC 14.2*  NEUTROABS 11.2*  HGB 13.4  HCT 39.8  MCV 90.2  PLT 296   Cardiac Enzymes: No results for input(s): CKTOTAL, CKMB, CKMBINDEX, TROPONINI in the last 168 hours.  BNP (last 3 results)  Recent Labs  07/02/15 1827  BNP 20.4    ProBNP (last 3  results) No results for input(s): PROBNP in the last 8760 hours.  CBG: No results for input(s): GLUCAP in the last 168 hours.  Radiological Exams on Admission: Dg Chest 2 View  07/02/2015  CLINICAL DATA:  Shortness of breath for 1 day. Several day history of chest pain on the left EXAM: CHEST  2 VIEW COMPARISON:  Chest radiograph August 08, 2014; chest CT August 09, 2014 FINDINGS: There is stable elevation of the left hemidiaphragm with atelectatic change in the left base. There is no edema or consolidation. Heart is upper normal in size with  pulmonary vascularity within normal limits. There is atherosclerotic calcification in the aorta. No demonstrable adenopathy. There is degenerative change in the thoracic spine. No pneumothorax. IMPRESSION: Elevation left hemidiaphragm with atelectatic change left base. No edema or consolidation. Stable cardiac silhouette. Electronically Signed   By: Lowella Grip III M.D.   On: 07/02/2015 14:54   Ct Angio Chest Pe W/cm &/or Wo Cm  07/02/2015  CLINICAL DATA:  Hypoxia, shortness of breath, and back pain for several days. EXAM: CT ANGIOGRAPHY CHEST WITH CONTRAST TECHNIQUE: Multidetector CT imaging of the chest was performed using the standard protocol during bolus administration of intravenous contrast. Multiplanar CT image reconstructions and MIPs were obtained to evaluate the vascular anatomy. CONTRAST:  17mL OMNIPAQUE IOHEXOL 350 MG/ML SOLN COMPARISON:  08/09/2014 FINDINGS: Technically adequate study with good opacification of the central and segmental pulmonary arteries. No focal filling defects demonstrated. No evidence of significant pulmonary embolus. Pulmonary arteries are somewhat prominent suggesting possible arterial hypertension. Normal heart size. Normal caliber thoracic aorta with calcification. The esophagus is decompressed. No significant lymphadenopathy in the chest. Evaluation of lungs is limited due to respiratory motion artifact. There is  atelectasis in the lung bases, greater on the left, with elevation of the left hemidiaphragm. No pleural effusions. No pneumothorax. Airways appear patent. There is a circumscribed nodular lesion in the right breast measuring 3.2 cm diameter. Density of the lesion is consistent with fluid and fat. This may represent a postoperative cavity or area fat necrosis. Appearance is unchanged since previous study. Included portions of the upper abdominal organs demonstrate a distended gallbladder with increased density inside the gallbladder suggesting stones or sludge. Degenerative changes throughout the spine. Review of the MIP images confirms the above findings. IMPRESSION: No evidence of significant pulmonary embolus. Elevation of left hemidiaphragm. Atelectasis in the lung bases. Incidental note of a distended gallbladder with increased density suggesting stones or sludge. Electronically Signed   By: Lucienne Capers M.D.   On: 07/02/2015 21:39    EKG: Independently reviewed. Normal sinus rhythm with LVH.  Assessment/Plan Principal Problem:   Acute respiratory failure with hypoxia (HCC) Active Problems:   Breast cancer, right breast (HCC)   Obstructive sleep apnea   RESTLESS LEGS SYNDROME   Hearing loss - has hearing aides, follow by audiologist   Type 2 diabetes mellitus with diabetic neuropathy ([place])   Acute respiratory failure ([place])   1. Acute respiratory failure with hypoxia - suspect most likely secondary to fluid overload. At this time I have placed patient on Lasix 40 mg IV every 12 hourly. Check 2-D echo check troponins and closely follow intake output and metabolic panel. Continue lisinopril and hydrochlorothiazide. 2. Obstructive sleep apnea - patient appears to be noncompliant with CPAP but agrees to use in the hospital. 3. Diabetes mellitus type 2 - continue Januvia but will hold metformin while inpatient. 4. Hypertension - continue lisinopril and hydrochlorothiazide. 5. History of  breast cancer in remission. 6. Restless leg syndrome on pramipexole.   DVT Prophylaxis Lovenox.  Code Status: Full code.  Family Communication: Patient's husband.  Disposition Plan: Admit to inpatient.    Keionte Swicegood N. [facility] Pager [phone].  If 7PM-7AM, please contact night-coverage [email or web address] Password [facility] 07/02/2015, 11:47 PM

## 2015-07-02 NOTE — Progress Notes (Signed)
Pre visit review using our clinic review tool, if applicable. No additional management support is needed unless otherwise documented below in the visit note. 

## 2015-07-02 NOTE — ED Notes (Addendum)
Pt escorted to restroom without O2 in a wheelchair. Pt pivoted to toilet then back to wheelchair. Upon returning back to room pt was at 83% on RA. Dr. Eulis Foster informed. Pt placed back on 3 L [place] and O2 sat increased to greater than 90%.

## 2015-07-02 NOTE — ED Notes (Signed)
Pt here c/o sob for several days, and upper back pain. Went to doctor and sent here for low oxygen. Pt is a x 4. Is 90% on RA.

## 2015-07-03 ENCOUNTER — Inpatient Hospital Stay (HOSPITAL_COMMUNITY): Payer: Commercial Managed Care - HMO

## 2015-07-03 DIAGNOSIS — R0600 Dyspnea, unspecified: Secondary | ICD-10-CM

## 2015-07-03 DIAGNOSIS — C50911 Malignant neoplasm of unspecified site of right female breast: Secondary | ICD-10-CM

## 2015-07-03 DIAGNOSIS — G2581 Restless legs syndrome: Secondary | ICD-10-CM

## 2015-07-03 LAB — CBC
HCT: 41 % (ref 36.0–46.0)
Hemoglobin: 14 g/dL (ref 12.0–15.0)
MCH: 31 pg (ref 26.0–34.0)
MCHC: 34.1 g/dL (ref 30.0–36.0)
MCV: 90.7 fL (ref 78.0–100.0)
PLATELETS: 321 10*3/uL (ref 150–400)
RBC: 4.52 MIL/uL (ref 3.87–5.11)
RDW: 13.1 % (ref 11.5–15.5)
WBC: 16.5 10*3/uL — AB (ref 4.0–10.5)

## 2015-07-03 LAB — GLUCOSE, CAPILLARY
GLUCOSE-CAPILLARY: 232 mg/dL — AB (ref 65–99)
GLUCOSE-CAPILLARY: 222 mg/dL — AB (ref 65–99)
GLUCOSE-CAPILLARY: 201 mg/dL — AB (ref 65–99)
Glucose-Capillary: 199 mg/dL — ABNORMAL HIGH (ref 65–99)
Glucose-Capillary: 240 mg/dL — ABNORMAL HIGH (ref 65–99)

## 2015-07-03 LAB — CBC WITH DIFFERENTIAL/PLATELET
Basophils Absolute: 0 10*3/uL (ref 0.0–0.1)
Basophils Relative: 0 %
EOS ABS: 0.1 10*3/uL (ref 0.0–0.7)
EOS PCT: 1 %
HCT: 41.7 % (ref 36.0–46.0)
Hemoglobin: 13.9 g/dL (ref 12.0–15.0)
LYMPHS ABS: 1.3 10*3/uL (ref 0.7–4.0)
Lymphocytes Relative: 9 %
MCH: 29.9 pg (ref 26.0–34.0)
MCHC: 33.3 g/dL (ref 30.0–36.0)
MCV: 89.7 fL (ref 78.0–100.0)
MONO ABS: 1.3 10*3/uL — AB (ref 0.1–1.0)
MONOS PCT: 9 %
Neutro Abs: 11.2 10*3/uL — ABNORMAL HIGH (ref 1.7–7.7)
Neutrophils Relative %: 81 %
PLATELETS: 252 10*3/uL (ref 150–400)
RBC: 4.65 MIL/uL (ref 3.87–5.11)
RDW: 13.1 % (ref 11.5–15.5)
WBC: 13.9 10*3/uL — AB (ref 4.0–10.5)

## 2015-07-03 LAB — CREATININE, SERUM: Creatinine, Ser: 0.6 mg/dL (ref 0.44–1.00)

## 2015-07-03 LAB — HEPATIC FUNCTION PANEL
ALT: 16 U/L (ref 14–54)
AST: 18 U/L (ref 15–41)
Albumin: 3.5 g/dL (ref 3.5–5.0)
Alkaline Phosphatase: 84 U/L (ref 38–126)
BILIRUBIN DIRECT: 0.1 mg/dL (ref 0.1–0.5)
BILIRUBIN TOTAL: 0.8 mg/dL (ref 0.3–1.2)
Indirect Bilirubin: 0.7 mg/dL (ref 0.3–0.9)
Total Protein: 7.9 g/dL (ref 6.5–8.1)

## 2015-07-03 LAB — MAGNESIUM: Magnesium: 1.6 mg/dL — ABNORMAL LOW (ref 1.7–2.4)

## 2015-07-03 LAB — BASIC METABOLIC PANEL
Anion gap: 12 (ref 5–15)
BUN: 13 mg/dL (ref 6–20)
CHLORIDE: 92 mmol/L — AB (ref 101–111)
CO2: 30 mmol/L (ref 22–32)
CREATININE: 0.53 mg/dL (ref 0.44–1.00)
Calcium: 8.6 mg/dL — ABNORMAL LOW (ref 8.9–10.3)
GFR calc Af Amer: >60 mL/min (ref >60)
GFR calc non Af Amer: >60 mL/min (ref >60)
Glucose, Bld: 244 mg/dL — ABNORMAL HIGH (ref 65–99)
Potassium: 3.3 mmol/L — ABNORMAL LOW (ref 3.5–5.1)
SODIUM: 134 mmol/L — AB (ref 135–145)

## 2015-07-03 LAB — URINE MICROSCOPIC-ADD ON

## 2015-07-03 LAB — URINALYSIS, ROUTINE W REFLEX MICROSCOPIC
BILIRUBIN URINE: NEGATIVE
GLUCOSE, UA: 100 mg/dL — AB
Ketones, ur: NEGATIVE mg/dL
Leukocytes, UA: NEGATIVE
Nitrite: NEGATIVE
PH: 5 (ref 5.0–8.0)
Protein, ur: 30 mg/dL — AB
SPECIFIC GRAVITY, URINE: 1.045 — AB (ref 1.005–1.030)

## 2015-07-03 LAB — TROPONIN I
Troponin I: <0.03 ng/mL (ref <0.031)
Troponin I: <0.03 ng/mL (ref <0.031)

## 2015-07-03 MED ORDER — MAGNESIUM SULFATE 4 GM/100ML IV SOLN
4.0000 g | Freq: Once | INTRAVENOUS | Status: DC
  Filled 2015-07-03: qty 100

## 2015-07-03 MED ORDER — MAGNESIUM SULFATE IN D5W 10-5 MG/ML-% IV SOLN
1.0000 g | Freq: Once | INTRAVENOUS | Status: AC
  Administered 2015-07-03: 1 g via INTRAVENOUS
  Filled 2015-07-03: qty 100

## 2015-07-03 MED ORDER — CETYLPYRIDINIUM CHLORIDE 0.05 % MT LIQD
7.0000 mL | Freq: Two times a day (BID) | OROMUCOSAL | Status: DC
  Administered 2015-07-03 – 2015-07-07 (×7): 7 mL via OROMUCOSAL

## 2015-07-03 MED ORDER — MAGNESIUM SULFATE 2 GM/50ML IV SOLN
2.0000 g | Freq: Once | INTRAVENOUS | Status: AC
  Administered 2015-07-03: 2 g via INTRAVENOUS
  Filled 2015-07-03: qty 50

## 2015-07-03 MED ORDER — POTASSIUM CHLORIDE CRYS ER 20 MEQ PO TBCR
40.0000 meq | EXTENDED_RELEASE_TABLET | Freq: Four times a day (QID) | ORAL | Status: AC
  Administered 2015-07-03 (×2): 40 meq via ORAL
  Filled 2015-07-03 (×2): qty 2

## 2015-07-03 NOTE — Progress Notes (Signed)
  Echocardiogram 2D Echocardiogram has been performed.  Diamond Nickel 07/03/2015, 1:29 PM

## 2015-07-03 NOTE — Progress Notes (Signed)
Per RT, pr unable to wear CPAP tonight and per husband request, RT let patient sleep. Pt resting comfortably at this time on 2Lnc. Will continue to monitor. Ronnette Hila, RN

## 2015-07-03 NOTE — Progress Notes (Signed)
RT NOTE:  RT called to setup CPAP for patient. Upon arrival I questioned patient of home settings. Pt did not know this, she does not wear CPAP @ home. I started patient on CPAP of 8cm H20, however, she did not tolerate this. Pressure was decreased to 5cm H20. Pt did not tolerate, she said it was to much air blowing in her nose. I explained that was the lowest I could go. Pt's husband then started " I don't know why you all cant leave her alone, we have been here all day and she is tired and needs to sleep." I did my best to explain the therapy to the husband. He continued to be negative toward me, the patient began shaking her head side to side and stated she was unable to do it to night. CPAP removed and pt placed on 2L [place]. RN aware. MD request patient do this tonight. RN will make MD aware of situation. CPAP in room in case patient changes her mind. RT will follow.

## 2015-07-03 NOTE — Progress Notes (Signed)
Pt arrived to floor, A/Ox4, 1 assist to get up. VSS no complaints of pain, will continue to monitor. Ronnette Hila, RN

## 2015-07-03 NOTE — Progress Notes (Signed)
Magnesium 1.6 on labs overnight, pt potassium 3.3 this AM. Patient receiving IVP lasix Q12. L [place] paged. Will continue to monitor. Ronnette Hila, RN

## 2015-07-03 NOTE — Progress Notes (Signed)
[facility] PROGRESS NOTE   [name] [id number] DOB: [birth year] DOA: 07/02/2015 PCP: [name]., DO  HPI/Subjective: Feels okay, denies any complaint much better than yesterday after the initiation of diuresis.  Assessment/Plan: Principal Problem:   Acute respiratory failure with hypoxia (HCC) Active Problems:   Breast cancer, right breast (HCC)   Obstructive sleep apnea   RESTLESS LEGS SYNDROME   Hearing loss - has hearing aides, follow by audiologist   Type 2 diabetes mellitus with diabetic neuropathy ([place])   Acute respiratory failure ([place])    Acute respiratory failure Patient came into the hospital with shortness of breath and oxygen saturation in the 80s. Patient required supplemental oxygen to keep oxygen saturation above 90%. This is likely secondary to acute congestive heart failure.  Acute congestive heart failure Presented with DOE, orthopnea and lower extremity edema. 2-D echo pending, the BNP is not elevated. Treated with Lasix, follow patient clinically. Restrict salt and fluid intake.  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 Hemoglobin A1c was 8.1 on 04/18/2015  Restless leg syndrome Continue home medications.  History of right-sided breast cancer Right-sided breast cancer with lesion seen again in the CT scan but no change since last study.   Code Status: Full Code Family Communication: Plan discussed with the patient. Disposition Plan: Remains inpatient Diet: Diet heart healthy/carb modified Room service appropriate?: Yes; Fluid consistency:: Thin; Fluid restriction:: 1200 mL Fluid  Consultants:  None   Procedures:  None   Antibiotics:  None  (indicate start date, and stop date if known)   Objective: Filed Vitals:   07/03/15 0528 07/03/15 1200  BP: 135/67 127/75  Pulse:  76  Temp:  97.3 F (36.3 C)  Resp:  18    Intake/Output Summary (Last  24 hours) at 07/03/15 1500 Last data filed at 07/03/15 1130  Gross per 24 hour  Intake    483 ml  Output    675 ml  Net   -192 ml   Filed Weights   07/02/15 2341 07/03/15 0315  Weight: 91.264 kg (201 lb 3.2 oz) 91.264 kg (201 lb 3.2 oz)    Exam: General: Alert and awake, oriented x3, not in any acute distress. HEENT: anicteric sclera, pupils reactive to light and accommodation, EOMI CVS: S1-S2 clear, no murmur rubs or gallops Chest: clear to auscultation bilaterally, no wheezing, rales or rhonchi Abdomen: soft nontender, nondistended, normal bowel sounds, no organomegaly Extremities: no cyanosis, clubbing or edema noted bilaterally Neuro: Cranial nerves II-XII intact, no focal neurological deficits  Data Reviewed: Basic Metabolic Panel:  Recent Labs Lab 07/02/15 1417 07/03/15 0010 07/03/15 0455  NA 136  --  134*  K 3.2*  --  3.3*  CL 93*  --  92*  CO2 30  --  30  GLUCOSE 171*  --  244*  BUN 17  --  13  CREATININE 0.69 0.60 0.53  CALCIUM 9.4  --  8.6*  MG  --  1.6*  --    Liver Function Tests:  Recent Labs Lab 07/03/15 0010  AST 18  ALT 16  ALKPHOS 84  BILITOT 0.8  PROT 7.9  ALBUMIN 3.5   No results for input(s): LIPASE, AMYLASE in the last 168 hours. No results for input(s): AMMONIA in the last 168 hours. CBC:  Recent Labs Lab 07/02/15 1417 07/03/15 0010 07/03/15 0455  WBC 14.2* 16.5* 13.9*  NEUTROABS 11.2*  --  11.2*  HGB 13.4 14.0 13.9  HCT 39.8 41.0 41.7  MCV 90.2 90.7 89.7  PLT 296 321 252   Cardiac Enzymes:  Recent Labs Lab 07/03/15 0010 07/03/15 0455 07/03/15 1151  TROPONINI <0.03 <0.03 <0.03   BNP (last 3 results)  Recent Labs  07/02/15 1827  BNP 20.4    ProBNP (last 3 results) No results for input(s): PROBNP in the last 8760 hours.  CBG:  Recent Labs Lab 07/03/15 0019 07/03/15 0551 07/03/15 1159  GLUCAP 240* 232* 222*    Micro No results found for this or any previous visit (from the past 240 hour(s)).    Studies: Dg Chest 2 View  07/02/2015  CLINICAL DATA:  Shortness of breath for 1 day. Several day history of chest pain on the left EXAM: CHEST  2 VIEW COMPARISON:  Chest radiograph August 08, 2014; chest CT August 09, 2014 FINDINGS: There is stable elevation of the left hemidiaphragm with atelectatic change in the left base. There is no edema or consolidation. Heart is upper normal in size with pulmonary vascularity within normal limits. There is atherosclerotic calcification in the aorta. No demonstrable adenopathy. There is degenerative change in the thoracic spine. No pneumothorax. IMPRESSION: Elevation left hemidiaphragm with atelectatic change left base. No edema or consolidation. Stable cardiac silhouette. Electronically Signed   By: Lowella Grip III M.D.   On: 07/02/2015 14:54   Ct Angio Chest Pe W/cm &/or Wo Cm  07/02/2015  CLINICAL DATA:  Hypoxia, shortness of breath, and back pain for several days. EXAM: CT ANGIOGRAPHY CHEST WITH CONTRAST TECHNIQUE: Multidetector CT imaging of the chest was performed using the standard protocol during bolus administration of intravenous contrast. Multiplanar CT image reconstructions and MIPs were obtained to evaluate the vascular anatomy. CONTRAST:  29mL OMNIPAQUE IOHEXOL 350 MG/ML SOLN COMPARISON:  08/09/2014 FINDINGS: Technically adequate study with good opacification of the central and segmental pulmonary arteries. No focal filling defects demonstrated. No evidence of significant pulmonary embolus. Pulmonary arteries are somewhat prominent suggesting possible arterial hypertension. Normal heart size. Normal caliber thoracic aorta with calcification. The esophagus is decompressed. No significant lymphadenopathy in the chest. Evaluation of lungs is limited due to respiratory motion artifact. There is atelectasis in the lung bases, greater on the left, with elevation of the left hemidiaphragm. No pleural effusions. No pneumothorax. Airways appear patent. There  is a circumscribed nodular lesion in the right breast measuring 3.2 cm diameter. Density of the lesion is consistent with fluid and fat. This may represent a postoperative cavity or area fat necrosis. Appearance is unchanged since previous study. Included portions of the upper abdominal organs demonstrate a distended gallbladder with increased density inside the gallbladder suggesting stones or sludge. Degenerative changes throughout the spine. Review of the MIP images confirms the above findings. IMPRESSION: No evidence of significant pulmonary embolus. Elevation of left hemidiaphragm. Atelectasis in the lung bases. Incidental note of a distended gallbladder with increased density suggesting stones or sludge. Electronically Signed   By: Lucienne Capers M.D.   On: 07/02/2015 21:39    Scheduled Meds: . amLODipine  10 mg Oral Daily  . anastrozole  1 mg Oral Daily  . antiseptic oral rinse  7 mL Mouth Rinse BID  . aspirin EC  325 mg Oral Daily  . enoxaparin (LOVENOX) injection  40 mg Subcutaneous Daily  . furosemide  40 mg Intravenous Q12H  . lisinopril  20 mg Oral QHS   And  . hydrochlorothiazide  25 mg Oral QHS  . insulin aspart  0-9 Units Subcutaneous TID WC  .  linagliptin  5 mg Oral Daily  . potassium chloride  20 mEq Oral Daily  . pramipexole  1 mg Oral BID  . sodium chloride flush  3 mL Intravenous Q12H   Continuous Infusions:      Time spent: 35 minutes    [facility]  [facility] Pager [phone] If 7PM-7AM, please contact night-coverage at [email or web address], password [facility] 07/03/2015, 3:00 PM  LOS: 1 day

## 2015-07-03 NOTE — Progress Notes (Signed)
RT NOTE:  Last night [name] did not tolerate CPAP. Her husband request we remove the mask and leave her alone to rest. She wore a nasal mask and did not tolerate that well due to her jaw dropping open multiple times. We do not keep chin straps here. Tonight I switch her over to a FFM. She tolerated this better. She does not tolerate high pressures and could only wear 6cm H20. 2L O2 bled in, humidity chamber filled. The patient was sleeping by the time I left. I will check her throughout the night.

## 2015-07-04 LAB — GLUCOSE, CAPILLARY
Glucose-Capillary: 228 mg/dL — ABNORMAL HIGH (ref 65–99)
Glucose-Capillary: 217 mg/dL — ABNORMAL HIGH (ref 65–99)
Glucose-Capillary: 205 mg/dL — ABNORMAL HIGH (ref 65–99)
Glucose-Capillary: 188 mg/dL — ABNORMAL HIGH (ref 65–99)

## 2015-07-04 LAB — BASIC METABOLIC PANEL
Anion gap: 9 (ref 5–15)
BUN: 18 mg/dL (ref 6–20)
CALCIUM: 8.9 mg/dL (ref 8.9–10.3)
CO2: 32 mmol/L (ref 22–32)
Chloride: 94 mmol/L — ABNORMAL LOW (ref 101–111)
Creatinine, Ser: 0.6 mg/dL (ref 0.44–1.00)
GFR calc Af Amer: >60 mL/min (ref >60)
Glucose, Bld: 247 mg/dL — ABNORMAL HIGH (ref 65–99)
POTASSIUM: 3.9 mmol/L (ref 3.5–5.1)
Sodium: 135 mmol/L (ref 135–145)

## 2015-07-04 LAB — MAGNESIUM: MAGNESIUM: 1.9 mg/dL (ref 1.7–2.4)

## 2015-07-04 MED ORDER — FUROSEMIDE 10 MG/ML IJ SOLN
60.0000 mg | Freq: Three times a day (TID) | INTRAMUSCULAR | Status: DC
  Administered 2015-07-04 – 2015-07-07 (×9): 60 mg via INTRAVENOUS
  Filled 2015-07-04 (×10): qty 6

## 2015-07-04 NOTE — Progress Notes (Signed)
RT NOTE:  RN called, pt took CPAP off and stated she was not going to wear it any longer tonight. RN placed pt back on [place].

## 2015-07-04 NOTE — Progress Notes (Signed)
[facility] PROGRESS NOTE   [name] [id number] DOB: Jul 19, 1940 DOA: 07/02/2015 PCP: Colin Benton R., DO  HPI/Subjective: Feels okay, slightly better than yesterday. Still has lower extremity edema.  Assessment/Plan: Principal Problem:   Acute respiratory failure with hypoxia (HCC) Active Problems:   Breast cancer, right breast (HCC)   Obstructive sleep apnea   RESTLESS LEGS SYNDROME   Hearing loss - has hearing aides, follow by audiologist   Type 2 diabetes mellitus with diabetic neuropathy ([place])   Acute respiratory failure ([place])    Acute respiratory failure Patient came into the hospital with shortness of breath and oxygen saturation in the 80s. Patient required supplemental oxygen to keep oxygen saturation above 90%. This is likely secondary to acute congestive heart failure. Improving.  Acute congestive heart failure Presented with DOE, orthopnea and lower extremity edema. 2-D echo pending, the BNP is not elevated. Treated with Lasix, follow patient clinically. Restrict salt and fluid intake. I'll increase Lasix dose, continued follow intake/output.  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 Hemoglobin A1c was 8.1 on 04/18/2015  Restless leg syndrome Continue home medications.  History of right-sided breast cancer Right-sided breast cancer with lesion seen again in the CT scan but no change since last study.   Code Status: Full Code Family Communication: Plan discussed with the patient. Disposition Plan: Remains inpatient Diet: Diet heart healthy/carb modified Room service appropriate?: Yes; Fluid consistency:: Thin; Fluid restriction:: 1200 mL Fluid  Consultants:  None   Procedures:  None   Antibiotics:  None  (indicate start date, and stop date if known)   Objective: Filed Vitals:   07/04/15 0426 07/04/15 1138  BP: 126/72 123/61  Pulse: 91 91  Temp: 98.2 F  (36.8 C) 98 F (36.7 C)  Resp: 18 18    Intake/Output Summary (Last 24 hours) at 07/04/15 1247 Last data filed at 07/04/15 0845  Gross per 24 hour  Intake    960 ml  Output   1275 ml  Net   -315 ml   Filed Weights   07/02/15 2341 07/03/15 0315 07/04/15 0243  Weight: 91.264 kg (201 lb 3.2 oz) 91.264 kg (201 lb 3.2 oz) 90.583 kg (199 lb 11.2 oz)    Exam: General: Alert and awake, oriented x3, not in any acute distress. HEENT: anicteric sclera, pupils reactive to light and accommodation, EOMI CVS: S1-S2 clear, no murmur rubs or gallops Chest: clear to auscultation bilaterally, no wheezing, rales or rhonchi Abdomen: soft nontender, nondistended, normal bowel sounds, no organomegaly Extremities: no cyanosis, clubbing or edema noted bilaterally Neuro: Cranial nerves II-XII intact, no focal neurological deficits  Data Reviewed: Basic Metabolic Panel:  Recent Labs Lab 07/02/15 1417 07/03/15 0010 07/03/15 0455 07/04/15 0500  NA 136  --  134* 135  K 3.2*  --  3.3* 3.9  CL 93*  --  92* 94*  CO2 30  --  30 32  GLUCOSE 171*  --  244* 247*  BUN 17  --  13 18  CREATININE 0.69 0.60 0.53 0.60  CALCIUM 9.4  --  8.6* 8.9  MG  --  1.6*  --  1.9   Liver Function Tests:  Recent Labs Lab 07/03/15 0010  AST 18  ALT 16  ALKPHOS 84  BILITOT 0.8  PROT 7.9  ALBUMIN 3.5   No results for input(s): LIPASE, AMYLASE in the last 168 hours. No results for input(s): AMMONIA in the last 168 hours. CBC:  Recent  Labs Lab 07/02/15 1417 07/03/15 0010 07/03/15 0455  WBC 14.2* 16.5* 13.9*  NEUTROABS 11.2*  --  11.2*  HGB 13.4 14.0 13.9  HCT 39.8 41.0 41.7  MCV 90.2 90.7 89.7  PLT 296 321 252   Cardiac Enzymes:  Recent Labs Lab 07/03/15 0010 07/03/15 0455 07/03/15 1151  TROPONINI <0.03 <0.03 <0.03   BNP (last 3 results)  Recent Labs  07/02/15 1827  BNP 20.4    ProBNP (last 3 results) No results for input(s): PROBNP in the last 8760 hours.  CBG:  Recent Labs Lab  07/03/15 1159 07/03/15 1709 07/03/15 2101 07/04/15 0617 07/04/15 1203  GLUCAP 222* 201* 199* 228* 217*    Micro No results found for this or any previous visit (from the past 240 hour(s)).   Studies: Dg Chest 2 View  07/02/2015  CLINICAL DATA:  Shortness of breath for 1 day. Several day history of chest pain on the left EXAM: CHEST  2 VIEW COMPARISON:  Chest radiograph August 08, 2014; chest CT August 09, 2014 FINDINGS: There is stable elevation of the left hemidiaphragm with atelectatic change in the left base. There is no edema or consolidation. Heart is upper normal in size with pulmonary vascularity within normal limits. There is atherosclerotic calcification in the aorta. No demonstrable adenopathy. There is degenerative change in the thoracic spine. No pneumothorax. IMPRESSION: Elevation left hemidiaphragm with atelectatic change left base. No edema or consolidation. Stable cardiac silhouette. Electronically Signed   By: Lowella Grip III M.D.   On: 07/02/2015 14:54   Ct Angio Chest Pe W/cm &/or Wo Cm  07/02/2015  CLINICAL DATA:  Hypoxia, shortness of breath, and back pain for several days. EXAM: CT ANGIOGRAPHY CHEST WITH CONTRAST TECHNIQUE: Multidetector CT imaging of the chest was performed using the standard protocol during bolus administration of intravenous contrast. Multiplanar CT image reconstructions and MIPs were obtained to evaluate the vascular anatomy. CONTRAST:  59mL OMNIPAQUE IOHEXOL 350 MG/ML SOLN COMPARISON:  08/09/2014 FINDINGS: Technically adequate study with good opacification of the central and segmental pulmonary arteries. No focal filling defects demonstrated. No evidence of significant pulmonary embolus. Pulmonary arteries are somewhat prominent suggesting possible arterial hypertension. Normal heart size. Normal caliber thoracic aorta with calcification. The esophagus is decompressed. No significant lymphadenopathy in the chest. Evaluation of lungs is limited due to  respiratory motion artifact. There is atelectasis in the lung bases, greater on the left, with elevation of the left hemidiaphragm. No pleural effusions. No pneumothorax. Airways appear patent. There is a circumscribed nodular lesion in the right breast measuring 3.2 cm diameter. Density of the lesion is consistent with fluid and fat. This may represent a postoperative cavity or area fat necrosis. Appearance is unchanged since previous study. Included portions of the upper abdominal organs demonstrate a distended gallbladder with increased density inside the gallbladder suggesting stones or sludge. Degenerative changes throughout the spine. Review of the MIP images confirms the above findings. IMPRESSION: No evidence of significant pulmonary embolus. Elevation of left hemidiaphragm. Atelectasis in the lung bases. Incidental note of a distended gallbladder with increased density suggesting stones or sludge. Electronically Signed   By: Lucienne Capers M.D.   On: 07/02/2015 21:39    Scheduled Meds: . amLODipine  10 mg Oral Daily  . anastrozole  1 mg Oral Daily  . antiseptic oral rinse  7 mL Mouth Rinse BID  . aspirin EC  325 mg Oral Daily  . enoxaparin (LOVENOX) injection  40 mg Subcutaneous Daily  . furosemide  40  mg Intravenous Q12H  . lisinopril  20 mg Oral QHS   And  . hydrochlorothiazide  25 mg Oral QHS  . insulin aspart  0-9 Units Subcutaneous TID WC  . linagliptin  5 mg Oral Daily  . pramipexole  1 mg Oral BID  . sodium chloride flush  3 mL Intravenous Q12H   Continuous Infusions:      Time spent: 35 minutes    [facility] A  [facility] Pager [phone] If 7PM-7AM, please contact night-coverage at [email or web address], password [facility] 07/04/2015, 12:47 PM  LOS: 2 days

## 2015-07-04 NOTE — Progress Notes (Signed)
Inpatient Diabetes Program Recommendations  AACE/ADA: New Consensus Statement on Inpatient Glycemic Control (2015)  Target Ranges:  Prepandial:   less than 140 mg/dL      Peak postprandial:   less than 180 mg/dL (1-2 hours)      Critically ill patients:  140 - 180 mg/dL   Results for [name], [name] (MRN [id number]) as of 07/04/2015 09:28  Ref. Range 07/03/2015 05:51 07/03/2015 11:59 07/03/2015 17:09 07/03/2015 21:01 07/04/2015 06:17  Glucose-Capillary Latest Ref Range: 65-99 mg/dL 232 (H) 222 (H) 201 (H) 199 (H) 228 (H)   Review of Glycemic Control  Diabetes history: DM 2 Outpatient Diabetes medications: Metformin 1000 mg BID, Januvia 100 Daily Current orders for Inpatient glycemic control: Novolog Sensitive TID, Tradjenta 5 mg Daily  Inpatient Diabetes Program Recommendations: Correction (SSI): Glucose in 200's, Please consider increasing correction to Novolog Moderate TID and add HS scale.  Thanks,  Tama Headings RN, MSN, [facility] Inpatient Diabetes Coordinator Team Pager [phone] (8a-5p)

## 2015-07-04 NOTE — Evaluation (Signed)
Physical Therapy Evaluation Patient Details Name: [name] MRN: [id number] DOB: [birth year] Today's Date: 07/04/2015   History of Present Illness  75 y.o. female with history of breast cancer in remission, diabetes mellitus type 2, hypertension was referred to the ER after patient was having increasing shortness of breath on exertion with lower extremity edema.  Clinical Impression  Patient demonstrates deficits in functional mobility as indicated below. Will benefit from continued skilled PT to address deficits and maximize function. Will see as indicated and progress as tolerated.  OF NOTE: Patient reporting pain Left calf, patient also DOE with minimal activity, several rest breaks required. Ambulated on 3 liters with increased WOB.    Follow Up Recommendations Home health PT;Supervision for mobility/OOB (pending progress, may have no follow up needs)    Equipment Recommendations  None recommended by PT    Recommendations for Other Services       Precautions / Restrictions Precautions Precautions: Fall (oxygen) Restrictions Weight Bearing Restrictions: No      Mobility  Bed Mobility               General bed mobility comments: received sitting EOB  Transfers Overall transfer level: Needs assistance Equipment used: None Transfers: Sit to/from Stand Sit to Stand: Min assist         General transfer comment: Min assist to power up from bed secondary to left hand pain, patient used elbow to push through therapist support. upon standing min guard for stability  Ambulation/Gait Ambulation/Gait assistance: Min guard Ambulation Distance (Feet): 60 Feet Assistive device: Straight cane Gait Pattern/deviations: Step-to pattern;Decreased stride length;Antalgic;Drifts right/left Gait velocity: decreased Gait velocity interpretation: Below normal speed for age/gender General Gait Details: patient very stiff and guarded with gait, antalgic. increased WOB noted (DOE  noted)  Financial trader Rankin (Stroke Patients Only)       Balance Overall balance assessment: Needs assistance Sitting-balance support: Feet supported Sitting balance-Leahy Scale: Fair Sitting balance - Comments: sitting EOB and dynamic sitting during hygiene   Standing balance support: No upper extremity supported;During functional activity Standing balance-Leahy Scale: Fair Standing balance comment: some instability, occassional use on single UE support required                             Pertinent Vitals/Pain Pain Assessment: Faces Faces Pain Scale: Hurts even more Pain Location: left calf, and also sore at previous IV insertion site Pain Descriptors / Indicators: Grimacing;Guarding;Sore Pain Intervention(s): Monitored during session;Limited activity within patient's tolerance;Repositioned    Home Living Family/patient expects to be discharged to:: Private residence Living Arrangements: Spouse/significant other;Other relatives (daughter and husband) Available Help at Discharge: Family Type of Home: House Home Access: Stairs to enter Entrance Stairs-Rails: Psychiatric nurse of Steps: 2 Home Layout: One level Home Equipment: Environmental consultant - 2 wheels;Cane - single point;Bedside commode      Prior Function Level of Independence: Independent         Comments: modified independent in community uses a cane      Hand Dominance   Dominant Hand: Right    Extremity/Trunk Assessment   Upper Extremity Assessment: Defer to OT evaluation (LUE pain limiting use)           Lower Extremity Assessment: Generalized weakness (difficulty to assess secondary to pain LLE)      Cervical / Trunk Assessment:  (increased body habitus)  Communication   Communication: No difficulties  Cognition Arousal/Alertness: Awake/alert Behavior During Therapy: WFL for tasks assessed/performed Overall Cognitive Status:  Within Functional Limits for tasks assessed                      General Comments General comments (skin integrity, edema, etc.): increased LE edema noted bilateral LEs. Some pain in LLE (nsg aware)  Educated on elevation for edema control    Exercises        Assessment/Plan    PT Assessment Patient needs continued PT services  PT Diagnosis Difficulty walking;Abnormality of gait;Generalized weakness;Acute pain   PT Problem List Decreased strength;Decreased activity tolerance;Decreased balance;Decreased mobility;Decreased coordination;Obesity;Pain;Cardiopulmonary status limiting activity  PT Treatment Interventions DME instruction;Gait training;Stair training;Functional mobility training;Therapeutic activities;Therapeutic exercise;Balance training;Patient/family education   PT Goals (Current goals can be found in the Care Plan section) Acute Rehab PT Goals Patient Stated Goal: to go home PT Goal Formulation: With patient Time For Goal Achievement: 07/18/15 Potential to Achieve Goals: Good    Frequency Min 3X/week   Barriers to discharge        Co-evaluation               End of Session Equipment Utilized During Treatment: Gait belt;Oxygen Activity Tolerance: Patient tolerated treatment well;Patient limited by fatigue Patient left: in chair;with call bell/phone within reach Nurse Communication: Mobility status (LLE calf pain)         Time: [id number] PT Time Calculation (min) (ACUTE ONLY): 33 min   Charges:   PT Evaluation $PT Eval Moderate Complexity: 1 Procedure PT Treatments $Therapeutic Activity: 8-22 mins   PT G CodesDuncan Dull 07-18-2015, 9:37 AM Alben Deeds, PT DPT  [phone]

## 2015-07-04 NOTE — Evaluation (Signed)
Occupational Therapy Evaluation Patient Details Name: [name] MRN: [id number] DOB: 08/30/1940 Today's Date: 07/04/2015    History of Present Illness 75 y.o. female with history of breast cancer in remission, diabetes mellitus type 2, hypertension was referred to the ER after patient was having increasing shortness of breath on exertion with lower extremity edema.   Clinical Impression   Pt admitted with the above diagnosis and has the deficits listed below. Pt would benefit from cont OT to attempt to reach baseline with adls so she can return home with her husband.  Pt is close to her baseline and husband is available 24/7 if necessary.    Follow Up Recommendations  Home health OT;Supervision - Intermittent    Equipment Recommendations  None recommended by OT    Recommendations for Other Services       Precautions / Restrictions Precautions Precautions: Fall Precaution Comments: Pt has had 2 falls in last year while out in community. Restrictions Weight Bearing Restrictions: No      Mobility Bed Mobility               General bed mobility comments: received sitting EOB  Transfers Overall transfer level: Needs assistance Equipment used: None Transfers: Sit to/from Stand Sit to Stand: Min guard         General transfer comment: Pt did well standing from chair only needing min guard.    Balance Overall balance assessment: Needs assistance Sitting-balance support: Feet supported Sitting balance-Leahy Scale: Good Sitting balance - Comments: sitting EOB and dynamic sitting during hygiene   Standing balance support: Bilateral upper extremity supported;During functional activity Standing balance-Leahy Scale: Fair Standing balance comment: Pt can let go of cane to stand for short periods of time at sink                            ADL Overall ADL's : Needs assistance/impaired Eating/Feeding: Set up;Sitting Eating/Feeding Details (indicate cue  type and reason): assist with set up due to guarding LUE Grooming: Wash/dry hands;Wash/dry face;Oral care;Supervision/safety;Standing   Upper Body Bathing: Set up;Sitting   Lower Body Bathing: Minimal assistance;Sit to/from stand Lower Body Bathing Details (indicate cue type and reason): min assist to cross L leg over and bathe foot.  Min guard to stand. Upper Body Dressing : Set up;Sitting Upper Body Dressing Details (indicate cue type and reason): assist with fasteners and buttons due to LUE stiffness. Lower Body Dressing: Minimal assistance;Sit to/from stand Lower Body Dressing Details (indicate cue type and reason): min assist with L sock and shoe and fasteners. Toilet Transfer: Min guard;Ambulation;Comfort height toilet;Grab bars Toilet Transfer Details (indicate cue type and reason): walked to bathroom with cane Toileting- Clothing Manipulation and Hygiene: Minimal assistance;Sit to/from stand Toileting - Clothing Manipulation Details (indicate cue type and reason): had difficulty pulling up underwear on L due to L hand pain.     Functional mobility during ADLs: Min guard;Cane General ADL Comments: Pt doing well with adls but feels stiff and slow with movement. Pt guarding LUE due to pain from IV. Encouraged her to move it as much as possible.     Vision Vision Assessment?: No apparent visual deficits   Perception     Praxis      Pertinent Vitals/Pain Pain Assessment: Faces Pain Score: 3  Faces Pain Scale: Hurts a little bit Pain Location: L arm (iV just removed) Pain Descriptors / Indicators: Guarding Pain Intervention(s): Limited activity within patient's tolerance;Repositioned;Monitored during session  Hand Dominance Right   Extremity/Trunk Assessment Upper Extremity Assessment Upper Extremity Assessment: LUE deficits/detail LUE Deficits / Details: Pt guards LUE.  IV just removed from back of hand.  Strength not tested but at least 3/5 throughout hand and 4/5 in  elbow and shoulder.  Pt with stiffness in hands.  Encouraged mobility. LUE: Unable to fully assess due to pain LUE Coordination: decreased fine motor   Lower Extremity Assessment Lower Extremity Assessment: Defer to PT evaluation   Cervical / Trunk Assessment Cervical / Trunk Assessment: Normal   Communication Communication Communication: HOH   Cognition Arousal/Alertness: Awake/alert Behavior During Therapy: WFL for tasks assessed/performed Overall Cognitive Status: Within Functional Limits for tasks assessed                     General Comments       Exercises Exercises: Other exercises Other Exercises Other Exercises: Pt completed 10 reps of basic shoulder flexion/extension, elbow and wrist flexion and extension, and finger ROM just to encourage more movement in this limb.   Shoulder Instructions      Home Living Family/patient expects to be discharged to:: Private residence Living Arrangements: Spouse/significant other;Other relatives Available Help at Discharge: Family Type of Home: House Home Access: Stairs to enter [employer] of Steps: 2 Entrance Stairs-Rails: Right;Left Home Layout: One level     Bathroom Shower/Tub: Occupational psychologist: Handicapped height     Home Equipment: Environmental consultant - 2 wheels;Cane - single point;Bedside commode          Prior Functioning/Environment Level of Independence: Independent with assistive device(s)        Comments: modified independent in community uses a cane     OT Diagnosis: Generalized weakness;Acute pain   OT Problem List: Decreased strength;Decreased range of motion;Impaired balance (sitting and/or standing);Decreased knowledge of use of DME or AE;Impaired UE functional use;Pain   OT Treatment/Interventions: Self-care/ADL training;DME and/or AE instruction;Energy conservation;Therapeutic activities    OT Goals(Current goals can be found in the care plan section) Acute Rehab OT  Goals Patient Stated Goal: to go home OT Goal Formulation: With patient Time For Goal Achievement: 07/11/15 Potential to Achieve Goals: Good ADL Goals Pt Will Perform Grooming: with modified independence;standing Pt Will Perform Lower Body Dressing: with modified independence;sit to/from stand Pt/caregiver will Perform Home Exercise Program: Increased ROM;Left upper extremity;With Supervision Additional ADL Goal #1: Pt will walk to bathroom and do all toileting with mod I. Additional ADL Goal #2: Pt will state 3 things she can do differently at home to attempt to conserve energy during adls.  OT Frequency: Min 2X/week   Barriers to D/C:            Co-evaluation              End of Session Equipment Utilized During Treatment: Oxygen Nurse Communication: Mobility status  Activity Tolerance: Patient tolerated treatment well Patient left: in chair;with call bell/phone within reach   Time: [id number] OT Time Calculation (min): 25 min Charges:  OT General Charges $OT Visit: 1 Procedure OT Evaluation $OT Eval Moderate Complexity: 1 Procedure OT Treatments $Self Care/Home Management : 8-22 mins G-Codes:    Glenford Peers July 19, 2015, 10:14 AM  [phone]

## 2015-07-04 NOTE — Care Management Note (Signed)
Case Management Note  Patient Details  Name: [name] MRN: [id number] Date of Birth: 19-Dec-1940  Subjective/Objective:        Admitted with Acute Resp Failure            Action/Plan: Patient lives at home with spouse, private insurance with [facility] Medicare with prescription drug coverage;pharmacy of choice is [facility]; no problems getting her medication. She has a rolling walker, cane, 3:1 at home from when she had a knee replacement. [place] choices offered, patient chose [place] with [facility] called for arrangements. Patient is also agreeable to the [facility] program with [facility] for CHF. Attending MD please enter the face to face in Epic for [facility] services at discharge.  Expected Discharge Date:     Possibly 07/15/2015             Expected Discharge Plan:  [place]  Discharge planning Services  CM Consult  Choice offered to:  Patient    HH Arranged:  RN, Disease Management, PT, OT HH Agency:  [place]  Status of Service:  In process, will continue to follow  Samul Dada [id number] 07/04/2015, 11:01 AM

## 2015-07-04 NOTE — Progress Notes (Signed)
Pt a/o no c/o pain, pt refused to keep her CPAP on 2L nasal canula on, RT notified, VSS, pt stable

## 2015-07-05 DIAGNOSIS — H9193 Unspecified hearing loss, bilateral: Secondary | ICD-10-CM

## 2015-07-05 LAB — GLUCOSE, CAPILLARY
GLUCOSE-CAPILLARY: 279 mg/dL — AB (ref 65–99)
GLUCOSE-CAPILLARY: 246 mg/dL — AB (ref 65–99)
Glucose-Capillary: 190 mg/dL — ABNORMAL HIGH (ref 65–99)
Glucose-Capillary: 211 mg/dL — ABNORMAL HIGH (ref 65–99)

## 2015-07-05 LAB — BASIC METABOLIC PANEL
Anion gap: 14 (ref 5–15)
BUN: 20 mg/dL (ref 6–20)
CALCIUM: 9.2 mg/dL (ref 8.9–10.3)
CHLORIDE: 86 mmol/L — AB (ref 101–111)
CO2: 31 mmol/L (ref 22–32)
CREATININE: 0.66 mg/dL (ref 0.44–1.00)
GFR calc Af Amer: >60 mL/min (ref >60)
GFR calc non Af Amer: >60 mL/min (ref >60)
GLUCOSE: 219 mg/dL — AB (ref 65–99)
Potassium: 3.5 mmol/L (ref 3.5–5.1)
Sodium: 131 mmol/L — ABNORMAL LOW (ref 135–145)

## 2015-07-05 MED ORDER — POTASSIUM CHLORIDE CRYS ER 20 MEQ PO TBCR
40.0000 meq | EXTENDED_RELEASE_TABLET | Freq: Two times a day (BID) | ORAL | Status: AC
  Administered 2015-07-05 (×2): 40 meq via ORAL
  Filled 2015-07-05 (×2): qty 2

## 2015-07-05 MED ORDER — MAGNESIUM SULFATE 2 GM/50ML IV SOLN
2.0000 g | Freq: Once | INTRAVENOUS | Status: AC
  Administered 2015-07-05: 2 g via INTRAVENOUS
  Filled 2015-07-05: qty 50

## 2015-07-05 NOTE — Progress Notes (Signed)
Inpatient Diabetes Program Recommendations  AACE/ADA: New Consensus Statement on Inpatient Glycemic Control (2015)  Target Ranges:  Prepandial:   less than 140 mg/dL      Peak postprandial:   less than 180 mg/dL (1-2 hours)      Critically ill patients:  140 - 180 mg/dL   Results for [name], [name] (MRN [id number]) as of 07/05/2015 12:47  Ref. Range 07/04/2015 06:17 07/04/2015 12:03 07/04/2015 18:24 07/04/2015 20:39 07/05/2015 05:45 07/05/2015 11:51  Glucose-Capillary Latest Ref Range: 65-99 mg/dL 228 (H) 217 (H) 205 (H) 188 (H) 190 (H) 279 (H)   Review of Glycemic Control  Diabetes history: DM2 Outpatient Diabetes medications: Metformin 1000 mg BID, Januvia 100 Daily Current orders for Inpatient glycemic control: Novolog 0-9 units TID with meals, Tradjenta 5 mg Daily  Inpatient Diabetes Program Recommendations: Correction (SSI): Glucose has ranged from 188-279 mg/dl over the past 24 hours.  Please consider increasing correction to Novolog Moderate scale and ADD bedtime Novolog correction scale.  Thanks, Barnie Alderman, RN, MSN, CDE Diabetes Coordinator Inpatient Diabetes Program [phone] (Team Pager from [place] to [place]) [phone] ([facility] office) [phone] [facility] office) [phone] [facility] office)

## 2015-07-05 NOTE — Progress Notes (Signed)
Pt a/o, no c/o pain, husband at bedside, VSS, pt stable, pt resting comfortably

## 2015-07-05 NOTE — Care Management Important Message (Signed)
Important Message  Patient Details  Name: [name] MRN: [id number] Date of Birth: 09/12/1940   Medicare Important Message Given:  Yes    Barb Merino Roshun Klingensmith 07/05/2015, 11:44 AM

## 2015-07-05 NOTE — Progress Notes (Signed)
Utilization review completed. Ricke Kimoto, RN, BSN. 

## 2015-07-05 NOTE — Progress Notes (Signed)
[facility] PROGRESS NOTE   [name] [id number] DOB: 17-Dec-1940 DOA: 07/02/2015 PCP: Colin Benton R., DO  HPI/Subjective: Denies any new complaints, seen with husband at bedside. [place] explained duplex pending. Feels better than the day she came in but still not back to her baseline.  Assessment/Plan: Principal Problem:   Acute respiratory failure with hypoxia (HCC) Active Problems:   Breast cancer, right breast (HCC)   Obstructive sleep apnea   RESTLESS LEGS SYNDROME   Hearing loss - has hearing aides, follow by audiologist   Type 2 diabetes mellitus with diabetic neuropathy ([place])   Acute respiratory failure ([place])    Acute respiratory failure Patient came into the hospital with shortness of breath and oxygen saturation in the 80s. Patient required supplemental oxygen to keep oxygen saturation above 90%. This is likely secondary to acute congestive heart failure. Wean off of oxygen as tolerated.  Acute congestive heart failure Presented with DOE, orthopnea and lower extremity edema. 2-D echo normal LVEF of 60-65% with grade 1 diastolic dysfunction, the BNP is not elevated. Treated with Lasix, follow patient clinically. Restrict salt and fluid intake. On 16 mg of Lasix 3 times a day, weight is barely down 3 pounds since admission.  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 Hemoglobin A1c was 8.1 on 04/18/2015  Restless leg syndrome Continue home medications.  History of right-sided breast cancer Right-sided breast cancer with lesion seen again in the CT scan but no change since last study.   Code Status: Full Code Family Communication: Plan discussed with the patient. Disposition Plan: Remains inpatient Diet: Diet heart healthy/carb modified Room service appropriate?: Yes; Fluid consistency:: Thin; Fluid restriction:: 1200 mL Fluid  Consultants:  None   Procedures:  None    Antibiotics:  None  (indicate start date, and stop date if known)   Objective: Filed Vitals:   07/05/15 0546 07/05/15 0937  BP: 128/87 127/62  Pulse: 70 81  Temp: 98.5 F (36.9 C) 98.3 F (36.8 C)  Resp: 18 24    Intake/Output Summary (Last 24 hours) at 07/05/15 1209 Last data filed at 07/05/15 1146  Gross per 24 hour  Intake    480 ml  Output    400 ml  Net     80 ml   Filed Weights   07/03/15 0315 07/04/15 0243 07/05/15 0418  Weight: 91.264 kg (201 lb 3.2 oz) 90.583 kg (199 lb 11.2 oz) 89.812 kg (198 lb)    Exam: General: Alert and awake, oriented x3, not in any acute distress. HEENT: anicteric sclera, pupils reactive to light and accommodation, EOMI CVS: S1-S2 clear, no murmur rubs or gallops Chest: clear to auscultation bilaterally, no wheezing, rales or rhonchi Abdomen: soft nontender, nondistended, normal bowel sounds, no organomegaly Extremities: no cyanosis, clubbing or edema noted bilaterally Neuro: Cranial nerves II-XII intact, no focal neurological deficits  Data Reviewed: Basic Metabolic Panel:  Recent Labs Lab 07/02/15 1417 07/03/15 0010 07/03/15 0455 07/04/15 0500 07/05/15 0420  NA 136  --  134* 135 131*  K 3.2*  --  3.3* 3.9 3.5  CL 93*  --  92* 94* 86*  CO2 30  --  30 32 31  GLUCOSE 171*  --  244* 247* 219*  BUN 17  --  13 18 20   CREATININE 0.69 0.60 0.53 0.60 0.66  CALCIUM 9.4  --  8.6* 8.9 9.2  MG  --  1.6*  --  1.9  --  Liver Function Tests:  Recent Labs Lab 07/03/15 0010  AST 18  ALT 16  ALKPHOS 84  BILITOT 0.8  PROT 7.9  ALBUMIN 3.5   No results for input(s): LIPASE, AMYLASE in the last 168 hours. No results for input(s): AMMONIA in the last 168 hours. CBC:  Recent Labs Lab 07/02/15 1417 07/03/15 0010 07/03/15 0455  WBC 14.2* 16.5* 13.9*  NEUTROABS 11.2*  --  11.2*  HGB 13.4 14.0 13.9  HCT 39.8 41.0 41.7  MCV 90.2 90.7 89.7  PLT 296 321 252   Cardiac Enzymes:  Recent Labs Lab 07/03/15 0010  07/03/15 0455 07/03/15 1151  TROPONINI <0.03 <0.03 <0.03   BNP (last 3 results)  Recent Labs  07/02/15 1827  BNP 20.4    ProBNP (last 3 results) No results for input(s): PROBNP in the last 8760 hours.  CBG:  Recent Labs Lab 07/04/15 1203 07/04/15 1824 07/04/15 2039 07/05/15 0545 07/05/15 1151  GLUCAP 217* 205* 188* 190* 279*    Micro No results found for this or any previous visit (from the past 240 hour(s)).   Studies: No results found.  Scheduled Meds: . amLODipine  10 mg Oral Daily  . anastrozole  1 mg Oral Daily  . antiseptic oral rinse  7 mL Mouth Rinse BID  . aspirin EC  325 mg Oral Daily  . enoxaparin (LOVENOX) injection  40 mg Subcutaneous Daily  . furosemide  60 mg Intravenous 3 times per day  . insulin aspart  0-9 Units Subcutaneous TID WC  . linagliptin  5 mg Oral Daily  . potassium chloride  40 mEq Oral BID  . pramipexole  1 mg Oral BID  . sodium chloride flush  3 mL Intravenous Q12H   Continuous Infusions:      Time spent: 35 minutes    [facility] A  [facility] Pager [phone] If 7PM-7AM, please contact night-coverage at [email or web address], password [facility] 07/05/2015, 12:09 PM  LOS: 3 days

## 2015-07-05 NOTE — Progress Notes (Signed)
Physical Therapy Treatment Patient Details Name: [name] MRN: [id number] DOB: 12-Jun-1940 Today's Date: 07/05/2015    History of Present Illness 75 y.o. female with history of breast cancer in remission, diabetes mellitus type 2, hypertension was referred to the ER after patient was having increasing shortness of breath on exertion with lower extremity edema.    PT Comments    Patient seen for progression of activity. Tolerated increased ambulation on room air with saturations dropping to 88%, rebounded to 91% with rest and pursed lip breathing. No evidence of DOE with today's activity. Patient sitting EOB for lunch at conclusion of session with son present. 2 liters O2 at rest 95% post activity   Follow Up Recommendations  Home health PT;Supervision for mobility/OOB (pending progress, may have no follow up needs)     Equipment Recommendations  None recommended by PT    Recommendations for Other Services       Precautions / Restrictions Precautions Precautions: Fall (oxygen) Restrictions Weight Bearing Restrictions: No    Mobility  Bed Mobility Overal bed mobility: Needs Assistance Bed Mobility: Supine to Sit     Supine to sit: Supervision     General bed mobility comments: no physical assist needed, increased time to perform  Transfers Overall transfer level: Needs assistance Equipment used: None Transfers: Sit to/from Stand Sit to Stand: Min guard         General transfer comment: no physical assist required this session. Min guard for safety. Utilized RW, VCs for upright posture  Ambulation/Gait Ambulation/Gait assistance: Supervision;Min guard Ambulation Distance (Feet): 180 Feet Assistive device: Rolling walker (2 wheeled) Gait Pattern/deviations: Step-through pattern;Decreased stride length;Trunk flexed Gait velocity: decreased Gait velocity interpretation: Below normal speed for age/gender General Gait Details: modest antaglia when mobilizing due  to foot pain, 2 standing rest breaks, desaturated to 88% on room air, rebounded to 91% with pursed lip breathing and standing rest break. (DOE noted)   Financial trader Rankin (Stroke Patients Only)       Balance     Sitting balance-Leahy Scale: Fair Sitting balance - Comments: sitting EOB and dynamic sitting during hygiene   Standing balance support: Bilateral upper extremity supported Standing balance-Leahy Scale: Fair                      Cognition Arousal/Alertness: Awake/alert Behavior During Therapy: WFL for tasks assessed/performed Overall Cognitive Status: Within Functional Limits for tasks assessed                      Exercises      General Comments General comments (skin integrity, edema, etc.): educated and performed technique for pursed lip breathing      Pertinent Vitals/Pain Pain Assessment: Faces Faces Pain Scale: Hurts little more Pain Location: complaining of pain in feet today and left calf TTP Pain Descriptors / Indicators: Grimacing;Guarding;Sore Pain Intervention(s): Monitored during session;Limited activity within patient's tolerance;Repositioned    Home Living                      Prior Function            PT Goals (current goals can now be found in the care plan section) Acute Rehab PT Goals Patient Stated Goal: to go home PT Goal Formulation: With patient Time For Goal Achievement: 07/18/15 Potential to Achieve Goals: Good Progress towards PT goals: Progressing  toward goals    Frequency  Min 3X/week    PT Plan Current plan remains appropriate    Co-evaluation             End of Session Equipment Utilized During Treatment: Gait belt;Oxygen Activity Tolerance: Patient tolerated treatment well;Patient limited by fatigue Patient left: in bed;with call bell/phone within reach;with family/visitor present (sitting EOB)     Time: [id number] PT Time Calculation  (min) (ACUTE ONLY): 18 min  Charges:  $Gait Training: 8-22 mins                    G CodesDuncan Dull 2015-07-18, 1:56 PM Alben Deeds, [place] DPT  [phone]

## 2015-07-06 ENCOUNTER — Inpatient Hospital Stay (HOSPITAL_COMMUNITY): Payer: Commercial Managed Care - HMO

## 2015-07-06 DIAGNOSIS — M79662 Pain in left lower leg: Secondary | ICD-10-CM

## 2015-07-06 LAB — GLUCOSE, CAPILLARY
GLUCOSE-CAPILLARY: 238 mg/dL — AB (ref 65–99)
GLUCOSE-CAPILLARY: 282 mg/dL — AB (ref 65–99)
Glucose-Capillary: 246 mg/dL — ABNORMAL HIGH (ref 65–99)
Glucose-Capillary: 223 mg/dL — ABNORMAL HIGH (ref 65–99)

## 2015-07-06 LAB — RENAL FUNCTION PANEL
Albumin: 2.9 g/dL — ABNORMAL LOW (ref 3.5–5.0)
Anion gap: 13 (ref 5–15)
BUN: 22 mg/dL — ABNORMAL HIGH (ref 6–20)
CO2: 33 mmol/L — ABNORMAL HIGH (ref 22–32)
Calcium: 9 mg/dL (ref 8.9–10.3)
Chloride: 87 mmol/L — ABNORMAL LOW (ref 101–111)
Creatinine, Ser: 0.66 mg/dL (ref 0.44–1.00)
GFR calc Af Amer: >60 mL/min
GFR calc non Af Amer: >60 mL/min
Glucose, Bld: 241 mg/dL — ABNORMAL HIGH (ref 65–99)
Phosphorus: 3.6 mg/dL (ref 2.5–4.6)
Potassium: 3.6 mmol/L (ref 3.5–5.1)
Sodium: 133 mmol/L — ABNORMAL LOW (ref 135–145)

## 2015-07-06 LAB — MAGNESIUM: MAGNESIUM: 1.8 mg/dL (ref 1.7–2.4)

## 2015-07-06 MED ORDER — METOLAZONE 5 MG PO TABS
5.0000 mg | ORAL_TABLET | Freq: Once | ORAL | Status: AC
  Administered 2015-07-06: 5 mg via ORAL
  Filled 2015-07-06: qty 1

## 2015-07-06 MED ORDER — MAGNESIUM SULFATE 2 GM/50ML IV SOLN
2.0000 g | Freq: Once | INTRAVENOUS | Status: AC
  Administered 2015-07-06: 2 g via INTRAVENOUS
  Filled 2015-07-06: qty 50

## 2015-07-06 MED ORDER — POTASSIUM CHLORIDE CRYS ER 20 MEQ PO TBCR
40.0000 meq | EXTENDED_RELEASE_TABLET | Freq: Two times a day (BID) | ORAL | Status: AC
  Administered 2015-07-06 (×2): 40 meq via ORAL
  Filled 2015-07-06 (×2): qty 2

## 2015-07-06 NOTE — Progress Notes (Signed)
*  Preliminary Results* Left lower extremity venous duplex completed. Study was technically limited due to edema. Visualized veins of the left lower extremity are negative for deep vein thrombosis. There is no evidence of left Baker's cyst.  07/06/2015 11:05 AM  Maudry Mayhew, RVT, RDCS, RDMS

## 2015-07-06 NOTE — Progress Notes (Signed)
[facility] PROGRESS NOTE   [name] [id number] DOB: Oct 11, 1940 DOA: 07/02/2015 PCP: [name]., DO  HPI/Subjective: Feels much better, on room air, seen with her husband at bedside. Still complains about left calf pain, Dopplers negative for clots.  Assessment/Plan: Principal Problem:   Acute respiratory failure with hypoxia (HCC) Active Problems:   Breast cancer, right breast (HCC)   Obstructive sleep apnea   RESTLESS LEGS SYNDROME   Hearing loss - has hearing aides, follow by audiologist   Type 2 diabetes mellitus with diabetic neuropathy ([place])   Acute respiratory failure ([place])    Acute respiratory failure Patient came into the hospital with shortness of breath and oxygen saturation in the 80s. Patient required supplemental oxygen to keep oxygen saturation above 90%. This is likely secondary to acute congestive heart failure. On room air today low 90s. We'll try to ambulate.  Acute congestive heart failure Presented with DOE, orthopnea and lower extremity edema. 2-D echo normal LVEF of 60-65% with grade 1 diastolic dysfunction, the BNP is not elevated. Treated with Lasix, follow patient clinically. Restrict salt and fluid intake. On 60 mg of Lasix 3 times a day. Continue diuresis.  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 Hemoglobin A1c was 8.1 on 04/18/2015  Restless leg syndrome Continue home medications.  History of right-sided breast cancer Right-sided breast cancer with lesion seen again in the CT scan but no change since last study.   Code Status: Full Code Family Communication: Plan discussed with the patient. Disposition Plan: Remains inpatient Diet: Diet heart healthy/carb modified Room service appropriate?: Yes; Fluid consistency:: Thin; Fluid restriction:: 1200 mL Fluid  Consultants:  None   Procedures:  None   Antibiotics:  None  (indicate start date,  and stop date if known)   Objective: Filed Vitals:   07/05/15 2057 07/06/15 1052  BP: 125/59 114/53  Pulse: 88   Temp: 98.5 F (36.9 C)   Resp: 18     Intake/Output Summary (Last 24 hours) at 07/06/15 1148 Last data filed at 07/06/15 0900  Gross per 24 hour  Intake    480 ml  Output    800 ml  Net   -320 ml   Filed Weights   07/04/15 0243 07/05/15 0418 07/06/15 0346  Weight: 90.583 kg (199 lb 11.2 oz) 89.812 kg (198 lb) 88.678 kg (195 lb 8 oz)    Exam: General: Alert and awake, oriented x3, not in any acute distress. HEENT: anicteric sclera, pupils reactive to light and accommodation, EOMI CVS: S1-S2 clear, no murmur rubs or gallops Chest: clear to auscultation bilaterally, no wheezing, rales or rhonchi Abdomen: soft nontender, nondistended, normal bowel sounds, no organomegaly Extremities: no cyanosis, clubbing or edema noted bilaterally Neuro: Cranial nerves II-XII intact, no focal neurological deficits  Data Reviewed: Basic Metabolic Panel:  Recent Labs Lab 07/02/15 1417 07/03/15 0010 07/03/15 0455 07/04/15 0500 07/05/15 0420 07/06/15 0532  NA 136  --  134* 135 131* 133*  K 3.2*  --  3.3* 3.9 3.5 3.6  CL 93*  --  92* 94* 86* 87*  CO2 30  --  30 32 31 33*  GLUCOSE 171*  --  244* 247* 219* 241*  BUN 17  --  13 18 20  22*  CREATININE 0.69 0.60 0.53 0.60 0.66 0.66  CALCIUM 9.4  --  8.6* 8.9 9.2 9.0  MG  --  1.6*  --  1.9  --  1.8  PHOS  --   --   --   --   --  3.6   Liver Function Tests:  Recent Labs Lab 07/03/15 0010 07/06/15 0532  AST 18  --   ALT 16  --   ALKPHOS 84  --   BILITOT 0.8  --   PROT 7.9  --   ALBUMIN 3.5 2.9*   No results for input(s): LIPASE, AMYLASE in the last 168 hours. No results for input(s): AMMONIA in the last 168 hours. CBC:  Recent Labs Lab 07/02/15 1417 07/03/15 0010 07/03/15 0455  WBC 14.2* 16.5* 13.9*  NEUTROABS 11.2*  --  11.2*  HGB 13.4 14.0 13.9  HCT 39.8 41.0 41.7  MCV 90.2 90.7 89.7  PLT 296 321 252    Cardiac Enzymes:  Recent Labs Lab 07/03/15 0010 07/03/15 0455 07/03/15 1151  TROPONINI <0.03 <0.03 <0.03   BNP (last 3 results)  Recent Labs  07/02/15 1827  BNP 20.4    ProBNP (last 3 results) No results for input(s): PROBNP in the last 8760 hours.  CBG:  Recent Labs Lab 07/05/15 0545 07/05/15 1151 07/05/15 1643 07/05/15 2123 07/06/15 0638  GLUCAP 190* 279* 211* 246* 246*    Micro No results found for this or any previous visit (from the past 240 hour(s)).   Studies: No results found.  Scheduled Meds: . amLODipine  10 mg Oral Daily  . anastrozole  1 mg Oral Daily  . antiseptic oral rinse  7 mL Mouth Rinse BID  . aspirin EC  325 mg Oral Daily  . enoxaparin (LOVENOX) injection  40 mg Subcutaneous Daily  . furosemide  60 mg Intravenous 3 times per day  . insulin aspart  0-9 Units Subcutaneous TID WC  . linagliptin  5 mg Oral Daily  . pramipexole  1 mg Oral BID  . sodium chloride flush  3 mL Intravenous Q12H   Continuous Infusions:      Time spent: 35 minutes    [facility] A  [facility] Pager [phone] If 7PM-7AM, please contact night-coverage at [email or web address], password [facility] 07/06/2015, 11:48 AM  LOS: 4 days

## 2015-07-07 LAB — RENAL FUNCTION PANEL
ALBUMIN: 3.2 g/dL — AB (ref 3.5–5.0)
Anion gap: 14 (ref 5–15)
BUN: 24 mg/dL — ABNORMAL HIGH (ref 6–20)
CHLORIDE: 83 mmol/L — AB (ref 101–111)
CO2: 33 mmol/L — AB (ref 22–32)
CREATININE: 0.71 mg/dL (ref 0.44–1.00)
Calcium: 9.4 mg/dL (ref 8.9–10.3)
GFR calc non Af Amer: >60 mL/min (ref >60)
Glucose, Bld: 259 mg/dL — ABNORMAL HIGH (ref 65–99)
POTASSIUM: 3.4 mmol/L — AB (ref 3.5–5.1)
Phosphorus: 4.4 mg/dL (ref 2.5–4.6)
SODIUM: 130 mmol/L — AB (ref 135–145)

## 2015-07-07 LAB — GLUCOSE, CAPILLARY
GLUCOSE-CAPILLARY: 280 mg/dL — AB (ref 65–99)
GLUCOSE-CAPILLARY: 257 mg/dL — AB (ref 65–99)

## 2015-07-07 LAB — MAGNESIUM: MAGNESIUM: 1.9 mg/dL (ref 1.7–2.4)

## 2015-07-07 MED ORDER — FUROSEMIDE 40 MG PO TABS: 40.0000 mg | ORAL_TABLET | Freq: Every day | ORAL | Status: DC

## 2015-07-07 MED ORDER — CARVEDILOL 3.125 MG PO TABS: 3.1250 mg | ORAL_TABLET | Freq: Two times a day (BID) | ORAL | Status: DC

## 2015-07-07 MED ORDER — POTASSIUM CHLORIDE CRYS ER 20 MEQ PO TBCR
40.0000 meq | EXTENDED_RELEASE_TABLET | Freq: Two times a day (BID) | ORAL | Status: DC
  Administered 2015-07-07: 40 meq via ORAL
  Filled 2015-07-07: qty 2

## 2015-07-07 MED ORDER — LISINOPRIL 10 MG PO TABS
10.0000 mg | ORAL_TABLET | Freq: Every day | ORAL | Status: DC
Start: 2015-07-07 — End: 2015-08-01

## 2015-07-07 NOTE — Discharge Summary (Signed)
Physician Discharge Summary  [name] [id number] DOB: Jul 21, 1940 DOA: 07/02/2015  PCP: Vanessa Benton R., DO  Admit date: 07/02/2015 Discharge date: 07/07/2015  Time spent: 40 minutes  Recommendations for Outpatient Follow-up:  1. Follow-up with primary care physician within one week. 2. Started on Lasix, check BMP in 1 week.reassess the need for potassium and higher Lasix dose. 3. Started on Coreg, lisinopril dose decreased and HCTZ discontinued   Discharge Diagnoses:  Principal Problem:   Acute respiratory failure with hypoxia ([place]) Active Problems:   Breast cancer, right breast ([place])   Obstructive sleep apnea   RESTLESS LEGS SYNDROME   Hearing loss - has hearing aides, follow by audiologist   Type 2 diabetes mellitus with diabetic neuropathy ([place])   Acute respiratory failure ([place])   Discharge Condition: stable  Diet recommendation: heart healthy  Filed Weights   07/05/15 0418 07/06/15 0346 07/07/15 0359  Weight: 89.812 kg (198 lb) 88.678 kg (195 lb 8 oz) 88.95 kg (196 lb 1.6 oz)    History of present illness:  [name] is a 75 y.o. female with history of breast cancer in remission, diabetes mellitus type 2, hypertension was referred to the ER after patient was having increasing shortness of breath on exertion with lower extremity edema. Patient has noticed increasing shortness of breath over the last 1 week which is present on exertion also on lying flat. Patient also has noticed increasing swelling of the lower extremities. CT angiogram of the chest in the ER was unremarkable. Patient is having significant lower extremity edema and will be admitted for further management of shortness of breath most likely from fluid overload. Denies any chest pain fever chills productive cough nausea vomiting. CT scan did show abnormality in the gallbladder but patient denies any abdominal pain.   Hospital Course:   Acute respiratory failure Patient came into the hospital with  shortness of breath and oxygen saturation in the 80s. Patient required supplemental oxygen to keep oxygen saturation above 90%. Has positive d-dimer is, CT angiography was negative for PE. This is likely secondary to acute congestive heart failure. On discharge ambulated with resting saturation 95% and 91% after recovery from walk in the hallway.  Acute diastolic congestive heart failure Presented with DOE, orthopnea and lower extremity edema. 2-D echo normal LVEF of 60-65% with grade 1 diastolic dysfunction, the BNP is not elevated. Treated with IV Lasix, fluidand salt restriction. She required and usually high dose of Lasix she was getting 60 mg 3 times a day, weight is down 5 pounds on discharge. Medications adjusted, started on Coreg, lisinopril dose decreased and HCTZ discontinued. Discharge and 40 mg of Lasix daily, please check BMP in 1 week, evaluate the need to increase Lasix.  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 Hemoglobin A1c was 8.1 on 04/18/2015, continue home regimen  Restless leg syndrome Continue home medications. Mentioned left calf pain,Doppler done showed no evidence of DVT.  History of right-sided breast cancer Right-sided breast cancer with lesion seen again in the CT scan but no change since last study.   Procedures:  None  Consultations:  None  Discharge Exam: Filed Vitals:   07/07/15 0030 07/07/15 0359  BP:  128/67  Pulse: 83 83  Temp:  98.6 F (37 C)  Resp: 14 18   General: Alert and awake, oriented x3, not in any acute distress. HEENT: anicteric sclera, pupils reactive to light and accommodation, EOMI CVS: S1-S2 clear, no murmur  rubs or gallops Chest: clear to auscultation bilaterally, no wheezing, rales or rhonchi Abdomen: soft nontender, nondistended, normal bowel sounds, no organomegaly Extremities: no cyanosis, clubbing or edema noted bilaterally Neuro: Cranial  nerves II-XII intact, no focal neurological deficits   Discharge Instructions    Current Discharge Medication List    START taking these medications   Details  carvedilol (COREG) 3.125 MG tablet Take 1 tablet (3.125 mg total) by mouth 2 (two) times daily with a meal. Qty: 60 tablet, Refills: 0    furosemide (LASIX) 40 MG tablet Take 1 tablet (40 mg total) by mouth daily. Qty: 30 tablet, Refills: 0    lisinopril (PRINIVIL) 10 MG tablet Take 1 tablet (10 mg total) by mouth daily. Qty: 30 tablet, Refills: 0      CONTINUE these medications which have NOT CHANGED   Details  anastrozole (ARIMIDEX) 1 MG tablet TAKE ONE TABLET BY MOUTH ONCE DAILY Qty: 30 tablet, Refills: 4    diphenhydramine-acetaminophen (TYLENOL PM) 25-500 MG TABS Take 1 tablet by mouth at bedtime as needed (for pain /sleep).    HYDROcodone-acetaminophen (NORCO) 7.5-325 MG per tablet Take 1-2 tablets by mouth every 4 (four) hours as needed for moderate pain. Qty: 90 tablet, Refills: 0    metFORMIN (GLUCOPHAGE) 1000 MG tablet TAKE ONE TABLET BY MOUTH TWICE DAILY Qty: 180 tablet, Refills: 0    methocarbamol (ROBAXIN) 500 MG tablet Take 500 mg by mouth every 6 (six) hours as needed for muscle spasms.    pramipexole (MIRAPEX) 1 MG tablet TAKE ONE TABLET BY MOUTH TWICE DAILY Qty: 180 tablet, Refills: 0    sitaGLIPtin (JANUVIA) 100 MG tablet Take 1 tablet (100 mg total) by mouth daily. Qty: 90 tablet, Refills: 1      STOP taking these medications     amLODipine (NORVASC) 10 MG tablet      lisinopril-hydrochlorothiazide (PRINZIDE,ZESTORETIC) 20-25 MG tablet        Allergies  Allergen Reactions  . Sulfonamide Derivatives Swelling    Lips Swelling  . Other     BLOOD PRODUCT REFUSAL   Follow-up Information    Follow up with [facility].   Why:  They will do your home health care at your home   Contact information:   [address] [place] [address] [phone]        Follow up with Vanessa Benton R., DO In 1 week.   Specialty:  Family Medicine   Contact information:   [place] [address] [phone]        The results of significant diagnostics from this hospitalization (including imaging, microbiology, ancillary and laboratory) are listed below for reference.    Significant Diagnostic Studies: Dg Chest 2 View  07/02/2015  CLINICAL DATA:  Shortness of breath for 1 day. Several day history of chest pain on the left EXAM: CHEST  2 VIEW COMPARISON:  Chest radiograph August 08, 2014; chest CT August 09, 2014 FINDINGS: There is stable elevation of the left hemidiaphragm with atelectatic change in the left base. There is no edema or consolidation. Heart is upper normal in size with pulmonary vascularity within normal limits. There is atherosclerotic calcification in the aorta. No demonstrable adenopathy. There is degenerative change in the thoracic spine. No pneumothorax. IMPRESSION: Elevation left hemidiaphragm with atelectatic change left base. No edema or consolidation. Stable cardiac silhouette. Electronically Signed   By: Lowella Grip III M.D.   On: 07/02/2015 14:54   Ct Angio Chest Pe  W/cm &/or Wo Cm  07/02/2015  CLINICAL DATA:  Hypoxia, shortness of breath, and back pain for several days. EXAM: CT ANGIOGRAPHY CHEST WITH CONTRAST TECHNIQUE: Multidetector CT imaging of the chest was performed using the standard protocol during bolus administration of intravenous contrast. Multiplanar CT image reconstructions and MIPs were obtained to evaluate the vascular anatomy. CONTRAST:  61mL OMNIPAQUE IOHEXOL 350 MG/ML SOLN COMPARISON:  08/09/2014 FINDINGS: Technically adequate study with good opacification of the central and segmental pulmonary arteries. No focal filling defects demonstrated. No evidence of significant pulmonary embolus. Pulmonary arteries are somewhat prominent suggesting possible arterial hypertension. Normal heart size. Normal  caliber thoracic aorta with calcification. The esophagus is decompressed. No significant lymphadenopathy in the chest. Evaluation of lungs is limited due to respiratory motion artifact. There is atelectasis in the lung bases, greater on the left, with elevation of the left hemidiaphragm. No pleural effusions. No pneumothorax. Airways appear patent. There is a circumscribed nodular lesion in the right breast measuring 3.2 cm diameter. Density of the lesion is consistent with fluid and fat. This may represent a postoperative cavity or area fat necrosis. Appearance is unchanged since previous study. Included portions of the upper abdominal organs demonstrate a distended gallbladder with increased density inside the gallbladder suggesting stones or sludge. Degenerative changes throughout the spine. Review of the MIP images confirms the above findings. IMPRESSION: No evidence of significant pulmonary embolus. Elevation of left hemidiaphragm. Atelectasis in the lung bases. Incidental note of a distended gallbladder with increased density suggesting stones or sludge. Electronically Signed   By: Lucienne Capers M.D.   On: 07/02/2015 21:39    Microbiology: No results found for this or any previous visit (from the past 240 hour(s)).   Labs: Basic Metabolic Panel:  Recent Labs Lab 07/03/15 0010 07/03/15 0455 07/04/15 0500 07/05/15 0420 07/06/15 0532 07/07/15 0319  NA  --  134* 135 131* 133* 130*  K  --  3.3* 3.9 3.5 3.6 3.4*  CL  --  92* 94* 86* 87* 83*  CO2  --  30 32 31 33* 33*  GLUCOSE  --  244* 247* 219* 241* 259*  BUN  --  13 18 20  22* 24*  CREATININE 0.60 0.53 0.60 0.66 0.66 0.71  CALCIUM  --  8.6* 8.9 9.2 9.0 9.4  MG 1.6*  --  1.9  --  1.8 1.9  PHOS  --   --   --   --  3.6 4.4   Liver Function Tests:  Recent Labs Lab 07/03/15 0010 07/06/15 0532 07/07/15 0319  AST 18  --   --   ALT 16  --   --   ALKPHOS 84  --   --   BILITOT 0.8  --   --   PROT 7.9  --   --   ALBUMIN 3.5 2.9* 3.2*    No results for input(s): LIPASE, AMYLASE in the last 168 hours. No results for input(s): AMMONIA in the last 168 hours. CBC:  Recent Labs Lab 07/02/15 1417 07/03/15 0010 07/03/15 0455  WBC 14.2* 16.5* 13.9*  NEUTROABS 11.2*  --  11.2*  HGB 13.4 14.0 13.9  HCT 39.8 41.0 41.7  MCV 90.2 90.7 89.7  PLT 296 321 252   Cardiac Enzymes:  Recent Labs Lab 07/03/15 0010 07/03/15 0455 07/03/15 1151  TROPONINI <0.03 <0.03 <0.03   BNP: BNP (last 3 results)  Recent Labs  07/02/15 1827  BNP 20.4    ProBNP (last 3 results) No results for input(s):  PROBNP in the last 8760 hours.  CBG:  Recent Labs Lab 07/06/15 1155 07/06/15 1644 07/06/15 2211 07/07/15 0640 07/07/15 1229  GLUCAP 238* 282* 223* 280* 257*       Signed:  Verlee Monte A MD.  [facility] 07/07/2015, 1:00 PM

## 2015-07-07 NOTE — Progress Notes (Signed)
Ambulated in the hallway using cane, at Ra SPO2  95% during ambulation recovered 91% RA.

## 2015-07-08 DIAGNOSIS — I10 Essential (primary) hypertension: Secondary | ICD-10-CM | POA: Diagnosis not present

## 2015-07-08 DIAGNOSIS — G4733 Obstructive sleep apnea (adult) (pediatric): Secondary | ICD-10-CM | POA: Diagnosis not present

## 2015-07-08 DIAGNOSIS — Z853 Personal history of malignant neoplasm of breast: Secondary | ICD-10-CM | POA: Diagnosis not present

## 2015-07-08 DIAGNOSIS — G2581 Restless legs syndrome: Secondary | ICD-10-CM | POA: Diagnosis not present

## 2015-07-08 DIAGNOSIS — J9601 Acute respiratory failure with hypoxia: Secondary | ICD-10-CM | POA: Diagnosis not present

## 2015-07-08 DIAGNOSIS — I5031 Acute diastolic (congestive) heart failure: Secondary | ICD-10-CM | POA: Diagnosis not present

## 2015-07-08 DIAGNOSIS — E1143 Type 2 diabetes mellitus with diabetic autonomic (poly)neuropathy: Secondary | ICD-10-CM | POA: Diagnosis not present

## 2015-07-09 ENCOUNTER — Telehealth: Payer: Self-pay

## 2015-07-09 DIAGNOSIS — I10 Essential (primary) hypertension: Secondary | ICD-10-CM | POA: Diagnosis not present

## 2015-07-09 DIAGNOSIS — J9601 Acute respiratory failure with hypoxia: Secondary | ICD-10-CM | POA: Diagnosis not present

## 2015-07-09 DIAGNOSIS — Z853 Personal history of malignant neoplasm of breast: Secondary | ICD-10-CM | POA: Diagnosis not present

## 2015-07-09 DIAGNOSIS — I5031 Acute diastolic (congestive) heart failure: Secondary | ICD-10-CM | POA: Diagnosis not present

## 2015-07-09 DIAGNOSIS — G2581 Restless legs syndrome: Secondary | ICD-10-CM | POA: Diagnosis not present

## 2015-07-09 DIAGNOSIS — G4733 Obstructive sleep apnea (adult) (pediatric): Secondary | ICD-10-CM | POA: Diagnosis not present

## 2015-07-09 DIAGNOSIS — E1143 Type 2 diabetes mellitus with diabetic autonomic (poly)neuropathy: Secondary | ICD-10-CM | POA: Diagnosis not present

## 2015-07-09 NOTE — Telephone Encounter (Signed)
First TCM attempt

## 2015-07-10 DIAGNOSIS — I5031 Acute diastolic (congestive) heart failure: Secondary | ICD-10-CM | POA: Diagnosis not present

## 2015-07-10 DIAGNOSIS — G2581 Restless legs syndrome: Secondary | ICD-10-CM | POA: Diagnosis not present

## 2015-07-10 DIAGNOSIS — I10 Essential (primary) hypertension: Secondary | ICD-10-CM | POA: Diagnosis not present

## 2015-07-10 DIAGNOSIS — G4733 Obstructive sleep apnea (adult) (pediatric): Secondary | ICD-10-CM | POA: Diagnosis not present

## 2015-07-10 DIAGNOSIS — E1143 Type 2 diabetes mellitus with diabetic autonomic (poly)neuropathy: Secondary | ICD-10-CM | POA: Diagnosis not present

## 2015-07-10 DIAGNOSIS — Z853 Personal history of malignant neoplasm of breast: Secondary | ICD-10-CM | POA: Diagnosis not present

## 2015-07-10 DIAGNOSIS — J9601 Acute respiratory failure with hypoxia: Secondary | ICD-10-CM | POA: Diagnosis not present

## 2015-07-10 NOTE — Telephone Encounter (Signed)
Transition Care Management Follow-up Telephone Call  How have you been since you were released from the hospital? I think im doing better    Do you understand why you were in the hospital? yes   Do you understand the discharge instrcutions? yes  Items Reviewed:  Medications reviewed: yes  Allergies reviewed: yes  Dietary changes reviewed: yes  Referrals reviewed: yes   Functional Questionnaire:   Activities of Daily Living (ADLs):   She states they are independent in the following: ambulation, bathing and hygiene, feeding, continence, grooming, toileting and dressing States they require assistance with the following: none   Any transportation issues/concerns?: no   Any patient concerns? no   Confirmed importance and date/time of follow-up visits scheduled: yes   Confirmed with patient if condition begins to worsen call PCP or go to the ER.  Patient was given the Call-a-Nurse line [phone]: yes  Pt has an appt with Dr Maudie Mercury on Monday 06/20/15 at 1:15

## 2015-07-11 DIAGNOSIS — I5031 Acute diastolic (congestive) heart failure: Secondary | ICD-10-CM | POA: Diagnosis not present

## 2015-07-11 DIAGNOSIS — J9601 Acute respiratory failure with hypoxia: Secondary | ICD-10-CM | POA: Diagnosis not present

## 2015-07-11 DIAGNOSIS — G2581 Restless legs syndrome: Secondary | ICD-10-CM | POA: Diagnosis not present

## 2015-07-11 DIAGNOSIS — I10 Essential (primary) hypertension: Secondary | ICD-10-CM | POA: Diagnosis not present

## 2015-07-11 DIAGNOSIS — E1143 Type 2 diabetes mellitus with diabetic autonomic (poly)neuropathy: Secondary | ICD-10-CM | POA: Diagnosis not present

## 2015-07-11 DIAGNOSIS — G4733 Obstructive sleep apnea (adult) (pediatric): Secondary | ICD-10-CM | POA: Diagnosis not present

## 2015-07-11 DIAGNOSIS — Z853 Personal history of malignant neoplasm of breast: Secondary | ICD-10-CM | POA: Diagnosis not present

## 2015-07-12 DIAGNOSIS — I10 Essential (primary) hypertension: Secondary | ICD-10-CM | POA: Diagnosis not present

## 2015-07-12 DIAGNOSIS — E1143 Type 2 diabetes mellitus with diabetic autonomic (poly)neuropathy: Secondary | ICD-10-CM | POA: Diagnosis not present

## 2015-07-12 DIAGNOSIS — G2581 Restless legs syndrome: Secondary | ICD-10-CM | POA: Diagnosis not present

## 2015-07-12 DIAGNOSIS — J9601 Acute respiratory failure with hypoxia: Secondary | ICD-10-CM | POA: Diagnosis not present

## 2015-07-12 DIAGNOSIS — I5031 Acute diastolic (congestive) heart failure: Secondary | ICD-10-CM | POA: Diagnosis not present

## 2015-07-12 DIAGNOSIS — Z853 Personal history of malignant neoplasm of breast: Secondary | ICD-10-CM | POA: Diagnosis not present

## 2015-07-12 DIAGNOSIS — G4733 Obstructive sleep apnea (adult) (pediatric): Secondary | ICD-10-CM | POA: Diagnosis not present

## 2015-07-15 ENCOUNTER — Ambulatory Visit (INDEPENDENT_AMBULATORY_CARE_PROVIDER_SITE_OTHER): Payer: Commercial Managed Care - HMO | Admitting: Family Medicine

## 2015-07-15 ENCOUNTER — Encounter: Payer: Self-pay | Admitting: Family Medicine

## 2015-07-15 VITALS — BP 128/82 | HR 79 | Temp 97.7°F | Ht 61.0 in | Wt 198.0 lb

## 2015-07-15 DIAGNOSIS — Z853 Personal history of malignant neoplasm of breast: Secondary | ICD-10-CM | POA: Diagnosis not present

## 2015-07-15 DIAGNOSIS — J9601 Acute respiratory failure with hypoxia: Secondary | ICD-10-CM

## 2015-07-15 DIAGNOSIS — I5031 Acute diastolic (congestive) heart failure: Secondary | ICD-10-CM

## 2015-07-15 DIAGNOSIS — E1143 Type 2 diabetes mellitus with diabetic autonomic (poly)neuropathy: Secondary | ICD-10-CM | POA: Diagnosis not present

## 2015-07-15 DIAGNOSIS — G4733 Obstructive sleep apnea (adult) (pediatric): Secondary | ICD-10-CM

## 2015-07-15 DIAGNOSIS — I10 Essential (primary) hypertension: Secondary | ICD-10-CM | POA: Diagnosis not present

## 2015-07-15 DIAGNOSIS — R0600 Dyspnea, unspecified: Secondary | ICD-10-CM | POA: Diagnosis not present

## 2015-07-15 DIAGNOSIS — G2581 Restless legs syndrome: Secondary | ICD-10-CM | POA: Diagnosis not present

## 2015-07-15 DIAGNOSIS — E1140 Type 2 diabetes mellitus with diabetic neuropathy, unspecified: Secondary | ICD-10-CM

## 2015-07-15 LAB — BASIC METABOLIC PANEL
BUN: 15 mg/dL (ref 6–23)
CO2: 31 mEq/L (ref 19–32)
Calcium: 9.5 mg/dL (ref 8.4–10.5)
Chloride: 94 mEq/L — ABNORMAL LOW (ref 96–112)
Creatinine, Ser: 0.58 mg/dL (ref 0.40–1.20)
GFR: 107.34 mL/min (ref >60.00)
Glucose, Bld: 167 mg/dL — ABNORMAL HIGH (ref 70–99)
POTASSIUM: 4.1 meq/L (ref 3.5–5.1)
SODIUM: 135 meq/L (ref 135–145)

## 2015-07-15 NOTE — Patient Instructions (Signed)
Before you leave: -Recheck blood pressure -New glucose monitor -Labs -Schedule follow-up in 3 months   Please call your insurance company and find out what diabetes medications they cover. Please let JoAnne this week so that we can decide on another medication or insulin for your diabetes.  -We have ordered labs or studies at this visit. It can take up to 1-2 weeks for results and processing. We will contact you with instructions IF your results are abnormal. Normal results will be released to your [facility]. If you have not heard from [place] or can not find your results in [facility] in 2 weeks please contact our office.  FOR YOUR DIABETES:  []  Eat a healthy low carb diet (avoid sweets, sweet drinks, white starchy foods, breads, potatoes, rice, etc.) and ensure 3 small meals daily.  []  Get AT LEAST 150 minutes of cardiovascular exercise per week - 30 minutes per day is best of sustained sweaty exercise.  []  Take all of your medications every day as directed by your doctor. Call your doctor immediately if you have any questions about your medications or are running low.  []  Check your blood sugar often and when you feel unwell and keep a log to bring to all health appointments. FASTING: before you eat anything in the morning POSTPRANDIAL: 1-2 hours after a meal  []  If any low blood sugars < 70, eat a snack and call your doctor immediately.  []  See an eye doctor every year and fax your diabetic eye exam to our office.  Fax: [phone]  []  Take good care of your feet and keep them soft and callus free. Check your feet daily and wear comfortable shoes. See your doctor immediately if you have any cuts, calluses or wounds on your feet.

## 2015-07-15 NOTE — Progress Notes (Signed)
Pre visit review using our clinic review tool, if applicable. No additional management support is needed unless otherwise documented below in the visit note. 

## 2015-07-15 NOTE — Progress Notes (Addendum)
HPI:  [name] is a 75 year old with a past medical history significant for hypertension, sleep apnea, diabetes, anxiety and breast cancer here for a transition of care visit after a recent hospitalization. Please see phone following recent hospitalization. She was seen in our office on 07/02/15 for shortness of breath, hypoxia and worsening lower extremity swelling and was sent to the emergency room for further evaluation. She was hospitalized from 2/14-2/19/17 for acute respiratory failure with hypoxia. She underwent an extensive evaluation including an echo cardiogram of the heart which showed mild diastolic dysfunction but a normal EF, labs, chest x-ray, lower extremity ultrasound, cardiac monitoring and a chest CT angio. Atherosclerosis of the aorta and gallbladder disease were incidentally noted. She was diuresed with high dose Lasix. She was started on Lasix to continue at home and her hydrochlorothiazide was discontinued. Her diabetes has been out of control, however she was reluctant to make any changes in her medications as she was waiting on insurance changes. She was on a sliding scale insulin in the hospital. She has a history obstructive sleep apnea, but did not tolerate CPAP and had opted not to treat. She reports she is doing better and that most of the breathing issues have resolved, however she does have still some mild dyspnea at times. Swelling in her legs has improved. She denies chest pain, orthopnea, fevers or palpitations. She is anxious about the cost of medications to treat her diabetes. She eats quite poorly, and likes sweets. She reports her glucose meter is old and is not working.  ROS: See pertinent positives and negatives per HPI.  Past Medical History  Diagnosis Date  . Hypertension   . Arthritis   . Hearing loss   . History of radiation therapy 04/29/10-05/05/10    right breast 34Gy/47fx  . Actinic keratosis 07/18/2008    Qualifier: Diagnosis of  By: Arnoldo Morale MD, Balinda Quails   .  Closed fracture of one or more phalanges of foot 03/29/2008    Qualifier: Diagnosis of  By: Arnoldo Morale MD, Balinda Quails   . No blood products ([employer])  09/11/2013  . RESTLESS LEGS SYNDROME 05/05/2007    Qualifier: Diagnosis of  By: Gwenette Greet MD, Armando Reichert   . Radiation 05/05/2010    Right Breast mammosite  . Sleep apnea   . History of environmental allergies   . Diabetes mellitus     type 2  . Anxiety     occasional  . History of kidney stones 20 years ago  . Falls frequently     "can not pick left leg up"  . Cancer [facility]) dx'd 2012    right breast --xrt    Past Surgical History  Procedure Laterality Date  . Flex signoidoscopy    . Incision and drainage perirectal abscess    . Tubal ligation  1979  . Breast surgery  2012    rt breast lumpectomy  . Appendectomy  age 3  . Joint replacement Right 2006    knee  . 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 Cancer Sister     breast  . Heart disease Paternal Grandfather   . Diabetes Other   . Obesity Other   . Heart disease Brother     Valve    Social History   Social History  .  Marital Status: Married    Spouse Name: N/A  . Number of Children: 3  . Years of Education: N/A   Social History Main Topics  . Smoking status: Never Smoker   . Smokeless tobacco: Never Used  . Alcohol Use: No  . Drug Use: No  . Sexual Activity: Yes   Other Topics Concern  . None   Social History Narrative         Home Situation: lives with husband and granddaughter      Spiritual Beliefs: Jehovah Witness - no blood products                    Current outpatient prescriptions:  .  anastrozole (ARIMIDEX) 1 MG tablet, TAKE ONE TABLET BY MOUTH ONCE DAILY, Disp: 30 tablet, Rfl: 4 .  carvedilol (COREG) 3.125 MG tablet, Take 1 tablet (3.125 mg total) by  mouth 2 (two) times daily with a meal., Disp: 60 tablet, Rfl: 0 .  diphenhydramine-acetaminophen (TYLENOL PM) 25-500 MG TABS, Take 1 tablet by mouth at bedtime as needed (for pain /sleep)., Disp: , Rfl:  .  furosemide (LASIX) 40 MG tablet, Take 1 tablet (40 mg total) by mouth daily., Disp: 30 tablet, Rfl: 0 .  HYDROcodone-acetaminophen (NORCO) 7.5-325 MG per tablet, Take 1-2 tablets by mouth every 4 (four) hours as needed for moderate pain., Disp: 90 tablet, Rfl: 0 .  lisinopril (PRINIVIL) 10 MG tablet, Take 1 tablet (10 mg total) by mouth daily., Disp: 30 tablet, Rfl: 0 .  metFORMIN (GLUCOPHAGE) 1000 MG tablet, TAKE ONE TABLET BY MOUTH TWICE DAILY, Disp: 180 tablet, Rfl: 0 .  methocarbamol (ROBAXIN) 500 MG tablet, Take 500 mg by mouth every 6 (six) hours as needed for muscle spasms., Disp: , Rfl:  .  pramipexole (MIRAPEX) 1 MG tablet, TAKE ONE TABLET BY MOUTH TWICE DAILY (Patient taking differently: Take 1 mg by mouth daily at bedtime.), Disp: 180 tablet, Rfl: 0 .  sitaGLIPtin (JANUVIA) 100 MG tablet, Take 1 tablet (100 mg total) by mouth daily., Disp: 90 tablet, Rfl: 1  EXAM:  Filed Vitals:   07/15/15 1323  BP: 128/82  Pulse: 79  Temp: 97.7 F (36.5 C)    Body mass index is 37.43 kg/(m^2).  GENERAL: vitals reviewed and listed above, alert, oriented, appears well hydrated and in no acute distress  HEENT: atraumatic, conjunttiva clear, no obvious abnormalities on inspection of external nose and ears  NECK: no obvious masses on inspection  LUNGS: clear to auscultation bilaterally, no wheezes, rales or rhonchi, good air movement  CV: HRRR, trace bilateral ankle edema  MS: moves all extremities without noticeable abnormality  PSYCH: pleasant and cooperative, no obvious depression or anxiety  ASSESSMENT AND PLAN:  Discussed the following assessment and plan:  Dyspnea - Plan: Ambulatory referral to Pulmonology Acute respiratory failure with hypoxia (HCC) Obstructive sleep apnea -  Plan: Ambulatory referral to Pulmonology -She is curious about an underlying undiagnosed asthma or COPD, also after a lengthy discussion she is interested in possibly attempting treatment for OSA again. Opted for a referral to pulmonology to address these issues. -Continue Lasix at the same dose for now, we'll check a BMP today  Type 2 diabetes mellitus with diabetic neuropathy, without long-term current use of insulin ([place]) -discussed various options for treatment at length and stressed the importance of dietary changes particularly given her concerns about medications -Discussed adding another oral agent versus insulin -She has opted to contact her insurance company regarding coverage for various options  and she is to let us know this week about her plans -Assistant advised to help her with a new meter and she was advised to monitor her sugar at home -Follow up as scheduled  Acute diastolic heart failure (HCC) -Mild on echo, but recent severe symptoms c/s fluid overload that resolved with diuresis, continue current medications including lasix at home  Essential hypertension - Plan: Basic metabolic panel -Mildly elevated on arrival but improved on recheck -Continue current medications   -Patient advised to return or notify a doctor immediately if symptoms worsen or persist or new concerns arise.  Patient Instructions   Before you leave: -Recheck blood pressure -New glucose monitor -Labs -Schedule follow-up in 3 months   Please call your insurance company and find out what diabetes medications they cover. Please let JoAnne this week so that we can decide on another medication or insulin for your diabetes.  -We have ordered labs or studies at this visit. It can take up to 1-2 weeks for results and processing. We will contact you with instructions IF your results are abnormal. Normal results will be released to your [facility]. If you have not heard from [place] or can not find your results in  [facility] in 2 weeks please contact our office.  FOR YOUR DIABETES:  []  Eat a healthy low carb diet (avoid sweets, sweet drinks, white starchy foods, breads, potatoes, rice, etc.) and ensure 3 small meals daily.  []  Get AT LEAST 150 minutes of cardiovascular exercise per week - 30 minutes per day is best of sustained sweaty exercise.  []  Take all of your medications every day as directed by your doctor. Call your doctor immediately if you have any questions about your medications or are running low.  []  Check your blood sugar often and when you feel unwell and keep a log to bring to all health appointments. FASTING: before you eat anything in the morning POSTPRANDIAL: 1-2 hours after a meal  []  If any low blood sugars < 70, eat a snack and call your doctor immediately.  []  See an eye doctor every year and fax your diabetic eye exam to our office.  Fax: [phone]  []  Take good care of your feet and keep them soft and callus free. Check your feet daily and wear comfortable shoes. See your doctor immediately if you have any cuts, calluses or wounds on your feet.             Colin Benton R.

## 2015-07-16 ENCOUNTER — Other Ambulatory Visit: Payer: Self-pay

## 2015-07-16 DIAGNOSIS — E1143 Type 2 diabetes mellitus with diabetic autonomic (poly)neuropathy: Secondary | ICD-10-CM | POA: Diagnosis not present

## 2015-07-16 DIAGNOSIS — Z853 Personal history of malignant neoplasm of breast: Secondary | ICD-10-CM | POA: Diagnosis not present

## 2015-07-16 DIAGNOSIS — G2581 Restless legs syndrome: Secondary | ICD-10-CM | POA: Diagnosis not present

## 2015-07-16 DIAGNOSIS — J9601 Acute respiratory failure with hypoxia: Secondary | ICD-10-CM | POA: Diagnosis not present

## 2015-07-16 DIAGNOSIS — I5032 Chronic diastolic (congestive) heart failure: Secondary | ICD-10-CM

## 2015-07-16 DIAGNOSIS — I5031 Acute diastolic (congestive) heart failure: Secondary | ICD-10-CM | POA: Diagnosis not present

## 2015-07-16 DIAGNOSIS — I10 Essential (primary) hypertension: Secondary | ICD-10-CM | POA: Diagnosis not present

## 2015-07-16 DIAGNOSIS — G4733 Obstructive sleep apnea (adult) (pediatric): Secondary | ICD-10-CM | POA: Diagnosis not present

## 2015-07-16 NOTE — Patient Outreach (Addendum)
[name]) Care Management  07/16/2015  [name] 12-26-1940 [id number]   Referral Date:  07/15/2015 Referral Source:  Emmi HF RED Alert:  Lost interest in things they used to enjoy? Yes  Outreach call #1 to patient.    Providers: Primary MD: Dr. Colin Benton -  last appt: 07/15/2015 and next appt:  08/2015 Audiologist:  HH: [facility]:  SN, PT  Insurance: [employer] Medicare   Social: Patient is married and lives in her home with her husband.  Mobility: Ambulates with a cane.  Hearing loss - has hearing aides, follow by audiologist Falls: None Pain: yes / back  Depression:  Feeling down about current health but states she will discuss with MD if she feels a need to do so.   Transportation:  Husband or self Caregiver: husband  DME:  Cane, CBG, hearing aides, CBG meter.   THN conditions:  Diastolic Heart Failure, DMII Last Admission:  07/02/2015 -  07/07/2015 Morning weight 196 lbs A1C: 8.2  Medications:  Patient taking more than 10 medications  Co-pay cost issues: None  Flu Vaccine:  91416 Pneumonia Vaccine: PCV13 /14/2016 and PPS 10/11/2013   Plan:  [place] RN CM Referral  H/o CHF, DM2 -Hospital admission within the past 30 days.  -patient unable to use new CBG meter and new lancets.  States HH SN showed her but she is still not able to use.  Patient states she will use her old equipment until she can understand how to use.  -A1C 8.2  [place] Referral  -taking more than 10 medications with med changes and new meds initiated since last hospital admission 07/02/2015 -  07/07/2015   RN CM advised in next [facility] contact call within the next 10 business days.  RN CM advised to please notify MD of any changes in condition prior to scheduled appt's.   RN CM provided contact name and # [phone] or main office # [phone] and 24-hour nurse line # [phone].  RN CM confirmed patient is aware of 911 services for urgent emergency  needs.  Mariann Laster, RN, BSN, [facility], CCM  [employer] Management Coordinator [phone] Direct [phone] Cell [phone] Office [phone] Fax

## 2015-07-17 DIAGNOSIS — I5031 Acute diastolic (congestive) heart failure: Secondary | ICD-10-CM | POA: Diagnosis not present

## 2015-07-17 DIAGNOSIS — G2581 Restless legs syndrome: Secondary | ICD-10-CM | POA: Diagnosis not present

## 2015-07-17 DIAGNOSIS — E1143 Type 2 diabetes mellitus with diabetic autonomic (poly)neuropathy: Secondary | ICD-10-CM | POA: Diagnosis not present

## 2015-07-17 DIAGNOSIS — Z853 Personal history of malignant neoplasm of breast: Secondary | ICD-10-CM | POA: Diagnosis not present

## 2015-07-17 DIAGNOSIS — G4733 Obstructive sleep apnea (adult) (pediatric): Secondary | ICD-10-CM | POA: Diagnosis not present

## 2015-07-17 DIAGNOSIS — J9601 Acute respiratory failure with hypoxia: Secondary | ICD-10-CM | POA: Diagnosis not present

## 2015-07-17 DIAGNOSIS — I10 Essential (primary) hypertension: Secondary | ICD-10-CM | POA: Diagnosis not present

## 2015-07-18 ENCOUNTER — Ambulatory Visit: Payer: Self-pay | Admitting: *Deleted

## 2015-07-18 DIAGNOSIS — E1143 Type 2 diabetes mellitus with diabetic autonomic (poly)neuropathy: Secondary | ICD-10-CM | POA: Diagnosis not present

## 2015-07-18 DIAGNOSIS — G4733 Obstructive sleep apnea (adult) (pediatric): Secondary | ICD-10-CM | POA: Diagnosis not present

## 2015-07-18 DIAGNOSIS — I10 Essential (primary) hypertension: Secondary | ICD-10-CM | POA: Diagnosis not present

## 2015-07-18 DIAGNOSIS — G2581 Restless legs syndrome: Secondary | ICD-10-CM | POA: Diagnosis not present

## 2015-07-18 DIAGNOSIS — Z853 Personal history of malignant neoplasm of breast: Secondary | ICD-10-CM | POA: Diagnosis not present

## 2015-07-18 DIAGNOSIS — I5031 Acute diastolic (congestive) heart failure: Secondary | ICD-10-CM | POA: Diagnosis not present

## 2015-07-18 DIAGNOSIS — J9601 Acute respiratory failure with hypoxia: Secondary | ICD-10-CM | POA: Diagnosis not present

## 2015-07-19 ENCOUNTER — Other Ambulatory Visit: Payer: Self-pay | Admitting: *Deleted

## 2015-07-19 DIAGNOSIS — I5031 Acute diastolic (congestive) heart failure: Secondary | ICD-10-CM | POA: Diagnosis not present

## 2015-07-19 DIAGNOSIS — E1143 Type 2 diabetes mellitus with diabetic autonomic (poly)neuropathy: Secondary | ICD-10-CM | POA: Diagnosis not present

## 2015-07-19 DIAGNOSIS — G2581 Restless legs syndrome: Secondary | ICD-10-CM | POA: Diagnosis not present

## 2015-07-19 DIAGNOSIS — J9601 Acute respiratory failure with hypoxia: Secondary | ICD-10-CM | POA: Diagnosis not present

## 2015-07-19 DIAGNOSIS — Z853 Personal history of malignant neoplasm of breast: Secondary | ICD-10-CM | POA: Diagnosis not present

## 2015-07-19 DIAGNOSIS — I10 Essential (primary) hypertension: Secondary | ICD-10-CM | POA: Diagnosis not present

## 2015-07-19 DIAGNOSIS — G4733 Obstructive sleep apnea (adult) (pediatric): Secondary | ICD-10-CM | POA: Diagnosis not present

## 2015-07-19 NOTE — Patient Outreach (Signed)
Referral from Paulina, RN, for community care management. I spoke to [name] this morning and she advised me she is able to use her CBG monitor and her old lancet pen. Her glucose this am was 150. She is weighing and recording her wt daily. She states her weight has been stable between 196 and 197. It was 197 today. She denies SOB and edema, however, she sounds somewhat breathless when she is talking to me. She is receiving home health PT and OT. Next week the therapists are coming Monday-Thursday and she has agreed to meet with me on Friday morning at 10:00am. I have encouraged her to call me if she has any problems in the mean time.  Deloria Lair [facility] [phone]

## 2015-07-22 DIAGNOSIS — E1143 Type 2 diabetes mellitus with diabetic autonomic (poly)neuropathy: Secondary | ICD-10-CM | POA: Diagnosis not present

## 2015-07-22 DIAGNOSIS — J9601 Acute respiratory failure with hypoxia: Secondary | ICD-10-CM | POA: Diagnosis not present

## 2015-07-22 DIAGNOSIS — Z853 Personal history of malignant neoplasm of breast: Secondary | ICD-10-CM | POA: Diagnosis not present

## 2015-07-22 DIAGNOSIS — I5031 Acute diastolic (congestive) heart failure: Secondary | ICD-10-CM | POA: Diagnosis not present

## 2015-07-22 DIAGNOSIS — G4733 Obstructive sleep apnea (adult) (pediatric): Secondary | ICD-10-CM | POA: Diagnosis not present

## 2015-07-22 DIAGNOSIS — I10 Essential (primary) hypertension: Secondary | ICD-10-CM | POA: Diagnosis not present

## 2015-07-22 DIAGNOSIS — G2581 Restless legs syndrome: Secondary | ICD-10-CM | POA: Diagnosis not present

## 2015-07-23 ENCOUNTER — Telehealth: Payer: Self-pay | Admitting: Family Medicine

## 2015-07-23 DIAGNOSIS — J9601 Acute respiratory failure with hypoxia: Secondary | ICD-10-CM | POA: Diagnosis not present

## 2015-07-23 DIAGNOSIS — G4733 Obstructive sleep apnea (adult) (pediatric): Secondary | ICD-10-CM | POA: Diagnosis not present

## 2015-07-23 DIAGNOSIS — E1143 Type 2 diabetes mellitus with diabetic autonomic (poly)neuropathy: Secondary | ICD-10-CM | POA: Diagnosis not present

## 2015-07-23 DIAGNOSIS — I10 Essential (primary) hypertension: Secondary | ICD-10-CM | POA: Diagnosis not present

## 2015-07-23 DIAGNOSIS — Z853 Personal history of malignant neoplasm of breast: Secondary | ICD-10-CM | POA: Diagnosis not present

## 2015-07-23 DIAGNOSIS — G2581 Restless legs syndrome: Secondary | ICD-10-CM | POA: Diagnosis not present

## 2015-07-23 DIAGNOSIS — I5031 Acute diastolic (congestive) heart failure: Secondary | ICD-10-CM | POA: Diagnosis not present

## 2015-07-23 NOTE — Telephone Encounter (Signed)
Pt states she was to call insurance company for info on DM med. Pt would like you to give her a call back.

## 2015-07-23 NOTE — Telephone Encounter (Signed)
I called the pt and she stated she called her insurance company and they told her they could not give specific names of medications and for the doctor to choose the one they want and send it in.  Stated they told her Novolog was the insulin they would cover.

## 2015-07-24 ENCOUNTER — Other Ambulatory Visit: Payer: Self-pay | Admitting: Pharmacist

## 2015-07-24 DIAGNOSIS — E1143 Type 2 diabetes mellitus with diabetic autonomic (poly)neuropathy: Secondary | ICD-10-CM | POA: Diagnosis not present

## 2015-07-24 DIAGNOSIS — G2581 Restless legs syndrome: Secondary | ICD-10-CM | POA: Diagnosis not present

## 2015-07-24 DIAGNOSIS — I10 Essential (primary) hypertension: Secondary | ICD-10-CM | POA: Diagnosis not present

## 2015-07-24 DIAGNOSIS — Z853 Personal history of malignant neoplasm of breast: Secondary | ICD-10-CM | POA: Diagnosis not present

## 2015-07-24 DIAGNOSIS — J9601 Acute respiratory failure with hypoxia: Secondary | ICD-10-CM | POA: Diagnosis not present

## 2015-07-24 DIAGNOSIS — I5031 Acute diastolic (congestive) heart failure: Secondary | ICD-10-CM | POA: Diagnosis not present

## 2015-07-24 DIAGNOSIS — G4733 Obstructive sleep apnea (adult) (pediatric): Secondary | ICD-10-CM | POA: Diagnosis not present

## 2015-07-24 NOTE — Patient Outreach (Signed)
[facility]) Care Management  [place]   07/24/2015  [name] 1941/03/23 [id number]  Subjective: [name] is a 75 y.o. female referred to pharmacy for medication review. Reviewed patient's allergy, medication and problem list in order to perform this evaluation.  Also called and spoke with [name]. Reviewed with patient each of her medications including name, strength, dose and administration. Patient reports that she organizes her medications herself once a week, filling a morning and an evening weekly pillbox at that time.  Patient reports that she has been using Tylenol PM for treatment of aches and pains. Reports that she has been using Tylenol PM, rather than plain Tylenol because that is what she had around the house. Counseled patient to use Tylenol instead due to increased sedation and drying with the diphenhydramine component. Patient verbalizes understanding.  Patient reports that she has both Norco and methocarbamol on hand for more severe pain. However, reports that she does not need these very often. Reports that it has been at least 6 weeks since she used either one of these. Counsel patient about the sedation and fall risks with these medications. Patient denies any falls.  Patient reports that she checks her blood sugar once daily in the morning before breakfast. Reports the following morning blood sugars: 151 mg/dL (today),  169 mg/dL (Tuesday) and 162 mg/dL (Sunday). Reports that she and her PCP have been discussing adding an additional agent to improve control of her blood sugar. Reports that she is interested in knowing whether the new agent will be covered by her insurance. Note that patient has [facility] Medicare coverage. Offer to look up coverage information for the patient on the [employer] website. However, patient reports that she is not sure which agents her physician is considering at this time. Let patient know that she can call me  in the future if she would like assistance with accessing this type of coverage information.  Patient reports that she has no further questions for me at this time. Provide my contact information as well as the phone number for our [place] Management main line.  Objective:   Current Medications: Current Outpatient Prescriptions  Medication Sig Dispense Refill  . anastrozole (ARIMIDEX) 1 MG tablet TAKE ONE TABLET BY MOUTH ONCE DAILY 30 tablet 4  . carvedilol (COREG) 3.125 MG tablet Take 1 tablet (3.125 mg total) by mouth 2 (two) times daily with a meal. 60 tablet 0  . cholecalciferol (VITAMIN D) 1000 units tablet Take 1,000 Units by mouth 2 (two) times daily.    . diphenhydramine-acetaminophen (TYLENOL PM) 25-500 MG TABS Take 1 tablet by mouth at bedtime as needed (for pain /sleep).    . furosemide (LASIX) 40 MG tablet Take 1 tablet (40 mg total) by mouth daily. 30 tablet 0  . HYDROcodone-acetaminophen (NORCO) 7.5-325 MG per tablet Take 1-2 tablets by mouth every 4 (four) hours as needed for moderate pain. 90 tablet 0  . lisinopril (PRINIVIL) 10 MG tablet Take 1 tablet (10 mg total) by mouth daily. 30 tablet 0  . metFORMIN (GLUCOPHAGE) 1000 MG tablet TAKE ONE TABLET BY MOUTH TWICE DAILY 180 tablet 0  . methocarbamol (ROBAXIN) 500 MG tablet Take 500 mg by mouth every 6 (six) hours as needed for muscle spasms.    . pramipexole (MIRAPEX) 1 MG tablet TAKE ONE TABLET BY MOUTH TWICE DAILY (Patient taking differently: Take 1 mg by mouth daily at bedtime.) 180 tablet 0  . sitaGLIPtin (JANUVIA) 100  MG tablet Take 1 tablet (100 mg total) by mouth daily. 90 tablet 1   No current facility-administered medications for this visit.    Assessment:  Drugs sorted by system:  Cardiovascular: carvedilol, furosemide, lisinopril  Oncology: anastrozole  Endocrine: metformin, Januvia  Pain: Tylenol PM, Norco, methocarbamol, pramipexole  Vitamins/Minerals: Vitamin D3   Duplications in therapy: none  noted Gaps in therapy: none noted Medications to avoid in the elderly:  . Diphenhydramine: counseled patient to use Tylenol, rather than Tylenol PM, for treatment of pain . Opioids: patient counseled, denies any fall history; reports rare use, >6 weeks ago . Methocarbamol: patient counseled, denies any fall history; reports rare use, >6 weeks ago  Drug interactions:  . Norco + methocarbamol + diphenhydramine: CNS Depressants may enhance the CNS depressant effect of other CNS depressants: Counseled and advised to discontinue use of diphenhydramine   Other issues noted: elevated blood sugars   Plan:  1) Patient to purchase over the counter Tylenol and use in place of Tylenol PM for treatment of aches and pains.  2) Patient to follow up with PCP regarding elevated blood sugars and discussion of the addition of another agent.  3) Will close pharmacy episode at this time.  Harlow Asa, PharmD Clinical Pharmacist [place] Management [phone]

## 2015-07-25 DIAGNOSIS — G2581 Restless legs syndrome: Secondary | ICD-10-CM | POA: Diagnosis not present

## 2015-07-25 DIAGNOSIS — J9601 Acute respiratory failure with hypoxia: Secondary | ICD-10-CM | POA: Diagnosis not present

## 2015-07-25 DIAGNOSIS — G4733 Obstructive sleep apnea (adult) (pediatric): Secondary | ICD-10-CM | POA: Diagnosis not present

## 2015-07-25 DIAGNOSIS — I10 Essential (primary) hypertension: Secondary | ICD-10-CM | POA: Diagnosis not present

## 2015-07-25 DIAGNOSIS — E1143 Type 2 diabetes mellitus with diabetic autonomic (poly)neuropathy: Secondary | ICD-10-CM | POA: Diagnosis not present

## 2015-07-25 DIAGNOSIS — Z853 Personal history of malignant neoplasm of breast: Secondary | ICD-10-CM | POA: Diagnosis not present

## 2015-07-25 DIAGNOSIS — I5031 Acute diastolic (congestive) heart failure: Secondary | ICD-10-CM | POA: Diagnosis not present

## 2015-07-25 NOTE — Telephone Encounter (Signed)
I left a message for the pt to return my call. 

## 2015-07-25 NOTE — Telephone Encounter (Signed)
Please let her know, I would advise she start  Long acting insulin (toujeo 10 units daily) and see if we could give her a sample of the toujeo to try.  Or, we could try Invokana (100mg  daily with first meal). We talked about a lot of different options at her last visit. Does she have a preference on either these medications? Plan follow up as scheduled.

## 2015-07-26 ENCOUNTER — Encounter: Payer: Self-pay | Admitting: *Deleted

## 2015-07-26 ENCOUNTER — Other Ambulatory Visit: Payer: Self-pay | Admitting: *Deleted

## 2015-07-26 NOTE — Patient Outreach (Addendum)
[name]) Care Management   07/26/2015  [name] Dec 11, 1940 [id number]  [name] is an 75 y.o. female  Subjective: Initial home visit for care management. Pt had first hospitalization for CHF. She does not remember receiving any information about CHF when in the hospital. She has had home health and her nurse has been coaching her on how to manage this problem. She is weighing daily, checking her BP and CBG daily. She does not use salt but is learning about a low sodium diet. Pt will see her primary MD again on 08/19/15. She does not have a cardiologist.  Objective:   Review of Systems  Constitutional: Negative.   HENT: Negative.   Eyes: Negative.   Cardiovascular: Positive for leg swelling.  Gastrointestinal: Negative.   Genitourinary: Negative.   Musculoskeletal: Negative.   Neurological: Negative.   Psychiatric/Behavioral: Negative.    BP 140/70 mmHg  Pulse 79  Resp 18  Ht 1.562 m (5' 1.5")  Wt 195 lb (88.451 kg)  BMI 36.25 kg/m2  SpO2 93% FBS 147.  Physical Exam  Constitutional: She is oriented to person, place, and time. She appears well-developed and well-nourished.  HENT:  Head: Normocephalic.  Cardiovascular: Normal rate, regular rhythm and normal heart sounds.   Respiratory: Effort normal and breath sounds normal.  GI: Soft. Bowel sounds are normal.  Neurological: She is alert and oriented to person, place, and time.  Skin: Skin is warm and dry. There is erythema.  R ankle pink, severely dry skin.   Psychiatric: She has a normal mood and affect.   Diabetic Foot Exam - Simple   Simple Foot Form  Diabetic Foot exam was performed with the following findings:  Yes 07/26/2015 12:41 PM  Visual Inspection  No deformities, no ulcerations, no other skin breakdown bilaterally:  Yes  Sensation Testing  Intact to touch and monofilament testing bilaterally:  Yes  Pulse Check  Posterior Tibialis and Dorsalis pulse intact bilaterally:  Yes   Comments     Current Medications:   Current Outpatient Prescriptions  Medication Sig Dispense Refill  . anastrozole (ARIMIDEX) 1 MG tablet TAKE ONE TABLET BY MOUTH ONCE DAILY 30 tablet 4  . carvedilol (COREG) 3.125 MG tablet Take 1 tablet (3.125 mg total) by mouth 2 (two) times daily with a meal. 60 tablet 0  . cholecalciferol (VITAMIN D) 1000 units tablet Take 1,000 Units by mouth 2 (two) times daily.    . diphenhydramine-acetaminophen (TYLENOL PM) 25-500 MG TABS Take 1 tablet by mouth at bedtime as needed (for pain /sleep).    . furosemide (LASIX) 40 MG tablet Take 1 tablet (40 mg total) by mouth daily. 30 tablet 0  . HYDROcodone-acetaminophen (NORCO) 7.5-325 MG per tablet Take 1-2 tablets by mouth every 4 (four) hours as needed for moderate pain. 90 tablet 0  . lisinopril (PRINIVIL) 10 MG tablet Take 1 tablet (10 mg total) by mouth daily. 30 tablet 0  . metFORMIN (GLUCOPHAGE) 1000 MG tablet TAKE ONE TABLET BY MOUTH TWICE DAILY 180 tablet 0  . pramipexole (MIRAPEX) 1 MG tablet TAKE ONE TABLET BY MOUTH TWICE DAILY (Patient taking differently: Take 1 mg by mouth daily at bedtime.) 180 tablet 0  . sitaGLIPtin (JANUVIA) 100 MG tablet Take 1 tablet (100 mg total) by mouth daily. 90 tablet 1  . methocarbamol (ROBAXIN) 500 MG tablet Take 500 mg by mouth every 6 (six) hours as needed for muscle spasms. Reported on 07/26/2015     No current facility-administered  medications for this visit.    Functional Status:   In your present state of health, do you have any difficulty performing the following activities: 07/26/2015 07/16/2015  Hearing? - Y  Vision? - N  Difficulty concentrating or making decisions? - N  Walking or climbing stairs? - Y  Dressing or bathing? - N  Doing errands, shopping? - Y  Preparing Food and eating ? Y -  Using the Toilet? N -  In the past six months, have you accidently leaked urine? Y -  Do you have problems with loss of bowel control? Y -  Managing your Medications?  N -  Managing your Finances? N -  Housekeeping or managing your Housekeeping? N -    Fall/Depression Screening:    PHQ 2/9 Scores 07/16/2015 04/18/2015 01/30/2015 10/11/2013 02/01/2013 06/20/2012  PHQ - 2 Score 1 2 0 0 1 0   Fall Risk  07/24/2015 07/16/2015 04/18/2015 01/30/2015 10/11/2013  Falls in the past year? No No No No No  Risk for fall due to : - History of fall(s);Impaired balance/gait;Impaired mobility;Medication side effect - - -   Assessment:  CHF                         Xerosis                         DM  Plan: Discussed daily CHF management and gave pt education and action plan.           Will give additional low sodium diet Emmi programs           Suggest pt use Eucerin lotion for her dry skin, apply liberally bid.           I will see pt again in one month.  DR. Maudie Mercury WOULD YOU LIKE TO SUGGEST A CARDIOLOGIST FOR HER??????  THN CM Care Plan Problem One        Most Recent Value   THN CM Short Term Goal #3 (0-30 days)  Pt will read Heart Failure book by my next visit.   [facility] CM Short Term Goal #3 Start Date  07/26/15     [name] [facility] [place] [phone]

## 2015-07-27 ENCOUNTER — Other Ambulatory Visit: Payer: Self-pay | Admitting: Family Medicine

## 2015-07-29 DIAGNOSIS — I10 Essential (primary) hypertension: Secondary | ICD-10-CM | POA: Diagnosis not present

## 2015-07-29 DIAGNOSIS — Z853 Personal history of malignant neoplasm of breast: Secondary | ICD-10-CM | POA: Diagnosis not present

## 2015-07-29 DIAGNOSIS — I5031 Acute diastolic (congestive) heart failure: Secondary | ICD-10-CM | POA: Diagnosis not present

## 2015-07-29 DIAGNOSIS — G2581 Restless legs syndrome: Secondary | ICD-10-CM | POA: Diagnosis not present

## 2015-07-29 DIAGNOSIS — E1143 Type 2 diabetes mellitus with diabetic autonomic (poly)neuropathy: Secondary | ICD-10-CM | POA: Diagnosis not present

## 2015-07-29 DIAGNOSIS — J9601 Acute respiratory failure with hypoxia: Secondary | ICD-10-CM | POA: Diagnosis not present

## 2015-07-29 DIAGNOSIS — G4733 Obstructive sleep apnea (adult) (pediatric): Secondary | ICD-10-CM | POA: Diagnosis not present

## 2015-07-29 NOTE — Telephone Encounter (Signed)
I left a message for the pt to return my call. 

## 2015-07-29 NOTE — Patient Outreach (Signed)
[place] [facility]) Care Management  07/29/2015  SHENETA RUAN 1941/03/31 [id number]   Patient triggered RED on EMMI Heart Failure dashboard, notification sent to Deloria Lair, NP.  Thanks, Ronnell Freshwater. [place], [place] Assistant Phone: [phone] Fax: [phone]

## 2015-07-31 DIAGNOSIS — Z853 Personal history of malignant neoplasm of breast: Secondary | ICD-10-CM | POA: Diagnosis not present

## 2015-07-31 DIAGNOSIS — I5031 Acute diastolic (congestive) heart failure: Secondary | ICD-10-CM | POA: Diagnosis not present

## 2015-07-31 DIAGNOSIS — G2581 Restless legs syndrome: Secondary | ICD-10-CM | POA: Diagnosis not present

## 2015-07-31 DIAGNOSIS — E1143 Type 2 diabetes mellitus with diabetic autonomic (poly)neuropathy: Secondary | ICD-10-CM | POA: Diagnosis not present

## 2015-07-31 DIAGNOSIS — J9601 Acute respiratory failure with hypoxia: Secondary | ICD-10-CM | POA: Diagnosis not present

## 2015-07-31 DIAGNOSIS — I10 Essential (primary) hypertension: Secondary | ICD-10-CM | POA: Diagnosis not present

## 2015-07-31 DIAGNOSIS — G4733 Obstructive sleep apnea (adult) (pediatric): Secondary | ICD-10-CM | POA: Diagnosis not present

## 2015-08-01 ENCOUNTER — Other Ambulatory Visit: Payer: Self-pay | Admitting: *Deleted

## 2015-08-01 ENCOUNTER — Telehealth: Payer: Self-pay | Admitting: *Deleted

## 2015-08-01 MED ORDER — CARVEDILOL 3.125 MG PO TABS: 3.1250 mg | ORAL_TABLET | Freq: Two times a day (BID) | ORAL | Status: DC

## 2015-08-01 MED ORDER — LISINOPRIL 10 MG PO TABS: 10.0000 mg | ORAL_TABLET | Freq: Every day | ORAL | Status: DC

## 2015-08-01 MED ORDER — FUROSEMIDE 40 MG PO TABS: 40.0000 mg | ORAL_TABLET | Freq: Every day | ORAL | Status: DC

## 2015-08-01 NOTE — Telephone Encounter (Signed)
I called the pt and scheduled her for an appt for tomorrow at 11:30am.

## 2015-08-01 NOTE — Telephone Encounter (Signed)
Bradd Canary, NP with [facility] [phone]) called to let Dr Maudie Mercury know she is doing case management on this patient and she was seen for the first time last week and her depression score was 1 and she has been doing the emmi program and has red flags due to lost of interest in doing things.  States today she did a PHQ9 and her score was 14 and she wanted to know if Dr Maudie Mercury could start the pt on an anti-depressant since she does not have an appt until next month?  She also wanted to let Dr Maudie Mercury know that today her husband was not there with her at the initial visit and she questioned if the pt was being truthful? She also feels the pt is in a lot of pain and feels this is contributing to her depression.  Dr Maudie Mercury was informed of this, stated the pt was seen for this and declined treatment and stated the pt needs an appt.  I called the pt and left a message for her to call me back as a nurse practitioner called about her.

## 2015-08-01 NOTE — Telephone Encounter (Signed)
I informed the pt we can discuss the medication at her visit on Friday and she agreed.

## 2015-08-01 NOTE — Telephone Encounter (Signed)
Patient requests refills on the new medications that were given at her last hospital visit.

## 2015-08-01 NOTE — Patient Outreach (Signed)
Red flag on Emmi program for Depression.  I called and talked with pt today and I advised her why I was calling, her answers on the Emmi call.  I asked if I could repeat the depression screening questions to her that she had answered last week. Her husaband was present during this interview and she may have answered those questions differently had he not been participating in the intake evaluation. She scored a 1 on the PHQ2 last week. Today she scored a 14 on the PHQ9.  I advised her that I would contact Dr. Maudie Mercury and see if she would like to start an antidepressant.  I called Dr. Julianne Rice office and spoke to her nurse, leaving a detailed message. They will call me back.  Deloria Lair [facility] [place] [phone]

## 2015-08-02 ENCOUNTER — Ambulatory Visit (INDEPENDENT_AMBULATORY_CARE_PROVIDER_SITE_OTHER): Payer: Commercial Managed Care - HMO | Admitting: Family Medicine

## 2015-08-02 ENCOUNTER — Encounter: Payer: Self-pay | Admitting: Family Medicine

## 2015-08-02 VITALS — BP 120/80 | HR 76 | Temp 97.8°F | Ht 61.5 in | Wt 195.0 lb

## 2015-08-02 DIAGNOSIS — M25532 Pain in left wrist: Secondary | ICD-10-CM

## 2015-08-02 DIAGNOSIS — E1140 Type 2 diabetes mellitus with diabetic neuropathy, unspecified: Secondary | ICD-10-CM

## 2015-08-02 DIAGNOSIS — M1990 Unspecified osteoarthritis, unspecified site: Secondary | ICD-10-CM

## 2015-08-02 DIAGNOSIS — F330 Major depressive disorder, recurrent, mild: Secondary | ICD-10-CM | POA: Diagnosis not present

## 2015-08-02 MED ORDER — CANAGLIFLOZIN 100 MG PO TABS: 100.0000 mg | ORAL_TABLET | Freq: Every day | ORAL | Status: DC

## 2015-08-02 NOTE — Patient Instructions (Signed)
Before you leave: -In sure that you have follow-up in about 3 months  Wear the wrist splint at night. Tylenol [phone] mg up to 3 times daily as needed for pain.  Call in 2 weeks if the pain in her wrists and other pain issues are not improving. We will plan to have you see an orthopedic specialist. Call sooner if issues are worsening.  Please call today to set up an appointment with Dr. Glennon Hamilton to assist with the stress and depression. Follow up immediately if symptoms are worsening.

## 2015-08-02 NOTE — Progress Notes (Signed)
Pre visit review using our clinic review tool, if applicable. No additional management support is needed unless otherwise documented below in the visit note. 

## 2015-08-02 NOTE — Progress Notes (Signed)
HPI:  [name] is a 75 yo with a past medical history significant for hypertension, mild diastolic CHF, diabetes, osteoarthritis and  chronic mild depression here for an acute visit for:  MDD: -Appointment scheduled after phone call from a worried home health nurse after a positive depression screen  -However, this is not new, and she reports it is not worsening  -she has always had mild depression -dealing with a lot at home with husband's health and 19 yo grandaughter and now her health  -denies: thoughts of self harm, panic, manic symptoms -feels safe -She declined treatment in the past  - she still declines medications, however agrees to consider CBT  Pain: -Chronic, somewhat worsened since recent physical therapy -Primary pain is in her left wrist, but also has bilateral knee pain at times and shoulder pains at times -She is not taking anything for this -Denies falls, fevers, weakness or numbness other than that described below -She has seen Dr. Wynelle Link in the past  CTS -Chronic, has a brace but does not use it -Pain in the thenar eminence and wrist -Weakening grip strength -Seems to worsen with recent physical therapy  ROS: See pertinent positives and negatives per HPI.  Past Medical History  Diagnosis Date  . Hypertension   . Diabetes ([place])   . Diastolic CHF ([place])     Dx w/ hospitalization 06/2015 for respiratory failure  . Sleep apnea   . RLS (restless legs syndrome)   . MDD (major depressive disorder) ([place])   . No blood products ([place])  09/11/2013  . RESTLESS LEGS SYNDROME 05/05/2007    Qualifier: Diagnosis of  By: Gwenette Greet MD, Armando Reichert   . History of environmental allergies   . Breast cancer ([place])     R breast, s/p radiation 34Gy/50fx 04/29/10-05/05/10  . Anxiety     occasional  . History of kidney stones 20 years ago  . Falls frequently     "can not pick left leg up"  . Osteoarthritis     hx shoulder, knee, back, wrist pain; saw Dr. Wynelle Link    . Hearing loss     bilateral hearing aides  . AK (actinic keratosis)   . Foot fracture   . CTS (carpal tunnel syndrome)     Left    Past Surgical History  Procedure Laterality Date  . Flex signoidoscopy    . Incision and drainage perirectal abscess    . Tubal ligation  1979  . Breast surgery  2012    rt breast lumpectomy  . Appendectomy  age 34  . Joint replacement Right 2006    knee  . 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 Cancer Sister     breast  . Heart disease Paternal Grandfather   . Diabetes Other   . Obesity Other   . Heart disease Brother     Valve  . Alcohol abuse Father     Social History   Social History  . Marital Status: Married    Spouse Name: N/A  . Number of Children: 3  . Years of Education: N/A   Social History Main Topics  . Smoking status: Never Smoker   . Smokeless tobacco: Never Used  . Alcohol Use: No  . Drug Use: No  . Sexual Activity: Yes  Other Topics Concern  . None   Social History Narrative         Home Situation: lives with husband and granddaughter      Spiritual Beliefs: Jehovah Witness - no blood products                    Current outpatient prescriptions:  .  anastrozole (ARIMIDEX) 1 MG tablet, TAKE ONE TABLET BY MOUTH ONCE DAILY, Disp: 30 tablet, Rfl: 4 .  carvedilol (COREG) 3.125 MG tablet, Take 1 tablet (3.125 mg total) by mouth 2 (two) times daily with a meal., Disp: 60 tablet, Rfl: 5 .  cholecalciferol (VITAMIN D) 1000 units tablet, Take 1,000 Units by mouth 2 (two) times daily., Disp: , Rfl:  .  diphenhydramine-acetaminophen (TYLENOL PM) 25-500 MG TABS, Take 1 tablet by mouth at bedtime as needed (for pain /sleep)., Disp: , Rfl:  .  furosemide (LASIX) 40 MG tablet, Take 1 tablet (40 mg total) by mouth daily., Disp: 30  tablet, Rfl: 5 .  HYDROcodone-acetaminophen (NORCO) 7.5-325 MG per tablet, Take 1-2 tablets by mouth every 4 (four) hours as needed for moderate pain., Disp: 90 tablet, Rfl: 0 .  lisinopril (PRINIVIL) 10 MG tablet, Take 1 tablet (10 mg total) by mouth daily., Disp: 30 tablet, Rfl: 5 .  metFORMIN (GLUCOPHAGE) 1000 MG tablet, TAKE ONE TABLET BY MOUTH TWICE DAILY, Disp: 180 tablet, Rfl: 0 .  metFORMIN (GLUCOPHAGE) 1000 MG tablet, TAKE ONE TABLET BY MOUTH TWICE DAILY, Disp: 180 tablet, Rfl: 2 .  methocarbamol (ROBAXIN) 500 MG tablet, Take 500 mg by mouth every 6 (six) hours as needed for muscle spasms. Reported on 07/26/2015, Disp: , Rfl:  .  pramipexole (MIRAPEX) 1 MG tablet, TAKE ONE TABLET BY MOUTH TWICE DAILY (Patient taking differently: Take 1 mg by mouth daily at bedtime.), Disp: 180 tablet, Rfl: 0 .  Pyridoxine HCl (VITAMIN B-6 PO), Take by mouth., Disp: , Rfl:  .  sitaGLIPtin (JANUVIA) 100 MG tablet, Take 1 tablet (100 mg total) by mouth daily., Disp: 90 tablet, Rfl: 1 .  canagliflozin (INVOKANA) 100 MG TABS tablet, Take 1 tablet (100 mg total) by mouth daily before breakfast., Disp: 30 tablet, Rfl: 3  EXAM:  Filed Vitals:   08/02/15 1125  BP: 120/80  Pulse: 76  Temp: 97.8 F (36.6 C)    Body mass index is 36.25 kg/(m^2).  GENERAL: vitals reviewed and listed above, alert, oriented, appears well hydrated and in no acute distress  HEENT: atraumatic, conjunttiva clear, no obvious abnormalities on inspection of external nose and ears  NECK: no obvious masses on inspection  LUNGS: clear to auscultation bilaterally, no wheezes, rales or rhonchi, good air movement  CV: HRRR, no peripheral edema  MS: moves all extremities without noticeable abnormality, uses cane to walk, slow but steady gait, tenderness to palpation in the left thenar eminence and wrists diffusely, mildly decreased left grip strength and left thumb strength, positive Phalen. Mild tenderness to palpation in multiple  muscle groups bilaterally in the upper extremities and neck no other appreciable weakness neurovascularly intact otherwise.   PSYCH: pleasant and cooperative, no obvious depression or anxiety  ASSESSMENT AND PLAN:  Discussed the following assessment and plan:  Mild episode of recurrent major depressive disorder ([place]) -Discussed treatment options. Declined medications. Agreed to consider CBT. Brochure provided with contact information for Dr. Glennon Hamilton and other counselors and psychologist. Advised to consider Celexa her Cymbalta as may help with her pain and her depression.  Left wrist pain -  Suspect carpal tunnel syndrome worsened by recent physical therapy. Cock up wrist brace applied and fitted. Tylenol safe dosing discussed. Advised to call if this is worsening or not improving over the next 2 weeks and may have her see sports medicine or orthopnea.  Osteoarthritis, unspecified osteoarthritis type, unspecified site -Suspect this is the cause of her other pain. Trial Tylenol for pain. Discussed interactions with Norco which she reports she is not taking. Discussed safe dosing.   Uncontrolled diabetes: -She has opted to try adding Invokana. My assistant helped her with prescriptions for this.  -Patient advised to return or notify a doctor immediately if symptoms worsen or persist or new concerns arise.  Patient Instructions  Before you leave: -In sure that you have follow-up in about 3 months  Wear the wrist splint at night. Tylenol [phone] mg up to 3 times daily as needed for pain.  Call in 2 weeks if the pain in her wrists and other pain issues are not improving. We will plan to have you see an orthopedic specialist. Call sooner if issues are worsening.  Please call today to set up an appointment with Dr. Glennon Hamilton to assist with the stress and depression. Follow up immediately if symptoms are worsening.      Colin Benton R.

## 2015-08-02 NOTE — Addendum Note (Signed)
Addended by: Agnes Lawrence on: 08/02/2015 12:31 PM   Modules accepted: Orders

## 2015-08-02 NOTE — Telephone Encounter (Signed)
I discussed medications with the patient and instructed her on how to use Toujeo and she stated she would prefer to have a pill.  I advised her Anastasio Auerbach will be sent to her pharmacy.

## 2015-08-07 DIAGNOSIS — G4733 Obstructive sleep apnea (adult) (pediatric): Secondary | ICD-10-CM | POA: Diagnosis not present

## 2015-08-07 DIAGNOSIS — E1143 Type 2 diabetes mellitus with diabetic autonomic (poly)neuropathy: Secondary | ICD-10-CM | POA: Diagnosis not present

## 2015-08-07 DIAGNOSIS — J9601 Acute respiratory failure with hypoxia: Secondary | ICD-10-CM | POA: Diagnosis not present

## 2015-08-07 DIAGNOSIS — Z853 Personal history of malignant neoplasm of breast: Secondary | ICD-10-CM | POA: Diagnosis not present

## 2015-08-07 DIAGNOSIS — I10 Essential (primary) hypertension: Secondary | ICD-10-CM | POA: Diagnosis not present

## 2015-08-07 DIAGNOSIS — I5031 Acute diastolic (congestive) heart failure: Secondary | ICD-10-CM | POA: Diagnosis not present

## 2015-08-07 DIAGNOSIS — G2581 Restless legs syndrome: Secondary | ICD-10-CM | POA: Diagnosis not present

## 2015-08-10 ENCOUNTER — Other Ambulatory Visit: Payer: Self-pay | Admitting: Family Medicine

## 2015-08-19 ENCOUNTER — Ambulatory Visit: Payer: Commercial Managed Care - HMO | Admitting: Family Medicine

## 2015-08-19 ENCOUNTER — Other Ambulatory Visit: Payer: Self-pay | Admitting: *Deleted

## 2015-08-19 NOTE — Patient Outreach (Signed)
Called pt to follow up on her answers to a CHF Emmi Report. No one answered the phone. I left a message to please return my call.  Deloria Lair [facility] [phone]  Pt returmed my call immediately. She denies any CHF exacerbation issues. She does report a HA that has been present for a couple of weeks. She denies seasonal allergies. She does have a runny nose that drips clear sinus drainage. She says her HA is posterior and on the lower R side near her neck. She has taken some tylenol pm that doesn't work. I asked her if she has tried to massage the area and she said no but she has put heat on it and that has helped. I advised her that if it continues she should see her MD. I also asked her to try some plain acetaminophen. I will see her next Wed. April 12th at her home.  Deloria Lair [facility] [phone]

## 2015-08-19 NOTE — Patient Outreach (Signed)
[facility]) Care Management  08/19/2015  [name] 1941/01/22 [id number]   Patient triggered RED on EMMI Heart Failure, notification sent to Deloria Lair, NP.  Thanks, Ronnell Freshwater. [place], [place] Assistant Phone: [phone] Fax: [phone]

## 2015-08-22 ENCOUNTER — Ambulatory Visit (INDEPENDENT_AMBULATORY_CARE_PROVIDER_SITE_OTHER): Payer: Commercial Managed Care - HMO | Admitting: Cardiology

## 2015-08-22 ENCOUNTER — Encounter: Payer: Self-pay | Admitting: Cardiology

## 2015-08-22 VITALS — BP 134/72 | HR 78 | Ht 61.0 in | Wt 193.6 lb

## 2015-08-22 DIAGNOSIS — I5031 Acute diastolic (congestive) heart failure: Secondary | ICD-10-CM | POA: Diagnosis not present

## 2015-08-22 NOTE — Progress Notes (Signed)
Cardiology Office Note   Date:  08/22/2015   ID:  [name], DOB 05/31/1940, MRN [id number]  PCP:  [name]., DO  Cardiologist:   Minus Breeding, MD   Chief Complaint  Patient presents with  . Annual Exam  . Shortness of Breath    NO  . Dizziness    YES  . Chest Pain    NO  . Edema    YES      History of Present Illness: [name] is a 75 y.o. female who presents for follow-up after recent hospitalization. I reviewed these records.  She was in the hospital with shortness of breath. This had accumulated slowly with increased weight. She had some edema. Her dose of diuretic had been reduced because she's been urinating quite a bit and she started retaining fluid. In the hospital she had an echocardiogram which demonstrated no significant valvular abnormalities and a well preserved ejection fraction. She was diuresed and was 197 pounds on going home actually now is about 190 pounds. She is feeling better. She's not having any new shortness of breath, PND or orthopnea. She's not having any new palpitations, presyncope or syncope. She denies any chest pressure, neck or arm discomfort. She is limited in her activities because of joint pain.    Past Medical History  Diagnosis Date  . Hypertension   . Diabetes ([place])   . Diastolic CHF ([place])     Dx w/ hospitalization 06/2015 for respiratory failure  . Sleep apnea   . RLS (restless legs syndrome)   . MDD (major depressive disorder) ([place])   . No blood products ([place])  09/11/2013  . RESTLESS LEGS SYNDROME 05/05/2007    Qualifier: Diagnosis of  By: Gwenette Greet MD, Armando Reichert   . History of environmental allergies   . Breast cancer ([place])     R breast, s/p radiation 34Gy/4fx 04/29/10-05/05/10  . Anxiety     occasional  . History of kidney stones 20 years ago  . Falls frequently     "can not pick left leg up"  . Osteoarthritis     hx shoulder, knee, back, wrist pain; saw Dr. Wynelle Link   . Hearing loss     bilateral  hearing aides  . AK (actinic keratosis)   . Foot fracture   . CTS (carpal tunnel syndrome)     Left    Past Surgical History  Procedure Laterality Date  . Flex signoidoscopy    . Incision and drainage perirectal abscess    . Tubal ligation  1979  . Breast surgery  2012    rt breast lumpectomy  . Appendectomy  age 57  . Joint replacement Right 2006    knee  . Total knee arthroplasty Left 08/06/2014    Procedure: LEFT TOTAL KNEE ARTHROPLASTY;  Surgeon: Gaynelle Arabian, MD;  Location: [facility];  Service: Orthopedics;  Laterality: Left;     Current Outpatient Prescriptions  Medication Sig Dispense Refill  . anastrozole (ARIMIDEX) 1 MG tablet TAKE ONE TABLET BY MOUTH ONCE DAILY 30 tablet 4  . canagliflozin (INVOKANA) 100 MG TABS tablet Take 1 tablet (100 mg total) by mouth daily before breakfast. 30 tablet 3  . carvedilol (COREG) 3.125 MG tablet Take 1 tablet (3.125 mg total) by mouth 2 (two) times daily with a meal. 60 tablet 5  . cholecalciferol (VITAMIN D) 1000 units tablet Take 1,000 Units by mouth 2 (two) times daily.    . diphenhydramine-acetaminophen (TYLENOL PM) 25-500  MG TABS Take 1 tablet by mouth at bedtime as needed (for pain /sleep).    . furosemide (LASIX) 40 MG tablet Take 1 tablet (40 mg total) by mouth daily. 30 tablet 5  . HYDROcodone-acetaminophen (NORCO) 7.5-325 MG per tablet Take 1-2 tablets by mouth every 4 (four) hours as needed for moderate pain. 90 tablet 0  . lisinopril (PRINIVIL) 10 MG tablet Take 1 tablet (10 mg total) by mouth daily. 30 tablet 5  . metFORMIN (GLUCOPHAGE) 1000 MG tablet TAKE ONE TABLET BY MOUTH TWICE DAILY 180 tablet 0  . metFORMIN (GLUCOPHAGE) 1000 MG tablet TAKE ONE TABLET BY MOUTH TWICE DAILY 180 tablet 2  . methocarbamol (ROBAXIN) 500 MG tablet Take 500 mg by mouth every 6 (six) hours as needed for muscle spasms. Reported on 07/26/2015    . pramipexole (MIRAPEX) 1 MG tablet TAKE ONE TABLET BY MOUTH TWICE DAILY 180 tablet 1  . Pyridoxine HCl  (VITAMIN B-6 PO) Take by mouth.    . sitaGLIPtin (JANUVIA) 100 MG tablet Take 1 tablet (100 mg total) by mouth daily. 90 tablet 1   No current facility-administered medications for this visit.    Allergies:   Sulfonamide derivatives and Other    ROS:  Please see the history of present illness.   Otherwise, review of systems are positive for none.   All other systems are reviewed and negative.    PHYSICAL EXAM: VS:  BP 134/72 mmHg  Pulse 78  Ht 5\' 1"  (1.549 m)  Wt 193 lb 9.6 oz (87.816 kg)  BMI 36.60 kg/m2 , BMI Body mass index is 36.6 kg/(m^2). GENERAL:  Well appearing HEENT:  Pupils equal round and reactive, fundi not visualized, oral mucosa unremarkable NECK:  No jugular venous distention, waveform within normal limits, carotid upstroke brisk and symmetric, no bruits, no thyromegaly LYMPHATICS:  No cervical, inguinal adenopathy LUNGS:  Clear to auscultation bilaterally BACK:  No CVA tenderness CHEST:  Unremarkable HEART:  PMI not displaced or sustained,S1 and S2 within normal limits, no S3, no S4, no clicks, no rubs, no murmurs ABD:  Flat, positive bowel sounds normal in frequency in pitch, no bruits, no rebound, no guarding, no midline pulsatile mass, no hepatomegaly, no splenomegaly EXT:  2 plus pulses throughout, no edema, no cyanosis no clubbing SKIN:  No rashes no nodules NEURO:  Cranial nerves II through XII grossly intact, motor grossly intact throughout PSYCH:  Cognitively intact, oriented to person place and time    EKG:  EKG is not ordered today.    Recent Labs: 07/02/2015: B Natriuretic Peptide 20.4 07/03/2015: ALT 16; Hemoglobin 13.9; Platelets 252 07/07/2015: Magnesium 1.9 07/15/2015: BUN 15; Creatinine, Ser 0.58; Potassium 4.1; Sodium 135    Lipid Panel    Component Value Date/Time   CHOL 129 10/03/2014 0919   TRIG 70.0 10/03/2014 0919   HDL 38.30* 10/03/2014 0919   CHOLHDL 3 10/03/2014 0919   VLDL 14.0 10/03/2014 0919   LDLCALC 77 10/03/2014 0919    LDLDIRECT 111.6 02/14/2010 1406      Wt Readings from Last 3 Encounters:  08/22/15 193 lb 9.6 oz (87.816 kg)  08/02/15 195 lb (88.451 kg)  07/26/15 195 lb (88.451 kg)      Other studies Reviewed: Additional studies/ records that were reviewed today include:  Hospital records Review of the above records demonstrates:  Please see elsewhere in the note.  See elsewhere   ASSESSMENT AND PLAN:  CHF:  The patient had acute diastolic heart failure. She seems to be  euvolemic. I spent a long time talking to her about salt restriction and fluid restriction. She's going to continue daily weights. At this point I don't think she needs further change to her medications.   I will check a basic metabolic profile.  DM:  I do note that her last hemoglobin A1c was 6.8.  HTN:  The blood pressure is at target. No change in medications is indicated. We will continue with therapeutic lifestyle changes (TLC).    Current medicines are reviewed at length with the patient today.  The patient does not have concerns regarding medicines.  The following changes have been made:  no change  Labs/ tests ordered today include: none  No orders of the defined types were placed in this encounter.     Disposition:   FU with me in early June.      Signed, Minus Breeding, MD  08/22/2015 4:57 PM    [facility]

## 2015-08-22 NOTE — Patient Instructions (Signed)
NO CHANGES IN CURRENT MEDICATIONS  Your physician recommends that you schedule a follow-up appointment in October 22, 2015 AT 1:30 PM   WITH DR [facility]  If you need a refill on your cardiac medications before your next appointment, please call your pharmacy.

## 2015-08-26 DIAGNOSIS — H5210 Myopia, unspecified eye: Secondary | ICD-10-CM | POA: Diagnosis not present

## 2015-08-26 LAB — HM DIABETES EYE EXAM

## 2015-08-28 ENCOUNTER — Other Ambulatory Visit: Payer: Self-pay | Admitting: *Deleted

## 2015-08-28 MED ORDER — PREDNISONE 10 MG (21) PO TBPK: ORAL_TABLET | ORAL | Status: AC

## 2015-08-28 NOTE — Patient Outreach (Signed)
[name]) Care Management  08/28/2015  [name] [birth year] [id number]   S:  Pt is doing well as far as her CHF self management.Her weight is stable at 188 give or take a pound. Pt has never added salt to her diet and she is becoming more aware of the sodium content by looking at labels. She denies any SOB or edema.  Her breakfast consists of cereal and a banana. Lunch is usually eaten out. They went to [employer]. She could tell me what sodium content soda drinks.  Pt did go to see a cardiologist (Dr. Percival Spanish). He made no changes in her medical regimen. She will follow up in June if she is still in town. (They ususally go to [place] for 6 weeks)  Fasting glucose levels have ranged from 127 - 160.  OA pain level 10 in L wrist and level 8 in R ankle  O:  BP 150/80 mmHg  Pulse 88  Resp 16  Wt 188 lb (85.276 kg)  SpO2 98%       RRR       Lungs are clear       No peripheral edema       L wrist is somewhat edemaous, slightly pink and warm. It is very tender.  A:  CHF - stable       DM - fair control       OA pain  P:  We discussed fast food salt content, especially the sauces. We looked at fast food menu sodium contents which was very helpful.   Gave prednisone 10 mg 6 day taper for her OA pain/inflammation. Warned that glucose levels will rise.  Instructed pt to call if any medication side effects.   I will see pt in a month.  Deloria Lair [facility] [place] [phone]

## 2015-09-25 ENCOUNTER — Encounter: Payer: Self-pay | Admitting: *Deleted

## 2015-09-25 ENCOUNTER — Other Ambulatory Visit: Payer: Self-pay | Admitting: *Deleted

## 2015-09-25 NOTE — Patient Outreach (Signed)
[facility]) Care Management   09/25/2015  [name] [birth year] [id number]  [name] is an 75 y.o. female  Subjective: No new problems. No CHF sxs. Glucose levels are slightly raised. She sometimes has middle of the night snacks which accounts for her more elevated first thing in the morning levels. She says she never sees a level truly in the goal range. She tries but she just can't get there.   She reports the prednisone taper helped her wrist pain but pain came right back after the course was completed. She continues to wear a splint prn during the day and every night.  Objective:   Review of Systems  Constitutional: Negative.   HENT: Negative.   Eyes: Negative.   Respiratory: Positive for shortness of breath.        SOB with activity.  Cardiovascular: Negative.   Gastrointestinal: Negative.   Genitourinary: Positive for frequency.  Skin: Negative.   Neurological: Negative.   Endo/Heme/Allergies: Negative.   Psychiatric/Behavioral: Negative.    BP 162/80 mmHg  Pulse 73  Resp 18  Wt 188 lb (85.276 kg)  SpO2 94% NFBS: 161  Physical Exam  Constitutional: She is oriented to person, place, and time. She appears well-developed and well-nourished.  HENT:  Head: Normocephalic.  Neck: Normal range of motion.  Cardiovascular: Normal rate, regular rhythm and normal heart sounds.   Respiratory: Effort normal and breath sounds normal.  GI: Soft. Bowel sounds are normal.  Musculoskeletal:  L wrist pain and back pain chronic.  Neurological: She is alert and oriented to person, place, and time.  Skin: Skin is warm and dry.  Psychiatric: She has a normal mood and affect. Her behavior is normal. Judgment and thought content normal.    Encounter Medications:   Outpatient Encounter Prescriptions as of 09/25/2015  Medication Sig  . anastrozole (ARIMIDEX) 1 MG tablet TAKE ONE TABLET BY MOUTH ONCE DAILY  . canagliflozin (INVOKANA) 100 MG TABS tablet Take 1  tablet (100 mg total) by mouth daily before breakfast.  . carvedilol (COREG) 3.125 MG tablet Take 1 tablet (3.125 mg total) by mouth 2 (two) times daily with a meal.  . cholecalciferol (VITAMIN D) 1000 units tablet Take 1,000 Units by mouth 2 (two) times daily.  . diphenhydramine-acetaminophen (TYLENOL PM) 25-500 MG TABS Take 1 tablet by mouth at bedtime as needed (for pain /sleep).  . furosemide (LASIX) 40 MG tablet Take 1 tablet (40 mg total) by mouth daily.  Marland Kitchen HYDROcodone-acetaminophen (NORCO) 7.5-325 MG per tablet Take 1-2 tablets by mouth every 4 (four) hours as needed for moderate pain.  Marland Kitchen lisinopril (PRINIVIL) 10 MG tablet Take 1 tablet (10 mg total) by mouth daily.  . metFORMIN (GLUCOPHAGE) 1000 MG tablet TAKE ONE TABLET BY MOUTH TWICE DAILY  . metFORMIN (GLUCOPHAGE) 1000 MG tablet TAKE ONE TABLET BY MOUTH TWICE DAILY  . methocarbamol (ROBAXIN) 500 MG tablet Take 500 mg by mouth every 6 (six) hours as needed for muscle spasms. Reported on 07/26/2015  . pramipexole (MIRAPEX) 1 MG tablet TAKE ONE TABLET BY MOUTH TWICE DAILY  . Pyridoxine HCl (VITAMIN B-6 PO) Take by mouth.  . sitaGLIPtin (JANUVIA) 100 MG tablet Take 1 tablet (100 mg total) by mouth daily.   No facility-administered encounter medications on file as of 09/25/2015.    Functional Status:   In your present state of health, do you have any difficulty performing the following activities: 07/26/2015 07/16/2015  Hearing? - Y  Vision? - N  Difficulty  concentrating or making decisions? - N  Walking or climbing stairs? - Y  Dressing or bathing? - N  Doing errands, shopping? - Y  Preparing Food and eating ? Y -  Using the Toilet? N -  In the past six months, have you accidently leaked urine? Y -  Do you have problems with loss of bowel control? Y -  Managing your Medications? N -  Managing your Finances? N -  Housekeeping or managing your Housekeeping? N -    Fall/Depression Screening:    PHQ 2/9 Scores 08/01/2015 07/16/2015  04/18/2015 01/30/2015 10/11/2013 02/01/2013 06/20/2012  PHQ - 2 Score _0 0 0 1 0  PHQ- 9 Score 14 - - - - - -   Fall Risk  09/25/2015 07/24/2015 07/16/2015 04/18/2015 01/30/2015  Falls in the past year? Yes No No No No  Number falls in past yr: 1 - - - -  Risk for fall due to : History of fall(s);Impaired balance/gait - History of fall(s);Impaired balance/gait;Impaired mobility;Medication side effect - -  Follow up Falls evaluation completed;Education provided;Falls prevention discussed - - - -     Assessment:  CHF                         OA                         DM  Plan:  Closing case today, goals met.  THN CM Care Plan Problem One        Most Recent Value   THN CM Short Term Goal #3 (0-30 days)  Pt will read Heart Failure book by my next visit.   THN CM Short Term Goal #3 Start Date  07/26/15   [facility] CM Short Term Goal #3 Met Date  08/28/15   Interventions for Short Tern Goal #3  Pt praised for following recommendations for self care.    THN CM Care Plan Problem Two        Most Recent Value   Care Plan Problem Two  Uncontrolled OA pain with inflammation.   Role Documenting the Problem Two  Care Management Coordinator   Care Plan for Problem Two  Active   THN CM Short Term Goal #1 (0-30 days)  Pt to use ice pack on L wrist and R ankle bid until pain is improved.   THN CM Short Term Goal #1 Start Date  08/28/15   THN CM Short Term Goal #1 Met Date   09/25/15   THN CM Short Term Goal #2 (0-30 days)  Pt to take Rx for next 6 days. [Pt will take prednisone as directed for 6 days.]   THN CM Short Term Goal #2 Start Date  08/28/15   [facility] CM Short Term Goal #2 Met Date  09/25/15     Deloria Lair [facility] [place] [phone]

## 2015-10-15 ENCOUNTER — Encounter: Payer: Self-pay | Admitting: Hematology and Oncology

## 2015-10-15 ENCOUNTER — Ambulatory Visit (HOSPITAL_BASED_OUTPATIENT_CLINIC_OR_DEPARTMENT_OTHER): Payer: Commercial Managed Care - HMO | Admitting: Hematology and Oncology

## 2015-10-15 ENCOUNTER — Telehealth: Payer: Self-pay | Admitting: Hematology and Oncology

## 2015-10-15 VITALS — BP 177/83 | HR 81 | Temp 98.1°F | Resp 18 | Wt 189.5 lb

## 2015-10-15 DIAGNOSIS — C50411 Malignant neoplasm of upper-outer quadrant of right female breast: Secondary | ICD-10-CM

## 2015-10-15 DIAGNOSIS — M8580 Other specified disorders of bone density and structure, unspecified site: Secondary | ICD-10-CM

## 2015-10-15 NOTE — Assessment & Plan Note (Signed)
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 She has been on antiestrogen therapy with Arimidex 1 mg daily since January 2012 I recommended extended adjuvant therapy for 10 years  Arimidex toxicities: 1. Bone density 04/08/2015: T score -1.4 mild osteopenia. 2. Denies any hot flashes or myalgias.  Breast cancer surveillance: 1. Breast exam 10/15/2015 is normal 2. Mammogram 04/08/2015 was normal  Return to clinic in 1 year for follow-up

## 2015-10-15 NOTE — Telephone Encounter (Signed)
appt made and avs printed °

## 2015-10-15 NOTE — Progress Notes (Signed)
Patient Care Team: [name], DO as PCP - General (Family Medicine) Suella Broad, MD as Consulting Physician (Physical Medicine and Rehabilitation)  SUMMARY OF ONCOLOGIC HISTORY:   Breast cancer of upper-outer quadrant of right female breast ([place])   03/12/2010 Initial Diagnosis Invasive lobular cancer, ER 100%, PR 88% Ki-67 13%, HER-2 negative ratio 1.3   04/23/2010 Surgery Right breast lumpectomy invasive ductal carcinoma grade 1, 1 cm size; one sentinel lymph node negative for malignancy   04/28/2010 - 05/05/2010 Radiation Therapy Accelerated partial breast radiation with MammoSite   05/27/2010 -  Anti-estrogen oral therapy Arimidex 1 mg daily    CHIEF COMPLIANT: Follow-up on Arimidex  INTERVAL HISTORY: [name] is a 75 year old with above-mentioned history of right breast cancer currently on Arimidex therapy. She appears to be tolerating it fairly well. She does not have any hot flashes or myalgias. Her bone density test in November 2016 revealed that the T score is -1.4. Her health overall has been fairly good.  REVIEW OF SYSTEMS:   Constitutional: Denies fevers, chills or abnormal weight loss Eyes: Denies blurriness of vision Ears, nose, mouth, throat, and face: Denies mucositis or sore throat Respiratory: Denies cough, dyspnea or wheezes Cardiovascular: Denies palpitation, chest discomfort Gastrointestinal:  Denies nausea, heartburn or change in bowel habits Skin: Denies abnormal skin rashes Lymphatics: Denies new lymphadenopathy or easy bruising Neurological:Denies numbness, tingling or new weaknesses Behavioral/Psych: Mood is stable, no new changes  Extremities: No lower extremity edema Breast:  denies any pain or lumps or nodules in either breasts All other systems were reviewed with the patient and are negative.  I have reviewed the past medical history, past surgical history, social history and family history with the patient and they are unchanged from previous  note.  ALLERGIES:  is allergic to sulfonamide derivatives and other.  MEDICATIONS:  Current Outpatient Prescriptions  Medication Sig Dispense Refill  . anastrozole (ARIMIDEX) 1 MG tablet TAKE ONE TABLET BY MOUTH ONCE DAILY 30 tablet 4  . canagliflozin (INVOKANA) 100 MG TABS tablet Take 1 tablet (100 mg total) by mouth daily before breakfast. 30 tablet 3  . carvedilol (COREG) 3.125 MG tablet Take 1 tablet (3.125 mg total) by mouth 2 (two) times daily with a meal. 60 tablet 5  . cholecalciferol (VITAMIN D) 1000 units tablet Take 1,000 Units by mouth 2 (two) times daily.    . diphenhydramine-acetaminophen (TYLENOL PM) 25-500 MG TABS Take 1 tablet by mouth at bedtime as needed (for pain /sleep).    . furosemide (LASIX) 40 MG tablet Take 1 tablet (40 mg total) by mouth daily. 30 tablet 5  . HYDROcodone-acetaminophen (NORCO) 7.5-325 MG per tablet Take 1-2 tablets by mouth every 4 (four) hours as needed for moderate pain. (Patient not taking: Reported on 09/25/2015) 90 tablet 0  . lisinopril (PRINIVIL) 10 MG tablet Take 1 tablet (10 mg total) by mouth daily. 30 tablet 5  . metFORMIN (GLUCOPHAGE) 1000 MG tablet TAKE ONE TABLET BY MOUTH TWICE DAILY 180 tablet 2  . methocarbamol (ROBAXIN) 500 MG tablet Take 500 mg by mouth every 6 (six) hours as needed for muscle spasms. Reported on 09/25/2015    . pramipexole (MIRAPEX) 1 MG tablet TAKE ONE TABLET BY MOUTH TWICE DAILY 180 tablet 1  . Pyridoxine HCl (VITAMIN B-6 PO) Take by mouth.    . sitaGLIPtin (JANUVIA) 100 MG tablet Take 1 tablet (100 mg total) by mouth daily. 90 tablet 1   No current facility-administered medications for this visit.  PHYSICAL EXAMINATION: ECOG PERFORMANCE STATUS: 1 - Symptomatic but completely ambulatory  Filed Vitals:   10/15/15 0928  BP: 177/83  Pulse: 81  Temp: 98.1 F (36.7 C)  Resp: 18   Filed Weights   10/15/15 0928  Weight: 189 lb 8 oz (85.957 kg)    GENERAL:alert, no distress and comfortable SKIN: skin  color, texture, turgor are normal, no rashes or significant lesions EYES: normal, Conjunctiva are pink and non-injected, sclera clear OROPHARYNX:no exudate, no erythema and lips, buccal mucosa, and tongue normal  NECK: supple, thyroid normal size, non-tender, without nodularity LYMPH:  no palpable lymphadenopathy in the cervical, axillary or inguinal LUNGS: clear to auscultation and percussion with normal breathing effort HEART: regular rate & rhythm and no murmurs and no lower extremity edema ABDOMEN:abdomen soft, non-tender and normal bowel sounds MUSCULOSKELETAL:no cyanosis of digits and no clubbing  NEURO: alert & oriented x 3 with fluent speech, no focal motor/sensory deficits EXTREMITIES: No lower extremity edema BREAST: No palpable masses or nodules in either right or left breasts. No palpable axillary supraclavicular or infraclavicular adenopathy no breast tenderness or nipple discharge. (exam performed in the presence of a chaperone)  LABORATORY DATA:  I have reviewed the data as listed   Chemistry      Component Value Date/Time   NA 135 07/15/2015 1423   NA 139 01/25/2014 0959   K 4.1 07/15/2015 1423   K 3.8 01/25/2014 0959   CL 94* 07/15/2015 1423   CL 99 10/06/2012 0855   CO2 31 07/15/2015 1423   CO2 27 01/25/2014 0959   BUN 15 07/15/2015 1423   BUN 13.6 01/25/2014 0959   CREATININE 0.58 07/15/2015 1423   CREATININE 0.8 01/25/2014 0959   CREATININE 0.90 10/03/2010 1635      Component Value Date/Time   CALCIUM 9.5 07/15/2015 1423   CALCIUM 9.3 01/25/2014 0959   ALKPHOS 84 07/03/2015 0010   ALKPHOS 97 01/25/2014 0959   AST 18 07/03/2015 0010   AST 18 01/25/2014 0959   ALT 16 07/03/2015 0010   ALT 18 01/25/2014 0959   BILITOT 0.8 07/03/2015 0010   BILITOT 0.30 01/25/2014 0959       Lab Results  Component Value Date   WBC 13.9* 07/03/2015   HGB 13.9 07/03/2015   HCT 41.7 07/03/2015   MCV 89.7 07/03/2015   PLT 252 07/03/2015   NEUTROABS 11.2* 07/03/2015      ASSESSMENT & PLAN:  Breast cancer of upper-outer quadrant of right female breast ([place]) 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 She has been on antiestrogen therapy with Arimidex 1 mg daily since January 2012 I recommended extended adjuvant therapy for 10 years  Arimidex toxicities: 1. Bone density 04/08/2015: T score -1.4 mild osteopenia. 2. Denies any hot flashes or myalgias.  Breast cancer surveillance: 1. Breast exam 10/15/2015 is normal 2. Mammogram 04/08/2015 was normal  Patient spends summers in [place] and the rest of the year she spends in [place]. Return to clinic in 1 year for follow-up   No orders of the defined types were placed in this encounter.   The patient has a good understanding of the overall plan. she agrees with it. she will call with any problems that may develop before the next visit here.   Rulon Eisenmenger, MD 10/15/2015

## 2015-10-21 ENCOUNTER — Encounter: Payer: Self-pay | Admitting: Family Medicine

## 2015-10-21 ENCOUNTER — Ambulatory Visit (INDEPENDENT_AMBULATORY_CARE_PROVIDER_SITE_OTHER): Payer: Commercial Managed Care - HMO | Admitting: Family Medicine

## 2015-10-21 ENCOUNTER — Telehealth: Payer: Self-pay | Admitting: Cardiology

## 2015-10-21 VITALS — BP 132/80 | HR 78 | Temp 98.3°F | Ht 61.0 in | Wt 190.4 lb

## 2015-10-21 DIAGNOSIS — I10 Essential (primary) hypertension: Secondary | ICD-10-CM | POA: Diagnosis not present

## 2015-10-21 DIAGNOSIS — Z23 Encounter for immunization: Secondary | ICD-10-CM | POA: Diagnosis not present

## 2015-10-21 DIAGNOSIS — I5032 Chronic diastolic (congestive) heart failure: Secondary | ICD-10-CM

## 2015-10-21 DIAGNOSIS — E1140 Type 2 diabetes mellitus with diabetic neuropathy, unspecified: Secondary | ICD-10-CM | POA: Diagnosis not present

## 2015-10-21 DIAGNOSIS — M949 Disorder of cartilage, unspecified: Secondary | ICD-10-CM

## 2015-10-21 DIAGNOSIS — F330 Major depressive disorder, recurrent, mild: Secondary | ICD-10-CM

## 2015-10-21 DIAGNOSIS — M899 Disorder of bone, unspecified: Secondary | ICD-10-CM | POA: Diagnosis not present

## 2015-10-21 LAB — LIPID PANEL
CHOL/HDL RATIO: 4
Cholesterol: 131 mg/dL (ref 0–200)
HDL: 31 mg/dL — ABNORMAL LOW (ref >39.00)
LDL Cholesterol: 83 mg/dL (ref 0–99)
NONHDL: 100.28
Triglycerides: 86 mg/dL (ref 0.0–149.0)
VLDL: 17.2 mg/dL (ref 0.0–40.0)

## 2015-10-21 LAB — BASIC METABOLIC PANEL
BUN: 18 mg/dL (ref 6–23)
CALCIUM: 9.3 mg/dL (ref 8.4–10.5)
CO2: 30 mEq/L (ref 19–32)
Chloride: 99 mEq/L (ref 96–112)
Creatinine, Ser: 0.55 mg/dL (ref 0.40–1.20)
GFR: 114.04 mL/min (ref >60.00)
Glucose, Bld: 140 mg/dL — ABNORMAL HIGH (ref 70–99)
Potassium: 3.3 mEq/L — ABNORMAL LOW (ref 3.5–5.1)
SODIUM: 137 meq/L (ref 135–145)

## 2015-10-21 LAB — HEMOGLOBIN A1C: Hgb A1c MFr Bld: 7.5 % — ABNORMAL HIGH (ref 4.6–6.5)

## 2015-10-21 NOTE — Progress Notes (Signed)
Pre visit review using our clinic review tool, if applicable. No additional management support is needed unless otherwise documented below in the visit note. 

## 2015-10-21 NOTE — Addendum Note (Signed)
Addended by: Agnes Lawrence on: 10/21/2015 11:43 AM   Modules accepted: Orders

## 2015-10-21 NOTE — Progress Notes (Signed)
HPI:  HTN/CdCHF/LE edema: -saw Dr. Warren Lacy after hospitalization fo CHF in 2017 -meds: lisinopril 10, lasix 40, coreg 3.125 -Hx OSA, did not tolerate CPAP  DM: -meds: invokana, metformin, januvia -home BS: 130-160 fasting -denies: polyuria, polyuria, vision changes, wounds  MDD: -mild, chronic -declined pharmacological intervention, considering cbt -stable  RLS: -meds: mirapex  Chronic pain: -multible muscles and joints -doing better   ROS: See pertinent positives and negatives per HPI.  Past Medical History  Diagnosis Date  . Hypertension   . Diabetes ([place])   . Diastolic CHF ([place])     Dx w/ hospitalization 06/2015 for respiratory failure  . Sleep apnea   . RLS (restless legs syndrome)   . MDD (major depressive disorder) ([place])   . No blood products ([place])  09/11/2013  . RESTLESS LEGS SYNDROME 05/05/2007    Qualifier: Diagnosis of  By: Gwenette Greet MD, Armando Reichert   . History of environmental allergies   . Breast cancer ([place])     R breast, s/p radiation 34Gy/13fx 04/29/10-05/05/10  . Anxiety     occasional  . History of kidney stones 20 years ago  . Falls frequently     "can not pick left leg up"  . Osteoarthritis     hx shoulder, knee, back, wrist pain; saw Dr. Wynelle Link   . Hearing loss     bilateral hearing aides  . AK (actinic keratosis)   . Foot fracture   . CTS (carpal tunnel syndrome)     Left    Past Surgical History  Procedure Laterality Date  . Flex signoidoscopy    . Incision and drainage perirectal abscess    . Tubal ligation  1979  . Breast surgery  2012    rt breast lumpectomy  . Appendectomy  age 89  . Joint replacement Right 2006    knee  . 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 Cancer Sister     breast  . Heart disease Paternal Grandfather   .  Diabetes Other   . Obesity Other   . Heart disease Brother     Valve  . Alcohol abuse Father     Social History   Social History  . Marital Status: Married    Spouse Name: N/A  . Number of Children: 3  . Years of Education: N/A   Social History Main Topics  . Smoking status: Never Smoker   . Smokeless tobacco: Never Used  . Alcohol Use: No  . Drug Use: No  . Sexual Activity: Yes   Other Topics Concern  . None   Social History Narrative         Home Situation: lives with husband and granddaughter      Spiritual Beliefs: Jehovah Witness - no blood products                    Current outpatient prescriptions:  .  anastrozole (ARIMIDEX) 1 MG tablet, TAKE ONE TABLET BY MOUTH ONCE DAILY, Disp: 30 tablet, Rfl: 4 .  canagliflozin (INVOKANA) 100 MG TABS tablet, Take 1 tablet (100 mg total) by mouth daily before breakfast., Disp: 30 tablet, Rfl: 3 .  carvedilol (COREG) 3.125 MG tablet, Take 1 tablet (3.125 mg total) by mouth 2 (two) times daily with a meal., Disp: 60 tablet, Rfl:  5 .  cholecalciferol (VITAMIN D) 1000 units tablet, Take 1,000 Units by mouth 2 (two) times daily., Disp: , Rfl:  .  diphenhydramine-acetaminophen (TYLENOL PM) 25-500 MG TABS, Take 1 tablet by mouth at bedtime as needed (for pain /sleep)., Disp: , Rfl:  .  furosemide (LASIX) 40 MG tablet, Take 1 tablet (40 mg total) by mouth daily., Disp: 30 tablet, Rfl: 5 .  HYDROcodone-acetaminophen (NORCO) 7.5-325 MG per tablet, Take 1-2 tablets by mouth every 4 (four) hours as needed for moderate pain., Disp: 90 tablet, Rfl: 0 .  lisinopril (PRINIVIL) 10 MG tablet, Take 1 tablet (10 mg total) by mouth daily., Disp: 30 tablet, Rfl: 5 .  metFORMIN (GLUCOPHAGE) 1000 MG tablet, TAKE ONE TABLET BY MOUTH TWICE DAILY, Disp: 180 tablet, Rfl: 2 .  methocarbamol (ROBAXIN) 500 MG tablet, Take 500 mg by mouth every 6 (six) hours as needed for muscle spasms. Reported on 09/25/2015, Disp: , Rfl:  .  pramipexole (MIRAPEX) 1 MG  tablet, TAKE ONE TABLET BY MOUTH TWICE DAILY, Disp: 180 tablet, Rfl: 1 .  Pyridoxine HCl (VITAMIN B-6 PO), Take by mouth., Disp: , Rfl:  .  sitaGLIPtin (JANUVIA) 100 MG tablet, Take 1 tablet (100 mg total) by mouth daily., Disp: 90 tablet, Rfl: 1  EXAM:  Filed Vitals:   10/21/15 1021  BP: 132/80  Pulse: 78  Temp: 98.3 F (36.8 C)    Body mass index is 35.99 kg/(m^2).  GENERAL: vitals reviewed and listed above, alert, oriented, appears well hydrated and in no acute distress  HEENT: atraumatic, conjunttiva clear, no obvious abnormalities on inspection of external nose and ears  NECK: no obvious masses on inspection  LUNGS: clear to auscultation bilaterally, no wheezes, rales or rhonchi, good air movement  CV: HRRR, tr peripheral edema, using compression socks  MS: moves all extremities without noticeable abnormality  PSYCH: pleasant and cooperative, no obvious depression or anxiety  ASSESSMENT AND PLAN:  Discussed the following assessment and plan:  Type 2 diabetes mellitus with diabetic neuropathy, without long-term current use of insulin ([place]) - Plan: Basic metabolic panel, Lipid panel, Hemoglobin A1c  Disorder of bone and cartilage  Essential hypertension - Plan: Basic metabolic panel  Chronic diastolic congestive heart failure (HCC)  Mild episode of recurrent major depressive disorder (HCC)  -lifestyle recs discussed at length with recommendation for small healthy low carb meals, avoidance of processed foods and easing into regular gentle exercise - starting in the pool (they have a pool membership) -if diabetes not at goal we plan to do metformin and basil insulin with addition of mealtime insulin if needed after discussion options -FASTING labs today -follow up in 3-4 months -Patient advised to return or notify a doctor immediately if symptoms worsen or persist or new concerns arise.  Patient Instructions  BEFORE YOU LEAVE: -tetanus booster -labs -follow up  in 3-4 months  We recommend the following healthy lifestyle measures: - eat a healthy whole foods diet consisting of regular small meals composed of vegetables, fruits, beans, nuts, seeds, healthy meats such as white chicken and fish  - avoid sweets, white starchy foods/hidden sugars (corn, rice, bread, potatoes, chips, pretzels, canned or dried fruit, fruit juice, etc), fried foods, fast food, processed foods, sodas, red meet and other fattening foods.  - get a least 150-300 minutes of aerobic exercise per week.   We have ordered labs or studies at this visit. It can take up to 1-2 weeks for results and processing. IF results require follow up or  explanation, we will call you with instructions. Clinically stable results will be released to your [facility]. If you have not heard from [place] or cannot find your results in [facility] in 2 weeks please contact our office at [phone].  If you are not yet signed up for [facility], please consider signing up.            Colin Benton R.

## 2015-10-21 NOTE — Patient Instructions (Addendum)
BEFORE YOU LEAVE: -tetanus booster -labs -follow up in 3-4 months  We recommend the following healthy lifestyle measures: - eat a healthy whole foods diet consisting of regular small meals composed of vegetables, fruits, beans, nuts, seeds, healthy meats such as white chicken and fish  - avoid sweets, white starchy foods/hidden sugars (corn, rice, bread, potatoes, chips, pretzels, canned or dried fruit, fruit juice, etc), fried foods, fast food, processed foods, sodas, red meet and other fattening foods.  - get a least 150-300 minutes of aerobic exercise per week.   We have ordered labs or studies at this visit. It can take up to 1-2 weeks for results and processing. IF results require follow up or explanation, we will call you with instructions. Clinically stable results will be released to your [facility]. If you have not heard from [place] or cannot find your results in [facility] in 2 weeks please contact our office at [phone].  If you are not yet signed up for [facility], please consider signing up.

## 2015-10-22 ENCOUNTER — Encounter: Payer: Self-pay | Admitting: Cardiology

## 2015-10-22 ENCOUNTER — Ambulatory Visit (INDEPENDENT_AMBULATORY_CARE_PROVIDER_SITE_OTHER): Payer: Commercial Managed Care - HMO | Admitting: Cardiology

## 2015-10-22 VITALS — BP 174/91 | HR 84 | Ht 61.5 in | Wt 190.8 lb

## 2015-10-22 DIAGNOSIS — Z79899 Other long term (current) drug therapy: Secondary | ICD-10-CM | POA: Diagnosis not present

## 2015-10-22 MED ORDER — POTASSIUM CHLORIDE ER 10 MEQ PO TBCR: 10.0000 meq | EXTENDED_RELEASE_TABLET | Freq: Every day | ORAL | Status: DC

## 2015-10-22 MED ORDER — LISINOPRIL 20 MG PO TABS: 10.0000 mg | ORAL_TABLET | Freq: Every day | ORAL | Status: DC

## 2015-10-22 NOTE — Patient Instructions (Signed)
Medication Instructions:  START Potassium 10 meq take 2 tablets today then 1 tablets daily and INCREASE lisinopril 20 mg daily  Labwork: BMP 2 weeks  Testing/Procedures: NONE  Follow-Up: Your physician wants you to follow-up in: 6 Months. You will receive a reminder letter in the mail two months in advance. If you don't receive a letter, please call our office to schedule the follow-up appointment.   Any Other Special Instructions Will Be Listed Below (If Applicable).   If you need a refill on your cardiac medications before your next appointment, please call your pharmacy.

## 2015-10-22 NOTE — Progress Notes (Signed)
Cardiology Office Note   Date:  10/22/2015   ID:  [name], DOB 1940-07-13, MRN [id number]  PCP:  [name]., DO  Cardiologist:   Minus Breeding, MD   Chief Complaint  Patient presents with  . Shortness of Breath      History of Present Illness: [name] is a 75 y.o. female who presents for follow-up after recent hospitalization.  She was in the hospital with shortness of breath. This had accumulated slowly with increased weight. She had some edema. Her dose of diuretic had been reduced because she's been urinating quite a bit. In the hospital she had an echocardiogram which demonstrated no significant valvular abnormalities and a well preserved ejection fraction. She was diuresed and was 197 pounds on going home At the first follow up visit she weighed 190 pounds.  At that appointment in April I made no medication changes.    She returns for follow up.  She is breathing better.  She denies any new SOB.  She has trace edema.    Past Medical History  Diagnosis Date  . Hypertension   . Diabetes ([place])   . Diastolic CHF ([place])     Dx w/ hospitalization 06/2015 for respiratory failure  . Sleep apnea   . RLS (restless legs syndrome)   . MDD (major depressive disorder) ([place])   . No blood products ([place])  09/11/2013  . RESTLESS LEGS SYNDROME 05/05/2007    Qualifier: Diagnosis of  By: Gwenette Greet MD, Armando Reichert   . History of environmental allergies   . Breast cancer ([place])     R breast, s/p radiation 34Gy/86fx 04/29/10-05/05/10  . Anxiety     occasional  . History of kidney stones 20 years ago  . Falls frequently     "can not pick left leg up"  . Osteoarthritis     hx shoulder, knee, back, wrist pain; saw Dr. Wynelle Link   . Hearing loss     bilateral hearing aides  . AK (actinic keratosis)   . Foot fracture   . CTS (carpal tunnel syndrome)     Left    Past Surgical History  Procedure Laterality Date  . Flex signoidoscopy    . Incision and drainage perirectal  abscess    . Tubal ligation  1979  . Breast surgery  2012    rt breast lumpectomy  . Appendectomy  age 9  . Joint replacement Right 2006    knee  . Total knee arthroplasty Left 08/06/2014    Procedure: LEFT TOTAL KNEE ARTHROPLASTY;  Surgeon: Gaynelle Arabian, MD;  Location: [facility];  Service: Orthopedics;  Laterality: Left;     Current Outpatient Prescriptions  Medication Sig Dispense Refill  . anastrozole (ARIMIDEX) 1 MG tablet TAKE ONE TABLET BY MOUTH ONCE DAILY 30 tablet 4  . canagliflozin (INVOKANA) 100 MG TABS tablet Take 1 tablet (100 mg total) by mouth daily before breakfast. 30 tablet 3  . carvedilol (COREG) 3.125 MG tablet Take 1 tablet (3.125 mg total) by mouth 2 (two) times daily with a meal. 60 tablet 5  . cholecalciferol (VITAMIN D) 1000 units tablet Take 1,000 Units by mouth 2 (two) times daily.    . diphenhydramine-acetaminophen (TYLENOL PM) 25-500 MG TABS Take 1 tablet by mouth at bedtime as needed (for pain /sleep).    . furosemide (LASIX) 40 MG tablet Take 1 tablet (40 mg total) by mouth daily. 30 tablet 5  . HYDROcodone-acetaminophen (NORCO) 7.5-325 MG per tablet  Take 1-2 tablets by mouth every 4 (four) hours as needed for moderate pain. 90 tablet 0  . lisinopril (PRINIVIL,ZESTRIL) 20 MG tablet Take 0.5 tablets (10 mg total) by mouth daily. 30 tablet 11  . metFORMIN (GLUCOPHAGE) 1000 MG tablet TAKE ONE TABLET BY MOUTH TWICE DAILY 180 tablet 2  . methocarbamol (ROBAXIN) 500 MG tablet Take 500 mg by mouth every 6 (six) hours as needed for muscle spasms. Reported on 09/25/2015    . pramipexole (MIRAPEX) 1 MG tablet TAKE ONE TABLET BY MOUTH TWICE DAILY 180 tablet 1  . Pyridoxine HCl (VITAMIN B-6 PO) Take by mouth.    . sitaGLIPtin (JANUVIA) 100 MG tablet Take 1 tablet (100 mg total) by mouth daily. 90 tablet 1  . potassium chloride (K-DUR) 10 MEQ tablet Take 1 tablet (10 mEq total) by mouth daily. 30 tablet 11   No current facility-administered medications for this visit.     Allergies:   Sulfonamide derivatives and Other    ROS:  Please see the history of present illness.   Otherwise, review of systems are positive for none.   All other systems are reviewed and negative.    PHYSICAL EXAM: VS:  BP 174/91 mmHg  Pulse 84  Ht 5' 1.5" (1.562 m)  Wt 190 lb 12.8 oz (86.546 kg)  BMI 35.47 kg/m2 , BMI Body mass index is 35.47 kg/(m^2). GENERAL:  Well appearing HEENT:  Pupils equal round and reactive, fundi not visualized, oral mucosa unremarkable NECK:  No jugular venous distention, waveform within normal limits, carotid upstroke brisk and symmetric, no bruits, no thyromegaly LUNGS:  Clear to auscultation bilaterally CHEST:  Unremarkable HEART:  PMI not displaced or sustained,S1 and S2 within normal limits, no S3, no S4, no clicks, no rubs, no murmurs ABD:  Flat, positive bowel sounds normal in frequency in pitch, no bruits, no rebound, no guarding, no midline pulsatile mass, no hepatomegaly, no splenomegaly EXT:  2 plus pulses throughout, no edema, no cyanosis no clubbing    EKG:  EKG is not ordered today.    Recent Labs: 07/02/2015: B Natriuretic Peptide 20.4 07/03/2015: ALT 16; Hemoglobin 13.9; Platelets 252 07/07/2015: Magnesium 1.9 10/21/2015: BUN 18; Creatinine, Ser 0.55; Potassium 3.3*; Sodium 137    Lipid Panel    Component Value Date/Time   CHOL 131 10/21/2015 1113   TRIG 86.0 10/21/2015 1113   HDL 31.00* 10/21/2015 1113   CHOLHDL 4 10/21/2015 1113   VLDL 17.2 10/21/2015 1113   LDLCALC 83 10/21/2015 1113   LDLDIRECT 111.6 02/14/2010 1406      Wt Readings from Last 3 Encounters:  10/22/15 190 lb 12.8 oz (86.546 kg)  10/21/15 190 lb 6.4 oz (86.365 kg)  10/15/15 189 lb 8 oz (85.957 kg)      Other studies Reviewed: Additional studies/ records that were reviewed today include:  Hospital records Review of the above records demonstrates:  Please see elsewhere in the note.  See elsewhere   ASSESSMENT AND PLAN:  CHF:  She seems to be  euvolemic.  No change in therapy   DM:  I do note that her last hemoglobin A1c was 7.5.  This was slightly better than previous.  This is followed by [name]., DO  HTN:  The blood pressure is elevated and has been a multiple readings.  I will increase the Lisinopril to 20 mg daily.  HYPOKALEMIA:  I will start 10 meq KDUR daily and check a BMET in two weeks.     Current medicines  are reviewed at length with the patient today.  The patient does not have concerns regarding medicines.  The following changes have been made:  As above.  Labs/ tests ordered today include:    Orders Placed This Encounter  Procedures  . Basic Metabolic Panel (BMET)     Disposition:   FU with me in six months.      Signed, Minus Breeding, MD  10/22/2015 11:42 AM    [facility]

## 2015-10-23 ENCOUNTER — Telehealth: Payer: Self-pay

## 2015-10-23 NOTE — Telephone Encounter (Signed)
-----   Message from [name], DO sent at 10/22/2015  8:42 PM EDT ----- Diabetes labs still not at goal. As we discussed, lets stop the [place] and/or invokana given cost issues and start insulin. Can stop both if she wishes as we discussed or the one that is most expensive per her preference. Continue metformin. Please set up nurse visit to teach how to use insulin. Start basaglar 8 units once daily. Have pt check fasting BS daily and call in 3 days with results.

## 2015-10-23 NOTE — Telephone Encounter (Signed)
Closed Encounter  °

## 2015-10-23 NOTE — Telephone Encounter (Signed)
Called patient. Went straight to vmail.

## 2015-10-24 ENCOUNTER — Other Ambulatory Visit: Payer: Self-pay | Admitting: Hematology and Oncology

## 2015-10-25 ENCOUNTER — Other Ambulatory Visit: Payer: Self-pay | Admitting: *Deleted

## 2015-10-25 ENCOUNTER — Ambulatory Visit: Payer: Commercial Managed Care - HMO | Admitting: *Deleted

## 2015-10-25 DIAGNOSIS — E1140 Type 2 diabetes mellitus with diabetic neuropathy, unspecified: Secondary | ICD-10-CM

## 2015-10-25 MED ORDER — BASAGLAR KWIKPEN 100 UNIT/ML ~~LOC~~ SOPN: PEN_INJECTOR | SUBCUTANEOUS | Status: DC

## 2015-10-25 MED ORDER — INSULIN PEN NEEDLE 32G X 4 MM MISC: Status: DC

## 2015-10-25 NOTE — Progress Notes (Signed)
Patient came in today for nurse visit to learn how to use basaglar insulin pen:  -Review with patient the mechanics of the pen  -How to turn the knob to show the '8 units' she is prescribed to give daily -How to apply the needles, and how to administer the injection  -Needle safety, and subcutanous injection technique.   -Used the teach-back method to confirm patient could self-administer injections -Reviewed Dr. Julianne Rice lab notes reflecting to stop other two medications, continue metformin, start with Basaglar, and to call back with 3 days of glucose fingerstick readings (Tuesday)  -Signs and symptoms of hypo and hyperglycemia were discussed. Did tell patient if she starts to feel different or not well, regardless of symptoms, to let Dr. Maudie Mercury know right away. -Patient went home with sample supply of medication

## 2015-10-25 NOTE — Telephone Encounter (Signed)
Rx done. 

## 2015-10-28 ENCOUNTER — Telehealth: Payer: Self-pay | Admitting: Family Medicine

## 2015-10-28 NOTE — Telephone Encounter (Signed)
[name], can you please check with her pharmacy? That does not seem right. I am sure there is a basil insulin option for cheaper. Also could check at [facility] and [facility] for basil insulin options and cost? And, ask the basaglar rep? Thanks!

## 2015-10-28 NOTE — Telephone Encounter (Signed)
Pt call to say that the following med cost her 350.00 week Insulin Glargine (BASAGLAR KWIKPEN) 100 UNIT/ML SOPN  They will cover the ones listed below but they cost the pt 150.00 week  And this does not work for the pt either    [place] , Tresiba ,

## 2015-10-28 NOTE — Telephone Encounter (Signed)
I called [facility] and spoke with [place] and she stated the Rx will cost $336.73 for a 3 month supply and she is not sure why [facility] is charging her this? Stated it could be the pts deductible and the computer does not give them a reason why?  Message forwarded to Dr Maudie Mercury.

## 2015-10-29 MED ORDER — BASAGLAR KWIKPEN 100 UNIT/ML ~~LOC~~ SOPN
PEN_INJECTOR | SUBCUTANEOUS | Status: DC
Start: 2015-10-29 — End: 2015-10-31

## 2015-10-29 NOTE — Addendum Note (Signed)
Addended by: Agnes Lawrence on: 10/29/2015 03:28 PM   Modules accepted: Orders

## 2015-10-29 NOTE — Telephone Encounter (Signed)
I called [facility] and the technician stated she cannot tell me a price as the Rx was to be sent in on the pt there or [name] in order to know the price.  I also spoke with Arby Barrette the rep for [employer] and she stated this can be expensive for patients that do not have commercial insurance.  Per Dr Maudie Mercury the Rx was sent to [name] to check to see if this is cheaper for the pt. I called the pt and left a detailed message with this information and also gave her the phone number to contact [facility] for information also at [phone].

## 2015-10-29 NOTE — Telephone Encounter (Signed)
[name], can we please check with [facility] pharmacy on the cost for her for basil insulin with her insurance and with [facility] as well. Thanks.

## 2015-10-30 ENCOUNTER — Telehealth: Payer: Self-pay | Admitting: *Deleted

## 2015-10-30 NOTE — Telephone Encounter (Signed)
Please advise 

## 2015-10-30 NOTE — Telephone Encounter (Signed)
Received fax from [facility], insurance will not cover [employer].... Try Lantus, Toujeo, Tresiba or Levemir.

## 2015-10-31 MED ORDER — INSULIN GLARGINE 100 UNIT/ML SOLOSTAR PEN: 8.0000 [IU] | PEN_INJECTOR | Freq: Every day | SUBCUTANEOUS | Status: DC

## 2015-10-31 NOTE — Telephone Encounter (Signed)
Try lantus. Thanks. Same dosing.

## 2015-10-31 NOTE — Telephone Encounter (Signed)
I left a message for the pt to return my call. 

## 2015-11-01 NOTE — Telephone Encounter (Signed)
I left a message for the pt to return my call. 

## 2015-11-05 DIAGNOSIS — Z79899 Other long term (current) drug therapy: Secondary | ICD-10-CM | POA: Diagnosis not present

## 2015-11-06 LAB — BASIC METABOLIC PANEL
BUN: 15 mg/dL (ref 7–25)
CALCIUM: 9.1 mg/dL (ref 8.6–10.4)
CHLORIDE: 98 mmol/L (ref 98–110)
CO2: 28 mmol/L (ref 20–31)
Creat: 0.58 mg/dL — ABNORMAL LOW (ref 0.60–0.93)
GLUCOSE: 154 mg/dL — AB (ref 65–99)
POTASSIUM: 3.6 mmol/L (ref 3.5–5.3)
SODIUM: 137 mmol/L (ref 135–146)

## 2015-11-14 ENCOUNTER — Encounter: Payer: Self-pay | Admitting: Family Medicine

## 2015-11-14 ENCOUNTER — Ambulatory Visit (INDEPENDENT_AMBULATORY_CARE_PROVIDER_SITE_OTHER): Payer: Commercial Managed Care - HMO | Admitting: Family Medicine

## 2015-11-14 VITALS — BP 132/82 | HR 80 | Temp 98.1°F | Ht 61.0 in | Wt 188.9 lb

## 2015-11-14 DIAGNOSIS — E1140 Type 2 diabetes mellitus with diabetic neuropathy, unspecified: Secondary | ICD-10-CM

## 2015-11-14 MED ORDER — INSULIN PEN NEEDLE 32G X 4 MM MISC: Status: DC

## 2015-11-14 MED FILL — UNIFINE PENTIPS 32GX5/32: 32G X 4 MM | 90 days supply | Qty: 100 | Fill #0

## 2015-11-14 MED FILL — LANTUS SOLOSTAR 100 UNITS/M: 100 | 56 days supply | Qty: 6 | Fill #0

## 2015-11-14 NOTE — Patient Instructions (Addendum)
BEFORE YOU LEAVE: -follow up 3 months  Let me know what you want to do: 1)insulin, metformin, januvia + continue to work on lifestyle changes (low sugar diet, regular exercise)  2)back to old regimen invokana, januvia, metformin + lifestyle changes (low sugar diet and regular aerobic exercise  FOR YOUR DIABETES:  []  Eat a healthy low carb diet (avoid sweets, sweet drinks, breads, potatoes, corn, rice, etc.) and ensure 3 small meals daily.  []  Get AT LEAST 150 minutes of cardiovascular exercise per week - 30 minutes per day is best of sustained sweaty exercise.  []  Take all of your medications every day as directed by your doctor. Call your doctor immediately if you have any questions about your medications or are running low.  []  Check your blood sugar often and when you feel unwell and keep a log to bring to all health appointments. FASTING: before you eat anything in the morning POSTPRANDIAL: 1-2 hours after a meal - randomly check after different meals  []  If any low blood sugars < 70, eat a snack and call your doctor immediately.  []  See an eye doctor every year and fax your diabetic eye exam to our office.  Fax: [phone]  []  Take good care of your feet and keep them soft and callus free. Check your feet daily and wear comfortable shoes. See your doctor immediately if you have any cuts, calluses or wounds on your feet.

## 2015-11-14 NOTE — Progress Notes (Signed)
Pre visit review using our clinic review tool, if applicable. No additional management support is needed unless otherwise documented below in the visit note. 

## 2015-11-14 NOTE — Progress Notes (Signed)
HPI:   Follow up diabetes medication issues: -has had trouble affording medications -current regimen metformin and januvia (she stopped invokana), she did not know she was to try the insulin from another pharmacy -invokana and [place] each cost >$50 per month -reports FBS 150s on average and unchanged after stopping invokana, does not check postprandial sugars -cant take glipizide, does not want to take actos category given heart issues -denies polyuria, polydipsia, vision changes, low blood sugars -diet not great, no a lot of regular exercise  ROS: See pertinent positives and negatives per HPI.  Past Medical History  Diagnosis Date  . Hypertension   . Diabetes ([place])   . Diastolic CHF ([place])     Dx w/ hospitalization 06/2015 for respiratory failure  . Sleep apnea   . RLS (restless legs syndrome)   . MDD (major depressive disorder) ([place])   . No blood products ([place])  09/11/2013  . RESTLESS LEGS SYNDROME 05/05/2007    Qualifier: Diagnosis of  By: Gwenette Greet MD, Armando Reichert   . History of environmental allergies   . Breast cancer ([place])     R breast, s/p radiation 34Gy/46fx 04/29/10-05/05/10  . Anxiety     occasional  . History of kidney stones 20 years ago  . Falls frequently     "can not pick left leg up"  . Osteoarthritis     hx shoulder, knee, back, wrist pain; saw Dr. Wynelle Link   . Hearing loss     bilateral hearing aides  . AK (actinic keratosis)   . Foot fracture   . CTS (carpal tunnel syndrome)     Left    Past Surgical History  Procedure Laterality Date  . Flex signoidoscopy    . Incision and drainage perirectal abscess    . Tubal ligation  1979  . Breast surgery  2012    rt breast lumpectomy  . Appendectomy  age 39  . Joint replacement Right 2006    knee  . 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 Cancer Sister     breast  . Heart disease Paternal Grandfather   . Diabetes Other   . Obesity Other   . Heart disease Brother     Valve  . Alcohol abuse Father     Social History   Social History  . Marital Status: Married    Spouse Name: N/A  . Number of Children: 3  . Years of Education: N/A   Social History Main Topics  . Smoking status: Never Smoker   . Smokeless tobacco: Never Used  . Alcohol Use: No  . Drug Use: No  . Sexual Activity: Yes   Other Topics Concern  . None   Social History Narrative         Home Situation: lives with husband and granddaughter      Spiritual Beliefs: Jehovah Witness - no blood products                    Current outpatient prescriptions:  .  anastrozole (ARIMIDEX) 1 MG tablet, TAKE ONE TABLET BY MOUTH ONCE DAILY, Disp: 30 tablet, Rfl: 0 .  carvedilol (COREG) 3.125 MG tablet, Take 1 tablet (3.125 mg total) by mouth 2 (two) times daily with a meal., Disp: 60 tablet, Rfl: 5 .  cholecalciferol (VITAMIN D) 1000 units tablet, Take 1,000 Units by mouth 2 (two) times daily., Disp: , Rfl:  .  diphenhydramine-acetaminophen (TYLENOL PM) 25-500 MG TABS, Take 1 tablet by mouth at bedtime as needed (for pain /sleep)., Disp: , Rfl:  .  furosemide (LASIX) 40 MG tablet, Take 1 tablet (40 mg total) by mouth daily., Disp: 30 tablet, Rfl: 5 .  HYDROcodone-acetaminophen (NORCO) 7.5-325 MG per tablet, Take 1-2 tablets by mouth every 4 (four) hours as needed for moderate pain., Disp: 90 tablet, Rfl: 0 .  Insulin Glargine (LANTUS SOLOSTAR) 100 UNIT/ML Solostar Pen, Inject 8 Units into the skin daily at 10 pm., Disp: 5 pen, Rfl: PRN .  Insulin Pen Needle (BD PEN NEEDLE NANO U/F) 32G X 4 MM MISC, Use as directed once a day, Disp: 100 each, Rfl: 1 .  lisinopril (PRINIVIL,ZESTRIL) 20 MG tablet, Take 0.5 tablets (10 mg total) by mouth daily., Disp: 30 tablet, Rfl: 11 .  metFORMIN (GLUCOPHAGE) 1000 MG tablet, TAKE ONE TABLET  BY MOUTH TWICE DAILY, Disp: 180 tablet, Rfl: 2 .  methocarbamol (ROBAXIN) 500 MG tablet, Take 500 mg by mouth every 6 (six) hours as needed for muscle spasms. Reported on 09/25/2015, Disp: , Rfl:  .  potassium chloride (K-DUR) 10 MEQ tablet, Take 1 tablet (10 mEq total) by mouth daily., Disp: 30 tablet, Rfl: 11 .  pramipexole (MIRAPEX) 1 MG tablet, TAKE ONE TABLET BY MOUTH TWICE DAILY, Disp: 180 tablet, Rfl: 1 .  Pyridoxine HCl (VITAMIN B-6 PO), Take by mouth., Disp: , Rfl:  .  sitaGLIPtin (JANUVIA) 100 MG tablet, Take 1 tablet (100 mg total) by mouth daily., Disp: 90 tablet, Rfl: 1  EXAM:  Filed Vitals:   11/14/15 0756  BP: 132/82  Pulse: 80  Temp: 98.1 F (36.7 C)    Body mass index is 35.71 kg/(m^2).  GENERAL: vitals reviewed and listed above, alert, oriented, appears well hydrated and in no acute distress  HEENT: atraumatic, conjunttiva clear, no obvious abnormalities on inspection of external nose and ears  NECK: no obvious masses on inspection  LUNGS: clear to auscultation bilaterally, no wheezes, rales or rhonchi, good air movement  CV: HRRR, no peripheral edema  MS: moves all extremities without noticeable abnormality  PSYCH: pleasant and cooperative, no obvious depression or anxiety  ASSESSMENT AND PLAN:  Discussed the following assessment and plan:  Type 2 diabetes mellitus with diabetic neuropathy, without long-term current use of insulin (HCC)  -considering the various options:  -she is to let me know what she wants to do -stressed importance lifestyle changes -advised checking fasting and random postprandial BS and keeping log -follow up 3 months -Patient advised to return or notify a doctor immediately if symptoms worsen or persist or new concerns arise.  Addendum: -basal insulin $75 for 75 days from [facility]. She opted to do insulin, januvia and metformin. Assistant to update med list and ensure she has rxs. Follow up 3 months.  Patient  Instructions  BEFORE YOU LEAVE: -follow up 3 months  Let me know what you want to do: 1)insulin, metformin, januvia + continue to work on lifestyle changes (low sugar diet, regular exercise)  2)back to old regimen invokana, januvia, metformin + lifestyle changes (low sugar diet and regular aerobic exercise  FOR YOUR DIABETES:  []  Eat a healthy low carb diet (avoid sweets, sweet drinks, breads, potatoes, corn, rice, etc.) and ensure 3 small meals daily.  []  Get AT LEAST 150 minutes of cardiovascular exercise per week -  30 minutes per day is best of sustained sweaty exercise.  []  Take all of your medications every day as directed by your doctor. Call your doctor immediately if you have any questions about your medications or are running low.  []  Check your blood sugar often and when you feel unwell and keep a log to bring to all health appointments. FASTING: before you eat anything in the morning POSTPRANDIAL: 1-2 hours after a meal - randomly check after different meals  []  If any low blood sugars < 70, eat a snack and call your doctor immediately.  []  See an eye doctor every year and fax your diabetic eye exam to our office.  Fax: [phone]  []  Take good care of your feet and keep them soft and callus free. Check your feet daily and wear comfortable shoes. See your doctor immediately if you have any cuts, calluses or wounds on your feet.     Colin Benton R., DO

## 2015-11-18 ENCOUNTER — Telehealth: Payer: Self-pay | Admitting: *Deleted

## 2015-11-18 MED ORDER — LISINOPRIL 20 MG PO TABS: 20.0000 mg | ORAL_TABLET | Freq: Every day | ORAL | Status: DC

## 2015-11-18 NOTE — Telephone Encounter (Signed)
Rx has been sent to the pharmacy electronically. Lisinopril 20 mg daily # 30 11 refills

## 2015-11-18 NOTE — Telephone Encounter (Signed)
Patient called stating that Dr. Warren Lacy told her to take 20mg  of lisinopril daily instead of half a tablet daily (10mg ). In her chart it does state increase to 20mg  but her RX says take 0.5 tablet daily. Please advise.

## 2015-12-03 ENCOUNTER — Other Ambulatory Visit: Payer: Self-pay | Admitting: Hematology and Oncology

## 2015-12-03 NOTE — Telephone Encounter (Signed)
Chart reviewed.

## 2015-12-12 ENCOUNTER — Other Ambulatory Visit: Payer: Self-pay | Admitting: *Deleted

## 2015-12-12 MED ORDER — LISINOPRIL 20 MG PO TABS: 20.0000 mg | ORAL_TABLET | Freq: Every day | ORAL | 5 refills | Status: DC

## 2015-12-25 ENCOUNTER — Other Ambulatory Visit: Payer: Self-pay | Admitting: Family Medicine

## 2016-01-06 MED FILL — LANTUS SOLOSTAR 100 UNITS/M: 100 | 56 days supply | Qty: 6 | Fill #1 | Status: TO

## 2016-01-07 ENCOUNTER — Ambulatory Visit (INDEPENDENT_AMBULATORY_CARE_PROVIDER_SITE_OTHER): Payer: Commercial Managed Care - HMO | Admitting: Family Medicine

## 2016-01-07 ENCOUNTER — Encounter: Payer: Self-pay | Admitting: Family Medicine

## 2016-01-07 VITALS — BP 140/72 | HR 77 | Temp 97.9°F | Ht 61.0 in | Wt 190.6 lb

## 2016-01-07 DIAGNOSIS — M1990 Unspecified osteoarthritis, unspecified site: Secondary | ICD-10-CM | POA: Diagnosis not present

## 2016-01-07 DIAGNOSIS — E1140 Type 2 diabetes mellitus with diabetic neuropathy, unspecified: Secondary | ICD-10-CM | POA: Diagnosis not present

## 2016-01-07 DIAGNOSIS — G5603 Carpal tunnel syndrome, bilateral upper limbs: Secondary | ICD-10-CM | POA: Diagnosis not present

## 2016-01-07 DIAGNOSIS — S90822A Blister (nonthermal), left foot, initial encounter: Secondary | ICD-10-CM | POA: Diagnosis not present

## 2016-01-07 NOTE — Patient Instructions (Addendum)
keep follow up in October as scheduled  Apply antibiotic ointment around blister gently twice daily and avoid rubbing of shoe or shoe covering this area. Seek care immediately if any signs of infection or concerns or if not healing.  Increase insulin to 10 units nightly. Check with pharmacists or schedule nurse visit to ensure you are using your insulin properly. Check FASTING blood sugar daily for 3 days and call us with results so that we can help you adjust your insulin further if needed. Treat any low Blood sugar (<70) immediately and call.  Follow up with the orthopedic office about your pain concerns.

## 2016-01-07 NOTE — Progress Notes (Signed)
HPI:  Acute visit for multiple issues:  Blister L medial dorsal foot: -has been doing more walking, traveled last week -denies fevers, malaise, chills  Elevated BS: -checks in the morning, but eats at night, so not technically fasting always -has been in the 150s on average which is higher for her -taking 8 units lantus and metformin 1000mg  bid  CTS/Chronic back pain: -sees several specialist at [facility] -wonders if should do the surgery for CTS  ROS: See pertinent positives and negatives per HPI.  Past Medical History:  Diagnosis Date  . AK (actinic keratosis)   . Anxiety    occasional  . Breast cancer ([place])    R breast, s/p radiation 34Gy/53fx 04/29/10-05/05/10  . CTS (carpal tunnel syndrome)    Left  . Diabetes ([place])   . Diastolic CHF ([place])    Dx w/ hospitalization 06/2015 for respiratory failure  . Falls frequently    "can not pick left leg up"  . Foot fracture   . Hearing loss    bilateral hearing aides  . History of environmental allergies   . History of kidney stones 20 years ago  . Hypertension   . MDD (major depressive disorder) ([place])   . No blood products ([place])  09/11/2013  . Osteoarthritis    hx shoulder, knee, back, wrist pain; saw Dr. Wynelle Link   . RESTLESS LEGS SYNDROME 05/05/2007   Qualifier: Diagnosis of  By: Gwenette Greet MD, Armando Reichert   . RLS (restless legs syndrome)   . Sleep apnea     Past Surgical History:  Procedure Laterality Date  . APPENDECTOMY  age 20  . BREAST SURGERY  2012   rt breast lumpectomy  . flex signoidoscopy    . INCISION AND DRAINAGE PERIRECTAL ABSCESS    . JOINT REPLACEMENT Right 2006   knee  . TOTAL KNEE ARTHROPLASTY Left 08/06/2014   Procedure: LEFT TOTAL KNEE ARTHROPLASTY;  Surgeon: Gaynelle Arabian, MD;  Location: [facility];  Service: Orthopedics;  Laterality: Left;  . TUBAL LIGATION  1979    Family History  Problem Relation Age of Onset  . Cancer Sister     breast  . Heart disease Paternal Grandfather   .  Diabetes Other   . Obesity Other   . Heart disease Brother     Valve  . Alcohol abuse Father     Social History   Social History  . Marital status: Married    Spouse name: N/A  . Number of children: 3  . Years of education: N/A   Social History Main Topics  . Smoking status: Never Smoker  . Smokeless tobacco: Never Used  . Alcohol use No  . Drug use: No  . Sexual activity: Yes   Other Topics Concern  . None   Social History Narrative         Home Situation: lives with husband and granddaughter      Spiritual Beliefs: Jehovah Witness - no blood products                    Current Outpatient Prescriptions:  .  anastrozole (ARIMIDEX) 1 MG tablet, TAKE ONE TABLET BY MOUTH ONCE DAILY, Disp: 30 tablet, Rfl: 11 .  carvedilol (COREG) 3.125 MG tablet, Take 1 tablet (3.125 mg total) by mouth 2 (two) times daily with a meal., Disp: 60 tablet, Rfl: 5 .  cholecalciferol (VITAMIN D) 1000 units tablet, Take 1,000 Units by mouth 2 (two) times daily., Disp: , Rfl:  .  diphenhydramine-acetaminophen (TYLENOL PM) 25-500 MG TABS, Take 1 tablet by mouth at bedtime as needed (for pain /sleep)., Disp: , Rfl:  .  furosemide (LASIX) 40 MG tablet, Take 1 tablet (40 mg total) by mouth daily., Disp: 30 tablet, Rfl: 5 .  HYDROcodone-acetaminophen (NORCO) 7.5-325 MG per tablet, Take 1-2 tablets by mouth every 4 (four) hours as needed for moderate pain., Disp: 90 tablet, Rfl: 0 .  Insulin Glargine (LANTUS SOLOSTAR) 100 UNIT/ML Solostar Pen, Inject 8 Units into the skin daily at 10 pm., Disp: 5 pen, Rfl: PRN .  Insulin Pen Needle (BD PEN NEEDLE NANO U/F) 32G X 4 MM MISC, Use as directed once a day, Disp: 100 each, Rfl: 1 .  JANUVIA 100 MG tablet, TAKE ONE TABLET BY MOUTH ONCE DAILY, Disp: 90 tablet, Rfl: 1 .  lisinopril (PRINIVIL,ZESTRIL) 20 MG tablet, Take 1 tablet (20 mg total) by mouth daily., Disp: 30 tablet, Rfl: 5 .  metFORMIN (GLUCOPHAGE) 1000 MG tablet, TAKE ONE TABLET BY MOUTH TWICE DAILY,  Disp: 180 tablet, Rfl: 2 .  methocarbamol (ROBAXIN) 500 MG tablet, Take 500 mg by mouth every 6 (six) hours as needed for muscle spasms. Reported on 09/25/2015, Disp: , Rfl:  .  potassium chloride (K-DUR) 10 MEQ tablet, Take 1 tablet (10 mEq total) by mouth daily., Disp: 30 tablet, Rfl: 11 .  pramipexole (MIRAPEX) 1 MG tablet, TAKE ONE TABLET BY MOUTH TWICE DAILY, Disp: 180 tablet, Rfl: 1 .  Pyridoxine HCl (VITAMIN B-6 PO), Take by mouth., Disp: , Rfl:   EXAM:  Vitals:   01/07/16 1102  BP: 140/72  Pulse: 77  Temp: 97.9 F (36.6 C)    Body mass index is 36.01 kg/m.  GENERAL: vitals reviewed and listed above, alert, oriented, appears well hydrated and in no acute distress  HEENT: atraumatic, conjunttiva clear, no obvious abnormalities on inspection of external nose and ears  NECK: no obvious masses on inspection  LUNGS: clear to auscultation bilaterally, no wheezes, rales or rhonchi, good air movement  CV: HRRR, no peripheral edema  SKIN: blister R dorsal foot wear shoe rubs, no sign infection or any other lesions  MS: moves all extremities without noticeable abnormality  PSYCH: pleasant and cooperative, no obvious depression or anxiety  ASSESSMENT AND PLAN:  Discussed the following assessment and plan:  Blister of foot, left, initial encounter -shoe rub, no signs infection, opted not to deroof as likely would be more painful -advised care recs, and return precuations, podiatry referral if any infection develops or poor healing  Type 2 diabetes mellitus with diabetic neuropathy, without long-term current use of insulin (HCC) -increase to 10 units, call with fasting BS in 3 days  Osteoarthritis, unspecified osteoarthritis type, unspecified site Bilateral carpal tunnel syndrome -advised to follow up with her specialists -discussed various options for pain  -Patient advised to return or notify a doctor immediately if symptoms worsen or persist or new concerns  arise.  Patient Instructions   keep follow up in October as scheduled  Apply antibiotic ointment around blister gently twice daily and avoid rubbing of shoe or shoe covering this area. Seek care immediately if any signs of infection or concerns or if not healing.  Increase insulin to 10 units nightly. Check with pharmacists or schedule nurse visit to ensure you are using your insulin properly. Check FASTING blood sugar daily for 3 days and call us with results so that we can help you adjust your insulin further if needed. Treat any low Blood sugar (<70)  immediately and call.  Follow up with the orthopedic office about your pain concerns.    Colin Benton R., DO

## 2016-01-07 NOTE — Progress Notes (Signed)
Pre visit review using our clinic review tool, if applicable. No additional management support is needed unless otherwise documented below in the visit note. 

## 2016-01-22 ENCOUNTER — Institutional Professional Consult (permissible substitution): Payer: Commercial Managed Care - HMO | Admitting: Internal Medicine

## 2016-01-28 ENCOUNTER — Other Ambulatory Visit: Payer: Self-pay | Admitting: Family Medicine

## 2016-02-11 ENCOUNTER — Ambulatory Visit (INDEPENDENT_AMBULATORY_CARE_PROVIDER_SITE_OTHER)
Admission: RE | Admit: 2016-02-11 | Discharge: 2016-02-11 | Disposition: A | Discharge status: Home / Self Care | Payer: Commercial Managed Care - HMO | Source: Ambulatory Visit | Attending: Family Medicine | Admitting: Family Medicine

## 2016-02-11 ENCOUNTER — Encounter: Payer: Self-pay | Admitting: Family Medicine

## 2016-02-11 ENCOUNTER — Ambulatory Visit (INDEPENDENT_AMBULATORY_CARE_PROVIDER_SITE_OTHER): Payer: Commercial Managed Care - HMO | Admitting: Family Medicine

## 2016-02-11 VITALS — BP 132/80 | HR 74 | Temp 98.1°F | Ht 61.0 in | Wt 187.0 lb

## 2016-02-11 DIAGNOSIS — S0993XA Unspecified injury of face, initial encounter: Secondary | ICD-10-CM

## 2016-02-11 DIAGNOSIS — W19XXXA Unspecified fall, initial encounter: Secondary | ICD-10-CM

## 2016-02-11 DIAGNOSIS — Q670 Congenital facial asymmetry: Secondary | ICD-10-CM

## 2016-02-11 DIAGNOSIS — R220 Localized swelling, mass and lump, head: Secondary | ICD-10-CM | POA: Diagnosis not present

## 2016-02-11 NOTE — Progress Notes (Addendum)
HPI:  Acute visit for swollen eye/facial droop: -fell 9/1, mechanical fall and landed on L breast and face on concrete -no LOC or severe headache at the time; did have reported severe bruising, swelling and pain above L eye/cheek and within several hours sag of the L eyelid and forehead -she also had bruising of the L breast -denies: LOC, headache, weakness, speech changes, vision changes other then lid in line of vision, drooling, difficulty swallowing, any persistent pain anywhere -she was traveling so did not seek care until today  ROS: See pertinent positives and negatives per HPI.  Past Medical History:  Diagnosis Date  . AK (actinic keratosis)   . Anxiety    occasional  . Breast cancer ([place])    R breast, s/p radiation 34Gy/73fx 04/29/10-05/05/10  . CTS (carpal tunnel syndrome)    Left  . Diabetes ([place])   . Diastolic CHF ([place])    Dx w/ hospitalization 06/2015 for respiratory failure  . Falls frequently    "can not pick left leg up"  . Foot fracture   . Hearing loss    bilateral hearing aides  . History of environmental allergies   . History of kidney stones 20 years ago  . Hypertension   . MDD (major depressive disorder) ([place])   . No blood products ([place])  09/11/2013  . Osteoarthritis    hx shoulder, knee, back, wrist pain; saw Dr. Wynelle Link   . RESTLESS LEGS SYNDROME 05/05/2007   Qualifier: Diagnosis of  By: Gwenette Greet MD, Armando Reichert   . RLS (restless legs syndrome)   . Sleep apnea     Past Surgical History:  Procedure Laterality Date  . APPENDECTOMY  age 13  . BREAST SURGERY  2012   rt breast lumpectomy  . flex signoidoscopy    . INCISION AND DRAINAGE PERIRECTAL ABSCESS    . JOINT REPLACEMENT Right 2006   knee  . TOTAL KNEE ARTHROPLASTY Left 08/06/2014   Procedure: LEFT TOTAL KNEE ARTHROPLASTY;  Surgeon: Gaynelle Arabian, MD;  Location: [facility];  Service: Orthopedics;  Laterality: Left;  . TUBAL LIGATION  1979    Family History  Problem Relation Age of  Onset  . Cancer Sister     breast  . Heart disease Paternal Grandfather   . Diabetes Other   . Obesity Other   . Heart disease Brother     Valve  . Alcohol abuse Father     Social History   Social History  . Marital status: Married    Spouse name: N/A  . Number of children: 3  . Years of education: N/A   Social History Main Topics  . Smoking status: Never Smoker  . Smokeless tobacco: Never Used  . Alcohol use No  . Drug use: No  . Sexual activity: Yes   Other Topics Concern  . None   Social History Narrative         Home Situation: lives with husband and granddaughter      Spiritual Beliefs: Jehovah Witness - no blood products                    Current Outpatient Prescriptions:  .  anastrozole (ARIMIDEX) 1 MG tablet, TAKE ONE TABLET BY MOUTH ONCE DAILY, Disp: 30 tablet, Rfl: 11 .  carvedilol (COREG) 3.125 MG tablet, TAKE ONE TABLET BY MOUTH TWICE DAILY WITH A MEAL, Disp: 60 tablet, Rfl: 1 .  cholecalciferol (VITAMIN D) 1000 units tablet, Take 1,000 Units by mouth 2 (two)  times daily., Disp: , Rfl:  .  diphenhydramine-acetaminophen (TYLENOL PM) 25-500 MG TABS, Take 1 tablet by mouth at bedtime as needed (for pain /sleep)., Disp: , Rfl:  .  furosemide (LASIX) 40 MG tablet, TAKE ONE TABLET BY MOUTH ONCE DAILY, Disp: 30 tablet, Rfl: 1 .  HYDROcodone-acetaminophen (NORCO) 7.5-325 MG per tablet, Take 1-2 tablets by mouth every 4 (four) hours as needed for moderate pain., Disp: 90 tablet, Rfl: 0 .  Insulin Glargine (LANTUS SOLOSTAR) 100 UNIT/ML Solostar Pen, Inject 8 Units into the skin daily at 10 pm., Disp: 5 pen, Rfl: PRN .  Insulin Pen Needle (BD PEN NEEDLE NANO U/F) 32G X 4 MM MISC, Use as directed once a day, Disp: 100 each, Rfl: 1 .  JANUVIA 100 MG tablet, TAKE ONE TABLET BY MOUTH ONCE DAILY, Disp: 90 tablet, Rfl: 1 .  lisinopril (PRINIVIL,ZESTRIL) 20 MG tablet, Take 1 tablet (20 mg total) by mouth daily., Disp: 30 tablet, Rfl: 5 .  metFORMIN (GLUCOPHAGE) 1000  MG tablet, TAKE ONE TABLET BY MOUTH TWICE DAILY, Disp: 180 tablet, Rfl: 2 .  methocarbamol (ROBAXIN) 500 MG tablet, Take 500 mg by mouth every 6 (six) hours as needed for muscle spasms. Reported on 09/25/2015, Disp: , Rfl:  .  potassium chloride (K-DUR) 10 MEQ tablet, Take 1 tablet (10 mEq total) by mouth daily., Disp: 30 tablet, Rfl: 11 .  pramipexole (MIRAPEX) 1 MG tablet, TAKE ONE TABLET BY MOUTH TWICE DAILY, Disp: 180 tablet, Rfl: 1 .  Pyridoxine HCl (VITAMIN B-6 PO), Take by mouth., Disp: , Rfl:   EXAM:  Vitals:   02/11/16 0937  BP: 132/80  Pulse: 74  Temp: 98.1 F (36.7 C)    Body mass index is 35.33 kg/m.  GENERAL: vitals reviewed and listed above, alert, oriented, appears well hydrated and in no acute distress  HEENT: conjunttiva clear, PERRLA, EOMI, visual acuity grossly intact when line of vision cleared, loss of wrinkles on L forehead, pronounced L eyebrow ptosis, mild mouth droop L (but husband and pt report this is not new as had surgery on this area of the face remotely), healing bruise over left superior orbital rim with some remaining edema here and L upper cheek no obvious abnormalities on inspection of external nose and ears  NECK: no obvious masses on inspection  LUNGS: clear to auscultation bilaterally, no wheezes, rales or rhonchi, good air movement  CV: HRRR, no peripheral edema  MS: moves all extremities without noticeable abnormality  PSYCH: pleasant and cooperative, no obvious depression or anxiety  ASSESSMENT AND PLAN:  Discussed the following assessment and plan:  Facial asymmetry - Plan: CT Head Wo Contrast, CT MAXILLOFACIAL WO CONTRAST  Facial trauma, initial encounter - Plan: CT Head Wo Contrast, CT MAXILLOFACIAL WO CONTRAST  Fall, initial encounter  -we discussed possible serious and likely etiologies, workup and treatment, treatment risks and return precautions -after this discussion, Macrina opted for imaging to better assess, no driving.  Assistant contacted [facility] to help [place] in choosing the best studies to eval for local and internal trauma and Dr. Jorje Guild (neuroradiology) graciously provided recs after details of case provided for noncontrast CT head/maxillofacial. Orders placed. -follow up advised pedning -of course, we advised [place]  to return or notify a doctor immediately if symptoms worsen or persist or new concerns arise.    Patient Instructions  BEFORE YOU LEAVE: -appointment details for CT scans -follow up: pending results  No driving please.    Colin Benton R., DO

## 2016-02-11 NOTE — Progress Notes (Signed)
Pre visit review using our clinic review tool, if applicable. No additional management support is needed unless otherwise documented below in the visit note. 

## 2016-02-11 NOTE — Patient Instructions (Signed)
BEFORE YOU LEAVE: -appointment details for CT scans -follow up: pending results  No driving please.

## 2016-02-13 ENCOUNTER — Other Ambulatory Visit: Payer: Self-pay | Admitting: *Deleted

## 2016-02-13 DIAGNOSIS — H02402 Unspecified ptosis of left eyelid: Secondary | ICD-10-CM
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
## 2016-02-17 ENCOUNTER — Ambulatory Visit (INDEPENDENT_AMBULATORY_CARE_PROVIDER_SITE_OTHER): Payer: Commercial Managed Care - HMO | Admitting: Family Medicine

## 2016-02-17 ENCOUNTER — Encounter: Payer: Self-pay | Admitting: Family Medicine

## 2016-02-17 VITALS — BP 138/80 | HR 68 | Temp 97.8°F | Ht 61.0 in | Wt 187.9 lb

## 2016-02-17 DIAGNOSIS — F330 Major depressive disorder, recurrent, mild: Secondary | ICD-10-CM

## 2016-02-17 DIAGNOSIS — G2581 Restless legs syndrome: Secondary | ICD-10-CM

## 2016-02-17 DIAGNOSIS — E1140 Type 2 diabetes mellitus with diabetic neuropathy, unspecified: Secondary | ICD-10-CM | POA: Diagnosis not present

## 2016-02-17 DIAGNOSIS — M1990 Unspecified osteoarthritis, unspecified site: Secondary | ICD-10-CM

## 2016-02-17 DIAGNOSIS — I5032 Chronic diastolic (congestive) heart failure: Secondary | ICD-10-CM

## 2016-02-17 DIAGNOSIS — I10 Essential (primary) hypertension: Secondary | ICD-10-CM

## 2016-02-17 DIAGNOSIS — Z23 Encounter for immunization: Secondary | ICD-10-CM

## 2016-02-17 LAB — BASIC METABOLIC PANEL
BUN: 12 mg/dL (ref 6–23)
CO2: 34 meq/L — AB (ref 19–32)
Calcium: 8.9 mg/dL (ref 8.4–10.5)
Chloride: 95 mEq/L — ABNORMAL LOW (ref 96–112)
Creatinine, Ser: 0.53 mg/dL (ref 0.40–1.20)
GFR: 118.92 mL/min (ref >60.00)
GLUCOSE: 167 mg/dL — AB (ref 70–99)
Potassium: 3.1 mEq/L — ABNORMAL LOW (ref 3.5–5.1)
SODIUM: 138 meq/L (ref 135–145)

## 2016-02-17 LAB — HEMOGLOBIN A1C: Hgb A1c MFr Bld: 7.5 % — ABNORMAL HIGH (ref 4.6–6.5)

## 2016-02-17 NOTE — Progress Notes (Signed)
Pre visit review using our clinic review tool, if applicable. No additional management support is needed unless otherwise documented below in the visit note. 

## 2016-02-17 NOTE — Patient Instructions (Addendum)
BEFORE YOU LEAVE: -follow up: 3 months -flu shot -labs  We have ordered labs or studies at this visit. It can take up to 1-2 weeks for results and processing. IF results require follow up or explanation, we will call you with instructions. Clinically stable results will be released to your [facility]. If you have not heard from [place] or cannot find your results in [facility] in 2 weeks please contact our office at [phone].  If you are not yet signed up for [facility], please consider signing up.  We recommend the following healthy lifestyle for LIFE: 1) Small portions.   Tip: eat off of a salad plate instead of a dinner plate.  Tip: It is ok to feel hungry after a meal - that likely means you ate an appropriate portion.  Tip: if you need more or a snack choose fruits, veggies and/or a handful of nuts or seeds.  2) Eat a healthy clean diet.  * Tip: Avoid (less then 1 serving per week): processed foods, sweets, sweetened drinks, white starches (rice, flour, bread, potatoes, pasta, etc), red meat, fast foods, butter  *Tip: CHOOSE instead   * 5-9 servings per day of fresh or frozen fruits and vegetables (but not corn, potatoes, bananas, canned or dried fruit)   *nuts and seeds, beans   *olives and olive oil   *small portions of lean meats such as fish and white chicken    *small portions of whole grains  3)Get at least 150 minutes of sweaty aerobic exercise per week.  4)Reduce stress - consider counseling, meditation and relaxation to balance other aspects of your life.

## 2016-02-18 ENCOUNTER — Other Ambulatory Visit: Payer: Self-pay | Admitting: Family Medicine

## 2016-02-19 ENCOUNTER — Ambulatory Visit (INDEPENDENT_AMBULATORY_CARE_PROVIDER_SITE_OTHER): Payer: Commercial Managed Care - HMO | Admitting: Pulmonary Disease

## 2016-02-19 ENCOUNTER — Encounter: Payer: Self-pay | Admitting: Pulmonary Disease

## 2016-02-19 VITALS — BP 148/88 | HR 82 | Ht 61.0 in | Wt 192.0 lb

## 2016-02-19 DIAGNOSIS — E6609 Other obesity due to excess calories: Secondary | ICD-10-CM

## 2016-02-19 DIAGNOSIS — Z6836 Body mass index (BMI) 36.0-36.9, adult: Secondary | ICD-10-CM

## 2016-02-19 DIAGNOSIS — G4733 Obstructive sleep apnea (adult) (pediatric): Secondary | ICD-10-CM | POA: Diagnosis not present

## 2016-02-19 DIAGNOSIS — G894 Chronic pain syndrome: Secondary | ICD-10-CM | POA: Diagnosis not present

## 2016-02-19 NOTE — Progress Notes (Signed)
Subjective:    Patient ID: [name], female    DOB: March 16, 1941, 75 y.o.   MRN: [id number]  HPI   This is the case of [name], 75 y.o. Female, who was referred by Dr. Colin Benton  in consultation regarding OSA. Patient is a non smoker, not known to have asthma or copd.   Patient has had chronic generalized pain (back, hips, legs, knees), unsure cause, for several yrs.  Patient's pain happens at night, regardless whether she lays down. Lasts for at least 3-4 hrs, goes away by itself usually. Can last all night. Because of pain, patient is able to fall asleep at 3-4am, for a couple of hrs usually. She wakes up at 6am like clock work. She naps  2 hrs in the morning. She then  ends up sleeping 5-6 hrs in a 24 hr period. Whenever she sleeps, its fragmented and she only has 2-3 hrs of sleep at a time.   Patient denies snoring.  She has gasping, choking, witnessed apneas. Has unrefreshed sleep in am.  Occasional headaches.   Pt denies abnormal behavior in sleep.   ESS 19.   Patient had a sleep study on 02/09/2007. No official read was seen but her AHI was 18. She did not get a CPAP then because she had insurance issues which did not cover her CPAP.        Review of Systems  Constitutional: Negative.  Negative for fever and unexpected weight change.  HENT: Negative.  Negative for congestion, dental problem, ear pain, nosebleeds, postnasal drip, rhinorrhea, sinus pressure, sneezing, sore throat and trouble swallowing.   Eyes: Negative.  Negative for redness and itching.  Respiratory: Negative.  Negative for cough, chest tightness, shortness of breath and wheezing.   Cardiovascular: Positive for leg swelling. Negative for palpitations.  Gastrointestinal: Negative.  Negative for nausea and vomiting.  Endocrine: Negative.   Genitourinary: Negative.  Negative for dysuria.  Musculoskeletal: Positive for arthralgias, back pain and myalgias. Negative for joint swelling.  Skin: Negative.   Negative for rash.  Allergic/Immunologic: Negative.   Neurological: Negative.  Negative for headaches.  Hematological: Negative.  Does not bruise/bleed easily.  Psychiatric/Behavioral: Negative.  Negative for dysphoric mood. The patient is not nervous/anxious.    Past Medical History:  Diagnosis Date  . AK (actinic keratosis)   . Anxiety    occasional  . Breast cancer ([place])    R breast, s/p radiation 34Gy/47fx 04/29/10-05/05/10  . CTS (carpal tunnel syndrome)    Left  . Diabetes ([place])   . Diastolic CHF ([place])    Dx w/ hospitalization 06/2015 for respiratory failure  . Falls frequently    "can not pick left leg up"  . Foot fracture   . Hearing loss    bilateral hearing aides  . History of environmental allergies   . History of kidney stones 20 years ago  . Hypertension   . MDD (major depressive disorder)   . No blood products ([place])  09/11/2013  . Osteoarthritis    hx shoulder, knee, back, wrist pain; saw Dr. Wynelle Link   . RESTLESS LEGS SYNDROME 05/05/2007   Qualifier: Diagnosis of  By: Gwenette Greet MD, Armando Reichert   . RLS (restless legs syndrome)   . Sleep apnea    Had R breast CA in 2012. Had surgery, Rtx. (-) other CA. (-) DVT.   Family History  Problem Relation Age of Onset  . Cancer Sister     breast  . Heart  disease Paternal Grandfather   . Diabetes Other   . Obesity Other   . Heart disease Brother     Valve  . Alcohol abuse Father      Past Surgical History:  Procedure Laterality Date  . APPENDECTOMY  age 87  . BREAST SURGERY  2012   rt breast lumpectomy  . flex signoidoscopy    . INCISION AND DRAINAGE PERIRECTAL ABSCESS    . JOINT REPLACEMENT Right 2006   knee  . TOTAL KNEE ARTHROPLASTY Left 08/06/2014   Procedure: LEFT TOTAL KNEE ARTHROPLASTY;  Surgeon: Gaynelle Arabian, MD;  Location: [facility];  Service: Orthopedics;  Laterality: Left;  . TUBAL LIGATION  1979    Social History   Social History  . Marital status: Married    Spouse name: N/A  . Number  of children: 3  . Years of education: N/A   Occupational History  . Not on file.   Social History Main Topics  . Smoking status: Never Smoker  . Smokeless tobacco: Never Used  . Alcohol use No  . Drug use: No  . Sexual activity: Yes   Other Topics Concern  . Not on file   Social History Narrative         Home Situation: lives with husband and granddaughter      Spiritual Beliefs: [employer] Witness - no blood products                  Lives in [place]. Was a housewife.   Allergies  Allergen Reactions  . Sulfonamide Derivatives Swelling    Lips Swelling  . Other     BLOOD PRODUCT REFUSAL     Outpatient Medications Prior to Visit  Medication Sig Dispense Refill  . anastrozole (ARIMIDEX) 1 MG tablet TAKE ONE TABLET BY MOUTH ONCE DAILY 30 tablet 11  . carvedilol (COREG) 3.125 MG tablet TAKE ONE TABLET BY MOUTH TWICE DAILY WITH A MEAL 60 tablet 1  . cholecalciferol (VITAMIN D) 1000 units tablet Take 1,000 Units by mouth 2 (two) times daily.    . diphenhydramine-acetaminophen (TYLENOL PM) 25-500 MG TABS Take 1 tablet by mouth at bedtime as needed (for pain /sleep).    . furosemide (LASIX) 40 MG tablet TAKE ONE TABLET BY MOUTH ONCE DAILY 30 tablet 1  . HYDROcodone-acetaminophen (NORCO) 7.5-325 MG per tablet Take 1-2 tablets by mouth every 4 (four) hours as needed for moderate pain. 90 tablet 0  . Insulin Glargine (LANTUS SOLOSTAR) 100 UNIT/ML Solostar Pen Inject 8 Units into the skin daily at 10 pm. (Patient taking differently: Inject 12 Units into the skin daily at 10 pm. ) 5 pen PRN  . Insulin Pen Needle (BD PEN NEEDLE NANO U/F) 32G X 4 MM MISC Use as directed once a day 100 each 1  . JANUVIA 100 MG tablet TAKE ONE TABLET BY MOUTH ONCE DAILY 90 tablet 1  . lisinopril (PRINIVIL,ZESTRIL) 20 MG tablet Take 1 tablet (20 mg total) by mouth daily. 30 tablet 5  . metFORMIN (GLUCOPHAGE) 1000 MG tablet TAKE ONE TABLET BY MOUTH TWICE DAILY 180 tablet 2  . methocarbamol (ROBAXIN) 500 MG  tablet Take 500 mg by mouth every 6 (six) hours as needed for muscle spasms. Reported on 09/25/2015    . potassium chloride (K-DUR) 10 MEQ tablet Take 1 tablet (10 mEq total) by mouth daily. 30 tablet 11  . pramipexole (MIRAPEX) 1 MG tablet TAKE ONE TABLET BY MOUTH TWICE DAILY 180 tablet 1  . Pyridoxine HCl (VITAMIN  B-6 PO) Take by mouth.     No facility-administered medications prior to visit.    No orders of the defined types were placed in this encounter.       Objective:   Physical Exam  Vitals:  Vitals:   02/19/16 1422  BP: (!) 148/88  Pulse: 82  SpO2: 95%  Weight: 192 lb (87.1 kg)  Height: 5\' 1"  (1.549 m)    Constitutional/General:  Pleasant, well-nourished, well-developed, not in any distress,  Comfortably seating.  Well kempt  Body mass index is 36.28 kg/m. Wt Readings from Last 3 Encounters:  02/19/16 192 lb (87.1 kg)  02/17/16 187 lb 14.4 oz (85.2 kg)  02/11/16 187 lb (84.8 kg)      HEENT: Pupils equal and reactive to light and accommodation. Anicteric sclerae. Normal nasal mucosa.   No oral  lesions,  mouth clear,  oropharynx clear, no postnasal drip. (-) Oral thrush. No dental caries.  Airway - Mallampati class III  Neck: No masses. Midline trachea. No JVD, (-) LAD. (-) bruits appreciated.  Respiratory/Chest: Grossly normal chest. (-) deformity. (-) Accessory muscle use.  Symmetric expansion. (-) Tenderness on palpation.  Resonant on percussion.  Diminished BS on both lower lung zones. (-) wheezing, crackles, rhonchi (-) egophony  Cardiovascular: Regular rate and  rhythm, heart sounds normal, no murmur or gallops, no peripheral edema  Gastrointestinal:  Normal bowel sounds. Soft, non-tender. No hepatosplenomegaly.  (-) masses.   Musculoskeletal:  Normal muscle tone. Normal gait.   Extremities: Grossly normal. (-) clubbing, cyanosis.  (-) edema  Skin: (-) rash,lesions seen.   Neurological/Psychiatric : alert, oriented to time, place,  person. Normal mood and affect          Assessment & Plan:  Obstructive sleep apnea Patient has had chronic generalized pain (back, hips, legs, knees), unsure cause, for several yrs.  Patient's pain happens at night, regardless whether she lays down. Lasts for at least 3-4 hrs, goes away by itself usually. Can last all night. Because of pain, patient is able to fall asleep at 3-4am, for a couple of hrs usually. She wakes up at 6am like clock work. She naps  2 hrs in the morning. She then  ends up sleeping 5-6 hrs in a 24 hr period. Whenever she sleeps, its fragmented and she only has 2-3 hrs of sleep at a time.   Patient denies snoring.  She has gasping, choking, witnessed apneas. Has unrefreshed sleep in am.  Occasional headaches.   Pt denies abnormal behavior in sleep.   ESS 19.   Patient had a sleep study on 02/09/2007. No official read was seen but her AHI was 18. She did not get a CPAP then because she had insurance issues which did not cover her CPAP.  Plan :  We discussed about the diagnosis of Obstructive Sleep Apnea (OSA) and implications of untreated OSA. We discussed about CPAP and BiPaP as possible treatment options.    We will schedule the patient for a sleep study. Plan for a split night study.  Pain might be an issue but we will try to do a lab sleep study.  She was able to do it before.  I am hopeful that cpap will help her with the pain and will make her have a consolidated sleep and better sleep quality.  If on cpap, we can try her on neurontin for the pain that she has.  She may even have "RLS" for which we can try dopaminergic agents.  Patient was instructed to call the office if he/she has not heard back from the office 1-2 weeks after the sleep study.   Patient was instructed to call the office if he/she is having issues with the PAP device.   We discussed good sleep hygiene.   Patient was advised not to engage in activities requiring concentration and/or  vigilance if he/she is sleepy.  Patient was advised not to drive if he/she is sleepy.    Chronic pain syndrome Pt with chronic pain of back, hips, knees, legs, for yrs, unsure trigger, happnes at night regardless whether she is laying down, sitting, or standing.  Not sure if this is a variant of RLS. Plan to Rx osa. If not better on cpap, may try horizant or requip.   Obesity Weight reduction    Thank you very much for letting me participate in this patient's care. Please do not hesitate to give me a call if you have any questions or concerns regarding the treatment plan.   Patient will follow up with me in 2 months.     Monica Becton, MD 02/20/2016   12:01 AM [facility] [place] Pager: [phone] Office: [phone], Fax: [phone]

## 2016-02-19 NOTE — Patient Instructions (Signed)
It was a pleasure taking care of you today!  We will schedule you to have a sleep study to determine if you have sleep apnea.   We will get a home sleep test.  You will be instructed to come back to the office to get an apparatus to sleep with overnight.  Once we have the apparatus, it will usually take [place] 1-2 weeks to read the study and get back at you with results of the test.  Please give [place] a call in 2 weeks after your study if you do not hear back from [place].   We will get a lab sleep study.  You will be scheduled to have a lab sleep study in 4-6 weeks.  Someone from the sleep lab will call you in 2-3 days to schedule the study with you.  They usually have cancellations every night so most likely, they will have openings for a lab sleep study next week or so.  We encourage you to do your sleep study then if possible. Please give [place] a call in a week is no one from the sleep lab calls you in 2-3 days.     Please make sure you use your CPAP device everytime you sleep.  We will monitor the usage of your machine per your insurance requirement.  Your insurance company may take the machine from you if you are not using it regularly.   Please clean the mask, tubings, filter, water reservoir with soapy water every week.  Please use distilled water for the water reservoir.   Please call the office or your machine provider (DME company) if you are having issues with the device.   Return to clinic in 8 weeks with Dr. Corrie Dandy

## 2016-02-19 NOTE — Assessment & Plan Note (Signed)
Patient has had chronic generalized pain (back, hips, legs, knees), unsure cause, for several yrs.  Patient's pain happens at night, regardless whether she lays down. Lasts for at least 3-4 hrs, goes away by itself usually. Can last all night. Because of pain, patient is able to fall asleep at 3-4am, for a couple of hrs usually. She wakes up at 6am like clock work. She naps  2 hrs in the morning. She then  ends up sleeping 5-6 hrs in a 24 hr period. Whenever she sleeps, its fragmented and she only has 2-3 hrs of sleep at a time.   Patient denies snoring.  She has gasping, choking, witnessed apneas. Has unrefreshed sleep in am.  Occasional headaches.   Pt denies abnormal behavior in sleep.   ESS 19.   Patient had a sleep study on 02/09/2007. No official read was seen but her AHI was 18. She did not get a CPAP then because she had insurance issues which did not cover her CPAP.  Plan :  We discussed about the diagnosis of Obstructive Sleep Apnea (OSA) and implications of untreated OSA. We discussed about CPAP and BiPaP as possible treatment options.    We will schedule the patient for a sleep study. Plan for a split night study.  Pain might be an issue but we will try to do a lab sleep study.  She was able to do it before.  I am hopeful that cpap will help her with the pain and will make her have a consolidated sleep and better sleep quality.  If on cpap, we can try her on neurontin for the pain that she has.  She may even have "RLS" for which we can try dopaminergic agents.    Patient was instructed to call the office if he/she has not heard back from the office 1-2 weeks after the sleep study.   Patient was instructed to call the office if he/she is having issues with the PAP device.   We discussed good sleep hygiene.   Patient was advised not to engage in activities requiring concentration and/or vigilance if he/she is sleepy.  Patient was advised not to drive if he/she is sleepy.

## 2016-02-20 DIAGNOSIS — E669 Obesity, unspecified: Secondary | ICD-10-CM | POA: Insufficient documentation

## 2016-02-20 NOTE — Assessment & Plan Note (Signed)
Pt with chronic pain of back, hips, knees, legs, for yrs, unsure trigger, happnes at night regardless whether she is laying down, sitting, or standing.  Not sure if this is a variant of RLS. Plan to Rx osa. If not better on cpap, may try horizant or requip.

## 2016-02-20 NOTE — Assessment & Plan Note (Signed)
Weight reduction 

## 2016-02-21 MED FILL — UNIFINE PENTIPS 32GX5/32": 32G X 4 MM | 90 days supply | Qty: 100 | Fill #1

## 2016-02-21 MED FILL — UNIFINE PENTIPS 32GX5/32: 32G X 4 MM | 90 days supply | Qty: 100 | Fill #1

## 2016-02-24 MED FILL — LANTUS SOLOSTAR 100 UNITS/M: 100 | 56 days supply | Qty: 6 | Fill #2 | Status: TO

## 2016-03-07 DIAGNOSIS — D485 Neoplasm of uncertain behavior of skin: Secondary | ICD-10-CM | POA: Diagnosis not present

## 2016-03-11 DIAGNOSIS — L821 Other seborrheic keratosis: Secondary | ICD-10-CM | POA: Diagnosis not present

## 2016-03-13 ENCOUNTER — Ambulatory Visit (HOSPITAL_BASED_OUTPATIENT_CLINIC_OR_DEPARTMENT_OTHER): Payer: Commercial Managed Care - HMO | Attending: Pulmonary Disease | Admitting: Pulmonary Disease

## 2016-03-13 VITALS — Ht 61.0 in | Wt 192.0 lb

## 2016-03-13 DIAGNOSIS — G4733 Obstructive sleep apnea (adult) (pediatric): Secondary | ICD-10-CM | POA: Diagnosis not present

## 2016-03-13 DIAGNOSIS — G4736 Sleep related hypoventilation in conditions classified elsewhere: Secondary | ICD-10-CM | POA: Diagnosis not present

## 2016-03-20 DIAGNOSIS — L821 Other seborrheic keratosis: Secondary | ICD-10-CM | POA: Diagnosis not present

## 2016-03-23 ENCOUNTER — Other Ambulatory Visit: Payer: Self-pay | Admitting: Family Medicine

## 2016-03-24 ENCOUNTER — Telehealth: Payer: Self-pay | Admitting: Pulmonary Disease

## 2016-03-24 DIAGNOSIS — G4733 Obstructive sleep apnea (adult) (pediatric): Secondary | ICD-10-CM

## 2016-03-24 NOTE — Telephone Encounter (Signed)
    Please call the pt and tell the pt the [place]  showed OSA  She found it difficult to sleep and only had 34 minutes of sleep time! Pls ask pt if she wants to try cpap? If not, ask her if she wants to discuss the results of the sleep study and cpap therapy on follow up.   If she wants to try cpap >> please order autoCPAP 5-15 cm H2O. Patient will need a mask fitting session. Patient will need a 1 month download.   Patient needs to be seen by me or any of the NPs/APPs  4-6 weeks after obtaining the cpap machine. Otherwise, f/u as scheduled.   Let me know if you receive this.   Thanks!   J. Shirl Harris, MD 03/24/2016, 6:21 AM

## 2016-03-24 NOTE — Procedures (Signed)
NAME: [name] DATE OF BIRTH:  1940-09-17 MEDICAL RECORD NUMBER [id number]  LOCATION: [facility]  PHYSICIAN: [place] OF STUDY: 03/13/2016   CLINICAL INFORMATION  Sleep Study Type: NPSG  Indication for sleep study: Excessive Daytime Sleepiness, Fatigue, Obesity, OSA, Snoring  Epworth Sleepiness Score: 22   SLEEP STUDY TECHNIQUE  As per the AASM Manual for the Scoring of Sleep and Associated Events v2.3 (April 2016) with a hypopnea requiring 4% desaturations.  The channels recorded and monitored were frontal, central and occipital EEG, electrooculogram (EOG), submentalis EMG (chin), nasal and oral airflow, thoracic and abdominal wall motion, anterior tibialis EMG, snore microphone, electrocardiogram, and pulse oximetry.   MEDICATIONS  Medications self-administered by patient taken the night of the study : N/A   SLEEP ARCHITECTURE  The study was initiated at 10:35:57 PM and ended at 4:38:43 AM.  Sleep onset time was 96.5 minutes and the sleep efficiency was 9.4%. The total sleep time was 34.0 minutes.  Stage REM latency was N/A minutes.  The patient spent 13.24% of the night in stage N1 sleep, 86.76% in stage N2 sleep, 0.00% in stage N3 and 0.00% in REM.  Alpha intrusion was absent.  Supine sleep was 19.12%.   RESPIRATORY PARAMETERS  This was a suboptimal study as patient found it difficult fall and stay asleep. She only had 34 minutes of sleep time. The overall apnea/hypopnea index (AHI) was 61.8 per hour. There were 0 total apneas, including 0 obstructive, 0 central and 0 mixed apneas. There were 35 hypopneas and 0 RERAs. The AHI during Stage REM sleep was N/A per hour. AHI while supine was 46.2 per hour. The mean oxygen saturation was 90.43%. The minimum SpO2 during sleep was 80.00%. snoring was noted during this study.  CARDIAC DATA  The 2 lead EKG demonstrated sinus rhythm. The mean heart rate was 66.51 beats per minute. Other  EKG findings include: PVCs.   LEG MOVEMENT DATA  The total PLMS were 0 with a resulting PLMS index of 0.00. Associated arousal with leg movement index was 0.0 .  IMPRESSIONS  1. Suboptimal study as patient only slept for 34 minutes. 2. Severe obstructive sleep apnea occurred during this study (AHI = 61.8/h). 3. No significant central sleep apnea occurred during this study (CAI = 0.0/h). 4. Moderate oxygen desaturation was noted during this study (Min O2 = 80.00%). 5. No snoring was audible during this study. 6. EKG findings include PVCs. 7. Clinically significant periodic limb movements did not occur during sleep. No significant associated arousals.  DIAGNOSIS  1. Obstructive Sleep Apnea (327.23 [G47.33 ICD-10]) 2. 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 This was a suboptimal study as patient found it difficult to fall and stay asleep. She only had 34 minutes of sleep. Her AHI was 62 during her 34 minutes of sleep. 2. Patient needs to discuss in the office the outcome of this study as well as possible treatment with CPAP. Patient stated she will not tolerate CPAP therapy. 3. Avoid alcohol, sedatives and other CNS depressants that may worsen sleep apnea and disrupt normal sleep architecture. 4. Sleep hygiene should be reviewed to assess factors that may improve sleep quality. 5. Weight management and regular exercise should be initiated or continued if appropriate. 6. Return to the office as scheduled.  Monica Becton, MD 03/24/2016, 6:19 AM [facility] Pager [phone] After 3 pm or if no  answer, call [phone]

## 2016-03-25 NOTE — Telephone Encounter (Signed)
LM for pt to returncall

## 2016-03-30 NOTE — Telephone Encounter (Signed)
Spoke with pt. She is aware of results. Order has been placed for CPAP. Pt will call back make follow up appointment, stated she was "foggy."

## 2016-03-30 NOTE — Telephone Encounter (Signed)
[phone] pt calling back

## 2016-03-30 NOTE — Telephone Encounter (Signed)
Unable to reach Pt. LMOM TCB

## 2016-04-02 ENCOUNTER — Telehealth: Payer: Self-pay | Admitting: Family Medicine

## 2016-04-02 ENCOUNTER — Encounter: Payer: Self-pay | Admitting: Adult Health

## 2016-04-02 ENCOUNTER — Ambulatory Visit (INDEPENDENT_AMBULATORY_CARE_PROVIDER_SITE_OTHER): Payer: Commercial Managed Care - HMO | Admitting: Adult Health

## 2016-04-02 VITALS — BP 158/60 | HR 77 | Temp 98.3°F | Ht 61.0 in | Wt 195.8 lb

## 2016-04-02 DIAGNOSIS — M1990 Unspecified osteoarthritis, unspecified site: Secondary | ICD-10-CM | POA: Diagnosis not present

## 2016-04-02 DIAGNOSIS — G2581 Restless legs syndrome: Secondary | ICD-10-CM

## 2016-04-02 MED ORDER — HYDROCODONE-ACETAMINOPHEN 7.5-325 MG PO TABS: 1.0000 | ORAL_TABLET | Freq: Four times a day (QID) | ORAL | 0 refills | Status: DC | PRN

## 2016-04-02 NOTE — Progress Notes (Signed)
Subjective:    Patient ID: [name], female    DOB: 21-Nov-1940, 75 y.o.   MRN: [id number]  HPI  75 year old female who  has a past medical history of AK (actinic keratosis); Anxiety; Breast cancer ([place]); CTS (carpal tunnel syndrome); Diabetes ([place]); Diastolic CHF ([place]); Falls frequently; Foot fracture; Hearing loss; History of environmental allergies; History of kidney stones (20 years ago); Hypertension; MDD (major depressive disorder); No blood products ([place])  (09/11/2013); Osteoarthritis; RESTLESS LEGS SYNDROME (05/05/2007); RLS (restless legs syndrome); and Sleep apnea. She is a patient of Dr. Maudie Mercury who presents to the office today for worsening restless leg syndromes which has led to not being able to sleep. She reports that she has not slept in the last 2 days due to restless leg and chronic back pain. . She had fallen 4 times, due to " falling asleep while standing up." She reports that she is unable to lie down in bed due to pain and she cannot in a chair due to pain.   Per patients husband who helps provide history, she often goes through a day or two of this from time to time but then her symptoms resolve enough that she is able to sleep.    She denies any injuries from her falls.   Review of Systems  Constitutional: Positive for activity change.  Respiratory: Negative.   Cardiovascular: Negative.   Gastrointestinal: Negative.   Musculoskeletal: Positive for arthralgias, back pain and gait problem. Negative for joint swelling, neck pain and neck stiffness.  Skin: Negative.   Psychiatric/Behavioral: Positive for decreased concentration and sleep disturbance. Negative for confusion.  All other systems reviewed and are negative.      Objective:   Physical Exam  Constitutional: She is oriented to person, place, and time. She appears well-developed and well-nourished.  Eyes: Conjunctivae and EOM are normal. Pupils are equal, round, and reactive to light. Right eye  exhibits no discharge. Left eye exhibits no discharge.  Cardiovascular: Normal rate, regular rhythm, normal heart sounds and intact distal pulses.  Exam reveals no gallop and no friction rub.   Pulmonary/Chest: Effort normal and breath sounds normal. No respiratory distress. She has no wheezes. She has no rales. She exhibits no tenderness.  Musculoskeletal: She exhibits tenderness. She exhibits no edema or deformity.  Walks with a slow steady gait  Neurological: She is alert and oriented to person, place, and time.  Skin: Skin is warm and dry. No rash noted. She is not diaphoretic. No erythema. No pallor.  Psychiatric: She has a normal mood and affect. Her behavior is normal. Judgment and thought content normal.  She often would fall asleep during conversation   Nursing note and vitals reviewed.      Assessment & Plan:   1. Osteoarthritis, unspecified osteoarthritis type, unspecified site - HYDROcodone-acetaminophen (NORCO) 7.5-325 MG tablet; Take 1 tablet by mouth every 6 (six) hours as needed for moderate pain.  Dispense: 10 tablet; Refill: 0  2. RESTLESS LEGS SYNDROME - HYDROcodone-acetaminophen (NORCO) 7.5-325 MG tablet; Take 1 tablet by mouth every 6 (six) hours as needed for moderate pain.  Dispense: 10 tablet; Refill: 0  There was some confusion on my part what she was looking for today. Her and her husband were unable to explain this to me. From my exam it appears as though she wanted relief for pain and this would help her sleep  I advised to follow up with Dr. Maudie Mercury next week as well as her othopedic  doctor. She is to use her cane at all times.   Dorothyann Peng, NP

## 2016-04-02 NOTE — Patient Instructions (Signed)
It was great meeting you today.   I have written for pain medication you can take every 6-8 hours as needed for pain. I am hoping this will help you sleep as well.   I would like you to follow up with Dr. Maudie Mercury and orthopedics

## 2016-04-02 NOTE — Telephone Encounter (Signed)
Patient Name: JOLANA HASSER  DOB: [birth year]    Initial Comment Caller states she is having trouble sleeping, trouble laying down, and trouble standing up. Fell 4 times yesterday.   Nurse Assessment  Nurse: Raphael Gibney, RN, Vanita Ingles Date/Time (Eastern Time): 04/02/2016 9:26:37 AM  Confirm and document reason for call. If symptomatic, describe symptoms. You must click the next button to save text entered. ---Caller states she is having trouble sleeping. She fell 4 times during the night. Can not lay down due to pain. Having pain in her legs and hips. She is so tired that it makes her lose her balance and fall. pain level varies. She has not slept the past 2 nights.  Has the patient traveled out of the country within the last 30 days? ---Not Applicable  Does the patient have any new or worsening symptoms? ---Yes  Will a triage be completed? ---Yes  Related visit to physician within the last 2 weeks? ---No  Does the PT have any chronic conditions? (i.e. diabetes, asthma, etc.) ---Yes  List chronic conditions. ---diabetes; sleep apnea; heart problems  Is this a behavioral health or substance abuse call? ---No     Guidelines    Guideline Title Affirmed Question Affirmed Notes  Hip Pain [1] SEVERE pain (e.g., excruciating, unable to do any normal activities) AND [2] not improved after 2 hours of pain medicine    Final Disposition User   See Physician within 4 Hours (or PCP triage) Raphael Gibney, RN, [place]    Comments  appt scheduled for 04/02/2016 at 11 am at [facility] with [facility]   Referrals  REFERRED TO PCP OFFICE   Disagree/Comply: Leta Baptist

## 2016-04-02 NOTE — Telephone Encounter (Signed)
FYI

## 2016-04-02 NOTE — Telephone Encounter (Signed)
Noted  

## 2016-04-05 NOTE — Progress Notes (Signed)
HPI:  Follow up chronic pain. Complicated PMH and intermittently poor compliance. Appears saw my colleague last week for flare of her chronic pain she reported to me in the past was from OA (sees several specialists at [facility] for management and is s/p bilateral knee replacement and back injections) and RLS. My colleague gave her a few narcotic pain meds and she was told to follow up with me. She is chronically tired and has sleep apnea that she chose not to treat in the past, due to insurance issues. She saw the pulmonologist recently for retesting. CPAP was ordered. She has very poor sleep.  Today she reports, she only took 2 of the Norco, but that was enough to help with the pain with her flare. She reports she has bad pain all the time, but occasional flares that are severe enough to keep her from sleeping. Her primary pain at this point is in her back. She sees Dr. Prudy Feeler for this, but has not seen him recently. She and her husband feel like her sleep issues are not obstructive sleep apnea, but are only due to her pain. She did not know she was supposed to start using CPAP. She reports nobody called her from the pulmonology office. They also report nobody called him about fixing the drooping left eyebrow. She has mild depression, chronic and unchanged. She has not tried antidepressants for this in the past. Today we discussed various options for this and chronic pain and she is agreeable to trying Cymbalta. Her blood pressure is elevated today. She reports she didn't take any of her medicines. She is upset because she reports that we I sent only 30 days of her medications that she would like 90 days. I let her know that our office usually spends 90 day refills, but I will have my assistant check on this.  ROS: See pertinent positives and negatives per HPI.  Past Medical History:  Diagnosis Date  . AK (actinic keratosis)   . Anxiety    occasional  . Breast cancer ([place])    R breast,  s/p radiation 34Gy/40fx 04/29/10-05/05/10  . CTS (carpal tunnel syndrome)    Left  . Diabetes ([place])   . Diastolic CHF ([place])    Dx w/ hospitalization 06/2015 for respiratory failure  . Falls frequently    "can not pick left leg up"  . Foot fracture   . Hearing loss    bilateral hearing aides  . History of environmental allergies   . History of kidney stones 20 years ago  . Hypertension   . MDD (major depressive disorder)   . No blood products ([place])  09/11/2013  . Osteoarthritis    hx shoulder, knee, back, wrist pain; saw Dr. Wynelle Link   . RESTLESS LEGS SYNDROME 05/05/2007   Qualifier: Diagnosis of  By: Gwenette Greet MD, Armando Reichert   . RLS (restless legs syndrome)   . Sleep apnea     Past Surgical History:  Procedure Laterality Date  . APPENDECTOMY  age 65  . BREAST SURGERY  2012   rt breast lumpectomy  . flex signoidoscopy    . INCISION AND DRAINAGE PERIRECTAL ABSCESS    . JOINT REPLACEMENT Right 2006   knee  . TOTAL KNEE ARTHROPLASTY Left 08/06/2014   Procedure: LEFT TOTAL KNEE ARTHROPLASTY;  Surgeon: Gaynelle Arabian, MD;  Location: [facility];  Service: Orthopedics;  Laterality: Left;  . TUBAL LIGATION  1979    Family History  Problem Relation Age of Onset  .  Cancer Sister     breast  . Heart disease Paternal Grandfather   . Diabetes Other   . Obesity Other   . Heart disease Brother     Valve  . Alcohol abuse Father     Social History   Social History  . Marital status: Married    Spouse name: N/A  . Number of children: 3  . Years of education: N/A   Social History Main Topics  . Smoking status: Never Smoker  . Smokeless tobacco: Never Used  . Alcohol use No  . Drug use: No  . Sexual activity: Yes   Other Topics Concern  . None   Social History Narrative         Home Situation: lives with husband and granddaughter      Spiritual Beliefs: Jehovah Witness - no blood products                    Current Outpatient Prescriptions:  .  anastrozole  (ARIMIDEX) 1 MG tablet, TAKE ONE TABLET BY MOUTH ONCE DAILY, Disp: 30 tablet, Rfl: 11 .  carvedilol (COREG) 3.125 MG tablet, Take 1 tablet (3.125 mg total) by mouth 2 (two) times daily with a meal., Disp: 180 tablet, Rfl: 1 .  cholecalciferol (VITAMIN D) 1000 units tablet, Take 1,000 Units by mouth 2 (two) times daily., Disp: , Rfl:  .  diphenhydramine-acetaminophen (TYLENOL PM) 25-500 MG TABS, Take 1 tablet by mouth at bedtime as needed (for pain /sleep)., Disp: , Rfl:  .  furosemide (LASIX) 40 MG tablet, Take 1 tablet (40 mg total) by mouth daily., Disp: 90 tablet, Rfl: 1 .  HYDROcodone-acetaminophen (NORCO) 7.5-325 MG tablet, Take 1 tablet by mouth every 6 (six) hours as needed for moderate pain., Disp: 10 tablet, Rfl: 0 .  Insulin Glargine (LANTUS SOLOSTAR) 100 UNIT/ML Solostar Pen, Inject 12 Units into the skin daily at 10 pm., Disp: 5 pen, Rfl: 1 .  Insulin Pen Needle (BD PEN NEEDLE NANO U/F) 32G X 4 MM MISC, Use as directed once a day, Disp: 100 each, Rfl: 1 .  lisinopril (PRINIVIL,ZESTRIL) 20 MG tablet, Take 1 tablet (20 mg total) by mouth daily., Disp: 90 tablet, Rfl: 1 .  metFORMIN (GLUCOPHAGE) 1000 MG tablet, Take 1 tablet (1,000 mg total) by mouth 2 (two) times daily., Disp: 180 tablet, Rfl: 2 .  methocarbamol (ROBAXIN) 500 MG tablet, Take 500 mg by mouth every 6 (six) hours as needed for muscle spasms. Reported on 09/25/2015, Disp: , Rfl:  .  potassium chloride (K-DUR) 10 MEQ tablet, Take 1 tablet (10 mEq total) by mouth daily., Disp: 30 tablet, Rfl: 11 .  pramipexole (MIRAPEX) 1 MG tablet, TAKE ONE TABLET BY MOUTH TWICE DAILY, Disp: 180 tablet, Rfl: 1 .  Pyridoxine HCl (VITAMIN B-6 PO), Take by mouth., Disp: , Rfl:  .  sitaGLIPtin (JANUVIA) 100 MG tablet, Take 1 tablet (100 mg total) by mouth daily., Disp: 90 tablet, Rfl: 1 .  DULoxetine (CYMBALTA) 30 MG capsule, Take 1 capsule (30 mg total) by mouth daily., Disp: 90 capsule, Rfl: 1  EXAM:  Vitals:   04/06/16 1111  BP: 136/90   Pulse: 75  Temp: 97.9 F (36.6 C)    Body mass index is 36.37 kg/m.  GENERAL: vitals reviewed and listed above, alert, oriented, appears well hydrated and in no acute distress  HEENT: atraumatic,droop of left eyebrow, conjunttiva clear, no obvious abnormalities on inspection of external nose and ears  NECK: no obvious masses on  inspection  LUNGS: clear to auscultation bilaterally, no wheezes, rales or rhonchi, good air movement  CV: HRRR, no peripheral edema  MS: moves all extremities without noticeable abnormality  PSYCH: pleasant and cooperative, no obvious depression or anxiety  ASSESSMENT AND PLAN:  Discussed the following assessment and plan:  Chronic pain syndrome  Mild episode of recurrent major depressive disorder (HCC)  Obstructive sleep apnea  Osteoarthritis, unspecified osteoarthritis type, unspecified site  RESTLESS LEGS SYNDROME  Essential hypertension  -discussed various options for the management of chronic pain -advised against the regular use of opioids or NSAIDs in her situation, though could potentially try topical NSAID -She opted to try Cymbalta for her depression and chronic pain after discussion of risks and benefits, print a prescription provided -Advised that she follow-up with Dr. Nelva Bush about her back pain as this seems to be her primary concern, may need some more physical therapy -diabetic control and treatment of her sleep apnea will be important and advised she call the pulmonologist about getting on the CPAP, this could even potentially help her pain and will likely improve her sleep -if can't get her pain to a tolerable level with these measures, may need referral to a pain clinic -We'll have gently and check in on her referral for the drooping eyebrow and in sure her prescriptions are sent away she would like -Advised that she take all of her medications daily, follow-up in 1-2 months -Patient advised to return or notify a doctor  immediately if symptoms worsen or persist or new concerns arise.  Patient Instructions  BEFORE YOU LEAVE: -[name], please check on referral to plastic surgery for droopy eyebrow -[name], please check that all blood pressure and diabetes medications are on 90 day refill schedule -please provide her number for pulmonology office to call about her CPAP -follow up: 1-2 months to recheck blood pressure and to see how she is doing with Cymbalta  Please start CPAP as soon as possible.  Start the Cymbalta and take once daily for pain.  Call Dr. Jeralyn Ruths office about your back.  Limit opioid pain medication (Norco) to rare use.  Take blood pressure medications every day.   We recommend the following healthy lifestyle for LIFE: 1) Small portions.   Tip: eat off of a salad plate instead of a dinner plate.  Tip: It is ok to feel hungry after a meal - that likely means you ate an appropriate portion.  Tip: if you need more or a snack choose fruits, veggies and/or a handful of nuts or seeds.  2) Eat a healthy clean diet.  * Tip: Avoid (less then 1 serving per week): processed foods, sweets, sweetened drinks, white starches (rice, flour, bread, potatoes, pasta, etc), red meat, fast foods, butter  *Tip: CHOOSE instead   * 5-9 servings per day of fresh or frozen fruits and vegetables (but not corn, potatoes, bananas, canned or dried fruit)   *nuts and seeds, beans   *olives and olive oil   *small portions of lean meats such as fish and white chicken    *small portions of whole grains  3)Get at least 150 minutes of sweaty aerobic exercise per week.  4)Reduce stress - consider counseling, meditation and relaxation to balance other aspects of your life.      Colin Benton R., DO

## 2016-04-06 ENCOUNTER — Encounter: Payer: Self-pay | Admitting: Family Medicine

## 2016-04-06 ENCOUNTER — Ambulatory Visit (INDEPENDENT_AMBULATORY_CARE_PROVIDER_SITE_OTHER): Payer: Commercial Managed Care - HMO | Admitting: Family Medicine

## 2016-04-06 VITALS — BP 136/90 | HR 75 | Temp 97.9°F | Ht 61.0 in | Wt 192.5 lb

## 2016-04-06 DIAGNOSIS — F330 Major depressive disorder, recurrent, mild: Secondary | ICD-10-CM

## 2016-04-06 DIAGNOSIS — G894 Chronic pain syndrome: Secondary | ICD-10-CM

## 2016-04-06 DIAGNOSIS — G4733 Obstructive sleep apnea (adult) (pediatric): Secondary | ICD-10-CM | POA: Diagnosis not present

## 2016-04-06 DIAGNOSIS — I10 Essential (primary) hypertension: Secondary | ICD-10-CM

## 2016-04-06 DIAGNOSIS — M1990 Unspecified osteoarthritis, unspecified site: Secondary | ICD-10-CM | POA: Diagnosis not present

## 2016-04-06 DIAGNOSIS — G2581 Restless legs syndrome: Secondary | ICD-10-CM

## 2016-04-06 MED ORDER — FUROSEMIDE 40 MG PO TABS: 40.0000 mg | ORAL_TABLET | Freq: Every day | ORAL | 1 refills | Status: DC

## 2016-04-06 MED ORDER — SITAGLIPTIN PHOSPHATE 100 MG PO TABS: 100.0000 mg | ORAL_TABLET | Freq: Every day | ORAL | 1 refills | Status: DC

## 2016-04-06 MED ORDER — LISINOPRIL 20 MG PO TABS: 20.0000 mg | ORAL_TABLET | Freq: Every day | ORAL | 1 refills | Status: DC

## 2016-04-06 MED ORDER — CARVEDILOL 3.125 MG PO TABS: 3.1250 mg | ORAL_TABLET | Freq: Two times a day (BID) | ORAL | 1 refills | Status: DC

## 2016-04-06 MED ORDER — METFORMIN HCL 1000 MG PO TABS: 1000.0000 mg | ORAL_TABLET | Freq: Two times a day (BID) | ORAL | 2 refills | Status: DC

## 2016-04-06 MED ORDER — INSULIN GLARGINE 100 UNIT/ML SOLOSTAR PEN: 12.0000 [IU] | PEN_INJECTOR | Freq: Every day | SUBCUTANEOUS | 1 refills | Status: DC

## 2016-04-06 MED ORDER — DULOXETINE HCL 30 MG PO CPEP: 30.0000 mg | ORAL_CAPSULE | Freq: Every day | ORAL | 1 refills | Status: DC

## 2016-04-06 MED FILL — LANTUS SOLOSTAR 100 UNITS/M: 100 | 125 days supply | Qty: 15 | Fill #0

## 2016-04-06 NOTE — Patient Instructions (Addendum)
BEFORE YOU LEAVE: [name], please check on referral to plastic surgery for droopy eyebrow -[name], please check that all blood pressure and diabetes medications are on 90 day refill schedule -please provide her number for pulmonology office to call about her CPAP -follow up: 1-2 months to recheck blood pressure and to see how she is doing with Cymbalta  Please start CPAP as soon as possible.  Start the Cymbalta and take once daily for pain.  Call Dr. Jeralyn Ruths office about your back.  Limit opioid pain medication (Norco) to rare use.  Take blood pressure medications every day.   We recommend the following healthy lifestyle for LIFE: 1) Small portions.   Tip: eat off of a salad plate instead of a dinner plate.  Tip: It is ok to feel hungry after a meal - that likely means you ate an appropriate portion.  Tip: if you need more or a snack choose fruits, veggies and/or a handful of nuts or seeds.  2) Eat a healthy clean diet.  * Tip: Avoid (less then 1 serving per week): processed foods, sweets, sweetened drinks, white starches (rice, flour, bread, potatoes, pasta, etc), red meat, fast foods, butter  *Tip: CHOOSE instead   * 5-9 servings per day of fresh or frozen fruits and vegetables (but not corn, potatoes, bananas, canned or dried fruit)   *nuts and seeds, beans   *olives and olive oil   *small portions of lean meats such as fish and white chicken    *small portions of whole grains  3)Get at least 150 minutes of sweaty aerobic exercise per week.  4)Reduce stress - consider counseling, meditation and relaxation to balance other aspects of your life.

## 2016-04-06 NOTE — Progress Notes (Signed)
Pre visit review using our clinic review tool, if applicable. No additional management support is needed unless otherwise documented below in the visit note. 

## 2016-04-14 ENCOUNTER — Telehealth: Payer: Self-pay

## 2016-04-14 NOTE — Telephone Encounter (Signed)
LMOM TCB x1   Since pt. Has not received her cpap machine, she does not need to come in tomorrow she needs to set up an appointment once she has it in 6-8 weeks

## 2016-04-15 ENCOUNTER — Ambulatory Visit: Payer: Commercial Managed Care - HMO | Admitting: Pulmonary Disease

## 2016-05-12 DIAGNOSIS — G4733 Obstructive sleep apnea (adult) (pediatric): Secondary | ICD-10-CM | POA: Diagnosis not present

## 2016-05-22 ENCOUNTER — Telehealth: Payer: Self-pay | Admitting: *Deleted

## 2016-05-22 ENCOUNTER — Encounter: Payer: Self-pay | Admitting: Family Medicine

## 2016-05-22 ENCOUNTER — Ambulatory Visit (INDEPENDENT_AMBULATORY_CARE_PROVIDER_SITE_OTHER): Payer: Medicare HMO | Admitting: Family Medicine

## 2016-05-22 VITALS — BP 150/100 | HR 75 | Temp 97.6°F | Ht 61.0 in | Wt 193.6 lb

## 2016-05-22 DIAGNOSIS — B9789 Other viral agents as the cause of diseases classified elsewhere: Secondary | ICD-10-CM | POA: Diagnosis not present

## 2016-05-22 DIAGNOSIS — J069 Acute upper respiratory infection, unspecified: Secondary | ICD-10-CM | POA: Diagnosis not present

## 2016-05-22 DIAGNOSIS — I5032 Chronic diastolic (congestive) heart failure: Secondary | ICD-10-CM | POA: Diagnosis not present

## 2016-05-22 DIAGNOSIS — E6609 Other obesity due to excess calories: Secondary | ICD-10-CM

## 2016-05-22 DIAGNOSIS — F330 Major depressive disorder, recurrent, mild: Secondary | ICD-10-CM

## 2016-05-22 DIAGNOSIS — G4733 Obstructive sleep apnea (adult) (pediatric): Secondary | ICD-10-CM

## 2016-05-22 DIAGNOSIS — E1140 Type 2 diabetes mellitus with diabetic neuropathy, unspecified: Secondary | ICD-10-CM | POA: Diagnosis not present

## 2016-05-22 DIAGNOSIS — I10 Essential (primary) hypertension: Secondary | ICD-10-CM | POA: Diagnosis not present

## 2016-05-22 DIAGNOSIS — Z6836 Body mass index (BMI) 36.0-36.9, adult: Secondary | ICD-10-CM

## 2016-05-22 DIAGNOSIS — G894 Chronic pain syndrome: Secondary | ICD-10-CM

## 2016-05-22 LAB — BASIC METABOLIC PANEL
BUN: 10 mg/dL (ref 6–23)
CALCIUM: 9.1 mg/dL (ref 8.4–10.5)
CO2: 35 mEq/L — ABNORMAL HIGH (ref 19–32)
Chloride: 97 mEq/L (ref 96–112)
Creatinine, Ser: 0.49 mg/dL (ref 0.40–1.20)
GFR: 130.1 mL/min (ref >60.00)
GLUCOSE: 144 mg/dL — AB (ref 70–99)
POTASSIUM: 3.2 meq/L — AB (ref 3.5–5.1)
Sodium: 139 mEq/L (ref 135–145)

## 2016-05-22 LAB — CBC
HCT: 39.1 % (ref 36.0–46.0)
HEMOGLOBIN: 13 g/dL (ref 12.0–15.0)
MCHC: 33.4 g/dL (ref 30.0–36.0)
MCV: 87.8 fl (ref 78.0–100.0)
Platelets: 271 10*3/uL (ref 150.0–400.0)
RBC: 4.45 Mil/uL (ref 3.87–5.11)
RDW: 14.4 % (ref 11.5–15.5)
WBC: 11.6 10*3/uL — ABNORMAL HIGH (ref 4.0–10.5)

## 2016-05-22 LAB — POC INFLUENZA A&B (BINAX/QUICKVUE)
INFLUENZA B, POC: NEGATIVE
Influenza A, POC: NEGATIVE

## 2016-05-22 LAB — HEMOGLOBIN A1C: Hgb A1c MFr Bld: 8.7 % — ABNORMAL HIGH (ref 4.6–6.5)

## 2016-05-22 MED ORDER — DULOXETINE HCL 20 MG PO CPEP
20.0000 mg | ORAL_CAPSULE | Freq: Every day | ORAL | 3 refills | Status: DC
Start: 2016-05-22 — End: 2017-03-23

## 2016-05-22 MED ORDER — INSULIN PEN NEEDLE 32G X 4 MM MISC: 1 refills | Status: DC

## 2016-05-22 NOTE — Progress Notes (Signed)
HPI:  [name] is a pleasant 76 yo with a complicated PMH significant for DM, CHF, OSA, Chronic pain (sees Dr .Nelva Bush), hx breast Ca, MDD, Anxiety and poor compliance here for follow up. At her last visit we started her on Cymbalta for depression and chronic pain and advised follow up w/ Dr. Nelva Bush for her back issues, her primary area of pain. Advised good control of her diabetes and CPAP. Reports she tried CPAP for a few days but then got a cold and so stopped using it. The cold symptoms started about 3 days ago include nasal congestion, postnasal drip and a cough. No fevers, body aches or shortness of breath. She tried Cymbalta for a few days and felt like it made her sleep too much, so she also stopped it. She is not taking Januvia. She reports that they called the plastic surgery office about her eyebrow droop, but that they were told she would have to see somebody else first. She does not remember who they told her to say. She is seeing Dr. Nelva Bush in a few days. She did not take her blood pressure medications this morning. She loves to eat sugar and sweets, and reports her blood sugars have not been great, around 170. She is not sure why she stopped the Januvia. Due for labs.   ROS: See pertinent positives and negatives per HPI.  Past Medical History:  Diagnosis Date  . AK (actinic keratosis)   . Anxiety    occasional  . Breast cancer ([place])    R breast, s/p radiation 34Gy/32fx 04/29/10-05/05/10  . CTS (carpal tunnel syndrome)    Left  . Diabetes ([place])   . Diastolic CHF ([place])    Dx w/ hospitalization 06/2015 for respiratory failure  . Falls frequently    "can not pick left leg up"  . Foot fracture   . Hearing loss    bilateral hearing aides  . History of environmental allergies   . History of kidney stones 20 years ago  . Hypertension   . MDD (major depressive disorder)   . No blood products ([place])  09/11/2013  . Osteoarthritis    hx shoulder, knee, back, wrist  pain; saw Dr. Wynelle Link   . RESTLESS LEGS SYNDROME 05/05/2007   Qualifier: Diagnosis of  By: Gwenette Greet MD, Armando Reichert   . RLS (restless legs syndrome)   . Sleep apnea     Past Surgical History:  Procedure Laterality Date  . APPENDECTOMY  age 44  . BREAST SURGERY  2012   rt breast lumpectomy  . flex signoidoscopy    . INCISION AND DRAINAGE PERIRECTAL ABSCESS    . JOINT REPLACEMENT Right 2006   knee  . TOTAL KNEE ARTHROPLASTY Left 08/06/2014   Procedure: LEFT TOTAL KNEE ARTHROPLASTY;  Surgeon: Gaynelle Arabian, MD;  Location: [facility];  Service: Orthopedics;  Laterality: Left;  . TUBAL LIGATION  1979    Family History  Problem Relation Age of Onset  . Cancer Sister     breast  . Heart disease Paternal Grandfather   . Diabetes Other   . Obesity Other   . Heart disease Brother     Valve  . Alcohol abuse Father     Social History   Social History  . Marital status: Married    Spouse name: N/A  . Number of children: 3  . Years of education: N/A   Social History Main Topics  . Smoking status: Never Smoker  . Smokeless tobacco: Never  Used  . Alcohol use No  . Drug use: No  . Sexual activity: Yes   Other Topics Concern  . None   Social History Narrative         Home Situation: lives with husband and granddaughter      Spiritual Beliefs: Jehovah Witness - no blood products                    Current Outpatient Prescriptions:  .  anastrozole (ARIMIDEX) 1 MG tablet, TAKE ONE TABLET BY MOUTH ONCE DAILY, Disp: 30 tablet, Rfl: 11 .  carvedilol (COREG) 3.125 MG tablet, Take 1 tablet (3.125 mg total) by mouth 2 (two) times daily with a meal., Disp: 180 tablet, Rfl: 1 .  cholecalciferol (VITAMIN D) 1000 units tablet, Take 1,000 Units by mouth 2 (two) times daily., Disp: , Rfl:  .  diphenhydramine-acetaminophen (TYLENOL PM) 25-500 MG TABS, Take 1 tablet by mouth at bedtime as needed (for pain /sleep)., Disp: , Rfl:  .  furosemide (LASIX) 40 MG tablet, Take 1 tablet (40 mg total)  by mouth daily., Disp: 90 tablet, Rfl: 1 .  HYDROcodone-acetaminophen (NORCO) 7.5-325 MG tablet, Take 1 tablet by mouth every 6 (six) hours as needed for moderate pain., Disp: 10 tablet, Rfl: 0 .  Insulin Glargine (LANTUS SOLOSTAR) 100 UNIT/ML Solostar Pen, Inject 12 Units into the skin daily at 10 pm., Disp: 5 pen, Rfl: 1 .  Insulin Pen Needle (BD PEN NEEDLE NANO U/F) 32G X 4 MM MISC, Use as directed once a day, Disp: 100 each, Rfl: 1 .  lisinopril (PRINIVIL,ZESTRIL) 20 MG tablet, Take 1 tablet (20 mg total) by mouth daily., Disp: 90 tablet, Rfl: 1 .  metFORMIN (GLUCOPHAGE) 1000 MG tablet, Take 1 tablet (1,000 mg total) by mouth 2 (two) times daily., Disp: 180 tablet, Rfl: 2 .  methocarbamol (ROBAXIN) 500 MG tablet, Take 500 mg by mouth every 6 (six) hours as needed for muscle spasms. Reported on 09/25/2015, Disp: , Rfl:  .  potassium chloride (K-DUR) 10 MEQ tablet, Take 1 tablet (10 mEq total) by mouth daily., Disp: 30 tablet, Rfl: 11 .  pramipexole (MIRAPEX) 1 MG tablet, TAKE ONE TABLET BY MOUTH TWICE DAILY, Disp: 180 tablet, Rfl: 1 .  Pyridoxine HCl (VITAMIN B-6 PO), Take by mouth., Disp: , Rfl:  .  DULoxetine (CYMBALTA) 20 MG capsule, Take 1 capsule (20 mg total) by mouth daily., Disp: 30 capsule, Rfl: 3  EXAM:  Vitals:   05/22/16 1058  BP: (!) 150/100  Pulse: 75  Temp: 97.6 F (36.4 C)    Body mass index is 36.58 kg/m.  GENERAL: vitals reviewed and listed above, alert, oriented, appears well hydrated and in no acute distress  HEENT: atraumatic, conjunttiva clear, no obvious abnormalities on inspection of external nose and ears, normal appearance of ear canals and TMs, clear nasal congestion, mild post oropharyngeal erythema with PND, no tonsillar edema or exudate, no sinus TTP  NECK: no obvious masses on inspection  LUNGS: clear to auscultation bilaterally, no wheezes, rales or rhonchi, good air movement  CV: HRRR, no peripheral edema  MS: moves all extremities without  noticeable abnormality  PSYCH: pleasant and cooperative, no obvious depression or anxiety  ASSESSMENT AND PLAN:  Discussed the following assessment and plan:  Essential hypertension - Plan: Basic metabolic panel, CBC  Obstructive sleep apnea  Type 2 diabetes mellitus with diabetic neuropathy, without long-term current use of insulin (HCC) - Plan: Hemoglobin [id number]  Chronic diastolic congestive heart failure (  HCC)  Mild episode of recurrent major depressive disorder (HCC)  Chronic pain syndrome  Class 2 obesity due to excess calories without serious comorbidity with body mass index (BMI) of 36.0 to 36.9 in adult  Viral upper respiratory illness - Plan: POC Influenza A&B(BINAX/QUICKVUE)  -flu test neg, symptomatic care for likely VURI with return precuations -advised giving a longer trial on the CPAP once feeling better and follow up with pulm if difficulty tolerating -advised starting lower dose of cymbalta and trying for a few weeks with a call to [place] if any concerns or side effects -lifestyle recs -labs today -advised assistant to check with plastic surgery regarding referral - pt desires surgical correction trauma related L eyebrow droop -she will be following up with her specialist regarding the back pain -BP high, improved some on recheck, did not take BP medications - advise follow up in a few weeks to recheck ON medications -stressed important medication compliance -Patient advised to return or notify a doctor immediately if symptoms worsen or persist or new concerns arise.  Patient Instructions  BEFORE YOU LEAVE: -recheck blood pressure - if elevated then advise BP nurse visit for recheck in 2 weeks on medications -rapid flu test -call plastic surgery office to see what they advised for drooping eyebrow -labs -follow up: 3 months  Start CPAP and try for 1 month  Start lower dose of Cymbalta 20mg  daily and try for 1 month - call if having difficulty taking  See Dr.  Nelva Bush as planned.  For the cold - can use nasal saline, cough lozenges, humidifier (clean properly); follow up if worsening or not improving over the next 5-7 days.  We have ordered labs or studies at this visit. It can take up to 1-2 weeks for results and processing. IF results require follow up or explanation, we will call you with instructions. Clinically stable results will be released to your [facility]. If you have not heard from [place] or cannot find your results in [facility] in 2 weeks please contact our office at [phone].  If you are not yet signed up for [facility], please consider signing up.   We recommend the following healthy lifestyle for LIFE: 1) Small portions.   Tip: eat off of a salad plate instead of a dinner plate.  Tip: It is ok to feel hungry after a meal of proper portion sizes  Tip: if you need more or a snack choose fruits, veggies and/or a handful of nuts or seeds.  2) Eat a healthy clean diet.  * Tip: Avoid (less then 1 serving per week): processed foods, sweets, sweetened drinks, white starches (rice, flour, bread, potatoes, pasta, etc), red meat, fast foods, butter  *Tip: CHOOSE instead   * 5-9 servings per day of fresh or frozen fruits and vegetables (but not corn, potatoes, bananas, canned or dried fruit)   *nuts and seeds, beans   *olives and olive oil   *small portions of lean meats such as fish and white chicken    *small portions of whole grains  3)Get at least 150 minutes of sweaty aerobic exercise per week.  4)Reduce stress - consider counseling, meditation and relaxation to balance other aspects of your life.          Colin Benton R., DO

## 2016-05-22 NOTE — Telephone Encounter (Signed)
I called the [facility] for [name] and spoke with Kathlee Nations and she stated the physician there recommended the pt see an ophthalmologist first for the droopy eyelid.  I informed the pt and her daughter of this at her appt today.

## 2016-05-22 NOTE — Progress Notes (Signed)
Pre visit review using our clinic review tool, if applicable. No additional management support is needed unless otherwise documented below in the visit note. 

## 2016-05-22 NOTE — Patient Instructions (Addendum)
BEFORE YOU LEAVE: -recheck blood pressure - if elevated then advise BP nurse visit for recheck in 2 weeks on medications -rapid flu test -call plastic surgery office to see what they advised for drooping eyebrow -labs -follow up: 3 months  Start CPAP and try for 1 month  Start lower dose of Cymbalta 20mg  daily and try for 1 month - call if having difficulty taking  See Dr. Nelva Bush as planned.  For the cold - can use nasal saline, cough lozenges, humidifier (clean properly); follow up if worsening or not improving over the next 5-7 days.  We have ordered labs or studies at this visit. It can take up to 1-2 weeks for results and processing. IF results require follow up or explanation, we will call you with instructions. Clinically stable results will be released to your [facility]. If you have not heard from [place] or cannot find your results in [facility] in 2 weeks please contact our office at [phone].  If you are not yet signed up for [facility], please consider signing up.   We recommend the following healthy lifestyle for LIFE: 1) Small portions.   Tip: eat off of a salad plate instead of a dinner plate.  Tip: It is ok to feel hungry after a meal of proper portion sizes  Tip: if you need more or a snack choose fruits, veggies and/or a handful of nuts or seeds.  2) Eat a healthy clean diet.  * Tip: Avoid (less then 1 serving per week): processed foods, sweets, sweetened drinks, white starches (rice, flour, bread, potatoes, pasta, etc), red meat, fast foods, butter  *Tip: CHOOSE instead   * 5-9 servings per day of fresh or frozen fruits and vegetables (but not corn, potatoes, bananas, canned or dried fruit)   *nuts and seeds, beans   *olives and olive oil   *small portions of lean meats such as fish and white chicken    *small portions of whole grains  3)Get at least 150 minutes of sweaty aerobic exercise per week.  4)Reduce stress - consider counseling, meditation and relaxation to  balance other aspects of your life.

## 2016-05-27 DIAGNOSIS — M5136 Other intervertebral disc degeneration, lumbar region: Secondary | ICD-10-CM | POA: Diagnosis not present

## 2016-05-27 DIAGNOSIS — M4316 Spondylolisthesis, lumbar region: Secondary | ICD-10-CM | POA: Diagnosis not present

## 2016-05-28 NOTE — Addendum Note (Signed)
Addended by: Agnes Lawrence on: 05/28/2016 10:09 AM   Modules accepted: Orders

## 2016-05-29 DIAGNOSIS — H02834 Dermatochalasis of left upper eyelid: Secondary | ICD-10-CM | POA: Diagnosis not present

## 2016-05-30 DIAGNOSIS — M5136 Other intervertebral disc degeneration, lumbar region: Secondary | ICD-10-CM | POA: Diagnosis not present

## 2016-06-01 ENCOUNTER — Other Ambulatory Visit: Payer: Self-pay | Admitting: Family Medicine

## 2016-06-01 DIAGNOSIS — Z853 Personal history of malignant neoplasm of breast: Secondary | ICD-10-CM

## 2016-06-05 ENCOUNTER — Ambulatory Visit (INDEPENDENT_AMBULATORY_CARE_PROVIDER_SITE_OTHER): Payer: Medicare HMO

## 2016-06-05 VITALS — BP 152/92

## 2016-06-05 DIAGNOSIS — I10 Essential (primary) hypertension: Secondary | ICD-10-CM

## 2016-06-05 NOTE — Progress Notes (Addendum)
     Pt returned to office for blood pressure check per Dr. Julianne Rice order from [place] 05/22/16. BP remains elevated at 152/92. Pt admits to not taking medications as directed as sometimes she "forgets the evening dose". Per Dr. Maudie Mercury pt advised to take medications as directed for full efficacy.

## 2016-06-09 DIAGNOSIS — M48062 Spinal stenosis, lumbar region with neurogenic claudication: Secondary | ICD-10-CM | POA: Diagnosis not present

## 2016-06-09 DIAGNOSIS — M5136 Other intervertebral disc degeneration, lumbar region: Secondary | ICD-10-CM | POA: Diagnosis not present

## 2016-06-09 DIAGNOSIS — M4316 Spondylolisthesis, lumbar region: Secondary | ICD-10-CM | POA: Diagnosis not present

## 2016-06-10 ENCOUNTER — Ambulatory Visit
Admission: RE | Admit: 2016-06-10 | Discharge: 2016-06-10 | Disposition: A | Discharge status: Home / Self Care | Payer: Medicare HMO | Source: Ambulatory Visit | Attending: Family Medicine | Admitting: Family Medicine

## 2016-06-10 DIAGNOSIS — R928 Other abnormal and inconclusive findings on diagnostic imaging of breast: Secondary | ICD-10-CM | POA: Diagnosis not present

## 2016-06-10 DIAGNOSIS — Z853 Personal history of malignant neoplasm of breast: Secondary | ICD-10-CM

## 2016-06-12 DIAGNOSIS — G4733 Obstructive sleep apnea (adult) (pediatric): Secondary | ICD-10-CM | POA: Diagnosis not present

## 2016-06-19 NOTE — Progress Notes (Signed)
Vanessa Pyeatt R., DO  

## 2016-06-21 NOTE — Progress Notes (Signed)
I have signed! Is it not showing up?

## 2016-07-08 ENCOUNTER — Encounter: Payer: Medicare HMO | Attending: Family Medicine | Admitting: *Deleted

## 2016-07-08 DIAGNOSIS — Z713 Dietary counseling and surveillance: Secondary | ICD-10-CM | POA: Insufficient documentation

## 2016-07-08 DIAGNOSIS — E1140 Type 2 diabetes mellitus with diabetic neuropathy, unspecified: Secondary | ICD-10-CM | POA: Diagnosis not present

## 2016-07-13 DIAGNOSIS — G4733 Obstructive sleep apnea (adult) (pediatric): Secondary | ICD-10-CM | POA: Diagnosis not present

## 2016-07-14 ENCOUNTER — Telehealth: Payer: Self-pay | Admitting: Family Medicine

## 2016-07-14 MED ORDER — INSULIN GLARGINE 100 UNIT/ML SOLOSTAR PEN: 16.0000 [IU] | PEN_INJECTOR | Freq: Every day | SUBCUTANEOUS | 3 refills | Status: DC

## 2016-07-14 MED FILL — LANTUS SOLOSTAR 100 UNITS/M: 100 | 90 days supply | Qty: 15 | Fill #0

## 2016-07-14 NOTE — Telephone Encounter (Signed)
° ° °  Pt request refill of the following:  Insulin Glargine (LANTUS SOLOSTAR) 100 UNIT/ML Solostar Pen   Phamacy:   [name]

## 2016-07-14 NOTE — Telephone Encounter (Signed)
Rx done. 

## 2016-07-16 ENCOUNTER — Ambulatory Visit (INDEPENDENT_AMBULATORY_CARE_PROVIDER_SITE_OTHER): Payer: Medicare HMO | Admitting: Adult Health

## 2016-07-16 ENCOUNTER — Encounter: Payer: Self-pay | Admitting: Adult Health

## 2016-07-16 DIAGNOSIS — G4733 Obstructive sleep apnea (adult) (pediatric): Secondary | ICD-10-CM | POA: Diagnosis not present

## 2016-07-16 NOTE — Patient Instructions (Signed)
Plan:  Aim for 2-3 Carb Choices per meal (30-45 grams)   Aim for 0-1 Carbs per snack if hungry  Include protein in moderation with your meals and snacks Consider reading food labels for Total Carbohydrate of foods Consider  increasing your activity level by walking as able daily as tolerated Consider checking BG at alternate times per day   Consider taking medication, Lantus, at the same time every night since it works in your body for about 24 hours. You can decide what time works best for you, just let your doctor know.

## 2016-07-16 NOTE — Progress Notes (Signed)
Diabetes Self-Management Education  Visit Type: First/Initial  Appt. Start Time: 1530 Appt. End Time: 1630  07/16/2016  [name], identified by name and date of birth, is a 76 y.o. female with a diagnosis of Diabetes: Type 2. She is here with her husband, She isn't sure how long she has had Diabetes but it has been several years. She states her meter isn't working right now, she doesn't know how to get it fixed and she didn't bring it today. She tends to eat 3 meals a day and desserts only occasionally. She states she has trouble taking her Lantus insulin at 10 PM every night.   ASSESSMENT  There were no vitals taken for this visit. Patient declined when asked There is no height or weight on file to calculate BMI.      Diabetes Self-Management Education - 07/08/16 1524      Visit Information   Visit Type First/Initial     Initial Visit   Diabetes Type Type 2   Are you currently following a meal plan? No   Are you taking your medications as prescribed? Yes   Date Diagnosed unsure     Health Coping   How would you rate your overall health? Fair     Psychosocial Assessment   Patient Belief/Attitude about Diabetes Motivated to manage diabetes   Other persons present Patient;Spouse/SO   Special Needs Simplified materials   What is the last grade level you completed in school? 13     Pre-Education Assessment   Patient understands the diabetes disease and treatment process. Needs Instruction   Patient understands incorporating nutritional management into lifestyle. Needs Instruction   Patient undertands incorporating physical activity into lifestyle. Needs Instruction   Patient understands using medications safely. Needs Instruction   Patient understands monitoring blood glucose, interpreting and using results Needs Instruction   Patient understands prevention, detection, and treatment of acute complications. Needs Instruction   Patient understands prevention, detection, and  treatment of chronic complications. Needs Instruction   Patient understands how to develop strategies to address psychosocial issues. Needs Instruction   Patient understands how to develop strategies to promote health/change behavior. Needs Instruction     Complications   Last HgB A1C per patient/outside source 7.7 %   How often do you check your blood sugar? 0 times/day (not testing)  meter not working right now   Have you had a dilated eye exam in the past 12 months? Yes   Have you had a dental exam in the past 12 months? Yes   Are you checking your feet? No     Dietary Intake   Breakfast skips usually, toast with PNB and jelly   Lunch eats out often, vegetable and fried chicken, and biscuit   Snack (afternoon) popcorn, PNB crackers,    Dinner eat at home: meat, vegetables occasionally bread   Snack (evening) pie occasionally   Beverage(s) diet soda     Exercise   Exercise Type ADL's   How many days per week to you exercise? 0   How many minutes per day do you exercise? 0   Total minutes per week of exercise 0     Patient Education   Previous Diabetes Education Yes (please comment)   Nutrition management  Carbohydrate counting;Role of diet in the treatment of diabetes and the relationship between the three main macronutrients and blood glucose level   Physical activity and exercise  Role of exercise on diabetes management, blood pressure control and cardiac health.  Medications Reviewed patients medication for diabetes, action, purpose, timing of dose and side effects.   Monitoring Identified appropriate SMBG and/or A1C goals.   Acute complications Taught treatment of hypoglycemia - the 15 rule.   Psychosocial adjustment Worked with patient to identify barriers to care and solutions     Individualized Goals (developed by patient)   Nutrition General guidelines for healthy choices and portions discussed   Physical Activity 15 minutes per day   Medications take my medication as  prescribed   Monitoring  test my blood glucose as discussed  Ask pharmacist about fixing your meter or bring back to this office     Post-Education Assessment   Patient understands incorporating nutritional management into lifestyle. Demonstrates understanding / competency   Patient undertands incorporating physical activity into lifestyle. Needs Review   Patient understands using medications safely. Demonstrates understanding / competency  take insulin at consistent time each day, you can pick the time   Patient understands monitoring blood glucose, interpreting and using results Needs Review     Outcomes   Expected Outcomes Demonstrated interest in learning. Expect positive outcomes   Future DMSE PRN   Program Status Completed      Individualized Plan for Diabetes Self-Management Training:   Learning Objective:  Patient will have a greater understanding of diabetes self-management. Patient education plan is to attend individual and/or group sessions per assessed needs and concerns.   Plan:   Patient Instructions  Plan:  Aim for 2-3 Carb Choices per meal (30-45 grams)   Aim for 0-1 Carbs per snack if hungry  Include protein in moderation with your meals and snacks Consider reading food labels for Total Carbohydrate of foods Consider  increasing your activity level by walking as able daily as tolerated Consider checking BG at alternate times per day   Consider taking medication, Lantus, at the same time every night since it works in your body for about 24 hours. You can decide what time works best for you, just let your doctor know.  Expected Outcomes:  Demonstrated interest in learning. Expect positive outcomes  Education material provided: Living Well with Diabetes, Meal plan card, My Plate and Carbohydrate counting sheet, Insulin Action handout  If problems or questions, patient to contact team via:  Phone and Email  Future DSME appointment: PRN

## 2016-07-16 NOTE — Patient Instructions (Signed)
Try to wear CPAP every night for at least 4hr . May wear with naps.  Do not drive if sleepy .  Work on weight loss.  Follow up with Dr. Murlean Iba in 4 months and As needed

## 2016-07-16 NOTE — Progress Notes (Signed)
@Patient  ID: [name], female    DOB: [birth year], 76 y.o.   MRN: [id number]  Chief Complaint  Patient presents with  . Follow-up    OSA     Referring provider: [name], DO  HPI: 76 yo female seen For sleep consult October 2017 found to have OSA    TEST  Sleep study 02/2016 AHI 61/hr , (only 34 min of sleep time)   07/16/2016  Follow up : OSA  Patient returns for a follow-up for sleep apnea. Patient was seen in October 2017 for sleep disturbances daytime sleepiness and snoring. Patient has a lot of difficulty sleeping due to chronic pain. She has severe back and leg pain to keep her awake. Patient underwent a sleep study 03/13/2016 that showed severe sleep apnea with AHI of 61/Hr . However, her sleep study only showed 34 minutes of sleep time. Patient was recommended to begin C Pap at bedtime. She has started this. However, she says that she has difficulty wearing it. Mainly due to her chronic pain. Download shows 80% usage with average sleep at 2 hours. AHI 5.3. She is on AutoSet 5-15 cm of H2O. Patient says that she does nap. I have suggested that she wear her C Pap. During that time. We also talked about mask change. However, she likes her new mask and was to continue to try to wear it more, more each night.  Allergies  Allergen Reactions  . Sulfonamide Derivatives Swelling    Lips Swelling  . Other     BLOOD PRODUCT REFUSAL    Immunization History  Administered Date(s) Administered  . Influenza Split 02/20/2011, 01/25/2012  . Influenza Whole 02/16/1999, 02/25/2007, 03/07/2009, 02/14/2010  . Influenza, High Dose Seasonal PF 02/17/2016  . Influenza,inj,Quad PF,36+ Mos 02/01/2013, 02/19/2014, 01/30/2015  . Pneumococcal Conjugate-13 01/30/2015  . Pneumococcal Polysaccharide-23 10/11/2013  . Td 05/18/2005  . Tdap 10/21/2015  . Zoster 02/20/2011    Past Medical History:  Diagnosis Date  . AK (actinic keratosis)   . Anxiety    occasional  . Breast cancer ([place])      R breast, s/p radiation 34Gy/17fx 04/29/10-05/05/10  . CTS (carpal tunnel syndrome)    Left  . Diabetes ([place])   . Diastolic CHF ([place])    Dx w/ hospitalization 06/2015 for respiratory failure  . Falls frequently    "can not pick left leg up"  . Foot fracture   . Hearing loss    bilateral hearing aides  . History of environmental allergies   . History of kidney stones 20 years ago  . Hypertension   . MDD (major depressive disorder)   . No blood products ([place])  09/11/2013  . Osteoarthritis    hx shoulder, knee, back, wrist pain; saw Dr. Wynelle Link   . RESTLESS LEGS SYNDROME 05/05/2007   Qualifier: Diagnosis of  By: Gwenette Greet MD, Armando Reichert   . RLS (restless legs syndrome)   . Sleep apnea     Tobacco History: History  Smoking Status  . Never Smoker  Smokeless Tobacco  . Never Used   Counseling given: Not Answered   Outpatient Encounter Prescriptions as of 07/16/2016  Medication Sig  . anastrozole (ARIMIDEX) 1 MG tablet TAKE ONE TABLET BY MOUTH ONCE DAILY  . carvedilol (COREG) 3.125 MG tablet Take 1 tablet (3.125 mg total) by mouth 2 (two) times daily with a meal.  . cholecalciferol (VITAMIN D) 1000 units tablet Take 1,000 Units by mouth 2 (two) times daily.  Marland Kitchen  diphenhydramine-acetaminophen (TYLENOL PM) 25-500 MG TABS Take 1 tablet by mouth at bedtime as needed (for pain /sleep).  . DULoxetine (CYMBALTA) 20 MG capsule Take 1 capsule (20 mg total) by mouth daily.  . furosemide (LASIX) 40 MG tablet Take 1 tablet (40 mg total) by mouth daily.  Marland Kitchen HYDROcodone-acetaminophen (NORCO) 7.5-325 MG tablet Take 1 tablet by mouth every 6 (six) hours as needed for moderate pain.  . Insulin Glargine (LANTUS SOLOSTAR) 100 UNIT/ML Solostar Pen Inject 16 Units into the skin daily at 10 pm.  . Insulin Pen Needle (BD PEN NEEDLE NANO U/F) 32G X 4 MM MISC Use as directed once a day  . lisinopril (PRINIVIL,ZESTRIL) 20 MG tablet Take 1 tablet (20 mg total) by mouth daily.  . metFORMIN (GLUCOPHAGE)  1000 MG tablet Take 1 tablet (1,000 mg total) by mouth 2 (two) times daily.  . methocarbamol (ROBAXIN) 500 MG tablet Take 500 mg by mouth every 6 (six) hours as needed for muscle spasms. Reported on 09/25/2015  . potassium chloride (K-DUR) 10 MEQ tablet Take 1 tablet (10 mEq total) by mouth daily.  . pramipexole (MIRAPEX) 1 MG tablet TAKE ONE TABLET BY MOUTH TWICE DAILY  . Pyridoxine HCl (VITAMIN B-6 PO) Take by mouth.   No facility-administered encounter medications on file as of 07/16/2016.      Review of Systems  Constitutional:   No  weight loss, night sweats,  Fevers, chills,  +fatigue, or  lassitude.  HEENT:   No headaches,  Difficulty swallowing,  Tooth/dental problems, or  Sore throat,                No sneezing, itching, ear ache, nasal congestion, post nasal drip,   CV:  No chest pain,  Orthopnea, PND, swelling in lower extremities, anasarca, dizziness, palpitations, syncope.   GI  No heartburn, indigestion, abdominal pain, nausea, vomiting, diarrhea, change in bowel habits, loss of appetite, bloody stools.   Resp:   No chest wall deformity  Skin: no rash or lesions.  GU: no dysuria, change in color of urine, no urgency or frequency.  No flank pain, no hematuria   MS:  No joint pain or swelling.  No decreased range of motion.  No back pain.    Physical Exam  BP 132/70 (BP Location: Left Arm, Cuff Size: Large)   Pulse 86   Ht 5\' 1"  (1.549 m)   Wt 191 lb 12.8 oz (87 kg)   SpO2 93%   BMI 36.24 kg/m   GEN: A/Ox3; pleasant , NAD elderly , walks with cane    HEENT:  [place]/AT,  EACs-clear, TMs-wnl, NOSE-clear, THROAT-clear, no lesions, no postnasal drip or exudate noted. Class 2-3 MP airway   NECK:  Supple w/ fair ROM; no JVD; normal carotid impulses w/o bruits; no thyromegaly or nodules palpated; no lymphadenopathy.    RESP  Clear  P & A; w/o, wheezes/ rales/ or rhonchi. no accessory muscle use, no dullness to percussion  CARD:  RRR, no m/r/g, no peripheral edema,  pulses intact, no cyanosis or clubbing.  GI:   Soft & nt; nml bowel sounds; no organomegaly or masses detected.   Musco: Warm bil, no deformities or joint swelling noted.   Neuro: alert, no focal deficits noted.    Skin: Warm, no lesions or rashes    Lab Results:  CBC  ProBNP No results found for: PROBNP  Imaging: No results found.   Assessment & Plan:   Obstructive sleep apnea Severe OSA (min sleep time  on sleep study ) . Pt has very poor sleep regimen .  Healthy sleep regimen discussed and have suggested that she wear CPAP w/ naps as well The last 7 days of download do show improved compliance   Plan  Patient Instructions  Try to wear CPAP every night for at least 4hr . May wear with naps.  Do not drive if sleepy .  Work on weight loss.  Follow up with Dr. Murlean Iba in 4 months and As needed          Rexene Edison, NP 07/16/2016

## 2016-07-16 NOTE — Assessment & Plan Note (Signed)
Severe OSA (min sleep time on sleep study ) . Pt has very poor sleep regimen .  Healthy sleep regimen discussed and have suggested that she wear CPAP w/ naps as well The last 7 days of download do show improved compliance   Plan  Patient Instructions  Try to wear CPAP every night for at least 4hr . May wear with naps.  Do not drive if sleepy .  Work on weight loss.  Follow up with Dr. Murlean Iba in 4 months and As needed

## 2016-07-17 ENCOUNTER — Ambulatory Visit: Payer: Medicare HMO | Admitting: Pulmonary Disease

## 2016-07-27 DIAGNOSIS — H903 Sensorineural hearing loss, bilateral: Secondary | ICD-10-CM | POA: Diagnosis not present

## 2016-07-31 ENCOUNTER — Emergency Department (HOSPITAL_COMMUNITY): Payer: Medicare HMO

## 2016-07-31 ENCOUNTER — Encounter (HOSPITAL_COMMUNITY): Payer: Self-pay | Admitting: Emergency Medicine

## 2016-07-31 ENCOUNTER — Inpatient Hospital Stay (HOSPITAL_COMMUNITY)
Admission: EM | Admit: 2016-07-31 | Discharge: 2016-08-04 | DRG: 534 | Disposition: A | Discharge status: Home / Self Care | Payer: Medicare HMO | Attending: Internal Medicine | Admitting: Internal Medicine

## 2016-07-31 DIAGNOSIS — W19XXXA Unspecified fall, initial encounter: Secondary | ICD-10-CM

## 2016-07-31 DIAGNOSIS — R000 Tachycardia, unspecified: Secondary | ICD-10-CM | POA: Diagnosis present

## 2016-07-31 DIAGNOSIS — R030 Elevated blood-pressure reading, without diagnosis of hypertension: Secondary | ICD-10-CM | POA: Diagnosis not present

## 2016-07-31 DIAGNOSIS — F329 Major depressive disorder, single episode, unspecified: Secondary | ICD-10-CM | POA: Diagnosis present

## 2016-07-31 DIAGNOSIS — G8911 Acute pain due to trauma: Secondary | ICD-10-CM | POA: Diagnosis not present

## 2016-07-31 DIAGNOSIS — R062 Wheezing: Secondary | ICD-10-CM

## 2016-07-31 DIAGNOSIS — E876 Hypokalemia: Secondary | ICD-10-CM | POA: Diagnosis present

## 2016-07-31 DIAGNOSIS — R0602 Shortness of breath: Secondary | ICD-10-CM | POA: Diagnosis not present

## 2016-07-31 DIAGNOSIS — Z833 Family history of diabetes mellitus: Secondary | ICD-10-CM | POA: Diagnosis not present

## 2016-07-31 DIAGNOSIS — H9190 Unspecified hearing loss, unspecified ear: Secondary | ICD-10-CM | POA: Diagnosis present

## 2016-07-31 DIAGNOSIS — M1990 Unspecified osteoarthritis, unspecified site: Secondary | ICD-10-CM | POA: Diagnosis not present

## 2016-07-31 DIAGNOSIS — G894 Chronic pain syndrome: Secondary | ICD-10-CM | POA: Diagnosis not present

## 2016-07-31 DIAGNOSIS — M25562 Pain in left knee: Secondary | ICD-10-CM | POA: Diagnosis not present

## 2016-07-31 DIAGNOSIS — R4182 Altered mental status, unspecified: Secondary | ICD-10-CM | POA: Diagnosis not present

## 2016-07-31 DIAGNOSIS — Z9181 History of falling: Secondary | ICD-10-CM

## 2016-07-31 DIAGNOSIS — R5383 Other fatigue: Secondary | ICD-10-CM

## 2016-07-31 DIAGNOSIS — M9712XA Periprosthetic fracture around internal prosthetic left knee joint, initial encounter: Secondary | ICD-10-CM | POA: Diagnosis not present

## 2016-07-31 DIAGNOSIS — S72402A Unspecified fracture of lower end of left femur, initial encounter for closed fracture: Principal | ICD-10-CM | POA: Diagnosis present

## 2016-07-31 DIAGNOSIS — M549 Dorsalgia, unspecified: Secondary | ICD-10-CM | POA: Diagnosis present

## 2016-07-31 DIAGNOSIS — N390 Urinary tract infection, site not specified: Secondary | ICD-10-CM | POA: Diagnosis present

## 2016-07-31 DIAGNOSIS — S72492A Other fracture of lower end of left femur, initial encounter for closed fracture: Secondary | ICD-10-CM

## 2016-07-31 DIAGNOSIS — G4733 Obstructive sleep apnea (adult) (pediatric): Secondary | ICD-10-CM | POA: Diagnosis present

## 2016-07-31 DIAGNOSIS — W1830XA Fall on same level, unspecified, initial encounter: Secondary | ICD-10-CM | POA: Diagnosis present

## 2016-07-31 DIAGNOSIS — Z888 Allergy status to other drugs, medicaments and biological substances status: Secondary | ICD-10-CM | POA: Diagnosis not present

## 2016-07-31 DIAGNOSIS — Z79899 Other long term (current) drug therapy: Secondary | ICD-10-CM | POA: Diagnosis not present

## 2016-07-31 DIAGNOSIS — E1140 Type 2 diabetes mellitus with diabetic neuropathy, unspecified: Secondary | ICD-10-CM | POA: Diagnosis not present

## 2016-07-31 DIAGNOSIS — Z794 Long term (current) use of insulin: Secondary | ICD-10-CM

## 2016-07-31 DIAGNOSIS — Z87442 Personal history of urinary calculi: Secondary | ICD-10-CM | POA: Diagnosis not present

## 2016-07-31 DIAGNOSIS — I110 Hypertensive heart disease with heart failure: Secondary | ICD-10-CM | POA: Diagnosis not present

## 2016-07-31 DIAGNOSIS — M25462 Effusion, left knee: Secondary | ICD-10-CM | POA: Diagnosis not present

## 2016-07-31 DIAGNOSIS — C50911 Malignant neoplasm of unspecified site of right female breast: Secondary | ICD-10-CM | POA: Diagnosis present

## 2016-07-31 DIAGNOSIS — Z79811 Long term (current) use of aromatase inhibitors: Secondary | ICD-10-CM

## 2016-07-31 DIAGNOSIS — Z8249 Family history of ischemic heart disease and other diseases of the circulatory system: Secondary | ICD-10-CM

## 2016-07-31 DIAGNOSIS — I5032 Chronic diastolic (congestive) heart failure: Secondary | ICD-10-CM | POA: Diagnosis present

## 2016-07-31 DIAGNOSIS — R2242 Localized swelling, mass and lump, left lower limb: Secondary | ICD-10-CM | POA: Diagnosis not present

## 2016-07-31 DIAGNOSIS — Z923 Personal history of irradiation: Secondary | ICD-10-CM

## 2016-07-31 DIAGNOSIS — R2689 Other abnormalities of gait and mobility: Secondary | ICD-10-CM | POA: Diagnosis not present

## 2016-07-31 DIAGNOSIS — C50411 Malignant neoplasm of upper-outer quadrant of right female breast: Secondary | ICD-10-CM | POA: Diagnosis not present

## 2016-07-31 DIAGNOSIS — G2581 Restless legs syndrome: Secondary | ICD-10-CM | POA: Diagnosis present

## 2016-07-31 DIAGNOSIS — R41841 Cognitive communication deficit: Secondary | ICD-10-CM | POA: Diagnosis not present

## 2016-07-31 DIAGNOSIS — M9702XA Periprosthetic fracture around internal prosthetic left hip joint, initial encounter: Secondary | ICD-10-CM | POA: Diagnosis not present

## 2016-07-31 DIAGNOSIS — Z882 Allergy status to sulfonamides status: Secondary | ICD-10-CM | POA: Diagnosis not present

## 2016-07-31 DIAGNOSIS — S8990XA Unspecified injury of unspecified lower leg, initial encounter: Secondary | ICD-10-CM | POA: Diagnosis not present

## 2016-07-31 DIAGNOSIS — T84019A Broken internal joint prosthesis, unspecified site, initial encounter: Secondary | ICD-10-CM

## 2016-07-31 DIAGNOSIS — S62609A Fracture of unspecified phalanx of unspecified finger, initial encounter for closed fracture: Secondary | ICD-10-CM | POA: Diagnosis not present

## 2016-07-31 DIAGNOSIS — Z01810 Encounter for preprocedural cardiovascular examination: Secondary | ICD-10-CM

## 2016-07-31 DIAGNOSIS — S79912A Unspecified injury of left hip, initial encounter: Secondary | ICD-10-CM | POA: Diagnosis not present

## 2016-07-31 DIAGNOSIS — I10 Essential (primary) hypertension: Secondary | ICD-10-CM | POA: Diagnosis present

## 2016-07-31 DIAGNOSIS — M479 Spondylosis, unspecified: Secondary | ICD-10-CM | POA: Diagnosis present

## 2016-07-31 DIAGNOSIS — R278 Other lack of coordination: Secondary | ICD-10-CM | POA: Diagnosis not present

## 2016-07-31 DIAGNOSIS — M19019 Primary osteoarthritis, unspecified shoulder: Secondary | ICD-10-CM | POA: Diagnosis present

## 2016-07-31 LAB — CBC WITH DIFFERENTIAL/PLATELET
BASOS ABS: 0 10*3/uL (ref 0.0–0.1)
Basophils Relative: 0 %
EOS PCT: 2 %
Eosinophils Absolute: 0.2 10*3/uL (ref 0.0–0.7)
HCT: 41.6 % (ref 36.0–46.0)
Hemoglobin: 13.9 g/dL (ref 12.0–15.0)
LYMPHS PCT: 11 %
Lymphs Abs: 1.4 10*3/uL (ref 0.7–4.0)
MCH: 30.2 pg (ref 26.0–34.0)
MCHC: 33.4 g/dL (ref 30.0–36.0)
MCV: 90.2 fL (ref 78.0–100.0)
Monocytes Absolute: 0.9 10*3/uL (ref 0.1–1.0)
Monocytes Relative: 8 %
NEUTROS ABS: 9.9 10*3/uL — AB (ref 1.7–7.7)
Neutrophils Relative %: 79 %
PLATELETS: 273 10*3/uL (ref 150–400)
RBC: 4.61 MIL/uL (ref 3.87–5.11)
RDW: 13.7 % (ref 11.5–15.5)
WBC: 12.5 10*3/uL — AB (ref 4.0–10.5)

## 2016-07-31 LAB — BASIC METABOLIC PANEL
ANION GAP: 10 (ref 5–15)
BUN: 9 mg/dL (ref 6–20)
CO2: 26 mmol/L (ref 22–32)
Calcium: 8.8 mg/dL — ABNORMAL LOW (ref 8.9–10.3)
Chloride: 101 mmol/L (ref 101–111)
Creatinine, Ser: 0.51 mg/dL (ref 0.44–1.00)
GFR calc Af Amer: >60 mL/min (ref >60)
GLUCOSE: 180 mg/dL — AB (ref 65–99)
POTASSIUM: 3 mmol/L — AB (ref 3.5–5.1)
Sodium: 137 mmol/L (ref 135–145)

## 2016-07-31 MED ORDER — CARVEDILOL 3.125 MG PO TABS
3.1250 mg | ORAL_TABLET | Freq: Two times a day (BID) | ORAL | Status: DC
  Administered 2016-07-31: 3.125 mg via ORAL
  Filled 2016-07-31 (×2): qty 1

## 2016-07-31 MED ORDER — LISINOPRIL 20 MG PO TABS
20.0000 mg | ORAL_TABLET | Freq: Once | ORAL | Status: AC
  Administered 2016-07-31: 20 mg via ORAL
  Filled 2016-07-31: qty 1

## 2016-07-31 MED ORDER — FUROSEMIDE 20 MG PO TABS
40.0000 mg | ORAL_TABLET | Freq: Once | ORAL | Status: AC
  Administered 2016-07-31: 40 mg via ORAL
  Filled 2016-07-31: qty 2

## 2016-07-31 MED ORDER — FENTANYL CITRATE (PF) 100 MCG/2ML IJ SOLN
50.0000 ug | Freq: Once | INTRAMUSCULAR | Status: AC
  Administered 2016-07-31: 50 ug via INTRAVENOUS
  Filled 2016-07-31: qty 2

## 2016-07-31 NOTE — ED Triage Notes (Signed)
Per EMS pt from home called our for left knee pain after fall. Patient was trying to stand and take a nap and fell forwards. Denies any other injuries. Patient states this happened this morning and was hoping it went away but pain still persists. Some swelling noted- no obvious deformity.Patient able to stand and pivot. Pain with weight-bearing.

## 2016-07-31 NOTE — ED Provider Notes (Addendum)
Medical screening examination/treatment/procedure(s) were conducted as a shared visit with non-physician practitioner(s) and myself.  I personally evaluated the patient during the encounter.  Patient is here because she fell asleep. States that she has multiple episodes of severe restless leg syndrome and pain so she has to walk around a lot. 3 days in row without sleeping so soon as the pain dissipates she stops and closes her eyes and falls asleep. This happened today and she fell. Her left knee hurts. On exam she has a normal regular rate and rhythm shows no murmurs rubs or gallops. Her lungs are clear. She has tenderness over her left middle femur area but but has no difficulty with straight leg raise.  Will ensure no causes for syncope, otherwise will image leg 2/2 pain and dispo appropriately.    EKG Interpretation  Date/Time:  Friday July 31 2016 19:47:37 EDT Ventricular Rate:  93 PR Interval:    QRS Duration: 91 QT Interval:  356 QTC Calculation: 443 R Axis:   -43 Text Interpretation:  Sinus rhythm Ventricular premature complex LVH with secondary repolarization abnormality Inferior infarct, old Anterior infarct, old Baseline wander in lead(s) V3 Confirmed by [facility] MD, Dot Splinter [phone]) on 07/31/2016 8:55:15 PM         Merrily Pew, MD 08/03/16 [phone]

## 2016-07-31 NOTE — ED Notes (Signed)
Pt to xray at this time.

## 2016-07-31 NOTE — ED Notes (Signed)
ED Provider at bedside. 

## 2016-07-31 NOTE — ED Provider Notes (Signed)
[facility] DEPT Provider Note   CSN: [id number] Arrival date & time: 07/31/16  1851     History   Chief Complaint Chief Complaint  Patient presents with  . Fall    HPI [name] is a 76 y.o. female.  HPI   Patient is a 76 year old female with history of hypertension, diabetes, CHF, breast cancer status post radiation who presents to the emergency department with complaints of left knee pain status post fall, onset 7:30 AM. Patient and has been at bedside reports that she has been unable to sleep for the past few days due to chronic back and diffuse body pain. She notes this morning while she was standing she was trying to fall asleep which resulted in her falling and landing on her left knee. Denies head injury or LOC. Patient reports she has continued to have pain to her left knee throughout the day and has been unable to ambulate resulting her coming to the ED. Patient states she uses a cane/walker typically only when going outside of her house. Patient denies any syncopal episode or symptoms prior to fall. She currently denies headache, visual changes, lightheadedness, dizziness, neck/back pain, chest pain, abdominal pain, shortness of breath, palpitations, urinary symptoms, numbness, tingling, weakness. Patient denies taking any of her home medications today. Denies taking any pain meds prior to arrival. Denies use of anticoagulants.  Past Medical History:  Diagnosis Date  . AK (actinic keratosis)   . Anxiety    occasional  . Breast cancer ([place])    R breast, s/p radiation 34Gy/66fx 04/29/10-05/05/10  . CTS (carpal tunnel syndrome)    Left  . Diabetes ([place])   . Diastolic CHF ([place])    Dx w/ hospitalization 06/2015 for respiratory failure  . Falls frequently    "can not pick left leg up"  . Foot fracture   . Hearing loss    bilateral hearing aides  . History of environmental allergies   . History of kidney stones 20 years ago  . Hypertension   . MDD (major depressive  disorder)   . No blood products ([place])  09/11/2013  . Osteoarthritis    hx shoulder, knee, back, wrist pain; saw Dr. Wynelle Link   . RESTLESS LEGS SYNDROME 05/05/2007   Qualifier: Diagnosis of  By: Gwenette Greet MD, Armando Reichert   . RLS (restless legs syndrome)   . Sleep apnea     Patient Active Problem List   Diagnosis Date Noted  . Obesity 02/20/2016  . Chronic pain syndrome 02/19/2016  . Mild episode of recurrent major depressive disorder ([place]) 08/02/2015  . Chronic diastolic congestive heart failure ([place]) 07/16/2015  . Type 2 diabetes mellitus with diabetic neuropathy ([place]) 09/27/2014  . Hearing loss - has hearing aides, follow by audiologist 10/11/2013  . No blood products ([place])  09/11/2013  . Disorder of bone and cartilage 06/10/2010  . Breast cancer of upper-outer quadrant of right female breast ([facility]) 04/04/2010  . RESTLESS LEGS SYNDROME 05/05/2007  . Obstructive sleep apnea 03/01/2007  . Essential hypertension 01/27/2007  . Osteoarthritis 01/27/2007    Past Surgical History:  Procedure Laterality Date  . APPENDECTOMY  age 89  . BREAST SURGERY  2012   rt breast lumpectomy  . flex signoidoscopy    . INCISION AND DRAINAGE PERIRECTAL ABSCESS    . JOINT REPLACEMENT Right 2006   knee  . TOTAL KNEE ARTHROPLASTY Left 08/06/2014   Procedure: LEFT TOTAL KNEE ARTHROPLASTY;  Surgeon: Gaynelle Arabian, MD;  Location: [facility];  Service: Orthopedics;  Laterality: Left;  . TUBAL LIGATION  1979    OB History    No data available       Home Medications    Prior to Admission medications   Medication Sig Start Date End Date Taking? Authorizing Provider  Acetaminophen-Codeine 300-30 MG tablet Take 1 tablet by mouth every 4 (four) hours as needed for pain.   Yes Historical Provider, MD  anastrozole (ARIMIDEX) 1 MG tablet TAKE ONE TABLET BY MOUTH ONCE DAILY 12/03/15  Yes Nicholas Lose, MD  carvedilol (COREG) 3.125 MG tablet Take 1 tablet (3.125 mg total) by mouth 2 (two) times  daily with a meal. 04/06/16  Yes [name], DO  cholecalciferol (VITAMIN D) 1000 units tablet Take 1,000 Units by mouth 2 (two) times daily.   Yes Historical Provider, MD  diphenhydramine-acetaminophen (TYLENOL PM) 25-500 MG TABS Take 1 tablet by mouth at bedtime as needed (for pain /sleep).   Yes Historical Provider, MD  DULoxetine (CYMBALTA) 20 MG capsule Take 1 capsule (20 mg total) by mouth daily. Patient taking differently: Take 20 mg by mouth daily as needed. Mood 05/22/16  Yes [name], DO  HYDROcodone-acetaminophen (NORCO) 7.5-325 MG tablet Take 1 tablet by mouth every 6 (six) hours as needed for moderate pain. 04/02/16  Yes Dorothyann Peng, NP  Insulin Glargine (LANTUS SOLOSTAR) 100 UNIT/ML Solostar Pen Inject 16 Units into the skin daily at 10 pm. 07/14/16  Yes [name], DO  Insulin Pen Needle (BD PEN NEEDLE NANO U/F) 32G X 4 MM MISC Use as directed once a day 05/22/16  Yes [name], DO  lisinopril (PRINIVIL,ZESTRIL) 20 MG tablet Take 1 tablet (20 mg total) by mouth daily. 04/06/16  Yes [name], DO  metFORMIN (GLUCOPHAGE) 1000 MG tablet Take 1 tablet (1,000 mg total) by mouth 2 (two) times daily. 04/06/16  Yes [name], DO  methocarbamol (ROBAXIN) 500 MG tablet Take 500 mg by mouth every 6 (six) hours as needed for muscle spasms. Reported on 09/25/2015   Yes Historical Provider, MD  potassium chloride (K-DUR) 10 MEQ tablet Take 1 tablet (10 mEq total) by mouth daily. 10/22/15  Yes Minus Breeding, MD  potassium chloride SA (K-DUR,KLOR-CON) 20 MEQ tablet Take 20 mEq by mouth daily.   Yes Historical Provider, MD  pramipexole (MIRAPEX) 1 MG tablet TAKE ONE TABLET BY MOUTH TWICE DAILY 02/18/16  Yes [name], DO  Pyridoxine HCl (VITAMIN B-6 PO) Take by mouth.   Yes Historical Provider, MD  furosemide (LASIX) 40 MG tablet Take 1 tablet (40 mg total) by mouth daily. 04/06/16   [name], DO    Family History Family History  Problem Relation Age of Onset  . Alcohol abuse  Father   . Cancer Sister     breast  . Heart disease Paternal Grandfather   . Diabetes Other   . Obesity Other   . Heart disease Brother     Valve    Social History Social History  Substance Use Topics  . Smoking status: Never Smoker  . Smokeless tobacco: Never Used  . Alcohol use No     Allergies   Sulfonamide derivatives and Other   Review of Systems Review of Systems  Musculoskeletal: Positive for arthralgias (left knee).  All other systems reviewed and are negative.    Physical Exam Updated Vital Signs BP (!) 226/111 (BP Location: Left Arm)   Pulse 99   Temp 98 F (36.7 C) (Axillary)  Resp 20   Ht 5\' 1"  (1.549 m)   Wt 87.1 kg   SpO2 92%   BMI 36.28 kg/m   Physical Exam  Constitutional: She is oriented to person, place, and time. She appears well-developed and well-nourished.  Elderly appearing female  HENT:  Head: Normocephalic and atraumatic.  Right Ear: Hearing, tympanic membrane, external ear and ear canal normal. No hemotympanum.  Left Ear: Hearing, tympanic membrane, external ear and ear canal normal. No hemotympanum.  Nose: Nose normal. Right sinus exhibits no maxillary sinus tenderness and no frontal sinus tenderness. Left sinus exhibits no maxillary sinus tenderness and no frontal sinus tenderness.  Mouth/Throat: Uvula is midline, oropharynx is clear and moist and mucous membranes are normal. No oropharyngeal exudate, posterior oropharyngeal edema, posterior oropharyngeal erythema or tonsillar abscesses. No tonsillar exudate.  Eyes: Conjunctivae and EOM are normal. Pupils are equal, round, and reactive to light. Right eye exhibits no discharge. Left eye exhibits no discharge. No scleral icterus.  Neck: Normal range of motion. Neck supple.  Cardiovascular: Normal rate, regular rhythm, normal heart sounds and intact distal pulses.   Pulmonary/Chest: Effort normal and breath sounds normal. No respiratory distress. She has no wheezes. She has no rales.  She exhibits no tenderness.  Abdominal: Soft. Bowel sounds are normal. She exhibits no distension and no mass. There is no tenderness. There is no rebound and no guarding. No hernia.  Musculoskeletal: She exhibits tenderness. She exhibits no edema or deformity.       Left knee: She exhibits decreased range of motion and swelling (mild). She exhibits no effusion, no ecchymosis, no deformity, no laceration, no erythema, normal alignment and normal patellar mobility. Tenderness found.  No midline C, T, or L tenderness. Full range of motion of neck and back. Full range of motion of bilateral upper and right lower extremities, with 5/5 strength. FROM of bilateral hips, no pelvic instability. Dec ROM and TTP over left anterior knee. Dec strength present to left knee due to reported pain. Sensation intact. 2+ radial and DP pulses. Cap refill <2 seconds.  Lymphadenopathy:    She has no cervical adenopathy.  Neurological: She is alert and oriented to person, place, and time. No cranial nerve deficit or sensory deficit. Coordination normal.  Skin: Skin is warm and dry.  Nursing note and vitals reviewed.    ED Treatments / Results  Labs (all labs ordered are listed, but only abnormal results are displayed) Labs Reviewed  CBC WITH DIFFERENTIAL/PLATELET - Abnormal; Notable for the following:       Result Value   WBC 12.5 (*)    Neutro Abs 9.9 (*)    All other components within normal limits  BASIC METABOLIC PANEL - Abnormal; Notable for the following:    Potassium 3.0 (*)    Glucose, Bld 180 (*)    Calcium 8.8 (*)    All other components within normal limits    EKG  EKG Interpretation  Date/Time:  Friday July 31 2016 19:47:37 EDT Ventricular Rate:  93 PR Interval:    QRS Duration: 91 QT Interval:  356 QTC Calculation: 443 R Axis:   -43 Text Interpretation:  Sinus rhythm Ventricular premature complex LVH with secondary repolarization abnormality Inferior infarct, old Anterior infarct, old  Baseline wander in lead(s) V3 Confirmed by [facility] MD, JASON [phone]) on 07/31/2016 8:55:15 PM       Radiology Dg Knee Complete 4 Views Left  Result Date: 07/31/2016 CLINICAL DATA:  76 y/o  F; status post fall with  pain. EXAM: LEFT KNEE - COMPLETE 4+ VIEW COMPARISON:  02/07/2014 knee radiographs. FINDINGS: Posterior distal femur fracture at the bone prosthesis interface. Total knee arthroplasty and patellar resurfacing with prosthesis. Calcific bodies within the joint space. No addition periprosthetic lucency. IMPRESSION: Posterior distal femur fracture at the bone prosthesis interface. Electronically Signed   By: Kristine Garbe M.D.   On: 07/31/2016 21:19   Dg Hip Unilat With Pelvis 2-3 Views Left  Result Date: 07/31/2016 CLINICAL DATA:  Pt states she was standing and fell asleep and fell down this morning at her home on carpet. Pt has a hx of falling and has restless leg syndrome. Best obtainable EXAM: DG HIP (WITH OR WITHOUT PELVIS) 2-3V LEFT COMPARISON:  None. FINDINGS: Hips are located. No evidence of pelvic fracture or sacral fracture. Dedicated view of the LEFT hip demonstrates no femoral neck fracture. There is a ring of osteophytes which marks over the femoral neck. No clear evidence fracture. IMPRESSION: No clear evidence of LEFT femur fracture. Ring of osteophytes extends over the femoral neck. If there is continue clinical suspicion for occult hip fracture or the patient refuses to weightbear, consider further evaluation with MRI or CT. Electronically Signed   By: Suzy Bouchard M.D.   On: 07/31/2016 21:19    Procedures Procedures (including critical care time)  Medications Ordered in ED Medications  carvedilol (COREG) tablet 3.125 mg (3.125 mg Oral Given 07/31/16 2013)  fentaNYL (SUBLIMAZE) injection 50 mcg (not administered)  lisinopril (PRINIVIL,ZESTRIL) tablet 20 mg (20 mg Oral Given 07/31/16 2012)  furosemide (LASIX) tablet 40 mg (40 mg Oral Given 07/31/16 2014)      Initial Impression / Assessment and Plan / ED Course  I have reviewed the triage vital signs and the nursing notes.  Pertinent labs & imaging results that were available during my care of the patient were reviewed by me and considered in my medical decision making (see chart for details).    Pt presents with left knee pain s/p fall due to falling asleep while standing up. Reports being unable to sleep for past few days due to chronic pain. Denies head injury, syncope/LOC. Denies use of anticoagulants. Hx of left knee replacement in 2016. VSS. Exam showed mild swelling, tenderness and dec ROM of left knee due to reported pain. Remaining exam unremarkable. No evidence of head injury, no neuro deficits. Pt given pain meds and xrays ordered. Discussed pt with Dr. Dayna Barker who also evaluated pt in the ED. Plan to order basic labs and EKG for possible syncope workup.  WBC 12.5. K 3.0. Remaining labs unremarkable. EKG showed sinus rhythm with LVH. Left knee xray showed posterior distal femur fx at bone prothesis. Consulted ortho ([place]). Ortho advised to place pt in knee immobilized with bulky dressing and admit. Plan to evaluate pt in the morning. Discussed results and plan for admission with pt.   Final Clinical Impressions(s) / ED Diagnoses   Final diagnoses:  Other closed fracture of distal end of left femur, initial encounter [facility])    New Prescriptions New Prescriptions   No medications on file     Nona Dell, PA-C 07/31/16 2304    Merrily Pew, MD 08/03/16 [phone]

## 2016-08-01 ENCOUNTER — Observation Stay (HOSPITAL_COMMUNITY): Payer: Medicare HMO

## 2016-08-01 ENCOUNTER — Encounter (HOSPITAL_COMMUNITY): Payer: Self-pay | Admitting: Family Medicine

## 2016-08-01 ENCOUNTER — Inpatient Hospital Stay (HOSPITAL_COMMUNITY): Payer: Medicare HMO

## 2016-08-01 DIAGNOSIS — S72492A Other fracture of lower end of left femur, initial encounter for closed fracture: Secondary | ICD-10-CM | POA: Diagnosis not present

## 2016-08-01 DIAGNOSIS — E1140 Type 2 diabetes mellitus with diabetic neuropathy, unspecified: Secondary | ICD-10-CM

## 2016-08-01 DIAGNOSIS — G894 Chronic pain syndrome: Secondary | ICD-10-CM | POA: Diagnosis not present

## 2016-08-01 DIAGNOSIS — W19XXXA Unspecified fall, initial encounter: Secondary | ICD-10-CM

## 2016-08-01 DIAGNOSIS — T84019A Broken internal joint prosthesis, unspecified site, initial encounter: Secondary | ICD-10-CM | POA: Diagnosis not present

## 2016-08-01 DIAGNOSIS — Z01810 Encounter for preprocedural cardiovascular examination: Secondary | ICD-10-CM | POA: Diagnosis not present

## 2016-08-01 DIAGNOSIS — Z794 Long term (current) use of insulin: Secondary | ICD-10-CM

## 2016-08-01 DIAGNOSIS — I5032 Chronic diastolic (congestive) heart failure: Secondary | ICD-10-CM

## 2016-08-01 DIAGNOSIS — I10 Essential (primary) hypertension: Secondary | ICD-10-CM

## 2016-08-01 DIAGNOSIS — R4182 Altered mental status, unspecified: Secondary | ICD-10-CM | POA: Diagnosis not present

## 2016-08-01 LAB — URINALYSIS, ROUTINE W REFLEX MICROSCOPIC
Bilirubin Urine: NEGATIVE
Glucose, UA: NEGATIVE mg/dL
Ketones, ur: NEGATIVE mg/dL
NITRITE: NEGATIVE
PROTEIN: 100 mg/dL — AB
SPECIFIC GRAVITY, URINE: 1.017 (ref 1.005–1.030)
pH: 5 (ref 5.0–8.0)

## 2016-08-01 LAB — PROTIME-INR
INR: 1.12
Prothrombin Time: 14.5 seconds (ref 11.4–15.2)

## 2016-08-01 LAB — BASIC METABOLIC PANEL
ANION GAP: 9 (ref 5–15)
BUN: 14 mg/dL (ref 6–20)
CO2: 28 mmol/L (ref 22–32)
Calcium: 8.8 mg/dL — ABNORMAL LOW (ref 8.9–10.3)
Chloride: 102 mmol/L (ref 101–111)
Creatinine, Ser: 0.69 mg/dL (ref 0.44–1.00)
Glucose, Bld: 167 mg/dL — ABNORMAL HIGH (ref 65–99)
Potassium: 3 mmol/L — ABNORMAL LOW (ref 3.5–5.1)
SODIUM: 139 mmol/L (ref 135–145)

## 2016-08-01 LAB — APTT: APTT: 26 s (ref 24–36)

## 2016-08-01 LAB — BLOOD GAS, ARTERIAL
Acid-Base Excess: 4.7 mmol/L — ABNORMAL HIGH (ref 0.0–2.0)
Bicarbonate: 30.6 mmol/L — ABNORMAL HIGH (ref 20.0–28.0)
Drawn by: 295031
O2 Content: 2 L/min
O2 Saturation: 93.4 %
PATIENT TEMPERATURE: 98.6
PO2 ART: 75.8 mmHg — AB (ref 83.0–108.0)
pCO2 arterial: 53.1 mmHg — ABNORMAL HIGH (ref 32.0–48.0)
pH, Arterial: 7.379 (ref 7.350–7.450)

## 2016-08-01 LAB — CBC
HEMATOCRIT: 39.3 % (ref 36.0–46.0)
HEMOGLOBIN: 12.8 g/dL (ref 12.0–15.0)
MCH: 29.1 pg (ref 26.0–34.0)
MCHC: 32.6 g/dL (ref 30.0–36.0)
MCV: 89.3 fL (ref 78.0–100.0)
Platelets: 281 10*3/uL (ref 150–400)
RBC: 4.4 MIL/uL (ref 3.87–5.11)
RDW: 13.5 % (ref 11.5–15.5)
WBC: 12.7 10*3/uL — AB (ref 4.0–10.5)

## 2016-08-01 LAB — GLUCOSE, CAPILLARY
GLUCOSE-CAPILLARY: 151 mg/dL — AB (ref 65–99)
GLUCOSE-CAPILLARY: 175 mg/dL — AB (ref 65–99)
Glucose-Capillary: 177 mg/dL — ABNORMAL HIGH (ref 65–99)

## 2016-08-01 LAB — TSH: TSH: 1.993 u[IU]/mL (ref 0.350–4.500)

## 2016-08-01 LAB — AMMONIA: AMMONIA: 16 umol/L (ref 9–35)

## 2016-08-01 LAB — MAGNESIUM: MAGNESIUM: 1.7 mg/dL (ref 1.7–2.4)

## 2016-08-01 MED ORDER — ACETAMINOPHEN 650 MG RE SUPP: 650.0000 mg | Freq: Four times a day (QID) | RECTAL | Status: DC | PRN

## 2016-08-01 MED ORDER — PRAMIPEXOLE DIHYDROCHLORIDE 0.25 MG PO TABS
1.0000 mg | ORAL_TABLET | Freq: Two times a day (BID) | ORAL | Status: DC
  Administered 2016-08-01 – 2016-08-03 (×6): 1 mg via ORAL
  Filled 2016-08-01 (×6): qty 4

## 2016-08-01 MED ORDER — METHOCARBAMOL 500 MG PO TABS
500.0000 mg | ORAL_TABLET | Freq: Four times a day (QID) | ORAL | Status: DC | PRN
  Administered 2016-08-01: 500 mg via ORAL
  Filled 2016-08-01: qty 1

## 2016-08-01 MED ORDER — ONDANSETRON HCL 4 MG/2ML IJ SOLN: 4.0000 mg | Freq: Four times a day (QID) | INTRAMUSCULAR | Status: DC | PRN

## 2016-08-01 MED ORDER — OXYCODONE HCL 5 MG PO TABS
5.0000 mg | ORAL_TABLET | ORAL | Status: DC | PRN
  Administered 2016-08-01 (×2): 5 mg via ORAL
  Filled 2016-08-01 (×2): qty 1

## 2016-08-01 MED ORDER — SODIUM CHLORIDE 0.9 % IV SOLN: INTRAVENOUS | Status: DC

## 2016-08-01 MED ORDER — INSULIN ASPART 100 UNIT/ML ~~LOC~~ SOLN
0.0000 [IU] | Freq: Three times a day (TID) | SUBCUTANEOUS | Status: DC
  Administered 2016-08-01 – 2016-08-02 (×5): 3 [IU] via SUBCUTANEOUS
  Administered 2016-08-03: 5 [IU] via SUBCUTANEOUS
  Administered 2016-08-03: 3 [IU] via SUBCUTANEOUS
  Administered 2016-08-04: 5 [IU] via SUBCUTANEOUS
  Administered 2016-08-04: 3 [IU] via SUBCUTANEOUS

## 2016-08-01 MED ORDER — DULOXETINE HCL 20 MG PO CPEP
20.0000 mg | ORAL_CAPSULE | Freq: Every day | ORAL | Status: DC
Start: 2016-08-01 — End: 2016-08-04
  Administered 2016-08-01 – 2016-08-04 (×4): 20 mg via ORAL
  Filled 2016-08-01 (×4): qty 1

## 2016-08-01 MED ORDER — POTASSIUM CHLORIDE CRYS ER 20 MEQ PO TBCR
40.0000 meq | EXTENDED_RELEASE_TABLET | Freq: Two times a day (BID) | ORAL | Status: AC
  Administered 2016-08-01 – 2016-08-02 (×3): 40 meq via ORAL
  Filled 2016-08-01 (×3): qty 2

## 2016-08-01 MED ORDER — ANASTROZOLE 1 MG PO TABS
1.0000 mg | ORAL_TABLET | Freq: Every day | ORAL | Status: DC
Start: 2016-08-01 — End: 2016-08-04
  Administered 2016-08-01 – 2016-08-04 (×4): 1 mg via ORAL
  Filled 2016-08-01 (×5): qty 1

## 2016-08-01 MED ORDER — METHOCARBAMOL 500 MG PO TABS
500.0000 mg | ORAL_TABLET | Freq: Three times a day (TID) | ORAL | Status: DC | PRN
Start: 2016-08-01 — End: 2016-08-04
  Administered 2016-08-02: 500 mg via ORAL
  Filled 2016-08-01: qty 1

## 2016-08-01 MED ORDER — INSULIN ASPART 100 UNIT/ML ~~LOC~~ SOLN: 0.0000 [IU] | Freq: Every day | SUBCUTANEOUS | Status: DC

## 2016-08-01 MED ORDER — CARVEDILOL 12.5 MG PO TABS
6.2500 mg | ORAL_TABLET | Freq: Two times a day (BID) | ORAL | Status: DC
  Administered 2016-08-01 – 2016-08-04 (×7): 6.25 mg via ORAL
  Filled 2016-08-01 (×7): qty 1

## 2016-08-01 MED ORDER — POLYETHYLENE GLYCOL 3350 17 G PO PACK: 17.0000 g | PACK | Freq: Every day | ORAL | Status: DC | PRN

## 2016-08-01 MED ORDER — LISINOPRIL 20 MG PO TABS
20.0000 mg | ORAL_TABLET | Freq: Every day | ORAL | Status: DC
  Administered 2016-08-01 – 2016-08-04 (×4): 20 mg via ORAL
  Filled 2016-08-01 (×4): qty 1

## 2016-08-01 MED ORDER — HYDRALAZINE HCL 20 MG/ML IJ SOLN
5.0000 mg | Freq: Four times a day (QID) | INTRAMUSCULAR | Status: DC | PRN
  Administered 2016-08-01 – 2016-08-03 (×2): 5 mg via INTRAVENOUS
  Filled 2016-08-01: qty 1

## 2016-08-01 MED ORDER — CARVEDILOL 3.125 MG PO TABS: 3.1250 mg | ORAL_TABLET | Freq: Two times a day (BID) | ORAL | Status: DC

## 2016-08-01 MED ORDER — ONDANSETRON HCL 4 MG PO TABS: 4.0000 mg | ORAL_TABLET | Freq: Four times a day (QID) | ORAL | Status: DC | PRN

## 2016-08-01 MED ORDER — HYDRALAZINE HCL 20 MG/ML IJ SOLN
INTRAMUSCULAR | Status: AC
  Filled 2016-08-01: qty 1

## 2016-08-01 MED ORDER — FUROSEMIDE 40 MG PO TABS
40.0000 mg | ORAL_TABLET | Freq: Every day | ORAL | Status: DC
  Administered 2016-08-01: 40 mg via ORAL
  Filled 2016-08-01: qty 1

## 2016-08-01 MED ORDER — ACETAMINOPHEN 325 MG PO TABS
650.0000 mg | ORAL_TABLET | Freq: Four times a day (QID) | ORAL | Status: DC | PRN
  Administered 2016-08-02 – 2016-08-03 (×2): 650 mg via ORAL
  Filled 2016-08-01 (×2): qty 2

## 2016-08-01 MED ORDER — INSULIN GLARGINE 100 UNIT/ML ~~LOC~~ SOLN
16.0000 [IU] | Freq: Every day | SUBCUTANEOUS | Status: DC
  Filled 2016-08-01: qty 0.16

## 2016-08-01 MED ORDER — OXYCODONE HCL 5 MG PO TABS
5.0000 mg | ORAL_TABLET | Freq: Four times a day (QID) | ORAL | Status: DC | PRN
  Administered 2016-08-03: 5 mg via ORAL
  Filled 2016-08-01 (×2): qty 1

## 2016-08-01 NOTE — Progress Notes (Signed)
Orthopedic Tech Progress Note Patient Details:  [name] 06/30/1940 [id number]  Ortho Devices Type of Ortho Device: Knee Immobilizer Ortho Device/Splint Location: lle Ortho Device/Splint Interventions: Ordered, Application As per drs order I applied a bulky dressing over the knee immobilizer. I double checked with the dr and this is what they wanted.  Karolee Stamps 08/01/2016, 1:08 AM

## 2016-08-01 NOTE — Clinical Social Work Placement (Signed)
   CLINICAL SOCIAL WORK PLACEMENT  NOTE  Date:  08/01/2016  Patient Details  Name: [name] MRN: [id number] Date of Birth: 01-29-1941  Clinical Social Work is seeking post-discharge placement for this patient at the [place] level of care (*CSW will initial, date and re-position this form in  chart as items are completed):  Yes   Patient/family provided with [place] Work Department's list of facilities offering this level of care within the geographic area requested by the patient (or if unable, by the patient's family).  Yes   Patient/family informed of their freedom to choose among providers that offer the needed level of care, that participate in Medicare, Medicaid or managed care program needed by the patient, have an available bed and are willing to accept the patient.  Yes   Patient/family informed of [facility]'s ownership interest in [facility] and [facility], as well as of the fact that they are under no obligation to receive care at these facilities.  PASRR submitted to EDS on 08/01/16     PASRR number received on       Existing PASRR number confirmed on       FL2 transmitted to all facilities in geographic area requested by pt/family on 08/01/16     FL2 transmitted to all facilities within larger geographic area on       Patient informed that his/her managed care company has contracts with or will negotiate with certain facilities, including the following:   (Has [facility] Medicare- will require prior authorization for placement vs 5 day LOG until auth can be obtained.)         Patient/family informed of bed offers received.  Patient chooses bed at       Physician recommends and patient chooses bed at      Patient to be transferred to   on  .  Patient to be transferred to facility by [facility])     Patient family notified on   of transfer.  Name of family member notified:        PHYSICIAN Please prepare  priority discharge summary, including medications, Please prepare prescriptions, Please sign FL2     Additional Comment:    _______________________________________________ Williemae Area, LCSW 08/01/2016, 4:15 PM

## 2016-08-01 NOTE — ED Notes (Signed)
Family updated on PLAN of CARE and understands plan.

## 2016-08-01 NOTE — Progress Notes (Signed)
PT Cancellation Note  Patient Details Name: [name] MRN: [id number] DOB: 1940-08-21   Cancelled Treatment:     PT order received and eval attempted x 2 but deferred this date - pt very somnolent.  Will re-attempt tomorrow.   Mayana Irigoyen 08/01/2016, 3:17 PM

## 2016-08-01 NOTE — Consult Note (Addendum)
Reason for Consult:  Left knee pain Referring Physician:  Dr. Gregary Signs is an 76 y.o. female.  HPI:  76 y/o female with h/o chronic pain fell yesterday at home when she fell asleep while standing up.  She has recently had difficulty sleeping due to chronic pain.  She reports falling onto her L knee.  She had the knee replaced by Dr. Maureen Ralphs in 2016.  She c/o aching pain in the left knee that is worse with any WB and better with rest and elevation.  Past Medical History:  Diagnosis Date  . AK (actinic keratosis)   . Anxiety    occasional  . Breast cancer ([place])    R breast, s/p radiation 34Gy/36f 04/29/10-05/05/10  . CTS (carpal tunnel syndrome)    Left  . Diabetes ([place]   . Diastolic CHF ([place]    Dx w/ hospitalization 06/2015 for respiratory failure  . Falls frequently    "can not pick left leg up"  . Foot fracture   . Hearing loss    bilateral hearing aides  . History of environmental allergies   . History of kidney stones 20 years ago  . Hypertension   . MDD (major depressive disorder)   . No blood products ([place])  09/11/2013  . Osteoarthritis    hx shoulder, knee, back, wrist pain; saw Dr. AWynelle Link  . RESTLESS LEGS SYNDROME 05/05/2007   Qualifier: Diagnosis of  By: CGwenette GreetMD, KArmando Reichert  . RLS (restless legs syndrome)   . Sleep apnea     Past Surgical History:  Procedure Laterality Date  . APPENDECTOMY  age 76 . BREAST SURGERY  2012   rt breast lumpectomy  . flex signoidoscopy    . INCISION AND DRAINAGE PERIRECTAL ABSCESS    . JOINT REPLACEMENT Right 2006   knee  . TOTAL KNEE ARTHROPLASTY Left 08/06/2014   Procedure: LEFT TOTAL KNEE ARTHROPLASTY;  Surgeon: FGaynelle Arabian MD;  Location: [facility];  Service: Orthopedics;  Laterality: Left;  . TUBAL LIGATION  1979    Family History  Problem Relation Age of Onset  . Alcohol abuse Father   . Cancer Sister     breast  . Heart disease Paternal Grandfather   . Diabetes Other   . Obesity Other   .  Heart disease Brother     Valve    Social History:  reports that she has never smoked. She has never used smokeless tobacco. She reports that she does not drink alcohol or use drugs.  Allergies:  Allergies  Allergen Reactions  . Sulfonamide Derivatives Swelling    Lips Swelling  . Other     BLOOD PRODUCT REFUSAL    Medications: I have reviewed the patient's current medications.  Results for orders placed or performed during the hospital encounter of 07/31/16 (from the past 48 hour(s))  CBC with Differential     Status: Abnormal   Collection Time: 07/31/16  7:32 PM  Result Value Ref Range   WBC 12.5 (H) 4.0 - 10.5 K/uL   RBC 4.61 3.87 - 5.11 MIL/uL   Hemoglobin 13.9 12.0 - 15.0 g/dL   HCT 41.6 36.0 - 46.0 %   MCV 90.2 78.0 - 100.0 fL   MCH 30.2 26.0 - 34.0 pg   MCHC 33.4 30.0 - 36.0 g/dL   RDW 13.7 11.5 - 15.5 %   Platelets 273 150 - 400 K/uL   Neutrophils Relative % 79 %   Neutro Abs 9.9 (  H) 1.7 - 7.7 K/uL   Lymphocytes Relative 11 %   Lymphs Abs 1.4 0.7 - 4.0 K/uL   Monocytes Relative 8 %   Monocytes Absolute 0.9 0.1 - 1.0 K/uL   Eosinophils Relative 2 %   Eosinophils Absolute 0.2 0.0 - 0.7 K/uL   Basophils Relative 0 %   Basophils Absolute 0.0 0.0 - 0.1 K/uL  Basic metabolic panel     Status: Abnormal   Collection Time: 07/31/16  7:32 PM  Result Value Ref Range   Sodium 137 135 - 145 mmol/L   Potassium 3.0 (L) 3.5 - 5.1 mmol/L   Chloride 101 101 - 111 mmol/L   CO2 26 22 - 32 mmol/L   Glucose, Bld 180 (H) 65 - 99 mg/dL   BUN 9 6 - 20 mg/dL   Creatinine, Ser 0.51 0.44 - 1.00 mg/dL   Calcium 8.8 (L) 8.9 - 10.3 mg/dL   GFR calc non Af Amer >60 >60 mL/min   GFR calc Af Amer >60 >60 mL/min    Comment: (NOTE) The eGFR has been calculated using the CKD EPI equation. This calculation has not been validated in all clinical situations. eGFR's persistently <60 mL/min signify possible Chronic Kidney Disease.    Anion gap 10 5 - 15    Ct Knee Left Wo  Contrast  Result Date: 08/01/2016 CLINICAL DATA:  76 year old female with left knee arthroplasty. Evaluate for fracture. EXAM: CT OF THE left KNEE WITHOUT CONTRAST TECHNIQUE: Multidetector CT imaging of the left knee was performed according to the standard protocol. Multiplanar CT image reconstructions were also generated. COMPARISON:  Left knee radiograph dated 07/31/2016 FINDINGS: Evaluation of this exam is very limited due to streak artifact caused by metallic knee arthroplasty. Bones/Joint/Cartilage There is a total left knee arthroplasty. The arthroplasty components appear intact and in anatomic alignment. No evidence of hardware loosening noted. There is faint linear lucency in the medial aspect of the proximal tibial ( series 7, image 53) which may be artifactual or represent a nondisplaced fracture. No the a other acute fracture identified. There is heterotopic bone formation of the posterior femoral metaphysis at the arthroplasty interface. There is marrow heterogeneity of the distal femur. There is no dislocation. There is moderate amount of suprapatellar effusion. Correlation with clinical exam is recommended to evaluate for possibility of infection. Ligaments Suboptimally assessed by CT. Muscles and Tendons No intramuscular hematoma.  No definite acute tendon injury. Soft tissues There is mild subcutaneous soft tissue edema of the anterior knee. IMPRESSION: Very limited evaluation for fracture due to advanced osteopenia and streak artifact caused by metallic knee arthroplasty. There is an area of cortical irregularity versus less likely nondisplaced cortical fracture involving the medial tibial metaphysis. No other acute fracture identified. There is. Ectopic bone formation posterior to the distal femoral metaphysis at the interface with the arthroplasty. Large suprapatellar effusion with mild edema of the subcutaneous soft tissues of the anterior knee. Correlation with clinical exam is recommended to  evaluate for possible infection. Electronically Signed   By: Anner Crete M.D.   On: 08/01/2016 00:08   Dg Knee Complete 4 Views Left  Result Date: 07/31/2016 CLINICAL DATA:  76 y/o  F; status post fall with pain. EXAM: LEFT KNEE - COMPLETE 4+ VIEW COMPARISON:  02/07/2014 knee radiographs. FINDINGS: Posterior distal femur fracture at the bone prosthesis interface. Total knee arthroplasty and patellar resurfacing with prosthesis. Calcific bodies within the joint space. No addition periprosthetic lucency. IMPRESSION: Posterior distal femur fracture at the  bone prosthesis interface. Electronically Signed   By: Kristine Garbe M.D.   On: 07/31/2016 21:19   Dg Hip Unilat With Pelvis 2-3 Views Left  Result Date: 07/31/2016 CLINICAL DATA:  Pt states she was standing and fell asleep and fell down this morning at her home on carpet. Pt has a hx of falling and has restless leg syndrome. Best obtainable EXAM: DG HIP (WITH OR WITHOUT PELVIS) 2-3V LEFT COMPARISON:  None. FINDINGS: Hips are located. No evidence of pelvic fracture or sacral fracture. Dedicated view of the LEFT hip demonstrates no femoral neck fracture. There is a ring of osteophytes which marks over the femoral neck. No clear evidence fracture. IMPRESSION: No clear evidence of LEFT femur fracture. Ring of osteophytes extends over the femoral neck. If there is continue clinical suspicion for occult hip fracture or the patient refuses to weightbear, consider further evaluation with MRI or CT. Electronically Signed   By: Suzy Bouchard M.D.   On: 07/31/2016 21:19    ROS:  + pain.  No recent f/c/n/v/wt loss PE:  Blood pressure (!) 142/69, pulse 93, temperature 98 F (36.7 C), temperature source Axillary, resp. rate (!) 21, height _0  (1.549 m), weight 87.1 kg (192 lb), SpO2 94 %. wn wd elderly woman in nad.  A and O x 4.  Mood and affect normal.  EOMI.  HOH.  L knee with swelling.  Skin intact.  No lymphadenopathy.  5/5 strength in PF  and DF of the ankle.  Sens to LT intact at the dorsal and plantar foot.  1+ DP pulse.  Assessment/Plan: L distal femur periprosthetic fracture - based on the patient's exam and review of the CT and xrays this appears to be a stable fracture that can be managed in closed fashion.  She should be NWB on the L LE with a knee immobilizer.  I'll write for PT and OT.  She can eat this morning. No surgical indication at this time.  Plan d/c home when cleared by PT.  Ortho will follow.  Wylene Simmer 08/01/2016, 7:33 AM

## 2016-08-01 NOTE — ED Notes (Signed)
Bobby at [facility] updated and care[facility] called for transport.

## 2016-08-01 NOTE — Care Management Obs Status (Signed)
[place] NOTIFICATION   Patient Details  Name: [name] MRN: [id number] Date of Birth: 10-30-1940   Medicare Observation Status Notification Given:  Yes    Erenest Rasher, RN 08/01/2016, 5:31 PM

## 2016-08-01 NOTE — ED Notes (Signed)
Pt and family made aware of bed assignment 

## 2016-08-01 NOTE — ED Notes (Signed)
Attempting to improve BP prior to transport.

## 2016-08-01 NOTE — Care Management CC44 (Signed)
Condition Code 44 Documentation Completed  Patient Details  Name: LYNETT BRASIL MRN: [id number] Date of Birth: 22-Jan-1941   Condition Code 44 given:  Yes Patient signature on Condition Code 44 notice:  Yes Documentation of 2 MD's agreement:  Yes Code 44 added to claim:  Yes    Erenest Rasher, RN 08/01/2016, 5:32 PM

## 2016-08-01 NOTE — Clinical Social Work Note (Addendum)
Clinical Social Work Assessment  Patient Details  Name: [name] MRN: [id number] Date of Birth: May 01, 1941  Date of referral:  08/01/16               Reason for consult:  Facility Placement                Permission sought to share information with:  Customer service manager, Family Supports Permission granted to share information::  Yes, Verbal Permission Granted Husband: C: [phone]    Patient's C:  [phone]     Housing/Transportation Living arrangements for the past 2 months:  [place] of Information:  Spouse, Adult Children (Son Arbell Wycoff (c) [phone]) Patient Interpreter Needed:  None Criminal Activity/Legal Involvement Pertinent to Current Situation/Hospitalization:  No - Comment as needed Significant Relationships:  Spouse, Adult Children Lives with:  Spouse Do you feel safe going back to the place where you live?  No (Husband does not feel he can saftely manage patient at this time due to NBW L leg) Need for family participation in patient care:  Yes (Comment) (Pt very groggy and sleepy- poorly responsive today.)  Care giving concerns: Lives at home with husband and is now non-weight bearing on L leg. Husband reports she frequently falls asleep while walking or other activities with high risk for falls. Cannot manage her at this time.    Social Worker assessment / plan:  CSW contacted to assist this patient and family with short term SNF placement. Patient has a non- surgical fractured femur and it was thought that she would be stable to leave the hospital today- however she is very lethargic and will only wake up for short periods of time. MD wants to complete CT and lab work to determine possible causes.  Discussed short term SNF as husband states he cannot manage her at home alone. They have one son but he cannot provide in-home care. Other children live out of state.  Active bed search process initiated; Fl2 completed and referral sent to  area SNF's.  PASARR completed and currently in Manual Review due to mental health diagnosis.  If PASARR number can be obtained by tomorrow and tests are cleared- patient can be d/c'd to a SNF tomorrow with LOG approved for 5 days.   Employment status:  Retired, Disabled (Comment on whether or not currently receiving Disability) Insurance information:  Managed Medicare PT Recommendations:  Not assessed at this time (OT attempted to see but patient too lethargic) Information / Referral to community resources:  [place]  Patient/Family's Response to care:  Husband and son state that they would prefer she stay in hospital or rehab as they cannot manage her care at home.  Are pleased that MD is going to check on reasons why she is so sleepy. Patient would not awaken long enough to assess.  Patient/Family's Understanding of and Emotional Response to Diagnosis, Current Treatment, and Prognosis:  Son and husband are very open to information and discussion of her after hospital needs. They are wanting to know as much as possible about her condition and medical needs and are appreciative of information provided.    Emotional Assessment Appearance:  Appears stated age Attitude/Demeanor/Rapport:  Lethargic (Difficult to access- pt would not stay awake long enough to eval.) Affect (typically observed):   (Unable to assess; patient very groggy and lethargic) Orientation:  Oriented to Self, Oriented to Place, Oriented to  Time, Oriented to Situation (Oriented X4 baseline but  not oriented all spheres today. Very groggy and poorly responsive.) Alcohol / Substance use:  Never Used Psych involvement (Current and /or in the community):  No (Comment) (Denies but has diagnosis of Major Depressive Disorder and Anxiety- on medication. )  Discharge Needs  Concerns to be addressed:  Care Coordination Readmission within the last 30 days:  No Current discharge risk:  Dependent with Mobility (NWB left leg-  brace to leg  ) Barriers to Discharge:  Continued Medical Work up, Ship broker, Other (Needs PASARR number)   Williemae Area, LCSW 08/01/2016, 4:07 PM

## 2016-08-01 NOTE — Progress Notes (Signed)
OT Cancellation Note  Patient Details Name: [name] MRN: [id number] DOB: Jun 03, 1940   Cancelled Treatment:    Reason Eval/Treat Not Completed: Fatigue/lethargy limiting ability to participate. Will follow.  Malka So 08/01/2016, 3:51 PM  [phone]

## 2016-08-01 NOTE — ED Notes (Signed)
Ortho tech at bedside 

## 2016-08-01 NOTE — Progress Notes (Signed)
PROGRESS NOTE    [name]  TGY:[id number] DOB: 05/13/1941 DOA: 07/31/2016 PCP: [name]., DO   Brief Narrative:  [name] is a 76 y.o. female with a past medical history significant for IDDM, HTN, BrCa on Arimidex who presents with fall.  Caveat that patient is sedated from IV opioid and unable to provider her own history.  Per report, she has been unable to sleep for the past few days due to chronic back and diffuse body pain. She notes this morning while she was standing she was trying to fall asleep which resulted in her falling and landing on her left knee. Denies head injury or LOC. Patient reports she has continued to have pain to her left knee throughout the day and has been unable to ambulate resulting her coming to the ED. Patient states she uses a cane/walkertypically only when going outside of her house. Patient denies any syncopal episode or symptoms prior to fall. She currently denies headache, visual changes, lightheadedness, dizziness, neck/back pain, chest pain, abdominal pain, shortness of breath, palpitations, urinary symptoms, numbness, tingling, weakness. Patient denies taking any of her home medications today. Denies taking any pain meds prior to arrival. Denies use of anticoagulants.    Assessment & Plan:   Principal Problem:   Fracture of knee prosthesis (HCC) Active Problems:   Obstructive sleep apnea   Essential hypertension   Type 2 diabetes mellitus with diabetic neuropathy (HCC)   Chronic diastolic congestive heart failure (HCC)   Chronic pain syndrome     Fracture of knee prosthesis:  Overall she has a low perioperative cardiac risk (RCRI 1, Gupta score 0.77%).   -Per Orthopedics No plan fior surgery.  Need knee immobilizer.  PT NWB on the L LE with a knee immobilizer  AMS;  Patient very sleepy this morning.  Patient sleep a lot at home after she has period of worsening back pain./  Will check CT head, ABG, TSH, B12.  Hold lantus  she has not been eating.  CPAP at HS.  Careful with sedatives.    Back pain: This is evidently from DDD (per family, they report seeing someone from [facility] orth[facility] recently, MRI results are not available to me).  Likely this will be a more significant source of discomfort for the patient than her knee. Follow with Dr Nelva Bush.  Will Ask Orhto to evaluate and reviewed MRI.   Hypertension:  -Continue home carvedilol dose increased to 6.25 BID -Continue lisinopril -Hydralazine IV PRN for severe range pressures  Insulin dependent diabetes:  -hold lantus, poor oral intake.  -SSI with meals -Check HgbA1c  Hypokalemia:  -Replace K replace mg  Chronic pain:  -Continue Robaxin, careful with sedation , new Cymbalta -Acetaminophen  Other medications: -Continue anastrozole -Continue pramipexole      DVT prophylaxis: lovenox  Code Status: Full code.  Family Communication: son and husband  Disposition Plan: will benefit from rehab.  Consultants:   Dr Jhonnie Garner.    Procedures  none   Antimicrobials: none   Subjective: Patient very lethargic this morning, didn't wake up for me.  She has not been able to sleep for last 3 days per husband.   I came back this afternoon to check on patient, she wake up more. But still falls back to sleep.  She has not been able to eat.     Objective: Vitals:   08/01/16 0300 08/01/16 0330 08/01/16 0345 08/01/16 0500  BP: (!) 157/79 (!) 142/69    Pulse: 96  93 94  Resp: (!) 22  (!) 21 20  Temp:    99 F (37.2 C)  TempSrc:    Oral  SpO2: 96%  94%   Weight:      Height:        Intake/Output Summary (Last 24 hours) at 08/01/16 1346 Last data filed at 08/01/16 1030  Gross per 24 hour  Intake              270 ml  Output                0 ml  Net              270 ml   Filed Weights   07/31/16 1903  Weight: 87.1 kg (192 lb)    Examination:  General exam: Appears calm and comfortable  Respiratory system: Clear to  auscultation. Respiratory effort normal. Cardiovascular system: S1 & S2 heard, RRR. No JVD, murmurs, rubs, gallops or clicks. No pedal edema. Gastrointestinal system: Abdomen is nondistended, soft and nontender. No organomegaly or masses felt. Normal bowel sounds heard. Central nervous system: Alert and oriented. No focal neurological deficits. Extremities: Symmetric 5 x 5 power. Skin: No rashes, lesions or ulcers Psychiatry: Judgement and insight appear normal. Mood & affect appropriate.     Data Reviewed: I have personally reviewed following labs and imaging studies  CBC:  Recent Labs Lab 07/31/16 1932 08/01/16 0800  WBC 12.5* 12.7*  NEUTROABS 9.9*  --   HGB 13.9 12.8  HCT 41.6 39.3  MCV 90.2 89.3  PLT 273 562   Basic Metabolic Panel:  Recent Labs Lab 07/31/16 1932 08/01/16 0800  NA 137 139  K 3.0* 3.0*  CL 101 102  CO2 26 28  GLUCOSE 180* 167*  BUN 9 14  CREATININE 0.51 0.69  CALCIUM 8.8* 8.8*  MG  --  1.7   GFR: Estimated Creatinine Clearance: 59 mL/min (by C-G formula based on SCr of 0.69 mg/dL). Liver Function Tests: No results for input(s): AST, ALT, ALKPHOS, BILITOT, PROT, ALBUMIN in the last 168 hours. No results for input(s): LIPASE, AMYLASE in the last 168 hours. No results for input(s): AMMONIA in the last 168 hours. Coagulation Profile:  Recent Labs Lab 08/01/16 0800  INR 1.12   Cardiac Enzymes: No results for input(s): CKTOTAL, CKMB, CKMBINDEX, TROPONINI in the last 168 hours. BNP (last 3 results) No results for input(s): PROBNP in the last 8760 hours. HbA1C: No results for input(s): HGBA1C in the last 72 hours. CBG:  Recent Labs Lab 08/01/16 1026  GLUCAP 151*   Lipid Profile: No results for input(s): CHOL, HDL, LDLCALC, TRIG, CHOLHDL, LDLDIRECT in the last 72 hours. Thyroid Function Tests: No results for input(s): TSH, T4TOTAL, FREET4, T3FREE, THYROIDAB in the last 72 hours. Anemia Panel: No results for input(s): VITAMINB12,  FOLATE, FERRITIN, TIBC, IRON, RETICCTPCT in the last 72 hours. Sepsis Labs: No results for input(s): PROCALCITON, LATICACIDVEN in the last 168 hours.  No results found for this or any previous visit (from the past 240 hour(s)).       Radiology Studies: Ct Knee Left Wo Contrast  Result Date: 08/01/2016 CLINICAL DATA:  76 year old female with left knee arthroplasty. Evaluate for fracture. EXAM: CT OF THE left KNEE WITHOUT CONTRAST TECHNIQUE: Multidetector CT imaging of the left knee was performed according to the standard protocol. Multiplanar CT image reconstructions were also generated. COMPARISON:  Left knee radiograph dated 07/31/2016 FINDINGS: Evaluation of this exam is very limited due to streak artifact  caused by metallic knee arthroplasty. Bones/Joint/Cartilage There is a total left knee arthroplasty. The arthroplasty components appear intact and in anatomic alignment. No evidence of hardware loosening noted. There is faint linear lucency in the medial aspect of the proximal tibial ( series 7, image 53) which may be artifactual or represent a nondisplaced fracture. No the a other acute fracture identified. There is heterotopic bone formation of the posterior femoral metaphysis at the arthroplasty interface. There is marrow heterogeneity of the distal femur. There is no dislocation. There is moderate amount of suprapatellar effusion. Correlation with clinical exam is recommended to evaluate for possibility of infection. Ligaments Suboptimally assessed by CT. Muscles and Tendons No intramuscular hematoma.  No definite acute tendon injury. Soft tissues There is mild subcutaneous soft tissue edema of the anterior knee. IMPRESSION: Very limited evaluation for fracture due to advanced osteopenia and streak artifact caused by metallic knee arthroplasty. There is an area of cortical irregularity versus less likely nondisplaced cortical fracture involving the medial tibial metaphysis. No other acute  fracture identified. There is. Ectopic bone formation posterior to the distal femoral metaphysis at the interface with the arthroplasty. Large suprapatellar effusion with mild edema of the subcutaneous soft tissues of the anterior knee. Correlation with clinical exam is recommended to evaluate for possible infection. Electronically Signed   By: Anner Crete M.D.   On: 08/01/2016 00:08   Portable Chest 1 View  Result Date: 08/01/2016 CLINICAL DATA:  Preoperative study.  Right breast cancer. EXAM: PORTABLE CHEST 1 VIEW COMPARISON:  July 02, 2015 FINDINGS: No pneumothorax. Elevated left hemidiaphragm with atelectasis, unchanged. Unchanged cardiomediastinal silhouette. No overt edema. IMPRESSION: No interval change or acute abnormality. Elevated left hemidiaphragm with atelectasis, stable in the interval. Electronically Signed   By: Dorise Bullion III M.D   On: 08/01/2016 08:03   Dg Knee Complete 4 Views Left  Result Date: 07/31/2016 CLINICAL DATA:  76 y/o  F; status post fall with pain. EXAM: LEFT KNEE - COMPLETE 4+ VIEW COMPARISON:  02/07/2014 knee radiographs. FINDINGS: Posterior distal femur fracture at the bone prosthesis interface. Total knee arthroplasty and patellar resurfacing with prosthesis. Calcific bodies within the joint space. No addition periprosthetic lucency. IMPRESSION: Posterior distal femur fracture at the bone prosthesis interface. Electronically Signed   By: Kristine Garbe M.D.   On: 07/31/2016 21:19   Dg Hip Unilat With Pelvis 2-3 Views Left  Result Date: 07/31/2016 CLINICAL DATA:  Pt states she was standing and fell asleep and fell down this morning at her home on carpet. Pt has a hx of falling and has restless leg syndrome. Best obtainable EXAM: DG HIP (WITH OR WITHOUT PELVIS) 2-3V LEFT COMPARISON:  None. FINDINGS: Hips are located. No evidence of pelvic fracture or sacral fracture. Dedicated view of the LEFT hip demonstrates no femoral neck fracture. There is a  ring of osteophytes which marks over the femoral neck. No clear evidence fracture. IMPRESSION: No clear evidence of LEFT femur fracture. Ring of osteophytes extends over the femoral neck. If there is continue clinical suspicion for occult hip fracture or the patient refuses to weightbear, consider further evaluation with MRI or CT. Electronically Signed   By: Suzy Bouchard M.D.   On: 07/31/2016 21:19        Scheduled Meds: . anastrozole  1 mg Oral Daily  . carvedilol  6.25 mg Oral BID WC  . DULoxetine  20 mg Oral Daily  . furosemide  40 mg Oral Daily  . insulin aspart  0-15 Units  Subcutaneous TID WC  . insulin aspart  0-5 Units Subcutaneous QHS  . insulin glargine  16 Units Subcutaneous Q2200  . lisinopril  20 mg Oral Daily  . potassium chloride  40 mEq Oral BID  . pramipexole  1 mg Oral BID   Continuous Infusions:   LOS: 0 days    Time spent: 25 minutes.     Elmarie Shiley, MD [facility] Pager [phone]  If 7PM-7AM, please contact night-coverage [email or web address] Password [email or web address] 08/01/2016, 1:46 PM

## 2016-08-01 NOTE — Care Management Note (Signed)
Case Management Note  Patient Details  Name: SHAUNTELL IGLESIA MRN: [id number] Date of Birth: Aug 11, 1940  Subjective/Objective:    [place],  Left distal femur periprosthetic fracture              Action/Plan: Discharge Planning: Pt sleep, spoke to husband at bedside. States pt has CPAP at home but she does not wear due to her back pain and RW.  States he is requesting SNF -rehab. CSW following for SNF placement.    PCP [name] MD  Expected Discharge Date:  08/02/16               Expected Discharge Plan:  [place]  In-House Referral:  Clinical Social Work  Discharge planning Services  CM Consult  Post Acute Care Choice:  NA Choice offered to:  NA  DME Arranged:  N/A DME Agency:  NA  HH Arranged:  NA HH Agency:     Status of Service:  In process, will continue to follow  If discussed at Long Length of Stay Meetings, dates discussed:    Additional Comments:  Erenest Rasher, RN 08/01/2016, 5:34 PM

## 2016-08-01 NOTE — NC FL2 (Signed)
[place] LEVEL OF CARE SCREENING TOOL     IDENTIFICATION  Patient Name: [name] Birthdate: [birth year] Sex: female Admission Date (Current Location): 07/31/2016  [facility] and [place] Number:  Herbalist and Address:  [facility],  [place] [address], [place]      Provider Number: [id number]  Attending Physician Name and Address:  Elmarie Shiley, MD  Relative Name and Phone Number:       Current Level of Care: Hospital Recommended Level of Care: [place] Prior Approval Number:    Date Approved/Denied:   PASRR Number:    Discharge Plan: SNF    Current Diagnoses: Patient Active Problem List   Diagnosis Date Noted  . Fracture of knee prosthesis ([place]) 08/01/2016  . Uncontrolled pain 08/01/2016  . Obesity 02/20/2016  . Chronic pain syndrome 02/19/2016  . Mild episode of recurrent major depressive disorder ([place]) 08/02/2015  . Chronic diastolic congestive heart failure ([place]) 07/16/2015  . Type 2 diabetes mellitus with diabetic neuropathy ([place]) 09/27/2014  . Hearing loss - has hearing aides, follow by audiologist 10/11/2013  . No blood products ([place])  09/11/2013  . Disorder of bone and cartilage 06/10/2010  . Breast cancer of upper-outer quadrant of right female breast ([place]) 04/04/2010  . RESTLESS LEGS SYNDROME 05/05/2007  . Obstructive sleep apnea 03/01/2007  . Essential hypertension 01/27/2007  . Osteoarthritis 01/27/2007    Orientation RESPIRATION BLADDER Height & Weight     Self, Time, Situation, Place (Normally fully oriented; currently sleepy and lethargic)  Normal Incontinent (incontinence not baseline) Weight: 192 lb (87.1 kg) Height:  5\' 1"  (154.9 cm)  BEHAVIORAL SYMPTOMS/MOOD NEUROLOGICAL BOWEL NUTRITION STATUS      Incontinent (incontinence not baseline) Diet (Heart Health/Carb Modified)  AMBULATORY STATUS COMMUNICATION OF NEEDS Skin   Extensive Assist (Pt is non-surgical femur  fx. NWB Left leg- Immobilizer) Verbally Normal                       Personal Care Assistance Level of Assistance  Bathing, Dressing Bathing Assistance: Maximum assistance   Dressing Assistance: Maximum assistance     Functional Limitations Info             SPECIAL CARE FACTORS FREQUENCY  PT (By licensed PT), OT (By licensed OT) (NWB on Left left- Immobilizer)     PT Frequency: 5 OT Frequency: 5            Contractures Contractures Info: Not present    Additional Factors Info  Code Status, Allergies, Insulin Sliding Scale Code Status Info: Full Code Allergies Info: Sulfonamide Derivatives   Insulin Sliding Scale Info: SS Novolog 0-5 units HS, 0-15 U TID w/ meals       Current Medications (08/01/2016):  This is the current hospital active medication list Current Facility-Administered Medications  Medication Dose Route Frequency Provider Last Rate Last Dose  . acetaminophen (TYLENOL) tablet 650 mg  650 mg Oral Q6H PRN Edwin Dada, MD       Or  . acetaminophen (TYLENOL) suppository 650 mg  650 mg Rectal Q6H PRN Edwin Dada, MD      . anastrozole (ARIMIDEX) tablet 1 mg  1 mg Oral Daily Edwin Dada, MD   1 mg at 08/01/16 1118  . carvedilol (COREG) tablet 6.25 mg  6.25 mg Oral BID WC Edwin Dada, MD   6.25 mg at 08/01/16 0925  . DULoxetine (CYMBALTA) DR capsule 20  mg  20 mg Oral Daily Edwin Dada, MD   20 mg at 08/01/16 1059  . hydrALAZINE (APRESOLINE) injection 5 mg  5 mg Intravenous Q6H PRN Edwin Dada, MD   5 mg at 08/01/16 0247  . insulin aspart (novoLOG) injection 0-15 Units  0-15 Units Subcutaneous TID WC Edwin Dada, MD   3 Units at 08/01/16 1058  . insulin aspart (novoLOG) injection 0-5 Units  0-5 Units Subcutaneous QHS Edwin Dada, MD      . lisinopril (PRINIVIL,ZESTRIL) tablet 20 mg  20 mg Oral Daily Edwin Dada, MD   20 mg at 08/01/16 1059  . methocarbamol (ROBAXIN)  tablet 500 mg  500 mg Oral Q8H PRN Belkys A Regalado, MD      . ondansetron (ZOFRAN) tablet 4 mg  4 mg Oral Q6H PRN Edwin Dada, MD       Or  . ondansetron (ZOFRAN) injection 4 mg  4 mg Intravenous Q6H PRN Edwin Dada, MD      . oxyCODONE (Oxy IR/ROXICODONE) immediate release tablet 5 mg  5 mg Oral Q6H PRN Belkys A Regalado, MD      . polyethylene glycol (MIRALAX / GLYCOLAX) packet 17 g  17 g Oral Daily PRN Edwin Dada, MD      . potassium chloride SA (K-DUR,KLOR-CON) CR tablet 40 mEq  40 mEq Oral BID Edwin Dada, MD   40 mEq at 08/01/16 1058  . pramipexole (MIRAPEX) tablet 1 mg  1 mg Oral BID Edwin Dada, MD   1 mg at 08/01/16 1058     Discharge Medications: Please see discharge summary for a list of discharge medications.  Relevant Imaging Results:  Relevant Lab Results:   Additional Information SSN:  [id number]  PATIENT IS A [place] WITNESS- NO BLOOD PRODUCTS.  Williemae Area, LCSW

## 2016-08-01 NOTE — H&P (Addendum)
History and Physical  Patient Name: [name]     PJA:[id number]    DOB: 10-22-1940    DOA: 07/31/2016 PCP: [name]., DO   Patient coming from: Home  Chief Complaint: Pain  HPI: [name] is a 77 y.o. female with a past medical history significant for IDDM, HTN, BrCa on Arimidex who presents with fall.  Caveat that patient is sedated from IV opioid and unable to provider her own history.  Per report, she has been unable to sleep for the past few days due to chronic back and diffuse body pain. She notes this morning while she was standing she was trying to fall asleep which resulted in her falling and landing on her left knee. Denies head injury or LOC. Patient reports she has continued to have pain to her left knee throughout the day and has been unable to ambulate resulting her coming to the ED. Patient states she uses a cane/walker typically only when going outside of her house. Patient denies any syncopal episode or symptoms prior to fall. She currently denies headache, visual changes, lightheadedness, dizziness, neck/back pain, chest pain, abdominal pain, shortness of breath, palpitations, urinary symptoms, numbness, tingling, weakness. Patient denies taking any of her home medications today. Denies taking any pain meds prior to arrival. Denies use of anticoagulants.  ED course: -Afebrile, heart rate 99, respirations and pulse is normal, blood pressure 226/111 -Na 137, K 3.0, Cr 0.51, WBC 12.5K, Hgb 13.9 -Glucose 180 -ECG shows tachycardia -Radiographically of the right knee showed a periprosthetic fracture -Case was discussed with [employer], who recommended surgical repair, after control of blood pressure and diabetes      ROS: Review of Systems  Unable to perform ROS: Patient unresponsive    After medications. Although family report she has primarily complained of low back pain in the last few days and that this is becoming unbearable.      Past  Medical History:  Diagnosis Date  . AK (actinic keratosis)   . Anxiety    occasional  . Breast cancer ([place])    R breast, s/p radiation 34Gy/96f 04/29/10-05/05/10  . CTS (carpal tunnel syndrome)    Left  . Diabetes ([place]   . Diastolic CHF ([place]    Dx w/ hospitalization 06/2015 for respiratory failure  . Falls frequently    "can not pick left leg up"  . Foot fracture   . Hearing loss    bilateral hearing aides  . History of environmental allergies   . History of kidney stones 20 years ago  . Hypertension   . MDD (major depressive disorder)   . No blood products ([place])  09/11/2013  . Osteoarthritis    hx shoulder, knee, back, wrist pain; saw Dr. AWynelle Link  . RESTLESS LEGS SYNDROME 05/05/2007   Qualifier: Diagnosis of  By: CGwenette GreetMD, KArmando Reichert  . RLS (restless legs syndrome)   . Sleep apnea     Past Surgical History:  Procedure Laterality Date  . APPENDECTOMY  age 76 . BREAST SURGERY  2012   rt breast lumpectomy  . flex signoidoscopy    . INCISION AND DRAINAGE PERIRECTAL ABSCESS    . JOINT REPLACEMENT Right 2006   knee  . TOTAL KNEE ARTHROPLASTY Left 08/06/2014   Procedure: LEFT TOTAL KNEE ARTHROPLASTY;  Surgeon: FGaynelle Arabian MD;  Location: [facility];  Service: Orthopedics;  Laterality: Left;  . TUBAL LIGATION  1979    Social History: Patient lives with  her husband. She is not a smoker.  SHe was a homemaker.  Uses no assistive walking device within house, only outside (cane or walker).  Allergies  Allergen Reactions  . Sulfonamide Derivatives Swelling    Lips Swelling  . Other     BLOOD PRODUCT REFUSAL    Family history: family history includes Alcohol abuse in her father; Cancer in her sister; Diabetes in her other; Heart disease in her brother and paternal grandfather; Obesity in her other.  Prior to Admission medications   Medication Sig Start Date End Date Taking? Authorizing Provider  Acetaminophen-Codeine 300-30 MG tablet Take 1 tablet by mouth every  4 (four) hours as needed for pain.   Yes Historical Provider, MD  anastrozole (ARIMIDEX) 1 MG tablet TAKE ONE TABLET BY MOUTH ONCE DAILY 12/03/15  Yes Nicholas Lose, MD  carvedilol (COREG) 3.125 MG tablet Take 1 tablet (3.125 mg total) by mouth 2 (two) times daily with a meal. 04/06/16  Yes [name], DO  cholecalciferol (VITAMIN D) 1000 units tablet Take 1,000 Units by mouth 2 (two) times daily.   Yes Historical Provider, MD  diphenhydramine-acetaminophen (TYLENOL PM) 25-500 MG TABS Take 1 tablet by mouth at bedtime as needed (for pain /sleep).   Yes Historical Provider, MD  DULoxetine (CYMBALTA) 20 MG capsule Take 1 capsule (20 mg total) by mouth daily. Patient taking differently: Take 20 mg by mouth daily as needed. Mood 05/22/16  Yes [name], DO  HYDROcodone-acetaminophen (NORCO) 7.5-325 MG tablet Take 1 tablet by mouth every 6 (six) hours as needed for moderate pain. 04/02/16  Yes Dorothyann Peng, NP  Insulin Glargine (LANTUS SOLOSTAR) 100 UNIT/ML Solostar Pen Inject 16 Units into the skin daily at 10 pm. 07/14/16  Yes [name], DO  Insulin Pen Needle (BD PEN NEEDLE NANO U/F) 32G X 4 MM MISC Use as directed once a day 05/22/16  Yes [name], DO  lisinopril (PRINIVIL,ZESTRIL) 20 MG tablet Take 1 tablet (20 mg total) by mouth daily. 04/06/16  Yes [name], DO  metFORMIN (GLUCOPHAGE) 1000 MG tablet Take 1 tablet (1,000 mg total) by mouth 2 (two) times daily. 04/06/16  Yes [name], DO  methocarbamol (ROBAXIN) 500 MG tablet Take 500 mg by mouth every 6 (six) hours as needed for muscle spasms. Reported on 09/25/2015   Yes Historical Provider, MD  potassium chloride (K-DUR) 10 MEQ tablet Take 1 tablet (10 mEq total) by mouth daily. 10/22/15  Yes Minus Breeding, MD  potassium chloride SA (K-DUR,KLOR-CON) 20 MEQ tablet Take 20 mEq by mouth daily.   Yes Historical Provider, MD  pramipexole (MIRAPEX) 1 MG tablet TAKE ONE TABLET BY MOUTH TWICE DAILY 02/18/16  Yes [name], DO  Pyridoxine HCl  (VITAMIN B-6 PO) Take by mouth.   Yes Historical Provider, MD  furosemide (LASIX) 40 MG tablet Take 1 tablet (40 mg total) by mouth daily. 04/06/16   [name], DO       Physical Exam: BP (!) 142/69   Pulse 93   Temp 98 F (36.7 C) (Axillary)   Resp (!) 21   Ht '5\' 1"'  (1.549 m)   Wt 87.1 kg (192 lb)   SpO2 94%   BMI 36.28 kg/m  General appearance: Well-developed, obese adult female, asleep, sedated, rouses to tactile stimuli, does not answer questions or open eyes.  Eyes: Anicteric, conjunctiva pink, lids and lashes normal.  ENT: No nasal deformity, discharge, epistaxis.  OP dry without lesions.  Neck: No neck masses.  Trachea midline.  No thyromegaly/tenderness. Skin: Warm and dry.  No jaundice.  No suspicious rashes or lesions. Cardiac: Tachycardic, mildly, regular, nl S1-S2, no murmurs appreciated.  Capillary refill is brisk.  JVP not visible.  No LE edema.  Radial pulses 2+ and symmetric. Respiratory: Normal respiratory rate and rhythm.  CTAB without rales or wheezes. Abdomen: Abdomen soft.  No TTP. No ascites, distension, hepatosplenomegaly.   MSK: No deformities or effusions that I appreciate.  No cyanosis or clubbing. Neuro: Moves both upper extremities spontaneously but does not follow commands.  Makes no intelligible verbalizations but groans to tactile stimuli.   Psych: Unable to assess.     Labs on Admission:  I have personally reviewed following labs and imaging studies: CBC:  Recent Labs Lab 07/31/16 1932  WBC 12.5*  NEUTROABS 9.9*  HGB 13.9  HCT 41.6  MCV 90.2  PLT 782   Basic Metabolic Panel:  Recent Labs Lab 07/31/16 1932  NA 137  K 3.0*  CL 101  CO2 26  GLUCOSE 180*  BUN 9  CREATININE 0.51  CALCIUM 8.8*   GFR: Estimated Creatinine Clearance: 59 mL/min (by C-G formula based on SCr of 0.51 mg/dL).  Liver Function Tests: No results for input(s): AST, ALT, ALKPHOS, BILITOT, PROT, ALBUMIN in the last 168 hours. No results for input(s):  LIPASE, AMYLASE in the last 168 hours. No results for input(s): AMMONIA in the last 168 hours. Coagulation Profile: No results for input(s): INR, PROTIME in the last 168 hours. Cardiac Enzymes: No results for input(s): CKTOTAL, CKMB, CKMBINDEX, TROPONINI in the last 168 hours. BNP (last 3 results) No results for input(s): PROBNP in the last 8760 hours. HbA1C: No results for input(s): HGBA1C in the last 72 hours. CBG: No results for input(s): GLUCAP in the last 168 hours. Lipid Profile: No results for input(s): CHOL, HDL, LDLCALC, TRIG, CHOLHDL, LDLDIRECT in the last 72 hours. Thyroid Function Tests: No results for input(s): TSH, T4TOTAL, FREET4, T3FREE, THYROIDAB in the last 72 hours. Anemia Panel: No results for input(s): VITAMINB12, FOLATE, FERRITIN, TIBC, IRON, RETICCTPCT in the last 72 hours. Sepsis Labs:  Invalid input(s): PROCALCITONIN, LACTICIDVEN No results found for this or any previous visit (from the past 240 hour(s)).       Radiological Exams on Admission: Personally reviewed CT report, and Knee radiograph showing small, age (to me) indeterminate fracture near her prosthesis: Ct Knee Left Wo Contrast  Result Date: 08/01/2016 CLINICAL DATA:  76 year old female with left knee arthroplasty. Evaluate for fracture. EXAM: CT OF THE left KNEE WITHOUT CONTRAST TECHNIQUE: Multidetector CT imaging of the left knee was performed according to the standard protocol. Multiplanar CT image reconstructions were also generated. COMPARISON:  Left knee radiograph dated 07/31/2016 FINDINGS: Evaluation of this exam is very limited due to streak artifact caused by metallic knee arthroplasty. Bones/Joint/Cartilage There is a total left knee arthroplasty. The arthroplasty components appear intact and in anatomic alignment. No evidence of hardware loosening noted. There is faint linear lucency in the medial aspect of the proximal tibial ( series 7, image 53) which may be artifactual or represent a  nondisplaced fracture. No the a other acute fracture identified. There is heterotopic bone formation of the posterior femoral metaphysis at the arthroplasty interface. There is marrow heterogeneity of the distal femur. There is no dislocation. There is moderate amount of suprapatellar effusion. Correlation with clinical exam is recommended to evaluate for possibility of infection. Ligaments Suboptimally assessed by CT. Muscles and Tendons No  intramuscular hematoma.  No definite acute tendon injury. Soft tissues There is mild subcutaneous soft tissue edema of the anterior knee. IMPRESSION: Very limited evaluation for fracture due to advanced osteopenia and streak artifact caused by metallic knee arthroplasty. There is an area of cortical irregularity versus less likely nondisplaced cortical fracture involving the medial tibial metaphysis. No other acute fracture identified. There is. Ectopic bone formation posterior to the distal femoral metaphysis at the interface with the arthroplasty. Large suprapatellar effusion with mild edema of the subcutaneous soft tissues of the anterior knee. Correlation with clinical exam is recommended to evaluate for possible infection. Electronically Signed   By: Anner Crete M.D.   On: 08/01/2016 00:08   Dg Knee Complete 4 Views Left  Result Date: 07/31/2016 CLINICAL DATA:  76 y/o  F; status post fall with pain. EXAM: LEFT KNEE - COMPLETE 4+ VIEW COMPARISON:  02/07/2014 knee radiographs. FINDINGS: Posterior distal femur fracture at the bone prosthesis interface. Total knee arthroplasty and patellar resurfacing with prosthesis. Calcific bodies within the joint space. No addition periprosthetic lucency. IMPRESSION: Posterior distal femur fracture at the bone prosthesis interface. Electronically Signed   By: Kristine Garbe M.D.   On: 07/31/2016 21:19   Dg Hip Unilat With Pelvis 2-3 Views Left  Result Date: 07/31/2016 CLINICAL DATA:  Pt states she was standing and  fell asleep and fell down this morning at her home on carpet. Pt has a hx of falling and has restless leg syndrome. Best obtainable EXAM: DG HIP (WITH OR WITHOUT PELVIS) 2-3V LEFT COMPARISON:  None. FINDINGS: Hips are located. No evidence of pelvic fracture or sacral fracture. Dedicated view of the LEFT hip demonstrates no femoral neck fracture. There is a ring of osteophytes which marks over the femoral neck. No clear evidence fracture. IMPRESSION: No clear evidence of LEFT femur fracture. Ring of osteophytes extends over the femoral neck. If there is continue clinical suspicion for occult hip fracture or the patient refuses to weightbear, consider further evaluation with MRI or CT. Electronically Signed   By: Suzy Bouchard M.D.   On: 07/31/2016 21:19    EKG: Independently reviewed. Rate 93, QTc 443, sinus tachycardia, no ST changes.    Assessment/Plan  1. Fracture of knee prosthesis:  Overall she has a low perioperative cardiac risk (RCRI 1, Gupta score 0.77%).   -Per Orthopedics   2. Hypertension:  -Ensure adequate pain control -Continue home carvedilol dose increased to 6.25 BID -Continue lisinopril, hold day of surgery -Hydralazine IV PRN for severe range pressures  3. Insulin dependent diabetes:  -Continue home Lantus 16 -SSI with meals -Check HgbA1c  4. Hypokalemia:  -Replace K -Check mag  5. Chronic pain:  -Continue Robaxin, new Cymbalta -Acetaminophen  6. Other medications: -Continue anastrozole -Continue pramipexole  7. Back pain: This is evidently from DDD (per family, they report seeing someone from [facility] ortho recently, MRI results are not available to me).  Likely this will be a more significant source of discomfort for the patient than her knee.    DVT prophylaxis: SCDs  Code Status: FULL  Family Communication: Husband at bedside  Disposition Plan: Anticipate overnight restarting BP meds, insulin for tight diabetes control.  Surgery per  Ortho. Consults called: Orthopedics, Dr. Doran Durand Admission status: INPATIENT        Medical decision making: Patient seen at 12:15 AM on 08/01/2016.  The patient was discussed with Dr. Doran Durand and Harlene Ramus, PA-C.  What exists of the patient's chart was reviewed in depth and  summarized above.  Clinical condition: stable.        Edwin Dada [facility] Pager [phone]

## 2016-08-02 DIAGNOSIS — Z888 Allergy status to other drugs, medicaments and biological substances status: Secondary | ICD-10-CM | POA: Diagnosis not present

## 2016-08-02 DIAGNOSIS — E876 Hypokalemia: Secondary | ICD-10-CM | POA: Diagnosis present

## 2016-08-02 DIAGNOSIS — W1830XA Fall on same level, unspecified, initial encounter: Secondary | ICD-10-CM | POA: Diagnosis present

## 2016-08-02 DIAGNOSIS — S72402A Unspecified fracture of lower end of left femur, initial encounter for closed fracture: Secondary | ICD-10-CM | POA: Diagnosis present

## 2016-08-02 DIAGNOSIS — Z87442 Personal history of urinary calculi: Secondary | ICD-10-CM | POA: Diagnosis not present

## 2016-08-02 DIAGNOSIS — E1140 Type 2 diabetes mellitus with diabetic neuropathy, unspecified: Secondary | ICD-10-CM | POA: Diagnosis present

## 2016-08-02 DIAGNOSIS — M549 Dorsalgia, unspecified: Secondary | ICD-10-CM | POA: Diagnosis present

## 2016-08-02 DIAGNOSIS — G4733 Obstructive sleep apnea (adult) (pediatric): Secondary | ICD-10-CM | POA: Diagnosis present

## 2016-08-02 DIAGNOSIS — I5032 Chronic diastolic (congestive) heart failure: Secondary | ICD-10-CM | POA: Diagnosis present

## 2016-08-02 DIAGNOSIS — Z882 Allergy status to sulfonamides status: Secondary | ICD-10-CM | POA: Diagnosis not present

## 2016-08-02 DIAGNOSIS — Z79899 Other long term (current) drug therapy: Secondary | ICD-10-CM | POA: Diagnosis not present

## 2016-08-02 DIAGNOSIS — M25562 Pain in left knee: Secondary | ICD-10-CM | POA: Diagnosis present

## 2016-08-02 DIAGNOSIS — G2581 Restless legs syndrome: Secondary | ICD-10-CM | POA: Diagnosis present

## 2016-08-02 DIAGNOSIS — G894 Chronic pain syndrome: Secondary | ICD-10-CM | POA: Diagnosis present

## 2016-08-02 DIAGNOSIS — H9190 Unspecified hearing loss, unspecified ear: Secondary | ICD-10-CM | POA: Diagnosis present

## 2016-08-02 DIAGNOSIS — Z9181 History of falling: Secondary | ICD-10-CM | POA: Diagnosis not present

## 2016-08-02 DIAGNOSIS — Z833 Family history of diabetes mellitus: Secondary | ICD-10-CM | POA: Diagnosis not present

## 2016-08-02 DIAGNOSIS — N390 Urinary tract infection, site not specified: Secondary | ICD-10-CM | POA: Diagnosis present

## 2016-08-02 DIAGNOSIS — C50911 Malignant neoplasm of unspecified site of right female breast: Secondary | ICD-10-CM | POA: Diagnosis present

## 2016-08-02 DIAGNOSIS — Z794 Long term (current) use of insulin: Secondary | ICD-10-CM | POA: Diagnosis not present

## 2016-08-02 DIAGNOSIS — Z923 Personal history of irradiation: Secondary | ICD-10-CM | POA: Diagnosis not present

## 2016-08-02 DIAGNOSIS — F329 Major depressive disorder, single episode, unspecified: Secondary | ICD-10-CM | POA: Diagnosis present

## 2016-08-02 DIAGNOSIS — I110 Hypertensive heart disease with heart failure: Secondary | ICD-10-CM | POA: Diagnosis present

## 2016-08-02 DIAGNOSIS — T84019A Broken internal joint prosthesis, unspecified site, initial encounter: Secondary | ICD-10-CM | POA: Diagnosis not present

## 2016-08-02 DIAGNOSIS — Z79811 Long term (current) use of aromatase inhibitors: Secondary | ICD-10-CM | POA: Diagnosis not present

## 2016-08-02 DIAGNOSIS — M9712XA Periprosthetic fracture around internal prosthetic left knee joint, initial encounter: Secondary | ICD-10-CM | POA: Diagnosis present

## 2016-08-02 DIAGNOSIS — R000 Tachycardia, unspecified: Secondary | ICD-10-CM | POA: Diagnosis present

## 2016-08-02 LAB — GLUCOSE, CAPILLARY
GLUCOSE-CAPILLARY: 170 mg/dL — AB (ref 65–99)
GLUCOSE-CAPILLARY: 177 mg/dL — AB (ref 65–99)
GLUCOSE-CAPILLARY: 194 mg/dL — AB (ref 65–99)
Glucose-Capillary: 200 mg/dL — ABNORMAL HIGH (ref 65–99)

## 2016-08-02 LAB — VITAMIN B12: Vitamin B-12: 249 pg/mL (ref 180–914)

## 2016-08-02 MED ORDER — OXYCODONE HCL 5 MG PO TABS: 5.0000 mg | ORAL_TABLET | Freq: Four times a day (QID) | ORAL | 0 refills | Status: DC | PRN

## 2016-08-02 MED ORDER — DEXTROSE 5 % IV SOLN
1.0000 g | INTRAVENOUS | Status: DC
  Administered 2016-08-02 – 2016-08-03 (×2): 1 g via INTRAVENOUS
  Filled 2016-08-02 (×3): qty 10

## 2016-08-02 MED ORDER — VITAMIN B-12 100 MCG PO TABS
100.0000 ug | ORAL_TABLET | Freq: Every day | ORAL | Status: DC
  Administered 2016-08-02 – 2016-08-04 (×3): 100 ug via ORAL
  Filled 2016-08-02 (×3): qty 1

## 2016-08-02 MED ORDER — FUROSEMIDE 40 MG PO TABS
40.0000 mg | ORAL_TABLET | Freq: Every day | ORAL | Status: DC
  Administered 2016-08-02 – 2016-08-04 (×3): 40 mg via ORAL
  Filled 2016-08-02 (×3): qty 1

## 2016-08-02 MED ORDER — POLYETHYLENE GLYCOL 3350 17 G PO PACK
17.0000 g | PACK | Freq: Every day | ORAL | Status: DC
  Administered 2016-08-02 – 2016-08-04 (×3): 17 g via ORAL
  Filled 2016-08-02 (×3): qty 1

## 2016-08-02 MED ORDER — POLYETHYLENE GLYCOL 3350 17 G PO PACK: 17.0000 g | PACK | Freq: Every day | ORAL | 0 refills | Status: DC | PRN

## 2016-08-02 MED ORDER — CEPHALEXIN 500 MG PO CAPS
500.0000 mg | ORAL_CAPSULE | Freq: Three times a day (TID) | ORAL | 0 refills | Status: DC
Start: 2016-08-02 — End: 2016-11-16

## 2016-08-02 MED ORDER — BISACODYL 10 MG RE SUPP
10.0000 mg | Freq: Once | RECTAL | Status: DC
  Filled 2016-08-02: qty 1

## 2016-08-02 MED ORDER — CYANOCOBALAMIN 100 MCG PO TABS: 100.0000 ug | ORAL_TABLET | Freq: Every day | ORAL | 0 refills | Status: DC

## 2016-08-02 NOTE — Evaluation (Signed)
Occupational Therapy Evaluation Patient Details Name: [name] MRN: [id number] DOB: 02/18/1941 Today's Date: 08/02/2016    History of Present Illness Pt admitted after fall resulting in L periprosthetic fx. Pt to be treated conservatively with KI and NWB.  PMH: DM, HTN, BrCa, chronic back pain.   Clinical Impression   Pt was independent in self care and ambulated with a RW prior to admission. She relied on her husband for IADL. Pt presents with anxiety with movement, minimal pain, impaired cognition, generalized weakness and decreased standing balance. She requires total assist for LB and min to mod assist for UB. Mobility limited to standing at EOB due to pt with loose stools. Will follow acutely, recommending post acute rehab prior to return home.    Follow Up Recommendations  SNF;Supervision/Assistance - 24 hour    Equipment Recommendations       Recommendations for Other Services       Precautions / Restrictions Precautions Precautions: Fall Required Braces or Orthoses: Knee Immobilizer - Left Knee Immobilizer - Left: On at all times Restrictions Weight Bearing Restrictions: Yes LLE Weight Bearing: Non weight bearing      Mobility Bed Mobility Overal bed mobility: Needs Assistance Bed Mobility: Supine to Sit;Sit to Supine     Supine to sit: +2 for physical assistance;Max assist Sit to supine: +2 for physical assistance;Max assist   General bed mobility comments: pivoted with bed pad, assist for LEs and trunk  Transfers Overall transfer level: Needs assistance Equipment used: Rolling walker (2 wheeled) Transfers: Sit to/from Stand Sit to Stand: +2 physical assistance;Mod assist;From elevated surface         General transfer comment: assist to rise, steady and maintain NWB on L LE from EOB    Balance Overall balance assessment: Needs assistance   Sitting balance-Leahy Scale: Fair       Standing balance-Leahy Scale: Poor                               ADL Overall ADL's : Needs assistance/impaired Eating/Feeding: Independent;Bed level   Grooming: Wash/dry hands;Sitting;Supervision/safety   Upper Body Bathing: Moderate assistance;Sitting   Lower Body Bathing: Total assistance;+2 for physical assistance;Sit to/from stand   Upper Body Dressing : Minimal assistance;Sitting   Lower Body Dressing: Total assistance;+2 for physical assistance;Sit to/from stand         Toileting - Clothing Manipulation Details (indicate cue type and reason): total assist for bed pan use and pericare             Vision Baseline Vision/History: Wears glasses Wears Glasses: At all times Patient Visual Report: No change from baseline       Perception     Praxis      Pertinent Vitals/Pain Pain Assessment: Faces Faces Pain Scale: Hurts a little bit Pain Location: L knee     Hand Dominance Right   Extremity/Trunk Assessment Upper Extremity Assessment Upper Extremity Assessment: Generalized weakness   Lower Extremity Assessment Lower Extremity Assessment: Defer to PT evaluation   Cervical / Trunk Assessment Cervical / Trunk Assessment: Other exceptions (hx of chronic back pain)   Communication Communication Communication: HOH (L ear better)   Cognition Arousal/Alertness: Awake/alert Behavior During Therapy: Anxious Overall Cognitive Status: No family/caregiver present to determine baseline cognitive functioning Area of Impairment: Memory;Following commands;Problem solving     Memory: Decreased short-term memory Following Commands: Follows one step commands with increased time (and multimodal cues)       Problem Solving: Slow processing;Decreased initiation;Difficulty sequencing;Requires verbal cues;Requires tactile cues     General Comments       Exercises       Shoulder Instructions      Home Living Family/patient expects to be discharged to:: Skilled nursing facility Living Arrangements: Spouse/significant  other Available Help at Discharge: Family Type of Home: House Home Access: Stairs to enter;Ramped entrance (one step at either end of ramp)     Home Layout: One level     Bathroom Shower/Tub: Occupational psychologist: Standard     Home Equipment: Environmental consultant - 2 wheels;Bedside commode;Shower seat          Prior Functioning/Environment Level of Independence: Needs assistance  Gait / Transfers Assistance Needed: ambulated with RW ADL's / Homemaking Assistance Needed: independent in self care, husband responsible for housekeeping and cooking            OT Problem List: Decreased strength;Decreased activity tolerance;Impaired balance (sitting and/or standing);Decreased cognition;Decreased safety awareness;Decreased knowledge of use of DME or AE;Obesity;Pain      OT Treatment/Interventions: Self-care/ADL training;DME and/or AE instruction;Therapeutic activities;Patient/family education;Balance training    OT Goals(Current goals can be found in the care plan section) Acute Rehab OT Goals Patient Stated Goal: to fly to [facility] in May OT Goal Formulation: With patient Time For Goal Achievement: 08/16/16 Potential to Achieve Goals: Good ADL Goals Pt Will Perform Grooming: with set-up;sitting Pt Will Perform Upper Body Bathing: with min assist;sitting Pt Will Perform Upper Body Dressing: with supervision;sitting Pt Will Transfer to Toilet: with mod assist;stand pivot transfer;grab bars Pt Will Perform Toileting - Clothing Manipulation and hygiene: with min assist;sit to/from stand Additional ADL Goal #1: Pt will maintain NWB on L LE during ADL and ADL transfers with min verbal cues.  OT Frequency: Min 2X/week   Barriers to D/C:            Co-evaluation PT/OT/SLP Co-Evaluation/Treatment: Yes Reason for Co-Treatment: For patient/therapist safety   OT goals addressed during session: ADL's and self-care      End of Session Equipment Utilized During Treatment: Rolling  walker Nurse Communication: Mobility status  Activity Tolerance: Patient tolerated treatment well Patient left: in bed;with call bell/phone within reach;with bed alarm set  OT Visit Diagnosis: Unsteadiness on feet (R26.81);History of falling (Z91.81);Pain;Other symptoms and signs involving cognitive function Pain - Right/Left: Left Pain - part of body: Knee                ADL either performed or assessed with clinical judgement  Time: 1044-1110 OT Time Calculation (min): 26 min Charges:  OT General Charges $OT Visit: 1 Procedure OT Evaluation $OT Eval Moderate Complexity: 1 Procedure G-Codes: OT G-codes **NOT FOR INPATIENT CLASS** Functional Assessment Tool Used: Clinical judgement Functional Limitation: Self care Self Care Current Status (G2563): At least 60 percent but less than 80 percent impaired, limited or restricted Self Care Goal Status (S9373): At least 20 percent but less than 40 percent impaired, limited or restricted   Malka So 08/02/2016, 11:21 AM  [phone]

## 2016-08-02 NOTE — Discharge Summary (Signed)
Physician Discharge Summary  [name] XTK:[id number] DOB: 10/27/1940 DOA: 07/31/2016  PCP: [name]., DO  Admit date: 07/31/2016 Discharge date: 08/02/2016  Admitted From: Home  Disposition: SNF  Recommendations for Outpatient Follow-up:  1. Follow up with PCP in 1-2 weeks 2. Please obtain BMP/CBC in one week 3. Please follow up on the following pending results: follow urine culture.  4. Follow up with Dr Nelva Bush for further care of Back pain. And knee fracture.     Discharge Condition: stable.  CODE STATUS: Full code.  Diet recommendation: Carb modified diet   Brief/Interim Summary: [name] a 77 y.o.femalewith a past medical history significant for IDDM, HTN, BrCa on Arimidexwho presents with fall.  Caveat that patient is sedated from IV opioid and unable to provider her own history. Per report, she has been unable to sleep for the past few days due to chronic back and diffuse body pain. She notes this morning while she was standing she was trying to fall asleep which resulted in her falling and landing on her left knee. Denies head injury or LOC. Patient reports she has continued to have pain to her left knee throughout the day and has been unable to ambulate resulting her coming to the ED. Patient states she uses a cane/walkertypically only when going outside of her house. Patient denies any syncopal episode or symptoms prior to fall. She currently denies headache, visual changes, lightheadedness, dizziness, neck/back pain, chest pain, abdominal pain, shortness of breath, palpitations, urinary symptoms, numbness, tingling, weakness. Patient denies taking any of her home medications today. Denies taking any pain meds prior to arrival. Denies use of anticoagulants.    Assessment & Plan:   Principal Problem:   Fracture of knee prosthesis (HCC) Active Problems:   Obstructive sleep apnea   Essential hypertension   Type 2 diabetes mellitus with diabetic  neuropathy (HCC)   Chronic diastolic congestive heart failure (HCC)   Chronic pain syndrome     Fracture of knee prosthesis: Overall she has a low perioperative cardiac risk (RCRI 1, Gupta score 0.77%).  -Per Orthopedics No plan fior surgery.  Need knee immobilizer.  PT NWB on the L LE with a knee immobilizer  AMS; resolved.  Patient sleep a lot at home after she has period of worsening back pain./  No significant hypercapnia on ABG, CT head negative. Patient alert and oriented.   UTI;  Received one dose of ceftriaxone.  Discharge on keflex for 3 days.  Follow urine culture.   CPAP at HS.  Careful with sedatives.    Back pain: This is evidently from DDD (per family, they report seeing someone from [facility] ortho recently, MRI results are not available to me). Likely this will be a more significant source of discomfort for the patient than her knee. Follow with Dr Nelva Bush.  I Asked Orhto to evaluate and reviewed MRI done at Dr Nelva Bush office. .   Hypertension: -Continue home carvedilol dose increased to 6.25 BID -Continue lisinopril -Hydralazine IV PRN for severe range pressures  Insulin dependent diabetes: resume lantus at discharge.  -SSI with meals -Check HgbA1c  Hypokalemia: -Replace K replace mg  Chronic pain: -Continue Robaxin, careful with sedation , new Cymbalta -Acetaminophen  Other medications: -Continue anastrozole -Continue pramipexole    Discharge Diagnoses:  Principal Problem:   Fracture of knee prosthesis ([place]) Active Problems:   Obstructive sleep apnea   Essential hypertension   Type 2 diabetes mellitus with diabetic neuropathy (HCC)   Chronic diastolic  congestive heart failure (HCC)   Chronic pain syndrome   Uncontrolled pain    Discharge Instructions  Discharge Instructions    Diet - low sodium heart healthy    Complete by:  As directed    Increase activity slowly    Complete by:  As directed    Non weight  bearing    Complete by:  As directed    Laterality:  left   Extremity:  Lower     Allergies as of 08/02/2016      Reactions   Sulfonamide Derivatives Swelling   Lips Swelling   Other    BLOOD PRODUCT REFUSAL      Medication List    STOP taking these medications   Acetaminophen-Codeine 300-30 MG tablet   diphenhydramine-acetaminophen 25-500 MG Tabs tablet Commonly known as:  TYLENOL PM   HYDROcodone-acetaminophen 7.5-325 MG tablet Commonly known as:  NORCO   potassium chloride SA 20 MEQ tablet Commonly known as:  K-DUR,KLOR-CON     TAKE these medications   anastrozole 1 MG tablet Commonly known as:  ARIMIDEX TAKE ONE TABLET BY MOUTH ONCE DAILY   carvedilol 3.125 MG tablet Commonly known as:  COREG Take 1 tablet (3.125 mg total) by mouth 2 (two) times daily with a meal.   cephALEXin 500 MG capsule Commonly known as:  KEFLEX Take 1 capsule (500 mg total) by mouth 3 (three) times daily.   cholecalciferol 1000 units tablet Commonly known as:  VITAMIN D Take 1,000 Units by mouth 2 (two) times daily.   cyanocobalamin 100 MCG tablet Take 1 tablet (100 mcg total) by mouth daily.   DULoxetine 20 MG capsule Commonly known as:  CYMBALTA Take 1 capsule (20 mg total) by mouth daily. What changed:  when to take this  reasons to take this  additional instructions   furosemide 40 MG tablet Commonly known as:  LASIX Take 1 tablet (40 mg total) by mouth daily.   Insulin Glargine 100 UNIT/ML Solostar Pen Commonly known as:  LANTUS SOLOSTAR Inject 16 Units into the skin daily at 10 pm.   Insulin Pen Needle 32G X 4 MM Misc Commonly known as:  BD PEN NEEDLE NANO U/F Use as directed once a day   lisinopril 20 MG tablet Commonly known as:  PRINIVIL,ZESTRIL Take 1 tablet (20 mg total) by mouth daily.   metFORMIN 1000 MG tablet Commonly known as:  GLUCOPHAGE Take 1 tablet (1,000 mg total) by mouth 2 (two) times daily.   methocarbamol 500 MG tablet Commonly known  as:  ROBAXIN Take 500 mg by mouth every 6 (six) hours as needed for muscle spasms. Reported on 09/25/2015   oxyCODONE 5 MG immediate release tablet Commonly known as:  Oxy IR/ROXICODONE Take 1 tablet (5 mg total) by mouth every 6 (six) hours as needed for moderate pain.   polyethylene glycol packet Commonly known as:  MIRALAX / GLYCOLAX Take 17 g by mouth daily as needed for mild constipation.   potassium chloride 10 MEQ tablet Commonly known as:  K-DUR Take 1 tablet (10 mEq total) by mouth daily.   pramipexole 1 MG tablet Commonly known as:  MIRAPEX TAKE ONE TABLET BY MOUTH TWICE DAILY   VITAMIN B-6 PO Take by mouth.       Allergies  Allergen Reactions  . Sulfonamide Derivatives Swelling    Lips Swelling  . Other     BLOOD PRODUCT REFUSAL    Consultations:  Ortho    Procedures/Studies: Ct Head Wo Contrast  Result  Date: 08/01/2016 CLINICAL DATA:  76 year old female with altered mental status. Status post left knee surgery today. EXAM: CT HEAD WITHOUT CONTRAST TECHNIQUE: Contiguous axial images were obtained from the base of the skull through the vertex without intravenous contrast. COMPARISON:  Head CT dated 02/11/2016 FINDINGS: Brain: The ventricles and sulci appropriate size for patient's age. Moderate to advanced periventricular and deep white matter chronic microvascular ischemic changes noted. There is no acute intracranial hemorrhage. No mass effect or midline shift noted. No extra-axial fluid collection. Vascular: No hyperdense vessel or unexpected calcification. Skull: Normal. Negative for fracture or focal lesion. Sinuses/Orbits: No acute finding. Other: None. IMPRESSION: 1. No acute intracranial hemorrhage. 2. Moderate chronic microvascular ischemic changes. Electronically Signed   By: Anner Crete M.D.   On: 08/01/2016 18:49   Ct Knee Left Wo Contrast  Result Date: 08/01/2016 CLINICAL DATA:  76 year old female with left knee arthroplasty. Evaluate for  fracture. EXAM: CT OF THE left KNEE WITHOUT CONTRAST TECHNIQUE: Multidetector CT imaging of the left knee was performed according to the standard protocol. Multiplanar CT image reconstructions were also generated. COMPARISON:  Left knee radiograph dated 07/31/2016 FINDINGS: Evaluation of this exam is very limited due to streak artifact caused by metallic knee arthroplasty. Bones/Joint/Cartilage There is a total left knee arthroplasty. The arthroplasty components appear intact and in anatomic alignment. No evidence of hardware loosening noted. There is faint linear lucency in the medial aspect of the proximal tibial ( series 7, image 53) which may be artifactual or represent a nondisplaced fracture. No the a other acute fracture identified. There is heterotopic bone formation of the posterior femoral metaphysis at the arthroplasty interface. There is marrow heterogeneity of the distal femur. There is no dislocation. There is moderate amount of suprapatellar effusion. Correlation with clinical exam is recommended to evaluate for possibility of infection. Ligaments Suboptimally assessed by CT. Muscles and Tendons No intramuscular hematoma.  No definite acute tendon injury. Soft tissues There is mild subcutaneous soft tissue edema of the anterior knee. IMPRESSION: Very limited evaluation for fracture due to advanced osteopenia and streak artifact caused by metallic knee arthroplasty. There is an area of cortical irregularity versus less likely nondisplaced cortical fracture involving the medial tibial metaphysis. No other acute fracture identified. There is. Ectopic bone formation posterior to the distal femoral metaphysis at the interface with the arthroplasty. Large suprapatellar effusion with mild edema of the subcutaneous soft tissues of the anterior knee. Correlation with clinical exam is recommended to evaluate for possible infection. Electronically Signed   By: Anner Crete M.D.   On: 08/01/2016 00:08    Portable Chest 1 View  Result Date: 08/01/2016 CLINICAL DATA:  Preoperative study.  Right breast cancer. EXAM: PORTABLE CHEST 1 VIEW COMPARISON:  July 02, 2015 FINDINGS: No pneumothorax. Elevated left hemidiaphragm with atelectasis, unchanged. Unchanged cardiomediastinal silhouette. No overt edema. IMPRESSION: No interval change or acute abnormality. Elevated left hemidiaphragm with atelectasis, stable in the interval. Electronically Signed   By: Dorise Bullion III M.D   On: 08/01/2016 08:03   Dg Knee Complete 4 Views Left  Result Date: 07/31/2016 CLINICAL DATA:  76 y/o  F; status post fall with pain. EXAM: LEFT KNEE - COMPLETE 4+ VIEW COMPARISON:  02/07/2014 knee radiographs. FINDINGS: Posterior distal femur fracture at the bone prosthesis interface. Total knee arthroplasty and patellar resurfacing with prosthesis. Calcific bodies within the joint space. No addition periprosthetic lucency. IMPRESSION: Posterior distal femur fracture at the bone prosthesis interface. Electronically Signed   By: Kristine Garbe  M.D.   On: 07/31/2016 21:19   Dg Hip Unilat With Pelvis 2-3 Views Left  Result Date: 07/31/2016 CLINICAL DATA:  Pt states she was standing and fell asleep and fell down this morning at her home on carpet. Pt has a hx of falling and has restless leg syndrome. Best obtainable EXAM: DG HIP (WITH OR WITHOUT PELVIS) 2-3V LEFT COMPARISON:  None. FINDINGS: Hips are located. No evidence of pelvic fracture or sacral fracture. Dedicated view of the LEFT hip demonstrates no femoral neck fracture. There is a ring of osteophytes which marks over the femoral neck. No clear evidence fracture. IMPRESSION: No clear evidence of LEFT femur fracture. Ring of osteophytes extends over the femoral neck. If there is continue clinical suspicion for occult hip fracture or the patient refuses to weightbear, consider further evaluation with MRI or CT. Electronically Signed   By: Suzy Bouchard M.D.   On:  07/31/2016 21:19      Subjective: She is alert today. Her hearing aids needs batteries.  She has not had a BM.  Pain medications are helping with pain. She is tired of having pain. I epxplain that ortho will be by to speak about MRI of her back done at Dr Nelva Bush offices.  She denies suicidal thought    Discharge Exam: Vitals:   08/01/16 2257 08/02/16 0554  BP: (!) 145/77 (!) 156/79  Pulse: 74 81  Resp: 16 16  Temp: 98.3 F (36.8 C) 97.6 F (36.4 C)   Vitals:   08/01/16 1430 08/01/16 2156 08/01/16 2257 08/02/16 0554  BP: (!) 151/54  (!) 145/77 (!) 156/79  Pulse: 75  74 81  Resp: _0 Temp: 98.3 F (36.8 C)  98.3 F (36.8 C) 97.6 F (36.4 C)  TempSrc: Oral  Oral Oral  SpO2: 90%  97% 98%  Weight:      Height:        General: Pt is alert, awake, not in acute distress Cardiovascular: RRR, S1/S2 +, no rubs, no gallops Respiratory: CTA bilaterally, no wheezing, no rhonchi Abdominal: Soft, NT, ND, bowel sounds + Extremities: no edema, no cyanosis, left knee with immobilizer.     The results of significant diagnostics from this hospitalization (including imaging, microbiology, ancillary and laboratory) are listed below for reference.     Microbiology: No results found for this or any previous visit (from the past 240 hour(s)).   Labs: BNP (last 3 results) No results for input(s): BNP in the last 8760 hours. Basic Metabolic Panel:  Recent Labs Lab 07/31/16 1932 08/01/16 0800  NA 137 139  K 3.0* 3.0*  CL 101 102  CO2 26 28  GLUCOSE 180* 167*  BUN 9 14  CREATININE 0.51 0.69  CALCIUM 8.8* 8.8*  MG  --  1.7   Liver Function Tests: No results for input(s): AST, ALT, ALKPHOS, BILITOT, PROT, ALBUMIN in the last 168 hours. No results for input(s): LIPASE, AMYLASE in the last 168 hours.  Recent Labs Lab 08/01/16 1448  AMMONIA 16   CBC:  Recent Labs Lab 07/31/16 1932 08/01/16 0800  WBC 12.5* 12.7*  NEUTROABS 9.9*  --   HGB 13.9 12.8  HCT 41.6  39.3  MCV 90.2 89.3  PLT 273 281   Cardiac Enzymes: No results for input(s): CKTOTAL, CKMB, CKMBINDEX, TROPONINI in the last 168 hours. BNP: Invalid input(s): POCBNP CBG:  Recent Labs Lab 08/01/16 1026 08/01/16 1637 08/01/16 2204 08/02/16 0801  GLUCAP 151* 175* 177* 170*   D-Dimer No  results for input(s): DDIMER in the last 72 hours. Hgb A1c No results for input(s): HGBA1C in the last 72 hours. Lipid Profile No results for input(s): CHOL, HDL, LDLCALC, TRIG, CHOLHDL, LDLDIRECT in the last 72 hours. Thyroid function studies  Recent Labs  08/01/16 1448  TSH 1.993   Anemia work up  Recent Labs  08/01/16 1448  VITAMINB12 249   Urinalysis    Component Value Date/Time   COLORURINE AMBER (A) 08/01/2016 1747   APPEARANCEUR CLOUDY (A) 08/01/2016 1747   LABSPEC 1.017 08/01/2016 1747   PHURINE 5.0 08/01/2016 1747   GLUCOSEU NEGATIVE 08/01/2016 1747   HGBUR SMALL (A) 08/01/2016 1747   BILIRUBINUR NEGATIVE 08/01/2016 1747   BILIRUBINUR n 08/12/2012 1139   KETONESUR NEGATIVE 08/01/2016 1747   PROTEINUR 100 (A) 08/01/2016 1747   UROBILINOGEN 0.2 07/27/2014 0930   NITRITE NEGATIVE 08/01/2016 1747   LEUKOCYTESUR LARGE (A) 08/01/2016 1747   Sepsis Labs Invalid input(s): PROCALCITONIN,  WBC,  LACTICIDVEN Microbiology No results found for this or any previous visit (from the past 240 hour(s)).   Time coordinating discharge: Over 30 minutes  SIGNED:   Elmarie Shiley, MD  [facility] 08/02/2016, 9:08 AM Pager [phone]  If 7PM-7AM, please contact night-coverage [email or web address] Password [email or web address]

## 2016-08-02 NOTE — Progress Notes (Signed)
Subjective:    Patient reports pain in back and left leg.  Tolerating PO's. Denies SOB, CP, and calf pain. Husband at bedside.  Objective: Vital signs in last 24 hours: Temp:  [97.6 F (36.4 C)-98.3 F (36.8 C)] 97.6 F (36.4 C) (03/18 0554) Pulse Rate:  [74-81] 81 (03/18 0554) Resp:  [16-18] 16 (03/18 0554) BP: (145-156)/(54-79) 156/79 (03/18 0554) SpO2:  [90 %-98 %] 98 % (03/18 0554)  Intake/Output from previous day: 03/17 0701 - 03/18 0700 In: 780 [P.O.:780] Out: 300 [Urine:300] Intake/Output this shift: No intake/output data recorded.   Recent Labs  07/31/16 1932 08/01/16 0800  HGB 13.9 12.8    Recent Labs  07/31/16 1932 08/01/16 0800  WBC 12.5* 12.7*  RBC 4.61 4.40  HCT 41.6 39.3  PLT 273 281    Recent Labs  07/31/16 1932 08/01/16 0800  NA 137 139  K 3.0* 3.0*  CL 101 102  CO2 26 28  BUN 9 14  CREATININE 0.51 0.69  GLUCOSE 180* 167*  CALCIUM 8.8* 8.8*    Recent Labs  08/01/16 0800  INR 1.12   Alert and oriented x3. RRR, Lungs clear, BS x4. Left thigh tender. Soft Dressing inplace Left Calf soft and non tender.Marland Kitchen No DVT signs. No signs of infection or compartment syndrome. LLE grossly neurovascularly intact. Plantar and dorsi flexion intact.     Assessment/Plan:   Left distal femur fracture: Knee immobilizer at all times ICE to affected area NWB to Left leg F/u OP for back pain recommendations with DR.Gardiner Sleeper to D/c to SNF when ready Questions encouraged and answered in detail.  STILWELL, BRYSON L 08/02/2016, 9:20 AM

## 2016-08-02 NOTE — Progress Notes (Signed)
CSW spoke with pt and pt spouse at bedside concerning SNF options.  [facility] can accept pt with LOG if they cannot get auth tomorrow from [facility].  CSW still awaiting PASAR- under manual review- pt can NOT dc to SNF until PASAR number is received  CSW will continue to follow  Jorge Ny, [place] Social Worker [phone]

## 2016-08-02 NOTE — Evaluation (Signed)
Physical Therapy Evaluation Patient Details Name: [name] MRN: [id number] DOB: 10/09/1940 Today's Date: 08/02/2016   History of Present Illness  Pt admitted after fall resulting in L periprosthetic fx. Pt to be treated conservatively with KI and NWB.  PMH: DM, HTN, BrCa, chronic back pain.  Clinical Impression  Pt admitted as above and presenting with functional mobility limitations 2* premorbid deconditioning, NWB status on L LE, and obesity.  Pt would benefit from follow up rehab at SNF level to maximize safety and IND.    Follow Up Recommendations SNF    Equipment Recommendations  None recommended by PT    Recommendations for Other Services OT consult     Precautions / Restrictions Precautions Precautions: Fall Required Braces or Orthoses: Knee Immobilizer - Left Knee Immobilizer - Left: On at all times Restrictions Weight Bearing Restrictions: Yes LLE Weight Bearing: Non weight bearing      Mobility  Bed Mobility Overal bed mobility: Needs Assistance Bed Mobility: Supine to Sit;Sit to Supine     Supine to sit: +2 for physical assistance;Max assist Sit to supine: +2 for physical assistance;Max assist   General bed mobility comments: pivoted with bed pad, assist for LEs and trunk  Transfers Overall transfer level: Needs assistance Equipment used: Rolling walker (2 wheeled) Transfers: Sit to/from Stand Sit to Stand: +2 physical assistance;Mod assist;From elevated surface         General transfer comment: assist to rise, steady and maintain NWB on L LE from EOB  Ambulation/Gait             General Gait Details: Pt stood at bedside only, unable to advance R LE and maintain NWB on L LE  Stairs            Wheelchair Mobility    Modified Rankin (Stroke Patients Only)       Balance Overall balance assessment: Needs assistance   Sitting balance-Leahy Scale: Fair       Standing balance-Leahy Scale: Poor                               Pertinent Vitals/Pain Pain Assessment: Faces Faces Pain Scale: Hurts a little bit Pain Location: L knee    Home Living Family/patient expects to be discharged to:: Skilled nursing facility Living Arrangements: Spouse/significant other Available Help at Discharge: Family Type of Home: House Home Access: Stairs to enter;Ramped entrance   Entrance Stairs-Number of Steps: 1+1 with ramp between Home Layout: One level Home Equipment: Walker - 2 wheels;Bedside commode;Shower seat      Prior Function Level of Independence: Needs assistance   Gait / Transfers Assistance Needed: ambulated with RW  ADL's / Homemaking Assistance Needed: independent in self care, husband responsible for housekeeping and cooking        Hand Dominance   Dominant Hand: Right    Extremity/Trunk Assessment   Upper Extremity Assessment Upper Extremity Assessment: Generalized weakness    Lower Extremity Assessment Lower Extremity Assessment: Generalized weakness;LLE deficits/detail LLE Deficits / Details: KI in place.  No ROM at knee per PA    Cervical / Trunk Assessment Cervical / Trunk Assessment: Kyphotic  Communication   Communication: HOH  Cognition Arousal/Alertness: Awake/alert Behavior During Therapy: Anxious Overall Cognitive Status: No family/caregiver present to determine baseline cognitive functioning Area of Impairment: Memory;Following commands;Problem solving     Memory: Decreased short-term memory Following Commands: Follows one step commands with increased time     Problem  Solving: Slow processing;Decreased initiation;Difficulty sequencing;Requires verbal cues;Requires tactile cues      General Comments      Exercises     Assessment/Plan    PT Assessment Patient needs continued PT services  PT Problem List Decreased strength;Decreased range of motion;Decreased activity tolerance;Decreased mobility;Decreased knowledge of use of DME;Pain;Obesity;Decreased  cognition       PT Treatment Interventions DME instruction;Gait training;Functional mobility training;Therapeutic activities;Therapeutic exercise;Balance training;Patient/family education;Cognitive remediation    PT Goals (Current goals can be found in the Care Plan section)  Acute Rehab PT Goals Patient Stated Goal: to fly to [facility] in May PT Goal Formulation: With patient Time For Goal Achievement: 08/08/16 Potential to Achieve Goals: Fair    Frequency Min 3X/week   Barriers to discharge        Co-evaluation   Reason for Co-Treatment: For patient/therapist safety   OT goals addressed during session: ADL's and self-care       End of Session Equipment Utilized During Treatment: Gait belt;Left knee immobilizer Activity Tolerance: Patient tolerated treatment well Patient left: in bed;with call bell/phone within reach Nurse Communication: Mobility status PT Visit Diagnosis: Difficulty in walking, not elsewhere classified (R26.2)         Time: 2563-8937 PT Time Calculation (min) (ACUTE ONLY): 45 min   Charges:   PT Evaluation $PT Eval Moderate Complexity: 1 Procedure     PT G Codes:         Hillis Mcphatter 08/02/2016, 1:06 PM

## 2016-08-03 LAB — GLUCOSE, CAPILLARY
GLUCOSE-CAPILLARY: 216 mg/dL — AB (ref 65–99)
GLUCOSE-CAPILLARY: 154 mg/dL — AB (ref 65–99)
Glucose-Capillary: 161 mg/dL — ABNORMAL HIGH (ref 65–99)

## 2016-08-03 LAB — HEMOGLOBIN A1C
HEMOGLOBIN A1C: 7.9 % — AB (ref 4.8–5.6)
Mean Plasma Glucose: 180 mg/dL

## 2016-08-03 NOTE — Clinical Social Work Placement (Signed)
   CLINICAL SOCIAL WORK PLACEMENT  NOTE  Date:  08/03/2016  Patient Details  Name: [name] MRN: [id number] Date of Birth: September 03, 1940  Clinical Social Work is seeking post-discharge placement for this patient at the [place] level of care (*CSW will initial, date and re-position this form in  chart as items are completed):  Yes   Patient/family provided with [place] Work Department's list of facilities offering this level of care within the geographic area requested by the patient (or if unable, by the patient's family).  Yes   Patient/family informed of their freedom to choose among providers that offer the needed level of care, that participate in Medicare, Medicaid or managed care program needed by the patient, have an available bed and are willing to accept the patient.  Yes   Patient/family informed of [place]'s ownership interest in [facility] and [facility], as well as of the fact that they are under no obligation to receive care at these facilities.  PASRR submitted to EDS on 08/01/16     PASRR number received on       Existing PASRR number confirmed on       FL2 transmitted to all facilities in geographic area requested by pt/family on 08/01/16     FL2 transmitted to all facilities within larger geographic area on       Patient informed that his/her managed care company has contracts with or will negotiate with certain facilities, including the following:   (Has [facility] Medicare- will require prior authorization for placement vs 5 day LOG until auth can be obtained.)     Yes   Patient/family informed of bed offers received.  Patient chooses bed at [facility]     Physician recommends and patient chooses bed at      Patient to be transferred to [facility] on 08/03/16.  Patient to be transferred to facility by Ambulance [facility])     Patient family notified on 08/03/16 of  transfer.  Name of family member notified:  SPOUSE     PHYSICIAN Please prepare priority discharge summary, including medications, Please prepare prescriptions, Please sign FL2     Additional Comment: Pt / spouse are in agreement with d/c to [facility] today. [facility] transport needed. Medical necessity form completed. Pt / spouse are aware out of pocket costs may be associated with [facility] transport. [facility] has provided 5 day LOG while waiting for [facility] authorization. Pasrr # received today. D/C Summary sent to SNF for review. Scripts included in d/c packet. # for report provided to nsg.   _______________________________________________ Luretha Rued, LCSW 08/03/2016, 3:20 PM

## 2016-08-03 NOTE — Progress Notes (Addendum)
[facility] here to pick up patient to transfer to [facility]. After transferring her from bed to gurney, patient started with expiratory wheezing and was a little tachy. No distress noted. This was not apparent during her hospital stay. She is not on any inhalers or nebulizer treatments. [facility] will not transport her if she is wheezing. Facility would just transport her back to the ED. I have paged Dr. Baltazar Najjar and Dr. Alcario Drought with no response at this time. PTAR is putting patient back to bed at leaving without patient at this time.

## 2016-08-03 NOTE — Discharge Summary (Signed)
Physician Discharge Summary  [name] HYQ:[id number] DOB: 11-Dec-1940 DOA: 07/31/2016  PCP: [name]., DO  Admit date: 07/31/2016 Discharge date: 08/03/2016  Admitted From: Home  Disposition: SNF  Recommendations for Outpatient Follow-up:  Follow up with PCP in 1-2 weeks Please obtain BMP/CBC in one week Please follow up on the following pending results: follow urine culture.  Follow up with Dr Nelva Bush for further care of Back pain and  Knee immobilizer at all times ICE to affected area NWB to Left leg    Discharge Condition: stable.  CODE STATUS: Full code.  Diet recommendation: Carb modified diet   Brief/Interim Summary: [name] a 76 y.o.femalewith a past medical history significant for IDDM, HTN, BrCa on Arimidexwho presents with fall.  Caveat that patient is sedated from IV opioid and unable to provider her own history. Per report, she has been unable to sleep for the past few days due to chronic back and diffuse body pain. She notes this morning while she was standing she was trying to fall asleep which resulted in her falling and landing on her left knee. Denies head injury or LOC. Patient reports she has continued to have pain to her left knee throughout the day and has been unable to ambulate resulting her coming to the ED. Patient states she uses a cane/walkertypically only when going outside of her house. Patient denies any syncopal episode or symptoms prior to fall. She currently denies headache, visual changes, lightheadedness, dizziness, neck/back pain, chest pain, abdominal pain, shortness of breath, palpitations, urinary symptoms, numbness, tingling, weakness. Patient denies taking any of her home medications today. Denies taking any pain meds prior to arrival. Denies use of anticoagulants.    Assessment & Plan:   Principal Problem:   Fracture of knee prosthesis (HCC) Active Problems:   Obstructive sleep apnea   Essential hypertension   Type  2 diabetes mellitus with diabetic neuropathy (HCC)   Chronic diastolic congestive heart failure (HCC)   Chronic pain syndrome     Left distal femur fracture, Fracture of knee prosthesis: Posterior distal femur fracture at the bone prosthesis interface. -Per Orthopedics No plan fior surgery.  Need knee immobilizer.  PT NWB on the L LE with a knee immobilizer all time Pain management with Oxycodone.   AMS; resolved.  Patient sleep a lot at home after she has period of worsening back pain./  No significant hypercapnia on ABG, CT head negative. Patient alert and oriented.   UTI;  Received two  doses of ceftriaxone.  Discharge on keflex for 3 days.  Follow urine culture. pending  CPAP at HS.  Careful with sedatives.    Back pain: This is evidently from DDD (per family, they report seeing someone from [facility] ortho recently, MRI results are not available to me). Likely this will be a more significant source of discomfort for the patient than her knee. Follow with Dr Nelva Bush.  I Asked Orhto to evaluate and reviewed MRI done at Dr Nelva Bush office. .   Hypertension: -Continue home carvedilol dose increased to 6.25 BID -Continue lisinopril -Hydralazine IV PRN for severe range pressures  Insulin dependent diabetes: resume lantus at discharge.  -SSI with meals -Check HgbA1c  Hypokalemia: -Replace K replace mg  Chronic pain: -Continue Robaxin, careful with sedation , new Cymbalta -Acetaminophen  Other medications: -Continue anastrozole -Continue pramipexole    Discharge Diagnoses:  Principal Problem:   Fracture of knee prosthesis ([place]) Active Problems:   Obstructive sleep apnea   Essential hypertension  Type 2 diabetes mellitus with diabetic neuropathy (HCC)   Chronic diastolic congestive heart failure (HCC)   Chronic pain syndrome   Uncontrolled pain    Discharge Instructions  Discharge Instructions    Diet - low sodium heart healthy     Complete by:  As directed    Diet - low sodium heart healthy    Complete by:  As directed    Increase activity slowly    Complete by:  As directed    Increase activity slowly    Complete by:  As directed    Non weight bearing    Complete by:  As directed    Laterality:  left   Extremity:  Lower     Allergies as of 08/03/2016      Reactions   Sulfonamide Derivatives Swelling   Lips Swelling   Other    BLOOD PRODUCT REFUSAL      Medication List    STOP taking these medications   Acetaminophen-Codeine 300-30 MG tablet   diphenhydramine-acetaminophen 25-500 MG Tabs tablet Commonly known as:  TYLENOL PM   HYDROcodone-acetaminophen 7.5-325 MG tablet Commonly known as:  NORCO   potassium chloride SA 20 MEQ tablet Commonly known as:  K-DUR,KLOR-CON     TAKE these medications   anastrozole 1 MG tablet Commonly known as:  ARIMIDEX TAKE ONE TABLET BY MOUTH ONCE DAILY   carvedilol 3.125 MG tablet Commonly known as:  COREG Take 1 tablet (3.125 mg total) by mouth 2 (two) times daily with a meal.   cephALEXin 500 MG capsule Commonly known as:  KEFLEX Take 1 capsule (500 mg total) by mouth 3 (three) times daily.   cholecalciferol 1000 units tablet Commonly known as:  VITAMIN D Take 1,000 Units by mouth 2 (two) times daily.   cyanocobalamin 100 MCG tablet Take 1 tablet (100 mcg total) by mouth daily.   DULoxetine 20 MG capsule Commonly known as:  CYMBALTA Take 1 capsule (20 mg total) by mouth daily. What changed:  when to take this  reasons to take this  additional instructions   furosemide 40 MG tablet Commonly known as:  LASIX Take 1 tablet (40 mg total) by mouth daily.   Insulin Glargine 100 UNIT/ML Solostar Pen Commonly known as:  LANTUS SOLOSTAR Inject 16 Units into the skin daily at 10 pm.   Insulin Pen Needle 32G X 4 MM Misc Commonly known as:  BD PEN NEEDLE NANO U/F Use as directed once a day   lisinopril 20 MG tablet Commonly known as:   PRINIVIL,ZESTRIL Take 1 tablet (20 mg total) by mouth daily.   metFORMIN 1000 MG tablet Commonly known as:  GLUCOPHAGE Take 1 tablet (1,000 mg total) by mouth 2 (two) times daily.   methocarbamol 500 MG tablet Commonly known as:  ROBAXIN Take 500 mg by mouth every 6 (six) hours as needed for muscle spasms. Reported on 09/25/2015   oxyCODONE 5 MG immediate release tablet Commonly known as:  Oxy IR/ROXICODONE Take 1 tablet (5 mg total) by mouth every 6 (six) hours as needed for moderate pain.   polyethylene glycol packet Commonly known as:  MIRALAX / GLYCOLAX Take 17 g by mouth daily as needed for mild constipation.   potassium chloride 10 MEQ tablet Commonly known as:  K-DUR Take 1 tablet (10 mEq total) by mouth daily.   pramipexole 1 MG tablet Commonly known as:  MIRAPEX TAKE ONE TABLET BY MOUTH TWICE DAILY   VITAMIN B-6 PO Take by mouth.  Contact information for after-discharge care    [facility] Follow up.   Specialty:  [place] information: [place] [phone]             Allergies  Allergen Reactions  . Sulfonamide Derivatives Swelling    Lips Swelling  . Other     BLOOD PRODUCT REFUSAL    Consultations:  Ortho    Procedures/Studies: Ct Head Wo Contrast  Result Date: 08/01/2016 CLINICAL DATA:  76 year old female with altered mental status. Status post left knee surgery today. EXAM: CT HEAD WITHOUT CONTRAST TECHNIQUE: Contiguous axial images were obtained from the base of the skull through the vertex without intravenous contrast. COMPARISON:  Head CT dated 02/11/2016 FINDINGS: Brain: The ventricles and sulci appropriate size for patient's age. Moderate to advanced periventricular and deep white matter chronic microvascular ischemic changes noted. There is no acute intracranial hemorrhage. No mass effect or midline shift noted. No extra-axial  fluid collection. Vascular: No hyperdense vessel or unexpected calcification. Skull: Normal. Negative for fracture or focal lesion. Sinuses/Orbits: No acute finding. Other: None. IMPRESSION: 1. No acute intracranial hemorrhage. 2. Moderate chronic microvascular ischemic changes. Electronically Signed   By: Anner Crete M.D.   On: 08/01/2016 18:49   Ct Knee Left Wo Contrast  Result Date: 08/01/2016 CLINICAL DATA:  76 year old female with left knee arthroplasty. Evaluate for fracture. EXAM: CT OF THE left KNEE WITHOUT CONTRAST TECHNIQUE: Multidetector CT imaging of the left knee was performed according to the standard protocol. Multiplanar CT image reconstructions were also generated. COMPARISON:  Left knee radiograph dated 07/31/2016 FINDINGS: Evaluation of this exam is very limited due to streak artifact caused by metallic knee arthroplasty. Bones/Joint/Cartilage There is a total left knee arthroplasty. The arthroplasty components appear intact and in anatomic alignment. No evidence of hardware loosening noted. There is faint linear lucency in the medial aspect of the proximal tibial ( series 7, image 53) which may be artifactual or represent a nondisplaced fracture. No the a other acute fracture identified. There is heterotopic bone formation of the posterior femoral metaphysis at the arthroplasty interface. There is marrow heterogeneity of the distal femur. There is no dislocation. There is moderate amount of suprapatellar effusion. Correlation with clinical exam is recommended to evaluate for possibility of infection. Ligaments Suboptimally assessed by CT. Muscles and Tendons No intramuscular hematoma.  No definite acute tendon injury. Soft tissues There is mild subcutaneous soft tissue edema of the anterior knee. IMPRESSION: Very limited evaluation for fracture due to advanced osteopenia and streak artifact caused by metallic knee arthroplasty. There is an area of cortical irregularity versus less likely  nondisplaced cortical fracture involving the medial tibial metaphysis. No other acute fracture identified. There is. Ectopic bone formation posterior to the distal femoral metaphysis at the interface with the arthroplasty. Large suprapatellar effusion with mild edema of the subcutaneous soft tissues of the anterior knee. Correlation with clinical exam is recommended to evaluate for possible infection. Electronically Signed   By: Anner Crete M.D.   On: 08/01/2016 00:08   Portable Chest 1 View  Result Date: 08/01/2016 CLINICAL DATA:  Preoperative study.  Right breast cancer. EXAM: PORTABLE CHEST 1 VIEW COMPARISON:  July 02, 2015 FINDINGS: No pneumothorax. Elevated left hemidiaphragm with atelectasis, unchanged. Unchanged cardiomediastinal silhouette. No overt edema. IMPRESSION: No interval change or acute abnormality. Elevated left hemidiaphragm with atelectasis, stable in the interval. Electronically Signed   By: Dorise Bullion III M.D  On: 08/01/2016 08:03   Dg Knee Complete 4 Views Left  Result Date: 07/31/2016 CLINICAL DATA:  76 y/o  F; status post fall with pain. EXAM: LEFT KNEE - COMPLETE 4+ VIEW COMPARISON:  02/07/2014 knee radiographs. FINDINGS: Posterior distal femur fracture at the bone prosthesis interface. Total knee arthroplasty and patellar resurfacing with prosthesis. Calcific bodies within the joint space. No addition periprosthetic lucency. IMPRESSION: Posterior distal femur fracture at the bone prosthesis interface. Electronically Signed   By: Kristine Garbe M.D.   On: 07/31/2016 21:19   Dg Hip Unilat With Pelvis 2-3 Views Left  Result Date: 07/31/2016 CLINICAL DATA:  Pt states she was standing and fell asleep and fell down this morning at her home on carpet. Pt has a hx of falling and has restless leg syndrome. Best obtainable EXAM: DG HIP (WITH OR WITHOUT PELVIS) 2-3V LEFT COMPARISON:  None. FINDINGS: Hips are located. No evidence of pelvic fracture or sacral  fracture. Dedicated view of the LEFT hip demonstrates no femoral neck fracture. There is a ring of osteophytes which marks over the femoral neck. No clear evidence fracture. IMPRESSION: No clear evidence of LEFT femur fracture. Ring of osteophytes extends over the femoral neck. If there is continue clinical suspicion for occult hip fracture or the patient refuses to weightbear, consider further evaluation with MRI or CT. Electronically Signed   By: Suzy Bouchard M.D.   On: 07/31/2016 21:19      Subjective: She is alert today. Her hearing aids needs batteries.  She has not had a BM.  Pain medications are helping with pain. She is tired of having pain. I epxplain that ortho will be by to speak about MRI of her back done at Dr Nelva Bush offices.  She denies suicidal thought    Discharge Exam: Vitals:   08/03/16 0617 08/03/16 0746  BP: (!) 203/87 (!) 189/81  Pulse: 74   Resp: 16   Temp: 98.9 F (37.2 C)    Vitals:   08/02/16 2300 08/03/16 0617 08/03/16 0645 08/03/16 0746  BP: (!) 121/44 (!) 203/87  (!) 189/81  Pulse: 75 74    Resp: 16 16    Temp: 98.4 F (36.9 C) 98.9 F (37.2 C)    TempSrc: Oral Oral    SpO2: 96% (!) 89% 93%   Weight:      Height:        General: Pt is alert, awake, not in acute distress Cardiovascular: RRR, S1/S2 +, no rubs, no gallops Respiratory: CTA bilaterally, no wheezing, no rhonchi Abdominal: Soft, NT, ND, bowel sounds + Extremities: no edema, no cyanosis, left knee with immobilizer.     The results of significant diagnostics from this hospitalization (including imaging, microbiology, ancillary and laboratory) are listed below for reference.     Microbiology: Recent Results (from the past 240 hour(s))  Urine culture     Status: Abnormal (Preliminary result)   Collection Time: 08/01/16  5:47 PM  Result Value Ref Range Status   Specimen Description URINE, RANDOM  Final   Special Requests NONE  Final   Culture >=100,000 COLONIES/mL GRAM NEGATIVE  RODS (A)  Final   Report Status PENDING  Incomplete     Labs: BNP (last 3 results) No results for input(s): BNP in the last 8760 hours. Basic Metabolic Panel:  Recent Labs Lab 07/31/16 1932 08/01/16 0800  NA 137 139  K 3.0* 3.0*  CL 101 102  CO2 26 28  GLUCOSE 180* 167*  BUN 9 14  CREATININE  0.51 0.69  CALCIUM 8.8* 8.8*  MG  --  1.7   Liver Function Tests: No results for input(s): AST, ALT, ALKPHOS, BILITOT, PROT, ALBUMIN in the last 168 hours. No results for input(s): LIPASE, AMYLASE in the last 168 hours.  Recent Labs Lab 08/01/16 1448  AMMONIA 16   CBC:  Recent Labs Lab 07/31/16 1932 08/01/16 0800  WBC 12.5* 12.7*  NEUTROABS 9.9*  --   HGB 13.9 12.8  HCT 41.6 39.3  MCV 90.2 89.3  PLT 273 281   Cardiac Enzymes: No results for input(s): CKTOTAL, CKMB, CKMBINDEX, TROPONINI in the last 168 hours. BNP: Invalid input(s): POCBNP CBG:  Recent Labs Lab 08/02/16 0801 08/02/16 1153 08/02/16 1706 08/02/16 2350 08/03/16 0752  GLUCAP 170* 200* 177* 194* 161*   D-Dimer No results for input(s): DDIMER in the last 72 hours. Hgb A1c  Recent Labs  08/01/16 0800  HGBA1C 7.9*   Lipid Profile No results for input(s): CHOL, HDL, LDLCALC, TRIG, CHOLHDL, LDLDIRECT in the last 72 hours. Thyroid function studies  Recent Labs  08/01/16 1448  TSH 1.993   Anemia work up  Recent Labs  08/01/16 1448  VITAMINB12 249   Urinalysis    Component Value Date/Time   COLORURINE AMBER (A) 08/01/2016 1747   APPEARANCEUR CLOUDY (A) 08/01/2016 1747   LABSPEC 1.017 08/01/2016 1747   PHURINE 5.0 08/01/2016 1747   GLUCOSEU NEGATIVE 08/01/2016 1747   HGBUR SMALL (A) 08/01/2016 1747   BILIRUBINUR NEGATIVE 08/01/2016 1747   BILIRUBINUR n 08/12/2012 1139   KETONESUR NEGATIVE 08/01/2016 1747   PROTEINUR 100 (A) 08/01/2016 1747   UROBILINOGEN 0.2 07/27/2014 0930   NITRITE NEGATIVE 08/01/2016 1747   LEUKOCYTESUR LARGE (A) 08/01/2016 1747   Sepsis Labs Invalid  input(s): PROCALCITONIN,  WBC,  LACTICIDVEN Microbiology Recent Results (from the past 240 hour(s))  Urine culture     Status: Abnormal (Preliminary result)   Collection Time: 08/01/16  5:47 PM  Result Value Ref Range Status   Specimen Description URINE, RANDOM  Final   Special Requests NONE  Final   Culture >=100,000 COLONIES/mL GRAM NEGATIVE RODS (A)  Final   Report Status PENDING  Incomplete     Time coordinating discharge: Over 30 minutes  SIGNED:   Elmarie Shiley, MD  [facility] 08/03/2016, 10:23 AM Pager [phone]  If 7PM-7AM, please contact night-coverage [email or web address] Password [email or web address]

## 2016-08-04 ENCOUNTER — Inpatient Hospital Stay (HOSPITAL_COMMUNITY): Payer: Medicare HMO

## 2016-08-04 DIAGNOSIS — R0602 Shortness of breath: Secondary | ICD-10-CM | POA: Diagnosis not present

## 2016-08-04 DIAGNOSIS — E1140 Type 2 diabetes mellitus with diabetic neuropathy, unspecified: Secondary | ICD-10-CM | POA: Diagnosis not present

## 2016-08-04 DIAGNOSIS — N390 Urinary tract infection, site not specified: Secondary | ICD-10-CM | POA: Diagnosis not present

## 2016-08-04 DIAGNOSIS — I5032 Chronic diastolic (congestive) heart failure: Secondary | ICD-10-CM | POA: Diagnosis not present

## 2016-08-04 DIAGNOSIS — G4733 Obstructive sleep apnea (adult) (pediatric): Secondary | ICD-10-CM | POA: Diagnosis not present

## 2016-08-04 DIAGNOSIS — M9711XA Periprosthetic fracture around internal prosthetic right knee joint, initial encounter: Secondary | ICD-10-CM | POA: Diagnosis not present

## 2016-08-04 DIAGNOSIS — R21 Rash and other nonspecific skin eruption: Secondary | ICD-10-CM | POA: Diagnosis not present

## 2016-08-04 DIAGNOSIS — T84019A Broken internal joint prosthesis, unspecified site, initial encounter: Secondary | ICD-10-CM | POA: Diagnosis not present

## 2016-08-04 DIAGNOSIS — Z96652 Presence of left artificial knee joint: Secondary | ICD-10-CM | POA: Diagnosis not present

## 2016-08-04 DIAGNOSIS — C50411 Malignant neoplasm of upper-outer quadrant of right female breast: Secondary | ICD-10-CM | POA: Diagnosis not present

## 2016-08-04 DIAGNOSIS — S62609A Fracture of unspecified phalanx of unspecified finger, initial encounter for closed fracture: Secondary | ICD-10-CM | POA: Diagnosis not present

## 2016-08-04 DIAGNOSIS — I10 Essential (primary) hypertension: Secondary | ICD-10-CM | POA: Diagnosis not present

## 2016-08-04 DIAGNOSIS — G8911 Acute pain due to trauma: Secondary | ICD-10-CM | POA: Diagnosis not present

## 2016-08-04 DIAGNOSIS — G4730 Sleep apnea, unspecified: Secondary | ICD-10-CM | POA: Diagnosis not present

## 2016-08-04 DIAGNOSIS — M1990 Unspecified osteoarthritis, unspecified site: Secondary | ICD-10-CM | POA: Diagnosis not present

## 2016-08-04 DIAGNOSIS — R41841 Cognitive communication deficit: Secondary | ICD-10-CM | POA: Diagnosis not present

## 2016-08-04 DIAGNOSIS — R278 Other lack of coordination: Secondary | ICD-10-CM | POA: Diagnosis not present

## 2016-08-04 DIAGNOSIS — S72492A Other fracture of lower end of left femur, initial encounter for closed fracture: Secondary | ICD-10-CM | POA: Diagnosis not present

## 2016-08-04 DIAGNOSIS — Z471 Aftercare following joint replacement surgery: Secondary | ICD-10-CM | POA: Diagnosis not present

## 2016-08-04 DIAGNOSIS — G8929 Other chronic pain: Secondary | ICD-10-CM | POA: Diagnosis not present

## 2016-08-04 DIAGNOSIS — F329 Major depressive disorder, single episode, unspecified: Secondary | ICD-10-CM | POA: Diagnosis not present

## 2016-08-04 DIAGNOSIS — G2581 Restless legs syndrome: Secondary | ICD-10-CM | POA: Diagnosis not present

## 2016-08-04 DIAGNOSIS — S8990XA Unspecified injury of unspecified lower leg, initial encounter: Secondary | ICD-10-CM | POA: Diagnosis not present

## 2016-08-04 DIAGNOSIS — E119 Type 2 diabetes mellitus without complications: Secondary | ICD-10-CM | POA: Diagnosis not present

## 2016-08-04 DIAGNOSIS — M9712XD Periprosthetic fracture around internal prosthetic left knee joint, subsequent encounter: Secondary | ICD-10-CM | POA: Diagnosis not present

## 2016-08-04 DIAGNOSIS — R2689 Other abnormalities of gait and mobility: Secondary | ICD-10-CM | POA: Diagnosis not present

## 2016-08-04 LAB — BASIC METABOLIC PANEL
Anion gap: 6 (ref 5–15)
BUN: 18 mg/dL (ref 6–20)
CHLORIDE: 97 mmol/L — AB (ref 101–111)
CO2: 30 mmol/L (ref 22–32)
CREATININE: 0.51 mg/dL (ref 0.44–1.00)
Calcium: 8.7 mg/dL — ABNORMAL LOW (ref 8.9–10.3)
GFR calc non Af Amer: >60 mL/min (ref >60)
Glucose, Bld: 199 mg/dL — ABNORMAL HIGH (ref 65–99)
POTASSIUM: 4.1 mmol/L (ref 3.5–5.1)
SODIUM: 133 mmol/L — AB (ref 135–145)

## 2016-08-04 LAB — URINE CULTURE: Culture: >=100000 — AB

## 2016-08-04 LAB — GLUCOSE, CAPILLARY
GLUCOSE-CAPILLARY: 205 mg/dL — AB (ref 65–99)
Glucose-Capillary: 155 mg/dL — ABNORMAL HIGH (ref 65–99)

## 2016-08-04 MED ORDER — ALBUTEROL SULFATE (2.5 MG/3ML) 0.083% IN NEBU: 2.5000 mg | INHALATION_SOLUTION | Freq: Four times a day (QID) | RESPIRATORY_TRACT | Status: DC | PRN

## 2016-08-04 MED ORDER — AMLODIPINE BESYLATE 5 MG PO TABS
5.0000 mg | ORAL_TABLET | Freq: Every day | ORAL | Status: DC
  Administered 2016-08-04: 5 mg via ORAL
  Filled 2016-08-04: qty 1

## 2016-08-04 MED ORDER — AMLODIPINE BESYLATE 5 MG PO TABS: 5.0000 mg | ORAL_TABLET | Freq: Every day | ORAL | 0 refills | Status: DC

## 2016-08-04 MED ORDER — POTASSIUM CHLORIDE CRYS ER 20 MEQ PO TBCR: 40.0000 meq | EXTENDED_RELEASE_TABLET | Freq: Once | ORAL | Status: DC

## 2016-08-04 MED ORDER — ALBUTEROL SULFATE (2.5 MG/3ML) 0.083% IN NEBU: 2.5000 mg | INHALATION_SOLUTION | Freq: Four times a day (QID) | RESPIRATORY_TRACT | 12 refills | Status: DC | PRN

## 2016-08-04 NOTE — Progress Notes (Signed)
Pt d/c to [facility] today. Pt/spouse in agreement with plan. PTAR transport required. Medical necessity form updated. Updated D/C Summary sent to SNF for review. # for report provided to nsg. Scripts remain in [employer].  Werner Lean LCSW [phone]

## 2016-08-04 NOTE — Progress Notes (Signed)
Paged Tylene Fantasia, [facility] Floor coverage, NP at [phone] to report PT[facility] inability to transfer pt to [facility] as anticipated d/t audible expiratory wheezing. She later put in order to move pt discharge to 3/20. No further orders received at this time.

## 2016-08-04 NOTE — Progress Notes (Signed)
Tylene Fantasia, NP paged to notify of increased  BP. Pt resting comfortably in bed, denies pain. BP 183/85. Pt currently has no IV access and Hydralazine is currently ordered only for SBP >190.

## 2016-08-04 NOTE — Progress Notes (Signed)
Vanessa Fantasia, NP for floor coverage paged for continued elevated BP reading. Pt is asymptomatic, current BP reading is 182/77.

## 2016-08-04 NOTE — Progress Notes (Signed)
Physical Therapy Treatment Patient Details Name: [name] MRN: [id number] DOB: 11-May-1941 Today's Date: 08/04/2016    History of Present Illness Pt admitted after fall resulting in L periprosthetic fx. Pt to be treated conservatively with KI and NWB.  PMH: DM, HTN, BrCa, chronic back pain.    PT Comments    Pt AxO x 3 and pleasant.  Spouse in room.  Assisted with female urinal and hygiene.  Then performed side to side bed mobility followed by a few LE TE's with KI on to ensure NO knee ROM.  Positioned to comfort and applied ICE to L knee.   Follow Up Recommendations  SNF     Equipment Recommendations  None recommended by PT    Recommendations for Other Services       Precautions / Restrictions Precautions Precautions: Fall Required Braces or Orthoses: Knee Immobilizer - Left Knee Immobilizer - Left: On at all times Restrictions Weight Bearing Restrictions: Yes LLE Weight Bearing: Non weight bearing    Mobility  Bed Mobility Overal bed mobility: Needs Assistance Bed Mobility: Rolling Rolling: Min assist;Mod assist         General bed mobility comments: Min Assist to roll to Left using rails.  Mod Assist to roll to R with assist for L LE .    Transfers                    Ambulation/Gait                 Stairs            Wheelchair Mobility    Modified Rankin (Stroke Patients Only)       Balance                                    Cognition Arousal/Alertness: Awake/alert Behavior During Therapy: WFL for tasks assessed/performed Overall Cognitive Status: Within Functional Limits for tasks assessed                 General Comments: conversive and pleasant    Exercises  10 reps AP, knee presses, Hip Abd/ADd AAROM and hip flex AAROM L LE.     General Comments        Pertinent Vitals/Pain Faces Pain Scale: Hurts a little bit Pain Location: L knee    Home Living                      Prior  Function            PT Goals (current goals can now be found in the care plan section) Progress towards PT goals: Progressing toward goals    Frequency    Min 3X/week      PT Plan Current plan remains appropriate    Co-evaluation             End of Session Equipment Utilized During Treatment: Oxygen;Left knee immobilizer Activity Tolerance: Patient tolerated treatment well Patient left: in bed;with call bell/phone within reach   PT Visit Diagnosis: Difficulty in walking, not elsewhere classified (R26.2)     Time: 6195-0932 PT Time Calculation (min) (ACUTE ONLY): 24 min  Charges:  $Therapeutic Exercise: 8-22 mins $Therapeutic Activity: 8-22 mins                    G Codes:       Mahalia Dykes  PTA [employer]  Acute  Rehab Pager      [phone]

## 2016-08-04 NOTE — Discharge Summary (Addendum)
Physician Discharge Summary  [name] BMW:[id number] DOB: [birth year] DOA: 07/31/2016  PCP: [name]., DO  Admit date: 07/31/2016 Discharge date: 08/04/2016  Admitted From: Home  Disposition: SNF  Recommendations for Outpatient Follow-up:  Follow up with PCP in 1-2 weeks Please obtain BMP/CBC in one week Please follow up on the following pending results: follow urine culture.  Patient will benefit of using CPAP Follow up with Dr Nelva Bush for further care of Back pain and  Knee immobilizer at all times ICE to affected area NWB to Left leg Discharge on 2 L oxygen,    Discharge Condition: stable.  CODE STATUS: Full code.  Diet recommendation: Carb modified diet   Brief/Interim Summary: [name] a 76 y.o.femalewith a past medical history significant for IDDM, HTN, BrCa on Arimidexwho presents with fall.  Caveat that patient is sedated from IV opioid and unable to provider her own history. Per report, she has been unable to sleep for the past few days due to chronic back and diffuse body pain. She notes this morning while she was standing she was trying to fall asleep which resulted in her falling and landing on her left knee. Denies head injury or LOC. Patient reports she has continued to have pain to her left knee throughout the day and has been unable to ambulate resulting her coming to the ED. Patient states she uses a cane/walkertypically only when going outside of her house. Patient denies any syncopal episode or symptoms prior to fall. She currently denies headache, visual changes, lightheadedness, dizziness, neck/back pain, chest pain, abdominal pain, shortness of breath, palpitations, urinary symptoms, numbness, tingling, weakness. Patient denies taking any of her home medications today. Denies taking any pain meds prior to arrival. Denies use of anticoagulants.    Assessment & Plan:   Principal Problem:   Fracture of knee prosthesis (HCC) Active  Problems:   Obstructive sleep apnea   Essential hypertension   Type 2 diabetes mellitus with diabetic neuropathy (HCC)   Chronic diastolic congestive heart failure (HCC)   Chronic pain syndrome     Left distal femur fracture, Fracture of knee prosthesis: Posterior distal femur fracture at the bone prosthesis interface. -Per Orthopedics No plan fior surgery.  Need knee immobilizer.  PT NWB on the L LE with a knee immobilizer all time Pain management with Oxycodone PRN.   AMS; resolved.  Patient sleep a lot at home after she has period of worsening back pain./  No significant hypercapnia on ABG, CT head negative. Patient alert and oriented.   UTI;  Received two  doses of ceftriaxone.  Discharge on keflex for 3 days.  Follow urine culture. pending  CPAP at HS.  Careful with sedatives.    Back pain: This is evidently from DDD (per family, they report seeing someone from [facility] ortho recently, MRI results are not available to me). Likely this will be a more significant source of discomfort for the patient than her knee. Follow with Dr Nelva Bush.  I Asked Orhto to evaluate and reviewed MRI done at Dr Nelva Bush office. .   Hypertension: -Continue home carvedilol dose increased to 6.25 BID -Continue lisinopril -Hydralazine IV PRN for severe range pressures -Add Norvasc.   Chronic Diastolic HF;  continue with lasix.  No wheezing n exam. Chest x ray negative for edema.  Patient to be discharge on 2 L oxygen.   Insulin dependent diabetes: resume lantus at discharge.  -SSI with meals -Check HgbA1c  Hypokalemia: -Replace K replace mg  Chronic pain: -Continue Robaxin, careful with sedation , new Cymbalta -Acetaminophen  Other medications: -Continue anastrozole -Continue pramipexole    Discharge Diagnoses:  Principal Problem:   Fracture of knee prosthesis ([place]) Active Problems:   Obstructive sleep apnea   Essential hypertension   Type 2 diabetes  mellitus with diabetic neuropathy (HCC)   Chronic diastolic congestive heart failure (HCC)   Chronic pain syndrome   Uncontrolled pain    Discharge Instructions  Discharge Instructions    Diet - low sodium heart healthy    Complete by:  As directed    Diet - low sodium heart healthy    Complete by:  As directed    Diet - low sodium heart healthy    Complete by:  As directed    Increase activity slowly    Complete by:  As directed    Increase activity slowly    Complete by:  As directed    Increase activity slowly    Complete by:  As directed    Non weight bearing    Complete by:  As directed    Laterality:  left   Extremity:  Lower     Allergies as of 08/04/2016      Reactions   Sulfonamide Derivatives Swelling   Lips Swelling   Other    BLOOD PRODUCT REFUSAL      Medication List    STOP taking these medications   Acetaminophen-Codeine 300-30 MG tablet   diphenhydramine-acetaminophen 25-500 MG Tabs tablet Commonly known as:  TYLENOL PM   HYDROcodone-acetaminophen 7.5-325 MG tablet Commonly known as:  NORCO   potassium chloride SA 20 MEQ tablet Commonly known as:  K-DUR,KLOR-CON     TAKE these medications   albuterol (2.5 MG/3ML) 0.083% nebulizer solution Commonly known as:  PROVENTIL Take 3 mLs (2.5 mg total) by nebulization every 6 (six) hours as needed for wheezing or shortness of breath.   amLODipine 5 MG tablet Commonly known as:  NORVASC Take 1 tablet (5 mg total) by mouth daily. Start taking on:  08/05/2016   anastrozole 1 MG tablet Commonly known as:  ARIMIDEX TAKE ONE TABLET BY MOUTH ONCE DAILY   carvedilol 3.125 MG tablet Commonly known as:  COREG Take 1 tablet (3.125 mg total) by mouth 2 (two) times daily with a meal.   cephALEXin 500 MG capsule Commonly known as:  KEFLEX Take 1 capsule (500 mg total) by mouth 3 (three) times daily.   cholecalciferol 1000 units tablet Commonly known as:  VITAMIN D Take 1,000 Units by mouth 2 (two)  times daily.   cyanocobalamin 100 MCG tablet Take 1 tablet (100 mcg total) by mouth daily.   DULoxetine 20 MG capsule Commonly known as:  CYMBALTA Take 1 capsule (20 mg total) by mouth daily. What changed:  when to take this  reasons to take this  additional instructions   furosemide 40 MG tablet Commonly known as:  LASIX Take 1 tablet (40 mg total) by mouth daily.   Insulin Glargine 100 UNIT/ML Solostar Pen Commonly known as:  LANTUS SOLOSTAR Inject 16 Units into the skin daily at 10 pm.   Insulin Pen Needle 32G X 4 MM Misc Commonly known as:  BD PEN NEEDLE NANO U/F Use as directed once a day   lisinopril 20 MG tablet Commonly known as:  PRINIVIL,ZESTRIL Take 1 tablet (20 mg total) by mouth daily.   metFORMIN 1000 MG tablet Commonly known as:  GLUCOPHAGE Take 1 tablet (1,000 mg total) by mouth 2 (  two) times daily.   methocarbamol 500 MG tablet Commonly known as:  ROBAXIN Take 500 mg by mouth every 6 (six) hours as needed for muscle spasms. Reported on 09/25/2015   oxyCODONE 5 MG immediate release tablet Commonly known as:  Oxy IR/ROXICODONE Take 1 tablet (5 mg total) by mouth every 6 (six) hours as needed for moderate pain.   polyethylene glycol packet Commonly known as:  MIRALAX / GLYCOLAX Take 17 g by mouth daily as needed for mild constipation.   potassium chloride 10 MEQ tablet Commonly known as:  K-DUR Take 1 tablet (10 mEq total) by mouth daily.   pramipexole 1 MG tablet Commonly known as:  MIRAPEX TAKE ONE TABLET BY MOUTH TWICE DAILY   VITAMIN B-6 PO Take by mouth.      Contact information for after-discharge care    [facility] Follow up.   Specialty:  [place] information: [place] [phone]             Allergies  Allergen Reactions  . Sulfonamide Derivatives Swelling    Lips Swelling  . Other     BLOOD PRODUCT REFUSAL     Consultations:  Ortho    Procedures/Studies: Ct Head Wo Contrast  Result Date: 08/01/2016 CLINICAL DATA:  76 year old female with altered mental status. Status post left knee surgery today. EXAM: CT HEAD WITHOUT CONTRAST TECHNIQUE: Contiguous axial images were obtained from the base of the skull through the vertex without intravenous contrast. COMPARISON:  Head CT dated 02/11/2016 FINDINGS: Brain: The ventricles and sulci appropriate size for patient's age. Moderate to advanced periventricular and deep white matter chronic microvascular ischemic changes noted. There is no acute intracranial hemorrhage. No mass effect or midline shift noted. No extra-axial fluid collection. Vascular: No hyperdense vessel or unexpected calcification. Skull: Normal. Negative for fracture or focal lesion. Sinuses/Orbits: No acute finding. Other: None. IMPRESSION: 1. No acute intracranial hemorrhage. 2. Moderate chronic microvascular ischemic changes. Electronically Signed   By: Anner Crete M.D.   On: 08/01/2016 18:49   Ct Knee Left Wo Contrast  Result Date: 08/01/2016 CLINICAL DATA:  76 year old female with left knee arthroplasty. Evaluate for fracture. EXAM: CT OF THE left KNEE WITHOUT CONTRAST TECHNIQUE: Multidetector CT imaging of the left knee was performed according to the standard protocol. Multiplanar CT image reconstructions were also generated. COMPARISON:  Left knee radiograph dated 07/31/2016 FINDINGS: Evaluation of this exam is very limited due to streak artifact caused by metallic knee arthroplasty. Bones/Joint/Cartilage There is a total left knee arthroplasty. The arthroplasty components appear intact and in anatomic alignment. No evidence of hardware loosening noted. There is faint linear lucency in the medial aspect of the proximal tibial ( series 7, image 53) which may be artifactual or represent a nondisplaced fracture. No the a other acute fracture identified. There is heterotopic bone formation  of the posterior femoral metaphysis at the arthroplasty interface. There is marrow heterogeneity of the distal femur. There is no dislocation. There is moderate amount of suprapatellar effusion. Correlation with clinical exam is recommended to evaluate for possibility of infection. Ligaments Suboptimally assessed by CT. Muscles and Tendons No intramuscular hematoma.  No definite acute tendon injury. Soft tissues There is mild subcutaneous soft tissue edema of the anterior knee. IMPRESSION: Very limited evaluation for fracture due to advanced osteopenia and streak artifact caused by metallic knee arthroplasty. There is an area of cortical irregularity versus less likely nondisplaced cortical fracture  involving the medial tibial metaphysis. No other acute fracture identified. There is. Ectopic bone formation posterior to the distal femoral metaphysis at the interface with the arthroplasty. Large suprapatellar effusion with mild edema of the subcutaneous soft tissues of the anterior knee. Correlation with clinical exam is recommended to evaluate for possible infection. Electronically Signed   By: Anner Crete M.D.   On: 08/01/2016 00:08   Dg Chest Port 1 View  Result Date: 08/04/2016 CLINICAL DATA:  Wheezing and shortness of breath EXAM: PORTABLE CHEST 1 VIEW COMPARISON:  August 01, 2016 in August 08, 2014 FINDINGS: There is elevation of the left hemidiaphragm. There is scarring in the left mid and lower lung zones, stable. Lungs elsewhere clear. Heart is mildly enlarged with pulmonary vascularity within normal limits. There is atherosclerotic calcification in the aorta. No adenopathy. No bone lesions. IMPRESSION: Stable elevation left hemidiaphragm with mild scarring on the left, stable. No edema or consolidation. Stable cardiac prominence. There is aortic atherosclerosis. Electronically Signed   By: Lowella Grip III M.D.   On: 08/04/2016 11:13   Portable Chest 1 View  Result Date: 08/01/2016 CLINICAL  DATA:  Preoperative study.  Right breast cancer. EXAM: PORTABLE CHEST 1 VIEW COMPARISON:  July 02, 2015 FINDINGS: No pneumothorax. Elevated left hemidiaphragm with atelectasis, unchanged. Unchanged cardiomediastinal silhouette. No overt edema. IMPRESSION: No interval change or acute abnormality. Elevated left hemidiaphragm with atelectasis, stable in the interval. Electronically Signed   By: Dorise Bullion III M.D   On: 08/01/2016 08:03   Dg Knee Complete 4 Views Left  Result Date: 07/31/2016 CLINICAL DATA:  76 y/o  F; status post fall with pain. EXAM: LEFT KNEE - COMPLETE 4+ VIEW COMPARISON:  02/07/2014 knee radiographs. FINDINGS: Posterior distal femur fracture at the bone prosthesis interface. Total knee arthroplasty and patellar resurfacing with prosthesis. Calcific bodies within the joint space. No addition periprosthetic lucency. IMPRESSION: Posterior distal femur fracture at the bone prosthesis interface. Electronically Signed   By: Kristine Garbe M.D.   On: 07/31/2016 21:19   Dg Hip Unilat With Pelvis 2-3 Views Left  Result Date: 07/31/2016 CLINICAL DATA:  Pt states she was standing and fell asleep and fell down this morning at her home on carpet. Pt has a hx of falling and has restless leg syndrome. Best obtainable EXAM: DG HIP (WITH OR WITHOUT PELVIS) 2-3V LEFT COMPARISON:  None. FINDINGS: Hips are located. No evidence of pelvic fracture or sacral fracture. Dedicated view of the LEFT hip demonstrates no femoral neck fracture. There is a ring of osteophytes which marks over the femoral neck. No clear evidence fracture. IMPRESSION: No clear evidence of LEFT femur fracture. Ring of osteophytes extends over the femoral neck. If there is continue clinical suspicion for occult hip fracture or the patient refuses to weightbear, consider further evaluation with MRI or CT. Electronically Signed   By: Suzy Bouchard M.D.   On: 07/31/2016 21:19      Subjective: She is alert today. Her  hearing aids needs batteries.  She has not had a BM.  Pain medications are helping with pain. She is tired of having pain. I epxplain that ortho will be by to speak about MRI of her back done at Dr Nelva Bush offices.  She denies suicidal thought    Discharge Exam: Vitals:   08/04/16 0459 08/04/16 0748  BP: (!) 182/77 (!) 157/79  Pulse: 66 (!) 58  Resp: 16 20  Temp: 98.2 F (36.8 C)    Vitals:   08/03/16 2321  08/04/16 0224 08/04/16 0459 08/04/16 0748  BP:  (!) 186/85 (!) 182/77 (!) 157/79  Pulse:  (!) 54 66 (!) 58  Resp: '18 16 16 20  ' Temp:  98.3 F (36.8 C) 98.2 F (36.8 C)   TempSrc:  Oral Oral   SpO2:  98% 98% 96%  Weight:      Height:        General: Pt is alert, awake, not in acute distress Cardiovascular: RRR, S1/S2 +, no rubs, no gallops Respiratory: CTA bilaterally, no wheezing, no rhonchi Abdominal: Soft, NT, ND, bowel sounds + Extremities: no edema, no cyanosis, left knee with immobilizer.     The results of significant diagnostics from this hospitalization (including imaging, microbiology, ancillary and laboratory) are listed below for reference.     Microbiology: Recent Results (from the past 240 hour(s))  Urine culture     Status: Abnormal   Collection Time: 08/01/16  5:47 PM  Result Value Ref Range Status   Specimen Description URINE, RANDOM  Final   Special Requests NONE  Final   Culture >=100,000 COLONIES/mL ESCHERICHIA COLI (A)  Final   Report Status 08/04/2016 FINAL  Final   Organism ID, Bacteria ESCHERICHIA COLI (A)  Final      Susceptibility   Escherichia coli - MIC*    AMPICILLIN <=2 SENSITIVE Sensitive     CEFAZOLIN <=4 SENSITIVE Sensitive     CEFTRIAXONE <=1 SENSITIVE Sensitive     CIPROFLOXACIN <=0.25 SENSITIVE Sensitive     GENTAMICIN <=1 SENSITIVE Sensitive     IMIPENEM <=0.25 SENSITIVE Sensitive     NITROFURANTOIN <=16 SENSITIVE Sensitive     TRIMETH/SULFA <=20 SENSITIVE Sensitive     AMPICILLIN/SULBACTAM <=2 SENSITIVE Sensitive      PIP/TAZO <=4 SENSITIVE Sensitive     Extended ESBL NEGATIVE Sensitive     * >=100,000 COLONIES/mL ESCHERICHIA COLI     Labs: BNP (last 3 results) No results for input(s): BNP in the last 8760 hours. Basic Metabolic Panel:  Recent Labs Lab 07/31/16 1932 08/01/16 0800 08/04/16 1055  NA 137 139 133*  K 3.0* 3.0* 4.1  CL 101 102 97*  CO2 '26 28 30  ' GLUCOSE 180* 167* 199*  BUN '9 14 18  ' CREATININE 0.51 0.69 0.51  CALCIUM 8.8* 8.8* 8.7*  MG  --  1.7  --    Liver Function Tests: No results for input(s): AST, ALT, ALKPHOS, BILITOT, PROT, ALBUMIN in the last 168 hours. No results for input(s): LIPASE, AMYLASE in the last 168 hours.  Recent Labs Lab 08/01/16 1448  AMMONIA 16   CBC:  Recent Labs Lab 07/31/16 1932 08/01/16 0800  WBC 12.5* 12.7*  NEUTROABS 9.9*  --   HGB 13.9 12.8  HCT 41.6 39.3  MCV 90.2 89.3  PLT 273 281   Cardiac Enzymes: No results for input(s): CKTOTAL, CKMB, CKMBINDEX, TROPONINI in the last 168 hours. BNP: Invalid input(s): POCBNP CBG:  Recent Labs Lab 08/02/16 2350 08/03/16 0752 08/03/16 1330 08/03/16 2206 08/04/16 0744  GLUCAP 194* 161* 216* 154* 155*   D-Dimer No results for input(s): DDIMER in the last 72 hours. Hgb A1c No results for input(s): HGBA1C in the last 72 hours. Lipid Profile No results for input(s): CHOL, HDL, LDLCALC, TRIG, CHOLHDL, LDLDIRECT in the last 72 hours. Thyroid function studies  Recent Labs  08/01/16 1448  TSH 1.993   Anemia work up  Recent Labs  08/01/16 1448  VITAMINB12 249   Urinalysis    Component Value Date/Time   COLORURINE AMBER (  A) 08/01/2016 1747   APPEARANCEUR CLOUDY (A) 08/01/2016 1747   LABSPEC 1.017 08/01/2016 1747   PHURINE 5.0 08/01/2016 1747   GLUCOSEU NEGATIVE 08/01/2016 1747   HGBUR SMALL (A) 08/01/2016 1747   BILIRUBINUR NEGATIVE 08/01/2016 1747   BILIRUBINUR n 08/12/2012 1139   KETONESUR NEGATIVE 08/01/2016 1747   PROTEINUR 100 (A) 08/01/2016 1747   UROBILINOGEN 0.2  07/27/2014 0930   NITRITE NEGATIVE 08/01/2016 1747   LEUKOCYTESUR LARGE (A) 08/01/2016 1747   Sepsis Labs Invalid input(s): PROCALCITONIN,  WBC,  LACTICIDVEN Microbiology Recent Results (from the past 240 hour(s))  Urine culture     Status: Abnormal   Collection Time: 08/01/16  5:47 PM  Result Value Ref Range Status   Specimen Description URINE, RANDOM  Final   Special Requests NONE  Final   Culture >=100,000 COLONIES/mL ESCHERICHIA COLI (A)  Final   Report Status 08/04/2016 FINAL  Final   Organism ID, Bacteria ESCHERICHIA COLI (A)  Final      Susceptibility   Escherichia coli - MIC*    AMPICILLIN <=2 SENSITIVE Sensitive     CEFAZOLIN <=4 SENSITIVE Sensitive     CEFTRIAXONE <=1 SENSITIVE Sensitive     CIPROFLOXACIN <=0.25 SENSITIVE Sensitive     GENTAMICIN <=1 SENSITIVE Sensitive     IMIPENEM <=0.25 SENSITIVE Sensitive     NITROFURANTOIN <=16 SENSITIVE Sensitive     TRIMETH/SULFA <=20 SENSITIVE Sensitive     AMPICILLIN/SULBACTAM <=2 SENSITIVE Sensitive     PIP/TAZO <=4 SENSITIVE Sensitive     Extended ESBL NEGATIVE Sensitive     * >=100,000 COLONIES/mL ESCHERICHIA COLI     Time coordinating discharge: Over 30 minutes  SIGNED:   Elmarie Shiley, MD  [facility] 08/04/2016, 11:59 AM Pager [phone]  If 7PM-7AM, please contact night-coverage [email or web address] Password [email or web address]

## 2016-08-04 NOTE — Progress Notes (Signed)
Attempted to call report to [facility]. On hold for an extensive period of time. Will try again; however contact information too call me is on patient's paperwork that will be transported with her.

## 2016-08-05 DIAGNOSIS — I10 Essential (primary) hypertension: Secondary | ICD-10-CM | POA: Diagnosis not present

## 2016-08-05 DIAGNOSIS — N390 Urinary tract infection, site not specified: Secondary | ICD-10-CM | POA: Diagnosis not present

## 2016-08-05 DIAGNOSIS — G4730 Sleep apnea, unspecified: Secondary | ICD-10-CM | POA: Diagnosis not present

## 2016-08-05 DIAGNOSIS — E1140 Type 2 diabetes mellitus with diabetic neuropathy, unspecified: Secondary | ICD-10-CM | POA: Diagnosis not present

## 2016-08-05 DIAGNOSIS — M9711XA Periprosthetic fracture around internal prosthetic right knee joint, initial encounter: Secondary | ICD-10-CM | POA: Diagnosis not present

## 2016-08-05 DIAGNOSIS — E119 Type 2 diabetes mellitus without complications: Secondary | ICD-10-CM | POA: Diagnosis not present

## 2016-08-05 DIAGNOSIS — I5032 Chronic diastolic (congestive) heart failure: Secondary | ICD-10-CM | POA: Diagnosis not present

## 2016-08-05 DIAGNOSIS — G8929 Other chronic pain: Secondary | ICD-10-CM | POA: Diagnosis not present

## 2016-08-07 DIAGNOSIS — M9712XD Periprosthetic fracture around internal prosthetic left knee joint, subsequent encounter: Secondary | ICD-10-CM | POA: Diagnosis not present

## 2016-08-07 DIAGNOSIS — N390 Urinary tract infection, site not specified: Secondary | ICD-10-CM | POA: Diagnosis not present

## 2016-08-07 DIAGNOSIS — I10 Essential (primary) hypertension: Secondary | ICD-10-CM | POA: Diagnosis not present

## 2016-08-07 DIAGNOSIS — R21 Rash and other nonspecific skin eruption: Secondary | ICD-10-CM | POA: Diagnosis not present

## 2016-08-11 DIAGNOSIS — S72492A Other fracture of lower end of left femur, initial encounter for closed fracture: Secondary | ICD-10-CM | POA: Diagnosis not present

## 2016-08-13 DIAGNOSIS — I10 Essential (primary) hypertension: Secondary | ICD-10-CM | POA: Diagnosis not present

## 2016-08-13 DIAGNOSIS — I5032 Chronic diastolic (congestive) heart failure: Secondary | ICD-10-CM | POA: Diagnosis not present

## 2016-08-13 DIAGNOSIS — E119 Type 2 diabetes mellitus without complications: Secondary | ICD-10-CM | POA: Diagnosis not present

## 2016-08-13 DIAGNOSIS — M9712XD Periprosthetic fracture around internal prosthetic left knee joint, subsequent encounter: Secondary | ICD-10-CM | POA: Diagnosis not present

## 2016-08-20 ENCOUNTER — Encounter: Payer: Self-pay | Admitting: Family Medicine

## 2016-08-20 DIAGNOSIS — Z471 Aftercare following joint replacement surgery: Secondary | ICD-10-CM | POA: Diagnosis not present

## 2016-08-20 DIAGNOSIS — M952 Other acquired deformity of head: Secondary | ICD-10-CM | POA: Insufficient documentation

## 2016-08-20 DIAGNOSIS — S72492A Other fracture of lower end of left femur, initial encounter for closed fracture: Secondary | ICD-10-CM | POA: Diagnosis not present

## 2016-08-20 DIAGNOSIS — Z96652 Presence of left artificial knee joint: Secondary | ICD-10-CM | POA: Diagnosis not present

## 2016-08-20 HISTORY — DX: Other acquired deformity of head: M95.2

## 2016-08-20 NOTE — Progress Notes (Deleted)
HPI:  [name] is a pleasant 76 y.o. here for follow up. She has a complicated PMH (see below) and a hx of poor compliance. Chronic medical problems summarized below were reviewed for changes and stability and were updated as needed below. She was hospitalized recently for a fall with a femur fracture. She had a UTI in the hospital - treated.  Is seeing orthopedics. Her coreg was increased, norvasc was added and hydralazine was used in the hospital. Denies CP, SOB, DOE, treatment intolerance or new symptoms. Due for foot exam, cardiology follow up,  DM: -meds: lantus 16 unists, metformin [id number], -complications: neuropathy -eye xam 08/2015  HTN/CdCHF/LE edema: -meds: norvasc, coreg 6.25, lasix 36m, lisinopril -sees cardiologist, Dr. HPercival Spanish- missed the follow up they recommended  MDD/Anxiety: -meds: finally agreed to cymbalta for this and pain   OSA: -sees pulmonologist -noncompliant with CPAP in the past  Chronic pain/OA diffuse/DDD: -sees Dr. 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 GLindi Adie ROS: See pertinent positives and negatives per HPI.  Past Medical History:  Diagnosis Date  . AK (actinic keratosis)   . Anxiety    occasional  . [place]    R breast, s/p radiation 34Gy/151f12/13/11-12/19/11  . CTS (carpal tunnel syndrome)    Left  . Diabetes ([place]  . Diastolic CHF ([place]   Dx w/ hospitalization 06/2015 for respiratory failure  . Facial asymmetry, acquired 08/20/2016   From injury  . Falls frequently    "can not pick left leg up"  . Foot fracture   . Hearing loss    bilateral hearing aides  . History of environmental allergies   . History of kidney stones 20 years ago  . Hypertension   . MDD (major depressive disorder)   . No blood  products ([employer])  09/11/2013  . Osteoarthritis    hx shoulder, knee, back, wrist pain; saw Dr. AlWynelle Link . RESTLESS LEGS SYNDROME 05/05/2007   Qualifier: Diagnosis of  By: ClGwenette GreetD, KeArmando Reichert . RLS (restless legs syndrome)   . Sleep apnea     Past Surgical History:  Procedure Laterality Date  . APPENDECTOMY  age 76. BREAST SURGERY  2012   rt breast lumpectomy  . flex signoidoscopy    . INCISION AND DRAINAGE PERIRECTAL ABSCESS    . JOINT REPLACEMENT Right 2006   knee  . TOTAL KNEE ARTHROPLASTY Left 08/06/2014   Procedure: LEFT TOTAL KNEE ARTHROPLASTY;  Surgeon: FrGaynelle ArabianMD;  Location: [facility];  Service: Orthopedics;  Laterality: Left;  . TUBAL LIGATION  1979    Family History  Problem Relation Age of Onset  . Alcohol abuse Father   . Cancer Sister     breast  . Heart disease Paternal Grandfather   . Diabetes Other   . Obesity Other   . Heart disease Brother     Valve    Social History   Social History  . Marital status: Married    Spouse name: N/A  . Number of children: 3  . Years of education: N/A   Social History Main Topics  . Smoking status: Never Smoker  . Smokeless tobacco: Never Used  . Alcohol use No  . Drug use: No  . Sexual activity: Yes  Other Topics Concern  . Not on file   Social History Narrative         Home Situation: lives with husband and granddaughter      Spiritual Beliefs: Jehovah Witness - no blood products                    Current Outpatient Prescriptions:  .  albuterol (PROVENTIL) (2.5 MG/3ML) 0.083% nebulizer solution, Take 3 mLs (2.5 mg total) by nebulization every 6 (six) hours as needed for wheezing or shortness of breath., Disp: 75 mL, Rfl: 12 .  amLODipine (NORVASC) 5 MG tablet, Take 1 tablet (5 mg total) by mouth daily., Disp: 30 tablet, Rfl: 0 .  anastrozole (ARIMIDEX) 1 MG tablet, TAKE ONE TABLET BY MOUTH ONCE DAILY, Disp: 30 tablet, Rfl: 11 .  carvedilol (COREG) 3.125 MG tablet, Take 1 tablet  (3.125 mg total) by mouth 2 (two) times daily with a meal., Disp: 180 tablet, Rfl: 1 .  cephALEXin (KEFLEX) 500 MG capsule, Take 1 capsule (500 mg total) by mouth 3 (three) times daily., Disp: 9 capsule, Rfl: 0 .  cholecalciferol (VITAMIN D) 1000 units tablet, Take 1,000 Units by mouth 2 (two) times daily., Disp: , Rfl:  .  DULoxetine (CYMBALTA) 20 MG capsule, Take 1 capsule (20 mg total) by mouth daily. (Patient taking differently: Take 20 mg by mouth daily as needed. Mood), Disp: 30 capsule, Rfl: 3 .  furosemide (LASIX) 40 MG tablet, Take 1 tablet (40 mg total) by mouth daily., Disp: 90 tablet, Rfl: 1 .  Insulin Glargine (LANTUS SOLOSTAR) 100 UNIT/ML Solostar Pen, Inject 16 Units into the skin daily at 10 pm., Disp: 5 pen, Rfl: 3 .  Insulin Pen Needle (BD PEN NEEDLE NANO U/F) 32G X 4 MM MISC, Use as directed once a day, Disp: 100 each, Rfl: 1 .  lisinopril (PRINIVIL,ZESTRIL) 20 MG tablet, Take 1 tablet (20 mg total) by mouth daily., Disp: 90 tablet, Rfl: 1 .  metFORMIN (GLUCOPHAGE) 1000 MG tablet, Take 1 tablet (1,000 mg total) by mouth 2 (two) times daily., Disp: 180 tablet, Rfl: 2 .  methocarbamol (ROBAXIN) 500 MG tablet, Take 500 mg by mouth every 6 (six) hours as needed for muscle spasms. Reported on 09/25/2015, Disp: , Rfl:  .  oxyCODONE (OXY IR/ROXICODONE) 5 MG immediate release tablet, Take 1 tablet (5 mg total) by mouth every 6 (six) hours as needed for moderate pain., Disp: 30 tablet, Rfl: 0 .  polyethylene glycol (MIRALAX / GLYCOLAX) packet, Take 17 g by mouth daily as needed for mild constipation., Disp: 14 each, Rfl: 0 .  potassium chloride (K-DUR) 10 MEQ tablet, Take 1 tablet (10 mEq total) by mouth daily., Disp: 30 tablet, Rfl: 11 .  pramipexole (MIRAPEX) 1 MG tablet, TAKE ONE TABLET BY MOUTH TWICE DAILY, Disp: 180 tablet, Rfl: 1 .  Pyridoxine HCl (VITAMIN B-6 PO), Take by mouth., Disp: , Rfl:  .  vitamin B-12 100 MCG tablet, Take 1 tablet (100 mcg total) by mouth daily., Disp: 30  tablet, Rfl: 0  EXAM:  There were no vitals filed for this visit.  There is no height or weight on file to calculate BMI.  GENERAL: vitals reviewed and listed above, alert, oriented, appears well hydrated and in no acute distress  HEENT: atraumatic, conjunttiva clear, no obvious abnormalities on inspection of external nose and ears  NECK: no obvious masses on inspection  LUNGS: clear to auscultation bilaterally, no wheezes, rales or rhonchi, good air movement  CV:  HRRR, no peripheral edema  MS: moves all extremities without noticeable abnormality  PSYCH: pleasant and cooperative, no obvious depression or anxiety  ASSESSMENT AND PLAN:  Discussed the following assessment and plan:  Facial asymmetry, acquired  -Patient advised to return or notify a doctor immediately if symptoms worsen or persist or new concerns arise.  There are no Patient Instructions on file for this visit.  Colin Benton R., DO

## 2016-08-21 ENCOUNTER — Ambulatory Visit: Payer: Medicare HMO | Admitting: Family Medicine

## 2016-08-28 ENCOUNTER — Other Ambulatory Visit: Payer: Self-pay

## 2016-08-28 NOTE — Patient Outreach (Signed)
[facility] [facility]) Care Management  08/28/2016  TALEA MANGES 01-13-1941 [id number]     Transition of Care Referral  Referral Date: 08/28/16 Referral Source: [facility] Discharge Report Date of Hospitalization: [facility] 07/31/16-08/04/16 Date of Discharge: 08/27/16 Facility: [place] [facility] [place] Discharge Diagnosis: fall, fractured knee Insurance: [employer] attempt # 1 to patient.  No answer at present. RN CM left HIPAA compliant voicemail message along with contact info.   Plan: RN CM will make outreach attempt to patient within one business day if no return call from patient.    Enzo Montgomery, RN,BSN,CCM [facility] Telephonic Care Management Coordinator Direct Phone: [phone] Toll Free: [phone] Fax: [phone]

## 2016-08-31 ENCOUNTER — Other Ambulatory Visit: Payer: Self-pay

## 2016-08-31 NOTE — Patient Outreach (Signed)
[place] [facility]) Care Management  08/31/2016  [name] 1941-03-14 [id number]   Transition of Care Referral  Referral Date: 08/28/16 Referral Source: [facility] Discharge Report Date of Hospitalization: [facility] 07/31/16-08/04/16 Date of Discharge: 08/27/16 Facility: [place] [facility] [place] Discharge Diagnosis: fall, fractured knee Insurance: [employer]   Outreach attempt #2 to patient. Spoke with patient but very bad phone connection and difficulty hearing patient. She states she is doing good since discharge from the hospital. Patient currently out and about. She denies any issues regarding meds and transportation. She has f/u appt in place. Call became disconnected. No RN CM needs or concerns noted at this time.    Plan: RN CM will notify [facility] administrative assistant of case status.

## 2016-09-18 DIAGNOSIS — M9712XD Periprosthetic fracture around internal prosthetic left knee joint, subsequent encounter: Secondary | ICD-10-CM | POA: Diagnosis not present

## 2016-09-22 ENCOUNTER — Ambulatory Visit (INDEPENDENT_AMBULATORY_CARE_PROVIDER_SITE_OTHER): Payer: Medicare HMO | Admitting: Family Medicine

## 2016-09-22 ENCOUNTER — Encounter: Payer: Self-pay | Admitting: Family Medicine

## 2016-09-22 VITALS — BP 124/70 | HR 73 | Temp 97.8°F | Ht 61.0 in | Wt 196.3 lb

## 2016-09-22 DIAGNOSIS — C50411 Malignant neoplasm of upper-outer quadrant of right female breast: Secondary | ICD-10-CM | POA: Diagnosis not present

## 2016-09-22 DIAGNOSIS — E1140 Type 2 diabetes mellitus with diabetic neuropathy, unspecified: Secondary | ICD-10-CM

## 2016-09-22 DIAGNOSIS — F330 Major depressive disorder, recurrent, mild: Secondary | ICD-10-CM

## 2016-09-22 DIAGNOSIS — Z6836 Body mass index (BMI) 36.0-36.9, adult: Secondary | ICD-10-CM | POA: Diagnosis not present

## 2016-09-22 DIAGNOSIS — T84019A Broken internal joint prosthesis, unspecified site, initial encounter: Secondary | ICD-10-CM | POA: Diagnosis not present

## 2016-09-22 DIAGNOSIS — Z794 Long term (current) use of insulin: Secondary | ICD-10-CM

## 2016-09-22 DIAGNOSIS — E785 Hyperlipidemia, unspecified: Secondary | ICD-10-CM | POA: Diagnosis not present

## 2016-09-22 DIAGNOSIS — I10 Essential (primary) hypertension: Secondary | ICD-10-CM

## 2016-09-22 DIAGNOSIS — I5032 Chronic diastolic (congestive) heart failure: Secondary | ICD-10-CM

## 2016-09-22 LAB — LIPID PANEL
CHOL/HDL RATIO: 4
Cholesterol: 141 mg/dL (ref 0–200)
HDL: 39.1 mg/dL (ref >39.00)
LDL Cholesterol: 88 mg/dL (ref 0–99)
NONHDL: 101.81
Triglycerides: 67 mg/dL (ref 0.0–149.0)
VLDL: 13.4 mg/dL (ref 0.0–40.0)

## 2016-09-22 LAB — BASIC METABOLIC PANEL
BUN: 14 mg/dL (ref 6–23)
CHLORIDE: 101 meq/L (ref 96–112)
CO2: 33 mEq/L — ABNORMAL HIGH (ref 19–32)
Calcium: 9.2 mg/dL (ref 8.4–10.5)
Creatinine, Ser: 0.57 mg/dL (ref 0.40–1.20)
GFR: 109.17 mL/min (ref >60.00)
Glucose, Bld: 124 mg/dL — ABNORMAL HIGH (ref 70–99)
POTASSIUM: 3.7 meq/L (ref 3.5–5.1)
Sodium: 140 mEq/L (ref 135–145)

## 2016-09-22 LAB — CBC
HCT: 38.8 % (ref 36.0–46.0)
HEMOGLOBIN: 12.9 g/dL (ref 12.0–15.0)
MCHC: 33.1 g/dL (ref 30.0–36.0)
MCV: 91.9 fl (ref 78.0–100.0)
PLATELETS: 296 10*3/uL (ref 150.0–400.0)
RBC: 4.22 Mil/uL (ref 3.87–5.11)
RDW: 14.6 % (ref 11.5–15.5)
WBC: 9.4 10*3/uL (ref 4.0–10.5)

## 2016-09-22 MED ORDER — GLUCOSE BLOOD VI STRP: ORAL_STRIP | 12 refills | Status: DC

## 2016-09-22 MED ORDER — ONETOUCH VERIO FLEX SYSTEM W/DEVICE KIT: 1.0000 | PACK | Freq: Once | 0 refills | Status: AC

## 2016-09-22 MED ORDER — ONETOUCH ULTRASOFT LANCETS MISC: 11 refills | Status: DC

## 2016-09-22 NOTE — Progress Notes (Signed)
Agree and appreciate recs. I think for the diabetic shoes, unfortunately they will usually need her to see me or podiatry to be able to order. If abnormal foot exam usually recommend podiatry referral if pt agreeable. Will forward this note to RN to see pt preference. Colin Benton R., DO

## 2016-09-22 NOTE — Patient Instructions (Addendum)
BEFORE YOU LEAVE: -new glucose meter -labs -follow up: 3 months  Please follow up with your sleep specialist.  Check fasting blood sugars several days per week and please keep a blood sugar log.  FOR YOUR DIABETES:  '[]'$  Eat a healthy clean diet with avoidance of (less then 1 serving per week) processed foods, white starches, red meat, fast foods and sweets and consisting of: * 5-9 servings of fresh or frozen fruits and vegetables (not corn or potatoes, not dried or canned) *nuts and seeds, beans *olives and olive oil *small portions of lean meats such as fish and white chicken  *small portions of whole grains  '[]'$  Get AT LEAST 150 minutes of cardiovascular exercise per week - 30 minutes per day is best of sustained sweaty exercise.  '[]'$  Take all of your medications every day as directed by your doctor. Call your doctor immediately if you have any questions about your medications or are running low.  '[]'$  Check your blood sugar often and when you feel unwell and keep a log to bring to all health appointments. FASTING: before you eat anything in the morning POSTPRANDIAL: 1-2 hours after a meal  '[]'$  If any low blood sugars < 70, eat a snack and call your doctor immediately.  '[]'$  See an eye doctor every year and fax your diabetic eye exam to our office.  Fax: [phone]    Ms. Collister , Thank you for taking time to come for your Medicare Wellness Visit. I appreciate your ongoing commitment to your health goals. Please review the following plan we discussed and let me know if I can assist you in the future.   Have Dr. Maudie Mercury Rx diabetic shoes   When she gets back from [place];  She will be getting 3 weeks of exercise to strengthen her leg   Will check BS in the am and try to take 3 times a week per Dr. Maudie Mercury  Try to get 1200 mg of Calcium and Vit d 800 to 1000 u per day [employer] Information Description of Services Cost  A Matter of Balance Class locations  vary. Call [place] on Aging for more information.  http://[email or web address] [phone] 8-Session program addressing the fear of falling and increasing activity levels of older adults Free to minimal cost  A.C.T. By [employer] [address] [place], [place] [address].  BetaBlues.[email or web address] [phone]  Personal training, gym, classes including Silver Sneakers* and ACTion for Aging Adults Fee-based  A.H.O.Y. (Add Health to [place]) Airs on Time [employer] 13, M-F at [place]: [employer],  [place] [facility] [place],  [place], [place] [facility], [address] [facility], [place], [place], [place] [address]  [place] Location: Sharrell Ku. [employer] [place]      [phone]  [phone]  [phone]  [phone]  [phone]  [phone]  [phone]  [phone]  [phone]  [phone]    [phone] A total-body conditioning class for adults 73 and older; designed to increase muscular strength, endurance, range of movement, flexibility, balance, agility and coordination Free  [facility] [place], [place] [address] [place]  [place]      [address]      [phone]      Pilate's class for individualsreturning to exercise after an injury, before or after surgery or for individuals with complex musculoskeletal issues; designed to improve strength, balance , flexibility      $15/class  [place] [address] [place], [place] [address] [email or web address]  [place] classes for beginners to advanced [place], [place] [address] [email or web address] [place] Chair Exercises Free, ages 31 and older; Ages 84-59 fee based  Marvia Pickles, [facility] [address] [place], [place] [address] [email or web address] .Beverlee Nims [phone]  A.H.O.Y. Tai Chi Fee-based Donation based or free  [place] Class locations vary.  Call or email Angela Burke or view website for more information. [email or web address] [email or web address] [phone] Ongoing classes at local YMCAs and gyms Fee-based  Silver Sneakers A.C.T. By [place] [employer]: [place] [phone]  [phone] Classes designed for older adults who want to improve their strength, flexibility, balance and endurance.   Silver sneakers is covered by some insurance plans and includes a fitness center membership at participating locations. Find out more by calling [phone] or visiting [email or web address] Covered by some insurance plans  [facility] [place] [phone] A.H.O.Y., fitness room, personal training, fitness classes for injury prevention, strength, balance, flexibility, water fitness classes Ages 55+: $82 for 6 months; Ages 36-54: $29 for 6 months  Tai Chi for Everybody [facility] [address] [place], [place] [address] [email or web address] .Patsi Sears [phone] Tai Chi  classes for beginners to advanced; geared for seniors Donation Based      [employer] ([place] Others Participate in Exercise     [place] Gambler. Rosana Hoes, PhD, [place] [email or web address] [place]     [phone]     A comprehensive fitness program for adults.  The program paris senior-level undergraduates Kinesiology students with adults who desire to learn how to exercise safely.  Includes a structural exercise class focusing on functional fitnesss     $100/semester in fall and spring; $75 in summer (no trainers)    *Silver Sneakers is covered by some Personal assistant and includes a  Radio producer at participating locations.  Find out more by calling [phone] or visiting [email or web address]  For additional health and human services resources for senior adults, please contact SeniorLine at [phone] in [place] and [place] at [phone] in all other areas.  Summary: Preventive Care for Adults  A healthy lifestyle and preventive care can promote health and wellness. Preventive health guidelines for adults include the following key practices.  . A routine yearly physical is a good way to check with your health care provider about your health and preventive screening. It is a chance to share any concerns and updates on your health and to receive a thorough exam.  . Visit your dentist for a routine exam and preventive care every 6 months. Brush your teeth twice a day and floss once a day. Good oral hygiene prevents tooth decay and gum disease.  . The frequency of eye exams is  based on your age, health, family medical history, use  of contact lenses, and other factors. Follow your health care provider's ecommendations for frequency of eye exams.  . Eat a healthy diet. Foods like vegetables, fruits, whole grains, low-fat dairy products, and lean protein foods contain the nutrients you need without too many calories. Decrease your intake of foods high in solid fats,  added sugars, and salt. Eat the right amount of calories for you. Get information about a proper diet from your health care provider, if necessary.  . Regular physical exercise is one of the most important things you can do for your health. Most adults should get at least 150 minutes of moderate-intensity exercise (any activity that increases your heart rate and causes you to sweat) each week. In addition, most adults need muscle-strengthening exercises on 2 or more days a week.  Silver Sneakers may be a benefit available to you. To determine eligibility, you may visit the website: www.silversneakers.com or contact program at [phone] Mon-Fri between 8AM-8PM.   . Maintain a healthy weight. The body mass index (BMI) is a screening tool to identify possible weight problems. It provides an estimate of body fat based on height and weight. Your health care provider can find your BMI and can help you achieve or maintain a healthy weight.   For adults 20 years and older: ? A BMI below 18.5 is considered underweight. ? A BMI of 18.5 to 24.9 is normal. ? A BMI of 27 to 28 is considered normal by the Institutes of Health  ? A BMI of 30 and above is considered obese.   . Maintain normal blood lipids and cholesterol levels by exercising and minimizing your intake of saturated fat. Eat a balanced diet with plenty of fruit and vegetables. Blood tests for lipids and cholesterol should begin at age 1 and be repeated every 5 years. If your lipid or cholesterol levels are high, you are over 50, or you are at high risk for heart disease, you may need your cholesterol levels checked more frequently. Ongoing high lipid and cholesterol levels should be treated with medicines if diet and exercise are not working.  . If you smoke, find out from your health care provider how to quit. If you do not use tobacco, please do not start.  . If you choose to drink alcohol, please do not consume more than one drink  for women and 2 for men.  One drink is considered to be 12 ounces (355 mL) of beer, 5 ounces (148 mL) of wine, or 1.5 ounces (44 mL) of liquor. Moderation of alcohol intake to this level decreases your risk of breast cancer and liver damage.   . If you are 44-22 years old, ask your health care provider if you should take aspirin to prevent strokes.  . Use sunscreen. Apply sunscreen liberally and repeatedly throughout the day. You should seek shade when your shadow is shorter than you. Protect yourself by wearing long sleeves, pants, a wide-brimmed hat, and sunglasses year round, whenever you are outdoors.  . Once a month, do a whole body skin exam, using a mirror to look at the skin on your back. Tell your health care provider of new moles, moles that have irregular borders, moles that are larger than a pencil eraser, or moles that have changed in shape or color.  Last, if you have completed an Advanced Directive; please bring a copy and review with your physician and then we will scan to the  medical record      These are the goals we discussed: Goals    . Stay well and out of the hospital. (pt-stated)       This is a list of the screening recommended for you and due dates:  Health Maintenance  Topic Date Due  . Complete foot exam   07/25/2016  . Eye exam for diabetics  08/25/2016  . Flu Shot  12/16/2016  . Hemoglobin A1C  02/01/2017  . Tetanus Vaccine  10/20/2025  . DEXA scan (bone density measurement)  Completed  . Pneumonia vaccines  Completed     '[]'$  Take good care of your feet and keep them soft and callus free. Check your feet daily and wear comfortable shoes. See your doctor immediately if you have any cuts, calluses or wounds on your feet.  Preventing Falls and Fractures  Falls can be very serious, especially for older adults or people with osteoporosis  Falls can be caused by:  Tripping or slipping  Slow reflexes  Balance problems  Reduced muscle  strength  Poor vision or a recent change in prescription  Illness and some medications (especially blood pressure pills, diuretics, heart medicines, muscle relaxants and sleep medications)  Drinking alcohol  To prevent falls outdoors:  Use a can or walker if needed  Wear rubber-soled shoes so you don't slip  DO NOT buy "shape up" shoes with rocker bottom soles if you have balance problems.  The thick soles and shape make it more difficult to keep your balance.  Put kitty litter or salt on icy sidewalks  Walk on the grass if the sidewalks are slick  Avoid walking on uneven ground whenever possible  T prevent falls indoors:  Keep rooms clutter-free, especially hallways, stairs and paths to light switches  Remove throw rugs  Install night lights, especially to and in the bathroom  Turn on lights before going downstairs  Keep a flashlight next to your bed  Buy a cordless phone to keep with you instead of jumping up to answer the phone  Install grab bars in the bathroom near the shower and toilet  Install rails on both sides of the stairs.  Make sure the stairs are well lit  Wear slippers with non-skid soles.  Do not walk around in stockings or socks  Balance problems and dizziness are not a normal part of growing older.  If you begin having balance problems or dizziness see your doctor.  Physical Therapy can help you with many balance problems, strengthening hip and leg muscles and with gait training.  To keep your bones healthy make sure you are getting enough calcium and Vitamin D each day.  Ask your doctor or pharmacist about supplements.  Regular weight-bearing exercise like walking, lifting weights or dancing can help strengthen bones and prevent osteoporosis.   Health Maintenance, Female Adopting a healthy lifestyle and getting preventive care can go a long way to promote health and wellness. Talk with your health care provider about what schedule of regular  examinations is right for you. This is a good chance for you to check in with your provider about disease prevention and staying healthy. In between checkups, there are plenty of things you can do on your own. Experts have done a lot of research about which lifestyle changes and preventive measures are most likely to keep you healthy. Ask your health care provider for more information. Weight and diet Eat a healthy diet  Be sure to include plenty of vegetables,  fruits, low-fat dairy products, and lean protein.  Do not eat a lot of foods high in solid fats, added sugars, or salt.  Get regular exercise. This is one of the most important things you can do for your health.  Most adults should exercise for at least 150 minutes each week. The exercise should increase your heart rate and make you sweat (moderate-intensity exercise).  Most adults should also do strengthening exercises at least twice a week. This is in addition to the moderate-intensity exercise. Maintain a healthy weight  Body mass index (BMI) is a measurement that can be used to identify possible weight problems. It estimates body fat based on height and weight. Your health care provider can help determine your BMI and help you achieve or maintain a healthy weight.  For females 49 years of age and older:  A BMI below 18.5 is considered underweight.  A BMI of 18.5 to 24.9 is normal.  A BMI of 25 to 29.9 is considered overweight.  A BMI of 30 and above is considered obese. Watch levels of cholesterol and blood lipids  You should start having your blood tested for lipids and cholesterol at 78 years of age, then have this test every 5 years.  You may need to have your cholesterol levels checked more often if:  Your lipid or cholesterol levels are high.  You are older than 78 years of age.  You are at high risk for heart disease. Cancer screening Lung Cancer  Lung cancer screening is recommended for adults 55-80 years  old who are at high risk for lung cancer because of a history of smoking.  A yearly low-dose CT scan of the lungs is recommended for people who:  Currently smoke.  Have quit within the past 15 years.  Have at least a 30-pack-year history of smoking. A pack year is smoking an average of one pack of cigarettes a day for 1 year.  Yearly screening should continue until it has been 15 years since you quit.  Yearly screening should stop if you develop a health problem that would prevent you from having lung cancer treatment. Breast Cancer  Practice breast self-awareness. This means understanding how your breasts normally appear and feel.  It also means doing regular breast self-exams. Let your health care provider know about any changes, no matter how small.  If you are in your 20s or 30s, you should have a clinical breast exam (CBE) by a health care provider every 1-3 years as part of a regular health exam.  If you are 75 or older, have a CBE every year. Also consider having a breast X-ray (mammogram) every year.  If you have a family history of breast cancer, talk to your health care provider about genetic screening.  If you are at high risk for breast cancer, talk to your health care provider about having an MRI and a mammogram every year.  Breast cancer gene (BRCA) assessment is recommended for women who have family members with BRCA-related cancers. BRCA-related cancers include:  Breast.  Ovarian.  Tubal.  Peritoneal cancers.  Results of the assessment will determine the need for genetic counseling and BRCA1 and BRCA2 testing. Cervical Cancer  Your health care provider may recommend that you be screened regularly for cancer of the pelvic organs (ovaries, uterus, and vagina). This screening involves a pelvic examination, including checking for microscopic changes to the surface of your cervix (Pap test). You may be encouraged to have this screening done every 3  years, beginning  at age 55.  For women ages 37-65, health care providers may recommend pelvic exams and Pap testing every 3 years, or they may recommend the Pap and pelvic exam, combined with testing for human papilloma virus (HPV), every 5 years. Some types of HPV increase your risk of cervical cancer. Testing for HPV may also be done on women of any age with unclear Pap test results.  Other health care providers may not recommend any screening for nonpregnant women who are considered low risk for pelvic cancer and who do not have symptoms. Ask your health care provider if a screening pelvic exam is right for you.  If you have had past treatment for cervical cancer or a condition that could lead to cancer, you need Pap tests and screening for cancer for at least 20 years after your treatment. If Pap tests have been discontinued, your risk factors (such as having a new sexual partner) need to be reassessed to determine if screening should resume. Some women have medical problems that increase the chance of getting cervical cancer. In these cases, your health care provider may recommend more frequent screening and Pap tests. Colorectal Cancer  This type of cancer can be detected and often prevented.  Routine colorectal cancer screening usually begins at 78 years of age and continues through 78 years of age.  Your health care provider may recommend screening at an earlier age if you have risk factors for colon cancer.  Your health care provider may also recommend using home test kits to check for hidden blood in the stool.  A small camera at the end of a tube can be used to examine your colon directly (sigmoidoscopy or colonoscopy). This is done to check for the earliest forms of colorectal cancer.  Routine screening usually begins at age 70.  Direct examination of the colon should be repeated every 5-10 years through 78 years of age. However, you may need to be screened more often if early forms of precancerous  polyps or small growths are found. Skin Cancer  Check your skin from head to toe regularly.  Tell your health care provider about any new moles or changes in moles, especially if there is a change in a mole's shape or color.  Also tell your health care provider if you have a mole that is larger than the size of a pencil eraser.  Always use sunscreen. Apply sunscreen liberally and repeatedly throughout the day.  Protect yourself by wearing long sleeves, pants, a wide-brimmed hat, and sunglasses whenever you are outside. Heart disease, diabetes, and high blood pressure  High blood pressure causes heart disease and increases the risk of stroke. High blood pressure is more likely to develop in:  People who have blood pressure in the high end of the normal range (130-139/85-89 mm Hg).  People who are overweight or obese.  People who are African American.  If you are 79-67 years of age, have your blood pressure checked every 3-5 years. If you are 72 years of age or older, have your blood pressure checked every year. You should have your blood pressure measured twice-once when you are at a hospital or clinic, and once when you are not at a hospital or clinic. Record the average of the two measurements. To check your blood pressure when you are not at a hospital or clinic, you can use:  An automated blood pressure machine at a pharmacy.  A home blood pressure monitor.  If you are  between 49 years and 34 years old, ask your health care provider if you should take aspirin to prevent strokes.  Have regular diabetes screenings. This involves taking a blood sample to check your fasting blood sugar level.  If you are at a normal weight and have a low risk for diabetes, have this test once every three years after 78 years of age.  If you are overweight and have a high risk for diabetes, consider being tested at a younger age or more often. Preventing infection Hepatitis B  If you have a higher  risk for hepatitis B, you should be screened for this virus. You are considered at high risk for hepatitis B if:  You were born in a country where hepatitis B is common. Ask your health care provider which countries are considered high risk.  Your parents were born in a high-risk country, and you have not been immunized against hepatitis B (hepatitis B vaccine).  You have HIV or AIDS.  You use needles to inject street drugs.  You live with someone who has hepatitis B.  You have had sex with someone who has hepatitis B.  You get hemodialysis treatment.  You take certain medicines for conditions, including cancer, organ transplantation, and autoimmune conditions. Hepatitis C  Blood testing is recommended for:  Everyone born from [birth year] through 1967.  Anyone with known risk factors for hepatitis C. Sexually transmitted infections (STIs)  You should be screened for sexually transmitted infections (STIs) including gonorrhea and chlamydia if:  You are sexually active and are younger than 78 years of age.  You are older than 78 years of age and your health care provider tells you that you are at risk for this type of infection.  Your sexual activity has changed since you were last screened and you are at an increased risk for chlamydia or gonorrhea. Ask your health care provider if you are at risk.  If you do not have HIV, but are at risk, it may be recommended that you take a prescription medicine daily to prevent HIV infection. This is called pre-exposure prophylaxis (PrEP). You are considered at risk if:  You are sexually active and do not regularly use condoms or know the HIV status of your partner(s).  You take drugs by injection.  You are sexually active with a partner who has HIV. Talk with your health care provider about whether you are at high risk of being infected with HIV. If you choose to begin PrEP, you should first be tested for HIV. You should then be tested every 3  months for as long as you are taking PrEP. Pregnancy  If you are premenopausal and you may become pregnant, ask your health care provider about preconception counseling.  If you may become pregnant, take 400 to 800 micrograms (mcg) of folic acid every day.  If you want to prevent pregnancy, talk to your health care provider about birth control (contraception). Osteoporosis and menopause  Osteoporosis is a disease in which the bones lose minerals and strength with aging. This can result in serious bone fractures. Your risk for osteoporosis can be identified using a bone density scan.  If you are 37 years of age or older, or if you are at risk for osteoporosis and fractures, ask your health care provider if you should be screened.  Ask your health care provider whether you should take a calcium or vitamin D supplement to lower your risk for osteoporosis.  Menopause may have certain physical  symptoms and risks.  Hormone replacement therapy may reduce some of these symptoms and risks. Talk to your health care provider about whether hormone replacement therapy is right for you. Follow these instructions at home:  Schedule regular health, dental, and eye exams.  Stay current with your immunizations.  Do not use any tobacco products including cigarettes, chewing tobacco, or electronic cigarettes.  If you are pregnant, do not drink alcohol.  If you are breastfeeding, limit how much and how often you drink alcohol.  Limit alcohol intake to no more than 1 drink per day for nonpregnant women. One drink equals 12 ounces of beer, 5 ounces of wine, or 1 ounces of hard liquor.  Do not use street drugs.  Do not share needles.  Ask your health care provider for help if you need support or information about quitting drugs.  Tell your health care provider if you often feel depressed.  Tell your health care provider if you have ever been abused or do not feel safe at home. This information is  not intended to replace advice given to you by your health care provider. Make sure you discuss any questions you have with your health care provider. Document Released: 11/17/2010 Document Revised: 10/10/2015 Document Reviewed: 02/05/2015 Elsevier Interactive Patient Education  2017 [place] DASH stands for "Dietary Approaches to Stop Hypertension." The DASH eating plan is a healthy eating plan that has been shown to reduce high blood pressure (hypertension). It may also reduce your risk for type 2 diabetes, heart disease, and stroke. The DASH eating plan may also help with weight loss. What are tips for following this plan? General guidelines   Avoid eating more than 2,300 mg (milligrams) of salt (sodium) a day. If you have hypertension, you may need to reduce your sodium intake to 1,500 mg a day.  Limit alcohol intake to no more than 1 drink a day for nonpregnant women and 2 drinks a day for men. One drink equals 12 oz of beer, 5 oz of wine, or 1 oz of hard liquor.  Work with your health care provider to maintain a healthy body weight or to lose weight. Ask what an ideal weight is for you.  Get at least 30 minutes of exercise that causes your heart to beat faster (aerobic exercise) most days of the week. Activities may include walking, swimming, or biking.  Work with your health care provider or diet and nutrition specialist (dietitian) to adjust your eating plan to your individual calorie needs. Reading food labels   Check food labels for the amount of sodium per serving. Choose foods with less than 5 percent of the Daily Value of sodium. Generally, foods with less than 300 mg of sodium per serving fit into this eating plan.  To find whole grains, look for the word "whole" as the first word in the ingredient list. Shopping   Buy products labeled as "low-sodium" or "no salt added."  Buy fresh foods. Avoid canned foods and premade or frozen meals. Cooking    Avoid adding salt when cooking. Use salt-free seasonings or herbs instead of table salt or sea salt. Check with your health care provider or pharmacist before using salt substitutes.  Do not fry foods. Cook foods using healthy methods such as baking, boiling, grilling, and broiling instead.  Cook with heart-healthy oils, such as olive, canola, soybean, or sunflower oil. Meal planning    Eat a balanced diet that includes:  5 or more  servings of fruits and vegetables each day. At each meal, try to fill half of your plate with fruits and vegetables.  Up to 6-8 servings of whole grains each day.  Less than 6 oz of lean meat, poultry, or fish each day. A 3-oz serving of meat is about the same size as a deck of cards. One egg equals 1 oz.  2 servings of low-fat dairy each day.  A serving of nuts, seeds, or beans 5 times each week.  Heart-healthy fats. Healthy fats called Omega-3 fatty acids are found in foods such as flaxseeds and coldwater fish, like sardines, salmon, and mackerel.  Limit how much you eat of the following:  Canned or prepackaged foods.  Food that is high in trans fat, such as fried foods.  Food that is high in saturated fat, such as fatty meat.  Sweets, desserts, sugary drinks, and other foods with added sugar.  Full-fat dairy products.  Do not salt foods before eating.  Try to eat at least 2 vegetarian meals each week.  Eat more home-cooked food and less restaurant, buffet, and fast food.  When eating at a restaurant, ask that your food be prepared with less salt or no salt, if possible. What foods are recommended? The items listed may not be a complete list. Talk with your dietitian about what dietary choices are best for you. Grains  Whole-grain or whole-wheat bread. Whole-grain or whole-wheat pasta. Brown rice. [place]. Bulgur. Whole-grain and low-sodium cereals. Pita bread. Low-fat, low-sodium crackers. Whole-wheat flour tortillas. Vegetables   Fresh or frozen vegetables (raw, steamed, roasted, or grilled). Low-sodium or reduced-sodium tomato and vegetable juice. Low-sodium or reduced-sodium tomato sauce and tomato paste. Low-sodium or reduced-sodium canned vegetables. Fruits  All fresh, dried, or frozen fruit. Canned fruit in natural juice (without added sugar). Meat and other protein foods  Skinless chicken or [place]. Ground chicken or [place]. Pork with fat trimmed off. Fish and seafood. Egg whites. Dried beans, peas, or lentils. Unsalted nuts, nut butters, and seeds. Unsalted canned beans. Lean cuts of beef with fat trimmed off. Low-sodium, lean deli meat. Dairy  Low-fat (1%) or fat-free (skim) milk. Fat-free, low-fat, or reduced-fat cheeses. Nonfat, low-sodium ricotta or cottage cheese. Low-fat or nonfat yogurt. Low-fat, low-sodium cheese. Fats and oils  Soft margarine without trans fats. Vegetable oil. Low-fat, reduced-fat, or light mayonnaise and salad dressings (reduced-sodium). Canola, safflower, olive, soybean, and sunflower oils. Avocado. Seasoning and other foods  Herbs. Spices. Seasoning mixes without salt. Unsalted popcorn and pretzels. Fat-free sweets. What foods are not recommended? The items listed may not be a complete list. Talk with your dietitian about what dietary choices are best for you. Grains  Baked goods made with fat, such as croissants, muffins, or some breads. Dry pasta or rice meal packs. Vegetables  Creamed or fried vegetables. Vegetables in a cheese sauce. Regular canned vegetables (not low-sodium or reduced-sodium). Regular canned tomato sauce and paste (not low-sodium or reduced-sodium). Regular tomato and vegetable juice (not low-sodium or reduced-sodium). Angie Fava. Olives. Fruits  Canned fruit in a light or heavy syrup. Fried fruit. Fruit in cream or butter sauce. Meat and other protein foods  Fatty cuts of meat. Ribs. Fried meat. Berniece Salines. Sausage. Bologna and other processed lunch meats. Salami.  Fatback. Hotdogs. Bratwurst. Salted nuts and seeds. Canned beans with added salt. Canned or smoked fish. Whole eggs or egg yolks. Chicken or [place] with skin. Dairy  Whole or 2% milk, cream, and half-and-half. Whole or full-fat cream cheese. Whole-fat or  sweetened yogurt. Full-fat cheese. Nondairy creamers. Whipped toppings. Processed cheese and cheese spreads. Fats and oils  Butter. Stick margarine. Lard. Shortening. Ghee. Bacon fat. Tropical oils, such as coconut, palm kernel, or palm oil. Seasoning and other foods  Salted popcorn and pretzels. Onion salt, garlic salt, seasoned salt, table salt, and sea salt. Worcestershire sauce. Tartar sauce. Barbecue sauce. Teriyaki sauce. Soy sauce, including reduced-sodium. Steak sauce. Canned and packaged gravies. Fish sauce. Oyster sauce. Cocktail sauce. Horseradish that you find on the shelf. Ketchup. Mustard. Meat flavorings and tenderizers. Bouillon cubes. Hot sauce and Tabasco sauce. Premade or packaged marinades. Premade or packaged taco seasonings. Relishes. Regular salad dressings. Where to find more information:  National Heart, Lung, and [place]: https://wilson-eaton.com/  American Heart Association: www.heart.org Summary  The DASH eating plan is a healthy eating plan that has been shown to reduce high blood pressure (hypertension). It may also reduce your risk for type 2 diabetes, heart disease, and stroke.  With the DASH eating plan, you should limit salt (sodium) intake to 2,300 mg a day. If you have hypertension, you may need to reduce your sodium intake to 1,500 mg a day.  When on the DASH eating plan, aim to eat more fresh fruits and vegetables, whole grains, lean proteins, low-fat dairy, and heart-healthy fats.  Work with your health care provider or diet and nutrition specialist (dietitian) to adjust your eating plan to your individual calorie needs. This information is not intended to replace advice given to you by your health care  provider. Make sure you discuss any questions you have with your health care provider. Document Released: 04/23/2011 Document Revised: 04/27/2016 Document Reviewed: 04/27/2016 Elsevier Interactive Patient Education  2017 [employer].

## 2016-09-22 NOTE — Progress Notes (Signed)
Pre visit review using our clinic review tool, if applicable. No additional management support is needed unless otherwise documented below in the visit note. 

## 2016-09-22 NOTE — Progress Notes (Addendum)
Subjective:   [name] is a 76 y.o. female who presents for Medicare Annual (Subsequent) preventive examination.  The Patient was informed that the wellness visit is to identify future health risk and educate and initiate measures that can reduce risk for increased disease through the lifespan.    NO ROS; Medicare Wellness Visit  Describes health as good, fair or great? Fair  Has not been checking her BS periodically Mobility has slowed down due to fx March of this year  Walking with rolling walker   Does not use cpap as it does not work; plans to send this back Very HOH and left hearing aids at home.  TKR on right 07/2014  Preventive Screening -Counseling & Management  Mammogram; Jan 2018  Bone density  03/2015  -1.4  States she takes calcium and Vit d   meds;  Takes lantus at 2 am; up at hs States she is taking lasix She is up during the night and up at 5am Spouse worked 3rd shift and gets up late in the am  Smoking history no  Second Hand Smoke status; No Smokers in the home -no ETOH - no   RISK FACTORS Diet Breakfast; toast Lunch; [name];  Supper bring in or go out  Dependent on food in the package  Eats a lot of salad Both are thoughtful about the diet; but admits improvements are difficult. Does eat a cafeteria; Recommended at least breakfast drink with balanced nutrients;  Add green vegetables and noon sugary fruits   Regular exercise  Encouraged to exercise and get up and go more    Has exercise but plans to start PT once back from [place]   Cardiac Risk Factors:  Brother had HD  Advanced aged  >11 in women Hyperlipidemia - HDL 31' Tri 86 Diabetes;rechecking today  Family History   Obesity yes  Fall risk just fell due to pain at hs;  States she is up at hs due to pain in back,states she fell while up waiting now for shots to back which could inadvertently increase her BS   Given education on "Fall Prevention in the Home" for more safety  tips the patient can apply as appropriate.  Long term goal is to "age in place"  From [place]; Family lives in [place] and leaving to go there for grand dtr's graduation soon   Mobility of Functional changes this year?  Yes;  Back discomfort as well; plans on PT upon their return from [place]  Has one level home;  She does her own bath but spouse helps Spouse checks her feet every day Requested he start to assist her with compression stocking she had prior to going to the hospital  Has Commode chair in shower  Mental Health:  Any emotional problems? Anxious, depressed, irritable, sad or blue? no Denies feeling depressed or hopeless; voices pleasure in daily life How many social activities have you been engaged in within the last 2 weeks? no Who would help you with chores; illness; shopping other? Her spouse  Hearing Screening Comments: Has hearing aids but did not wear them today Vision Screening Comments: Scheduled for end of June    Activities of Daily Living - See functional screen   Cognitive testing; Ad8 score; 0 or less than 2  MMSE deferred or completed if AD8 + 2 issues  Advanced Directives will review [place] advanced directives; will review as theirs may need to be updated  Given Copy of [place] AD  Patient Care Team: Vanessa Kern, DO as PCP - General (Family Medicine) Vanessa Broad, MD as Consulting Physician (Physical Medicine and Rehabilitation)   Immunization History  Administered Date(s) Administered  . Influenza Split 02/20/2011, 01/25/2012  . Influenza Whole 02/16/1999, 02/25/2007, 03/07/2009, 02/14/2010  . Influenza, High Dose Seasonal PF 02/17/2016  . Influenza,inj,Quad PF,36+ Mos 02/01/2013, 02/19/2014, 01/30/2015  . Pneumococcal Conjugate-13 01/30/2015  . Pneumococcal Polysaccharide-23 10/11/2013  . Td 05/18/2005  . Tdap 10/21/2015  . Zoster 02/20/2011   Required Immunizations needed today  Screening test up to date or reviewed for plan of  completion There are no preventive care reminders to display for this patient.   Diabetic Foot Exam - Simple   Simple Foot Form Diabetic Foot exam was performed with the following findings:  Yes 09/22/2016  9:21 AM  Visual Inspection No deformities, no ulcerations, no other skin breakdown bilaterally:  Yes Sensation Testing Intact to touch and monofilament testing bilaterally:  Yes Pulse Check Posterior Tibialis and Dorsalis pulse intact bilaterally:  Yes Comments Feel warm and dry; some mild edema in right and 1+ in left Toes blanch, toenails small and spouse cuts her toenails Pulses 1+     Eye exam has been postponed due to recent fall and will be scheduled for June   Cardiac Risk Factors include: advanced age (>56mn, >>102women)     Objective:     Vitals: BP 124/70   Pulse 73   Temp 97.8 F (36.6 C) (Oral)   Ht '5\' 1"'  (1.549 m)   Wt 196 lb 4.8 oz (89 kg)   BMI 37.09 kg/m   Body mass index is 37.09 kg/m.   Tobacco History  Smoking Status  . Never Smoker  Smokeless Tobacco  . Never Used     Counseling given: Yes   Past Medical History:  Diagnosis Date  . AK (actinic keratosis)   . Anxiety    occasional  . [place] ([facility]    R breast, s/p radiation 34Gy/189f12/13/11-12/19/11  . CTS (carpal tunnel syndrome)    Left  . [place]  . Diastolic [place]F ([facility] [place]   Dx w/ hospitalization 06/2015 for respiratory failure  . Facial asymmetry, acquired 08/20/2016   From injury  . Falls frequently    "can not pick left leg up"  . Foot fracture   . Hearing loss    bilateral hearing aides  . History of environmental allergies   . History of kidney stones 20 years ago  . Hypertension   . MDD (major depressive disorder)   . No blood products ([place])  09/11/2013  . Osteoarthritis    hx shoulder, knee, back, wrist pain; saw Dr. AlWynelle Link . RESTLESS LEGS SYNDROME 05/05/2007   Qualifier: Diagnosis of  By: ClGwenette GreetD, KeArmando Reichert . RLS (restless legs  syndrome)   . Sleep apnea    Past Surgical History:  Procedure Laterality Date  . APPENDECTOMY  age 76. BREAST SURGERY  2012   rt breast lumpectomy  . flex signoidoscopy    . INCISION AND DRAINAGE PERIRECTAL ABSCESS    . JOINT REPLACEMENT Right 2006   knee  . TOTAL KNEE ARTHROPLASTY Left 08/06/2014   Procedure: LEFT TOTAL KNEE ARTHROPLASTY;  Surgeon: FrGaynelle ArabianMD;  Location: [facility];  Service: Orthopedics;  Laterality: Left;  . TUBAL LIGATION  1979   Family History  Problem Relation Age of Onset  . Alcohol abuse Father   . Cancer Sister  breast  . Heart disease Paternal Grandfather   . Diabetes Other   . Obesity Other   . Heart disease Brother     Valve   History  Sexual Activity  . Sexual activity: Yes    Outpatient Encounter Prescriptions as of 09/22/2016  Medication Sig  . albuterol (PROVENTIL) (2.5 MG/3ML) 0.083% nebulizer solution Take 3 mLs (2.5 mg total) by nebulization every 6 (six) hours as needed for wheezing or shortness of breath.  Marland Kitchen amLODipine (NORVASC) 5 MG tablet Take 1 tablet (5 mg total) by mouth daily.  Marland Kitchen anastrozole (ARIMIDEX) 1 MG tablet TAKE ONE TABLET BY MOUTH ONCE DAILY  . carvedilol (COREG) 3.125 MG tablet Take 1 tablet (3.125 mg total) by mouth 2 (two) times daily with a meal.  . cephALEXin (KEFLEX) 500 MG capsule Take 1 capsule (500 mg total) by mouth 3 (three) times daily.  . cholecalciferol (VITAMIN D) 1000 units tablet Take 1,000 Units by mouth 2 (two) times daily.  . DULoxetine (CYMBALTA) 20 MG capsule Take 1 capsule (20 mg total) by mouth daily. (Patient taking differently: Take 20 mg by mouth daily as needed. Mood)  . furosemide (LASIX) 40 MG tablet Take 1 tablet (40 mg total) by mouth daily.  . Insulin Glargine (LANTUS SOLOSTAR) 100 UNIT/ML Solostar Pen Inject 16 Units into the skin daily at 10 pm.  . Insulin Pen Needle (BD PEN NEEDLE NANO U/F) 32G X 4 MM MISC Use as directed once a day  . lisinopril (PRINIVIL,ZESTRIL) 20 MG tablet  Take 1 tablet (20 mg total) by mouth daily.  . metFORMIN (GLUCOPHAGE) 1000 MG tablet Take 1 tablet (1,000 mg total) by mouth 2 (two) times daily.  . methocarbamol (ROBAXIN) 500 MG tablet Take 500 mg by mouth every 6 (six) hours as needed for muscle spasms. Reported on 09/25/2015  . oxyCODONE (OXY IR/ROXICODONE) 5 MG immediate release tablet Take 1 tablet (5 mg total) by mouth every 6 (six) hours as needed for moderate pain.  . polyethylene glycol (MIRALAX / GLYCOLAX) packet Take 17 g by mouth daily as needed for mild constipation.  . potassium chloride (K-DUR) 10 MEQ tablet Take 1 tablet (10 mEq total) by mouth daily.  . pramipexole (MIRAPEX) 1 MG tablet TAKE ONE TABLET BY MOUTH TWICE DAILY  . Pyridoxine HCl (VITAMIN B-6 PO) Take by mouth.  . vitamin B-12 100 MCG tablet Take 1 tablet (100 mcg total) by mouth daily.  . Blood Glucose Monitoring Suppl ([place]) w/Device KIT 1 kit by Does not apply route once.  Marland Kitchen glucose blood (ONETOUCH VERIO) test strip Use as instructed to check blood sugar three times a day  . Lancets (ONETOUCH ULTRASOFT) lancets Use as instructed to check blood sugar   No facility-administered encounter medications on file as of 09/22/2016.     Activities of Daily Living In your present state of health, do you have any difficulty performing the following activities: 09/22/2016 08/01/2016  Hearing? Tempie Donning  Vision? N N  Difficulty concentrating or making decisions? N N  Walking or climbing stairs? Y Y  Dressing or bathing? Y N  Doing errands, shopping? Tempie Donning  Preparing Food and eating ? N -  Using the Toilet? Y -  In the past six months, have you accidently leaked urine? Y -  Do you have problems with loss of bowel control? N -  Managing your Medications? N -  Managing your Finances? N -  Housekeeping or managing your Housekeeping? N -  Some  recent data might be hidden    Patient Care Team: Vanessa Kern, DO as PCP - General (Family Medicine) Vanessa Broad,  MD as Consulting Physician (Physical Medicine and Rehabilitation)    Assessment:     Exercise Activities and Dietary recommendations Limited now due to recent fx and back pain. Plans to have injection to back and then will start PT as well  Current Exercise Habits: Home exercise routine, Intensity: Mild, Exercise limited by: orthopedic condition(s);neurologic condition(s)  Given information on community fall classes;  States her insurer does not pay for silver sneakers.  Goals    . Stay well and out of the hospital. (pt-stated)      Fall Risk Fall Risk  09/22/2016 07/08/2016 09/25/2015 07/24/2015 07/16/2015  Falls in the past year? Yes Yes Yes No No  Number falls in past yr: 1 - 1 - -  Risk for fall due to : - - History of fall(s);Impaired balance/gait - History of fall(s);Impaired balance/gait;Impaired mobility;Medication side effect  Follow up Education provided - Falls evaluation completed;Education provided;Falls prevention discussed - -   Depression Screen PHQ 2/9 Scores 09/22/2016 07/08/2016 09/25/2015 08/01/2015  PHQ - 2 Score 0 0 2 3  PHQ- 9 Score - - 4 14     Cognitive Function no issues        Immunization History  Administered Date(s) Administered  . Influenza Split 02/20/2011, 01/25/2012  . Influenza Whole 02/16/1999, 02/25/2007, 03/07/2009, 02/14/2010  . Influenza, High Dose Seasonal PF 02/17/2016  . Influenza,inj,Quad PF,36+ Mos 02/01/2013, 02/19/2014, 01/30/2015  . Pneumococcal Conjugate-13 01/30/2015  . Pneumococcal Polysaccharide-23 10/11/2013  . Td 05/18/2005  . Tdap 10/21/2015  . Zoster 02/20/2011   Screening Tests Health Maintenance  Topic Date Due  . OPHTHALMOLOGY EXAM  11/14/2016 (Originally 08/25/2016)  . INFLUENZA VACCINE  12/16/2016  . HEMOGLOBIN A1C  02/01/2017  . FOOT EXAM  09/22/2017  . TETANUS/TDAP  10/20/2025  . DEXA SCAN  Completed  . PNA vac Low Risk Adult  Completed      Plan:      PCP Notes  Health Maintenance Will start wearing  compression stocking Will go to [employer] for diabetic shoes and they will send in request for RX  to Dr. Maudie Mercury  Given info on health diet; as well as foot care  Discussed diet and adding nutritional value to meals via greens and other vegetables or nutritional shakes  Educated regarding the DASH diet   Abnormal Screens Weight but not likely to impact at this time/ does not like to cook;  Spouse cooking some and bringing food in .   Referrals none Patient concerns; none  Nurse Concerns;  To fup with PT and back pain and then may be more  Complaint with diet and cook more   Given information on community  Classes for preventing falls as her insurance does not pay for the silver sneaker class    Next PCP apt as needed and seen today     I have personally reviewed and noted the following in the patient's chart:   . Medical and social history . Use of alcohol, tobacco or illicit drugs  . Current medications and supplements . Functional ability and status . Nutritional status . Physical activity . Advanced directives . List of other physicians . Hospitalizations, surgeries, and ER visits in previous 12 months . Vitals . Screenings to include cognitive, depression, and falls . Referrals and appointments  In addition, I have reviewed and discussed with patient certain preventive protocols,  quality metrics, and best practice recommendations. A written personalized care plan for preventive services as well as general preventive health recommendations were provided to patient.     Wynetta Fines, RN  09/22/2016

## 2016-09-22 NOTE — Progress Notes (Signed)
HPI:  [name] is a pleasant 76 yo with a PMH significant for Obesity, IDDM, CdCHF, HTN, OSA, Hx Breast Ca on Arimidex, poor compliance and Chronic back pain here for follow up. She had a fall last month w/ unfortunately fx of knee prosthesis with hospital admission. She did not follow up here afterwards as was advised. Sees ortho for knee and back issues.Her carvedilol was increased and norvas added for her blood pressure in the hospital.  Reports doing "great" considering recent event. Back weight bearing. Using walker. No further falls. Fasting for labs and seeing Manuela Schwartz for [employer] visit today. Not check BS because she reports her meter is broken. Not using CPAP as does not like the mask. Denies: further falls, weakness, malaise, dizziness, fevers, CP, SOB, subjective low BS.  ROS: See pertinent positives and negatives per HPI.  Past Medical History:  Diagnosis Date  . AK (actinic keratosis)   . Anxiety    occasional  . Breast cancer ([place])    R breast, s/p radiation 34Gy/73f 04/29/10-05/05/10  . CTS (carpal tunnel syndrome)    Left  . Diabetes ([place]   . Diastolic CHF ([place]    Dx w/ hospitalization 06/2015 for respiratory failure  . Facial asymmetry, acquired 08/20/2016   From injury  . Falls frequently    "can not pick left leg up"  . Foot fracture   . Hearing loss    bilateral hearing aides  . History of environmental allergies   . History of kidney stones 20 years ago  . Hypertension   . MDD (major depressive disorder)   . No blood products (Jehovah Witness)  09/11/2013  . Osteoarthritis    hx shoulder, knee, back, wrist pain; saw Dr. AWynelle Link  . RESTLESS LEGS SYNDROME 05/05/2007   Qualifier: Diagnosis of  By: CGwenette GreetMD, KArmando Reichert  . RLS (restless legs syndrome)   . Sleep apnea     Past Surgical History:  Procedure Laterality Date  . APPENDECTOMY  age 76 . BREAST SURGERY  2012   rt breast lumpectomy  . flex signoidoscopy    . INCISION AND DRAINAGE  PERIRECTAL ABSCESS    . JOINT REPLACEMENT Right 2006   knee  . TOTAL KNEE ARTHROPLASTY Left 08/06/2014   Procedure: LEFT TOTAL KNEE ARTHROPLASTY;  Surgeon: FGaynelle Arabian MD;  Location: [facility];  Service: Orthopedics;  Laterality: Left;  . TUBAL LIGATION  1979    Family History  Problem Relation Age of Onset  . Alcohol abuse Father   . Cancer Sister     breast  . Heart disease Paternal Grandfather   . Diabetes Other   . Obesity Other   . Heart disease Brother     Valve    Social History   Social History  . Marital status: Married    Spouse name: N/A  . Number of children: 3  . Years of education: N/A   Social History Main Topics  . Smoking status: Never Smoker  . Smokeless tobacco: Never Used  . Alcohol use No  . Drug use: No  . Sexual activity: Yes   Other Topics Concern  . None   Social History Narrative         Home Situation: lives with husband and granddaughter      Spiritual Beliefs: Jehovah Witness - no blood products                    Current Outpatient Prescriptions:  .  albuterol (PROVENTIL) (2.5 MG/3ML) 0.083% nebulizer solution, Take 3 mLs (2.5 mg total) by nebulization every 6 (six) hours as needed for wheezing or shortness of breath., Disp: 75 mL, Rfl: 12 .  amLODipine (NORVASC) 5 MG tablet, Take 1 tablet (5 mg total) by mouth daily., Disp: 30 tablet, Rfl: 0 .  anastrozole (ARIMIDEX) 1 MG tablet, TAKE ONE TABLET BY MOUTH ONCE DAILY, Disp: 30 tablet, Rfl: 11 .  carvedilol (COREG) 3.125 MG tablet, Take 1 tablet (3.125 mg total) by mouth 2 (two) times daily with a meal., Disp: 180 tablet, Rfl: 1 .  cephALEXin (KEFLEX) 500 MG capsule, Take 1 capsule (500 mg total) by mouth 3 (three) times daily., Disp: 9 capsule, Rfl: 0 .  cholecalciferol (VITAMIN D) 1000 units tablet, Take 1,000 Units by mouth 2 (two) times daily., Disp: , Rfl:  .  DULoxetine (CYMBALTA) 20 MG capsule, Take 1 capsule (20 mg total) by mouth daily. (Patient taking differently: Take 20  mg by mouth daily as needed. Mood), Disp: 30 capsule, Rfl: 3 .  furosemide (LASIX) 40 MG tablet, Take 1 tablet (40 mg total) by mouth daily., Disp: 90 tablet, Rfl: 1 .  Insulin Glargine (LANTUS SOLOSTAR) 100 UNIT/ML Solostar Pen, Inject 16 Units into the skin daily at 10 pm., Disp: 5 pen, Rfl: 3 .  Insulin Pen Needle (BD PEN NEEDLE NANO U/F) 32G X 4 MM MISC, Use as directed once a day, Disp: 100 each, Rfl: 1 .  lisinopril (PRINIVIL,ZESTRIL) 20 MG tablet, Take 1 tablet (20 mg total) by mouth daily., Disp: 90 tablet, Rfl: 1 .  metFORMIN (GLUCOPHAGE) 1000 MG tablet, Take 1 tablet (1,000 mg total) by mouth 2 (two) times daily., Disp: 180 tablet, Rfl: 2 .  methocarbamol (ROBAXIN) 500 MG tablet, Take 500 mg by mouth every 6 (six) hours as needed for muscle spasms. Reported on 09/25/2015, Disp: , Rfl:  .  oxyCODONE (OXY IR/ROXICODONE) 5 MG immediate release tablet, Take 1 tablet (5 mg total) by mouth every 6 (six) hours as needed for moderate pain., Disp: 30 tablet, Rfl: 0 .  polyethylene glycol (MIRALAX / GLYCOLAX) packet, Take 17 g by mouth daily as needed for mild constipation., Disp: 14 each, Rfl: 0 .  potassium chloride (K-DUR) 10 MEQ tablet, Take 1 tablet (10 mEq total) by mouth daily., Disp: 30 tablet, Rfl: 11 .  pramipexole (MIRAPEX) 1 MG tablet, TAKE ONE TABLET BY MOUTH TWICE DAILY, Disp: 180 tablet, Rfl: 1 .  Pyridoxine HCl (VITAMIN B-6 PO), Take by mouth., Disp: , Rfl:  .  vitamin B-12 100 MCG tablet, Take 1 tablet (100 mcg total) by mouth daily., Disp: 30 tablet, Rfl: 0 .  Blood Glucose Monitoring Suppl (ONETOUCH VERIO FLEX SYSTEM) w/Device KIT, 1 kit by Does not apply route once., Disp: 1 kit, Rfl: 0 .  glucose blood (ONETOUCH VERIO) test strip, Use as instructed to check blood sugar three times a day, Disp: 100 each, Rfl: 12 .  Lancets (ONETOUCH ULTRASOFT) lancets, Use as instructed to check blood sugar, Disp: 100 each, Rfl: 11  EXAM:  Vitals:   09/22/16 0836  BP: 124/70  Pulse: 73    Temp: 97.8 F (36.6 C)    Body mass index is 37.09 kg/m.  GENERAL: vitals reviewed and listed above, alert, oriented, appears well hydrated and in no acute distress  HEENT: atraumatic, conjunttiva clear, no obvious abnormalities on inspection of external nose and ears  NECK: no obvious masses on inspection  LUNGS: clear to auscultation bilaterally, no wheezes,  rales or rhonchi, good air movement  CV: HRRR, no peripheral edema  MS: moves all extremities without noticeable abnormality  PSYCH: pleasant and cooperative, no obvious depression or anxiety  ASSESSMENT AND PLAN:  Discussed the following assessment and plan:  Essential hypertension - Plan: CBC, Basic metabolic panel -BP well controlled today -labs -cont current tx  Type 2 diabetes mellitus with diabetic neuropathy, with long-term current use of insulin (HCC) -assistant to provide new meter, health coach to review at [place] -advised again of importance good glucose control -advised to monitor home BS and bring log to appointment  Malignant neoplasm of upper-outer quadrant of right female breast, unspecified estrogen receptor status ([place]) -sees oncology for management  Chronic diastolic congestive heart failure (HCC) -on lasix, stable  Mild episode of recurrent major depressive disorder ([place]) -mood improved on celexa, continue  Fracture of prosthetic knee, initial encounter ([place]) -seeing ortho  Class 2 severe obesity due to excess calories with serious comorbidity and body mass index (BMI) of 36.0 to 36.9 in adult ([place]) -lifestyle recs  Hyperlipidemia, unspecified hyperlipidemia type - Plan: Lipid panel -labs today  -Patient advised to return or notify a doctor immediately if symptoms worsen or persist or new concerns arise.  Patient Instructions   BEFORE YOU LEAVE: -new glucose meter -labs -follow up: 3 months  Please follow up with your sleep specialist.  Check fasting blood sugars several days  per week and please keep a blood sugar log.  FOR YOUR DIABETES:  _0  Eat a healthy clean diet with avoidance of (less then 1 serving per week) processed foods, white starches, red meat, fast foods and sweets and consisting of: * 5-9 servings of fresh or frozen fruits and vegetables (not corn or potatoes, not dried or canned) *nuts and seeds, beans *olives and olive oil *small portions of lean meats such as fish and white chicken  *small portions of whole grains  _1  Get AT LEAST 150 minutes of cardiovascular exercise per week - 30 minutes per day is best of sustained sweaty exercise.  _2  Take all of your medications every day as directed by your doctor. Call your doctor immediately if you have any questions about your medications or are running low.  _3  Check your blood sugar often and when you feel unwell and keep a log to bring to all health appointments. FASTING: before you eat anything in the morning POSTPRANDIAL: 1-2 hours after a meal  _4  If any low blood sugars < 70, eat a snack and call your doctor immediately.  _5  See an eye doctor every year and fax your diabetic eye exam to our office.  Fax: [phone]    Ms. Eissler , Thank you for taking time to come for your Medicare Wellness Visit. I appreciate your ongoing commitment to your health goals. Please review the following plan we discussed and let me know if I can assist you in the future.   Have Dr. Maudie Mercury Rx diabetic shoes   When she gets back from [place];  She will be getting 3 weeks of exercise to strengthen her leg   Will check BS in the am and try to take 3 times a week per Dr. Maudie Mercury  Try to get 1200 mg of Calcium and Vit d 800 to 1000 u per day [employer] Information Description of Services Cost  A Matter of Balance Class locations vary. Call [place] on Aging for more information.  [email or web address]-[email or web address]/ [phone] 8-Session program addressing  the fear of falling  and increasing activity levels of older adults Free to minimal cost  A.C.T. By [employer] [address] [place], [place] [address].  BetaBlues.dk [phone]  Personal training, gym, classes including Silver Sneakers* and ACTion for Aging Adults Fee-based  A.H.O.Y. (Add Health to [place]) Airs on Time [employer] 13, M-F at [place]: [employer],  [place] [facility] [place],  [place], [place] [facility], [address] [facility], [place], [place], [place] [address]  [place] Location: Sharrell Ku. [employer] Hermleigh Deans      [phone]  [phone]  [phone]  [phone]  [phone]  [phone]  [phone]  [phone]  [phone]  [phone]    [phone] A total-body conditioning class for adults 70 and older; designed to increase muscular strength, endurance, range of movement, flexibility, balance, agility and coordination Free  [facility], [place] [address] [address]      [address]      [phone]      Pilate's class for individualsreturning to exercise after an injury, before or after surgery or for individuals with complex musculoskeletal issues; designed to improve strength, balance , flexibility      $15/class  [place] [address] [place], [place] [address] [email or web address] [place] classes for beginners to advanced Blanca Benson, [place] [address] Seniorcenter_0 [email or web address] Castine Chair Exercises Free, ages 39 and older; Ages 72-59 fee based  Marvia Pickles, [facility] [address] [place], [place] [address] Seniorcenter_1 .Beverlee Nims [phone]  A.H.O.Y. Tai Chi Fee-based Donation based or free  [place] Class locations vary.  Call or email Angela Burke or view website for more information. [email or web address] .com [email or web address].com.cy.html [phone] Ongoing classes at local YMCAs and gyms Fee-based  Silver Sneakers A.C.T. By [place] [employer]: [place] [phone]  [phone] Classes designed for older adults who want to improve their strength, flexibility, balance and endurance.   Silver sneakers is covered by some insurance plans and includes a fitness center membership at participating locations. Find out more by calling [phone] or visiting www.[email or web address] Covered by some insurance plans  [facility] [place] [phone] A.H.O.Y., fitness room, personal training, fitness classes for injury prevention, strength, balance, flexibility, water fitness classes Ages 55+: $94 for 6 months; Ages 21-54: $87 for 6 months  Tai Chi for Everybody [facility] [address] [place],  [address] Taichiforeverybody_3 .Patsi Sears [phone] Tai Chi classes for beginners to advanced; geared for seniors Donation Based      [employer] (Helpling Others Participate in Exercise     Loyal Gambler. Rosana Hoes,  PhD, [place] [email or web address] [place]     [phone]     A comprehensive  fitness program for adults.  The program paris senior-level undergraduates Kinesiology students with adults who desire to learn how to exercise safely.  Includes a structural exercise class focusing on functional fitnesss     $100/semester in fall and spring; $75 in summer (no trainers)    *Silver Sneakers is covered by some Personal assistant and includes a  Radio producer at participating locations.  Find out more by calling [phone] or visiting [email or web address]  For additional health and human services resources for senior adults, please contact SeniorLine at [phone] in [place] and [place] at [phone] in all other areas.  Summary: Preventive Care for Adults  A healthy lifestyle and preventive care can promote health and wellness. Preventive health guidelines for adults include the following key practices.  . A routine yearly physical is a good way to check with your health care provider about your health and preventive screening. It is a chance to share any concerns and updates on your health and to receive a thorough exam.  . Visit your dentist for a routine exam and preventive care every 6 months. Brush your teeth twice a day and floss once a day. Good oral hygiene prevents tooth decay and gum disease.  . The frequency of eye exams is based on your age, health, family medical history, use  of contact lenses, and other factors. Follow your health care provider's ecommendations for frequency of eye exams.  . Eat a healthy diet. Foods like vegetables, fruits, whole grains, low-fat dairy products, and lean protein foods contain the nutrients you need without too many calories. Decrease your intake of foods high in solid fats, added sugars, and salt. Eat the right amount of calories for you. Get information about a proper diet from your health care provider, if  necessary.  . Regular physical exercise is one of the most important things you can do for your health. Most adults should get at least 150 minutes of moderate-intensity exercise (any activity that increases your heart rate and causes you to sweat) each week. In addition, most adults need muscle-strengthening exercises on 2 or more days a week.  Silver Sneakers may be a benefit available to you. To determine eligibility, you may visit the website: www.silversneakers.com or contact program at [phone] Mon-Fri between 8AM-8PM.   . Maintain a healthy weight. The body mass index (BMI) is a screening tool to identify possible weight problems. It provides an estimate of body fat based on height and weight. Your health care provider can find your BMI and can help you achieve or maintain a healthy weight.   For adults 20 years and older: ? A BMI below 18.5 is considered underweight. ? A BMI of 18.5 to 24.9 is normal. ? A BMI of 27 to 28 is considered normal by the Institutes of Health  ? A BMI of 30 and above is considered obese.   . Maintain normal blood lipids and cholesterol levels by exercising and minimizing your intake of saturated fat. Eat a balanced diet with plenty of fruit and vegetables. Blood tests for lipids and cholesterol should begin at age 3 and be repeated every 5 years. If your lipid or cholesterol levels are high, you are over 50, or you are at high risk for heart disease, you may need your cholesterol levels checked more frequently. Ongoing high lipid and cholesterol levels should be treated with medicines if diet and exercise are not working.  . If you smoke, find out from your health care provider how  to quit. If you do not use tobacco, please do not start.  . If you choose to drink alcohol, please do not consume more than one drink for women and 2 for men.  One drink is considered to be 12 ounces (355 mL) of beer, 5 ounces (148 mL) of wine, or 1.5 ounces (44 mL) of  liquor. Moderation of alcohol intake to this level decreases your risk of breast cancer and liver damage.   . If you are 33-21 years old, ask your health care provider if you should take aspirin to prevent strokes.  . Use sunscreen. Apply sunscreen liberally and repeatedly throughout the day. You should seek shade when your shadow is shorter than you. Protect yourself by wearing long sleeves, pants, a wide-brimmed hat, and sunglasses year round, whenever you are outdoors.  . Once a month, do a whole body skin exam, using a mirror to look at the skin on your back. Tell your health care provider of new moles, moles that have irregular borders, moles that are larger than a pencil eraser, or moles that have changed in shape or color.  Last, if you have completed an Advanced Directive; please bring a copy and review with your physician and then we will scan to the medical record      These are the goals we discussed: Goals    . Stay well and out of the hospital. (pt-stated)       This is a list of the screening recommended for you and due dates:  Health Maintenance  Topic Date Due  . Complete foot exam   07/25/2016  . Eye exam for diabetics  08/25/2016  . Flu Shot  12/16/2016  . Hemoglobin A1C  02/01/2017  . Tetanus Vaccine  10/20/2025  . DEXA scan (bone density measurement)  Completed  . Pneumonia vaccines  Completed     _0  Take good care of your feet and keep them soft and callus free. Check your feet daily and wear comfortable shoes. See your doctor immediately if you have any cuts, calluses or wounds on your feet.  Preventing Falls and Fractures  Falls can be very serious, especially for older adults or people with osteoporosis  Falls can be caused by:  Tripping or slipping  Slow reflexes  Balance problems  Reduced muscle strength  Poor vision or a recent change in prescription  Illness and some medications (especially blood pressure pills, diuretics, heart  medicines, muscle relaxants and sleep medications)  Drinking alcohol  To prevent falls outdoors:  Use a can or walker if needed  Wear rubber-soled shoes so you don't slip  DO NOT buy "shape up" shoes with rocker bottom soles if you have balance problems.  The thick soles and shape make it more difficult to keep your balance.  Put kitty litter or salt on icy sidewalks  Walk on the grass if the sidewalks are slick  Avoid walking on uneven ground whenever possible  T prevent falls indoors:  Keep rooms clutter-free, especially hallways, stairs and paths to light switches  Remove throw rugs  Install night lights, especially to and in the bathroom  Turn on lights before going downstairs  Keep a flashlight next to your bed  Buy a cordless phone to keep with you instead of jumping up to answer the phone  Install grab bars in the bathroom near the shower and toilet  Install rails on both sides of the stairs.  Make sure the stairs are well lit  Wear  slippers with non-skid soles.  Do not walk around in stockings or socks  Balance problems and dizziness are not a normal part of growing older.  If you begin having balance problems or dizziness see your doctor.  Physical Therapy can help you with many balance problems, strengthening hip and leg muscles and with gait training.  To keep your bones healthy make sure you are getting enough calcium and Vitamin D each day.  Ask your doctor or pharmacist about supplements.  Regular weight-bearing exercise like walking, lifting weights or dancing can help strengthen bones and prevent osteoporosis.   Health Maintenance, Female Adopting a healthy lifestyle and getting preventive care can go a long way to promote health and wellness. Talk with your health care provider about what schedule of regular examinations is right for you. This is a good chance for you to check in with your provider about disease prevention and staying healthy. In between  checkups, there are plenty of things you can do on your own. Experts have done a lot of research about which lifestyle changes and preventive measures are most likely to keep you healthy. Ask your health care provider for more information. Weight and diet Eat a healthy diet  Be sure to include plenty of vegetables, fruits, low-fat dairy products, and lean protein.  Do not eat a lot of foods high in solid fats, added sugars, or salt.  Get regular exercise. This is one of the most important things you can do for your health.  Most adults should exercise for at least 150 minutes each week. The exercise should increase your heart rate and make you sweat (moderate-intensity exercise).  Most adults should also do strengthening exercises at least twice a week. This is in addition to the moderate-intensity exercise. Maintain a healthy weight  Body mass index (BMI) is a measurement that can be used to identify possible weight problems. It estimates body fat based on height and weight. Your health care provider can help determine your BMI and help you achieve or maintain a healthy weight.  For females 47 years of age and older:  A BMI below 18.5 is considered underweight.  A BMI of 18.5 to 24.9 is normal.  A BMI of 25 to 29.9 is considered overweight.  A BMI of 30 and above is considered obese. Watch levels of cholesterol and blood lipids  You should start having your blood tested for lipids and cholesterol at 78 years of age, then have this test every 5 years.  You may need to have your cholesterol levels checked more often if:  Your lipid or cholesterol levels are high.  You are older than 78 years of age.  You are at high risk for heart disease. Cancer screening Lung Cancer  Lung cancer screening is recommended for adults 68-34 years old who are at high risk for lung cancer because of a history of smoking.  A yearly low-dose CT scan of the lungs is recommended for people  who:  Currently smoke.  Have quit within the past 15 years.  Have at least a 30-pack-year history of smoking. A pack year is smoking an average of one pack of cigarettes a day for 1 year.  Yearly screening should continue until it has been 15 years since you quit.  Yearly screening should stop if you develop a health problem that would prevent you from having lung cancer treatment. Breast Cancer  Practice breast self-awareness. This means understanding how your breasts normally appear and feel.  It also means doing regular breast self-exams. Let your health care provider know about any changes, no matter how small.  If you are in your 20s or 30s, you should have a clinical breast exam (CBE) by a health care provider every 1-3 years as part of a regular health exam.  If you are 4 or older, have a CBE every year. Also consider having a breast X-ray (mammogram) every year.  If you have a family history of breast cancer, talk to your health care provider about genetic screening.  If you are at high risk for breast cancer, talk to your health care provider about having an MRI and a mammogram every year.  Breast cancer gene (BRCA) assessment is recommended for women who have family members with BRCA-related cancers. BRCA-related cancers include:  Breast.  Ovarian.  Tubal.  Peritoneal cancers.  Results of the assessment will determine the need for genetic counseling and BRCA1 and BRCA2 testing. Cervical Cancer  Your health care provider may recommend that you be screened regularly for cancer of the pelvic organs (ovaries, uterus, and vagina). This screening involves a pelvic examination, including checking for microscopic changes to the surface of your cervix (Pap test). You may be encouraged to have this screening done every 3 years, beginning at age 60.  For women ages 83-65, health care providers may recommend pelvic exams and Pap testing every 3 years, or they may recommend the  Pap and pelvic exam, combined with testing for human papilloma virus (HPV), every 5 years. Some types of HPV increase your risk of cervical cancer. Testing for HPV may also be done on women of any age with unclear Pap test results.  Other health care providers may not recommend any screening for nonpregnant women who are considered low risk for pelvic cancer and who do not have symptoms. Ask your health care provider if a screening pelvic exam is right for you.  If you have had past treatment for cervical cancer or a condition that could lead to cancer, you need Pap tests and screening for cancer for at least 20 years after your treatment. If Pap tests have been discontinued, your risk factors (such as having a new sexual partner) need to be reassessed to determine if screening should resume. Some women have medical problems that increase the chance of getting cervical cancer. In these cases, your health care provider may recommend more frequent screening and Pap tests. Colorectal Cancer  This type of cancer can be detected and often prevented.  Routine colorectal cancer screening usually begins at 78 years of age and continues through 78 years of age.  Your health care provider may recommend screening at an earlier age if you have risk factors for colon cancer.  Your health care provider may also recommend using home test kits to check for hidden blood in the stool.  A small camera at the end of a tube can be used to examine your colon directly (sigmoidoscopy or colonoscopy). This is done to check for the earliest forms of colorectal cancer.  Routine screening usually begins at age 61.  Direct examination of the colon should be repeated every 5-10 years through 78 years of age. However, you may need to be screened more often if early forms of precancerous polyps or small growths are found. Skin Cancer  Check your skin from head to toe regularly.  Tell your health care provider about any new  moles or changes in moles, especially if there is a change in  a mole's shape or color.  Also tell your health care provider if you have a mole that is larger than the size of a pencil eraser.  Always use sunscreen. Apply sunscreen liberally and repeatedly throughout the day.  Protect yourself by wearing long sleeves, pants, a wide-brimmed hat, and sunglasses whenever you are outside. Heart disease, diabetes, and high blood pressure  High blood pressure causes heart disease and increases the risk of stroke. High blood pressure is more likely to develop in:  People who have blood pressure in the high end of the normal range (130-139/85-89 mm Hg).  People who are overweight or obese.  People who are African American.  If you are 58-42 years of age, have your blood pressure checked every 3-5 years. If you are 30 years of age or older, have your blood pressure checked every year. You should have your blood pressure measured twice-once when you are at a hospital or clinic, and once when you are not at a hospital or clinic. Record the average of the two measurements. To check your blood pressure when you are not at a hospital or clinic, you can use:  An automated blood pressure machine at a pharmacy.  A home blood pressure monitor.  If you are between 56 years and 50 years old, ask your health care provider if you should take aspirin to prevent strokes.  Have regular diabetes screenings. This involves taking a blood sample to check your fasting blood sugar level.  If you are at a normal weight and have a low risk for diabetes, have this test once every three years after 78 years of age.  If you are overweight and have a high risk for diabetes, consider being tested at a younger age or more often. Preventing infection Hepatitis B  If you have a higher risk for hepatitis B, you should be screened for this virus. You are considered at high risk for hepatitis B if:  You were born in a country  where hepatitis B is common. Ask your health care provider which countries are considered high risk.  Your parents were born in a high-risk country, and you have not been immunized against hepatitis B (hepatitis B vaccine).  You have HIV or AIDS.  You use needles to inject street drugs.  You live with someone who has hepatitis B.  You have had sex with someone who has hepatitis B.  You get hemodialysis treatment.  You take certain medicines for conditions, including cancer, organ transplantation, and autoimmune conditions. Hepatitis C  Blood testing is recommended for:  Everyone born from [birth year] through 1967.  Anyone with known risk factors for hepatitis C. Sexually transmitted infections (STIs)  You should be screened for sexually transmitted infections (STIs) including gonorrhea and chlamydia if:  You are sexually active and are younger than 78 years of age.  You are older than 78 years of age and your health care provider tells you that you are at risk for this type of infection.  Your sexual activity has changed since you were last screened and you are at an increased risk for chlamydia or gonorrhea. Ask your health care provider if you are at risk.  If you do not have HIV, but are at risk, it may be recommended that you take a prescription medicine daily to prevent HIV infection. This is called pre-exposure prophylaxis (PrEP). You are considered at risk if:  You are sexually active and do not regularly use condoms or know the HIV  status of your partner(s).  You take drugs by injection.  You are sexually active with a partner who has HIV. Talk with your health care provider about whether you are at high risk of being infected with HIV. If you choose to begin PrEP, you should first be tested for HIV. You should then be tested every 3 months for as long as you are taking PrEP. Pregnancy  If you are premenopausal and you may become pregnant, ask your health care provider  about preconception counseling.  If you may become pregnant, take 400 to 800 micrograms (mcg) of folic acid every day.  If you want to prevent pregnancy, talk to your health care provider about birth control (contraception). Osteoporosis and menopause  Osteoporosis is a disease in which the bones lose minerals and strength with aging. This can result in serious bone fractures. Your risk for osteoporosis can be identified using a bone density scan.  If you are 54 years of age or older, or if you are at risk for osteoporosis and fractures, ask your health care provider if you should be screened.  Ask your health care provider whether you should take a calcium or vitamin D supplement to lower your risk for osteoporosis.  Menopause may have certain physical symptoms and risks.  Hormone replacement therapy may reduce some of these symptoms and risks. Talk to your health care provider about whether hormone replacement therapy is right for you. Follow these instructions at home:  Schedule regular health, dental, and eye exams.  Stay current with your immunizations.  Do not use any tobacco products including cigarettes, chewing tobacco, or electronic cigarettes.  If you are pregnant, do not drink alcohol.  If you are breastfeeding, limit how much and how often you drink alcohol.  Limit alcohol intake to no more than 1 drink per day for nonpregnant women. One drink equals 12 ounces of beer, 5 ounces of wine, or 1 ounces of hard liquor.  Do not use street drugs.  Do not share needles.  Ask your health care provider for help if you need support or information about quitting drugs.  Tell your health care provider if you often feel depressed.  Tell your health care provider if you have ever been abused or do not feel safe at home. This information is not intended to replace advice given to you by your health care provider. Make sure you discuss any questions you have with your health care  provider. Document Released: 11/17/2010 Document Revised: 10/10/2015 Document Reviewed: 02/05/2015 Elsevier Interactive Patient Education  2017 [place] DASH stands for "Dietary Approaches to Stop Hypertension." The DASH eating plan is a healthy eating plan that has been shown to reduce high blood pressure (hypertension). It may also reduce your risk for type 2 diabetes, heart disease, and stroke. The DASH eating plan may also help with weight loss. What are tips for following this plan? General guidelines   Avoid eating more than 2,300 mg (milligrams) of salt (sodium) a day. If you have hypertension, you may need to reduce your sodium intake to 1,500 mg a day.  Limit alcohol intake to no more than 1 drink a day for nonpregnant women and 2 drinks a day for men. One drink equals 12 oz of beer, 5 oz of wine, or 1 oz of hard liquor.  Work with your health care provider to maintain a healthy body weight or to lose weight. Ask what an ideal weight is for you.  Get at least 30 minutes of exercise that causes your heart to beat faster (aerobic exercise) most days of the week. Activities may include walking, swimming, or biking.  Work with your health care provider or diet and nutrition specialist (dietitian) to adjust your eating plan to your individual calorie needs. Reading food labels   Check food labels for the amount of sodium per serving. Choose foods with less than 5 percent of the Daily Value of sodium. Generally, foods with less than 300 mg of sodium per serving fit into this eating plan.  To find whole grains, look for the word "whole" as the first word in the ingredient list. Shopping   Buy products labeled as "low-sodium" or "no salt added."  Buy fresh foods. Avoid canned foods and premade or frozen meals. Cooking   Avoid adding salt when cooking. Use salt-free seasonings or herbs instead of table salt or sea salt. Check with your health care provider or  pharmacist before using salt substitutes.  Do not fry foods. Cook foods using healthy methods such as baking, boiling, grilling, and broiling instead.  Cook with heart-healthy oils, such as olive, canola, soybean, or sunflower oil. Meal planning    Eat a balanced diet that includes:  5 or more servings of fruits and vegetables each day. At each meal, try to fill half of your plate with fruits and vegetables.  Up to 6-8 servings of whole grains each day.  Less than 6 oz of lean meat, poultry, or fish each day. A 3-oz serving of meat is about the same size as a deck of cards. One egg equals 1 oz.  2 servings of low-fat dairy each day.  A serving of nuts, seeds, or beans 5 times each week.  Heart-healthy fats. Healthy fats called Omega-3 fatty acids are found in foods such as flaxseeds and coldwater fish, like sardines, salmon, and mackerel.  Limit how much you eat of the following:  Canned or prepackaged foods.  Food that is high in trans fat, such as fried foods.  Food that is high in saturated fat, such as fatty meat.  Sweets, desserts, sugary drinks, and other foods with added sugar.  Full-fat dairy products.  Do not salt foods before eating.  Try to eat at least 2 vegetarian meals each week.  Eat more home-cooked food and less restaurant, buffet, and fast food.  When eating at a restaurant, ask that your food be prepared with less salt or no salt, if possible. What foods are recommended? The items listed may not be a complete list. Talk with your dietitian about what dietary choices are best for you. Grains  Whole-grain or whole-wheat bread. Whole-grain or whole-wheat pasta. Brown rice. [place]. Bulgur. Whole-grain and low-sodium cereals. Pita bread. Low-fat, low-sodium crackers. Whole-wheat flour tortillas. Vegetables  Fresh or frozen vegetables (raw, steamed, roasted, or grilled). Low-sodium or reduced-sodium tomato and vegetable juice. Low-sodium or  reduced-sodium tomato sauce and tomato paste. Low-sodium or reduced-sodium canned vegetables. Fruits  All fresh, dried, or frozen fruit. Canned fruit in natural juice (without added sugar). Meat and other protein foods  Skinless chicken or [place]. Ground chicken or [place]. Pork with fat trimmed off. Fish and seafood. Egg whites. Dried beans, peas, or lentils. Unsalted nuts, nut butters, and seeds. Unsalted canned beans. Lean cuts of beef with fat trimmed off. Low-sodium, lean deli meat. Dairy  Low-fat (1%) or fat-free (skim) milk. Fat-free, low-fat, or reduced-fat cheeses. Nonfat, low-sodium ricotta or cottage cheese. Low-fat or nonfat yogurt.  Low-fat, low-sodium cheese. Fats and oils  Soft margarine without trans fats. Vegetable oil. Low-fat, reduced-fat, or light mayonnaise and salad dressings (reduced-sodium). Canola, safflower, olive, soybean, and sunflower oils. Avocado. Seasoning and other foods  Herbs. Spices. Seasoning mixes without salt. Unsalted popcorn and pretzels. Fat-free sweets. What foods are not recommended? The items listed may not be a complete list. Talk with your dietitian about what dietary choices are best for you. Grains  Baked goods made with fat, such as croissants, muffins, or some breads. Dry pasta or rice meal packs. Vegetables  Creamed or fried vegetables. Vegetables in a cheese sauce. Regular canned vegetables (not low-sodium or reduced-sodium). Regular canned tomato sauce and paste (not low-sodium or reduced-sodium). Regular tomato and vegetable juice (not low-sodium or reduced-sodium). Angie Fava. Olives. Fruits  Canned fruit in a light or heavy syrup. Fried fruit. Fruit in cream or butter sauce. Meat and other protein foods  Fatty cuts of meat. Ribs. Fried meat. Berniece Salines. Sausage. Bologna and other processed lunch meats. Salami. Fatback. Hotdogs. Bratwurst. Salted nuts and seeds. Canned beans with added salt. Canned or smoked fish. Whole eggs or egg yolks. Chicken or  [place] with skin. Dairy  Whole or 2% milk, cream, and half-and-half. Whole or full-fat cream cheese. Whole-fat or sweetened yogurt. Full-fat cheese. Nondairy creamers. Whipped toppings. Processed cheese and cheese spreads. Fats and oils  Butter. Stick margarine. Lard. Shortening. Ghee. Bacon fat. Tropical oils, such as coconut, palm kernel, or palm oil. Seasoning and other foods  Salted popcorn and pretzels. Onion salt, garlic salt, seasoned salt, table salt, and sea salt. Worcestershire sauce. Tartar sauce. Barbecue sauce. Teriyaki sauce. Soy sauce, including reduced-sodium. Steak sauce. Canned and packaged gravies. Fish sauce. Oyster sauce. Cocktail sauce. Horseradish that you find on the shelf. Ketchup. Mustard. Meat flavorings and tenderizers. Bouillon cubes. Hot sauce and Tabasco sauce. Premade or packaged marinades. Premade or packaged taco seasonings. Relishes. Regular salad dressings. Where to find more information:  National Heart, Lung, and [place]: https://wilson-eaton.com/  American Heart Association: www.heart.org Summary  The DASH eating plan is a healthy eating plan that has been shown to reduce high blood pressure (hypertension). It may also reduce your risk for type 2 diabetes, heart disease, and stroke.  With the DASH eating plan, you should limit salt (sodium) intake to 2,300 mg a day. If you have hypertension, you may need to reduce your sodium intake to 1,500 mg a day.  When on the DASH eating plan, aim to eat more fresh fruits and vegetables, whole grains, lean proteins, low-fat dairy, and heart-healthy fats.  Work with your health care provider or diet and nutrition specialist (dietitian) to adjust your eating plan to your individual calorie needs. This information is not intended to replace advice given to you by your health care provider. Make sure you discuss any questions you have with your health care provider. Document Released: 04/23/2011 Document Revised:  04/27/2016 Document Reviewed: 04/27/2016 Elsevier Interactive Patient Education  2017 [place]., DO

## 2016-09-25 ENCOUNTER — Other Ambulatory Visit: Payer: Self-pay | Admitting: *Deleted

## 2016-09-25 ENCOUNTER — Telehealth: Payer: Self-pay

## 2016-09-25 ENCOUNTER — Other Ambulatory Visit: Payer: Self-pay

## 2016-09-25 DIAGNOSIS — E1140 Type 2 diabetes mellitus with diabetic neuropathy, unspecified: Secondary | ICD-10-CM

## 2016-09-25 DIAGNOSIS — Z794 Long term (current) use of insulin: Principal | ICD-10-CM

## 2016-09-25 MED ORDER — ACCU-CHEK FASTCLIX LANCETS MISC: 3 refills | Status: DC

## 2016-09-25 MED ORDER — ACCU-CHEK GUIDE W/DEVICE KIT: 1.0000 | PACK | Freq: Once | 0 refills | Status: AC

## 2016-09-25 MED ORDER — GLUCOSE BLOOD VI STRP: ORAL_STRIP | 12 refills | Status: DC

## 2016-09-25 NOTE — Telephone Encounter (Signed)
Call to fup on diabetic shoes Per Dr. Maudie Mercury; will offer her a referral to [facility] to be evaluated and she agreed to this referral  Asked about her one touch and stated she needed control fluid, but this was with in the box.  Per Mechele Claude, the spouse was told this am to pick up a new glucometer at the office as her insurer would not cover the one that had been ordered. Her spouse did not mention this.  Will fup with them in 1 to 2 weeks; Planned trip to [place] next week.  Referral to [facility]

## 2016-09-25 NOTE — Telephone Encounter (Signed)
[facility] faxed a note stating the pts insurance prefers an Accu-chek meter.  New Rx sent for the Accu-chek guide strips, lancets and I called the pts husband and informed him a new meter was left at the front desk for her to pick up.

## 2016-09-25 NOTE — Progress Notes (Unsigned)
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 Maudie Mercury Manuela Schwartz Karrigan Messamore/ RN

## 2016-09-29 NOTE — Progress Notes (Signed)
Aubry Tucholski R., DO  

## 2016-10-12 NOTE — Assessment & Plan Note (Signed)
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 She has been on antiestrogen therapy with Arimidex 1 mg daily since January 2012 I recommended extended adjuvant therapy for 10 years  Arimidex toxicities: 1. Bone density 04/08/2015: T score -1.4 mild osteopenia. 2. Denies any hot flashes or myalgias.  Breast cancer surveillance: 1. Breast exam 10/13/2016 is normal 2. Mammogram 06/10/16 was normal  Patient spends summers in [place] and the rest of the year she spends in [place]. Return to clinic in 1 year for follow-up

## 2016-10-13 ENCOUNTER — Encounter: Payer: Self-pay | Admitting: Hematology and Oncology

## 2016-10-13 ENCOUNTER — Ambulatory Visit (HOSPITAL_BASED_OUTPATIENT_CLINIC_OR_DEPARTMENT_OTHER): Payer: Medicare HMO | Admitting: Hematology and Oncology

## 2016-10-13 DIAGNOSIS — M8580 Other specified disorders of bone density and structure, unspecified site: Secondary | ICD-10-CM

## 2016-10-13 DIAGNOSIS — Z170 Estrogen receptor positive status [ER+]: Secondary | ICD-10-CM

## 2016-10-13 DIAGNOSIS — C50411 Malignant neoplasm of upper-outer quadrant of right female breast: Secondary | ICD-10-CM | POA: Diagnosis not present

## 2016-10-13 DIAGNOSIS — Z79811 Long term (current) use of aromatase inhibitors: Secondary | ICD-10-CM | POA: Diagnosis not present

## 2016-10-13 NOTE — Progress Notes (Signed)
 Patient Care Team: Kim, Hannah R, DO as PCP - General (Family Medicine) Ramos, Richard, MD as Consulting Physician (Physical Medicine and Rehabilitation)  DIAGNOSIS:  Encounter Diagnosis  Name Primary?  . Malignant neoplasm of upper-outer quadrant of right breast in female, estrogen receptor positive (HCC)     SUMMARY OF ONCOLOGIC HISTORY:   Breast cancer of upper-outer quadrant of right female breast (HCC)   03/12/2010 Initial Diagnosis    Invasive lobular cancer, ER 100%, PR 88% Ki-67 13%, HER-2 negative ratio 1.3      04/23/2010 Surgery    Right breast lumpectomy invasive ductal carcinoma grade 1, 1 cm size; one sentinel lymph node negative for malignancy      04/28/2010 - 05/05/2010 Radiation Therapy    Accelerated partial breast radiation with MammoSite      05/27/2010 -  Anti-estrogen oral therapy    Arimidex 1 mg daily       CHIEF COMPLIANT: Follow-up on Arimidex therapy  INTERVAL HISTORY: [name] is a 75-year-old with above-mentioned history of invasive lobular cancer treated with lumpectomy and MammoSite radiation. She is currently on Arimidex therapy since January 2012. She reports to be tolerating it extremely well without any major hot flashes or pain or discomfort in her joints and muscles. She denies any lumps or nodules in breast. Patient tells me that she may have broken a bone in the leg however she is not sure of it. She is getting physical therapy. Today her blood pressure extremely high and she attributes that to not taking her blood pressure pills. She has never run this high previously so we will have to recheck it. She has profound hearing loss and it appears that the hearing aids have malfunctioned.  REVIEW OF SYSTEMS:   Constitutional: Denies fevers, chills or abnormal weight loss Eyes: Denies blurriness of vision Ears, nose, mouth, throat, and face: Denies mucositis or sore throat Respiratory: Denies cough, dyspnea or wheezes Cardiovascular:  Denies palpitation, chest discomfort Gastrointestinal:  Denies nausea, heartburn or change in bowel habits Skin: Denies abnormal skin rashes Lymphatics: Denies new lymphadenopathy or easy bruising Neurological:Denies numbness, tingling or new weaknesses Behavioral/Psych: Mood is stable, no new changes  Extremities: No lower extremity edema Breast:  denies any pain or lumps or nodules in either breasts All other systems were reviewed with the patient and are negative.  I have reviewed the past medical history, past surgical history, social history and family history with the patient and they are unchanged from previous note.  ALLERGIES:  is allergic to sulfonamide derivatives and other.  MEDICATIONS:  Current Outpatient Prescriptions  Medication Sig Dispense Refill  . ACCU-CHEK FASTCLIX LANCETS MISC Use as directed to check blood sugar 100 each 3  . albuterol (PROVENTIL) (2.5 MG/3ML) 0.083% nebulizer solution Take 3 mLs (2.5 mg total) by nebulization every 6 (six) hours as needed for wheezing or shortness of breath. 75 mL 12  . amLODipine (NORVASC) 5 MG tablet Take 1 tablet (5 mg total) by mouth daily. 30 tablet 0  . anastrozole (ARIMIDEX) 1 MG tablet TAKE ONE TABLET BY MOUTH ONCE DAILY 30 tablet 11  . carvedilol (COREG) 3.125 MG tablet Take 1 tablet (3.125 mg total) by mouth 2 (two) times daily with a meal. 180 tablet 1  . cephALEXin (KEFLEX) 500 MG capsule Take 1 capsule (500 mg total) by mouth 3 (three) times daily. 9 capsule 0  . cholecalciferol (VITAMIN D) 1000 units tablet Take 1,000 Units by mouth 2 (two) times daily.    .   DULoxetine (CYMBALTA) 20 MG capsule Take 1 capsule (20 mg total) by mouth daily. (Patient taking differently: Take 20 mg by mouth daily as needed. Mood) 30 capsule 3  . furosemide (LASIX) 40 MG tablet Take 1 tablet (40 mg total) by mouth daily. 90 tablet 1  . glucose blood (ACCU-CHEK GUIDE) test strip Use as instructed to check blood sugar three times a day 100 each  12  . glucose blood (ONETOUCH VERIO) test strip Use as instructed to check blood sugar three times a day 100 each 12  . Insulin Glargine (LANTUS SOLOSTAR) 100 UNIT/ML Solostar Pen Inject 16 Units into the skin daily at 10 pm. 5 pen 3  . Insulin Pen Needle (BD PEN NEEDLE NANO U/F) 32G X 4 MM MISC Use as directed once a day 100 each 1  . Lancets (ONETOUCH ULTRASOFT) lancets Use as instructed to check blood sugar 100 each 11  . lisinopril (PRINIVIL,ZESTRIL) 20 MG tablet Take 1 tablet (20 mg total) by mouth daily. 90 tablet 1  . metFORMIN (GLUCOPHAGE) 1000 MG tablet Take 1 tablet (1,000 mg total) by mouth 2 (two) times daily. 180 tablet 2  . methocarbamol (ROBAXIN) 500 MG tablet Take 500 mg by mouth every 6 (six) hours as needed for muscle spasms. Reported on 09/25/2015    . oxyCODONE (OXY IR/ROXICODONE) 5 MG immediate release tablet Take 1 tablet (5 mg total) by mouth every 6 (six) hours as needed for moderate pain. 30 tablet 0  . polyethylene glycol (MIRALAX / GLYCOLAX) packet Take 17 g by mouth daily as needed for mild constipation. 14 each 0  . potassium chloride (K-DUR) 10 MEQ tablet Take 1 tablet (10 mEq total) by mouth daily. 30 tablet 11  . pramipexole (MIRAPEX) 1 MG tablet TAKE ONE TABLET BY MOUTH TWICE DAILY 180 tablet 1  . Pyridoxine HCl (VITAMIN B-6 PO) Take by mouth.    . vitamin B-12 100 MCG tablet Take 1 tablet (100 mcg total) by mouth daily. 30 tablet 0   No current facility-administered medications for this visit.     PHYSICAL EXAMINATION: ECOG PERFORMANCE STATUS: 1 - Symptomatic but completely ambulatory  Vitals:   10/13/16 0957  BP: (!) 200/77  Pulse: 65  Resp: 19  Temp: 97.9 F (36.6 C)   Filed Weights   10/13/16 0957  Weight: 193 lb 6.4 oz (87.7 kg)    GENERAL:alert, no distress and comfortable SKIN: skin color, texture, turgor are normal, no rashes or significant lesions EYES: normal, Conjunctiva are pink and non-injected, sclera clear OROPHARYNX:no exudate, no  erythema and lips, buccal mucosa, and tongue normal  NECK: supple, thyroid normal size, non-tender, without nodularity LYMPH:  no palpable lymphadenopathy in the cervical, axillary or inguinal LUNGS: clear to auscultation and percussion with normal breathing effort HEART: regular rate & rhythm and no murmurs and no lower extremity edema ABDOMEN:abdomen soft, non-tender and normal bowel sounds MUSCULOSKELETAL:no cyanosis of digits and no clubbing  NEURO: alert & oriented x 3 with fluent speech, no focal motor/sensory deficits EXTREMITIES: No lower extremity edema BREAST: No palpable masses or nodules in either right or left breasts. No palpable axillary supraclavicular or infraclavicular adenopathy no breast tenderness or nipple discharge. (exam performed in the presence of a chaperone)  LABORATORY DATA:  I have reviewed the data as listed   Chemistry      Component Value Date/Time   NA 140 09/22/2016 0951   NA 139 01/25/2014 0959   K 3.7 09/22/2016 0951   K 3.8 01/25/2014  0959   CL 101 09/22/2016 0951   CL 99 10/06/2012 0855   CO2 33 (H) 09/22/2016 0951   CO2 27 01/25/2014 0959   BUN 14 09/22/2016 0951   BUN 13.6 01/25/2014 0959   CREATININE 0.57 09/22/2016 0951   CREATININE 0.58 (L) 11/05/2015 1603   CREATININE 0.8 01/25/2014 0959      Component Value Date/Time   CALCIUM 9.2 09/22/2016 0951   CALCIUM 9.3 01/25/2014 0959   ALKPHOS 84 07/03/2015 0010   ALKPHOS 97 01/25/2014 0959   AST 18 07/03/2015 0010   AST 18 01/25/2014 0959   ALT 16 07/03/2015 0010   ALT 18 01/25/2014 0959   BILITOT 0.8 07/03/2015 0010   BILITOT 0.30 01/25/2014 0959       Lab Results  Component Value Date   WBC 9.4 09/22/2016   HGB 12.9 09/22/2016   HCT 38.8 09/22/2016   MCV 91.9 09/22/2016   PLT 296.0 09/22/2016   NEUTROABS 9.9 (H) 07/31/2016    ASSESSMENT & PLAN:  Breast cancer of upper-outer quadrant of right female breast ([place]) Right breast invasive lobular cancer ER/PR positive HER-2  negative status post lumpectomy in 2011 followed by accelerated partial breast radiation with MammoSite. She has been on antiestrogen therapy with Arimidex 1 mg daily since January 2012 Currently on extended adjuvant therapy for 7-10 years (severe hearing loss)  Arimidex toxicities: 1. Bone density 04/08/2015: T score -1.4 mild osteopenia. 2. Denies any hot flashes or myalgias. I discussed with the patient the recent study ABCSG 16 with suggested that 7 years of therapy may be equal and to 10 years. There was no additional benefit on this study be on 7 years. For now we will continue this treatment until 2019 and make a final decision at that point.  Breast cancer surveillance: 1. Breast exam 10/13/2016 is normal 2. Mammogram 06/10/16 was normal  Patient spends summers in [place] and the rest of the year she spends in [place]. Return to clinic in 1 year for follow-up  I spent 25 minutes talking to the patient of which more than half was spent in counseling and coordination of care.  No orders of the defined types were placed in this encounter.  The patient has a good understanding of the overall plan. she agrees with it. she will call with any problems that may develop before the next visit here.   Rulon Eisenmenger, MD 10/13/16

## 2016-10-16 ENCOUNTER — Ambulatory Visit: Payer: Medicare HMO | Admitting: Adult Health

## 2016-10-21 DIAGNOSIS — M5136 Other intervertebral disc degeneration, lumbar region: Secondary | ICD-10-CM | POA: Diagnosis not present

## 2016-10-26 MED FILL — LANTUS SOLOSTAR 100 UNITS/M: 100 | 90 days supply | Qty: 15 | Fill #1

## 2016-10-27 DIAGNOSIS — H5210 Myopia, unspecified eye: Secondary | ICD-10-CM | POA: Diagnosis not present

## 2016-10-27 DIAGNOSIS — H25813 Combined forms of age-related cataract, bilateral: Secondary | ICD-10-CM | POA: Diagnosis not present

## 2016-10-27 LAB — HM DIABETES EYE EXAM

## 2016-11-03 ENCOUNTER — Other Ambulatory Visit: Payer: Self-pay | Admitting: Family Medicine

## 2016-11-03 ENCOUNTER — Other Ambulatory Visit: Payer: Self-pay | Admitting: Cardiology

## 2016-11-03 NOTE — Telephone Encounter (Signed)
Patient is current on her OV/Labs. Last refill was 02/18/2016 for 180 with 1 refill, please Advise if okey to refill.

## 2016-11-05 ENCOUNTER — Other Ambulatory Visit: Payer: Self-pay

## 2016-11-10 ENCOUNTER — Ambulatory Visit: Payer: Medicare HMO | Admitting: Pulmonary Disease

## 2016-11-16 ENCOUNTER — Ambulatory Visit (INDEPENDENT_AMBULATORY_CARE_PROVIDER_SITE_OTHER): Payer: Medicare HMO | Admitting: Family Medicine

## 2016-11-16 ENCOUNTER — Encounter: Payer: Self-pay | Admitting: Family Medicine

## 2016-11-16 VITALS — BP 112/80 | HR 72 | Temp 98.4°F | Ht 61.0 in | Wt 194.3 lb

## 2016-11-16 DIAGNOSIS — B029 Zoster without complications: Secondary | ICD-10-CM

## 2016-11-16 MED ORDER — VALACYCLOVIR HCL 1 G PO TABS: 1000.0000 mg | ORAL_TABLET | Freq: Three times a day (TID) | ORAL | 0 refills | Status: DC

## 2016-11-16 NOTE — Progress Notes (Signed)
HPI:  Acute visit for R flank pain: -burning pain R flank for last few days and yesterday developed ras on R upper abd -no fevers, malaise, vomiting, diarrhea or other symptoms  ROS: See pertinent positives and negatives per HPI.  Past Medical History:  Diagnosis Date  . AK (actinic keratosis)   . Anxiety    occasional  . Breast cancer ([place])    R breast, s/p radiation 34Gy/22fx 04/29/10-05/05/10  . CTS (carpal tunnel syndrome)    Left  . Diabetes ([place])   . Diastolic CHF ([place])    Dx w/ hospitalization 06/2015 for respiratory failure  . Facial asymmetry, acquired 08/20/2016   From injury  . Falls frequently    "can not pick left leg up"  . Foot fracture   . Hearing loss    bilateral hearing aides  . History of environmental allergies   . History of kidney stones 20 years ago  . Hypertension   . MDD (major depressive disorder)   . No blood products ([employer])  09/11/2013  . Osteoarthritis    hx shoulder, knee, back, wrist pain; saw Dr. Wynelle Link   . RESTLESS LEGS SYNDROME 05/05/2007   Qualifier: Diagnosis of  By: Gwenette Greet MD, Armando Reichert   . RLS (restless legs syndrome)   . Sleep apnea     Past Surgical History:  Procedure Laterality Date  . APPENDECTOMY  age 70  . BREAST SURGERY  2012   rt breast lumpectomy  . flex signoidoscopy    . INCISION AND DRAINAGE PERIRECTAL ABSCESS    . JOINT REPLACEMENT Right 2006   knee  . TOTAL KNEE ARTHROPLASTY Left 08/06/2014   Procedure: LEFT TOTAL KNEE ARTHROPLASTY;  Surgeon: Gaynelle Arabian, MD;  Location: [facility];  Service: Orthopedics;  Laterality: Left;  . TUBAL LIGATION  1979    Family History  Problem Relation Age of Onset  . Alcohol abuse Father   . Cancer Sister        breast  . Heart disease Paternal Grandfather   . Diabetes Other   . Obesity Other   . Heart disease Brother        Valve    Social History   Social History  . Marital status: Married    Spouse name: N/A  . Number of children: 3  . Years of  education: N/A   Social History Main Topics  . Smoking status: Never Smoker  . Smokeless tobacco: Never Used  . Alcohol use No  . Drug use: No  . Sexual activity: Yes   Other Topics Concern  . None   Social History Narrative         Home Situation: lives with husband and granddaughter      Spiritual Beliefs: [employer] Witness - no blood products                    Current Outpatient Prescriptions:  .  ACCU-CHEK FASTCLIX LANCETS MISC, Use as directed to check blood sugar, Disp: 100 each, Rfl: 3 .  amLODipine (NORVASC) 5 MG tablet, Take 1 tablet (5 mg total) by mouth daily., Disp: 30 tablet, Rfl: 0 .  anastrozole (ARIMIDEX) 1 MG tablet, TAKE ONE TABLET BY MOUTH ONCE DAILY, Disp: 30 tablet, Rfl: 11 .  carvedilol (COREG) 3.125 MG tablet, Take 1 tablet (3.125 mg total) by mouth 2 (two) times daily with a meal., Disp: 180 tablet, Rfl: 1 .  cholecalciferol (VITAMIN D) 1000 units tablet, Take 1,000 Units by mouth 2 (two)  times daily., Disp: , Rfl:  .  DULoxetine (CYMBALTA) 20 MG capsule, Take 1 capsule (20 mg total) by mouth daily. (Patient taking differently: Take 20 mg by mouth daily as needed. Mood), Disp: 30 capsule, Rfl: 3 .  furosemide (LASIX) 40 MG tablet, Take 1 tablet (40 mg total) by mouth daily., Disp: 90 tablet, Rfl: 1 .  glucose blood (ACCU-CHEK GUIDE) test strip, Use as instructed to check blood sugar three times a day, Disp: 100 each, Rfl: 12 .  glucose blood (ONETOUCH VERIO) test strip, Use as instructed to check blood sugar three times a day, Disp: 100 each, Rfl: 12 .  Insulin Glargine (LANTUS SOLOSTAR) 100 UNIT/ML Solostar Pen, Inject 16 Units into the skin daily at 10 pm., Disp: 5 pen, Rfl: 3 .  Insulin Pen Needle (BD PEN NEEDLE NANO U/F) 32G X 4 MM MISC, Use as directed once a day, Disp: 100 each, Rfl: 1 .  Lancets (ONETOUCH ULTRASOFT) lancets, Use as instructed to check blood sugar, Disp: 100 each, Rfl: 11 .  lisinopril (PRINIVIL,ZESTRIL) 20 MG tablet, Take 1 tablet  (20 mg total) by mouth daily., Disp: 90 tablet, Rfl: 1 .  metFORMIN (GLUCOPHAGE) 1000 MG tablet, Take 1 tablet (1,000 mg total) by mouth 2 (two) times daily., Disp: 180 tablet, Rfl: 2 .  methocarbamol (ROBAXIN) 500 MG tablet, Take 500 mg by mouth every 6 (six) hours as needed for muscle spasms. Reported on 09/25/2015, Disp: , Rfl:  .  potassium chloride (K-DUR) 10 MEQ tablet, Take 1 tablet (10 mEq total) by mouth daily., Disp: 30 tablet, Rfl: 11 .  pramipexole (MIRAPEX) 1 MG tablet, TAKE ONE TABLET BY MOUTH TWICE DAILY, Disp: 180 tablet, Rfl: 1 .  Pyridoxine HCl (VITAMIN B-6 PO), Take by mouth., Disp: , Rfl:  .  vitamin B-12 100 MCG tablet, Take 1 tablet (100 mcg total) by mouth daily., Disp: 30 tablet, Rfl: 0 .  valACYclovir (VALTREX) 1000 MG tablet, Take 1 tablet (1,000 mg total) by mouth 3 (three) times daily., Disp: 21 tablet, Rfl: 0  EXAM:  Vitals:   11/16/16 1450  BP: 112/80  Pulse: 72  Temp: 98.4 F (36.9 C)    Body mass index is 36.71 kg/m.  GENERAL: vitals reviewed and listed above, alert, oriented, appears well hydrated and in no acute distress  HEENT: atraumatic, conjunttiva clear, no obvious abnormalities on inspection of external nose and ears  NECK: no obvious masses on inspection  LUNGS: clear to auscultation bilaterally, no wheezes, rales or rhonchi, good air movement  CV: HRRR, no peripheral edema  SKIN: erythematous papulovesicular rash in dermatomal pattern R back/flank/abd  MS: moves all extremities without noticeable abnormality  PSYCH: pleasant and cooperative, no obvious depression or anxiety  ASSESSMENT AND PLAN:  Discussed the following assessment and plan:  Herpes zoster without complication  -discussed potential etiologies, treatments, treatment risks, pot complications and return precautions -classic shingles rash -Patient advised to return or notify a doctor immediately if symptoms worsen or persist or new concerns arise.  Patient  Instructions  Go get the shingles/anti-viral medication (Valtrex) and start it right away.   Shingles Shingles, which is also known as herpes zoster, is an infection that causes a painful skin rash and fluid-filled blisters. Shingles is not related to genital herpes, which is a sexually transmitted infection. Shingles only develops in people who:  Have had chickenpox.  Have received the chickenpox vaccine. (This is rare.)  What are the causes? Shingles is caused by varicella-zoster virus (VZV). This  is the same virus that causes chickenpox. After exposure to VZV, the virus stays in the body in an inactive (dormant) state. Shingles develops if the virus reactivates. This can happen many years after the initial exposure to VZV. It is not known what causes this virus to reactivate. What increases the risk? People who have had chickenpox or received the chickenpox vaccine are at risk for shingles. Infection is more common in people who:  Are older than age 28.  Have a weakened defense (immune) system, such as those with HIV, AIDS, or cancer.  Are taking medicines that weaken the immune system, such as transplant medicines.  Are under great stress.  What are the signs or symptoms? Early symptoms of this condition include itching, tingling, and pain in an area on your skin. Pain may be described as burning, stabbing, or throbbing. A few days or weeks after symptoms start, a painful red rash appears, usually on one side of the body in a bandlike or beltlike pattern. The rash eventually turns into fluid-filled blisters that break open, scab over, and dry up in about 2-3 weeks. At any time during the infection, you may also develop:  A fever.  Chills.  A headache.  An upset stomach.  How is this diagnosed? This condition is diagnosed with a skin exam. Sometimes, skin or fluid samples are taken from the blisters before a diagnosis is made. These samples are examined under a microscope or  sent to a lab for testing. How is this treated? There is no specific cure for this condition. Your health care provider will probably prescribe medicines to help you manage pain, recover more quickly, and avoid long-term problems. Medicines may include:  Antiviral drugs.  Anti-inflammatory drugs.  Pain medicines.  If the area involved is on your face, you may be referred to a specialist, such as an eye doctor (ophthalmologist) or an ear, nose, and throat (ENT) doctor to help you avoid eye problems, chronic pain, or disability. Follow these instructions at home: Medicines  Take medicines only as directed by your health care provider.  Apply an anti-itch or numbing cream to the affected area as directed by your health care provider. Blister and Rash Care  Take a cool bath or apply cool compresses to the area of the rash or blisters as directed by your health care provider. This may help with pain and itching.  Keep your rash covered with a loose bandage (dressing). Wear loose-fitting clothing to help ease the pain of material rubbing against the rash.  Keep your rash and blisters clean with mild soap and cool water or as directed by your health care provider.  Check your rash every day for signs of infection. These include redness, swelling, and pain that lasts or increases.  Do not pick your blisters.  Do not scratch your rash. General instructions  Rest as directed by your health care provider.  Keep all follow-up visits as directed by your health care provider. This is important.  Until your blisters scab over, your infection can cause chickenpox in people who have never had it or been vaccinated against it. To prevent this from happening, avoid contact with other people, especially: ? Babies. ? Pregnant women. ? Children who have eczema. ? Elderly people who have transplants. ? People who have chronic illnesses, such as leukemia or AIDS. Contact a health care provider  if:  Your pain is not relieved with prescribed medicines.  Your pain does not get better after the rash  heals.  Your rash looks infected. Signs of infection include redness, swelling, and pain that lasts or increases. Get help right away if:  The rash is on your face or nose.  You have facial pain, pain around your eye area, or loss of feeling on one side of your face.  You have ear pain or you have ringing in your ear.  You have loss of taste.  Your condition gets worse. This information is not intended to replace advice given to you by your health care provider. Make sure you discuss any questions you have with your health care provider. Document Released: 05/04/2005 Document Revised: 12/29/2015 Document Reviewed: 03/15/2014 Elsevier Interactive Patient Education  2017 [place]., DO

## 2016-11-16 NOTE — Patient Instructions (Signed)
Go get the shingles/anti-viral medication (Valtrex) and start it right away.   Shingles Shingles, which is also known as herpes zoster, is an infection that causes a painful skin rash and fluid-filled blisters. Shingles is not related to genital herpes, which is a sexually transmitted infection. Shingles only develops in people who:  Have had chickenpox.  Have received the chickenpox vaccine. (This is rare.)  What are the causes? Shingles is caused by varicella-zoster virus (VZV). This is the same virus that causes chickenpox. After exposure to VZV, the virus stays in the body in an inactive (dormant) state. Shingles develops if the virus reactivates. This can happen many years after the initial exposure to VZV. It is not known what causes this virus to reactivate. What increases the risk? People who have had chickenpox or received the chickenpox vaccine are at risk for shingles. Infection is more common in people who:  Are older than age 18.  Have a weakened defense (immune) system, such as those with HIV, AIDS, or cancer.  Are taking medicines that weaken the immune system, such as transplant medicines.  Are under great stress.  What are the signs or symptoms? Early symptoms of this condition include itching, tingling, and pain in an area on your skin. Pain may be described as burning, stabbing, or throbbing. A few days or weeks after symptoms start, a painful red rash appears, usually on one side of the body in a bandlike or beltlike pattern. The rash eventually turns into fluid-filled blisters that break open, scab over, and dry up in about 2-3 weeks. At any time during the infection, you may also develop:  A fever.  Chills.  A headache.  An upset stomach.  How is this diagnosed? This condition is diagnosed with a skin exam. Sometimes, skin or fluid samples are taken from the blisters before a diagnosis is made. These samples are examined under a microscope or sent to a lab  for testing. How is this treated? There is no specific cure for this condition. Your health care provider will probably prescribe medicines to help you manage pain, recover more quickly, and avoid long-term problems. Medicines may include:  Antiviral drugs.  Anti-inflammatory drugs.  Pain medicines.  If the area involved is on your face, you may be referred to a specialist, such as an eye doctor (ophthalmologist) or an ear, nose, and throat (ENT) doctor to help you avoid eye problems, chronic pain, or disability. Follow these instructions at home: Medicines  Take medicines only as directed by your health care provider.  Apply an anti-itch or numbing cream to the affected area as directed by your health care provider. Blister and Rash Care  Take a cool bath or apply cool compresses to the area of the rash or blisters as directed by your health care provider. This may help with pain and itching.  Keep your rash covered with a loose bandage (dressing). Wear loose-fitting clothing to help ease the pain of material rubbing against the rash.  Keep your rash and blisters clean with mild soap and cool water or as directed by your health care provider.  Check your rash every day for signs of infection. These include redness, swelling, and pain that lasts or increases.  Do not pick your blisters.  Do not scratch your rash. General instructions  Rest as directed by your health care provider.  Keep all follow-up visits as directed by your health care provider. This is important.  Until your blisters scab over, your infection  can cause chickenpox in people who have never had it or been vaccinated against it. To prevent this from happening, avoid contact with other people, especially: ? Babies. ? Pregnant women. ? Children who have eczema. ? Elderly people who have transplants. ? People who have chronic illnesses, such as leukemia or AIDS. Contact a health care provider if:  Your pain  is not relieved with prescribed medicines.  Your pain does not get better after the rash heals.  Your rash looks infected. Signs of infection include redness, swelling, and pain that lasts or increases. Get help right away if:  The rash is on your face or nose.  You have facial pain, pain around your eye area, or loss of feeling on one side of your face.  You have ear pain or you have ringing in your ear.  You have loss of taste.  Your condition gets worse. This information is not intended to replace advice given to you by your health care provider. Make sure you discuss any questions you have with your health care provider. Document Released: 05/04/2005 Document Revised: 12/29/2015 Document Reviewed: 03/15/2014 Elsevier Interactive Patient Education  2017 [employer].

## 2016-11-27 ENCOUNTER — Encounter: Payer: Self-pay | Admitting: Adult Health

## 2016-11-27 ENCOUNTER — Ambulatory Visit (INDEPENDENT_AMBULATORY_CARE_PROVIDER_SITE_OTHER): Payer: Medicare HMO | Admitting: Adult Health

## 2016-11-27 VITALS — BP 142/90 | HR 81 | Temp 98.4°F | Ht 61.0 in | Wt 194.2 lb

## 2016-11-27 DIAGNOSIS — B027 Disseminated zoster: Secondary | ICD-10-CM | POA: Diagnosis not present

## 2016-11-27 MED ORDER — TRAMADOL HCL 50 MG PO TABS: ORAL_TABLET | ORAL | 0 refills | Status: DC

## 2016-11-27 NOTE — Progress Notes (Signed)
Subjective:    Patient ID: [name], female    DOB: Mar 22, 1941, 76 y.o.   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   She is a patient of Dr. Maudie Mercury who I am seeing today for the first time. She was diagnosed with Shingles on 11/16/2016 and was prescribed a course of Valtrex. She has completed this course but continues to have " severe" pain, that she cannot describe along the dermatome that the shingles rash is present    Review of Systems See HPI   Past Medical History:  Diagnosis Date  . AK (actinic keratosis)   . Anxiety    occasional  . Breast cancer ([place])    R breast, s/p radiation 34Gy/31fx 04/29/10-05/05/10  . CTS (carpal tunnel syndrome)    Left  . Diabetes ([place])   . 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 Facial asymmetry, acquired 08/20/2016   From injury  . Falls frequently    "can not pick left leg up"  . Foot fracture   . Hearing loss    bilateral hearing aides  . History of environmental allergies   . History of kidney stones 20 years ago  . Hypertension   . MDD (major depressive disorder)   . No blood products ([place])  09/11/2013  . Osteoarthritis    hx shoulder, knee, back, wrist pain; saw Dr. Wynelle Link   . RESTLESS LEGS SYNDROME 05/05/2007   Qualifier: Diagnosis of  By: Gwenette Greet MD, Armando Reichert   . RLS (restless legs syndrome)   . Sleep apnea     Social History   Social History  . Marital status: Married     Spouse name: N/A  . Number of children: 3  . Years of education: N/A   Occupational History  . Not on file.   Social History Main Topics  . Smoking status: Never Smoker  . Smokeless tobacco: Never Used  . Alcohol use No  . Drug use: No  . Sexual activity: Yes   Other Topics Concern  . Not on file   Social History Narrative         Home Situation: lives with husband and granddaughter      Spiritual Beliefs: Jehovah Witness - no blood products                   Past Surgical History:  Procedure Laterality Date  . APPENDECTOMY  age 40  . BREAST SURGERY  2012   rt breast lumpectomy  . flex signoidoscopy    . INCISION AND DRAINAGE PERIRECTAL ABSCESS    . JOINT REPLACEMENT Right 2006   knee  . TOTAL KNEE ARTHROPLASTY Left 08/06/2014   Procedure: LEFT TOTAL KNEE ARTHROPLASTY;  Surgeon: Gaynelle Arabian, MD;  Location: [facility];  Service: Orthopedics;  Laterality: Left;  . TUBAL LIGATION  1979    Family History  Problem Relation Age of Onset  . Alcohol abuse Father   . Cancer Sister        breast  . Heart disease  Paternal Grandfather   . Diabetes Other   . Obesity Other   . Heart disease Brother        Valve    Allergies  Allergen Reactions  . Sulfonamide Derivatives Swelling    Lips Swelling  . Other     BLOOD PRODUCT REFUSAL    Current Outpatient Prescriptions on File Prior to Visit  Medication Sig Dispense Refill  . ACCU-CHEK FASTCLIX LANCETS MISC Use as directed to check blood sugar 100 each 3  . amLODipine (NORVASC) 5 MG tablet Take 1 tablet (5 mg total) by mouth daily. 30 tablet 0  . anastrozole (ARIMIDEX) 1 MG tablet TAKE ONE TABLET BY MOUTH ONCE DAILY 30 tablet 11  . carvedilol (COREG) 3.125 MG tablet Take 1 tablet (3.125 mg total) by mouth 2 (two) times daily with a meal. 180 tablet 1  . cholecalciferol (VITAMIN D) 1000 units tablet Take 1,000 Units by mouth 2 (two) times daily.    . DULoxetine (CYMBALTA) 20 MG capsule Take 1 capsule (20 mg total)  by mouth daily. (Patient taking differently: Take 20 mg by mouth daily as needed. Mood) 30 capsule 3  . furosemide (LASIX) 40 MG tablet Take 1 tablet (40 mg total) by mouth daily. 90 tablet 1  . glucose blood (ACCU-CHEK GUIDE) test strip Use as instructed to check blood sugar three times a day 100 each 12  . glucose blood (ONETOUCH VERIO) test strip Use as instructed to check blood sugar three times a day 100 each 12  . Insulin Glargine (LANTUS SOLOSTAR) 100 UNIT/ML Solostar Pen Inject 16 Units into the skin daily at 10 pm. 5 pen 3  . Insulin Pen Needle (BD PEN NEEDLE NANO U/F) 32G X 4 MM MISC Use as directed once a day 100 each 1  . Lancets (ONETOUCH ULTRASOFT) lancets Use as instructed to check blood sugar 100 each 11  . lisinopril (PRINIVIL,ZESTRIL) 20 MG tablet Take 1 tablet (20 mg total) by mouth daily. 90 tablet 1  . metFORMIN (GLUCOPHAGE) 1000 MG tablet Take 1 tablet (1,000 mg total) by mouth 2 (two) times daily. 180 tablet 2  . methocarbamol (ROBAXIN) 500 MG tablet Take 500 mg by mouth every 6 (six) hours as needed for muscle spasms. Reported on 09/25/2015    . potassium chloride (K-DUR) 10 MEQ tablet Take 1 tablet (10 mEq total) by mouth daily. 30 tablet 11  . pramipexole (MIRAPEX) 1 MG tablet TAKE ONE TABLET BY MOUTH TWICE DAILY 180 tablet 1  . Pyridoxine HCl (VITAMIN B-6 PO) Take by mouth.    . valACYclovir (VALTREX) 1000 MG tablet Take 1 tablet (1,000 mg total) by mouth 3 (three) times daily. 21 tablet 0  . vitamin B-12 100 MCG tablet Take 1 tablet (100 mcg total) by mouth daily. 30 tablet 0   No current facility-administered medications on file prior to visit.     BP (!) 142/90   Pulse 81   Temp 98.4 F (36.9 C) (Oral)   Ht 5\' 1"  (1.549 m)   Wt 194 lb 3.2 oz (88.1 kg)   BMI 36.69 kg/m       Objective:   Physical Exam  Constitutional: She is oriented to person, place, and time. She appears well-developed and well-nourished. No distress.  Cardiovascular: Normal rate,  regular rhythm, normal heart sounds and intact distal pulses.  Exam reveals no gallop.   No murmur heard. Pulmonary/Chest: Effort normal and breath sounds normal. No respiratory distress. She has no wheezes. She has no  rales. She exhibits no tenderness.  Abdominal: Bowel sounds are normal. She exhibits no distension and no mass. There is tenderness. There is no rebound.  Neurological: She is alert and oriented to person, place, and time.  Skin: Skin is warm and dry. No rash noted. She is not diaphoretic. No erythema. No pallor.   erythematous rash in dermatomal pattern R back/flank/abd. New new vesicles noted   Psychiatric: She has a normal mood and affect. Her behavior is normal. Judgment and thought content normal.  Nursing note and vitals reviewed.      Assessment & Plan:  1. Disseminated herpes zoster - Can take Motrin 600mg  or Tylenol 500 mg every 6-8 hours. Tramadol for break through pain  - traMADol (ULTRAM) 50 MG tablet; Take 0.5 - 1 tablet every 6-8 hours as needed for pain  Dispense: 15 tablet; Refill: 0 - Follow up if no improvement    Dorothyann Peng, NP

## 2016-12-16 ENCOUNTER — Ambulatory Visit (INDEPENDENT_AMBULATORY_CARE_PROVIDER_SITE_OTHER): Payer: Medicare HMO | Admitting: Podiatry

## 2016-12-16 ENCOUNTER — Encounter: Payer: Self-pay | Admitting: Podiatry

## 2016-12-16 VITALS — BP 197/96 | HR 70

## 2016-12-16 DIAGNOSIS — M2142 Flat foot [pes planus] (acquired), left foot: Secondary | ICD-10-CM

## 2016-12-16 DIAGNOSIS — M205X1 Other deformities of toe(s) (acquired), right foot: Secondary | ICD-10-CM

## 2016-12-16 DIAGNOSIS — M2141 Flat foot [pes planus] (acquired), right foot: Secondary | ICD-10-CM | POA: Diagnosis not present

## 2016-12-16 DIAGNOSIS — E1142 Type 2 diabetes mellitus with diabetic polyneuropathy: Secondary | ICD-10-CM | POA: Diagnosis not present

## 2016-12-16 DIAGNOSIS — M205X2 Other deformities of toe(s) (acquired), left foot: Secondary | ICD-10-CM

## 2016-12-16 NOTE — Progress Notes (Signed)
   Subjective:    Patient ID: [name], female    DOB: 23-Feb-1941, 76 y.o.   MRN: [id number]  HPI this patient presents the office with chief complaint of diabetes with neuropathy.  She presents the office stating that she is interested in inquiring diabetic shoes.  She was referred to this office by her medical doctor.  She presents the office for an evaluation of her diabetic feet and for the acquisition of diabetic shoes    Review of Systems  HENT: Positive for hearing loss.   Eyes: Positive for itching.  Respiratory: Positive for shortness of breath.   Cardiovascular: Positive for leg swelling.       Objective:   Physical Exam GENERAL APPEARANCE: Alert, conversant. Appropriately groomed. No acute distress.  VASCULAR: Pedal pulses are  palpable at  [facility] and PT right  foot.  DP and PT are not palpable left foot.   Capillary refill time is immediate to all digits,  Normal temperature gradient.  Digital hair growth is present bilateral  NEUROLOGIC: sensation is normal to 5.07 monofilament at 5/5 sites bilateral.  Light touch is intact bilateral, Muscle strength normal.  MUSCULOSKELETAL: acceptable muscle strength, tone and stability bilateral.  Hallux limitus 1st MPJ  B/L.  Pes planus.   DERMATOLOGIC: skin color, texture, and turgor are within normal limits.  No preulcerative lesions or ulcers  are seen, no interdigital maceration noted.  No open lesions present.  Digital nails are asymptomatic. No drainage noted.         Assessment & Plan:  Diabetes with neuropathy   Hallux limitus 1st MPJ  R  Hallux limitus 1st MPJ left.  IE.  Absence of any pulses in her left foot.  Patient was evaluated a measured for diabetic shoes.   Patient will be called when her shoes arrive.  Patient was told to return to the office and I can perform preventative foot care services as needed in 3 months   Gardiner Barefoot DPM

## 2017-01-02 ENCOUNTER — Other Ambulatory Visit: Payer: Self-pay | Admitting: Hematology and Oncology

## 2017-01-02 ENCOUNTER — Other Ambulatory Visit: Payer: Self-pay | Admitting: Cardiology

## 2017-01-02 ENCOUNTER — Other Ambulatory Visit: Payer: Self-pay | Admitting: Family Medicine

## 2017-01-04 ENCOUNTER — Telehealth: Payer: Self-pay | Admitting: Cardiology

## 2017-01-04 NOTE — Telephone Encounter (Signed)
Rx(s) sent to pharmacy electronically.  

## 2017-01-04 NOTE — Telephone Encounter (Signed)
Called and LVM for the patient to call back and make an appointment with Dr. Percival Spanish.

## 2017-01-11 MED FILL — LANTUS SOLOSTAR 100 UNITS/M: 100 | 90 days supply | Qty: 15 | Fill #2

## 2017-01-25 ENCOUNTER — Other Ambulatory Visit: Payer: Self-pay | Admitting: Family Medicine

## 2017-01-27 ENCOUNTER — Ambulatory Visit: Payer: Medicare HMO

## 2017-02-02 ENCOUNTER — Ambulatory Visit (INDEPENDENT_AMBULATORY_CARE_PROVIDER_SITE_OTHER): Payer: Medicare HMO | Admitting: Orthotics

## 2017-02-02 DIAGNOSIS — M205X2 Other deformities of toe(s) (acquired), left foot: Secondary | ICD-10-CM | POA: Diagnosis not present

## 2017-02-02 DIAGNOSIS — E1142 Type 2 diabetes mellitus with diabetic polyneuropathy: Secondary | ICD-10-CM

## 2017-02-02 DIAGNOSIS — M205X1 Other deformities of toe(s) (acquired), right foot: Secondary | ICD-10-CM

## 2017-02-04 ENCOUNTER — Encounter: Payer: Self-pay | Admitting: Family Medicine

## 2017-02-09 ENCOUNTER — Ambulatory Visit (INDEPENDENT_AMBULATORY_CARE_PROVIDER_SITE_OTHER): Payer: Medicare HMO | Admitting: Podiatry

## 2017-02-09 ENCOUNTER — Encounter: Payer: Self-pay | Admitting: Podiatry

## 2017-02-09 DIAGNOSIS — R2681 Unsteadiness on feet: Secondary | ICD-10-CM

## 2017-02-09 DIAGNOSIS — M2141 Flat foot [pes planus] (acquired), right foot: Secondary | ICD-10-CM | POA: Diagnosis not present

## 2017-02-09 DIAGNOSIS — M2142 Flat foot [pes planus] (acquired), left foot: Secondary | ICD-10-CM | POA: Diagnosis not present

## 2017-02-09 DIAGNOSIS — E1142 Type 2 diabetes mellitus with diabetic polyneuropathy: Secondary | ICD-10-CM

## 2017-02-09 DIAGNOSIS — F40298 Other specified phobia: Secondary | ICD-10-CM

## 2017-02-09 DIAGNOSIS — Z9181 History of falling: Secondary | ICD-10-CM

## 2017-02-11 NOTE — Progress Notes (Signed)
Patient came in today for evaluation/casting for [place] per recommendation Dr. Prudence Davidson.  Patient has hx of falls (more than 2 this year 2018) resulting in injury (eye and fx tibia)' plus she has a pronounce fear of falling.   In the Timed Up and Go evaluation/test she was unable to get up from seated position and walk 10 feet in under 20 seconds, increasing her risk for fall.  She also demonstrated difficulty in getting up unassisted from a chair in which she was unable to complete 5 rises from chair in 30 seconds.  Finally she also had difficulty in completing satisfactory the 4 stage balance test.  Based upon this and her unsteady gait, [name] is an excellent candidate for balance bracing.

## 2017-02-11 NOTE — Progress Notes (Signed)

## 2017-02-16 ENCOUNTER — Ambulatory Visit: Payer: Medicare HMO | Admitting: Orthotics

## 2017-02-16 DIAGNOSIS — E1142 Type 2 diabetes mellitus with diabetic polyneuropathy: Secondary | ICD-10-CM

## 2017-02-16 DIAGNOSIS — F40298 Other specified phobia: Secondary | ICD-10-CM

## 2017-02-16 DIAGNOSIS — R2681 Unsteadiness on feet: Secondary | ICD-10-CM

## 2017-02-16 DIAGNOSIS — Z9181 History of falling: Secondary | ICD-10-CM

## 2017-02-16 NOTE — Progress Notes (Signed)
Patient presents today per recommendation of Dr. Prudence Davidson for MBB.  Patient has history of falling this past year:  3 times and injury resulted (fx leg).  Patient has fear of falling and wants more stability in her gait.  TUG test, patient got up seated position and walked 10 feet greater than 10 seconds, had difficulty in heel to heel balance test.  Also balance compromised due to polyneuropathy.  She is excellent candidate for MBB

## 2017-02-16 NOTE — Progress Notes (Signed)
This patient presents the office for evaluation of her diabetic feet. She has been diagnosed with diabetes with neuropathy.  She presents the office today for an evaluation of her feet and to acquire diabetic footwear .  General Appearance  Alert, conversant and in no acute stress.  Vascular  Dorsalis pedis and posterior pulses are palpable  bilaterally.  Capillary return is within normal limits  Bilaterally. Temperature is within normal limits  Bilaterally  Neurologic  Senn-Weinstein monofilament wire test diminished   bilaterally. Muscle power  Within normal limits bilaterally.  Nails normal nails with no evidence of bacterial infection or fungal infection.  Orthopedic  No limitations of motion of motion feet bilaterally.  No crepitus or effusions noted. 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  Skin  normotropic skin with no porokeratosis noted bilaterally.  No signs of infections or ulcers noted.     Diabetic neuropathy.  Hallux limitus 1st MPJ  B/L.  Gait instability.   IE.  Patient will be evaluated and treated by [facility] for footwear and for her gait instability.   Gardiner Barefoot DPM

## 2017-03-02 ENCOUNTER — Encounter: Payer: Self-pay | Admitting: Family Medicine

## 2017-03-11 ENCOUNTER — Other Ambulatory Visit: Payer: Self-pay | Admitting: Cardiology

## 2017-03-16 ENCOUNTER — Ambulatory Visit (INDEPENDENT_AMBULATORY_CARE_PROVIDER_SITE_OTHER): Payer: Medicare HMO | Admitting: Orthotics

## 2017-03-16 DIAGNOSIS — M2141 Flat foot [pes planus] (acquired), right foot: Secondary | ICD-10-CM

## 2017-03-16 DIAGNOSIS — M2142 Flat foot [pes planus] (acquired), left foot: Secondary | ICD-10-CM

## 2017-03-16 DIAGNOSIS — R2681 Unsteadiness on feet: Secondary | ICD-10-CM | POA: Diagnosis not present

## 2017-03-16 DIAGNOSIS — F40298 Other specified phobia: Secondary | ICD-10-CM

## 2017-03-16 DIAGNOSIS — Z9181 History of falling: Secondary | ICD-10-CM

## 2017-03-16 DIAGNOSIS — M205X2 Other deformities of toe(s) (acquired), left foot: Secondary | ICD-10-CM

## 2017-03-16 NOTE — Progress Notes (Signed)
Patient came in today to pick up [place] Balance Brace (Bilateral).   Patient was able to don brace independently and brace was check for fit to custom.   Patient was observed walking with brace and gait was improved as well as stability.   Patient was advised of care and wearing instructions.  Advised to notify practice if there were any issues; especially skin irritation.  

## 2017-03-23 ENCOUNTER — Encounter: Payer: Self-pay | Admitting: Family Medicine

## 2017-03-23 ENCOUNTER — Ambulatory Visit: Payer: Medicare HMO | Admitting: Family Medicine

## 2017-03-23 VITALS — BP 160/90 | HR 80 | Temp 98.4°F | Ht 61.0 in

## 2017-03-23 DIAGNOSIS — G8929 Other chronic pain: Secondary | ICD-10-CM | POA: Diagnosis not present

## 2017-03-23 DIAGNOSIS — R4789 Other speech disturbances: Secondary | ICD-10-CM

## 2017-03-23 DIAGNOSIS — R413 Other amnesia: Secondary | ICD-10-CM | POA: Diagnosis not present

## 2017-03-23 DIAGNOSIS — F330 Major depressive disorder, recurrent, mild: Secondary | ICD-10-CM | POA: Diagnosis not present

## 2017-03-23 DIAGNOSIS — Z794 Long term (current) use of insulin: Secondary | ICD-10-CM

## 2017-03-23 DIAGNOSIS — I10 Essential (primary) hypertension: Secondary | ICD-10-CM

## 2017-03-23 DIAGNOSIS — I5032 Chronic diastolic (congestive) heart failure: Secondary | ICD-10-CM

## 2017-03-23 DIAGNOSIS — E1140 Type 2 diabetes mellitus with diabetic neuropathy, unspecified: Secondary | ICD-10-CM

## 2017-03-23 DIAGNOSIS — Z23 Encounter for immunization: Secondary | ICD-10-CM

## 2017-03-23 DIAGNOSIS — E1159 Type 2 diabetes mellitus with other circulatory complications: Secondary | ICD-10-CM

## 2017-03-23 DIAGNOSIS — I152 Hypertension secondary to endocrine disorders: Secondary | ICD-10-CM

## 2017-03-23 LAB — CBC
HCT: 40.6 % (ref 36.0–46.0)
Hemoglobin: 13.4 g/dL (ref 12.0–15.0)
MCHC: 33 g/dL (ref 30.0–36.0)
MCV: 93.8 fl (ref 78.0–100.0)
Platelets: 231 10*3/uL (ref 150.0–400.0)
RBC: 4.33 Mil/uL (ref 3.87–5.11)
RDW: 13.6 % (ref 11.5–15.5)
WBC: 10.3 10*3/uL (ref 4.0–10.5)

## 2017-03-23 LAB — BASIC METABOLIC PANEL
BUN: 14 mg/dL (ref 6–23)
CALCIUM: 9.2 mg/dL (ref 8.4–10.5)
CO2: 35 meq/L — AB (ref 19–32)
CREATININE: 0.63 mg/dL (ref 0.40–1.20)
Chloride: 99 mEq/L (ref 96–112)
GFR: 97.14 mL/min (ref >60.00)
GLUCOSE: 186 mg/dL — AB (ref 70–99)
Potassium: 3.2 mEq/L — ABNORMAL LOW (ref 3.5–5.1)
Sodium: 140 mEq/L (ref 135–145)

## 2017-03-23 LAB — HEMOGLOBIN A1C: HEMOGLOBIN A1C: 8.1 % — AB (ref 4.6–6.5)

## 2017-03-23 MED ORDER — DULOXETINE HCL 20 MG PO CPEP: 20.0000 mg | ORAL_CAPSULE | Freq: Every day | ORAL | 3 refills | Status: DC

## 2017-03-23 NOTE — Progress Notes (Addendum)
HPI:  [name] is a pleasant 76 year old with past medical history of uncontrolled diabetes with neuropathy, hypertension, hyperlipidemia, CHF, osteoarthritis of multiple locations, chronic pain and depression here for several issues. Hx poor compliance, does not come in for routine follow up. She has chronic pain and mild depression consistently. Not taking the cymbalta. Was not sure if it would help. No SI. Issues with memory and word finding for years that she reports are worsening. Reports she often forgets what she is going to say. Seems to forget things easily. Does know the date, location, her birthday (today!) She reports is supposed to follow up with her cardiologist, but can't remember who he is. Thinks had her flu shot, but can't remember. No sig change in speech, weakness, numbness, vision changes, etc. She had a CT scan earlier this year. Did not take her bp meds today but reports usually takes them daily.  See pertinent pos itives and negatives per HPI.  Past Medical History:  Diagnosis Date  . AK (actinic keratosis)   . Anxiety    occasional  . Breast cancer ([place])    R breast, s/p radiation 34Gy/54fx 04/29/10-05/05/10  . CTS (carpal tunnel syndrome)    Left  . Diabetes ([place])   . Diastolic CHF ([place])    Dx w/ hospitalization 06/2015 for respiratory failure  . Facial asymmetry, acquired 08/20/2016   From injury  . Falls frequently    "can not pick left leg up"  . Foot fracture   . Hearing loss    bilateral hearing aides  . History of environmental allergies   . History of kidney stones 20 years ago  . Hypertension   . MDD (major depressive disorder)   . No blood products ([place])  09/11/2013  . Osteoarthritis    hx shoulder, knee, back, wrist pain; saw Dr. Wynelle Link   . RESTLESS LEGS SYNDROME 05/05/2007   Qualifier: Diagnosis of  By: Gwenette Greet MD, Armando Reichert   . RLS (restless legs syndrome)   . Sleep apnea     Past Surgical History:  Procedure Laterality Date   . APPENDECTOMY  age 24  . BREAST SURGERY  2012   rt breast lumpectomy  . flex signoidoscopy    . INCISION AND DRAINAGE PERIRECTAL ABSCESS    . JOINT REPLACEMENT Right 2006   knee  . TUBAL LIGATION  1979    Family History  Problem Relation Age of Onset  . Alcohol abuse Father   . Cancer Sister        breast  . Heart disease Paternal Grandfather   . Diabetes Other   . Obesity Other   . Heart disease Brother        Valve    Social History   Socioeconomic History  . Marital status: Married    Spouse name: None  . Number of children: 3  . Years of education: None  . Highest education level: None  Social Needs  . Financial resource strain: None  . Food insecurity - worry: None  . Food insecurity - inability: None  . Transportation needs - medical: None  . Transportation needs - non-medical: None  Occupational History  . None  Tobacco Use  . Smoking status: Never Smoker  . Smokeless tobacco: Never Used  Substance and Sexual Activity  . Alcohol use: No  . Drug use: No  . Sexual activity: Yes  Other Topics Concern  . None  Social History Narrative  Home Situation: lives with husband and granddaughter      Spiritual Beliefs: Jehovah Witness - no blood products                 Current Outpatient Medications:  .  ACCU-CHEK FASTCLIX LANCETS MISC, Use as directed to check blood sugar, Disp: 100 each, Rfl: 3 .  amLODipine (NORVASC) 5 MG tablet, Take 1 tablet (5 mg total) by mouth daily., Disp: 30 tablet, Rfl: 0 .  anastrozole (ARIMIDEX) 1 MG tablet, TAKE ONE TABLET BY MOUTH ONCE DAILY, Disp: 30 tablet, Rfl: 11 .  BD PEN NEEDLE NANO U/F 32G X 4 MM MISC, USE AS DIRECTED ONCE DAILY, Disp: 100 each, Rfl: 1 .  carvedilol (COREG) 3.125 MG tablet, TAKE ONE TABLET BY MOUTH TWICE DAILY WITH  A  MEAL, Disp: 180 tablet, Rfl: 0 .  cholecalciferol (VITAMIN D) 1000 units tablet, Take 1,000 Units by mouth 2 (two) times daily., Disp: , Rfl:  .  DULoxetine (CYMBALTA) 20 MG  capsule, Take 1 capsule (20 mg total) daily by mouth., Disp: 90 capsule, Rfl: 3 .  furosemide (LASIX) 40 MG tablet, Take 1 tablet (40 mg total) by mouth daily., Disp: 90 tablet, Rfl: 1 .  glucose blood (ACCU-CHEK GUIDE) test strip, Use as instructed to check blood sugar three times a day, Disp: 100 each, Rfl: 12 .  glucose blood (ONETOUCH VERIO) test strip, Use as instructed to check blood sugar three times a day, Disp: 100 each, Rfl: 12 .  Insulin Glargine (LANTUS SOLOSTAR) 100 UNIT/ML Solostar Pen, Inject 16 Units into the skin daily at 10 pm., Disp: 5 pen, Rfl: 3 .  Lancets (ONETOUCH ULTRASOFT) lancets, Use as instructed to check blood sugar, Disp: 100 each, Rfl: 11 .  lisinopril (PRINIVIL,ZESTRIL) 20 MG tablet, Take 1 tablet (20 mg total) by mouth daily., Disp: 90 tablet, Rfl: 1 .  lisinopril (PRINIVIL,ZESTRIL) 20 MG tablet, TAKE ONE TABLET BY MOUTH ONCE DAILY, Disp: 90 tablet, Rfl: 1 .  metFORMIN (GLUCOPHAGE) 1000 MG tablet, Take 1 tablet (1,000 mg total) by mouth 2 (two) times daily., Disp: 180 tablet, Rfl: 2 .  methocarbamol (ROBAXIN) 500 MG tablet, Take 500 mg by mouth every 6 (six) hours as needed for muscle spasms. Reported on 09/25/2015, Disp: , Rfl:  .  potassium chloride (KLOR-CON M10) 10 MEQ tablet, Take 1 tablet (10 mEq total) by mouth daily. PLEASE CONTACT OFFICE FOR ADDITIONAL REFILLS, Disp: 15 tablet, Rfl: 0 .  pramipexole (MIRAPEX) 1 MG tablet, TAKE ONE TABLET BY MOUTH TWICE DAILY, Disp: 180 tablet, Rfl: 1 .  Pyridoxine HCl (VITAMIN B-6 PO), Take by mouth., Disp: , Rfl:  .  traMADol (ULTRAM) 50 MG tablet, Take 0.5 - 1 tablet every 6-8 hours as needed for pain, Disp: 15 tablet, Rfl: 0 .  valACYclovir (VALTREX) 1000 MG tablet, Take 1 tablet (1,000 mg total) by mouth 3 (three) times daily., Disp: 21 tablet, Rfl: 0 .  vitamin B-12 100 MCG tablet, Take 1 tablet (100 mcg total) by mouth daily., Disp: 30 tablet, Rfl: 0  EXAM:  Vitals:   03/23/17 1308 03/23/17 1312  BP: (!) 160/90 (!)  160/90  Pulse: 80   Temp: 98.4 F (36.9 C)     Body mass index is 36.69 kg/m.  GENERAL: vitals reviewed and listed above, alert, oriented, appears well hydrated and in no acute distress  HEENT: atraumatic, conjunttiva clear, PERRLA, no obvious abnormalities on inspection of external nose and ears, L eyelid drop (chronic), mouth asymmetry (chronic)  NECK: no obvious masses on inspection  LUNGS: clear to auscultation bilaterally, no wheezes, rales or rhonchi, good air movement  CV: HRRR, no peripheral edema  MS: moves all extremities without noticeable abnormality  PSYCH: pleasant and cooperative, no obvious depression or anxiety, difficulty recalling some events, oriented x3, a little slow in responses, flat affect, CN II-XII grossly intact  ASSESSMENT AND PLAN:  Discussed the following assessment and plan:  Other chronic pain Mild episode of recurrent major depressive disorder ([place]) -follow up with specialist about pain -trial cymbalta 20mg  daily  Memory deficit - Plan: Ambulatory referral to Neurology Word finding difficulty - Plan: Ambulatory referral to Neurology -CT last few years for other issues, chronic microvascular dz -this seems to have been going on for a long time per her report - she wants to see neurology for eval - referral placed. Will likely need MRI/neuropsych testing - she opted to hold off until sees neurology.  Type 2 diabetes mellitus with diabetic neuropathy, with long-term current use of insulin ([place]) - Plan: Hemoglobin A1c Hypertension associated with diabetes ([place]) - Plan: Basic metabolic panel, CBC Chronic diastolic congestive heart failure ([place]) -labs per orders -did not take BP meds -she plans to see her cardiologist and my assistant will provide her with number advise recheck BP there or here in 1 month ON medications -lifestyle recs  -Patient advised to return or notify a doctor immediately if symptoms worsen or persist or new concerns  arise.  Patient Instructions  BEFORE YOU LEAVE: -? Flu shot - she thinks she had it, but wants to get it if did not have this year -labs -follow up: 3 months  Take all blood pressure medications daily. Follow up with your cardiologist in 1 month.  We placed a referral for you as discussed to the neurologist about the word finding/memory issues.. It usually takes about 1-2 weeks to process and schedule this referral. If you have not heard from [place] regarding this appointment in 2 weeks please contact our office.  Take the cymbalta daily at night - do not take tramadol with this medication.  We have ordered labs or studies at this visit. It can take up to 1-2 weeks for results and processing. IF results require follow up or explanation, we will call you with instructions. Clinically stable results will be released to your [facility]. If you have not heard from [place] or cannot find your results in [facility] in 2 weeks please contact our office at [phone].  If you are not yet signed up for [facility], please consider signing up.          Colin Benton R., DO

## 2017-03-23 NOTE — Patient Instructions (Signed)
BEFORE YOU LEAVE: -? Flu shot - she thinks she had it, but wants to get it if did not have this year -labs -follow up: 3 months  Take all blood pressure medications daily. Follow up with your cardiologist in 1 month.  We placed a referral for you as discussed to the neurologist about the word finding/memory issues.. It usually takes about 1-2 weeks to process and schedule this referral. If you have not heard from [place] regarding this appointment in 2 weeks please contact our office.  Take the cymbalta daily at night - do not take tramadol with this medication.  We have ordered labs or studies at this visit. It can take up to 1-2 weeks for results and processing. IF results require follow up or explanation, we will call you with instructions. Clinically stable results will be released to your [facility]. If you have not heard from [place] or cannot find your results in [facility] in 2 weeks please contact our office at [phone].  If you are not yet signed up for [facility], please consider signing up.

## 2017-03-26 ENCOUNTER — Encounter: Payer: Self-pay | Admitting: Neurology

## 2017-04-05 ENCOUNTER — Encounter: Payer: Self-pay | Admitting: Podiatry

## 2017-04-05 ENCOUNTER — Ambulatory Visit: Payer: Medicare HMO | Admitting: Podiatry

## 2017-04-05 DIAGNOSIS — E1142 Type 2 diabetes mellitus with diabetic polyneuropathy: Secondary | ICD-10-CM

## 2017-04-05 DIAGNOSIS — B351 Tinea unguium: Secondary | ICD-10-CM | POA: Diagnosis not present

## 2017-04-05 DIAGNOSIS — M79675 Pain in left toe(s): Secondary | ICD-10-CM

## 2017-04-05 DIAGNOSIS — M79674 Pain in right toe(s): Secondary | ICD-10-CM | POA: Diagnosis not present

## 2017-04-05 NOTE — Progress Notes (Signed)
   Subjective:    Patient ID: [name], female    DOB: [birth year], 76 y.o.   MRN: [id number]  HPI    Review of Systems     Objective:   Physical Exam        Assessment & Plan:

## 2017-04-05 NOTE — Progress Notes (Signed)
Patient ID: [name], female   DOB: [birth year], 76 y.o.   MRN: [id number]  Subjective: This patient presents today questioning debridement of her toenails. She said she is unable to form the service self. She is a known diabetic with neuropathy Denies smoking history  Patient's husband is present in the treatment room  Objective: Extremely hard of hearing orientated 3 DP and PT pulses 2/4 bilaterally Capillary reflex delay bilaterally Sensation to 10 g monofilament wire intact 8/8 right 87/8 left Vibratory sensation nonreactive bilaterally Ankle reflexes reactive bilaterally No open skin lesions bilaterally Atrophic skin with absent hair growth bilaterally Toenails elongated, discolored with palpable tenderness 6-10 Manual motor testing dorsi flexion, plantar flexion 5/5 bilaterally  Assessment: Diabetic peripheral neuropathy 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

## 2017-04-05 NOTE — Patient Instructions (Signed)

## 2017-04-06 ENCOUNTER — Telehealth: Payer: Self-pay | Admitting: *Deleted

## 2017-04-06 NOTE — Telephone Encounter (Signed)
Patient dropped off a hand-written note with the following blood sugar readings:  Tues-battery dead Sep 12, 1986 at 8am Thurs.-108 at 8:58 Fri-80 at 7:10 09-11-89 at 5:30 Sun-79 at 5:30 Mon-111 at 7:48am Tues-106 at 8 -Note also stated the pt had a cold and was sick all week.  Message given to Dr Maudie Mercury.

## 2017-04-07 NOTE — Telephone Encounter (Signed)
Patient is aware and will call back with a report in a week

## 2017-04-07 NOTE — Telephone Encounter (Signed)
These look great. Would continue current blood sugar medicines. Also, check postprandial (1-2 hours AFTER a meal) once daily for the next 1 week and call. Rotate meal (breakfast one day, lunch one day, dinner one day - etc). Thanks.

## 2017-04-13 MED FILL — LANTUS SOLOSTAR 100 UNITS/M: 100 | 90 days supply | Qty: 15 | Fill #3

## 2017-04-14 ENCOUNTER — Other Ambulatory Visit: Payer: Self-pay | Admitting: Family Medicine

## 2017-04-14 ENCOUNTER — Other Ambulatory Visit: Payer: Self-pay | Admitting: Cardiology

## 2017-05-08 ENCOUNTER — Other Ambulatory Visit: Payer: Self-pay | Admitting: Family Medicine

## 2017-05-27 ENCOUNTER — Encounter: Payer: Self-pay | Admitting: Family Medicine

## 2017-05-28 ENCOUNTER — Other Ambulatory Visit: Payer: Self-pay | Admitting: Family Medicine

## 2017-05-31 ENCOUNTER — Other Ambulatory Visit: Payer: Self-pay

## 2017-05-31 ENCOUNTER — Emergency Department (HOSPITAL_COMMUNITY)
Admission: EM | Admit: 2017-05-31 | Discharge: 2017-05-31 | Disposition: A | Discharge status: Home / Self Care | Payer: Medicare HMO | Attending: Emergency Medicine | Admitting: Emergency Medicine

## 2017-05-31 ENCOUNTER — Emergency Department (HOSPITAL_COMMUNITY): Payer: Medicare HMO

## 2017-05-31 ENCOUNTER — Encounter (HOSPITAL_COMMUNITY): Payer: Self-pay

## 2017-05-31 DIAGNOSIS — E1140 Type 2 diabetes mellitus with diabetic neuropathy, unspecified: Secondary | ICD-10-CM | POA: Diagnosis not present

## 2017-05-31 DIAGNOSIS — I5032 Chronic diastolic (congestive) heart failure: Secondary | ICD-10-CM | POA: Diagnosis not present

## 2017-05-31 DIAGNOSIS — Y9389 Activity, other specified: Secondary | ICD-10-CM | POA: Diagnosis not present

## 2017-05-31 DIAGNOSIS — Y929 Unspecified place or not applicable: Secondary | ICD-10-CM | POA: Insufficient documentation

## 2017-05-31 DIAGNOSIS — Z794 Long term (current) use of insulin: Secondary | ICD-10-CM | POA: Insufficient documentation

## 2017-05-31 DIAGNOSIS — S52514A Nondisplaced fracture of right radial styloid process, initial encounter for closed fracture: Secondary | ICD-10-CM | POA: Diagnosis not present

## 2017-05-31 DIAGNOSIS — I110 Hypertensive heart disease with heart failure: Secondary | ICD-10-CM | POA: Insufficient documentation

## 2017-05-31 DIAGNOSIS — Z853 Personal history of malignant neoplasm of breast: Secondary | ICD-10-CM | POA: Diagnosis not present

## 2017-05-31 DIAGNOSIS — Y999 Unspecified external cause status: Secondary | ICD-10-CM | POA: Insufficient documentation

## 2017-05-31 DIAGNOSIS — S52501A Unspecified fracture of the lower end of right radius, initial encounter for closed fracture: Secondary | ICD-10-CM

## 2017-05-31 DIAGNOSIS — I10 Essential (primary) hypertension: Secondary | ICD-10-CM | POA: Diagnosis not present

## 2017-05-31 DIAGNOSIS — Z79899 Other long term (current) drug therapy: Secondary | ICD-10-CM | POA: Insufficient documentation

## 2017-05-31 DIAGNOSIS — W19XXXA Unspecified fall, initial encounter: Secondary | ICD-10-CM | POA: Diagnosis not present

## 2017-05-31 DIAGNOSIS — S6991XA Unspecified injury of right wrist, hand and finger(s), initial encounter: Secondary | ICD-10-CM | POA: Diagnosis present

## 2017-05-31 DIAGNOSIS — M25531 Pain in right wrist: Secondary | ICD-10-CM | POA: Diagnosis not present

## 2017-05-31 MED ORDER — HYDROCODONE-ACETAMINOPHEN 5-325 MG PO TABS: 1.0000 | ORAL_TABLET | ORAL | 0 refills | Status: DC | PRN

## 2017-05-31 MED ORDER — IBUPROFEN 400 MG PO TABS
600.0000 mg | ORAL_TABLET | Freq: Once | ORAL | Status: AC
  Administered 2017-05-31: 600 mg via ORAL
  Filled 2017-05-31: qty 1

## 2017-05-31 MED ORDER — HYDROCODONE-ACETAMINOPHEN 5-325 MG PO TABS
1.0000 | ORAL_TABLET | Freq: Once | ORAL | Status: AC
  Administered 2017-05-31: 1 via ORAL
  Filled 2017-05-31: qty 1

## 2017-05-31 NOTE — ED Triage Notes (Signed)
Pt reports falling while trying to get on the toilet and hitting her wrist. Denies LOC or hitting her head.

## 2017-05-31 NOTE — Progress Notes (Signed)
Orthopedic Tech Progress Note Patient Details:  [name] Dec 22, 1940 [id number]  Ortho Devices Type of Ortho Device: Arm sling, Volar splint Ortho Device/Splint Location: rue Ortho Device/Splint Interventions: Application   Post Interventions Patient Tolerated: Well Instructions Provided: Care of device   [name] 05/31/2017, 1:27 PM

## 2017-05-31 NOTE — ED Provider Notes (Signed)
[facility] EMERGENCY DEPARTMENT Provider Note   CSN: [id number] Arrival date & time: 05/31/17  1113     History   Chief Complaint Chief Complaint  Patient presents with  . Fall    HPI [name] is a 77 y.o. female.  Pt presents to the ED today with a fall and right wrist pain.  The pt said she fell while trying to get on the toilet.  She denies any other injury.  Pt is leaving for [facility] tomorrow for her daughter's wedding, and has been busy this morning, so she did not take any of her meds today.  No LOC or head injury.      Past Medical History:  Diagnosis Date  . AK (actinic keratosis)   . Anxiety    occasional  . Breast cancer ([place])    R breast, s/p radiation 34Gy/30fx 04/29/10-05/05/10  . CTS (carpal tunnel syndrome)    Left  . Diabetes ([place])   . Diastolic CHF ([place])    Dx w/ hospitalization 06/2015 for respiratory failure  . Facial asymmetry, acquired 08/20/2016   From injury  . Falls frequently    "can not pick left leg up"  . Foot fracture   . Hearing loss    bilateral hearing aides  . History of environmental allergies   . History of kidney stones 20 years ago  . Hypertension   . MDD (major depressive disorder)   . No blood products ([place])  09/11/2013  . Osteoarthritis    hx shoulder, knee, back, wrist pain; saw Dr. Wynelle Link   . RESTLESS LEGS SYNDROME 05/05/2007   Qualifier: Diagnosis of  By: Gwenette Greet MD, Armando Reichert   . RLS (restless legs syndrome)   . Sleep apnea     Patient Active Problem List   Diagnosis Date Noted  . Facial asymmetry, acquired 08/20/2016  . Fracture of knee prosthesis ([place]) 08/01/2016  . Obesity 02/20/2016  . Chronic pain syndrome 02/19/2016  . Mild episode of recurrent major depressive disorder ([place]) 08/02/2015  . Chronic diastolic congestive heart failure ([place]) 07/16/2015  . Type 2 diabetes mellitus with diabetic neuropathy ([place]) 09/27/2014  . Hearing loss - has hearing aides, follow by  audiologist 10/11/2013  . No blood products ([place])  09/11/2013  . Disorder of bone and cartilage 06/10/2010  . Breast cancer of upper-outer quadrant of right female breast ([place]) 04/04/2010  . RESTLESS LEGS SYNDROME 05/05/2007  . Obstructive sleep apnea 03/01/2007  . Essential hypertension 01/27/2007  . Osteoarthritis 01/27/2007    Past Surgical History:  Procedure Laterality Date  . APPENDECTOMY  age 65  . BREAST SURGERY  2012   rt breast lumpectomy  . flex signoidoscopy    . INCISION AND DRAINAGE PERIRECTAL ABSCESS    . JOINT REPLACEMENT Right 2006   knee  . TOTAL KNEE ARTHROPLASTY Left 08/06/2014   Procedure: LEFT TOTAL KNEE ARTHROPLASTY;  Surgeon: Gaynelle Arabian, MD;  Location: [facility] ORS;  Service: Orthopedics;  Laterality: Left;  . TUBAL LIGATION  1979    OB History    No data available       Home Medications    Prior to Admission medications   Medication Sig Start Date End Date Taking? Authorizing Provider  ACCU-CHEK FASTCLIX LANCETS MISC Use as directed to check blood sugar 09/25/16   Colin Benton R, DO  amLODipine (NORVASC) 5 MG tablet Take 1 tablet (5 mg total) by mouth daily. 08/05/16   Regalado, Cassie Freer, MD  anastrozole (  ARIMIDEX) 1 MG tablet TAKE ONE TABLET BY MOUTH ONCE DAILY 01/04/17   Nicholas Lose, MD  BD PEN NEEDLE NANO U/F 32G X 4 MM MISC USE AS DIRECTED ONCE DAILY 01/25/17   Colin Benton R, DO  carvedilol (COREG) 3.125 MG tablet TAKE 1 TABLET BY MOUTH TWICE DAILY WITH MEALS 05/10/17   [name], DO  cholecalciferol (VITAMIN D) 1000 units tablet Take 1,000 Units by mouth 2 (two) times daily.    [provider]  DULoxetine (CYMBALTA) 20 MG capsule Take 1 capsule (20 mg total) daily by mouth. 03/23/17   [name], DO  furosemide (LASIX) 40 MG tablet TAKE ONE TABLET BY MOUTH ONCE DAILY 04/15/17   Colin Benton R, DO  glucose blood (ACCU-CHEK GUIDE) test strip Use as instructed to check blood sugar three times a day 09/25/16   [name], DO    glucose blood (ONETOUCH VERIO) test strip Use as instructed to check blood sugar three times a day 09/22/16   [name], DO  HYDROcodone-acetaminophen (NORCO/VICODIN) 5-325 MG tablet Take 1 tablet by mouth every 4 (four) hours as needed. 05/31/17   Isla Pence, MD  Insulin Glargine (LANTUS SOLOSTAR) 100 UNIT/ML Solostar Pen Inject 16 Units into the skin daily at 10 pm. Patient taking differently: Inject 19 Units daily at 10 pm into the skin.  07/14/16   [name], DO  KLOR-CON M10 10 MEQ tablet TAKE ONE TABLET BY MOUTH ONCE DAILY 04/14/17   Minus Breeding, MD  Lancets [facility] ULTRASOFT) lancets Use as instructed to check blood sugar 09/22/16   [name], DO  lisinopril (PRINIVIL,ZESTRIL) 20 MG tablet Take 1 tablet (20 mg total) by mouth daily. 04/06/16   [name], DO  lisinopril (PRINIVIL,ZESTRIL) 20 MG tablet TAKE ONE TABLET BY MOUTH ONCE DAILY 03/11/17   Minus Breeding, MD  metFORMIN (GLUCOPHAGE) 1000 MG tablet TAKE ONE TABLET BY MOUTH TWICE DAILY 05/28/17   [name], DO  methocarbamol (ROBAXIN) 500 MG tablet Take 500 mg by mouth every 6 (six) hours as needed for muscle spasms. Reported on 09/25/2015    [provider]  potassium chloride (KLOR-CON M10) 10 MEQ tablet Take 1 tablet (10 mEq total) by mouth daily. PLEASE CONTACT OFFICE FOR ADDITIONAL REFILLS 01/04/17   Minus Breeding, MD  pramipexole (MIRAPEX) 1 MG tablet TAKE ONE TABLET BY MOUTH TWICE DAILY 11/03/16   [name], DO  Pyridoxine HCl (VITAMIN B-6 PO) Take by mouth.    [provider]  traMADol (ULTRAM) 50 MG tablet Take 0.5 - 1 tablet every 6-8 hours as needed for pain 11/27/16   Nafziger, Tommi Rumps, NP  valACYclovir (VALTREX) 1000 MG tablet Take 1 tablet (1,000 mg total) by mouth 3 (three) times daily. 11/16/16   [name], DO  vitamin B-12 100 MCG tablet Take 1 tablet (100 mcg total) by mouth daily. 08/02/16   Regalado, Cassie Freer, MD    Family History Family History  Problem Relation Age of  Onset  . Alcohol abuse Father   . Cancer Sister        breast  . Heart disease Paternal Grandfather   . Diabetes Other   . Obesity Other   . Heart disease Brother        Valve    Social History Social History   Tobacco Use  . Smoking status: Never Smoker  . Smokeless tobacco: Never Used  Substance Use Topics  . Alcohol use: No  .  Drug use: No     Allergies   Sulfonamide derivatives and Other   Review of Systems Review of Systems  Musculoskeletal:       Right wrist pain  All other systems reviewed and are negative.    Physical Exam Updated Vital Signs BP (!) 241/115 (BP Location: Left Arm)   Pulse 83   Temp 97.9 F (36.6 C) (Oral)   Resp 16   Wt 88.5 kg (195 lb)   SpO2 93%   BMI 36.84 kg/m   Physical Exam  Constitutional: She is oriented to person, place, and time. She appears well-developed and well-nourished.  HENT:  Head: Normocephalic and atraumatic.  Right Ear: External ear normal.  Left Ear: External ear normal.  Nose: Nose normal.  Mouth/Throat: Oropharynx is clear and moist.  Eyes: Conjunctivae and EOM are normal. Pupils are equal, round, and reactive to light.  Neck: Normal range of motion. Neck supple.  Cardiovascular: Normal rate, regular rhythm, normal heart sounds and intact distal pulses.  Pulmonary/Chest: Effort normal and breath sounds normal.  Abdominal: Soft. Bowel sounds are normal.  Musculoskeletal:       Right wrist: She exhibits bony tenderness.  Neurological: She is alert and oriented to person, place, and time.  Skin: Skin is warm. Capillary refill takes less than 2 seconds.  Psychiatric: She has a normal mood and affect. Her behavior is normal. Judgment and thought content normal.  Nursing note and vitals reviewed.    ED Treatments / Results  Labs (all labs ordered are listed, but only abnormal results are displayed) Labs Reviewed - No data to display  EKG  EKG Interpretation None       Radiology Dg Wrist  Complete Right  Result Date: 05/31/2017 CLINICAL DATA:  Right wrist pain since a fall in a bathroom today. Initial encounter. EXAM: RIGHT WRIST - COMPLETE 3+ VIEW COMPARISON:  Plain films right wrist 05/31/2015. FINDINGS: The patient has a slightly impacted fracture of the distal radius. Nondisplaced component of the fracture extends to the base of the radial styloid at the radiocarpal joint. No other acute abnormality is identified. Chondrocalcinosis of the triangular fibrocartilage is noted. IMPRESSION: Slightly impacted fracture of the distal right radius. Electronically Signed   By: Inge Rise M.D.   On: 05/31/2017 12:02    Procedures Procedures (including critical care time)  Medications Ordered in ED Medications  HYDROcodone-acetaminophen (NORCO/VICODIN) 5-325 MG per tablet 1 tablet (not administered)  ibuprofen (ADVIL,MOTRIN) tablet 600 mg (not administered)     Initial Impression / Assessment and Plan / ED Course  I have reviewed the triage vital signs and the nursing notes.  Pertinent labs & imaging results that were available during my care of the patient were reviewed by me and considered in my medical decision making (see chart for details).    Pt placed in a volar splint prior to d/c.  She is instructed to f/u with Dr. Caralyn Guile (hand).  She knows to take her normal home meds when she gets home.  Return for any concerns.  Final Clinical Impressions(s) / ED Diagnoses   Final diagnoses:  Fall, initial encounter  Closed fracture of distal end of right radius, unspecified fracture morphology, initial encounter  Essential hypertension    ED Discharge Orders        Ordered    HYDROcodone-acetaminophen (NORCO/VICODIN) 5-325 MG tablet  Every 4 hours PRN     05/31/17 1234       Isla Pence, MD 05/31/17 1235

## 2017-05-31 NOTE — Progress Notes (Signed)
Orthopedic Tech Progress Note Patient Details:  [name] 09-Dec-1940 [id number]  Patient ID: [name], female   DOB: [birth year], 77 y.o.   MRN: [id number]   [name] 05/31/2017, 1:31 PM As ordered by Dr. Gilford Raid

## 2017-05-31 NOTE — Discharge Instructions (Signed)
Take a stool softener with pain medication.  Take your normal medications when you get home.

## 2017-06-01 ENCOUNTER — Telehealth: Payer: Self-pay | Admitting: Emergency Medicine

## 2017-06-01 DIAGNOSIS — Z9981 Dependence on supplemental oxygen: Secondary | ICD-10-CM | POA: Diagnosis not present

## 2017-06-01 DIAGNOSIS — S0990XA Unspecified injury of head, initial encounter: Secondary | ICD-10-CM | POA: Diagnosis not present

## 2017-06-01 DIAGNOSIS — M79609 Pain in unspecified limb: Secondary | ICD-10-CM | POA: Diagnosis not present

## 2017-06-01 DIAGNOSIS — I509 Heart failure, unspecified: Secondary | ICD-10-CM | POA: Diagnosis not present

## 2017-06-01 DIAGNOSIS — E119 Type 2 diabetes mellitus without complications: Secondary | ICD-10-CM | POA: Diagnosis not present

## 2017-06-01 DIAGNOSIS — W19XXXA Unspecified fall, initial encounter: Secondary | ICD-10-CM | POA: Diagnosis not present

## 2017-06-01 DIAGNOSIS — I110 Hypertensive heart disease with heart failure: Secondary | ICD-10-CM | POA: Diagnosis not present

## 2017-06-01 DIAGNOSIS — R030 Elevated blood-pressure reading, without diagnosis of hypertension: Secondary | ICD-10-CM | POA: Diagnosis not present

## 2017-06-01 DIAGNOSIS — J9611 Chronic respiratory failure with hypoxia: Secondary | ICD-10-CM | POA: Diagnosis not present

## 2017-06-01 DIAGNOSIS — S62101D Fracture of unspecified carpal bone, right wrist, subsequent encounter for fracture with routine healing: Secondary | ICD-10-CM | POA: Diagnosis not present

## 2017-06-01 DIAGNOSIS — M25511 Pain in right shoulder: Secondary | ICD-10-CM | POA: Diagnosis not present

## 2017-06-01 DIAGNOSIS — Z043 Encounter for examination and observation following other accident: Secondary | ICD-10-CM | POA: Diagnosis not present

## 2017-06-01 DIAGNOSIS — S40011A Contusion of right shoulder, initial encounter: Secondary | ICD-10-CM | POA: Diagnosis not present

## 2017-06-01 NOTE — Telephone Encounter (Signed)
CM received a VM from [place] in [place] to confirm out of state prescription.  CM noted that pt was D/C yesterday with Norco.  Per Dr. Elyse Hsu note pt was flying to [facility] today for her daughters wedding.  Confirmed with [place] situation.  No further CM needs noted at this time.

## 2017-06-10 ENCOUNTER — Telehealth: Payer: Self-pay

## 2017-06-10 NOTE — Telephone Encounter (Signed)
Is there anything needed from me on this?  If not, please closed out and no need to rewrap to me.  Thanks!

## 2017-06-10 NOTE — Telephone Encounter (Signed)
Would advise follow-up with Dr. Apolonio Schneiders, since that is what they recommended.  I would think she has already had a referral?  If not okay to refer.

## 2017-06-10 NOTE — Telephone Encounter (Signed)
It seems pt is wanting to know if she should follow up with you or if she needs referral to Dr. Caralyn Guile.   Dr. Maudie Mercury - Please advise. Thanks!

## 2017-06-10 NOTE — Telephone Encounter (Signed)
Spoke with pt and advised her to follow up with Dr. Caralyn Guile, per Dr. Maudie Mercury and ED notes. Provided pt with name and phone number to his office. She will call if they advise referral. Nothing further needed at this time.

## 2017-06-10 NOTE — Telephone Encounter (Signed)
Per ED notes: Pt placed in a volar splint prior to d/c.  She is instructed to f/u with Dr. Caralyn Guile (hand).  She knows to take her normal home meds when she gets home.  Return for any concerns.  Copied from [place] [id number]. Topic: Inquiry >> Jun 10, 2017 10:57 AM Oliver Pila B wrote: Reason for CRM: pt's husband called and stated the pt broker her arm, pt went to hospital and the doctor recommended to see another physician for her arm, pt is wanting to know if she needs a referral to see another physician, or come in to pcp to be seen first, contact pt to advise

## 2017-06-11 ENCOUNTER — Ambulatory Visit: Payer: Medicare HMO | Admitting: Neurology

## 2017-06-15 DIAGNOSIS — S52551A Other extraarticular fracture of lower end of right radius, initial encounter for closed fracture: Secondary | ICD-10-CM | POA: Diagnosis not present

## 2017-06-18 ENCOUNTER — Other Ambulatory Visit: Payer: Self-pay | Admitting: *Deleted

## 2017-06-18 ENCOUNTER — Ambulatory Visit: Payer: Self-pay | Admitting: *Deleted

## 2017-06-18 MED ORDER — GLUCOSE BLOOD VI STRP: ORAL_STRIP | 5 refills | Status: DC

## 2017-06-18 MED ORDER — INSULIN GLARGINE 100 UNIT/ML SOLOSTAR PEN: 19.0000 [IU] | PEN_INJECTOR | Freq: Every day | SUBCUTANEOUS | 1 refills | Status: DC

## 2017-06-18 MED FILL — LANTUS SOLOSTAR 100 UNITS/M: 100 | 47 days supply | Qty: 9 | Fill #0

## 2017-06-18 NOTE — Telephone Encounter (Signed)
Rx done. 

## 2017-06-18 NOTE — Telephone Encounter (Signed)
Rxs done. 

## 2017-06-18 NOTE — Telephone Encounter (Signed)
Pt called to get refill on lantus, after I told her to contact pharmacy, the call went dead. There was not a sign that the call was dropped ( it never disconnected). I attempted to call back, phone rang 3 times the just stopped ringing. I do not know if the disconnect was deliberate, or if there is a problem. Please contact for follow up      Call to patient- she is requesting a refill on her Lantus Solostar pen 100 units. She would like that sent to [facility] outpatient pharmacy. She states she is still using 19 units in the evening. She also is requesting test strips for the [employer] meter sent to [employer].  Reminded patient of her appointment 06/24/2017 and verified the time with her.

## 2017-06-21 NOTE — Progress Notes (Signed)
HPI:  [name] is a pleasant 77 y.o. here for follow up. Chronic medical problems summarized below were reviewed for changes. History of poor compliance.  She is recovering from a fracture right hand, sees orthopedic specialist for this.  She had a flare in her back pain a few days ago, she sees Dr. Dossie Der for this.  Husband seems to think she needs more opioid pain medications for this.  Tylenol and other options for pain do not work.  They have not seen a neurologist.  Husband reports they have issues with her cell phone at home dropping calls.  They have a land line, but he has not hooked a phone up to it.  Reports scheduling appointments is difficult because of their cell phone reception, so they often do not even bother to return calls.  Seems this is what happened with the neurology referral.  Still has issues with daytime somnolence.  He does not believe the patient has sleep apnea.  He reports that sleep specialist was not able to get her CPAP right, so she does not use it.  Sleeps poorly.  She thinks she is taking the Cymbalta, however she is not sure.  Denies CP, low blood sugars, SOB, DOE, worsening depression, further falls since last visit, treatment intolerance or new symptoms. Due for labs, statin, ? Pt, ? Neurology for gait/memory.  Discussed all of these.  Not interested in statin at this time.  Seem resistant to the idea of treating the sleep apnea.  Neurologist tried to contact them.  They are agreeable to labs today. Sent referral to neurology last visit. PT did not return their call per notes.  Diabetes with polyneuropathy/CKD, HTN, HLD, Obesity: -meds: insulin, metformin, acei,  -hx poor compliance -sees podiatrist -not on asa - hx frequent falls -not on statin, declined  RLS: -on mirapex prior to establishing with me  Hx Sleep apnea, dCHF, HTN, HLD: -sees pulmonologist for sleep apnea -sees Dr. Percival Spanish, cardiology -meds: amlodipine, coreg, lasix, lisinopril  Hx  Depression and chronic pain: -Osteoarthritis and DDD -sees [place] ortho -cymbalta helps -I have advised against unless very cautious supervision and do not rx opiods given her co-morbidities, age and frequent falls along with chronic rather then acute pain  Hx of Breast Ca: -sees oncologist for surveillance, follow up -on arimidex  ROS: See pertinent positives and negatives per HPI.  Past Medical History:  Diagnosis Date  . AK (actinic keratosis)   . Anxiety    occasional  . Breast cancer ([place])    R breast, s/p radiation 34Gy/12fx 04/29/10-05/05/10  . CTS (carpal tunnel syndrome)    Left  . Diabetes ([place])   . Diastolic CHF ([place])    Dx w/ hospitalization 06/2015 for respiratory failure  . Facial asymmetry, acquired 08/20/2016   From injury  . Falls frequently    "can not pick left leg up"  . Foot fracture   . Hearing loss    bilateral hearing aides  . History of environmental allergies   . History of kidney stones 20 years ago  . Hypertension   . MDD (major depressive disorder)   . No blood products ([employer] [employer])  09/11/2013  . Osteoarthritis    hx shoulder, knee, back, wrist pain; saw Dr. Wynelle Link   . RESTLESS LEGS SYNDROME 05/05/2007   Qualifier: Diagnosis of  By: Gwenette Greet MD, Armando Reichert   . RLS (restless legs syndrome)   . Sleep apnea     Past Surgical History:  Procedure  Laterality Date  . APPENDECTOMY  age 37  . BREAST SURGERY  2012   rt breast lumpectomy  . flex signoidoscopy    . INCISION AND DRAINAGE PERIRECTAL ABSCESS    . JOINT REPLACEMENT Right 2006   knee  . TOTAL KNEE ARTHROPLASTY Left 08/06/2014   Procedure: LEFT TOTAL KNEE ARTHROPLASTY;  Surgeon: Gaynelle Arabian, MD;  Location: [facility];  Service: Orthopedics;  Laterality: Left;  . TUBAL LIGATION  1979    Family History  Problem Relation Age of Onset  . Alcohol abuse Father   . Cancer Sister        breast  . Heart disease Paternal Grandfather   . Diabetes Other   . Obesity Other   . Heart disease  Brother        Valve    Social History   Socioeconomic History  . Marital status: Married    Spouse name: None  . Number of children: 3  . Years of education: None  . Highest education level: None  Social Needs  . Financial resource strain: None  . Food insecurity - worry: None  . Food insecurity - inability: None  . Transportation needs - medical: None  . Transportation needs - non-medical: None  Occupational History  . None  Tobacco Use  . Smoking status: Never Smoker  . Smokeless tobacco: Never Used  Substance and Sexual Activity  . Alcohol use: No  . Drug use: No  . Sexual activity: Yes  Other Topics Concern  . None  Social History Narrative         Home Situation: lives with husband and granddaughter      Spiritual Beliefs: Jehovah Witness - no blood products                 Current Outpatient Medications:  .  ACCU-CHEK FASTCLIX LANCETS MISC, Use as directed to check blood sugar, Disp: 100 each, Rfl: 3 .  amLODipine (NORVASC) 5 MG tablet, Take 1 tablet (5 mg total) by mouth daily., Disp: 30 tablet, Rfl: 0 .  anastrozole (ARIMIDEX) 1 MG tablet, TAKE ONE TABLET BY MOUTH ONCE DAILY, Disp: 30 tablet, Rfl: 11 .  BD PEN NEEDLE NANO U/F 32G X 4 MM MISC, USE AS DIRECTED ONCE DAILY, Disp: 100 each, Rfl: 1 .  carvedilol (COREG) 3.125 MG tablet, TAKE 1 TABLET BY MOUTH TWICE DAILY WITH MEALS, Disp: 180 tablet, Rfl: 1 .  cholecalciferol (VITAMIN D) 1000 units tablet, Take 1,000 Units by mouth 2 (two) times daily., Disp: , Rfl:  .  DULoxetine (CYMBALTA) 20 MG capsule, Take 1 capsule (20 mg total) daily by mouth., Disp: 90 capsule, Rfl: 3 .  furosemide (LASIX) 40 MG tablet, TAKE ONE TABLET BY MOUTH ONCE DAILY, Disp: 90 tablet, Rfl: 1 .  glucose blood (ACCU-CHEK AVIVA PLUS) test strip, Use as instructed three times a day, Disp: 100 each, Rfl: 5 .  Insulin Glargine (LANTUS SOLOSTAR) 100 UNIT/ML Solostar Pen, Inject 19 Units into the skin daily at 10 pm., Disp: 3 pen, Rfl: 1 .   KLOR-CON M10 10 MEQ tablet, TAKE ONE TABLET BY MOUTH ONCE DAILY, Disp: 30 tablet, Rfl: 11 .  Lancets (ONETOUCH ULTRASOFT) lancets, Use as instructed to check blood sugar, Disp: 100 each, Rfl: 11 .  lisinopril (PRINIVIL,ZESTRIL) 20 MG tablet, TAKE ONE TABLET BY MOUTH ONCE DAILY, Disp: 90 tablet, Rfl: 1 .  metFORMIN (GLUCOPHAGE) 1000 MG tablet, TAKE ONE TABLET BY MOUTH TWICE DAILY, Disp: 180 tablet, Rfl: 1 .  methocarbamol (  ROBAXIN) 500 MG tablet, Take 500 mg by mouth every 6 (six) hours as needed for muscle spasms. Reported on 09/25/2015, Disp: , Rfl:  .  pramipexole (MIRAPEX) 1 MG tablet, TAKE ONE TABLET BY MOUTH TWICE DAILY, Disp: 180 tablet, Rfl: 1 .  Pyridoxine HCl (VITAMIN B-6 PO), Take by mouth., Disp: , Rfl:  .  vitamin B-12 100 MCG tablet, Take 1 tablet (100 mcg total) by mouth daily., Disp: 30 tablet, Rfl: 0  EXAM:  Vitals:   06/22/17 0908  BP: 140/90  Pulse: 80  Temp: 98.1 F (36.7 C)    Body mass index is 39.09 kg/m.  GENERAL: vitals reviewed and listed above, alert, oriented, appears well hydrated and in no acute distress  HEENT: atraumatic, conjunttiva clear, no obvious abnormalities on inspection of external nose and ears  NECK: no obvious masses on inspection  LUNGS: clear to auscultation bilaterally, no wheezes, rales or rhonchi, good air movement  CV: HRRR, no peripheral edema  MS: moves all extremities without noticeable abnormality  PSYCH: pleasant and cooperative, no obvious depression or anxiety  ASSESSMENT AND PLAN:  Discussed the following assessment and plan:  Essential hypertension - Plan: Basic metabolic panel, CBC  Type 2 diabetes mellitus with diabetic neuropathy, with long-term current use of insulin (HCC) - Plan: Hemoglobin G8Q  Chronic diastolic congestive heart failure (HCC)  Mild episode of recurrent major depressive disorder (HCC)  Other chronic pain  Obstructive sleep apnea  -complicated PMH, challenging to care for with barriers  to compliance. Suggested getting cheap land phone and husband reports already pays for land line and has phone - just needs to hook it up. Advised this is of upmost importance in terms of good communication with health care team. He feels his hearing aides and those of his wife are working well - but they did not wear them today - making the appt more challenging. -tried to explain symptoms, risks, reasons for management of sleep apnea. Stressed very important for her overall health, improved energy and may help pain as well. Husband finally agrees to see sleep doc again. Number provided. -sees [place] ortho for back pain, he is frustrated with pain management, discussed various options and offered referral to pain specialist. They opted to see current provider first then decide. Consider pt for gait/stability as well. -discuss risks/benefit statin again. Declined -advised they can contact neuro for appt - number provided. -advised to bring all medications to every appt as they are unsure what she is taking today. They did know that she did not take blood pressure medications today. -labs per orders -discussed possibility of her seeing geriatrician as has complicated issues, long med list and would benefit from lengthier appointments. They are interested in transfer to geriatrician for her care. I will check into this and place referral. -Patient advised to return or notify a doctor immediately if symptoms worsen or persist or new concerns arise.  Patient Instructions  BEFORE YOU LEAVE: -labs -number for sleep specialist she saw -number for neurology office -follow up: 3-4 months  Please follow up with Dr. Nelva Bush about the back pain.  Please call to schedule neurology appointment for gait and memory issues.  Please call sleep specialist for appointment about sleep apnea.  We have ordered labs or studies at this visit. It can take up to 1-2 weeks for results and processing. IF results require follow  up or explanation, we will call you with instructions. Clinically stable results will be released to your [facility]. If you  have not heard from [place] or cannot find your results in [facility] in 2 weeks please contact our office at [phone].  If you are not yet signed up for [facility], please consider signing up.   We will look into referral to a geriatrician.  Please bring all the medications you are taking to every doctor appointment.        [name], DO

## 2017-06-22 ENCOUNTER — Ambulatory Visit (INDEPENDENT_AMBULATORY_CARE_PROVIDER_SITE_OTHER): Payer: Medicare HMO | Admitting: Family Medicine

## 2017-06-22 ENCOUNTER — Encounter: Payer: Self-pay | Admitting: Family Medicine

## 2017-06-22 VITALS — BP 140/90 | HR 80 | Temp 98.1°F | Ht 61.0 in | Wt 206.9 lb

## 2017-06-22 DIAGNOSIS — G8929 Other chronic pain: Secondary | ICD-10-CM

## 2017-06-22 DIAGNOSIS — F330 Major depressive disorder, recurrent, mild: Secondary | ICD-10-CM

## 2017-06-22 DIAGNOSIS — E1140 Type 2 diabetes mellitus with diabetic neuropathy, unspecified: Secondary | ICD-10-CM | POA: Diagnosis not present

## 2017-06-22 DIAGNOSIS — I10 Essential (primary) hypertension: Secondary | ICD-10-CM

## 2017-06-22 DIAGNOSIS — I5032 Chronic diastolic (congestive) heart failure: Secondary | ICD-10-CM | POA: Diagnosis not present

## 2017-06-22 DIAGNOSIS — Z794 Long term (current) use of insulin: Secondary | ICD-10-CM

## 2017-06-22 DIAGNOSIS — G4733 Obstructive sleep apnea (adult) (pediatric): Secondary | ICD-10-CM | POA: Diagnosis not present

## 2017-06-22 LAB — CBC
HCT: 39.3 % (ref 36.0–46.0)
Hemoglobin: 13 g/dL (ref 12.0–15.0)
MCHC: 33.2 g/dL (ref 30.0–36.0)
MCV: 93.6 fl (ref 78.0–100.0)
Platelets: 279 10*3/uL (ref 150.0–400.0)
RBC: 4.2 Mil/uL (ref 3.87–5.11)
RDW: 14 % (ref 11.5–15.5)
WBC: 9 10*3/uL (ref 4.0–10.5)

## 2017-06-22 LAB — BASIC METABOLIC PANEL
BUN: 18 mg/dL (ref 6–23)
CO2: 34 mEq/L — ABNORMAL HIGH (ref 19–32)
CREATININE: 0.6 mg/dL (ref 0.40–1.20)
Calcium: 9 mg/dL (ref 8.4–10.5)
Chloride: 100 mEq/L (ref 96–112)
GFR: 102.7 mL/min (ref >60.00)
Glucose, Bld: 208 mg/dL — ABNORMAL HIGH (ref 70–99)
Potassium: 3.7 mEq/L (ref 3.5–5.1)
Sodium: 140 mEq/L (ref 135–145)

## 2017-06-22 LAB — HEMOGLOBIN A1C: HEMOGLOBIN A1C: 8.1 % — AB (ref 4.6–6.5)

## 2017-06-22 NOTE — Patient Instructions (Addendum)
BEFORE YOU LEAVE: -labs -number for sleep specialist she saw -number for neurology office -follow up: 3-4 months  Please follow up with Dr. Nelva Bush about the back pain.  Please call to schedule neurology appointment for gait and memory issues.  Please call sleep specialist for appointment about sleep apnea.  We have ordered labs or studies at this visit. It can take up to 1-2 weeks for results and processing. IF results require follow up or explanation, we will call you with instructions. Clinically stable results will be released to your [facility]. If you have not heard from [place] or cannot find your results in [facility] in 2 weeks please contact our office at [phone].  If you are not yet signed up for [facility], please consider signing up.   We will look into referral to a geriatrician.  Please bring all the medications you are taking to every doctor appointment.

## 2017-06-24 ENCOUNTER — Ambulatory Visit: Payer: Medicare HMO | Admitting: Family Medicine

## 2017-06-25 DIAGNOSIS — M5136 Other intervertebral disc degeneration, lumbar region: Secondary | ICD-10-CM | POA: Diagnosis not present

## 2017-06-29 DIAGNOSIS — S52551D Other extraarticular fracture of lower end of right radius, subsequent encounter for closed fracture with routine healing: Secondary | ICD-10-CM | POA: Diagnosis not present

## 2017-06-29 DIAGNOSIS — S52551A Other extraarticular fracture of lower end of right radius, initial encounter for closed fracture: Secondary | ICD-10-CM | POA: Diagnosis not present

## 2017-07-02 ENCOUNTER — Other Ambulatory Visit: Payer: Self-pay | Admitting: Family Medicine

## 2017-07-05 ENCOUNTER — Telehealth: Payer: Self-pay

## 2017-07-05 NOTE — Telephone Encounter (Signed)
I spoke with patient's husband to see if new patient packet was received. He was unsure, so I asked that patient call the office to confirm that she received packet.

## 2017-07-06 NOTE — Telephone Encounter (Signed)
Spoke with patient's husband.  Cancelled new patient appointment. As per referral from current PCP, Caren Griffins spoke with patient and informed her that she would be transferring care from Dr.Kim to [facility] and patient did not wish to transfer care

## 2017-07-08 DIAGNOSIS — M5136 Other intervertebral disc degeneration, lumbar region: Secondary | ICD-10-CM | POA: Diagnosis not present

## 2017-07-13 ENCOUNTER — Ambulatory Visit: Payer: Medicare HMO | Admitting: Nurse Practitioner

## 2017-07-21 ENCOUNTER — Telehealth: Payer: Self-pay | Admitting: Family Medicine

## 2017-07-21 NOTE — Telephone Encounter (Signed)
Copied from [place]. Topic: Inquiry >> Jul 21, 2017  3:00 PM Conception Chancy, NT wrote: Patient is calling to let Dr. Maudie Mercury know that she started taking Lantus 2 days ago. She states she has been 23 instead of 19. She states she has started taking the 19 now.

## 2017-07-22 NOTE — Telephone Encounter (Signed)
I am confused by this message. Can you please call to clarify? Did she increase the lantus as we advised?  Update med list to reflect what she is taking. Advise checking fasting bs daily and call her back in 1 week to see what fasting levels are. Thanks.

## 2017-07-23 DIAGNOSIS — G479 Sleep disorder, unspecified: Secondary | ICD-10-CM | POA: Diagnosis not present

## 2017-07-23 DIAGNOSIS — M5136 Other intervertebral disc degeneration, lumbar region: Secondary | ICD-10-CM | POA: Diagnosis not present

## 2017-07-27 DIAGNOSIS — S52551D Other extraarticular fracture of lower end of right radius, subsequent encounter for closed fracture with routine healing: Secondary | ICD-10-CM | POA: Diagnosis not present

## 2017-07-28 NOTE — Telephone Encounter (Signed)
Left message on machine for patient to return our call.  CRM created 

## 2017-08-02 MED FILL — LANTUS SOLOSTAR 100 UNITS/M: 100 | 47 days supply | Qty: 9 | Fill #1

## 2017-08-23 ENCOUNTER — Other Ambulatory Visit: Payer: Self-pay | Admitting: Family Medicine

## 2017-08-23 ENCOUNTER — Telehealth: Payer: Self-pay | Admitting: Family Medicine

## 2017-08-23 DIAGNOSIS — Z1231 Encounter for screening mammogram for malignant neoplasm of breast: Secondary | ICD-10-CM

## 2017-08-23 DIAGNOSIS — N6452 Nipple discharge: Secondary | ICD-10-CM

## 2017-08-23 NOTE — Telephone Encounter (Signed)
Okay to place referral

## 2017-08-23 NOTE — Telephone Encounter (Signed)
Copied from [place] [id number]. Topic: Referral - Request >> Aug 23, 2017  2:11 PM Carolyn Stare wrote:  Pt call the breast  center to make an appt and since she has been leaking from her breast she need to have a diagnostic mamo so there for she will need a referral   [phone]

## 2017-08-24 ENCOUNTER — Other Ambulatory Visit: Payer: Self-pay | Admitting: Family Medicine

## 2017-08-24 NOTE — Addendum Note (Signed)
Addended by: Lahoma Crocker A on: 08/24/2017 11:59 AM   Modules accepted: Orders

## 2017-08-24 NOTE — Telephone Encounter (Signed)
Pt returning call. MM and [place] is on the right side. Please call pt back [phone].

## 2017-08-24 NOTE — Telephone Encounter (Signed)
I left a message for the pt to return my call with the affected side and the order can be placed.

## 2017-08-24 NOTE — Telephone Encounter (Signed)
STAT orders placed for mammogram and ultrasound.  I left a detailed message for the pt to call back with a week notes of what her fasting blood sugars have been and her insulin dosage.  CRM created.

## 2017-08-25 ENCOUNTER — Other Ambulatory Visit: Payer: Self-pay | Admitting: Family Medicine

## 2017-08-25 NOTE — Progress Notes (Signed)
Please briefly explain why patient needs home oxygen:    Cardiology Office Note   Date:  08/26/2017   ID:  [name], DOB [birth year], MRN [id number]  PCP:  [name], DO  Cardiologist:   Minus Breeding, MD   No chief complaint on file.     History of Present Illness: [name] is a 77 y.o. female who presents for follow-up of shortness of breath.  She is had increased weight.  I treated her with diuretic.  She was in the hospital last year.  She had an echo that was unremarkable.  Unfortunately she is not wearing a hearing aid today and she is almost completely deaf.  It is very hard to get a history.  She is dyspneic with mild exertion.  She is very somnolent and her husband complains that she wants to sleep all the time.  Is not describing any chest discomfort, neck or arm discomfort.  She has had some mild lower increased swelling.    Past Medical History:  Diagnosis Date  . AK (actinic keratosis)   . Anxiety    occasional  . Breast cancer ([place])    R breast, s/p radiation 34Gy/72fx 04/29/10-05/05/10  . CTS (carpal tunnel syndrome)    Left  . Diabetes ([place])   . Diastolic CHF ([place])    Dx w/ hospitalization 06/2015 for respiratory failure  . Facial asymmetry, acquired 08/20/2016   From injury  . Falls frequently    "can not pick left leg up"  . Foot fracture   . Hearing loss    bilateral hearing aides  . History of environmental allergies   . History of kidney stones 20 years ago  . Hypertension   . MDD (major depressive disorder)   . No blood products ([employer])  09/11/2013  . Osteoarthritis    hx shoulder, knee, back, wrist pain; saw Dr. Wynelle Link   . RESTLESS LEGS SYNDROME 05/05/2007   Qualifier: Diagnosis of  By: Gwenette Greet MD, Armando Reichert   . RLS (restless legs syndrome)   . Sleep apnea     Past Surgical History:  Procedure Laterality Date  . APPENDECTOMY  age 70  . BREAST SURGERY  2012   rt breast lumpectomy  . flex signoidoscopy    . INCISION  AND DRAINAGE PERIRECTAL ABSCESS    . JOINT REPLACEMENT Right 2006   knee  . TOTAL KNEE ARTHROPLASTY Left 08/06/2014   Procedure: LEFT TOTAL KNEE ARTHROPLASTY;  Surgeon: Gaynelle Arabian, MD;  Location: [facility];  Service: Orthopedics;  Laterality: Left;  . TUBAL LIGATION  1979     Current Outpatient Medications  Medication Sig Dispense Refill  . ACCU-CHEK FASTCLIX LANCETS MISC Use as directed to check blood sugar 100 each 3  . amLODipine (NORVASC) 5 MG tablet Take 1 tablet (5 mg total) by mouth daily. 30 tablet 0  . anastrozole (ARIMIDEX) 1 MG tablet TAKE ONE TABLET BY MOUTH ONCE DAILY 30 tablet 11  . BD PEN NEEDLE NANO U/F 32G X 4 MM MISC USE AS DIRECTED ONCE DAILY 100 each 1  . carvedilol (COREG) 3.125 MG tablet TAKE 1 TABLET BY MOUTH TWICE DAILY WITH MEALS 180 tablet 1  . cholecalciferol (VITAMIN D) 1000 units tablet Take 1,000 Units by mouth 2 (two) times daily.    . furosemide (LASIX) 40 MG tablet TAKE 1 TABLET BY MOUTH ONCE DAILY 90 tablet 0  . glucose blood (ACCU-CHEK AVIVA PLUS) test strip Use as instructed three times a  day 100 each 5  . Insulin Glargine (LANTUS SOLOSTAR) 100 UNIT/ML Solostar Pen Inject 19 Units into the skin daily at 10 pm. (Patient taking differently: Inject 22 Units into the skin daily at 10 pm. ) 3 pen 1  . KLOR-CON M10 10 MEQ tablet TAKE ONE TABLET BY MOUTH ONCE DAILY 30 tablet 11  . Lancets (ONETOUCH ULTRASOFT) lancets Use as instructed to check blood sugar 100 each 11  . lisinopril (PRINIVIL,ZESTRIL) 20 MG tablet TAKE ONE TABLET BY MOUTH ONCE DAILY 90 tablet 1  . metFORMIN (GLUCOPHAGE) 1000 MG tablet TAKE ONE TABLET BY MOUTH TWICE DAILY 180 tablet 1  . methocarbamol (ROBAXIN) 500 MG tablet Take 500 mg by mouth every 6 (six) hours as needed for muscle spasms. Reported on 09/25/2015    . pramipexole (MIRAPEX) 1 MG tablet TAKE 1 TABLET BY MOUTH TWICE DAILY 180 tablet 1   No current facility-administered medications for this visit.     Allergies:   Sulfa  antibiotics; Sulfonamide derivatives; and Other    ROS:  Please see the history of present illness.   Otherwise, review of systems are positive for deafness.   All other systems are reviewed and negative.    PHYSICAL EXAM: VS:  BP (!) 144/74   Pulse 78   Ht 5\' 1"  (1.549 m)   Wt 209 lb 6.4 oz (95 kg)   BMI 39.57 kg/m  , BMI Body mass index is 39.57 kg/m.  GENERAL:  Well appearing NECK:  No jugular venous distention, waveform within normal limits, carotid upstroke brisk and symmetric, no bruits, no thyromegaly LUNGS:  Clear to auscultation bilaterally CHEST:  Unremarkable HEART:  PMI not displaced or sustained,S1 and S2 within normal limits, no S3, no S4, no clicks, no rubs, no murmurs ABD:  Flat, positive bowel sounds normal in frequency in pitch, no bruits, no rebound, no guarding, no midline pulsatile mass, no hepatomegaly, no splenomegaly EXT:  2 plus pulses throughout, no edema, no cyanosis no clubbing  SATURATION QUALIFICATIONS: (This note is used to comply with regulatory documentation for home oxygen)  Patient Saturations on Room Air at Rest = 91%  Patient Saturations on Room Air while Ambulating = 87%  Patient Saturations on 2 Liters of oxygen while Ambulating = 93%  EKG:  EKG is ordered today. NSR, possible old inferior infarct, old anterior infarct.  No acute ST T wave changes.  Call Ms. Dolloff with the results and send results to [name], DO    Recent Labs: 06/22/2017: BUN 18; Creatinine, Ser 0.60; Hemoglobin 13.0; Platelets 279.0; Potassium 3.7; Sodium 140    Lipid Panel    Component Value Date/Time   CHOL 141 09/22/2016 0951   TRIG 67.0 09/22/2016 0951   HDL 39.10 09/22/2016 0951   CHOLHDL 4 09/22/2016 0951   VLDL 13.4 09/22/2016 0951   LDLCALC 88 09/22/2016 0951   LDLDIRECT 111.6 02/14/2010 1406      Wt Readings from Last 3 Encounters:  08/26/17 209 lb 6.4 oz (95 kg)  06/22/17 206 lb 14.4 oz (93.8 kg)  05/31/17 195 lb (88.5 kg)      Other  studies Reviewed: Additional studies/ records that were reviewed today include:  N Review of the above records demonstrates:  Please see elsewhere in the note.     ASSESSMENT AND PLAN:  CHRONIC DIASTOLIC HF:   She seems to be euvolemic.  At this point I will not change her therapy.  DYSPNEA: We walked her around the office.  I  am sure there is no level of deconditioning.  However, she did drop her oxygen saturations from 91-87%.  I am going to try to prescribe as needed oxygen for nighttime and activity.  I do note that she was not hypercarbic and was not CO2 retaining when she was in the hospital at the last visit.   Of note, I will consider stress perfusion study in the future given the abnormal EKG.    DM:  I do note that her last hemoglobin A1c was 8.1.  This is higher than previous.   I will defer to [name], DO   HTN:   Blood pressure is acceptable limits.  No change in therapy.ily.  HYPOKALEMIA:    Her potassium was normal at the last reading 2 months ago.    Current medicines are reviewed at length with the patient today.  The patient does not have concerns regarding medicines.  The following changes have been made:  As above  Labs/ tests ordered today include:  None  No orders of the defined types were placed in this encounter.    Disposition:   FU with APP in six months.   Signed, Minus Breeding, MD  08/26/2017 4:45 PM    [facility]

## 2017-08-26 ENCOUNTER — Ambulatory Visit: Payer: Medicare HMO | Admitting: Cardiology

## 2017-08-26 VITALS — BP 144/74 | HR 78 | Ht 61.0 in | Wt 209.4 lb

## 2017-08-26 DIAGNOSIS — R0602 Shortness of breath: Secondary | ICD-10-CM

## 2017-08-26 DIAGNOSIS — E119 Type 2 diabetes mellitus without complications: Secondary | ICD-10-CM | POA: Diagnosis not present

## 2017-08-26 DIAGNOSIS — Z794 Long term (current) use of insulin: Secondary | ICD-10-CM | POA: Diagnosis not present

## 2017-08-26 NOTE — Patient Instructions (Signed)
Medication Instructions:  Continue current medications  If you need a refill on your cardiac medications before your next appointment, please call your pharmacy.  Labwork: None Ordered   Testing/Procedures: None ordered  Follow-Up: Your physician wants you to follow-up in: 6 Months with APP. You should receive a reminder letter in the mail two months in advance. If you do not receive a letter, please call our office [phone].    Thank you for choosing [facility] at [facility]!!

## 2017-08-27 ENCOUNTER — Encounter: Payer: Self-pay | Admitting: Cardiology

## 2017-08-27 DIAGNOSIS — S52551D Other extraarticular fracture of lower end of right radius, subsequent encounter for closed fracture with routine healing: Secondary | ICD-10-CM | POA: Diagnosis not present

## 2017-08-27 DIAGNOSIS — R0602 Shortness of breath: Secondary | ICD-10-CM | POA: Insufficient documentation

## 2017-08-27 NOTE — Telephone Encounter (Signed)
It looks like per recent cardiology notes she should be taking 5mg  norvasc. Please check with pharmacy and pt. Only refill 5mg  if needed ( I dont want her taking both! And she is easily confused.) Thanks.

## 2017-08-30 ENCOUNTER — Telehealth: Payer: Self-pay | Admitting: Cardiology

## 2017-08-30 DIAGNOSIS — R0602 Shortness of breath: Secondary | ICD-10-CM | POA: Diagnosis not present

## 2017-08-30 DIAGNOSIS — G4733 Obstructive sleep apnea (adult) (pediatric): Secondary | ICD-10-CM | POA: Diagnosis not present

## 2017-08-30 DIAGNOSIS — I5032 Chronic diastolic (congestive) heart failure: Secondary | ICD-10-CM | POA: Diagnosis not present

## 2017-08-30 NOTE — Telephone Encounter (Signed)
Routed to MD's primary Nya CMA  Per last note:  DYSPNEA: We walked her around the office.  I am sure there is no level of deconditioning.  However, she did drop her oxygen saturations from 91-87%.  I am going to try to prescribe as needed oxygen for nighttime and activity.  I do note that she was not hypercarbic and was not CO2 retaining when she was in the hospital at the last visit.   Of note, I will consider stress perfusion study in the future given the abnormal EKG.

## 2017-08-30 NOTE — Telephone Encounter (Signed)
Patient calling, states that she was anticipating a call from our office about getting oxygen.

## 2017-08-30 NOTE — Telephone Encounter (Signed)
l called [facility] and Tiffany stated they do not have this medication listed on the pts profile.  Unable to leave a message due to the pts voicemail being full.

## 2017-09-01 ENCOUNTER — Other Ambulatory Visit: Payer: Self-pay | Admitting: Family Medicine

## 2017-09-01 MED ORDER — AMLODIPINE BESYLATE 5 MG PO TABS: 5.0000 mg | ORAL_TABLET | Freq: Every day | ORAL | 0 refills | Status: DC

## 2017-09-01 NOTE — Telephone Encounter (Signed)
I was unable to reach the pt via cell number today and I called the pharmacy and spoke with [place].  She stated they do not have an Rx for a 5mg  tablet on the pts profile there, only a 10mg  Rx by Dr Maudie Mercury from 2017.  Rx was denied and Gabriel Cirri stated they will let the pt know if she comes in to the pharmacy.  Rx for 5mg  sent to the pts pharmacy.

## 2017-09-02 ENCOUNTER — Other Ambulatory Visit: Payer: Medicare HMO

## 2017-09-02 NOTE — Telephone Encounter (Signed)
[facility]

## 2017-09-03 DIAGNOSIS — G4733 Obstructive sleep apnea (adult) (pediatric): Secondary | ICD-10-CM | POA: Diagnosis not present

## 2017-09-03 DIAGNOSIS — I5032 Chronic diastolic (congestive) heart failure: Secondary | ICD-10-CM | POA: Diagnosis not present

## 2017-09-03 DIAGNOSIS — R0602 Shortness of breath: Secondary | ICD-10-CM | POA: Diagnosis not present

## 2017-09-06 ENCOUNTER — Telehealth: Payer: Self-pay | Admitting: Hematology and Oncology

## 2017-09-06 NOTE — Telephone Encounter (Signed)
Called pt re appt being moved to 5/30 due to [facility] having 5/29 bmdc. Pt's vm was not set up - sending confirmation letter in the mail.

## 2017-09-07 ENCOUNTER — Ambulatory Visit
Admission: RE | Admit: 2017-09-07 | Discharge: 2017-09-07 | Disposition: A | Discharge status: Home / Self Care | Payer: Medicare HMO | Source: Ambulatory Visit | Attending: Family Medicine | Admitting: Family Medicine

## 2017-09-07 ENCOUNTER — Other Ambulatory Visit: Payer: Self-pay | Admitting: Family Medicine

## 2017-09-07 DIAGNOSIS — N6452 Nipple discharge: Secondary | ICD-10-CM

## 2017-09-07 DIAGNOSIS — R928 Other abnormal and inconclusive findings on diagnostic imaging of breast: Secondary | ICD-10-CM | POA: Diagnosis not present

## 2017-09-07 DIAGNOSIS — N6320 Unspecified lump in the left breast, unspecified quadrant: Secondary | ICD-10-CM

## 2017-09-07 DIAGNOSIS — N6489 Other specified disorders of breast: Secondary | ICD-10-CM | POA: Diagnosis not present

## 2017-09-07 HISTORY — DX: Personal history of irradiation: Z92.3

## 2017-09-08 ENCOUNTER — Ambulatory Visit
Admission: RE | Admit: 2017-09-08 | Discharge: 2017-09-08 | Disposition: A | Discharge status: Home / Self Care | Payer: Medicare HMO | Source: Ambulatory Visit | Attending: Family Medicine | Admitting: Family Medicine

## 2017-09-08 ENCOUNTER — Other Ambulatory Visit: Payer: Self-pay | Admitting: Family Medicine

## 2017-09-08 DIAGNOSIS — N6310 Unspecified lump in the right breast, unspecified quadrant: Secondary | ICD-10-CM

## 2017-09-08 DIAGNOSIS — N6312 Unspecified lump in the right breast, upper inner quadrant: Secondary | ICD-10-CM | POA: Diagnosis not present

## 2017-09-08 DIAGNOSIS — N6041 Mammary duct ectasia of right breast: Secondary | ICD-10-CM | POA: Diagnosis not present

## 2017-09-08 DIAGNOSIS — N6320 Unspecified lump in the left breast, unspecified quadrant: Secondary | ICD-10-CM

## 2017-09-13 ENCOUNTER — Other Ambulatory Visit: Payer: Self-pay | Admitting: Family Medicine

## 2017-09-13 MED FILL — LANTUS SOLOSTAR 100 UNITS/M: 100 | 47 days supply | Qty: 9 | Fill #0

## 2017-09-20 ENCOUNTER — Other Ambulatory Visit: Payer: Self-pay | Admitting: Cardiology

## 2017-09-20 NOTE — Telephone Encounter (Signed)
REFILL 

## 2017-09-24 DIAGNOSIS — N6452 Nipple discharge: Secondary | ICD-10-CM | POA: Diagnosis not present

## 2017-09-25 ENCOUNTER — Other Ambulatory Visit: Payer: Self-pay | Admitting: Family Medicine

## 2017-09-27 ENCOUNTER — Other Ambulatory Visit: Payer: Self-pay | Admitting: General Surgery

## 2017-09-27 DIAGNOSIS — N6452 Nipple discharge: Secondary | ICD-10-CM

## 2017-09-29 DIAGNOSIS — I5032 Chronic diastolic (congestive) heart failure: Secondary | ICD-10-CM | POA: Diagnosis not present

## 2017-09-29 DIAGNOSIS — G4733 Obstructive sleep apnea (adult) (pediatric): Secondary | ICD-10-CM | POA: Diagnosis not present

## 2017-09-29 DIAGNOSIS — R0602 Shortness of breath: Secondary | ICD-10-CM | POA: Diagnosis not present

## 2017-10-03 ENCOUNTER — Ambulatory Visit
Admission: RE | Admit: 2017-10-03 | Discharge: 2017-10-03 | Disposition: A | Discharge status: Home / Self Care | Payer: Medicare HMO | Source: Ambulatory Visit | Attending: General Surgery | Admitting: General Surgery

## 2017-10-03 DIAGNOSIS — G4733 Obstructive sleep apnea (adult) (pediatric): Secondary | ICD-10-CM | POA: Diagnosis not present

## 2017-10-03 DIAGNOSIS — N6452 Nipple discharge: Secondary | ICD-10-CM

## 2017-10-03 DIAGNOSIS — R0602 Shortness of breath: Secondary | ICD-10-CM | POA: Diagnosis not present

## 2017-10-03 DIAGNOSIS — I5032 Chronic diastolic (congestive) heart failure: Secondary | ICD-10-CM | POA: Diagnosis not present

## 2017-10-07 ENCOUNTER — Other Ambulatory Visit: Payer: Self-pay | Admitting: Family Medicine

## 2017-10-13 ENCOUNTER — Ambulatory Visit: Payer: Medicare HMO | Admitting: Hematology and Oncology

## 2017-10-14 ENCOUNTER — Ambulatory Visit: Payer: Medicare HMO | Admitting: Family Medicine

## 2017-10-14 ENCOUNTER — Inpatient Hospital Stay: Payer: Medicare HMO | Attending: Hematology and Oncology | Admitting: Hematology and Oncology

## 2017-10-14 NOTE — Progress Notes (Deleted)
Patient Care Team: [name], DO as PCP - General (Family Medicine) Suella Broad, MD as Consulting Physician (Physical Medicine and Rehabilitation) Minus Breeding, MD as Consulting Physician (Cardiology)  DIAGNOSIS:  Encounter Diagnosis  Name Primary?  . Malignant neoplasm of upper-outer quadrant of right breast in female, estrogen receptor positive ([place])     SUMMARY OF ONCOLOGIC HISTORY:   Breast cancer of upper-outer quadrant of right female breast ([place])   03/12/2010 Initial Diagnosis    Invasive lobular cancer, ER 100%, PR 88% Ki-67 13%, HER-2 negative ratio 1.3      04/23/2010 Surgery    Right breast lumpectomy invasive ductal carcinoma grade 1, 1 cm size; one sentinel lymph node negative for malignancy      04/28/2010 - 05/05/2010 Radiation Therapy    Accelerated partial breast radiation with MammoSite      05/27/2010 -  Anti-estrogen oral therapy    Arimidex 1 mg daily       CHIEF COMPLIANT: Follow-up on anastrozole therapy  INTERVAL HISTORY: JANUARY BERGTHOLD is a associated with above-mentioned history of right breast cancer treated with lumpectomy radiation and completed 7 and half years of antiestrogen therapy with anastrozole.  She denies any lumps or nodules in the breast.  REVIEW OF SYSTEMS:   Constitutional: Denies fevers, chills or abnormal weight loss Eyes: Denies blurriness of vision Ears, nose, mouth, throat, and face: Denies mucositis or sore throat Respiratory: Denies cough, dyspnea or wheezes Cardiovascular: Denies palpitation, chest discomfort Gastrointestinal:  Denies nausea, heartburn or change in bowel habits Skin: Denies abnormal skin rashes Lymphatics: Denies new lymphadenopathy or easy bruising Neurological:Denies numbness, tingling or new weaknesses Behavioral/Psych: Mood is stable, no new changes  Extremities: No lower extremity edema Breast:  denies any pain or lumps or nodules in either breasts All other systems were reviewed with  the patient and are negative.  I have reviewed the past medical history, past surgical history, social history and family history with the patient and they are unchanged from previous note.  ALLERGIES:  is allergic to sulfa antibiotics; sulfonamide derivatives; and other.  MEDICATIONS:  Current Outpatient Medications  Medication Sig Dispense Refill  . ACCU-CHEK FASTCLIX LANCETS MISC Use as directed to check blood sugar 100 each 3  . ACCU-CHEK GUIDE test strip USE TO CHECK GLUCOSE THREE TIMES DAILY 100 each 12  . amLODipine (NORVASC) 5 MG tablet Take 1 tablet (5 mg total) by mouth daily. 30 tablet 0  . amLODipine (NORVASC) 5 MG tablet Take 1 tablet (5 mg total) by mouth daily. 90 tablet 0  . anastrozole (ARIMIDEX) 1 MG tablet TAKE ONE TABLET BY MOUTH ONCE DAILY 30 tablet 11  . carvedilol (COREG) 3.125 MG tablet TAKE 1 TABLET BY MOUTH TWICE DAILY WITH MEALS 60 tablet 0  . cholecalciferol (VITAMIN D) 1000 units tablet Take 1,000 Units by mouth 2 (two) times daily.    . furosemide (LASIX) 40 MG tablet TAKE 1 TABLET BY MOUTH ONCE DAILY 90 tablet 0  . glucose blood (ACCU-CHEK AVIVA PLUS) test strip Use as instructed three times a day 100 each 5  . Insulin Pen Needle (BD PEN NEEDLE NANO U/F) 32G X 4 MM MISC USE AS DIRECTED ONCE DAILY 100 each 3  . KLOR-CON M10 10 MEQ tablet TAKE ONE TABLET BY MOUTH ONCE DAILY 30 tablet 11  . Lancets (ONETOUCH ULTRASOFT) lancets Use as instructed to check blood sugar 100 each 11  . LANTUS SOLOSTAR 100 UNIT/ML Solostar Pen INJECT 19 UNITS INTO THE SKIN  DAILY AT 10 PM. 9 mL 0  . lisinopril (PRINIVIL,ZESTRIL) 20 MG tablet TAKE 1 TABLET BY MOUTH ONCE DAILY 90 tablet 1  . metFORMIN (GLUCOPHAGE) 1000 MG tablet TAKE ONE TABLET BY MOUTH TWICE DAILY 180 tablet 1  . methocarbamol (ROBAXIN) 500 MG tablet Take 500 mg by mouth every 6 (six) hours as needed for muscle spasms. Reported on 09/25/2015    . pramipexole (MIRAPEX) 1 MG tablet TAKE 1 TABLET BY MOUTH TWICE DAILY 180  tablet 1  . RELION PEN NEEDLES 32G X 4 MM MISC USE AS DIRECTED ONCE DAILY 100 each 3   No current facility-administered medications for this visit.     PHYSICAL EXAMINATION: ECOG PERFORMANCE STATUS: 1 - Symptomatic but completely ambulatory  There were no vitals filed for this visit. There were no vitals filed for this visit.  GENERAL:alert, no distress and comfortable SKIN: skin color, texture, turgor are normal, no rashes or significant lesions EYES: normal, Conjunctiva are pink and non-injected, sclera clear OROPHARYNX:no exudate, no erythema and lips, buccal mucosa, and tongue normal  NECK: supple, thyroid normal size, non-tender, without nodularity LYMPH:  no palpable lymphadenopathy in the cervical, axillary or inguinal LUNGS: clear to auscultation and percussion with normal breathing effort HEART: regular rate & rhythm and no murmurs and no lower extremity edema ABDOMEN:abdomen soft, non-tender and normal bowel sounds MUSCULOSKELETAL:no cyanosis of digits and no clubbing  NEURO: alert & oriented x 3 with fluent speech, no focal motor/sensory deficits EXTREMITIES: No lower extremity edema BREAST: No palpable masses or nodules in either right or left breasts. No palpable axillary supraclavicular or infraclavicular adenopathy no breast tenderness or nipple discharge. (exam performed in the presence of a chaperone)  LABORATORY DATA:  I have reviewed the data as listed CMP Latest Ref Rng & Units 06/22/2017 03/23/2017 09/22/2016  Glucose 70 - 99 mg/dL 208(H) 186(H) 124(H)  BUN 6 - 23 mg/dL '18 14 14  ' Creatinine 0.40 - 1.20 mg/dL 0.60 0.63 0.57  Sodium 135 - 145 mEq/L 140 140 140  Potassium 3.5 - 5.1 mEq/L 3.7 3.2(L) 3.7  Chloride 96 - 112 mEq/L 100 99 101  CO2 19 - 32 mEq/L 34(H) 35(H) 33(H)  Calcium 8.4 - 10.5 mg/dL 9.0 9.2 9.2  Total Protein 6.5 - 8.1 g/dL - - -  Total Bilirubin 0.3 - 1.2 mg/dL - - -  Alkaline Phos 38 - 126 U/L - - -  AST 15 - 41 U/L - - -  ALT 14 - 54 U/L - - -     Lab Results  Component Value Date   WBC 9.0 06/22/2017   HGB 13.0 06/22/2017   HCT 39.3 06/22/2017   MCV 93.6 06/22/2017   PLT 279.0 06/22/2017   NEUTROABS 9.9 (H) 07/31/2016    ASSESSMENT & PLAN:  Breast cancer of upper-outer quadrant of right female breast ([place]) 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 She has been on antiestrogen therapy with Arimidex 1 mg daily since January 2012 Currently on extended adjuvant therapy  I discussed with the patient that she can now discontinue antiestrogen therapy having completed 7 years.  Arimidex toxicities: 1. Bone density 04/08/2015: T score -1.4 mild osteopenia. 2. Denies any hot flashes or myalgias.  Breast cancer surveillance: 1. Breast exam 10/14/2017 is normal 2. Mammogram  09/07/2017: Indeterminate mass right breast 12 o'clock position: Biopsy did not show any malignancy  Patient spends summers in [place] and  the rest of the year she spends in [place].  Return to clinic in 1 year for follow-up with long-term survivorship clinic      No orders of the defined types were placed in this encounter.  The patient has a good understanding of the overall plan. she agrees with it. she will call with any problems that may develop before the next visit here.   Harriette Ohara, MD 10/14/17

## 2017-10-14 NOTE — Assessment & Plan Note (Deleted)
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 She has been on antiestrogen therapy with Arimidex 1 mg daily since January 2012 Currently on extended adjuvant therapy  I discussed with the patient that she can now discontinue antiestrogen therapy having completed 7 years.  Arimidex toxicities: 1. Bone density 04/08/2015: T score -1.4 mild osteopenia. 2. Denies any hot flashes or myalgias.  Breast cancer surveillance: 1. Breast exam 10/14/2017 is normal 2. Mammogram  09/07/2017: Indeterminate mass right breast 12 o'clock position: Biopsy did not show any malignancy  Patient spends summers in [place] and the rest of the year she spends in [place].  Return to clinic in 1 year for follow-up with long-term survivorship clinic

## 2017-10-17 NOTE — Progress Notes (Signed)
HPI:  Using dictation device. Unfortunately this device frequently misinterprets words/phrases.  [name] is a pleasant 77 y.o. here for follow up. Chronic medical problems summarized below were reviewed for changes. Unfortunately, it has been challenging to assist her in improvement of her health for a number of reasons, poor compliance, does not follow treatment recommendations, does not answer or return phone calls, does not wear hearing aides to appointments. Diabetes uncontrolled because does not take medications daily and dietary indiscretion I suggested that they see a geriatricain who may be better able to assist them with all of these issues and offer longer appointment times. They refused. Today admits missing a number of doses of metformin including skipping the evening dose. Doe snot like the taste of it - but otherwise tolerates ok. Thinks she takes the insulin daily - 19 units.   Diabetes with polyneuropathy/CKD, HTN, HLD, Obesity: -meds: insulin, metformin, acei -hx poor compliance -sees podiatrist -not on asa - hx frequent falls -not on statin, declined  Morbid obesity: -poor diet, little activity  RLS: -on mirapex prior to establishing with me  Hx Sleep apnea, dCHF, HTN, HLD: -sees pulmonologist for sleep apnea but resists treatment recommendations -sees Dr. Percival Spanish, cardiology -recenlty started on as needed O2 by cardiology for low ambulatory O2 -meds: amlodipine, coreg, lasix, lisinopril -refuses statin  Hx Depression and chronic pain: -Osteoarthritis and DDD -sees [place] [facility] -cymbalta helps -I have advised against unless very cautious supervision and do not rx opiods given her co-morbidities, age and frequent falls along with chronic rather then acute pain  Poor memory/gait abnormality: -referred to neurology - but they did not go  Hx of Breast Ca: -sees oncologist for surveillance, follow up -on arimidex  Chronic back pain, OA, chronic pain, hx  fxr radius: -sees Dr, Nelva Bush, Dr. Caralyn Guile   ROS: See pertinent positives and negatives per HPI.  Past Medical History:  Diagnosis Date  . AK (actinic keratosis)   . Anxiety    occasional  . Breast cancer ([place])    R breast, s/p radiation 34Gy/94fx 04/29/10-05/05/10  . CTS (carpal tunnel syndrome)    Left  . Diabetes ([place])   . Diastolic CHF ([place])    Dx w/ hospitalization 06/2015 for respiratory failure  . Facial asymmetry, acquired 08/20/2016   From injury  . Falls frequently    "can not pick left leg up"  . Foot fracture   . Hearing loss    bilateral hearing aides  . History of environmental allergies   . History of kidney stones 20 years ago  . Hypertension   . MDD (major depressive disorder)   . No blood products ([place])  09/11/2013  . Osteoarthritis    hx shoulder, knee, back, wrist pain; saw Dr. Wynelle Link   . Personal history of radiation therapy   . RESTLESS LEGS SYNDROME 05/05/2007   Qualifier: Diagnosis of  By: Gwenette Greet MD, Armando Reichert   . RLS (restless legs syndrome)   . Sleep apnea     Past Surgical History:  Procedure Laterality Date  . APPENDECTOMY  age 55  . BREAST LUMPECTOMY Right 2012  . BREAST SURGERY  2012   rt breast lumpectomy  . flex signoidoscopy    . INCISION AND DRAINAGE PERIRECTAL ABSCESS    . JOINT REPLACEMENT Right 2006   knee  . TOTAL KNEE ARTHROPLASTY Left 08/06/2014   Procedure: LEFT TOTAL KNEE ARTHROPLASTY;  Surgeon: Gaynelle Arabian, MD;  Location: [facility];  Service: Orthopedics;  Laterality: Left;  .  TUBAL LIGATION  1979    Family History  Problem Relation Age of Onset  . Alcohol abuse Father   . Cancer Sister        breast  . Heart disease Paternal Grandfather   . Diabetes Other   . Obesity Other   . Heart disease Brother        Valve    SOCIAL HX: see hpi   Current Outpatient Medications:  .  ACCU-CHEK FASTCLIX LANCETS MISC, Use as directed to check blood sugar, Disp: 100 each, Rfl: 3 .  ACCU-CHEK GUIDE test strip, USE TO  CHECK GLUCOSE THREE TIMES DAILY, Disp: 100 each, Rfl: 12 .  amLODipine (NORVASC) 5 MG tablet, Take 1 tablet (5 mg total) by mouth daily., Disp: 30 tablet, Rfl: 0 .  amLODipine (NORVASC) 5 MG tablet, Take 1 tablet (5 mg total) by mouth daily., Disp: 90 tablet, Rfl: 0 .  anastrozole (ARIMIDEX) 1 MG tablet, TAKE ONE TABLET BY MOUTH ONCE DAILY, Disp: 30 tablet, Rfl: 11 .  carvedilol (COREG) 3.125 MG tablet, TAKE 1 TABLET BY MOUTH TWICE DAILY WITH MEALS, Disp: 60 tablet, Rfl: 0 .  cholecalciferol (VITAMIN D) 1000 units tablet, Take 1,000 Units by mouth 2 (two) times daily., Disp: , Rfl:  .  furosemide (LASIX) 40 MG tablet, TAKE 1 TABLET BY MOUTH ONCE DAILY, Disp: 90 tablet, Rfl: 0 .  glucose blood (ACCU-CHEK AVIVA PLUS) test strip, Use as instructed three times a day, Disp: 100 each, Rfl: 5 .  Insulin Pen Needle (BD PEN NEEDLE NANO U/F) 32G X 4 MM MISC, USE AS DIRECTED ONCE DAILY, Disp: 100 each, Rfl: 3 .  KLOR-CON M10 10 MEQ tablet, TAKE ONE TABLET BY MOUTH ONCE DAILY, Disp: 30 tablet, Rfl: 11 .  Lancets (ONETOUCH ULTRASOFT) lancets, Use as instructed to check blood sugar, Disp: 100 each, Rfl: 11 .  LANTUS SOLOSTAR 100 UNIT/ML Solostar Pen, INJECT 19 UNITS INTO THE SKIN DAILY AT 10 PM., Disp: 9 mL, Rfl: 0 .  lisinopril (PRINIVIL,ZESTRIL) 20 MG tablet, TAKE 1 TABLET BY MOUTH ONCE DAILY, Disp: 90 tablet, Rfl: 1 .  metFORMIN (GLUCOPHAGE) 1000 MG tablet, TAKE ONE TABLET BY MOUTH TWICE DAILY, Disp: 180 tablet, Rfl: 1 .  methocarbamol (ROBAXIN) 500 MG tablet, Take 500 mg by mouth every 6 (six) hours as needed for muscle spasms. Reported on 09/25/2015, Disp: , Rfl:  .  pramipexole (MIRAPEX) 1 MG tablet, TAKE 1 TABLET BY MOUTH TWICE DAILY, Disp: 180 tablet, Rfl: 1 .  RELION PEN NEEDLES 32G X 4 MM MISC, USE AS DIRECTED ONCE DAILY, Disp: 100 each, Rfl: 3  EXAM:  Vitals:   10/18/17 0928  BP: 124/70  Pulse: 71  Temp: 98.3 F (36.8 C)    Body mass index is 39.36 kg/m.  GENERAL: vitals reviewed and  listed above, alert, oriented, appears well hydrated and in no acute distress  HEENT: atraumatic, conjunttiva clear, no obvious abnormalities on inspection of external nose and ears  NECK: no obvious masses on inspection  LUNGS: clear to auscultation bilaterally, no wheezes, rales or rhonchi, good air movement  CV: HRRR, no peripheral edema  MS: moves all extremities without noticeable abnormality  PSYCH: pleasant and cooperative, no obvious depression or anxiety  ASSESSMENT AND PLAN:  Discussed the following assessment and plan:  Type 2 diabetes mellitus with diabetic neuropathy, with long-term current use of insulin (HCC) -discussed options for management -increase inulin to 21 -try to increase metformin compliance though she does not like the taste, she  opted to try -lifestyle recs - see handout  Essential hypertension -stable  Class 2 severe obesity due to excess calories with serious comorbidity and body mass index (BMI) of 36.0 to 36.9 in adult [facility]) -lifestyle recs  Obstructive sleep apneas Chronic diastolic congestive heart failure ([place]) -sees specialist for management, notes reviewed  Mild episode of recurrent major depressive disorder ([place]) -see phq9, stable, adequately controlled  -Patient advised to return or notify a doctor immediately if symptoms worsen or persist or new concerns arise.  Patient Instructions  BEFORE YOU LEAVE: -inulin injection site sheet -follow up: 3 months  Increase Insulin to 21 units nightly  Try to take the metformin twice daily every day  Eat a healthy low sugar diet and try to move as much as possible      [name], DO

## 2017-10-18 ENCOUNTER — Encounter: Payer: Self-pay | Admitting: Family Medicine

## 2017-10-18 ENCOUNTER — Ambulatory Visit (INDEPENDENT_AMBULATORY_CARE_PROVIDER_SITE_OTHER): Payer: Medicare HMO | Admitting: Family Medicine

## 2017-10-18 VITALS — BP 124/70 | HR 71 | Temp 98.3°F | Ht 61.0 in | Wt 208.3 lb

## 2017-10-18 DIAGNOSIS — G4733 Obstructive sleep apnea (adult) (pediatric): Secondary | ICD-10-CM

## 2017-10-18 DIAGNOSIS — I10 Essential (primary) hypertension: Secondary | ICD-10-CM

## 2017-10-18 DIAGNOSIS — Z6836 Body mass index (BMI) 36.0-36.9, adult: Secondary | ICD-10-CM | POA: Diagnosis not present

## 2017-10-18 DIAGNOSIS — F330 Major depressive disorder, recurrent, mild: Secondary | ICD-10-CM

## 2017-10-18 DIAGNOSIS — I110 Hypertensive heart disease with heart failure: Secondary | ICD-10-CM | POA: Diagnosis not present

## 2017-10-18 DIAGNOSIS — E1140 Type 2 diabetes mellitus with diabetic neuropathy, unspecified: Secondary | ICD-10-CM

## 2017-10-18 DIAGNOSIS — Z794 Long term (current) use of insulin: Secondary | ICD-10-CM

## 2017-10-18 DIAGNOSIS — I5032 Chronic diastolic (congestive) heart failure: Secondary | ICD-10-CM | POA: Diagnosis not present

## 2017-10-18 DIAGNOSIS — C50411 Malignant neoplasm of upper-outer quadrant of right female breast: Secondary | ICD-10-CM | POA: Diagnosis not present

## 2017-10-18 NOTE — Patient Instructions (Signed)
BEFORE YOU LEAVE: -inulin injection site sheet -follow up: 3 months  Increase Insulin to 21 units nightly  Try to take the metformin twice daily every day  Eat a healthy low sugar diet and try to move as much as possible

## 2017-10-22 ENCOUNTER — Telehealth: Payer: Self-pay | Admitting: Family Medicine

## 2017-10-22 MED ORDER — INSULIN GLARGINE 100 UNIT/ML SOLOSTAR PEN: PEN_INJECTOR | SUBCUTANEOUS | 2 refills | Status: DC

## 2017-10-22 MED FILL — LANTUS SOLOSTAR 100 UNITS/M: 100 | 28 days supply | Qty: 6 | Fill #0

## 2017-10-22 NOTE — Telephone Encounter (Signed)
Rx done. 

## 2017-10-22 NOTE — Telephone Encounter (Addendum)
Patient needs her insulin called in to the pharmacy.  She was unsure the name of her insulin. Patient states she is pretty much out.    Pharmacy: [place] at [facility]

## 2017-10-29 ENCOUNTER — Other Ambulatory Visit: Payer: Self-pay | Admitting: Family Medicine

## 2017-10-30 DIAGNOSIS — R0602 Shortness of breath: Secondary | ICD-10-CM | POA: Diagnosis not present

## 2017-10-30 DIAGNOSIS — I5032 Chronic diastolic (congestive) heart failure: Secondary | ICD-10-CM | POA: Diagnosis not present

## 2017-10-30 DIAGNOSIS — G4733 Obstructive sleep apnea (adult) (pediatric): Secondary | ICD-10-CM | POA: Diagnosis not present

## 2017-11-03 DIAGNOSIS — I5032 Chronic diastolic (congestive) heart failure: Secondary | ICD-10-CM | POA: Diagnosis not present

## 2017-11-03 DIAGNOSIS — R0602 Shortness of breath: Secondary | ICD-10-CM | POA: Diagnosis not present

## 2017-11-03 DIAGNOSIS — G4733 Obstructive sleep apnea (adult) (pediatric): Secondary | ICD-10-CM | POA: Diagnosis not present

## 2017-11-08 DIAGNOSIS — H25813 Combined forms of age-related cataract, bilateral: Secondary | ICD-10-CM | POA: Diagnosis not present

## 2017-11-08 DIAGNOSIS — H5210 Myopia, unspecified eye: Secondary | ICD-10-CM | POA: Diagnosis not present

## 2017-11-08 LAB — HM DIABETES EYE EXAM

## 2017-11-15 MED FILL — LANTUS SOLOSTAR 100 UNITS/M: 100 | 28 days supply | Qty: 6 | Fill #1

## 2017-11-29 DIAGNOSIS — I5032 Chronic diastolic (congestive) heart failure: Secondary | ICD-10-CM | POA: Diagnosis not present

## 2017-11-29 DIAGNOSIS — G4733 Obstructive sleep apnea (adult) (pediatric): Secondary | ICD-10-CM | POA: Diagnosis not present

## 2017-11-29 DIAGNOSIS — R0602 Shortness of breath: Secondary | ICD-10-CM | POA: Diagnosis not present

## 2017-12-14 MED FILL — LANTUS SOLOSTAR 100 UNITS/M: 100 | 28 days supply | Qty: 6 | Fill #2

## 2017-12-15 ENCOUNTER — Other Ambulatory Visit: Payer: Self-pay | Admitting: Family Medicine

## 2017-12-30 DIAGNOSIS — I5032 Chronic diastolic (congestive) heart failure: Secondary | ICD-10-CM | POA: Diagnosis not present

## 2017-12-30 DIAGNOSIS — R0602 Shortness of breath: Secondary | ICD-10-CM | POA: Diagnosis not present

## 2017-12-30 DIAGNOSIS — G4733 Obstructive sleep apnea (adult) (pediatric): Secondary | ICD-10-CM | POA: Diagnosis not present

## 2018-01-04 ENCOUNTER — Other Ambulatory Visit: Payer: Self-pay | Admitting: Family Medicine

## 2018-01-10 MED FILL — LANTUS SOLOSTAR 100 UNITS/M: 100 | 28 days supply | Qty: 6 | Fill #3

## 2018-01-18 ENCOUNTER — Other Ambulatory Visit: Payer: Self-pay | Admitting: Hematology and Oncology

## 2018-01-19 NOTE — Progress Notes (Signed)
HPI:  Using dictation device. Unfortunately this device frequently misinterprets words/phrases.  [name] is a pleasant 77 y.o. here for follow up. Chronic medical problems summarized below were reviewed for changes. Longstanding uncontrolled diabetes secondary to poor compliance. At last visit advised upping insulin to 21 units and taking metformin consistently. Reports is taking the insulin, but still is not consistent with the metformin. She and her husband do not like that she is taking medications. She does not take her cymbalta any more and does not want to, no reported side effects. Does not want to take cholesterol medication. Eats poorly and does not really want to change this. Not inters ted in counseling. Declines  Denies low BS, CP, SOB, DOE, treatment intolerance or new symptoms. Due for flu vaccine, foot exam, eye exam, labs.  Diabetes with polyneuropathy/CKD, HTN, HLD, Obesity: -meds: insulin, metformin inconsistently, acei -hx poor compliance -sees podiatrist -not on asa - hx frequent falls -not on statin, declined  Morbid obesity: -poor diet, little activity  RLS: -on mirapex prior to establishing with me  Hx Sleep apnea, dCHF, HTN, HLD: -sees pulmonologist for sleep apnea but resists treatment recommendations -sees Dr. Percival Spanish, cardiology -started on as needed O2 by cardiology for low ambulatory O2 -meds: amlodipine, coreg, lasix, lisinopril -refuses statin  Hx Depression and chronic pain: -Osteoarthritis and DDD -sees [place] ortho -cymbalta helps - but no longer taking and doesn't want to, declines CBT or psych managment -I have advised against opiods given her co-morbidities, age and frequent falls along with chronic rather then acute pain  Poor memory/gait abnormality: -referred to neurology - but they did not go  Hx of Breast Ca: -sees oncologist for surveillance, follow up -on arimidex  Chronic back pain, OA, chronic pain, hx fxr  radius: -sees Dr, Nelva Bush, Dr. Caralyn Guile  ROS: See pertinent positives and negatives per HPI.  Past Medical History:  Diagnosis Date  . AK (actinic keratosis)   . Anxiety    occasional  . Breast cancer ([place])    R breast, s/p radiation 34Gy/30fx 04/29/10-05/05/10  . CTS (carpal tunnel syndrome)    Left  . Diabetes ([place])   . Diastolic CHF ([place])    Dx w/ hospitalization 06/2015 for respiratory failure  . Facial asymmetry, acquired 08/20/2016   From injury  . Falls frequently    "can not pick left leg up"  . Foot fracture   . Hearing loss    bilateral hearing aides  . History of environmental allergies   . History of kidney stones 20 years ago  . Hypertension   . MDD (major depressive disorder)   . No blood products ([place])  09/11/2013  . Osteoarthritis    hx shoulder, knee, back, wrist pain; saw Dr. Wynelle Link   . Personal history of radiation therapy   . RESTLESS LEGS SYNDROME 05/05/2007   Qualifier: Diagnosis of  By: Gwenette Greet MD, Armando Reichert   . RLS (restless legs syndrome)   . Sleep apnea     Past Surgical History:  Procedure Laterality Date  . APPENDECTOMY  age 21  . BREAST LUMPECTOMY Right 2012  . BREAST SURGERY  2012   rt breast lumpectomy  . flex signoidoscopy    . INCISION AND DRAINAGE PERIRECTAL ABSCESS    . JOINT REPLACEMENT Right 2006   knee  . TOTAL KNEE ARTHROPLASTY Left 08/06/2014   Procedure: LEFT TOTAL KNEE ARTHROPLASTY;  Surgeon: Gaynelle Arabian, MD;  Location: [facility];  Service: Orthopedics;  Laterality: Left;  . TUBAL  LIGATION  1979    Family History  Problem Relation Age of Onset  . Alcohol abuse Father   . Cancer Sister        breast  . Heart disease Paternal Grandfather   . Diabetes Other   . Obesity Other   . Heart disease Brother        Valve    SOCIAL HX: see hpi   Current Outpatient Medications:  .  ACCU-CHEK FASTCLIX LANCETS MISC, Use as directed to check blood sugar, Disp: 100 each, Rfl: 3 .  ACCU-CHEK GUIDE test strip, USE TO CHECK  GLUCOSE THREE TIMES DAILY, Disp: 100 each, Rfl: 12 .  amLODipine (NORVASC) 5 MG tablet, TAKE 1 TABLET BY MOUTH ONCE DAILY, Disp: 90 tablet, Rfl: 1 .  anastrozole (ARIMIDEX) 1 MG tablet, TAKE 1 TABLET BY MOUTH ONCE DAILY, Disp: 30 tablet, Rfl: 11 .  carvedilol (COREG) 3.125 MG tablet, TAKE 1 TABLET BY MOUTH TWICE DAILY WITH MEALS, Disp: 60 tablet, Rfl: 0 .  cholecalciferol (VITAMIN D) 1000 units tablet, Take 1,000 Units by mouth 2 (two) times daily., Disp: , Rfl:  .  furosemide (LASIX) 40 MG tablet, TAKE 1 TABLET BY MOUTH ONCE DAILY, Disp: 90 tablet, Rfl: 1 .  Insulin Glargine (LANTUS SOLOSTAR) 100 UNIT/ML Solostar Pen, INJECT 21 UNITS INTO THE SKIN DAILY AT 10 PM., Disp: 9 mL, Rfl: 2 .  Insulin Pen Needle (BD PEN NEEDLE NANO U/F) 32G X 4 MM MISC, USE AS DIRECTED ONCE DAILY, Disp: 100 each, Rfl: 3 .  KLOR-CON M10 10 MEQ tablet, TAKE ONE TABLET BY MOUTH ONCE DAILY, Disp: 30 tablet, Rfl: 11 .  lisinopril (PRINIVIL,ZESTRIL) 20 MG tablet, TAKE 1 TABLET BY MOUTH ONCE DAILY, Disp: 90 tablet, Rfl: 1 .  metFORMIN (GLUCOPHAGE) 1000 MG tablet, TAKE 1 TABLET BY MOUTH TWICE DAILY, Disp: 180 tablet, Rfl: 1 .  methocarbamol (ROBAXIN) 500 MG tablet, Take 500 mg by mouth every 6 (six) hours as needed for muscle spasms. Reported on 09/25/2015, Disp: , Rfl:  .  pramipexole (MIRAPEX) 1 MG tablet, TAKE 1 TABLET BY MOUTH TWICE DAILY, Disp: 180 tablet, Rfl: 1  EXAM:  Vitals:   01/20/18 0901  BP: 122/72  Pulse: 69  Temp: 98.4 F (36.9 C)    Body mass index is 40.11 kg/m.  GENERAL: vitals reviewed and listed above, alert, oriented, appears well hydrated and in no acute distress  HEENT: atraumatic, conjunttiva clear, no obvious abnormalities on inspection of external nose and ears  NECK: no obvious masses on inspection  LUNGS: clear to auscultation bilaterally, no wheezes, rales or rhonchi, good air movement  CV: HRRR, bilat tr foot/ankle edema MS: moves all extremities without noticeable  abnormality  PSYCH: pleasant and cooperative, no obvious depression or anxiety  ASSESSMENT AND PLAN:  Discussed the following assessment and plan:  Type 2 diabetes mellitus with diabetic neuropathy, with long-term current use of insulin (HCC) - Plan: Hemoglobin A1c  Hypertension associated with diabetes ([place]) - Plan: Basic metabolic panel, CBC  Hyperlipidemia associated with type 2 diabetes mellitus (HCC)  Mild episode of recurrent major depressive disorder (HCC)  Morbid obesity ([place])  Need for immunization against influenza - Plan: Flu vaccine HIGH DOSE PF (Fluzone High dose)  -sweet, but challenging to help, 77 yo with complicated PMH -had long discussion again about options for better health, - she declines most recommendations, and admits she really doesn't want to, nor does she care if health does not improve -she declined seeing behavior health for help  with mood, seeing geriatrician or other doctor -labs per orders -advised healthy diet, medication compliance, compression socks -advised eye exam -foot exam done -she wanted flu shot  -doesn't want to take cholesterol medication so have opted not to check cholesterol, will continue to encourage healthy diet -Patient advised to return or notify a doctor immediately if symptoms worsen or persist or new concerns arise.  Patient Instructions  BEFORE YOU LEAVE: -flu shot -labs -follow up: 3 months ([facility] with Manuela Schwartz and CPE Dr. Maudie Mercury if due)  We have ordered labs or studies at this visit. It can take up to 1-2 weeks for results and processing. IF results require follow up or explanation, we will call you with instructions. Clinically stable results will be released to your [facility]. If you have not heard from [place] or cannot find your results in [facility] in 2 weeks please contact our office at [phone].  If you are not yet signed up for [facility], please consider signing up.  Wear compression stockings.   We recommend  the following healthy lifestyle for LIFE: 1) Small portions. But, make sure to get regular (at least 3 per day), healthy meals and small healthy snacks if needed.  2) Eat a healthy clean diet.   TRY TO EAT: -at least 5-7 servings of low sugar, colorful, and nutrient rich vegetables per day (not corn, potatoes or bananas.) -berries are the best choice if you wish to eat fruit (only eat small amounts if trying to reduce weight)  -lean meets (fish, white meat of chicken or [place]) -vegan proteins for some meals - beans or tofu, whole grains, nuts and seeds -Replace bad fats with good fats - good fats include: fish, nuts and seeds, canola oil, olive oil -small amounts of low fat or non fat dairy -small amounts of100 % whole grains - check the lables -drink plenty of water  AVOID: -SUGAR, sweets, anything with added sugar, corn syrup or sweeteners - must read labels as even foods advertised as "healthy" often are loaded with sugar -if you must have a sweetener, small amounts of stevia may be best -sweetened beverages and artificially sweetened beverages -simple starches (rice, bread, potatoes, pasta, chips, etc - small amounts of 100% whole grains are ok) -red meat, pork, butter -fried foods, fast food, processed food, excessive dairy, eggs and coconut.  3)Get at least 150 minutes of sweaty aerobic exercise per week.  4)Reduce stress - consider counseling, meditation and relaxation to balance other aspects of your life.          [name], DO

## 2018-01-20 ENCOUNTER — Encounter: Payer: Self-pay | Admitting: Family Medicine

## 2018-01-20 ENCOUNTER — Ambulatory Visit (INDEPENDENT_AMBULATORY_CARE_PROVIDER_SITE_OTHER): Payer: Medicare HMO | Admitting: Family Medicine

## 2018-01-20 ENCOUNTER — Other Ambulatory Visit: Payer: Self-pay | Admitting: Cardiology

## 2018-01-20 VITALS — BP 122/72 | HR 69 | Temp 98.4°F | Ht 61.0 in | Wt 212.3 lb

## 2018-01-20 DIAGNOSIS — E1140 Type 2 diabetes mellitus with diabetic neuropathy, unspecified: Secondary | ICD-10-CM | POA: Diagnosis not present

## 2018-01-20 DIAGNOSIS — F330 Major depressive disorder, recurrent, mild: Secondary | ICD-10-CM

## 2018-01-20 DIAGNOSIS — Z794 Long term (current) use of insulin: Secondary | ICD-10-CM

## 2018-01-20 DIAGNOSIS — E785 Hyperlipidemia, unspecified: Secondary | ICD-10-CM

## 2018-01-20 DIAGNOSIS — Z23 Encounter for immunization: Secondary | ICD-10-CM

## 2018-01-20 DIAGNOSIS — I10 Essential (primary) hypertension: Secondary | ICD-10-CM | POA: Diagnosis not present

## 2018-01-20 DIAGNOSIS — E1169 Type 2 diabetes mellitus with other specified complication: Secondary | ICD-10-CM | POA: Diagnosis not present

## 2018-01-20 DIAGNOSIS — E1159 Type 2 diabetes mellitus with other circulatory complications: Secondary | ICD-10-CM | POA: Diagnosis not present

## 2018-01-20 DIAGNOSIS — I152 Hypertension secondary to endocrine disorders: Secondary | ICD-10-CM

## 2018-01-20 LAB — BASIC METABOLIC PANEL
BUN: 19 mg/dL (ref 6–23)
CALCIUM: 9.3 mg/dL (ref 8.4–10.5)
CO2: 36 meq/L — AB (ref 19–32)
CREATININE: 0.65 mg/dL (ref 0.40–1.20)
Chloride: 96 mEq/L (ref 96–112)
GFR: 93.49 mL/min (ref >60.00)
GLUCOSE: 142 mg/dL — AB (ref 70–99)
Potassium: 4.3 mEq/L (ref 3.5–5.1)
Sodium: 139 mEq/L (ref 135–145)

## 2018-01-20 LAB — CBC
HCT: 40.7 % (ref 36.0–46.0)
Hemoglobin: 13.6 g/dL (ref 12.0–15.0)
MCHC: 33.3 g/dL (ref 30.0–36.0)
MCV: 93 fl (ref 78.0–100.0)
Platelets: 282 10*3/uL (ref 150.0–400.0)
RBC: 4.38 Mil/uL (ref 3.87–5.11)
RDW: 13.9 % (ref 11.5–15.5)
WBC: 10.5 10*3/uL (ref 4.0–10.5)

## 2018-01-20 LAB — HEMOGLOBIN A1C: HEMOGLOBIN A1C: 8.3 % — AB (ref 4.6–6.5)

## 2018-01-20 NOTE — Patient Instructions (Addendum)
BEFORE YOU LEAVE: -flu shot -labs -follow up: 3 months ([facility]ic[facility] with Manuela Schwartz and CPE Dr. Maudie Mercury if due)  We have ordered labs or studies at this visit. It can take up to 1-2 weeks for results and processing. IF results require follow up or explanation, we will call you with instructions. Clinically stable results will be released to your [facility]. If you have not heard from [place] or cannot find your results in [facility] in 2 weeks please contact our office at [phone].  If you are not yet signed up for [facility], please consider signing up.  Wear compression stockings.   We recommend the following healthy lifestyle for LIFE: 1) Small portions. But, make sure to get regular (at least 3 per day), healthy meals and small healthy snacks if needed.  2) Eat a healthy clean diet.   TRY TO EAT: -at least 5-7 servings of low sugar, colorful, and nutrient rich vegetables per day (not corn, potatoes or bananas.) -berries are the best choice if you wish to eat fruit (only eat small amounts if trying to reduce weight)  -lean meets (fish, white meat of chicken or [place]) -vegan proteins for some meals - beans or tofu, whole grains, nuts and seeds -Replace bad fats with good fats - good fats include: fish, nuts and seeds, canola oil, olive oil -small amounts of low fat or non fat dairy -small amounts of100 % whole grains - check the lables -drink plenty of water  AVOID: -SUGAR, sweets, anything with added sugar, corn syrup or sweeteners - must read labels as even foods advertised as "healthy" often are loaded with sugar -if you must have a sweetener, small amounts of stevia may be best -sweetened beverages and artificially sweetened beverages -simple starches (rice, bread, potatoes, pasta, chips, etc - small amounts of 100% whole grains are ok) -red meat, pork, butter -fried foods, fast food, processed food, excessive dairy, eggs and coconut.  3)Get at least 150 minutes of sweaty aerobic exercise  per week.  4)Reduce stress - consider counseling, meditation and relaxation to balance other aspects of your life.

## 2018-01-21 ENCOUNTER — Other Ambulatory Visit: Payer: Self-pay | Admitting: Family Medicine

## 2018-01-30 DIAGNOSIS — R0602 Shortness of breath: Secondary | ICD-10-CM | POA: Diagnosis not present

## 2018-01-30 DIAGNOSIS — I5032 Chronic diastolic (congestive) heart failure: Secondary | ICD-10-CM | POA: Diagnosis not present

## 2018-01-30 DIAGNOSIS — G4733 Obstructive sleep apnea (adult) (pediatric): Secondary | ICD-10-CM | POA: Diagnosis not present

## 2018-02-03 ENCOUNTER — Other Ambulatory Visit: Payer: Self-pay | Admitting: Family Medicine

## 2018-02-03 MED FILL — LANTUS SOLOSTAR 100 UNITS/M: 100 | 25 days supply | Qty: 6 | Fill #0

## 2018-02-28 ENCOUNTER — Other Ambulatory Visit: Payer: Self-pay | Admitting: Family Medicine

## 2018-02-28 MED FILL — LANTUS SOLOSTAR 100 UNITS/M: 100 | 12 days supply | Qty: 3 | Fill #1

## 2018-03-01 DIAGNOSIS — G4733 Obstructive sleep apnea (adult) (pediatric): Secondary | ICD-10-CM | POA: Diagnosis not present

## 2018-03-01 DIAGNOSIS — I5032 Chronic diastolic (congestive) heart failure: Secondary | ICD-10-CM | POA: Diagnosis not present

## 2018-03-01 DIAGNOSIS — R0602 Shortness of breath: Secondary | ICD-10-CM | POA: Diagnosis not present

## 2018-03-08 ENCOUNTER — Ambulatory Visit (INDEPENDENT_AMBULATORY_CARE_PROVIDER_SITE_OTHER): Payer: Medicare HMO | Admitting: Family Medicine

## 2018-03-08 ENCOUNTER — Encounter: Payer: Self-pay | Admitting: Family Medicine

## 2018-03-08 VITALS — BP 116/70 | HR 84 | Temp 98.3°F | Ht 61.0 in | Wt 218.5 lb

## 2018-03-08 DIAGNOSIS — Z794 Long term (current) use of insulin: Secondary | ICD-10-CM | POA: Diagnosis not present

## 2018-03-08 DIAGNOSIS — E1140 Type 2 diabetes mellitus with diabetic neuropathy, unspecified: Secondary | ICD-10-CM | POA: Diagnosis not present

## 2018-03-08 MED ORDER — INSULIN GLARGINE 100 UNIT/ML SOLOSTAR PEN: PEN_INJECTOR | SUBCUTANEOUS | 3 refills | Status: DC

## 2018-03-08 NOTE — Patient Instructions (Signed)
Increase insulin to 25 units nightly.   Take all medications daily.  Eat a healthy low sugar diet.  I sent referral to endocrine and had [name] call the pharmacy to make sure 3 months of insulin on file with refill.

## 2018-03-08 NOTE — Progress Notes (Signed)
HPI:  Using dictation device. Unfortunately this device frequently misinterprets words/phrases.   Acute visit for Diabetes: -still uncontrolled -she reports pharmacy only gave her 1 pen -hx poor compliance, reports trying to do better -no low blood sugars -she and her husband are worried about med and side effects, prefer simpler regimen -chronic fatigue, pain, malaise   ROS: See pertinent positives and negatives per HPI.  Past Medical History:  Diagnosis Date  . AK (actinic keratosis)   . Anxiety    occasional  . Breast cancer ([place])    R breast, s/p radiation 34Gy/70fx 04/29/10-05/05/10  . CTS (carpal tunnel syndrome)    Left  . Diabetes ([place])   . Diastolic CHF ([place])    Dx w/ hospitalization 06/2015 for respiratory failure  . Facial asymmetry, acquired 08/20/2016   From injury  . Falls frequently    "can not pick left leg up"  . Foot fracture   . Hearing loss    bilateral hearing aides  . History of environmental allergies   . History of kidney stones 20 years ago  . Hypertension   . MDD (major depressive disorder)   . No blood products ([place])  09/11/2013  . Osteoarthritis    hx shoulder, knee, back, wrist pain; saw Dr. Wynelle Link   . Personal history of radiation therapy   . RESTLESS LEGS SYNDROME 05/05/2007   Qualifier: Diagnosis of  By: Gwenette Greet MD, Armando Reichert   . RLS (restless legs syndrome)   . Sleep apnea     Past Surgical History:  Procedure Laterality Date  . APPENDECTOMY  age 58  . BREAST LUMPECTOMY Right 2012  . BREAST SURGERY  2012   rt breast lumpectomy  . flex signoidoscopy    . INCISION AND DRAINAGE PERIRECTAL ABSCESS    . JOINT REPLACEMENT Right 2006   knee  . TOTAL KNEE ARTHROPLASTY Left 08/06/2014   Procedure: LEFT TOTAL KNEE ARTHROPLASTY;  Surgeon: Gaynelle Arabian, MD;  Location: [facility];  Service: Orthopedics;  Laterality: Left;  . TUBAL LIGATION  1979    Family History  Problem Relation Age of Onset  . Alcohol abuse Father   .  Cancer Sister        breast  . Heart disease Paternal Grandfather   . Diabetes Other   . Obesity Other   . Heart disease Brother        Valve    SOCIAL HX: see hpi   Current Outpatient Medications:  .  ACCU-CHEK FASTCLIX LANCETS MISC, Use as directed to check blood sugar, Disp: 100 each, Rfl: 3 .  ACCU-CHEK GUIDE test strip, USE TO CHECK GLUCOSE THREE TIMES DAILY, Disp: 100 each, Rfl: 12 .  amLODipine (NORVASC) 5 MG tablet, TAKE 1 TABLET BY MOUTH ONCE DAILY, Disp: 90 tablet, Rfl: 1 .  anastrozole (ARIMIDEX) 1 MG tablet, TAKE 1 TABLET BY MOUTH ONCE DAILY, Disp: 30 tablet, Rfl: 11 .  carvedilol (COREG) 3.125 MG tablet, TAKE 1 TABLET BY MOUTH TWICE DAILY WITH MEALS, Disp: 60 tablet, Rfl: 0 .  cholecalciferol (VITAMIN D) 1000 units tablet, Take 1,000 Units by mouth 2 (two) times daily., Disp: , Rfl:  .  furosemide (LASIX) 40 MG tablet, TAKE 1 TABLET BY MOUTH ONCE DAILY, Disp: 90 tablet, Rfl: 1 .  Insulin Glargine (LANTUS SOLOSTAR) 100 UNIT/ML Solostar Pen, Inject 24 Units into the skin daily at 10 pm., Disp: 9 mL, Rfl: 0 .  Insulin Glargine (LANTUS SOLOSTAR) 100 UNIT/ML Solostar Pen, INJECT 25 UNITS INTO THE SKIN  DAILY AT 10 PM., Disp: 5 pen, Rfl: 3 .  Insulin Pen Needle (BD PEN NEEDLE NANO U/F) 32G X 4 MM MISC, USE AS DIRECTED ONCE DAILY, Disp: 100 each, Rfl: 3 .  KLOR-CON M10 10 MEQ tablet, TAKE ONE TABLET BY MOUTH ONCE DAILY, Disp: 30 tablet, Rfl: 11 .  lisinopril (PRINIVIL,ZESTRIL) 20 MG tablet, TAKE 1 TABLET BY MOUTH ONCE DAILY, Disp: 90 tablet, Rfl: 2 .  metFORMIN (GLUCOPHAGE) 1000 MG tablet, TAKE 1 TABLET BY MOUTH TWICE DAILY, Disp: 180 tablet, Rfl: 1 .  methocarbamol (ROBAXIN) 500 MG tablet, Take 500 mg by mouth every 6 (six) hours as needed for muscle spasms. Reported on 09/25/2015, Disp: , Rfl:  .  pramipexole (MIRAPEX) 1 MG tablet, TAKE 1 TABLET BY MOUTH TWICE DAILY, Disp: 180 tablet, Rfl: 1  EXAM:  Vitals:   03/08/18 1427  BP: 116/70  Pulse: 84  Temp: 98.3 F (36.8 C)     Body mass index is 41.29 kg/m.  GENERAL: vitals reviewed and listed above, alert, oriented, appears well hydrated and in no acute distress  HEENT: atraumatic, conjunttiva clear, no obvious abnormalities on inspection of external nose and ears  NECK: no obvious masses on inspection  LUNGS: clear to auscultation bilaterally, no wheezes, rales or rhonchi, good air movement  CV: HRRR, no peripheral edema  MS: walks with walker  PSYCH: pleasant and cooperative, no obvious depression or anxiety  ASSESSMENT AND PLAN:  Discussed the following assessment and plan:  Type 2 diabetes mellitus with diabetic neuropathy, with long-term current use of insulin ([place]) - Plan: Ambulatory referral to Endocrinology  More than 50% of over 25 minutes spent in total in caring for this patient was spent face-to-face with the patient, counseling and/or coordinating care.   -had assistant call pharmacy to ensure has plenty of insulin (3 months) with refill -had lengthy discussion with patient about implications diabetes and management, she opted to see Endo (Dr. Chalmers Cater, [place] or Tobe Sos) -increase basal insulin to 25 units, monitor BS, call with lows/highs, healthy low sugar diet, better medication compliance -follow up 3 months -Patient advised to return or notify a doctor immediately if symptoms worsen or persist or new concerns arise.  Patient Instructions  Increase insulin to 25 units nightly.   Take all medications daily.  Eat a healthy low sugar diet.  I sent referral to endocrine and had [name] call the pharmacy to make sure 3 months of insulin on file with refill.   [name], DO

## 2018-03-10 ENCOUNTER — Telehealth: Payer: Self-pay

## 2018-03-10 NOTE — Telephone Encounter (Signed)
Copied from [place] [id number]. Topic: Referral - Medical Records >> Mar 10, 2018  8:51 AM Scherrie Gerlach wrote: Reason for CRM: [place] with [facility] states they need office notes, demo, all the things for a referral to Dr Chalmers Cater sent to  Attn : [employer]  [phone] They did not receive anything.

## 2018-03-10 NOTE — Telephone Encounter (Signed)
I refaxed patient referral notes again to [phone]

## 2018-03-11 MED FILL — LANTUS SOLOSTAR 100 UNITS/M: 100 | 37 days supply | Qty: 9 | Fill #0

## 2018-03-16 ENCOUNTER — Other Ambulatory Visit: Payer: Self-pay | Admitting: Family Medicine

## 2018-04-01 DIAGNOSIS — I5032 Chronic diastolic (congestive) heart failure: Secondary | ICD-10-CM | POA: Diagnosis not present

## 2018-04-01 DIAGNOSIS — R0602 Shortness of breath: Secondary | ICD-10-CM | POA: Diagnosis not present

## 2018-04-01 DIAGNOSIS — G4733 Obstructive sleep apnea (adult) (pediatric): Secondary | ICD-10-CM | POA: Diagnosis not present

## 2018-04-13 ENCOUNTER — Other Ambulatory Visit: Payer: Self-pay | Admitting: Family Medicine

## 2018-04-13 MED FILL — LANTUS SOLOSTAR 100 UNITS/M: 100 | 36 days supply | Qty: 9 | Fill #0

## 2018-04-26 ENCOUNTER — Ambulatory Visit (INDEPENDENT_AMBULATORY_CARE_PROVIDER_SITE_OTHER): Payer: Medicare HMO | Admitting: Family Medicine

## 2018-04-26 ENCOUNTER — Ambulatory Visit: Payer: Medicare HMO

## 2018-04-26 ENCOUNTER — Encounter: Payer: Self-pay | Admitting: Family Medicine

## 2018-04-26 VITALS — BP 118/68 | HR 73 | Temp 98.3°F | Ht 61.0 in | Wt 212.3 lb

## 2018-04-26 DIAGNOSIS — Z0000 Encounter for general adult medical examination without abnormal findings: Secondary | ICD-10-CM | POA: Diagnosis not present

## 2018-04-26 DIAGNOSIS — E66812 Obesity, class 2: Secondary | ICD-10-CM

## 2018-04-26 DIAGNOSIS — Z794 Long term (current) use of insulin: Secondary | ICD-10-CM

## 2018-04-26 DIAGNOSIS — I5032 Chronic diastolic (congestive) heart failure: Secondary | ICD-10-CM

## 2018-04-26 DIAGNOSIS — Z6841 Body Mass Index (BMI) 40.0 and over, adult: Secondary | ICD-10-CM | POA: Diagnosis not present

## 2018-04-26 DIAGNOSIS — E785 Hyperlipidemia, unspecified: Secondary | ICD-10-CM

## 2018-04-26 DIAGNOSIS — Z6836 Body mass index (BMI) 36.0-36.9, adult: Secondary | ICD-10-CM

## 2018-04-26 DIAGNOSIS — F330 Major depressive disorder, recurrent, mild: Secondary | ICD-10-CM | POA: Diagnosis not present

## 2018-04-26 DIAGNOSIS — E1159 Type 2 diabetes mellitus with other circulatory complications: Secondary | ICD-10-CM | POA: Diagnosis not present

## 2018-04-26 DIAGNOSIS — I10 Essential (primary) hypertension: Secondary | ICD-10-CM

## 2018-04-26 DIAGNOSIS — I152 Hypertension secondary to endocrine disorders: Secondary | ICD-10-CM

## 2018-04-26 DIAGNOSIS — C50411 Malignant neoplasm of upper-outer quadrant of right female breast: Secondary | ICD-10-CM | POA: Diagnosis not present

## 2018-04-26 DIAGNOSIS — E1140 Type 2 diabetes mellitus with diabetic neuropathy, unspecified: Secondary | ICD-10-CM

## 2018-04-26 DIAGNOSIS — E1169 Type 2 diabetes mellitus with other specified complication: Secondary | ICD-10-CM | POA: Diagnosis not present

## 2018-04-26 DIAGNOSIS — I110 Hypertensive heart disease with heart failure: Secondary | ICD-10-CM | POA: Diagnosis not present

## 2018-04-26 NOTE — Progress Notes (Signed)
Medicare Annual Preventive Care Visit  (initial annual wellness or annual wellness exam)  Concerns and/or follow up today:  Past medical history significant for uncontrolled insulin-dependent diabetes, poor compliance, referred to endocrinology, congestive heart failure, essential hypertension, obstructive sleep apnea, osteoarthritis, history of depression, morbid obesity. [name] is a pleasant 77 y.o. here for follow up.changes and stability and were updated as needed below. Denies CP, SOB, DOE, treatment intolerance or new symptoms. Reports is scheduled to see the endocrinologist in January. Missed her first appointment because had difficulty finding their office. Reports blood sugars recently 80-90s fasting. No lows or highs reported. Due for statin? - refuses, fasting labs, mri breast center follow up (surg notes)  Diabetes with polyneuropathy/CKD, HTN, HLD, Obesity: -meds: insulin, metformin inconsistently, acei -hx poor compliance -sees podiatrist -not on asa - hx frequent falls -not on statin, declined -referred to endocrinologist  Morbid obesity: -poor diet, little activity  RLS: -on mirapex prior to establishing with me  Hx Sleep apnea, dCHF, HTN, HLD: -sees pulmonologist for sleep apneabut resists treatment recommendations -sees Dr. Percival Spanish, cardiology -started on as needed O2 by cardiology for low ambulatory O2 -meds: amlodipine, coreg, lasix, lisinopril -refuses statin  Hx Depression and chronic pain: -Osteoarthritis and DDD -sees [place] [facility] -cymbalta helps - but no longer taking and doesn't want to, declines CBT or psych managment -I have advised against opiods given her co-morbidities, age and frequent falls along with chronic rather then acute pain  Poor memory/gait abnormality: -referred and advised neurology evaluation - they did not go   Hx of Breast Ca: -sees oncologist for surveillance, follow up -on arimidex -sees breast surgeon - reports  was suppose to have MRI but was too uncomfortable to do this and agrees to follow up with her breast specialist  Chronic back pain, OA, chronic pain, hx fxr radius: -sees Dr, Nelva Bush, Dr. Caralyn Guile   See HM section in Epic for other details of completed HM. See scanned documentation under Media Tab for further documentation HPI, health risk assessment. See Media Tab and Care Teams sections in Epic for other providers.  ROS: negative for report of fevers, unintentional weight loss, vision changes, vision loss, hearing loss or change, chest pain, sob, hemoptysis, melena, hematochezia, hematuria, genital discharge or lesions, falls, bleeding or bruising, loc, thoughts of suicide or self harm, memory loss  1.) Patient-completed health risk assessment  - completed and reviewed, see scanned documentation  2.) Review of Medical History: -PMH, PSH, Family History and current specialty and care providers reviewed and updated and listed below  - see scanned in document in chart and below  Past Medical History:  Diagnosis Date  . AK (actinic keratosis)   . Anxiety    occasional  . Breast cancer ([place])    R breast, s/p radiation 34Gy/46fx 04/29/10-05/05/10  . CTS (carpal tunnel syndrome)    Left  . Diabetes ([place])   . Diastolic CHF ([place])    Dx w/ hospitalization 06/2015 for respiratory failure  . Facial asymmetry, acquired 08/20/2016   From injury  . Falls frequently    "can not pick left leg up"  . Foot fracture   . Hearing loss    bilateral hearing aides  . History of environmental allergies   . History of kidney stones 20 years ago  . Hypertension   . MDD (major depressive disorder)   . No blood products ([place])  09/11/2013  . Osteoarthritis    hx shoulder, knee, back, wrist pain; saw Dr. Wynelle Link   .  Personal history of radiation therapy   . RESTLESS LEGS SYNDROME 05/05/2007   Qualifier: Diagnosis of  By: Gwenette Greet MD, Armando Reichert   . RLS (restless legs syndrome)   . Sleep apnea      Past Surgical History:  Procedure Laterality Date  . APPENDECTOMY  age 41  . BREAST LUMPECTOMY Right 2012  . BREAST SURGERY  2012   rt breast lumpectomy  . flex signoidoscopy    . INCISION AND DRAINAGE PERIRECTAL ABSCESS    . JOINT REPLACEMENT Right 2006   knee  . TOTAL KNEE ARTHROPLASTY Left 08/06/2014   Procedure: LEFT TOTAL KNEE ARTHROPLASTY;  Surgeon: Gaynelle Arabian, MD;  Location: [facility];  Service: Orthopedics;  Laterality: Left;  . TUBAL LIGATION  1979    Social History   Socioeconomic History  . Marital status: Married    Spouse name: Not on file  . Number of children: 3  . Years of education: Not on file  . Highest education level: Not on file  Occupational History  . Not on file  Social Needs  . Financial resource strain: Not on file  . Food insecurity:    Worry: Not on file    Inability: Not on file  . Transportation needs:    Medical: Not on file    Non-medical: Not on file  Tobacco Use  . Smoking status: Never Smoker  . Smokeless tobacco: Never Used  Substance and Sexual Activity  . Alcohol use: No  . Drug use: No  . Sexual activity: Yes  Lifestyle  . Physical activity:    Days per week: Not on file    Minutes per session: Not on file  . Stress: Not on file  Relationships  . Social connections:    Talks on phone: Not on file    Gets together: Not on file    Attends religious service: Not on file    Active member of club or organization: Not on file    Attends meetings of clubs or organizations: Not on file    Relationship status: Not on file  . Intimate partner violence:    Fear of current or ex partner: Not on file    Emotionally abused: Not on file    Physically abused: Not on file    Forced sexual activity: Not on file  Other Topics Concern  . Not on file  Social History Narrative         Home Situation: lives with husband and granddaughter      Spiritual Beliefs: Jehovah Witness - no blood products                Family  History  Problem Relation Age of Onset  . Alcohol abuse Father   . Cancer Sister        breast  . Heart disease Paternal Grandfather   . Diabetes Other   . Obesity Other   . Heart disease Brother        Valve    Current Outpatient Medications on File Prior to Visit  Medication Sig Dispense Refill  . ACCU-CHEK FASTCLIX LANCETS MISC Use as directed to check blood sugar 100 each 3  . ACCU-CHEK GUIDE test strip USE TO CHECK GLUCOSE THREE TIMES DAILY 100 each 12  . amLODipine (NORVASC) 5 MG tablet TAKE 1 TABLET BY MOUTH ONCE DAILY 90 tablet 1  . anastrozole (ARIMIDEX) 1 MG tablet TAKE 1 TABLET BY MOUTH ONCE DAILY 30 tablet 11  . carvedilol (COREG) 3.125 MG  tablet TAKE 1 TABLET BY MOUTH TWICE DAILY WITH MEALS (NEEDS  APPOINTMENT) 60 tablet 5  . cholecalciferol (VITAMIN D) 1000 units tablet Take 1,000 Units by mouth 2 (two) times daily.    . furosemide (LASIX) 40 MG tablet TAKE 1 TABLET BY MOUTH ONCE DAILY 90 tablet 1  . Insulin Glargine (LANTUS SOLOSTAR) 100 UNIT/ML Solostar Pen INJECT 25 UNITS INTO THE SKIN DAILY AT 10 PM. 5 pen 3  . Insulin Pen Needle (BD PEN NEEDLE NANO U/F) 32G X 4 MM MISC USE AS DIRECTED ONCE DAILY 100 each 3  . KLOR-CON M10 10 MEQ tablet TAKE ONE TABLET BY MOUTH ONCE DAILY 30 tablet 11  . lisinopril (PRINIVIL,ZESTRIL) 20 MG tablet TAKE 1 TABLET BY MOUTH ONCE DAILY 90 tablet 2  . metFORMIN (GLUCOPHAGE) 1000 MG tablet TAKE 1 TABLET BY MOUTH TWICE DAILY 180 tablet 1  . methocarbamol (ROBAXIN) 500 MG tablet Take 500 mg by mouth every 6 (six) hours as needed for muscle spasms. Reported on 09/25/2015    . pramipexole (MIRAPEX) 1 MG tablet TAKE 1 TABLET BY MOUTH TWICE DAILY 180 tablet 1   No current facility-administered medications on file prior to visit.      3.) Review of functional ability and level of safety:  Any difficulty hearing?  See scanned documentation  History of falling?  See scanned documentation  Any trouble with IADLs - using a phone, using  transportation, grocery shopping, preparing meals, doing housework, doing laundry, taking medications and managing money?  See scanned documentation  Advance Directives?  see scanned documentation See summary of recommendations in Patient Instructions below.  4.) Physical Exam Vitals:   04/26/18 1322  BP: 118/68  Pulse: 73  Temp: 98.3 F (36.8 C)   Estimated body mass index is 40.11 kg/m as calculated from the following:   Height as of this encounter: 5\' 1"  (1.549 m).   Weight as of this encounter: 212 lb 4.8 oz (96.3 kg).  EKG (optional): deferred  General: alert, appear well hydrated and in no acute distress  HEENT: visual acuity grossly intact  CV: HRRR  Lungs: CTA bilaterally  Psych: pleasant and cooperative, no obvious depression or anxiety  Cognitive function grossly intact  See patient instructions for recommendations.  Education and counseling regarding the above review of health provided with a plan for the following: -see scanned patient completed form for further details -fall prevention strategies discussed  -healthy lifestyle discussed -importance and resources for completing advanced directives discussed -see patient instructions below for any other recommendations provided  4)The following written screening schedule of preventive measures were reviewed with assessment and plan made per below, orders and patient instructions:      AAA screening done if applicable     Alcohol screening done     Obesity Screening and counseling done     STI screening (Hep C if born [birth year]) offered and per pt wishes     Tobacco Screening done done       Pneumococcal (PPSV23 -one dose after 64, one before if risk factors), influenza yearly and hepatitis B vaccines (if high risk - end stage renal disease, IV drugs, homosexual men, live in home for mentally retarded, hemophilia receiving factors) ASSESSMENT/PLAN: done if applicable      Screening mammograph (yearly if  >40) ASSESSMENT/PLAN: done in April, hx nipple discharge and undergoing eval at breast center, s/p biopsy and MRI ordered and advised per review of breast center notes, also referred to [facility] - reports she  will call her breast surgeon as was unable to complete the MRI. No symptoms.      Screening Pap smear/pelvic exam (q2 years) ASSESSMENT/PLAN: n/a, declined      Colorectal cancer screening (FOBT yearly or flex sig q4y or colonoscopy q10y or barium enema q4y) ASSESSMENT/PLAN: n/a, declined      Diabetes outpatient self-management training services ASSESSMENT/PLAN: utd or done      Bone mass measurements(covered q2y if indicated - estrogen def, osteoporosis, hyperparathyroid, vertebral abnormalities, osteoporosis or steroids) ASSESSMENT/PLAN: utd or discussed and ordered per pt wishes      Screening for glaucoma(q1y if high risk - diabetes, FH, AA and > 50 or hispanic and > 65) ASSESSMENT/PLAN: utd or advised      Medical nutritional therapy for individuals with diabetes or renal disease ASSESSMENT/PLAN: see orders      Cardiovascular screening blood tests (lipids q5y) ASSESSMENT/PLAN: see orders and labs      Diabetes screening tests ASSESSMENT/PLAN: see orders and labs   7.) Summary:   Medicare annual wellness visit, subsequent -risk factors and conditions per above assessment were discussed and treatment, recommendations and referrals were offered per documentation above and orders and patient instructions.  Type 2 diabetes mellitus with diabetic neuropathy, with long-term current use of insulin ([place]) - Plan: Hemoglobin A1c -reports has appt with endo -reported bs ok -labs per orders, cont current meds for now  Hypertension associated with diabetes ([place]) - Plan: Basic metabolic panel, CBC Hyperlipidemia associated with type 2 diabetes mellitus ([place]) - Plan: Lipid panel -labs per orders, cont current meds -advised statin, she has refused multiple times  Mild  episode of recurrent major depressive disorder ([place]) -see phq9, chronic, declined further tx  Chronic diastolic congestive heart failure ([facility]) -stable  Class 2 severe obesity due to excess calories with serious comorbidity and body mass index (BMI) of 36.0 to 36.9 in adult ([place]) -lifestyle recs, see pt instructions  Malignant neoplasm of upper-outer quadrant of right female breast, unspecified estrogen receptor status ([place]) -sees specialist for management -offered to contact specialist for follow up - she agrees to call instead and advised assistant to make use has contact info for her specialist and to call her in 1 week to check she has follow up.   Patient Instructions   BEFORE YOU LEAVE: -AWV form, PHQ9 -number for her breast specialist to call about not doing MRI and follow up - [name], set reminder to call her in 1 week to ensure she has scheduled follow up about this -schedule lab visit -follow up:  1) fasting lab visit in the next 1 month 2) follow up with Dr. Maudie Mercury in 3-4 months   [name] , Thank you for taking time to come for your Medicare Wellness Visit. I appreciate your ongoing commitment to your health goals. Please review the following plan we discussed and let me know if I can assist you in the future.   These are the goals we discussed: Goals      General   . Healthy low sugar diet  Regular exercise as able  FOLLOW up up with breast specialist  See Endocrinologist for help with diabetes  Consider crestor low dose 2 days per week to help lower your chance of cardiovascular disease - refused in the past. Let us know if your change your mind.        This is a list of the screening recommended for you and due dates:  Health Maintenance  Topic Date Due  .  Hemoglobin A1C  07/21/2018  . Eye exam for diabetics  11/09/2018  . Complete foot exam   01/21/2019  . Tetanus Vaccine  10/20/2025  . Flu Shot  Completed  . DEXA scan (bone density measurement)   Completed  . Pneumonia vaccines  Completed      Vanessa Kern, DO

## 2018-04-26 NOTE — Patient Instructions (Addendum)
BEFORE YOU LEAVE: -AWV form, PHQ9 -number for her breast specialist to call about not doing MRI and follow up - [name], set reminder to call her in 1 week to ensure she has scheduled follow up about this -schedule lab visit -follow up:  1) fasting lab visit in the next 1 month 2) follow up with Dr. Maudie Mercury in 3-4 months   [name] , Thank you for taking time to come for your Medicare Wellness Visit. I appreciate your ongoing commitment to your health goals. Please review the following plan we discussed and let me know if I can assist you in the future.   These are the goals we discussed: Goals      General   . Healthy low sugar diet  Regular exercise as able  FOLLOW up up with breast specialist  See Endocrinologist for help with diabetes  Consider crestor low dose 2 days per week to help lower your chance of cardiovascular disease - refused in the past. Let us know if your change your mind.        This is a list of the screening recommended for you and due dates:  Health Maintenance  Topic Date Due  . Hemoglobin A1C  07/21/2018  . Eye exam for diabetics  11/09/2018  . Complete foot exam   01/21/2019  . Tetanus Vaccine  10/20/2025  . Flu Shot  Completed  . DEXA scan (bone density measurement)  Completed  . Pneumonia vaccines  Completed    We recommend the following healthy lifestyle for LIFE: 1) Small portions. But, make sure to get regular (at least 3 per day), healthy meals and small healthy snacks if needed.  2) Eat a healthy clean diet.   TRY TO EAT: -at least 5-7 servings of low sugar, colorful, and nutrient rich vegetables per day (not corn, potatoes or bananas.) -berries are the best choice if you wish to eat fruit (only eat small amounts if trying to reduce weight)  -lean meets (fish, white meat of chicken or [place]) -vegan proteins for some meals - beans or tofu, whole grains, nuts and seeds -Replace bad fats with good fats - good fats include: fish, nuts and  seeds, canola oil, olive oil -small amounts of low fat or non fat dairy -small amounts of100 % whole grains - check the lables -drink plenty of water  AVOID: -SUGAR, sweets, anything with added sugar, corn syrup or sweeteners - must read labels as even foods advertised as "healthy" often are loaded with sugar -if you must have a sweetener, small amounts of stevia may be best -sweetened beverages and artificially sweetened beverages -simple starches (rice, bread, potatoes, pasta, chips, etc - small amounts of 100% whole grains are ok) -red meat, pork, butter -fried foods, fast food, processed food, excessive dairy, eggs and coconut.  3)Get at least 150 minutes of sweaty aerobic exercise per week.  4)Reduce stress - consider counseling, meditation and relaxation to balance other aspects of your life.

## 2018-04-27 ENCOUNTER — Other Ambulatory Visit: Payer: Self-pay | Admitting: Cardiology

## 2018-05-01 DIAGNOSIS — I5032 Chronic diastolic (congestive) heart failure: Secondary | ICD-10-CM | POA: Diagnosis not present

## 2018-05-01 DIAGNOSIS — G4733 Obstructive sleep apnea (adult) (pediatric): Secondary | ICD-10-CM | POA: Diagnosis not present

## 2018-05-01 DIAGNOSIS — R0602 Shortness of breath: Secondary | ICD-10-CM | POA: Diagnosis not present

## 2018-05-16 MED FILL — LANTUS SOLOSTAR 100 UNITS/M: 100 | 36 days supply | Qty: 9 | Fill #1

## 2018-05-27 ENCOUNTER — Other Ambulatory Visit (INDEPENDENT_AMBULATORY_CARE_PROVIDER_SITE_OTHER): Payer: Medicare HMO

## 2018-05-27 DIAGNOSIS — I10 Essential (primary) hypertension: Secondary | ICD-10-CM

## 2018-05-27 DIAGNOSIS — Z794 Long term (current) use of insulin: Secondary | ICD-10-CM | POA: Diagnosis not present

## 2018-05-27 DIAGNOSIS — E1169 Type 2 diabetes mellitus with other specified complication: Secondary | ICD-10-CM

## 2018-05-27 DIAGNOSIS — E785 Hyperlipidemia, unspecified: Secondary | ICD-10-CM | POA: Diagnosis not present

## 2018-05-27 DIAGNOSIS — E1140 Type 2 diabetes mellitus with diabetic neuropathy, unspecified: Secondary | ICD-10-CM | POA: Diagnosis not present

## 2018-05-27 DIAGNOSIS — I152 Hypertension secondary to endocrine disorders: Secondary | ICD-10-CM

## 2018-05-27 DIAGNOSIS — E1159 Type 2 diabetes mellitus with other circulatory complications: Secondary | ICD-10-CM

## 2018-05-27 LAB — CBC
HCT: 42.6 % (ref 36.0–46.0)
Hemoglobin: 13.8 g/dL (ref 12.0–15.0)
MCHC: 32.5 g/dL (ref 30.0–36.0)
MCV: 92.9 fl (ref 78.0–100.0)
Platelets: 292 10*3/uL (ref 150.0–400.0)
RBC: 4.59 Mil/uL (ref 3.87–5.11)
RDW: 14 % (ref 11.5–15.5)
WBC: 11 10*3/uL — AB (ref 4.0–10.5)

## 2018-05-27 LAB — LIPID PANEL
CHOL/HDL RATIO: 4
Cholesterol: 130 mg/dL (ref 0–200)
HDL: 34.6 mg/dL — AB (ref >39.00)
LDL Cholesterol: 82 mg/dL (ref 0–99)
NONHDL: 94.96
Triglycerides: 66 mg/dL (ref 0.0–149.0)
VLDL: 13.2 mg/dL (ref 0.0–40.0)

## 2018-05-27 LAB — BASIC METABOLIC PANEL
BUN: 19 mg/dL (ref 6–23)
CHLORIDE: 97 meq/L (ref 96–112)
CO2: 35 meq/L — AB (ref 19–32)
Calcium: 9.2 mg/dL (ref 8.4–10.5)
Creatinine, Ser: 0.68 mg/dL (ref 0.40–1.20)
GFR: 88.67 mL/min (ref >60.00)
Glucose, Bld: 78 mg/dL (ref 70–99)
POTASSIUM: 3.6 meq/L (ref 3.5–5.1)
SODIUM: 141 meq/L (ref 135–145)

## 2018-05-27 LAB — HEMOGLOBIN A1C: Hgb A1c MFr Bld: 7.9 % — ABNORMAL HIGH (ref 4.6–6.5)

## 2018-06-01 DIAGNOSIS — I5032 Chronic diastolic (congestive) heart failure: Secondary | ICD-10-CM | POA: Diagnosis not present

## 2018-06-01 DIAGNOSIS — R0602 Shortness of breath: Secondary | ICD-10-CM | POA: Diagnosis not present

## 2018-06-01 DIAGNOSIS — G4733 Obstructive sleep apnea (adult) (pediatric): Secondary | ICD-10-CM | POA: Diagnosis not present

## 2018-06-07 DIAGNOSIS — I10 Essential (primary) hypertension: Secondary | ICD-10-CM | POA: Diagnosis not present

## 2018-06-07 DIAGNOSIS — E1165 Type 2 diabetes mellitus with hyperglycemia: Secondary | ICD-10-CM | POA: Diagnosis not present

## 2018-06-13 ENCOUNTER — Other Ambulatory Visit: Payer: Self-pay | Admitting: Family Medicine

## 2018-06-16 ENCOUNTER — Other Ambulatory Visit: Payer: Self-pay | Admitting: Family Medicine

## 2018-06-16 MED FILL — LANTUS SOLOSTAR 100 UNITS/M: 100 | 36 days supply | Qty: 9 | Fill #0

## 2018-06-16 NOTE — Telephone Encounter (Signed)
Copied from [place] [id number]. Topic: Quick Communication - Rx Refill/Question >> Jun 16, 2018  8:29 AM Carolyn Stare wrote: Medication   Insulin Glargine (LANTUS SOLOSTAR) 100 UNIT/ML Solostar Pen  Has the patient contacted their pharmacy yes  (Agent: If no, request that the patient contact the pharmacy for the refill.) (Agent: If yes, when and what did the pharmacy advise?)  Preferred Pharmacy   [facility]   Agent: Please be advised that RX refills may take up to 3 business days. We ask that you follow-up with your pharmacy.

## 2018-07-02 DIAGNOSIS — I5032 Chronic diastolic (congestive) heart failure: Secondary | ICD-10-CM | POA: Diagnosis not present

## 2018-07-02 DIAGNOSIS — R0602 Shortness of breath: Secondary | ICD-10-CM | POA: Diagnosis not present

## 2018-07-02 DIAGNOSIS — G4733 Obstructive sleep apnea (adult) (pediatric): Secondary | ICD-10-CM | POA: Diagnosis not present

## 2018-07-15 ENCOUNTER — Other Ambulatory Visit: Payer: Self-pay | Admitting: Family Medicine

## 2018-07-15 DIAGNOSIS — N6452 Nipple discharge: Secondary | ICD-10-CM

## 2018-07-24 ENCOUNTER — Other Ambulatory Visit: Payer: Self-pay | Admitting: Family Medicine

## 2018-07-25 NOTE — Progress Notes (Signed)
HPI:  Using dictation device. Unfortunately this device frequently misinterprets words/phrases.  [name] is a pleasant 78 y.o. here for follow up. Chronic medical problems summarized below were reviewed for changes and stability and were updated as needed below. These issues and their treatment remain stable for the most part.  Reports doing ok. Has unfortunately had some health issues/death in the family. Has seen the endocrinologist, but has not monitored her sugars as she was instructed. Reports 158 FBS this morning. No lows,  treatment intolerance or new symptoms.  Diabetes with polyneuropathy/CKD, HTN, HLD, Obesity: -meds: insulin, metformininconsistently, acei -hx poor compliance -sees podiatrist -not on asa - hx frequent falls -not on statin, declined/refused -referred to endocrinologist, Dr. Tobe Sos - first appt in Jan 2020  Morbid obesity: -poor diet, little activity  RLS: -on mirapex prior to establishing with me  Hx Sleep apnea, dCHF, HTN, HLD: -sees pulmonologist for sleep apneabut resists treatment recommendations -sees Dr. Percival Spanish, cardiology -started on as needed O2 by cardiology for low ambulatory O2 -meds: amlodipine, coreg, lasix, lisinopril -refuses statin  Hx Depression and chronic pain: -Osteoarthritis and DDD -sees [place] [facility] -cymbalta helps- but no longer taking and doesn't want to, declines CBT or psych managment -I have advised against opiods given her co-morbidities, age and frequent falls along with chronic rather then acute pain  Poor memory/gait abnormality: -referred and advised neurology evaluation - they did not go   Hx of Breast Ca: -sees oncologist for surveillance, follow up -on arimidex -sees breast surgeon - reports was suppose to have MRI but was too uncomfortable to do this and agrees to follow up with her breast specialist  Chronic back pain, OA, chronic pain, hx fxr radius: -sees Dr, Nelva Bush, Dr. Caralyn Guile   ROS:  See pertinent positives and negatives per HPI.  Past Medical History:  Diagnosis Date  . AK (actinic keratosis)   . Anxiety    occasional  . Breast cancer ([place])    R breast, s/p radiation 34Gy/78fx 04/29/10-05/05/10  . CTS (carpal tunnel syndrome)    Left  . Diabetes ([place])   . Diastolic CHF ([place])    Dx w/ hospitalization 06/2015 for respiratory failure  . Facial asymmetry, acquired 08/20/2016   From injury  . Falls frequently    "can not pick left leg up"  . Foot fracture   . Hearing loss    bilateral hearing aides  . History of environmental allergies   . History of kidney stones 20 years ago  . Hypertension   . MDD (major depressive disorder)   . No blood products ([place])  09/11/2013  . Osteoarthritis    hx shoulder, knee, back, wrist pain; saw Dr. Wynelle Link   . Personal history of radiation therapy   . RESTLESS LEGS SYNDROME 05/05/2007   Qualifier: Diagnosis of  By: Gwenette Greet MD, Armando Reichert   . RLS (restless legs syndrome)   . Sleep apnea     Past Surgical History:  Procedure Laterality Date  . APPENDECTOMY  age 1  . BREAST LUMPECTOMY Right 2012  . BREAST SURGERY  2012   rt breast lumpectomy  . flex signoidoscopy    . INCISION AND DRAINAGE PERIRECTAL ABSCESS    . JOINT REPLACEMENT Right 2006   knee  . TOTAL KNEE ARTHROPLASTY Left 08/06/2014   Procedure: LEFT TOTAL KNEE ARTHROPLASTY;  Surgeon: Gaynelle Arabian, MD;  Location: [facility];  Service: Orthopedics;  Laterality: Left;  . TUBAL LIGATION  1979    Family History  Problem  Relation Age of Onset  . Alcohol abuse Father   . Cancer Sister        breast  . Heart disease Paternal Grandfather   . Diabetes Other   . Obesity Other   . Heart disease Brother        Valve    SOCIAL HX: see hpi   Current Outpatient Medications:  .  ACCU-CHEK FASTCLIX LANCETS MISC, Use as directed to check blood sugar, Disp: 100 each, Rfl: 3 .  ACCU-CHEK GUIDE test strip, USE TO CHECK GLUCOSE THREE TIMES DAILY, Disp: 100 each,  Rfl: 12 .  amLODipine (NORVASC) 5 MG tablet, TAKE 1 TABLET BY MOUTH ONCE DAILY, Disp: 90 tablet, Rfl: 1 .  anastrozole (ARIMIDEX) 1 MG tablet, TAKE 1 TABLET BY MOUTH ONCE DAILY, Disp: 30 tablet, Rfl: 11 .  carvedilol (COREG) 3.125 MG tablet, TAKE 1 TABLET BY MOUTH TWICE DAILY WITH MEALS (NEEDS  APPOINTMENT), Disp: 60 tablet, Rfl: 5 .  cholecalciferol (VITAMIN D) 1000 units tablet, Take 1,000 Units by mouth 2 (two) times daily., Disp: , Rfl:  .  furosemide (LASIX) 40 MG tablet, TAKE 1 TABLET BY MOUTH ONCE DAILY, Disp: 90 tablet, Rfl: 1 .  Insulin Glargine (LANTUS SOLOSTAR) 100 UNIT/ML Solostar Pen, INJECT 25 UNITS INTO THE SKIN DAILY AT 10 PM (NEED APPT), Disp: 9 mL, Rfl: 1 .  Insulin Pen Needle (BD PEN NEEDLE NANO U/F) 32G X 4 MM MISC, USE AS DIRECTED ONCE DAILY, Disp: 100 each, Rfl: 3 .  lisinopril (PRINIVIL,ZESTRIL) 20 MG tablet, TAKE 1 TABLET BY MOUTH ONCE DAILY, Disp: 90 tablet, Rfl: 2 .  metFORMIN (GLUCOPHAGE) 1000 MG tablet, Take 1 tablet by mouth twice daily, Disp: 180 tablet, Rfl: 0 .  methocarbamol (ROBAXIN) 500 MG tablet, Take 500 mg by mouth every 6 (six) hours as needed for muscle spasms. Reported on 09/25/2015, Disp: , Rfl:  .  potassium chloride (K-DUR,KLOR-CON) 10 MEQ tablet, Take 1 tablet (10 mEq total) by mouth daily., Disp: 90 tablet, Rfl: 3 .  pramipexole (MIRAPEX) 1 MG tablet, TAKE 1 TABLET BY MOUTH TWICE DAILY, Disp: 180 tablet, Rfl: 1  EXAM:  Vitals:   07/26/18 0911  BP: 128/70  Pulse: 78  Temp: 98.2 F (36.8 C)    Body mass index is 40.6 kg/m.  GENERAL: vitals reviewed and listed above, alert, oriented, appears well hydrated and in no acute distress  HEENT: atraumatic, conjunttiva clear, no obvious abnormalities on inspection of external nose and ears  NECK: no obvious masses on inspection  LUNGS: clear to auscultation bilaterally, no wheezes, rales or rhonchi, good air movement  CV: HRRR, no peripheral edema  MS: moves all extremities without noticeable  abnormality  PSYCH: pleasant and cooperative, no obvious depression or anxiety  ASSESSMENT AND PLAN:  Discussed the following assessment and plan:  Hypertension associated with diabetes ([place])  Hyperlipidemia associated with type 2 diabetes mellitus (HCC)  Chronic diastolic congestive heart failure (HCC)  Mild episode of recurrent major depressive disorder (HCC)  Type 2 diabetes mellitus with diabetic neuropathy, with long-term current use of insulin (HCC)  Morbid obesity ([place])  -UTD on labs, glad now seeing endo, but encouraged to keep track of BS and notify her specialist -lifestyle recs - summarized below -follow up 3-4 months, sooner as needed   Patient Instructions  BEFORE YOU LEAVE: -follow up: 3-4 months  Please monitor your blood sugars and call your endocrinologist with these levels.   We recommend the following healthy lifestyle: 1) Small portions. But, make  sure to get regular (at least 3 per day), healthy meals and small healthy snacks if needed.  2) Eat a healthy clean diet.   TRY TO EAT: -at least 5-7 servings of low sugar, colorful, and nutrient rich vegetables per day (not corn, potatoes or bananas.) -berries are the best choice if you wish to eat fruit (only eat small amounts if trying to reduce weight)  -lean meets (fish, white meat of chicken or [place]) -vegan proteins for some meals - beans or tofu, whole grains, nuts and seeds -Replace bad fats with good fats - good fats include: fish, nuts and seeds, canola oil, olive oil -small amounts of low fat or non fat dairy -small amounts of100 % whole grains - check the lables -drink plenty of water  AVOID: -SUGAR, sweets, anything with added sugar, corn syrup or sweeteners - must read labels as even foods advertised as "healthy" often are loaded with sugar -if you must have a sweetener, small amounts of stevia may be best -sweetened beverages and artificially sweetened beverages -simple starches (rice,  bread, potatoes, pasta, chips, etc - small amounts of 100% whole grains are ok) -red meat, pork, butter -fried foods, fast food, processed food, excessive dairy, eggs and coconut.  3)Get at least 150 minutes of sweaty aerobic exercise per week.  4)Reduce stress - consider counseling, meditation and relaxation to balance other aspects of your life.     [name], DO

## 2018-07-26 ENCOUNTER — Encounter: Payer: Self-pay | Admitting: Family Medicine

## 2018-07-26 ENCOUNTER — Ambulatory Visit (INDEPENDENT_AMBULATORY_CARE_PROVIDER_SITE_OTHER): Payer: Medicare HMO | Admitting: Family Medicine

## 2018-07-26 VITALS — BP 128/70 | HR 78 | Temp 98.2°F | Ht 61.0 in | Wt 214.9 lb

## 2018-07-26 DIAGNOSIS — E1140 Type 2 diabetes mellitus with diabetic neuropathy, unspecified: Secondary | ICD-10-CM | POA: Diagnosis not present

## 2018-07-26 DIAGNOSIS — I5032 Chronic diastolic (congestive) heart failure: Secondary | ICD-10-CM

## 2018-07-26 DIAGNOSIS — F330 Major depressive disorder, recurrent, mild: Secondary | ICD-10-CM | POA: Diagnosis not present

## 2018-07-26 DIAGNOSIS — I152 Hypertension secondary to endocrine disorders: Secondary | ICD-10-CM

## 2018-07-26 DIAGNOSIS — Z794 Long term (current) use of insulin: Secondary | ICD-10-CM

## 2018-07-26 DIAGNOSIS — I10 Essential (primary) hypertension: Secondary | ICD-10-CM

## 2018-07-26 DIAGNOSIS — E1159 Type 2 diabetes mellitus with other circulatory complications: Secondary | ICD-10-CM

## 2018-07-26 DIAGNOSIS — I110 Hypertensive heart disease with heart failure: Secondary | ICD-10-CM | POA: Diagnosis not present

## 2018-07-26 DIAGNOSIS — Z6841 Body Mass Index (BMI) 40.0 and over, adult: Secondary | ICD-10-CM | POA: Diagnosis not present

## 2018-07-26 DIAGNOSIS — E1169 Type 2 diabetes mellitus with other specified complication: Secondary | ICD-10-CM

## 2018-07-26 DIAGNOSIS — E785 Hyperlipidemia, unspecified: Secondary | ICD-10-CM

## 2018-07-26 DIAGNOSIS — C50411 Malignant neoplasm of upper-outer quadrant of right female breast: Secondary | ICD-10-CM | POA: Diagnosis not present

## 2018-07-26 NOTE — Patient Instructions (Signed)
BEFORE YOU LEAVE: -follow up: 3-4 months  Please monitor your blood sugars and call your endocrinologist with these levels.   We recommend the following healthy lifestyle: 1) Small portions. But, make sure to get regular (at least 3 per day), healthy meals and small healthy snacks if needed.  2) Eat a healthy clean diet.   TRY TO EAT: -at least 5-7 servings of low sugar, colorful, and nutrient rich vegetables per day (not corn, potatoes or bananas.) -berries are the best choice if you wish to eat fruit (only eat small amounts if trying to reduce weight)  -lean meets (fish, white meat of chicken or [place]) -vegan proteins for some meals - beans or tofu, whole grains, nuts and seeds -Replace bad fats with good fats - good fats include: fish, nuts and seeds, canola oil, olive oil -small amounts of low fat or non fat dairy -small amounts of100 % whole grains - check the lables -drink plenty of water  AVOID: -SUGAR, sweets, anything with added sugar, corn syrup or sweeteners - must read labels as even foods advertised as "healthy" often are loaded with sugar -if you must have a sweetener, small amounts of stevia may be best -sweetened beverages and artificially sweetened beverages -simple starches (rice, bread, potatoes, pasta, chips, etc - small amounts of 100% whole grains are ok) -red meat, pork, butter -fried foods, fast food, processed food, excessive dairy, eggs and coconut.  3)Get at least 150 minutes of sweaty aerobic exercise per week.  4)Reduce stress - consider counseling, meditation and relaxation to balance other aspects of your life.

## 2018-07-31 DIAGNOSIS — G4733 Obstructive sleep apnea (adult) (pediatric): Secondary | ICD-10-CM | POA: Diagnosis not present

## 2018-07-31 DIAGNOSIS — I5032 Chronic diastolic (congestive) heart failure: Secondary | ICD-10-CM | POA: Diagnosis not present

## 2018-07-31 DIAGNOSIS — R062 Wheezing: Secondary | ICD-10-CM | POA: Diagnosis not present

## 2018-08-01 MED FILL — LANTUS SOLOSTAR 100 UNITS/M: 100 | 36 days supply | Qty: 9 | Fill #1

## 2018-08-08 ENCOUNTER — Other Ambulatory Visit: Payer: Self-pay | Admitting: Family Medicine

## 2018-08-09 ENCOUNTER — Other Ambulatory Visit: Payer: Self-pay | Admitting: *Deleted

## 2018-08-09 MED ORDER — FUROSEMIDE 40 MG PO TABS: 40.0000 mg | ORAL_TABLET | Freq: Every day | ORAL | 1 refills | Status: DC

## 2018-08-09 NOTE — Telephone Encounter (Signed)
Rx done. 

## 2018-08-31 DIAGNOSIS — R062 Wheezing: Secondary | ICD-10-CM | POA: Diagnosis not present

## 2018-08-31 DIAGNOSIS — I5032 Chronic diastolic (congestive) heart failure: Secondary | ICD-10-CM | POA: Diagnosis not present

## 2018-08-31 DIAGNOSIS — G4733 Obstructive sleep apnea (adult) (pediatric): Secondary | ICD-10-CM | POA: Diagnosis not present

## 2018-09-07 ENCOUNTER — Other Ambulatory Visit: Payer: Self-pay | Admitting: Family Medicine

## 2018-09-07 MED FILL — LANTUS SOLOSTAR 100 UNITS/M: 100 | 36 days supply | Qty: 9 | Fill #0

## 2018-09-08 ENCOUNTER — Other Ambulatory Visit: Payer: Self-pay | Admitting: Family Medicine

## 2018-09-12 ENCOUNTER — Other Ambulatory Visit: Payer: Self-pay | Admitting: Family Medicine

## 2018-09-12 ENCOUNTER — Other Ambulatory Visit: Payer: Self-pay

## 2018-09-12 ENCOUNTER — Ambulatory Visit
Admission: RE | Admit: 2018-09-12 | Discharge: 2018-09-12 | Disposition: A | Discharge status: Home / Self Care | Payer: Medicare HMO | Source: Ambulatory Visit | Attending: Family Medicine | Admitting: Family Medicine

## 2018-09-12 DIAGNOSIS — N6452 Nipple discharge: Secondary | ICD-10-CM | POA: Diagnosis not present

## 2018-09-14 DIAGNOSIS — M5136 Other intervertebral disc degeneration, lumbar region: Secondary | ICD-10-CM | POA: Diagnosis not present

## 2018-09-14 DIAGNOSIS — M545 Low back pain: Secondary | ICD-10-CM | POA: Diagnosis not present

## 2018-09-15 DIAGNOSIS — M5136 Other intervertebral disc degeneration, lumbar region: Secondary | ICD-10-CM | POA: Diagnosis not present

## 2018-09-30 DIAGNOSIS — G4733 Obstructive sleep apnea (adult) (pediatric): Secondary | ICD-10-CM | POA: Diagnosis not present

## 2018-09-30 DIAGNOSIS — R062 Wheezing: Secondary | ICD-10-CM | POA: Diagnosis not present

## 2018-09-30 DIAGNOSIS — I5032 Chronic diastolic (congestive) heart failure: Secondary | ICD-10-CM | POA: Diagnosis not present

## 2018-10-07 ENCOUNTER — Other Ambulatory Visit: Payer: Self-pay | Admitting: Family Medicine

## 2018-10-12 ENCOUNTER — Other Ambulatory Visit: Payer: Self-pay | Admitting: Family Medicine

## 2018-10-17 MED FILL — LANTUS SOLOSTAR 100 UNITS/M: 100 | 36 days supply | Qty: 9 | Fill #1

## 2018-10-18 ENCOUNTER — Encounter: Payer: Self-pay | Admitting: Physician Assistant

## 2018-10-18 ENCOUNTER — Ambulatory Visit (INDEPENDENT_AMBULATORY_CARE_PROVIDER_SITE_OTHER): Payer: Medicare HMO | Admitting: Physician Assistant

## 2018-10-18 ENCOUNTER — Other Ambulatory Visit: Payer: Self-pay

## 2018-10-18 VITALS — BP 142/90 | HR 74 | Temp 97.8°F | Resp 16 | Ht 60.0 in | Wt 212.0 lb

## 2018-10-18 DIAGNOSIS — E1140 Type 2 diabetes mellitus with diabetic neuropathy, unspecified: Secondary | ICD-10-CM

## 2018-10-18 DIAGNOSIS — G4733 Obstructive sleep apnea (adult) (pediatric): Secondary | ICD-10-CM | POA: Diagnosis not present

## 2018-10-18 DIAGNOSIS — I10 Essential (primary) hypertension: Secondary | ICD-10-CM

## 2018-10-18 DIAGNOSIS — Z170 Estrogen receptor positive status [ER+]: Secondary | ICD-10-CM | POA: Diagnosis not present

## 2018-10-18 DIAGNOSIS — I110 Hypertensive heart disease with heart failure: Secondary | ICD-10-CM | POA: Diagnosis not present

## 2018-10-18 DIAGNOSIS — F330 Major depressive disorder, recurrent, mild: Secondary | ICD-10-CM | POA: Diagnosis not present

## 2018-10-18 DIAGNOSIS — I5032 Chronic diastolic (congestive) heart failure: Secondary | ICD-10-CM

## 2018-10-18 DIAGNOSIS — Z794 Long term (current) use of insulin: Secondary | ICD-10-CM | POA: Diagnosis not present

## 2018-10-18 DIAGNOSIS — C50411 Malignant neoplasm of upper-outer quadrant of right female breast: Secondary | ICD-10-CM | POA: Diagnosis not present

## 2018-10-18 DIAGNOSIS — G894 Chronic pain syndrome: Secondary | ICD-10-CM

## 2018-10-18 NOTE — Patient Instructions (Signed)
Please be more consistent with taking the insulin at the same time each day. Keep a consistent diet. We will get records from the Endocrinologist and update your labs in the system.  Watch the late night snacking!!  Follow-up with me in August for your physical. Follow-up with specialists as scheduled.

## 2018-10-18 NOTE — Progress Notes (Deleted)
Virtual Visit via Video   I connected with patient on 10/18/18 at  1:00 PM EDT by a video enabled telemedicine application and verified that I am speaking with the correct person using two identifiers.  Location patient: Home Location provider: [facility], Office Persons participating in the virtual visit: Patient, Provider, [place] (Patina Moore)  I discussed the limitations of evaluation and management by telemedicine and the availability of in person appointments. The patient expressed understanding and agreed to proceed.  Subjective:   HPI:   ***  ROS:   See pertinent positives and negatives per HPI.  Patient Active Problem List   Diagnosis Date Noted   SOB (shortness of breath) 08/27/2017   Facial asymmetry, acquired 08/20/2016   Fracture of knee prosthesis ([place]) 08/01/2016   Obesity 02/20/2016   Chronic pain syndrome 02/19/2016   Mild episode of recurrent major depressive disorder ([place]) 08/02/2015   Chronic diastolic congestive heart failure ([place]) 07/16/2015   Type 2 diabetes mellitus with diabetic neuropathy ([place]) 09/27/2014   Hearing loss - has hearing aides, follow by audiologist 10/11/2013   No blood products ([place])  09/11/2013   Disorder of bone and cartilage 06/10/2010   Breast cancer of upper-outer quadrant of right female breast ([place]) 04/04/2010   RESTLESS LEGS SYNDROME 05/05/2007   Obstructive sleep apnea 03/01/2007   Essential hypertension 01/27/2007   Osteoarthritis 01/27/2007    Social History   Tobacco Use   Smoking status: Never Smoker   Smokeless tobacco: Never Used  Substance Use Topics   Alcohol use: No    Current Outpatient Medications:    ACCU-CHEK FASTCLIX LANCETS MISC, Use as directed to check blood sugar, Disp: 100 each, Rfl: 3   ACCU-CHEK GUIDE test strip, USE TO CHECK GLUCOSE THREE TIMES DAILY, Disp: 100 each, Rfl: 12   amLODipine (NORVASC) 5 MG tablet, TAKE 1 TABLET BY MOUTH ONCE DAILY, Disp:  90 tablet, Rfl: 1   anastrozole (ARIMIDEX) 1 MG tablet, TAKE 1 TABLET BY MOUTH ONCE DAILY, Disp: 30 tablet, Rfl: 11   carvedilol (COREG) 3.125 MG tablet, TAKE 1 TABLET BY MOUTH TWICE DAILY WITH MEALS (NEEDS  APPOINTMENT), Disp: 60 tablet, Rfl: 0   cholecalciferol (VITAMIN D) 1000 units tablet, Take 1,000 Units by mouth 2 (two) times daily., Disp: , Rfl:    furosemide (LASIX) 40 MG tablet, Take 1 tablet (40 mg total) by mouth daily., Disp: 90 tablet, Rfl: 1   Insulin Glargine (LANTUS SOLOSTAR) 100 UNIT/ML Solostar Pen, INJECT 25 UNITS UNDER THE SKIN ONCE A DAY AT 10PM (NEEDS OV), Disp: 9 mL, Rfl: 1   Insulin Pen Needle (BD PEN NEEDLE NANO U/F) 32G X 4 MM MISC, USE AS DIRECTED ONCE DAILY, Disp: 100 each, Rfl: 0   lisinopril (PRINIVIL,ZESTRIL) 20 MG tablet, TAKE 1 TABLET BY MOUTH ONCE DAILY, Disp: 90 tablet, Rfl: 2   metFORMIN (GLUCOPHAGE) 1000 MG tablet, Take 1 tablet by mouth twice daily, Disp: 180 tablet, Rfl: 0   methocarbamol (ROBAXIN) 500 MG tablet, Take 500 mg by mouth every 6 (six) hours as needed for muscle spasms. Reported on 09/25/2015, Disp: , Rfl:    potassium chloride (K-DUR,KLOR-CON) 10 MEQ tablet, Take 1 tablet (10 mEq total) by mouth daily., Disp: 90 tablet, Rfl: 3   pramipexole (MIRAPEX) 1 MG tablet, Take 1 tablet by mouth twice daily, Disp: 180 tablet, Rfl: 0  Allergies  Allergen Reactions   Sulfa Antibiotics Swelling    Other reaction(s): Facial Swelling "swelling lips"    Sulfonamide Derivatives Swelling  Lips Swelling   Other     BLOOD PRODUCT REFUSAL    Objective:   There were no vitals taken for this visit.  Patient is well-developed, well-nourished in no acute distress.  Resting comfortably *** at home.  Head is normocephalic, atraumatic.  No labored breathing.  Speech is clear and coherent with logical contest.  Patient is alert and oriented at baseline.  ***  Assessment and Plan:   ***.   Leeanne Rio, PA-C 10/18/2018

## 2018-10-18 NOTE — Progress Notes (Signed)
I have discussed the procedure for the virtual visit with the patient who has given consent to proceed with assessment and treatment.   Latausha Flamm S Lynnex Fulp, CMA     

## 2018-10-19 NOTE — Progress Notes (Signed)
Patient presents to clinic today to transfer care from Dr. Colin Benton at our [place] office.   Acute Concerns: Patient denies acute concerns at today's visit.   Chronic Issues: Hypertension and Chronic Diastolic HF -- Patient is followed by [facility] (Dr. Percival Spanish). Is currently on a regimen of Amlodipine 5 mg QD, Carvedilol 3.25 mg BID and Lisinopril 20 mg QD. Takes Lasix daily for HF. Denies any leg swelling, PND or orthopnea at present.  Endorses taking medications as directed most days. States she is a "bad patient" and forgets to take her medicines up to a few days per week. Has not had any of her medications yet today. Patient denies chest pain, palpitations, lightheadedness, dizziness, vision changes or frequent headaches.  BP Readings from Last 3 Encounters:  10/18/18 (!) 142/90  07/26/18 128/70  04/26/18 118/68   OSA -- Followed by Pulmonology. On CPAP nightly. Does get > 4 hours on CPAP per night.   DM II with neuropathy -- Currently on regimen of Metformin 1000 mg BID and Lantus 20 units daily. Notes she does take medications as directed but takes at varying times during the day each day. Notes very poor diet and little exercise. Was recently sent to Endo but states she is not going back as the provider wanted her to be more consistent with her diet and she is "going to eat what she wants". Is not checking glucose regularly despite daily use of insulin. Per prior PCP she has a history of poor compliance.   R Breast Cancer -- In remission. Followed by Dr. Lindi Adie. Is taking Anastrozole as directed.   MDD -- + history. No current medication. Notes mood is stable without panic attack, anhedonia, SI/HI.   Past Medical History:  Diagnosis Date  . AK (actinic keratosis)   . Anxiety    occasional  . Breast cancer ([place])    R breast, s/p radiation 34Gy/63fx 04/29/10-05/05/10  . CTS (carpal tunnel syndrome)    Left  . Diabetes ([place])   . Diastolic CHF ([place])    Dx w/  hospitalization 06/2015 for respiratory failure  . Facial asymmetry, acquired 08/20/2016   From injury  . Falls frequently    "can not pick left leg up"  . Foot fracture   . Hearing loss    bilateral hearing aides  . History of environmental allergies   . History of kidney stones 20 years ago  . Hypertension   . MDD (major depressive disorder)   . No blood products ([place])  09/11/2013  . Osteoarthritis    hx shoulder, knee, back, wrist pain; saw Dr. Wynelle Link   . Personal history of radiation therapy   . RESTLESS LEGS SYNDROME 05/05/2007   Qualifier: Diagnosis of  By: Gwenette Greet MD, Armando Reichert   . RLS (restless legs syndrome)   . Sleep apnea     Past Surgical History:  Procedure Laterality Date  . APPENDECTOMY  age 20  . BREAST LUMPECTOMY Right 2012  . BREAST SURGERY  2012   rt breast lumpectomy  . flex signoidoscopy    . INCISION AND DRAINAGE PERIRECTAL ABSCESS    . JOINT REPLACEMENT Right 2006   knee  . TOTAL KNEE ARTHROPLASTY Left 08/06/2014   Procedure: LEFT TOTAL KNEE ARTHROPLASTY;  Surgeon: Gaynelle Arabian, MD;  Location: [facility];  Service: Orthopedics;  Laterality: Left;  . TUBAL LIGATION  1979    Current Outpatient Medications on File Prior to Visit  Medication Sig Dispense Refill  . amLODipine (  NORVASC) 5 MG tablet TAKE 1 TABLET BY MOUTH ONCE DAILY 90 tablet 1  . anastrozole (ARIMIDEX) 1 MG tablet TAKE 1 TABLET BY MOUTH ONCE DAILY 30 tablet 11  . carvedilol (COREG) 3.125 MG tablet TAKE 1 TABLET BY MOUTH TWICE DAILY WITH MEALS (NEEDS  APPOINTMENT) 60 tablet 0  . furosemide (LASIX) 40 MG tablet Take 1 tablet (40 mg total) by mouth daily. 90 tablet 1  . Insulin Glargine (LANTUS SOLOSTAR) 100 UNIT/ML Solostar Pen INJECT 25 UNITS UNDER THE SKIN ONCE A DAY AT 10PM (NEEDS OV) (Patient taking differently: INJECT 20 UNITS UNDER THE SKIN ONCE A DAY AT 10PM (NEEDS OV)) 9 mL 1  . Insulin Pen Needle (BD PEN NEEDLE NANO U/F) 32G X 4 MM MISC USE AS DIRECTED ONCE DAILY 100 each 0  .  lisinopril (PRINIVIL,ZESTRIL) 20 MG tablet TAKE 1 TABLET BY MOUTH ONCE DAILY 90 tablet 2  . metFORMIN (GLUCOPHAGE) 1000 MG tablet Take 1 tablet by mouth twice daily 180 tablet 0  . methocarbamol (ROBAXIN) 500 MG tablet Take 500 mg by mouth every 6 (six) hours as needed for muscle spasms. Reported on 09/25/2015    . pramipexole (MIRAPEX) 1 MG tablet Take 1 tablet by mouth twice daily 180 tablet 0  . ACCU-CHEK FASTCLIX LANCETS MISC Use as directed to check blood sugar (Patient not taking: Reported on 10/18/2018) 100 each 3  . ACCU-CHEK GUIDE test strip USE TO CHECK GLUCOSE THREE TIMES DAILY (Patient not taking: Reported on 10/18/2018) 100 each 12   No current facility-administered medications on file prior to visit.     Allergies  Allergen Reactions  . Sulfa Antibiotics Swelling    Other reaction(s): Facial Swelling "swelling lips"   . Sulfonamide Derivatives Swelling    Lips Swelling  . Other     BLOOD PRODUCT REFUSAL    Family History  Problem Relation Age of Onset  . Alcohol abuse Father   . Cancer Sister        breast  . Heart disease Paternal Grandfather   . Diabetes Other   . Obesity Other   . Heart disease Brother        Valve    Social History   Socioeconomic History  . Marital status: Married    Spouse name: Not on file  . Number of children: 3  . Years of education: Not on file  . Highest education level: Not on file  Occupational History  . Not on file  Social Needs  . Financial resource strain: Not on file  . Food insecurity:    Worry: Not on file    Inability: Not on file  . Transportation needs:    Medical: Not on file    Non-medical: Not on file  Tobacco Use  . Smoking status: Never Smoker  . Smokeless tobacco: Never Used  Substance and Sexual Activity  . Alcohol use: No  . Drug use: No  . Sexual activity: Yes  Lifestyle  . Physical activity:    Days per week: Not on file    Minutes per session: Not on file  . Stress: Not on file  Relationships   . Social connections:    Talks on phone: Not on file    Gets together: Not on file    Attends religious service: Not on file    Active member of club or organization: Not on file    Attends meetings of clubs or organizations: Not on file    Relationship status: Not on  file  . Intimate partner violence:    Fear of current or ex partner: Not on file    Emotionally abused: Not on file    Physically abused: Not on file    Forced sexual activity: Not on file  Other Topics Concern  . Not on file  Social History Narrative         Home Situation: lives with husband and granddaughter      Spiritual Beliefs: Jehovah Witness - no blood products               ROS Pertinent ROS are listed in the HPI  BP (!) 142/90   Pulse 74   Temp 97.8 F (36.6 C) (Skin)   Resp 16   Ht 5' (1.524 m)   Wt 212 lb (96.2 kg)   SpO2 94%   BMI 41.40 kg/m   Physical Exam   Assessment/Plan: 1. Obstructive sleep apnea Continue CPAP. Follow-up with Pulmonology as scheduled.   2. Essential hypertension 3. Chronic diastolic congestive heart failure ([place]) Euvolemic today on examination. BP mildly elevated but patient has not taken her medications yet today. Discussed importance of medication compliance. Patient to return on medications for nurse visit so BP can be rechecked.   4. Type 2 diabetes mellitus with diabetic neuropathy, with long-term current use of insulin ([place]) Discussed importance of consistency with insulin dosing and timing. She is to start checking glucose once each morning and records. Endo recently checked her A1C and patient endorses at 7.0. Will obtain records from Endo to further assess. Follow-up 2 weeks.   5. Malignant neoplasm of upper-outer quadrant of right breast in female, estrogen receptor positive ([place]) Continue management per Oncology.  6. Chronic pain syndrome Followed by Ortho. Continue care per specialist. She has been made aware that I will not provide chronic  narcotic pain medications for her.   7. Mild episode of recurrent major depressive disorder ([place]) Asymptomatic at present without medication. Will monitor.    Leeanne Rio, PA-C

## 2018-10-27 ENCOUNTER — Other Ambulatory Visit: Payer: Self-pay

## 2018-10-27 ENCOUNTER — Ambulatory Visit: Payer: Medicare HMO | Admitting: Family Medicine

## 2018-10-31 DIAGNOSIS — G4733 Obstructive sleep apnea (adult) (pediatric): Secondary | ICD-10-CM | POA: Diagnosis not present

## 2018-10-31 DIAGNOSIS — I5032 Chronic diastolic (congestive) heart failure: Secondary | ICD-10-CM | POA: Diagnosis not present

## 2018-10-31 DIAGNOSIS — R062 Wheezing: Secondary | ICD-10-CM | POA: Diagnosis not present

## 2018-11-10 ENCOUNTER — Other Ambulatory Visit: Payer: Self-pay | Admitting: Family Medicine

## 2018-11-15 ENCOUNTER — Other Ambulatory Visit: Payer: Self-pay | Admitting: Family Medicine

## 2018-11-17 MED ORDER — CARVEDILOL 3.125 MG PO TABS: ORAL_TABLET | ORAL | 0 refills | Status: DC

## 2018-11-17 NOTE — Addendum Note (Signed)
Addended by: Katina Dung on: 11/17/2018 02:42 PM   Modules accepted: Orders

## 2018-11-17 NOTE — Telephone Encounter (Signed)
Pt needs refill on carvedilol (COREG) 3.125 MG tablet Send to   [facility], [place] [phone] (Phone) [phone] (Fax)

## 2018-11-23 ENCOUNTER — Telehealth: Payer: Self-pay | Admitting: Family Medicine

## 2018-11-23 MED ORDER — METFORMIN HCL 1000 MG PO TABS: 1000.0000 mg | ORAL_TABLET | Freq: Two times a day (BID) | ORAL | 0 refills | Status: DC

## 2018-11-23 NOTE — Telephone Encounter (Signed)
Medication filled to pharmacy as requested.   

## 2018-11-23 NOTE — Telephone Encounter (Signed)
Pt called in and about this refill, it looks last is was sent to Dr Maudie Mercury in error.  Will pt need an appt before she can get this refill?  Pleaes advise   Best number [phone]

## 2018-11-23 NOTE — Addendum Note (Signed)
Addended by: Davis Gourd on: 11/23/2018 04:47 PM   Modules accepted: Orders

## 2018-11-24 ENCOUNTER — Other Ambulatory Visit: Payer: Self-pay | Admitting: Physician Assistant

## 2018-11-24 MED ORDER — LANTUS SOLOSTAR 100 UNIT/ML ~~LOC~~ SOPN: PEN_INJECTOR | SUBCUTANEOUS | 0 refills | Status: DC

## 2018-11-24 MED FILL — LANTUS SOLOSTAR 100 UNITS/M: 100 | 36 days supply | Qty: 9 | Fill #0

## 2018-11-24 NOTE — Telephone Encounter (Addendum)
Pt stated that her Insulin Glargine (LANTUS SOLOSTAR) 100 UNIT/ML Solostar Pen  Needed to be refilled as well and it may have been sent to Dr. Maudie Mercury also. Please advise.  [place], [place] - [place] [phone] (Phone) [phone] (Fax)

## 2018-11-24 NOTE — Telephone Encounter (Signed)
I received a fax regarding this yesterday afternoon and signed and faxed back a refill. Not sure why it wasn't received. I have also just received a message from pharmacy at [facility] via secure chat and have refilled the medication electronically.

## 2018-11-30 DIAGNOSIS — R062 Wheezing: Secondary | ICD-10-CM | POA: Diagnosis not present

## 2018-11-30 DIAGNOSIS — I5032 Chronic diastolic (congestive) heart failure: Secondary | ICD-10-CM | POA: Diagnosis not present

## 2018-11-30 DIAGNOSIS — G4733 Obstructive sleep apnea (adult) (pediatric): Secondary | ICD-10-CM | POA: Diagnosis not present

## 2018-12-03 ENCOUNTER — Other Ambulatory Visit: Payer: Self-pay | Admitting: Hematology and Oncology

## 2018-12-05 ENCOUNTER — Telehealth: Payer: Self-pay | Admitting: Hematology and Oncology

## 2018-12-05 NOTE — Telephone Encounter (Signed)
Scheduled appt per 7/20 sch message - vmail full unable to leave message - sent reminder letter in the mail with appt date and time

## 2018-12-13 ENCOUNTER — Telehealth: Payer: Self-pay | Admitting: *Deleted

## 2018-12-13 ENCOUNTER — Other Ambulatory Visit: Payer: Self-pay | Admitting: Emergency Medicine

## 2018-12-13 DIAGNOSIS — I10 Essential (primary) hypertension: Secondary | ICD-10-CM

## 2018-12-13 DIAGNOSIS — E1159 Type 2 diabetes mellitus with other circulatory complications: Secondary | ICD-10-CM

## 2018-12-13 DIAGNOSIS — I152 Hypertension secondary to endocrine disorders: Secondary | ICD-10-CM

## 2018-12-13 DIAGNOSIS — E1140 Type 2 diabetes mellitus with diabetic neuropathy, unspecified: Secondary | ICD-10-CM

## 2018-12-13 MED ORDER — CARVEDILOL 3.125 MG PO TABS: ORAL_TABLET | ORAL | 0 refills | Status: DC

## 2018-12-13 MED ORDER — PRAMIPEXOLE DIHYDROCHLORIDE 1 MG PO TABS: 1.0000 mg | ORAL_TABLET | Freq: Two times a day (BID) | ORAL | 1 refills | Status: DC

## 2018-12-13 MED ORDER — LANTUS SOLOSTAR 100 UNIT/ML ~~LOC~~ SOPN: PEN_INJECTOR | SUBCUTANEOUS | 1 refills | Status: DC

## 2018-12-13 MED ORDER — AMLODIPINE BESYLATE 5 MG PO TABS: 5.0000 mg | ORAL_TABLET | Freq: Every day | ORAL | 1 refills | Status: DC

## 2018-12-13 MED ORDER — LISINOPRIL 20 MG PO TABS: 20.0000 mg | ORAL_TABLET | Freq: Every day | ORAL | 1 refills | Status: DC

## 2018-12-13 MED ORDER — FUROSEMIDE 40 MG PO TABS: 40.0000 mg | ORAL_TABLET | Freq: Every day | ORAL | 1 refills | Status: DC

## 2018-12-13 MED ORDER — METFORMIN HCL 1000 MG PO TABS: 1000.0000 mg | ORAL_TABLET | Freq: Two times a day (BID) | ORAL | 1 refills | Status: DC

## 2018-12-13 NOTE — Telephone Encounter (Signed)
Patient called in and states that she has requested that all of her prescriptions be sent to mail order and that it has not been done. She said the request was for "all" of her medications.  Call got disconnected before I could verify anything further with patient.

## 2018-12-13 NOTE — Assessment & Plan Note (Deleted)
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 She has been on antiestrogen therapy with Arimidex 1 mg daily since January 2012 Currently on extended adjuvant therapy for 7-10 years (severe hearing loss)  Arimidex toxicities: 1. Bone density 04/08/2015: T score -1.4 mild osteopenia. 2. Denies any hot flashes or myalgias. Patient continues to take anastrozole therapy.  Breast cancer surveillance: 1. Breast exam 10/13/2016 is normal 2. Mammogram  09/12/2018  benign breast density category B, no suspicious findings to explain right nipple discharge. Dr. Excell Seltzer has ordered a breast MRI.  Patient spends summers in [place] and the rest of the year she spends in [place]. Return to clinic in 1 year for follow-up

## 2018-12-13 NOTE — Telephone Encounter (Signed)
Medications prescribed by PCP will be refilled to the mail order pharmacy [facility]. Keep scheduled appointment with PCP in August

## 2018-12-14 ENCOUNTER — Telehealth: Payer: Self-pay | Admitting: Hematology and Oncology

## 2018-12-14 NOTE — Telephone Encounter (Signed)
Called pt per 7/29 sch message - unable to reach pt  Left message for pt to call back to reschedule.

## 2018-12-19 ENCOUNTER — Telehealth: Payer: Self-pay

## 2018-12-19 ENCOUNTER — Other Ambulatory Visit: Payer: Self-pay

## 2018-12-19 MED ORDER — CARVEDILOL 3.125 MG PO TABS: 3.1250 mg | ORAL_TABLET | Freq: Two times a day (BID) | ORAL | 0 refills | Status: DC

## 2018-12-19 NOTE — Telephone Encounter (Signed)
LMOVM informing patient that partial fill has been sent to [facility]

## 2018-12-19 NOTE — Telephone Encounter (Signed)
Ok to send 2 week supply to local pharmacy of patient's choosing

## 2018-12-19 NOTE — Telephone Encounter (Signed)
[facility] called requesting a partial fill for Carvedilol 3.125 sent to a local pharmacy. It will take 8-10 business days for patient to receive medication in the mail and she is completely out. Please advise

## 2018-12-20 ENCOUNTER — Ambulatory Visit: Payer: Medicare HMO | Admitting: Hematology and Oncology

## 2018-12-20 DIAGNOSIS — H5210 Myopia, unspecified eye: Secondary | ICD-10-CM | POA: Diagnosis not present

## 2018-12-20 LAB — HM DIABETES EYE EXAM

## 2018-12-21 NOTE — Assessment & Plan Note (Signed)
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 She has been on antiestrogen therapy with Arimidex 1 mg daily since January 2012 Currently on extended adjuvant therapy for 7-10 years (severe hearing loss)  Arimidex toxicities: 1. Bone density 04/08/2015: T score -1.4 mild osteopenia. 2. Denies any hot flashes or myalgias. I discussed with the patient the recent study ABCSG 16 with suggested that 7 years of therapy may be equal and to 10 years. There was no additional benefit on this study be on 7 years. For now we will continue this treatment until 2019 and make a final decision at that point.  Breast cancer surveillance: 1. Breast exam 10/13/2016 is normal 2. Mammogram 06/10/16 was normal 09/08/2017: Breast biopsy: Benign  Patient spends summers in [place] and the rest of the year she spends in [place]. Return to clinic in 1 year for follow-up

## 2018-12-27 ENCOUNTER — Other Ambulatory Visit: Payer: Self-pay

## 2018-12-27 ENCOUNTER — Ambulatory Visit (INDEPENDENT_AMBULATORY_CARE_PROVIDER_SITE_OTHER): Payer: Medicare HMO | Admitting: Physician Assistant

## 2018-12-27 ENCOUNTER — Encounter: Payer: Self-pay | Admitting: Emergency Medicine

## 2018-12-27 ENCOUNTER — Encounter: Payer: Self-pay | Admitting: Physician Assistant

## 2018-12-27 VITALS — BP 138/80 | HR 68 | Temp 98.3°F | Resp 16 | Ht 60.0 in | Wt 212.0 lb

## 2018-12-27 DIAGNOSIS — M8589 Other specified disorders of bone density and structure, multiple sites: Secondary | ICD-10-CM | POA: Diagnosis not present

## 2018-12-27 DIAGNOSIS — Z1211 Encounter for screening for malignant neoplasm of colon: Secondary | ICD-10-CM | POA: Diagnosis not present

## 2018-12-27 DIAGNOSIS — Z0000 Encounter for general adult medical examination without abnormal findings: Secondary | ICD-10-CM | POA: Diagnosis not present

## 2018-12-27 DIAGNOSIS — I10 Essential (primary) hypertension: Secondary | ICD-10-CM | POA: Diagnosis not present

## 2018-12-27 DIAGNOSIS — F330 Major depressive disorder, recurrent, mild: Secondary | ICD-10-CM

## 2018-12-27 DIAGNOSIS — E1140 Type 2 diabetes mellitus with diabetic neuropathy, unspecified: Secondary | ICD-10-CM | POA: Diagnosis not present

## 2018-12-27 DIAGNOSIS — Z794 Long term (current) use of insulin: Secondary | ICD-10-CM | POA: Diagnosis not present

## 2018-12-27 DIAGNOSIS — I5032 Chronic diastolic (congestive) heart failure: Secondary | ICD-10-CM | POA: Diagnosis not present

## 2018-12-27 DIAGNOSIS — Z6841 Body Mass Index (BMI) 40.0 and over, adult: Secondary | ICD-10-CM

## 2018-12-27 LAB — COMPREHENSIVE METABOLIC PANEL
ALT: 21 U/L (ref 0–35)
AST: 25 U/L (ref 0–37)
Albumin: 4.1 g/dL (ref 3.5–5.2)
Alkaline Phosphatase: 104 U/L (ref 39–117)
BUN: 17 mg/dL (ref 6–23)
CO2: 32 mEq/L (ref 19–32)
Calcium: 9.3 mg/dL (ref 8.4–10.5)
Chloride: 97 mEq/L (ref 96–112)
Creatinine, Ser: 0.58 mg/dL (ref 0.40–1.20)
GFR: 100.09 mL/min (ref >60.00)
Glucose, Bld: 128 mg/dL — ABNORMAL HIGH (ref 70–99)
Potassium: 3.9 mEq/L (ref 3.5–5.1)
Sodium: 136 mEq/L (ref 135–145)
Total Bilirubin: 0.5 mg/dL (ref 0.2–1.2)
Total Protein: 7.2 g/dL (ref 6.0–8.3)

## 2018-12-27 LAB — CBC WITH DIFFERENTIAL/PLATELET
Basophils Absolute: 0 10*3/uL (ref 0.0–0.1)
Basophils Relative: 0.4 % (ref 0.0–3.0)
Eosinophils Absolute: 0.2 10*3/uL (ref 0.0–0.7)
Eosinophils Relative: 2.6 % (ref 0.0–5.0)
HCT: 41.6 % (ref 36.0–46.0)
Hemoglobin: 14 g/dL (ref 12.0–15.0)
Lymphocytes Relative: 16.8 % (ref 12.0–46.0)
Lymphs Abs: 1.5 10*3/uL (ref 0.7–4.0)
MCHC: 33.7 g/dL (ref 30.0–36.0)
MCV: 93.5 fl (ref 78.0–100.0)
Monocytes Absolute: 0.7 10*3/uL (ref 0.1–1.0)
Monocytes Relative: 7.3 % (ref 3.0–12.0)
Neutro Abs: 6.6 10*3/uL (ref 1.4–7.7)
Neutrophils Relative %: 72.9 % (ref 43.0–77.0)
Platelets: 242 10*3/uL (ref 150.0–400.0)
RBC: 4.45 Mil/uL (ref 3.87–5.11)
RDW: 13.7 % (ref 11.5–15.5)
WBC: 9 10*3/uL (ref 4.0–10.5)

## 2018-12-27 LAB — LIPID PANEL
Cholesterol: 136 mg/dL (ref 0–200)
HDL: 34.3 mg/dL — ABNORMAL LOW (ref >39.00)
LDL Cholesterol: 79 mg/dL (ref 0–99)
NonHDL: 101.64
Total CHOL/HDL Ratio: 4
Triglycerides: 113 mg/dL (ref 0.0–149.0)
VLDL: 22.6 mg/dL (ref 0.0–40.0)

## 2018-12-27 LAB — HEMOGLOBIN A1C: Hgb A1c MFr Bld: 8.1 % — ABNORMAL HIGH (ref 4.6–6.5)

## 2018-12-27 MED ORDER — GABAPENTIN 100 MG PO CAPS: 100.0000 mg | ORAL_CAPSULE | Freq: Two times a day (BID) | ORAL | 3 refills | Status: DC

## 2018-12-27 NOTE — Progress Notes (Signed)
Patient Care Team: Brunetta Jeans, PA-C as PCP - General (Family Medicine) Suella Broad, MD as Consulting Physician (Physical Medicine and Rehabilitation) Minus Breeding, MD as Consulting Physician (Cardiology) Madelin Headings, DO (Optometry)  DIAGNOSIS:    ICD-10-CM   1. 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 with above-mentioned history of right breast cancer treated with lumpectomy, radiation, and who is currently on Arimidex therapy. I last saw her 2 years ago. Mammogram on 09/12/18 showed no evidence of malignancy. She presents to the clinic today for follow-up.  She has profound sensorineural hearing loss.  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  REVIEW OF SYSTEMS:   Constitutional: Denies fevers, chills or abnormal weight loss Eyes: Denies blurriness of vision Ears, nose, mouth, throat, and face: Denies mucositis or sore throat Respiratory: Denies cough, dyspnea or wheezes Cardiovascular: Denies palpitation, chest discomfort Gastrointestinal: Denies nausea, heartburn or change in bowel habits Skin: Denies abnormal skin rashes Lymphatics: Denies new lymphadenopathy or easy bruising Neurological: Denies numbness, tingling or new weaknesses  Behavioral/Psych: Mood is stable, no new changes  Extremities: Arthritis in the knees Breast: denies any pain or lumps or nodules in either breasts All other systems were reviewed with the patient and are negative.  I have reviewed the past medical history, past surgical history, social history and family history with the patient and they are unchanged from previous note.  ALLERGIES:  is allergic to sulfa antibiotics; sulfonamide derivatives; and other.  MEDICATIONS:  Current Outpatient Medications  Medication Sig Dispense Refill  . ACCU-CHEK FASTCLIX LANCETS MISC Use as directed to check blood sugar 100 each 3  . ACCU-CHEK GUIDE test strip USE TO CHECK GLUCOSE THREE TIMES DAILY 100 each 12  . amLODipine (NORVASC) 5 MG tablet Take 1 tablet (5 mg total) by mouth daily. 90 tablet 1  . anastrozole (ARIMIDEX) 1 MG tablet Take 1 tablet by mouth once daily 30 tablet 0  . carvedilol (COREG) 3.125 MG tablet TAKE 1 TABLET BY MOUTH TWICE DAILY WITH MEALS 60 tablet 0  . carvedilol (COREG) 3.125 MG tablet Take 1 tablet (3.125 mg total) by mouth 2 (two) times daily with a meal. 30 tablet 0  . furosemide (LASIX) 40 MG tablet Take 1 tablet (40 mg total) by mouth daily. 90 tablet 1  . gabapentin (NEURONTIN) 100 MG capsule Take 1 capsule (100 mg total) by mouth 2 (two) times daily. 60 capsule 3  . Insulin Glargine (LANTUS SOLOSTAR) 100 UNIT/ML Solostar Pen INJECT 25 UNITS UNDER THE SKIN ONCE A DAY AT 10PM 15 mL 1  . Insulin Pen Needle (BD PEN NEEDLE NANO U/F) 32G X 4 MM MISC USE AS DIRECTED ONCE DAILY 100 each 0  . lisinopril (ZESTRIL) 20 MG tablet Take 1  tablet (20 mg total) by mouth daily. 90 tablet 1  . metFORMIN (GLUCOPHAGE) 1000 MG tablet Take 1 tablet (1,000 mg total) by mouth 2 (two) times daily. 180 tablet 1  . methocarbamol (ROBAXIN) 500 MG tablet Take 500 mg by mouth every 6 (six) hours as needed for muscle spasms. Reported on 09/25/2015    . pramipexole (MIRAPEX) 1 MG tablet Take 1 tablet (1 mg  total) by mouth 2 (two) times daily. 180 tablet 1   No current facility-administered medications for this visit.     PHYSICAL EXAMINATION: ECOG PERFORMANCE STATUS: 2 - Symptomatic, <50% confined to bed  Vitals:   12/28/18 1140  BP: (!) 166/88  Pulse: 77  Resp: 18  Temp: 98.2 F (36.8 C)  SpO2: 92%   Filed Weights   12/28/18 1140  Weight: 210 lb 1.6 oz (95.3 kg)    GENERAL: alert, no distress and comfortable SKIN: skin color, texture, turgor are normal, no rashes or significant lesions EYES: normal, Conjunctiva are pink and non-injected, sclera clear OROPHARYNX: no exudate, no erythema and lips, buccal mucosa, and tongue normal  NECK: supple, thyroid normal size, non-tender, without nodularity LYMPH: no palpable lymphadenopathy in the cervical, axillary or inguinal LUNGS: clear to auscultation and percussion with normal breathing effort HEART: regular rate & rhythm and no murmurs and no lower extremity edema ABDOMEN: abdomen soft, non-tender and normal bowel sounds MUSCULOSKELETAL: no cyanosis of digits and no clubbing  NEURO: alert & oriented x 3 with fluent speech, no focal motor/sensory deficits EXTREMITIES: No lower extremity edema  LABORATORY DATA:  I have reviewed the data as listed CMP Latest Ref Rng & Units 12/27/2018 05/27/2018 01/20/2018  Glucose 70 - 99 mg/dL 128(H) 78 142(H)  BUN 6 - 23 mg/dL '17 19 19  ' Creatinine 0.40 - 1.20 mg/dL 0.58 0.68 0.65  Sodium 135 - 145 mEq/L 136 141 139  Potassium 3.5 - 5.1 mEq/L 3.9 3.6 4.3  Chloride 96 - 112 mEq/L 97 97 96  CO2 19 - 32 mEq/L 32 35(H) 36(H)  Calcium 8.4 - 10.5 mg/dL 9.3 9.2 9.3  Total Protein 6.0 - 8.3 g/dL 7.2 - -  Total Bilirubin 0.2 - 1.2 mg/dL 0.5 - -  Alkaline Phos 39 - 117 U/L 104 - -  AST 0 - 37 U/L 25 - -  ALT 0 - 35 U/L 21 - -    Lab Results  Component Value Date   WBC 9.0 12/27/2018   HGB 14.0 12/27/2018   HCT 41.6 12/27/2018   MCV 93.5 12/27/2018   PLT 242.0 12/27/2018   NEUTROABS 6.6  12/27/2018    ASSESSMENT & PLAN:  Breast cancer of upper-outer quadrant of right female breast ([place]) 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 She has been on antiestrogen therapy with Arimidex 1 mg daily since January 2012 Stopped anastrozole 12/28/2018 (severe hearing loss)  Arimidex toxicities: 1. Bone density 04/08/2015: T score -1.4 mild osteopenia. 2. Denies any hot flashes or myalgias. I instructed her to discontinue anastrozole at this time.  Breast cancer surveillance: 1. Breast exam 10/13/2016 is normal 2. Mammogram  09/12/2018  benign: Nipple discharge but no evidence of any malignancy. 09/08/2017: Breast biopsy: Benign  Patient spends summers in [place] and the rest of the year she spends in [place]. Patient can be seen on an as-needed basis.  No orders of the defined types were placed in this encounter.  The  patient has a good understanding of the overall plan. she agrees with it. she will call with any problems that may develop before the next visit here.  Nicholas Lose, MD 12/28/2018  Julious Oka Dorshimer am acting as scribe for Dr. Nicholas Lose.  I have reviewed the above documentation for accuracy and completeness, and I agree with the above.

## 2018-12-27 NOTE — Patient Instructions (Addendum)
Please go to the lab for blood work.   Our office will call you with your results unless you have chosen to receive results via MyChart.  If your blood work is normal we will follow-up each year for physicals and as scheduled for chronic medical problems.  If anything is abnormal we will treat accordingly and get you in for a follow-up.  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  You will be contacted to schedule your next osteoporosis screen. You will also receive a colon cancer screening kit in the mail to complete.  Follow-up with me in 1 month regarding neuropathy pain.    Preventive Care 32 Years and Older, Female Preventive care refers to lifestyle choices and visits with your health care provider that can promote health and wellness. This includes:  A yearly physical exam. This is also called an annual well check.  Regular dental and eye exams.  Immunizations.  Screening for certain conditions.  Healthy lifestyle choices, such as diet and exercise. What can I expect for my preventive care visit? Physical exam Your health care provider will check:  Height and weight. These may be used to calculate body mass index (BMI), which is a measurement that tells if you are at a healthy weight.  Heart rate and blood pressure.  Your skin for abnormal spots. Counseling Your health care provider may ask you questions about:  Alcohol, tobacco, and drug use.  Emotional well-being.  Home and relationship well-being.  Sexual activity.  Eating habits.  History of falls.  Memory and ability to understand (cognition).  Work and work Statistician.  Pregnancy and menstrual history. What immunizations do I need?  Influenza (flu) vaccine  This is recommended every year. Tetanus, diphtheria, and pertussis (Tdap) vaccine  You may need a Td booster every 10 years. Varicella (chickenpox) vaccine  You may need this vaccine if you have not  already been vaccinated. Zoster (shingles) vaccine  You may need this after age 57. Pneumococcal conjugate (PCV13) vaccine  One dose is recommended after age 74. Pneumococcal polysaccharide (PPSV23) vaccine  One dose is recommended after age 67. Measles, mumps, and rubella (MMR) vaccine  You may need at least one dose of MMR if you were born in 1957 or later. You may also need a second dose. Meningococcal conjugate (MenACWY) vaccine  You may need this if you have certain conditions. Hepatitis A vaccine  You may need this if you have certain conditions or if you travel or work in places where you may be exposed to hepatitis A. Hepatitis B vaccine  You may need this if you have certain conditions or if you travel or work in places where you may be exposed to hepatitis B. Haemophilus influenzae type b (Hib) vaccine  You may need this if you have certain conditions. You may receive vaccines as individual doses or as more than one vaccine together in one shot (combination vaccines). Talk with your health care provider about the risks and benefits of combination vaccines. What tests do I need? Blood tests  Lipid and cholesterol levels. These may be checked every 5 years, or more frequently depending on your overall health.  Hepatitis C test.  Hepatitis B test. Screening  Lung cancer screening. You may have this screening every year starting at age 75 if you have a 30-pack-year history of smoking and currently smoke or have quit within the past 15 years.  Colorectal cancer screening. All adults should have this screening  starting at age 67 and continuing until age 28. Your health care provider may recommend screening at age 60 if you are at increased risk. You will have tests every 1-10 years, depending on your results and the type of screening test.  Diabetes screening. This is done by checking your blood sugar (glucose) after you have not eaten for a while (fasting). You may have  this done every 1-3 years.  Mammogram. This may be done every 1-2 years. Talk with your health care provider about how often you should have regular mammograms.  BRCA-related cancer screening. This may be done if you have a family history of breast, ovarian, tubal, or peritoneal cancers. Other tests  Sexually transmitted disease (STD) testing.  Bone density scan. This is done to screen for osteoporosis. You may have this done starting at age 80. Follow these instructions at home: Eating and drinking  Eat a diet that includes fresh fruits and vegetables, whole grains, lean protein, and low-fat dairy products. Limit your intake of foods with high amounts of sugar, saturated fats, and salt.  Take vitamin and mineral supplements as recommended by your health care provider.  Do not drink alcohol if your health care provider tells you not to drink.  If you drink alcohol: ? Limit how much you have to 0-1 drink a day. ? Be aware of how much alcohol is in your drink. In the [place] one drink equals one 12 oz bottle of beer (355 mL), one 5 oz glass of wine (148 mL), or one 1 oz glass of hard liquor (44 mL). Lifestyle  Take daily care of your teeth and gums.  Stay active. Exercise for at least 30 minutes on 5 or more days each week.  Do not use any products that contain nicotine or tobacco, such as cigarettes, e-cigarettes, and chewing tobacco. If you need help quitting, ask your health care provider.  If you are sexually active, practice safe sex. Use a condom or other form of protection in order to prevent STIs (sexually transmitted infections).  Talk with your health care provider about taking a low-dose aspirin or statin. What's next?  Go to your health care provider once a year for a well check visit.  Ask your health care provider how often you should have your eyes and teeth checked.  Stay up to date on all vaccines. This information is not intended to replace advice given to you  by your health care provider. Make sure you discuss any questions you have with your health care provider. Document Released: 05/31/2015 Document Revised: 04/28/2018 Document Reviewed: 04/28/2018 Elsevier Patient Education  2020 [employer].

## 2018-12-27 NOTE — Progress Notes (Signed)
Patient presents to clinic today for annual exam.  Patient is fasting for labs.  Acute Concerns: Patient notes an itchy, raised lesion of her back that has been present for a long time. Would like looked at. Is non-painful.  Chronic Issues: Diabetes Mellitus II with Hypertension and Diabetic Neuropathy. Patient is currently on a regimen of Lantus 25 units QPM, Metformin 1000 mg BID, Lisinopril 20 mg QD, Amlodipine 5 mg QD, Carvedilol 3.125 mg BID and Furosemide 40 mg QD . Endorses taking medications as directed. Notes diet is not the best. Endorses fating glucose averaging around 100 each morning. Overdue for repeat A1C. Endorses eye examination is up-to-date. Followed by Dr. Madelin Headings. Will get records. Coming due for foot examination.   Health Maintenance: Immunizations -- UTD. Will get flu shot in fall. Colon Cancer Screening -- Due. Will order Cologuard. Mammogram -- UTD Bone Density -- + Osteopenia on last DEXA in 2016. Due for repeat Dexa scan. Agrees to screen.   Past Medical History:  Diagnosis Date  . AK (actinic keratosis)   . Anxiety    occasional  . Breast cancer ([place])    R breast, s/p radiation 34Gy/13fx 04/29/10-05/05/10  . CTS (carpal tunnel syndrome)    Left  . Diabetes ([place])   . Diastolic CHF ([place])    Dx w/ hospitalization 06/2015 for respiratory failure  . Facial asymmetry, acquired 08/20/2016   From injury  . Falls frequently    "can not pick left leg up"  . Foot fracture   . Hearing loss    bilateral hearing aides  . History of environmental allergies   . History of kidney stones 20 years ago  . Hypertension   . MDD (major depressive disorder)   . No blood products ([place])  09/11/2013  . Osteoarthritis    hx shoulder, knee, back, wrist pain; saw Dr. Wynelle Link   . Personal history of radiation therapy   . RESTLESS LEGS SYNDROME 05/05/2007   Qualifier: Diagnosis of  By: Gwenette Greet MD, Armando Reichert   . RLS (restless legs syndrome)   . Sleep apnea      Past Surgical History:  Procedure Laterality Date  . APPENDECTOMY  age 39  . BREAST LUMPECTOMY Right 2012  . BREAST SURGERY  2012   rt breast lumpectomy  . flex signoidoscopy    . INCISION AND DRAINAGE PERIRECTAL ABSCESS    . JOINT REPLACEMENT Right 2006   knee  . TOTAL KNEE ARTHROPLASTY Left 08/06/2014   Procedure: LEFT TOTAL KNEE ARTHROPLASTY;  Surgeon: Gaynelle Arabian, MD;  Location: [facility];  Service: Orthopedics;  Laterality: Left;  . TUBAL LIGATION  1979    Current Outpatient Medications on File Prior to Visit  Medication Sig Dispense Refill  . ACCU-CHEK FASTCLIX LANCETS MISC Use as directed to check blood sugar 100 each 3  . ACCU-CHEK GUIDE test strip USE TO CHECK GLUCOSE THREE TIMES DAILY 100 each 12  . amLODipine (NORVASC) 5 MG tablet Take 1 tablet (5 mg total) by mouth daily. 90 tablet 1  . anastrozole (ARIMIDEX) 1 MG tablet Take 1 tablet by mouth once daily 30 tablet 0  . carvedilol (COREG) 3.125 MG tablet TAKE 1 TABLET BY MOUTH TWICE DAILY WITH MEALS 60 tablet 0  . carvedilol (COREG) 3.125 MG tablet Take 1 tablet (3.125 mg total) by mouth 2 (two) times daily with a meal. 30 tablet 0  . furosemide (LASIX) 40 MG tablet Take 1 tablet (40 mg total) by mouth daily. [place]  tablet 1  . Insulin Glargine (LANTUS SOLOSTAR) 100 UNIT/ML Solostar Pen INJECT 25 UNITS UNDER THE SKIN ONCE A DAY AT 10PM 15 mL 1  . Insulin Pen Needle (BD PEN NEEDLE NANO U/F) 32G X 4 MM MISC USE AS DIRECTED ONCE DAILY 100 each 0  . lisinopril (ZESTRIL) 20 MG tablet Take 1 tablet (20 mg total) by mouth daily. 90 tablet 1  . metFORMIN (GLUCOPHAGE) 1000 MG tablet Take 1 tablet (1,000 mg total) by mouth 2 (two) times daily. 180 tablet 1  . methocarbamol (ROBAXIN) 500 MG tablet Take 500 mg by mouth every 6 (six) hours as needed for muscle spasms. Reported on 09/25/2015    . pramipexole (MIRAPEX) 1 MG tablet Take 1 tablet (1 mg total) by mouth 2 (two) times daily. 180 tablet 1   No current facility-administered medications  on file prior to visit.     Allergies  Allergen Reactions  . Sulfa Antibiotics Swelling    Other reaction(s): Facial Swelling "swelling lips"   . Sulfonamide Derivatives Swelling    Lips Swelling  . Other     BLOOD PRODUCT REFUSAL    Family History  Problem Relation Age of Onset  . Alcohol abuse Father   . Cancer Sister        breast  . Heart disease Paternal Grandfather   . Diabetes Other   . Obesity Other   . Heart disease Brother        Valve    Social History   Socioeconomic History  . Marital status: Married    Spouse name: Not on file  . Number of children: 3  . Years of education: Not on file  . Highest education level: Not on file  Occupational History  . Not on file  Social Needs  . Financial resource strain: Not on file  . Food insecurity    Worry: Not on file    Inability: Not on file  . Transportation needs    Medical: Not on file    Non-medical: Not on file  Tobacco Use  . Smoking status: Never Smoker  . Smokeless tobacco: Never Used  Substance and Sexual Activity  . Alcohol use: No  . Drug use: No  . Sexual activity: Yes  Lifestyle  . Physical activity    Days per week: Not on file    Minutes per session: Not on file  . Stress: Not on file  Relationships  . Social Herbalist on phone: Not on file    Gets together: Not on file    Attends religious service: Not on file    Active member of club or organization: Not on file    Attends meetings of clubs or organizations: Not on file    Relationship status: Not on file  . Intimate partner violence    Fear of current or ex partner: Not on file    Emotionally abused: Not on file    Physically abused: Not on file    Forced sexual activity: Not on file  Other Topics Concern  . Not on file  Social History Narrative         Home Situation: lives with husband and granddaughter      Spiritual Beliefs: Jehovah Witness - no blood products               Review of Systems   Constitutional: Negative for chills, fever, malaise/fatigue and weight loss.  HENT: Negative for ear discharge, ear pain and  nosebleeds.   Eyes: Positive for blurred vision (chronic -- cataracts BL). Negative for double vision, photophobia and pain.  Respiratory: Negative for cough, sputum production and shortness of breath.   Cardiovascular: Positive for leg swelling (chronic and mild per patient). Negative for chest pain and palpitations.  Gastrointestinal: Negative for abdominal pain, blood in stool, constipation, diarrhea, heartburn, melena, nausea and vomiting.  Genitourinary: Negative for dysuria, frequency and urgency.  Skin: Positive for itching (itchy lesion of back).  Neurological: Negative for dizziness, loss of consciousness and headaches.  Endo/Heme/Allergies: Negative for environmental allergies.  Psychiatric/Behavioral: Negative for depression, hallucinations, substance abuse and suicidal ideas. The patient is not nervous/anxious and does not have insomnia.    BP 138/80   Pulse 68   Temp 98.3 F (36.8 C) (Skin)   Resp 16   Ht 5' (1.524 m)   Wt 212 lb (96.2 kg)   SpO2 96%   BMI 41.40 kg/m   Physical Exam Vitals signs reviewed.  Constitutional:      Appearance: Normal appearance.  HENT:     Head: Normocephalic and atraumatic.     Right Ear: Tympanic membrane normal.     Left Ear: Tympanic membrane normal.     Nose: Nose normal.     Mouth/Throat:     Mouth: Mucous membranes are moist.  Eyes:     Conjunctiva/sclera: Conjunctivae normal.  Neck:     Musculoskeletal: Neck supple.  Cardiovascular:     Rate and Rhythm: Normal rate and regular rhythm.     Pulses: Normal pulses.     Heart sounds: Normal heart sounds.  Pulmonary:     Effort: Pulmonary effort is normal. No respiratory distress.     Breath sounds: Normal breath sounds. No wheezing.  Abdominal:     General: Bowel sounds are normal. There is no distension.     Palpations: Abdomen is soft.      Tenderness: There is no abdominal tenderness.  Neurological:     General: No focal deficit present.     Mental Status: She is alert and oriented to person, place, and time.  Psychiatric:        Mood and Affect: Mood normal.    Assessment/Plan: 1. Visit for preventive health examination Depression screen negative. Health Maintenance reviewed. She will return in fall for flu shot. Preventive schedule discussed and handout given in AVS. Will obtain fasting labs today.  - Hemoglobin A1c - Lipid panel - CBC with Differential/Platelet - Comprehensive metabolic panel  2. Type 2 diabetes mellitus with diabetic neuropathy, with long-term current use of insulin ([place]) Foot examination updated. Repeat labs today.  - Hemoglobin A1c - Lipid panel - Comprehensive metabolic panel  3. Essential hypertension BP stable for age. Asymptomatic. Labs today. Continue current regimen. Follow-up with Cardiology as scheduled.  - Lipid panel - Comprehensive metabolic panel  4. Chronic diastolic congestive heart failure ([place]) Euvolemic today. Labs will be updated. Continue current regimen. Follow-up with specialist as scheduled.  5. Class 3 severe obesity due to excess calories with serious comorbidity and body mass index (BMI) of 40.0 to 44.9 in adult [facility]) Will check fasting labs today. Reviewed dietary and exercise recommendations.  6. Mild episode of recurrent major depressive disorder (H[facility]) Stable. Continue current regimen.   7. Osteopenia of multiple sites Recheck Bone Density scan today.  - DG Bone Density; Future  8. Colon cancer screening Agrees to cologuard. Declines Colonoscopy. Order for Cologuard placed. - Cologuard   Leeanne Rio, [place]

## 2018-12-28 ENCOUNTER — Inpatient Hospital Stay: Payer: Medicare HMO | Attending: Hematology and Oncology | Admitting: Hematology and Oncology

## 2018-12-28 ENCOUNTER — Other Ambulatory Visit: Payer: Self-pay

## 2018-12-28 DIAGNOSIS — Z923 Personal history of irradiation: Secondary | ICD-10-CM | POA: Insufficient documentation

## 2018-12-28 DIAGNOSIS — C50411 Malignant neoplasm of upper-outer quadrant of right female breast: Secondary | ICD-10-CM | POA: Diagnosis not present

## 2018-12-28 DIAGNOSIS — Z794 Long term (current) use of insulin: Secondary | ICD-10-CM | POA: Insufficient documentation

## 2018-12-28 DIAGNOSIS — Z79899 Other long term (current) drug therapy: Secondary | ICD-10-CM | POA: Insufficient documentation

## 2018-12-28 DIAGNOSIS — Z170 Estrogen receptor positive status [ER+]: Secondary | ICD-10-CM | POA: Insufficient documentation

## 2018-12-28 DIAGNOSIS — Z79811 Long term (current) use of aromatase inhibitors: Secondary | ICD-10-CM | POA: Insufficient documentation

## 2018-12-28 DIAGNOSIS — M8580 Other specified disorders of bone density and structure, unspecified site: Secondary | ICD-10-CM | POA: Diagnosis not present

## 2018-12-31 DIAGNOSIS — R062 Wheezing: Secondary | ICD-10-CM | POA: Diagnosis not present

## 2018-12-31 DIAGNOSIS — G4733 Obstructive sleep apnea (adult) (pediatric): Secondary | ICD-10-CM | POA: Diagnosis not present

## 2018-12-31 DIAGNOSIS — I5032 Chronic diastolic (congestive) heart failure: Secondary | ICD-10-CM | POA: Diagnosis not present

## 2019-01-16 ENCOUNTER — Other Ambulatory Visit: Payer: Self-pay | Admitting: Family Medicine

## 2019-01-20 ENCOUNTER — Other Ambulatory Visit: Payer: Self-pay | Admitting: Family Medicine

## 2019-01-24 ENCOUNTER — Encounter: Payer: Self-pay | Admitting: Physician Assistant

## 2019-01-24 ENCOUNTER — Ambulatory Visit (INDEPENDENT_AMBULATORY_CARE_PROVIDER_SITE_OTHER): Payer: Medicare HMO | Admitting: Physician Assistant

## 2019-01-24 ENCOUNTER — Other Ambulatory Visit: Payer: Self-pay

## 2019-01-24 VITALS — BP 120/70 | HR 60 | Temp 95.4°F | Resp 16 | Ht 60.0 in | Wt 210.0 lb

## 2019-01-24 DIAGNOSIS — E1140 Type 2 diabetes mellitus with diabetic neuropathy, unspecified: Secondary | ICD-10-CM | POA: Diagnosis not present

## 2019-01-24 DIAGNOSIS — Z794 Long term (current) use of insulin: Secondary | ICD-10-CM | POA: Diagnosis not present

## 2019-01-24 DIAGNOSIS — Z23 Encounter for immunization: Secondary | ICD-10-CM

## 2019-01-24 MED ORDER — ACCU-CHEK FASTCLIX LANCETS MISC: 6 refills | Status: DC

## 2019-01-24 MED ORDER — BD PEN NEEDLE NANO U/F 32G X 4 MM MISC: 6 refills | Status: DC

## 2019-01-24 MED ORDER — ACCU-CHEK GUIDE VI STRP: ORAL_STRIP | 12 refills | Status: DC

## 2019-01-24 NOTE — Patient Instructions (Signed)
Please continue with your chronic medication regimen, including the Gabapentin.  I am glad symptoms are improved.  We will follow-up in Mod November for repeat assessment and blood work.  Your flu shot was updated today.  Please talk with your pharmacist about batteries for your glucometer.

## 2019-01-24 NOTE — Progress Notes (Signed)
Patient presents to clinic today for follow-up of diabetic neuropathy after being started on Gabapentin 100 mg BID. Patient endorses taking medication as directed. Is tolerating well without noted side effects. Has noted good improvement thus far with the pressure and burning in legs resolving. Is ambulating better.  Patient agrees to flu shot today.   Past Medical History:  Diagnosis Date  . AK (actinic keratosis)   . Anxiety    occasional  . Breast cancer ([place])    R breast, s/p radiation 34Gy/68fx 04/29/10-05/05/10  . CTS (carpal tunnel syndrome)    Left  . Diabetes ([place])   . Diastolic CHF ([place])    Dx w/ hospitalization 06/2015 for respiratory failure  . Facial asymmetry, acquired 08/20/2016   From injury  . Falls frequently    "can not pick left leg up"  . Foot fracture   . Hearing loss    bilateral hearing aides  . History of environmental allergies   . History of kidney stones 20 years ago  . Hypertension   . MDD (major depressive disorder)   . No blood products ([place])  09/11/2013  . Osteoarthritis    hx shoulder, knee, back, wrist pain; saw Dr. Wynelle Link   . Personal history of radiation therapy   . RESTLESS LEGS SYNDROME 05/05/2007   Qualifier: Diagnosis of  By: Gwenette Greet MD, Armando Reichert   . RLS (restless legs syndrome)   . Sleep apnea     Current Outpatient Medications on File Prior to Visit  Medication Sig Dispense Refill  . amLODipine (NORVASC) 5 MG tablet Take 1 tablet (5 mg total) by mouth daily. 90 tablet 1  . carvedilol (COREG) 3.125 MG tablet TAKE 1 TABLET BY MOUTH TWICE DAILY WITH MEALS 60 tablet 0  . carvedilol (COREG) 3.125 MG tablet Take 1 tablet (3.125 mg total) by mouth 2 (two) times daily with a meal. 30 tablet 0  . furosemide (LASIX) 40 MG tablet Take 1 tablet (40 mg total) by mouth daily. 90 tablet 1  . gabapentin (NEURONTIN) 100 MG capsule Take 1 capsule (100 mg total) by mouth 2 (two) times daily. 60 capsule 3  . Insulin Glargine (LANTUS  SOLOSTAR) 100 UNIT/ML Solostar Pen INJECT 25 UNITS UNDER THE SKIN ONCE A DAY AT 10PM 15 mL 1  . lisinopril (ZESTRIL) 20 MG tablet Take 1 tablet (20 mg total) by mouth daily. 90 tablet 1  . metFORMIN (GLUCOPHAGE) 1000 MG tablet Take 1 tablet (1,000 mg total) by mouth 2 (two) times daily. 180 tablet 1  . methocarbamol (ROBAXIN) 500 MG tablet Take 500 mg by mouth every 6 (six) hours as needed for muscle spasms. Reported on 09/25/2015    . pramipexole (MIRAPEX) 1 MG tablet Take 1 tablet (1 mg total) by mouth 2 (two) times daily. 180 tablet 1   No current facility-administered medications on file prior to visit.     Allergies  Allergen Reactions  . Sulfa Antibiotics Swelling    Other reaction(s): Facial Swelling "swelling lips"   . Sulfonamide Derivatives Swelling    Lips Swelling  . Other     BLOOD PRODUCT REFUSAL    Family History  Problem Relation Age of Onset  . Alcohol abuse Father   . Cancer Sister        breast  . Heart disease Paternal Grandfather   . Diabetes Other   . Obesity Other   . Heart disease Brother        Valve    Social  History   Socioeconomic History  . Marital status: Married    Spouse name: Not on file  . Number of children: 3  . Years of education: Not on file  . Highest education level: Not on file  Occupational History  . Not on file  Social Needs  . Financial resource strain: Not on file  . Food insecurity    Worry: Not on file    Inability: Not on file  . Transportation needs    Medical: Not on file    Non-medical: Not on file  Tobacco Use  . Smoking status: Never Smoker  . Smokeless tobacco: Never Used  Substance and Sexual Activity  . Alcohol use: No  . Drug use: No  . Sexual activity: Yes  Lifestyle  . Physical activity    Days per week: Not on file    Minutes per session: Not on file  . Stress: Not on file  Relationships  . Social Herbalist on phone: Not on file    Gets together: Not on file    Attends religious  service: Not on file    Active member of club or organization: Not on file    Attends meetings of clubs or organizations: Not on file    Relationship status: Not on file  Other Topics Concern  . Not on file  Social History Narrative         Home Situation: lives with husband and granddaughter      Spiritual Beliefs: Jehovah Witness - no blood products               Review of Systems - See HPI.  All other ROS are negative.  BP 120/70   Pulse 60   Temp (!) 95.4 F (35.2 C) (Skin)   Resp 16   Ht 5' (1.524 m)   Wt 210 lb (95.3 kg)   SpO2 92%   BMI 41.01 kg/m   Physical Exam Vitals signs reviewed.  Constitutional:      Appearance: Normal appearance.  HENT:     Head: Normocephalic and atraumatic.  Neck:     Musculoskeletal: Neck supple.  Cardiovascular:     Rate and Rhythm: Normal rate and regular rhythm.     Heart sounds: Normal heart sounds.  Pulmonary:     Effort: Pulmonary effort is normal.  Neurological:     Mental Status: She is alert.  Psychiatric:        Mood and Affect: Mood normal.     Recent Results (from the past 2160 hour(s))  HM DIABETES EYE EXAM     Status: None   Collection Time: 12/20/18 12:00 AM  Result Value Ref Range   HM Diabetic Eye Exam No Retinopathy No Retinopathy  Hemoglobin A1c     Status: Abnormal   Collection Time: 12/27/18 10:37 AM  Result Value Ref Range   Hgb A1c MFr Bld 8.1 (H) 4.6 - 6.5 %    Comment: Glycemic Control Guidelines for People with Diabetes:Non Diabetic:  <6%Goal of Therapy: <7%Additional Action Suggested:  >8%   Lipid panel     Status: Abnormal   Collection Time: 12/27/18 10:37 AM  Result Value Ref Range   Cholesterol 136 0 - 200 mg/dL    Comment: ATP III Classification       Desirable:  < 200 mg/dL               Borderline High:  200 - 239 mg/dL  High:  > = 240 mg/dL   Triglycerides 113.0 0.0 - 149.0 mg/dL    Comment: Normal:  <150 mg/dLBorderline High:  150 - 199 mg/dL   HDL 34.30 (L) >39.00 mg/dL    VLDL 22.6 0.0 - 40.0 mg/dL   LDL Cholesterol 79 0 - 99 mg/dL   Total CHOL/HDL Ratio 4     Comment:                Men          Women1/2 Average Risk     3.4          3.3Average Risk          5.0          4.42X Average Risk          9.6          7.13X Average Risk          15.0          11.0                       NonHDL 101.64     Comment: NOTE:  Non-HDL goal should be 30 mg/dL higher than patient's LDL goal (i.e. LDL goal of < 70 mg/dL, would have non-HDL goal of < 100 mg/dL)  CBC with Differential/Platelet     Status: None   Collection Time: 12/27/18 10:37 AM  Result Value Ref Range   WBC 9.0 4.0 - 10.5 K/uL   RBC 4.45 3.87 - 5.11 Mil/uL   Hemoglobin 14.0 12.0 - 15.0 g/dL   HCT 41.6 36.0 - 46.0 %   MCV 93.5 78.0 - 100.0 fl   MCHC 33.7 30.0 - 36.0 g/dL   RDW 13.7 11.5 - 15.5 %   Platelets 242.0 150.0 - 400.0 K/uL   Neutrophils Relative % 72.9 43.0 - 77.0 %   Lymphocytes Relative 16.8 12.0 - 46.0 %   Monocytes Relative 7.3 3.0 - 12.0 %   Eosinophils Relative 2.6 0.0 - 5.0 %   Basophils Relative 0.4 0.0 - 3.0 %   Neutro Abs 6.6 1.4 - 7.7 K/uL   Lymphs Abs 1.5 0.7 - 4.0 K/uL   Monocytes Absolute 0.7 0.1 - 1.0 K/uL   Eosinophils Absolute 0.2 0.0 - 0.7 K/uL   Basophils Absolute 0.0 0.0 - 0.1 K/uL  Comprehensive metabolic panel     Status: Abnormal   Collection Time: 12/27/18 10:37 AM  Result Value Ref Range   Sodium 136 135 - 145 mEq/L   Potassium 3.9 3.5 - 5.1 mEq/L   Chloride 97 96 - 112 mEq/L   CO2 32 19 - 32 mEq/L   Glucose, Bld 128 (H) 70 - 99 mg/dL   BUN 17 6 - 23 mg/dL   Creatinine, Ser 0.58 0.40 - 1.20 mg/dL   Total Bilirubin 0.5 0.2 - 1.2 mg/dL   Alkaline Phosphatase 104 39 - 117 U/L   AST 25 0 - 37 U/L   ALT 21 0 - 35 U/L   Total Protein 7.2 6.0 - 8.3 g/dL   Albumin 4.1 3.5 - 5.2 g/dL   Calcium 9.3 8.4 - 10.5 mg/dL   GFR 100.09 >60.00 mL/min    Assessment/Plan: 1. Type 2 diabetes mellitus with diabetic neuropathy, with long-term current use of insulin ([place]) Much  improved with current regimen. Continue Gabapentin as directed. Continue current medications and home glucose monitoring. Supplies refilled. Diabetic follow-up scheduled for mid-November. Will update labs at that  time - glucose blood (ACCU-CHEK GUIDE) test strip; Check blood sugars twice daily for diabetes. Dx:E11.9  Dispense: 100 each; Refill: 12 - Accu-Chek FastClix Lancets MISC; Use as directed to check blood sugar  Dispense: 100 each; Refill: 6 - Insulin Pen Needle (BD PEN NEEDLE NANO U/F) 32G X 4 MM MISC; USE AS DIRECTED ONCE DAILY  Dispense: 100 each; Refill: 6   Leeanne Rio, [place]

## 2019-01-28 ENCOUNTER — Other Ambulatory Visit: Payer: Self-pay | Admitting: Hematology and Oncology

## 2019-01-31 DIAGNOSIS — I5032 Chronic diastolic (congestive) heart failure: Secondary | ICD-10-CM | POA: Diagnosis not present

## 2019-01-31 DIAGNOSIS — R062 Wheezing: Secondary | ICD-10-CM | POA: Diagnosis not present

## 2019-01-31 DIAGNOSIS — G4733 Obstructive sleep apnea (adult) (pediatric): Secondary | ICD-10-CM | POA: Diagnosis not present

## 2019-02-06 ENCOUNTER — Other Ambulatory Visit: Payer: Self-pay | Admitting: Physician Assistant

## 2019-03-02 DIAGNOSIS — G4733 Obstructive sleep apnea (adult) (pediatric): Secondary | ICD-10-CM | POA: Diagnosis not present

## 2019-03-02 DIAGNOSIS — R062 Wheezing: Secondary | ICD-10-CM | POA: Diagnosis not present

## 2019-03-02 DIAGNOSIS — I5032 Chronic diastolic (congestive) heart failure: Secondary | ICD-10-CM | POA: Diagnosis not present

## 2019-03-06 ENCOUNTER — Other Ambulatory Visit: Payer: Self-pay | Admitting: Cardiology

## 2019-03-06 DIAGNOSIS — I152 Hypertension secondary to endocrine disorders: Secondary | ICD-10-CM

## 2019-03-06 DIAGNOSIS — E1159 Type 2 diabetes mellitus with other circulatory complications: Secondary | ICD-10-CM

## 2019-03-24 ENCOUNTER — Other Ambulatory Visit: Payer: Self-pay | Admitting: Cardiology

## 2019-03-24 ENCOUNTER — Other Ambulatory Visit: Payer: Self-pay | Admitting: Physician Assistant

## 2019-03-24 DIAGNOSIS — I152 Hypertension secondary to endocrine disorders: Secondary | ICD-10-CM

## 2019-03-24 DIAGNOSIS — E1140 Type 2 diabetes mellitus with diabetic neuropathy, unspecified: Secondary | ICD-10-CM

## 2019-03-24 DIAGNOSIS — Z794 Long term (current) use of insulin: Secondary | ICD-10-CM

## 2019-03-24 DIAGNOSIS — E1159 Type 2 diabetes mellitus with other circulatory complications: Secondary | ICD-10-CM

## 2019-03-24 NOTE — Telephone Encounter (Signed)
Rx(s) sent to pharmacy electronically.  

## 2019-03-27 ENCOUNTER — Telehealth: Payer: Self-pay | Admitting: Physician Assistant

## 2019-03-27 NOTE — Telephone Encounter (Signed)
Receive letter from [employer] that patient has Cologuard kit but still has not completed it. Please remind patient to complete and send in.

## 2019-03-30 ENCOUNTER — Other Ambulatory Visit: Payer: Self-pay | Admitting: Physician Assistant

## 2019-03-30 ENCOUNTER — Other Ambulatory Visit: Payer: Self-pay | Admitting: Family Medicine

## 2019-03-30 DIAGNOSIS — I10 Essential (primary) hypertension: Secondary | ICD-10-CM

## 2019-03-30 DIAGNOSIS — E1159 Type 2 diabetes mellitus with other circulatory complications: Secondary | ICD-10-CM

## 2019-03-30 DIAGNOSIS — I152 Hypertension secondary to endocrine disorders: Secondary | ICD-10-CM

## 2019-03-30 MED ORDER — PRAMIPEXOLE DIHYDROCHLORIDE 1 MG PO TABS: 1.0000 mg | ORAL_TABLET | Freq: Two times a day (BID) | ORAL | 0 refills | Status: DC

## 2019-03-30 NOTE — Telephone Encounter (Signed)
Copied from [place] [phone]. Topic: Quick Communication - Rx Refill/Question >> Mar 30, 2019  1:58 PM Izola Price, [place] A wrote: Medication: pramipexole (MIRAPEX) 1 MG tablet (Pharmacy has asked that medication be sent over today due to patient being out of medication for 3 days and is beginning to have symptoms  Has the patient contacted their pharmacy? {Yes (Agent: If no, request that the patient contact the pharmacy for the refill.) (Agent: If yes, when and what did the pharmacy advise?)Contact PCP  Preferred Pharmacy (with phone number or street name): [facility], [place] [address] [phone] (Phone) [phone] (Fax)    Agent: Please be advised that RX refills may take up to 3 business days. We ask that you follow-up with your pharmacy.

## 2019-04-02 DIAGNOSIS — R062 Wheezing: Secondary | ICD-10-CM | POA: Diagnosis not present

## 2019-04-02 DIAGNOSIS — I5032 Chronic diastolic (congestive) heart failure: Secondary | ICD-10-CM | POA: Diagnosis not present

## 2019-04-02 DIAGNOSIS — G4733 Obstructive sleep apnea (adult) (pediatric): Secondary | ICD-10-CM | POA: Diagnosis not present

## 2019-04-07 ENCOUNTER — Other Ambulatory Visit: Payer: Medicare HMO

## 2019-04-10 ENCOUNTER — Other Ambulatory Visit: Payer: Self-pay | Admitting: Physician Assistant

## 2019-04-10 ENCOUNTER — Encounter: Payer: Self-pay | Admitting: Physician Assistant

## 2019-04-10 ENCOUNTER — Ambulatory Visit (INDEPENDENT_AMBULATORY_CARE_PROVIDER_SITE_OTHER): Payer: Medicare HMO | Admitting: Physician Assistant

## 2019-04-10 ENCOUNTER — Other Ambulatory Visit: Payer: Self-pay

## 2019-04-10 VITALS — BP 120/80 | HR 73 | Temp 98.3°F | Resp 16 | Ht 60.0 in | Wt 211.0 lb

## 2019-04-10 DIAGNOSIS — Z794 Long term (current) use of insulin: Secondary | ICD-10-CM | POA: Diagnosis not present

## 2019-04-10 DIAGNOSIS — E1140 Type 2 diabetes mellitus with diabetic neuropathy, unspecified: Secondary | ICD-10-CM

## 2019-04-10 LAB — BASIC METABOLIC PANEL
BUN: 16 mg/dL (ref 6–23)
CO2: 34 mEq/L — ABNORMAL HIGH (ref 19–32)
Calcium: 8.7 mg/dL (ref 8.4–10.5)
Chloride: 98 mEq/L (ref 96–112)
Creatinine, Ser: 0.62 mg/dL (ref 0.40–1.20)
GFR: 92.61 mL/min (ref >60.00)
Glucose, Bld: 130 mg/dL — ABNORMAL HIGH (ref 70–99)
Potassium: 3.4 mEq/L — ABNORMAL LOW (ref 3.5–5.1)
Sodium: 142 mEq/L (ref 135–145)

## 2019-04-10 LAB — HEMOGLOBIN A1C: Hgb A1c MFr Bld: 7.7 % — ABNORMAL HIGH (ref 4.6–6.5)

## 2019-04-10 MED ORDER — ACCU-CHEK AVIVA PLUS W/DEVICE KIT: PACK | 0 refills | Status: DC

## 2019-04-10 NOTE — Progress Notes (Signed)
History of Present Illness: Patient is a 78 y.o. female who presents to clinic today for follow-up of Diabetes Mellitus II, Uncontrolled with neuropathy.  Patient currently on medication regimen of Metformin 1000 mg BID, Lantus 25mg  QPM, Lisinopril 20 mg QD.  Endorses taking medications as directed. Endorses not checking her glucose since her glucometer battery is dead. Would like new meter if possible due to age of old meter. Is also taking Gabapentin for diabetic neuropathy with good results.  Denies nighttime awakenings from symptoms. Eye exam and foot exam UTD.    Latest Maintenance: A1C --  Lab Results  Component Value Date   HGBA1C 8.1 (H) 12/27/2018    Past Medical History:  Diagnosis Date  . AK (actinic keratosis)   . Anxiety    occasional  . Breast cancer ([place])    R breast, s/p radiation 34Gy/81fx 04/29/10-05/05/10  . CTS (carpal tunnel syndrome)    Left  . Diabetes ([place])   . Diastolic CHF ([place])    Dx w/ hospitalization 06/2015 for respiratory failure  . Facial asymmetry, acquired 08/20/2016   From injury  . Falls frequently    "can not pick left leg up"  . Foot fracture   . Hearing loss    bilateral hearing aides  . History of environmental allergies   . History of kidney stones 20 years ago  . Hypertension   . MDD (major depressive disorder)   . No blood products ([place])  09/11/2013  . Osteoarthritis    hx shoulder, knee, back, wrist pain; saw Dr. Wynelle Link   . Personal history of radiation therapy   . RESTLESS LEGS SYNDROME 05/05/2007   Qualifier: Diagnosis of  By: Gwenette Greet MD, Armando Reichert   . RLS (restless legs syndrome)   . Sleep apnea     Current Outpatient Medications on File Prior to Visit  Medication Sig Dispense Refill  . Accu-Chek FastClix Lancets MISC Use as directed to check blood sugar 100 each 6  . amLODipine (NORVASC) 5 MG tablet Take 1 tablet (5 mg total) by mouth daily. 90 tablet 1  . carvedilol (COREG) 3.125 MG tablet TAKE 1 TABLET BY MOUTH  TWICE DAILY WITH A MEAL 60 tablet 3  . furosemide (LASIX) 40 MG tablet Take 1 tablet (40 mg total) by mouth daily. 90 tablet 1  . gabapentin (NEURONTIN) 100 MG capsule Take 1 capsule (100 mg total) by mouth 2 (two) times daily. 60 capsule 3  . glucose blood (ACCU-CHEK GUIDE) test strip Check blood sugars twice daily for diabetes. Dx:E11.9 100 each 12  . Insulin Glargine (LANTUS SOLOSTAR) 100 UNIT/ML Solostar Pen INJECT 25 UNITS UNDER THE SKIN ONCE A DAY AT 10PM 15 mL 1  . Insulin Pen Needle (BD PEN NEEDLE NANO U/F) 32G X 4 MM MISC USE AS DIRECTED ONCE DAILY 100 each 6  . lisinopril (ZESTRIL) 20 MG tablet Take 1 tablet (20 mg total) by mouth daily. NEEDS APPOINTMENT FOR FUTURE REFILLS 90 tablet 0  . metFORMIN (GLUCOPHAGE) 1000 MG tablet Take 1 tablet by mouth twice daily 180 tablet 0  . methocarbamol (ROBAXIN) 500 MG tablet Take 500 mg by mouth every 6 (six) hours as needed for muscle spasms. Reported on 09/25/2015    . pramipexole (MIRAPEX) 1 MG tablet Take 1 tablet (1 mg total) by mouth 2 (two) times daily. 180 tablet 0   No current facility-administered medications on file prior to visit.     Allergies  Allergen Reactions  . Sulfa Antibiotics Swelling  Other reaction(s): Facial Swelling "swelling lips"   . Sulfonamide Derivatives Swelling    Lips Swelling  . Other     BLOOD PRODUCT REFUSAL    Family History  Problem Relation Age of Onset  . Alcohol abuse Father   . Cancer Sister        breast  . Heart disease Paternal Grandfather   . Diabetes Other   . Obesity Other   . Heart disease Brother        Valve    Social History   Socioeconomic History  . Marital status: Married    Spouse name: Not on file  . Number of children: 3  . Years of education: Not on file  . Highest education level: Not on file  Occupational History  . Not on file  Social Needs  . Financial resource strain: Not on file  . Food insecurity    Worry: Not on file    Inability: Not on file  .  Transportation needs    Medical: Not on file    Non-medical: Not on file  Tobacco Use  . Smoking status: Never Smoker  . Smokeless tobacco: Never Used  Substance and Sexual Activity  . Alcohol use: No  . Drug use: No  . Sexual activity: Yes  Lifestyle  . Physical activity    Days per week: Not on file    Minutes per session: Not on file  . Stress: Not on file  Relationships  . Social Herbalist on phone: Not on file    Gets together: Not on file    Attends religious service: Not on file    Active member of club or organization: Not on file    Attends meetings of clubs or organizations: Not on file    Relationship status: Not on file  Other Topics Concern  . Not on file  Social History Narrative         Home Situation: lives with husband and granddaughter      Spiritual Beliefs: [employer] Witness - no blood products               Review of Systems: Pertinent ROS are listed in HPI  Physical Examination: BP 120/80   Pulse 73   Temp 98.3 F (36.8 C) (Temporal)   Resp 16   Ht 5' (1.524 m)   Wt 211 lb (95.7 kg)   SpO2 93%   BMI 41.21 kg/m  General appearance: alert, cooperative, appears stated age and no distress Head: Normocephalic, without obvious abnormality, atraumatic Lungs: clear to auscultation bilaterally Heart: regular rate and rhythm, S1, S2 normal, no murmur, click, rub or gallop Extremities: extremities normal, atraumatic, no cyanosis or edema Neurologic: Alert and oriented X 3, normal strength and tone. Normal symmetric reflexes. Normal coordination and gait  Diabetic Foot Exam - Simple   No data filed       Assessment/Plan: 1. Type 2 diabetes mellitus with diabetic neuropathy, with long-term current use of insulin ([place]) Rx new glucometer sent in for patient. She is to restart checks BID and record. Neuropathy well-controlled with Gabapentin. Tolerating without side effect. BP normotensive and asymptomatic. Will repeat labs today to  assess glycemic control and make further adjustments/schedule follow-up based on results.  - Basic metabolic panel - Hemoglobin A1c

## 2019-04-10 NOTE — Patient Instructions (Signed)
Please go to the lab today for blood work.  I will call you with your results. We will alter treatment regimen(s) if indicated by your results.   Please continue current medication regimen. Can start a low-dose 3 mg Melatonin at night to help with sleep.   We will schedule follow-up based on lab results.

## 2019-04-19 ENCOUNTER — Other Ambulatory Visit: Payer: Self-pay | Admitting: Emergency Medicine

## 2019-04-19 DIAGNOSIS — I10 Essential (primary) hypertension: Secondary | ICD-10-CM

## 2019-04-19 DIAGNOSIS — I152 Hypertension secondary to endocrine disorders: Secondary | ICD-10-CM

## 2019-04-19 MED ORDER — AMLODIPINE BESYLATE 5 MG PO TABS: 5.0000 mg | ORAL_TABLET | Freq: Every day | ORAL | 1 refills | Status: DC

## 2019-05-02 DIAGNOSIS — G4733 Obstructive sleep apnea (adult) (pediatric): Secondary | ICD-10-CM | POA: Diagnosis not present

## 2019-05-02 DIAGNOSIS — I5032 Chronic diastolic (congestive) heart failure: Secondary | ICD-10-CM | POA: Diagnosis not present

## 2019-05-02 DIAGNOSIS — R062 Wheezing: Secondary | ICD-10-CM | POA: Diagnosis not present

## 2019-05-03 ENCOUNTER — Encounter: Payer: Self-pay | Admitting: Cardiology

## 2019-05-03 DIAGNOSIS — E876 Hypokalemia: Secondary | ICD-10-CM | POA: Insufficient documentation

## 2019-05-03 NOTE — Progress Notes (Signed)
Virtual Visit via Video Note   This visit type was conducted due to national recommendations for restrictions regarding the COVID-19 Pandemic (e.g. social distancing) in an effort to limit this patient's exposure and mitigate transmission in our community.  Due to her co-morbid illnesses, this patient is at least at moderate risk for complications without adequate follow up.  This format is felt to be most appropriate for this patient at this time.  All issues noted in this document were discussed and addressed.  A limited physical exam was performed with this format.  Please refer to the patient's chart for her consent to telehealth for [facility].   Date:  05/04/2019   ID:  [name], DOB 03-20-1941, MRN [id number]  Patient Location: Home Provider Location: Home  PCP:  Brunetta Jeans, PA-C  Cardiologist:  Minus Breeding, MD  Electrophysiologist:  None   Evaluation Performed:  Follow-Up Visit  Chief Complaint:  Foot pain  History of Present Illness:    [name] is a 78 y.o. female who I saw last year for evaluation of SOB.  She has home O2 which I prescribed previously.  This was a difficult visit.  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  She insisted on a video visit.  Her husband was able to help.  She apparently gets around slowly with a walker.  She sleeps on and off throughout the day.  She sleeps poorly at night.  There has been some lower extremity swelling.   There is been no PND or orthopnea.  She is not witnessed to have increased shortness of breath with activity above her baseline.  She is not having any new chest discomfort, neck or arm discomfort.  The patient does not have symptoms concerning for COVID-19 infection (fever, chills, cough, or new shortness of breath).    Past Medical History:  Diagnosis Date  . AK (actinic keratosis)   . Anxiety    occasional  . Breast cancer ([place])    R breast, s/p radiation 34Gy/31f  04/29/10-05/05/10  . CTS (carpal tunnel syndrome)    Left  . Diabetes ([place]   . [place]    Dx w/ hospitalization 06/2015 for respiratory failure  . Facial asymmetry, acquired 08/20/2016   From injury  . Falls frequently    "can not pick left leg up"  . Hearing loss    bilateral hearing aides  . History of environmental allergies   . History of kidney stones 20 years ago  . Hypertension   . MDD (major depressive disorder)   . No blood products ([place])  09/11/2013  . Osteoarthritis    hx shoulder, knee, back, wrist pain; saw Dr. AWynelle Link  . Personal history of radiation therapy   . RESTLESS LEGS SYNDROME 05/05/2007   Qualifier: Diagnosis of  By: CGwenette GreetMD, KArmando Reichert  . RLS (restless legs syndrome)   . Sleep apnea    Past Surgical History:  Procedure Laterality Date  . APPENDECTOMY  age 78 . BREAST LUMPECTOMY Right 2012  . BREAST SURGERY  2012   rt breast lumpectomy  . flex signoidoscopy    . INCISION AND DRAINAGE PERIRECTAL ABSCESS    . JOINT REPLACEMENT Right 2006   knee  . TOTAL KNEE ARTHROPLASTY Left 08/06/2014   Procedure: LEFT TOTAL KNEE ARTHROPLASTY;  Surgeon: FGaynelle Arabian MD;  Location: [facility];  Service: Orthopedics;  Laterality: Left;  . TUBAL  LIGATION  1979     Current Meds  Medication Sig  . Accu-Chek FastClix Lancets MISC Use as directed to check blood sugar  . amLODipine (NORVASC) 5 MG tablet Take 1 tablet (5 mg total) by mouth daily.  . Blood Glucose Monitoring Suppl (ACCU-CHEK AVIVA PLUS) w/Device KIT Use twice daily as directed to check glucose levels; Dx E11.9  . carvedilol (COREG) 3.125 MG tablet TAKE 1 TABLET BY MOUTH TWICE DAILY WITH A MEAL  . furosemide (LASIX) 40 MG tablet Take 1 tablet (40 mg total) by mouth daily.  Marland Kitchen gabapentin (NEURONTIN) 100 MG capsule Take 1 capsule (100 mg total) by mouth 2 (two) times daily.  Marland Kitchen glucose blood (ACCU-CHEK GUIDE) test strip Check blood sugars twice daily for diabetes. Dx:E11.9  . Insulin  Glargine (LANTUS SOLOSTAR) 100 UNIT/ML Solostar Pen INJECT 25 UNITS UNDER THE SKIN ONCE A DAY AT 10PM (Patient taking differently: 20 Units. INJECT 25 UNITS UNDER THE SKIN ONCE A DAY AT 10PM)  . Insulin Pen Needle (BD PEN NEEDLE NANO U/F) 32G X 4 MM MISC USE AS DIRECTED ONCE DAILY  . lisinopril (ZESTRIL) 20 MG tablet Take 1 tablet (20 mg total) by mouth daily. NEEDS APPOINTMENT FOR FUTURE REFILLS  . metFORMIN (GLUCOPHAGE) 1000 MG tablet Take 1 tablet by mouth twice daily  . pramipexole (MIRAPEX) 1 MG tablet Take 1 tablet (1 mg total) by mouth 2 (two) times daily.     Allergies:   Sulfa antibiotics, Sulfonamide derivatives, and Other   Social History   Tobacco Use  . Smoking status: Never Smoker  . Smokeless tobacco: Never Used  Substance Use Topics  . Alcohol use: No  . Drug use: No     Family Hx: The patient's family history includes Alcohol abuse in her father; Cancer in her sister; Diabetes in an other family member; Heart disease in her brother and paternal grandfather; Obesity in an other family member.  ROS:   Please see the history of present illness.     All other systems reviewed and are negative.   Prior CV studies:   The following studies were reviewed today:  Hospital records, TEE  Labs/Other Tests and Data Reviewed:    EKG:  No ECG reviewed.  Recent Labs: 12/27/2018: ALT 21; Hemoglobin 14.0; Platelets 242.0 04/10/2019: BUN 16; Creatinine, Ser 0.62; Potassium 3.4; Sodium 142   Recent Lipid Panel Lab Results  Component Value Date/Time   CHOL 136 12/27/2018 10:37 AM   TRIG 113.0 12/27/2018 10:37 AM   HDL 34.30 (L) 12/27/2018 10:37 AM   CHOLHDL 4 12/27/2018 10:37 AM   LDLCALC 79 12/27/2018 10:37 AM   LDLDIRECT 111.6 02/14/2010 02:06 PM    Wt Readings from Last 3 Encounters:  04/10/19 211 lb (95.7 kg)  01/24/19 210 lb (95.3 kg)  12/28/18 210 lb 1.6 oz (95.3 kg)     Objective:    Vital Signs:  There were no vitals taken for this visit.   VITAL  SIGNS:   GEN:  no acute distress EYES:  sclerae anicteric, EOMI - Extraocular Movements Intact NEURO:  alert and oriented x 3, no obvious focal deficit PSYCH:  normal affect  ASSESSMENT & PLAN:      CHRONIC DIASTOLIC HF:    The patient and her husband were unable to operate the camera to let me see her legs.  However, we had a long discussion about this.  We talked about 48 ounce fluid restriction.  We talked about salt restriction.  I talked about  as needed dosing of her diuretic.  Because of low potassium previously she would need to let me know if she was using increased diuretic.  However, I think her breathing is at baseline.  No change in therapy or further imaging.    HTN:    Her blood pressure has previously been okay.  She was unable to give me any results today.  Given her limitations she would not be appropriate for video visit in the future.  FATIGUE: I did discuss this with her husband.  She sleeps off and on all through the day and night.  I do notice that she is not anemic.  I suggested that they might want to have her thyroid checked at the next lab draw.  However, I suspect her sleep-wake cycle is off.  She will follow-up with her primary provider.   COVID-19 Education: The signs and symptoms of COVID-19 were discussed with the patient and how to seek care for testing (follow up with PCP or arrange E-visit).  We talked about the vaccine. The importance of social distancing was discussed today.  Time:   Today, I have spent 26 minutes with the patient with telehealth technology discussing the above problems.     Medication Adjustments/Labs and Tests Ordered: Current medicines are reviewed at length with the patient today.  Concerns regarding medicines are outlined above.   Tests Ordered: No orders of the defined types were placed in this encounter.   Medication Changes: No orders of the defined types were placed in this encounter.   Follow Up:  as  needed  Signed, Minus Breeding, MD  05/04/2019 1:01 PM     [facility]

## 2019-05-04 ENCOUNTER — Telehealth: Payer: Self-pay

## 2019-05-04 ENCOUNTER — Telehealth (INDEPENDENT_AMBULATORY_CARE_PROVIDER_SITE_OTHER): Payer: Medicare HMO | Admitting: Cardiology

## 2019-05-04 ENCOUNTER — Encounter: Payer: Self-pay | Admitting: Cardiology

## 2019-05-04 DIAGNOSIS — I10 Essential (primary) hypertension: Secondary | ICD-10-CM

## 2019-05-04 DIAGNOSIS — E785 Hyperlipidemia, unspecified: Secondary | ICD-10-CM | POA: Diagnosis not present

## 2019-05-04 DIAGNOSIS — I110 Hypertensive heart disease with heart failure: Secondary | ICD-10-CM | POA: Diagnosis not present

## 2019-05-04 DIAGNOSIS — E876 Hypokalemia: Secondary | ICD-10-CM

## 2019-05-04 DIAGNOSIS — I5032 Chronic diastolic (congestive) heart failure: Secondary | ICD-10-CM | POA: Diagnosis not present

## 2019-05-04 NOTE — Patient Instructions (Signed)
Medication Instructions:  Your physician recommends that you continue on your current medications as directed. Please refer to the Current Medication list given to you today.  *If you need a refill on your cardiac medications before your next appointment, please call your pharmacy*  Lab Work: NONE If you have labs (blood work) drawn today and your tests are completely normal, you will receive your results only by: Marland Kitchen MyChart Message (if you have MyChart) OR . A paper copy in the mail If you have any lab test that is abnormal or we need to change your treatment, we will call you to review the results.  Testing/Procedures: NONE

## 2019-05-04 NOTE — Telephone Encounter (Signed)
Letter including 12/17 After Visit Summary and any other necessary documents to be mailed to the patient's address on file.  Staff message sent to Medical Records pool to mail AVS to patient address.

## 2019-05-09 ENCOUNTER — Other Ambulatory Visit: Payer: Self-pay | Admitting: Physician Assistant

## 2019-05-09 ENCOUNTER — Telehealth: Payer: Self-pay | Admitting: Physician Assistant

## 2019-05-09 ENCOUNTER — Other Ambulatory Visit: Payer: Self-pay

## 2019-05-09 DIAGNOSIS — Z794 Long term (current) use of insulin: Secondary | ICD-10-CM

## 2019-05-09 DIAGNOSIS — E1140 Type 2 diabetes mellitus with diabetic neuropathy, unspecified: Secondary | ICD-10-CM

## 2019-05-09 DIAGNOSIS — E119 Type 2 diabetes mellitus without complications: Secondary | ICD-10-CM

## 2019-05-09 MED ORDER — LANTUS SOLOSTAR 100 UNIT/ML ~~LOC~~ SOPN: PEN_INJECTOR | SUBCUTANEOUS | 1 refills | Status: DC

## 2019-05-09 NOTE — Telephone Encounter (Signed)
I have got it approved with insurance the approval is from 05/15/2019 to 05/13/2020. Approval code is [id number]

## 2019-05-09 NOTE — Telephone Encounter (Signed)
Referral placed for Ophthalmology for diabetic eye exam

## 2019-05-09 NOTE — Telephone Encounter (Signed)
Pt has an appt at [facility] in [place]. Her appt is 05/15/2019 at 8:40am, can we enter a referral. The DX codes are Z98.41/Z98.42 and the cpt code is 99024.   Office # and Fax is [phone]

## 2019-05-10 NOTE — Telephone Encounter (Signed)
Do you need me to fax notes and information to them or is this already completed?

## 2019-05-11 ENCOUNTER — Other Ambulatory Visit: Payer: Self-pay | Admitting: Emergency Medicine

## 2019-05-11 DIAGNOSIS — Z794 Long term (current) use of insulin: Secondary | ICD-10-CM

## 2019-05-11 DIAGNOSIS — E1140 Type 2 diabetes mellitus with diabetic neuropathy, unspecified: Secondary | ICD-10-CM

## 2019-05-11 MED ORDER — ACCU-CHEK GUIDE VI STRP: ORAL_STRIP | 12 refills | Status: DC

## 2019-05-11 MED ORDER — ACCU-CHEK FASTCLIX LANCETS MISC: 6 refills | Status: DC

## 2019-05-11 MED ORDER — ACCU-CHEK GUIDE ME W/DEVICE KIT: 1.0000 | PACK | Freq: Every day | 0 refills | Status: DC

## 2019-05-11 NOTE — Telephone Encounter (Signed)
Everything is taken care of. Thanks

## 2019-05-15 ENCOUNTER — Other Ambulatory Visit: Payer: Self-pay

## 2019-05-17 ENCOUNTER — Other Ambulatory Visit: Payer: Self-pay | Admitting: Pharmacist

## 2019-05-17 MED ORDER — POTASSIUM CHLORIDE ER 10 MEQ PO TBCR: 10.0000 meq | EXTENDED_RELEASE_TABLET | Freq: Every day | ORAL | 0 refills | Status: DC

## 2019-05-22 ENCOUNTER — Other Ambulatory Visit: Payer: Self-pay

## 2019-05-22 MED ORDER — POTASSIUM CHLORIDE ER 10 MEQ PO TBCR: 10.0000 meq | EXTENDED_RELEASE_TABLET | Freq: Every day | ORAL | 0 refills | Status: DC

## 2019-06-02 DIAGNOSIS — G4733 Obstructive sleep apnea (adult) (pediatric): Secondary | ICD-10-CM | POA: Diagnosis not present

## 2019-06-02 DIAGNOSIS — R062 Wheezing: Secondary | ICD-10-CM | POA: Diagnosis not present

## 2019-06-02 DIAGNOSIS — I5032 Chronic diastolic (congestive) heart failure: Secondary | ICD-10-CM | POA: Diagnosis not present

## 2019-06-06 ENCOUNTER — Ambulatory Visit: Payer: Medicare HMO | Attending: Internal Medicine

## 2019-06-06 DIAGNOSIS — Z23 Encounter for immunization: Secondary | ICD-10-CM

## 2019-06-06 NOTE — Progress Notes (Signed)
   Covid-19 Vaccination Clinic  Name:  [name]    MRN: [id number] DOB: Dec 20, 1940  06/06/2019  [name] was observed post Covid-19 immunization for 15 minutes without incidence. She was provided with Vaccine Information Sheet and instruction to access the V-Safe system.   [name] was instructed to call 911 with any severe reactions post vaccine: Marland Kitchen Difficulty breathing  . Swelling of your face and throat  . A fast heartbeat  . A bad rash all over your body  . Dizziness and weakness    Immunizations Administered    Name Date Dose VIS Date Route   Pfizer COVID-19 Vaccine 06/06/2019  5:08 PM 0.3 mL 04/28/2019 Intramuscular   Manufacturer: [place]   Lot: [id number]   [place]: [id number]

## 2019-06-19 DIAGNOSIS — H02831 Dermatochalasis of right upper eyelid: Secondary | ICD-10-CM | POA: Diagnosis not present

## 2019-06-19 DIAGNOSIS — H25813 Combined forms of age-related cataract, bilateral: Secondary | ICD-10-CM | POA: Diagnosis not present

## 2019-06-19 DIAGNOSIS — E113293 Type 2 diabetes mellitus with mild nonproliferative diabetic retinopathy without macular edema, bilateral: Secondary | ICD-10-CM | POA: Diagnosis not present

## 2019-06-19 DIAGNOSIS — H02834 Dermatochalasis of left upper eyelid: Secondary | ICD-10-CM | POA: Diagnosis not present

## 2019-06-26 ENCOUNTER — Ambulatory Visit: Payer: Medicare HMO | Attending: Internal Medicine

## 2019-06-26 DIAGNOSIS — Z23 Encounter for immunization: Secondary | ICD-10-CM

## 2019-06-26 NOTE — Progress Notes (Signed)
   Covid-19 Vaccination Clinic  Name:  [name]    MRN: [id number] DOB: July 03, 1940  06/26/2019  [name] was observed post Covid-19 immunization for 15 minutes without incidence. She was provided with Vaccine Information Sheet and instruction to access the V-Safe system.   [name] was instructed to call 911 with any severe reactions post vaccine: Marland Kitchen Difficulty breathing  . Swelling of your face and throat  . A fast heartbeat  . A bad rash all over your body  . Dizziness and weakness    Immunizations Administered    Name Date Dose VIS Date Route   Pfizer COVID-19 Vaccine 06/26/2019  2:26 PM 0.3 mL 04/28/2019 Intramuscular   Manufacturer: [place]   Lot: [id number]   [place]: [id number]

## 2019-06-29 NOTE — Patient Instructions (Signed)
[name]  06/29/2019     @PREFPERIOPPHARMACY @   Your procedure is scheduled on  07/07/2019 .  Report to [facility] at  [email or web address].  Call this number if you have problems the morning of surgery:  [phone]   Remember:  Do not eat or drink after midnight.                       Take these medicines the morning of surgery with A SIP OF WATER  Amlodipine, carvedilol, gabapentin, lisinopril, robaxin(if needed), mirapex.    Do not wear jewelry, make-up or nail polish.  Do not wear lotions, powders, or perfumes. Please wear deodorant and brush your teeth.  Do not shave 48 hours prior to surgery.  Men may shave face and neck.  Do not bring valuables to the hospital.  [facility] is not responsible for any belongings or valuables.  Contacts, dentures or bridgework may not be worn into surgery.  Leave your suitcase in the car.  After surgery it may be brought to your room.  For patients admitted to the hospital, discharge time will be determined by your treatment team.  Patients discharged the day of surgery will not be allowed to drive home.   Name and phone number of your driver:   family Special instructions:  DO NOT smoke the morning of your surgery.  Please read over the following fact sheets that you were given. Anesthesia Post-op Instructions and Care and Recovery After Surgery       Cataract Surgery, Care After This sheet gives you information about how to care for yourself after your procedure. Your health care provider may also give you more specific instructions. If you have problems or questions, contact your health care provider. What can I expect after the procedure? After the procedure, it is common to have:  Itching.  Discomfort.  Fluid discharge.  Sensitivity to light and to touch.  Bruising in or around the eye.  Mild blurred vision. Follow these instructions at home: Eye care   Do not touch or rub your eyes.  Protect your eyes as  told by your health care provider. You may be told to wear a protective eye shield or sunglasses.  Do not put a contact lens into the affected eye or eyes until your health care provider approves.  Keep the area around your eye clean and dry: ? Avoid swimming. ? Do not allow water to hit you directly in the face while showering. ? Keep soap and shampoo out of your eyes.  Check your eye every day for signs of infection. Watch for: ? Redness, swelling, or pain. ? Fluid, blood, or pus. ? Warmth. ? A bad smell. ? Vision that is getting worse. ? Sensitivity that is getting worse. Activity  Do not drive for 24 hours if you were given a sedative during your procedure.  Avoid strenuous activities, such as playing contact sports, for as long as told by your health care provider.  Do not drive or use heavy machinery until your health care provider approves.  Do not bend or lift heavy objects. Bending increases pressure in the eye. You can walk, climb stairs, and do light household chores.  Ask your health care provider when you can return to work. If you work in a dusty environment, you may be advised to wear protective eyewear for a period of time. General instructions  Take or apply over-the-counter  and prescription medicines only as told by your health care provider. This includes eye drops.  Keep all follow-up visits as told by your health care provider. This is important. Contact a health care provider if:  You have increased bruising around your eye.  You have pain that is not helped with medicine.  You have a fever.  You have redness, swelling, or pain in your eye.  You have fluid, blood, or pus coming from your incision.  Your vision gets worse.  Your sensitivity to light gets worse. Get help right away if:  You have sudden loss of vision.  You see flashes of light or spots (floaters).  You have severe eye pain.  You develop nausea or vomiting. Summary  After  your procedure, it is common to have itching, discomfort, bruising, fluid discharge, or sensitivity to light.  Follow instructions from your health care provider about caring for your eye after the procedure.  Do not rub your eye after the procedure. You may need to wear eye protection or sunglasses. Do not wear contact lenses. Keep the area around your eye clean and dry.  Avoid activities that require a lot of effort. These include playing sports and lifting heavy objects.  Contact a health care provider if you have increased bruising, pain that does not go away, or a fever. Get help right away if you suddenly lose your vision, see flashes of light or spots, or have severe pain in the eye. This information is not intended to replace advice given to you by your health care provider. Make sure you discuss any questions you have with your health care provider. Document Revised: 02/28/2019 Document Reviewed: 11/01/2017 Elsevier Patient Education  2020 [place] After These instructions provide you with information about caring for yourself after your procedure. Your health care provider may also give you more specific instructions. Your treatment has been planned according to current medical practices, but problems sometimes occur. Call your health care provider if you have any problems or questions after your procedure. What can I expect after the procedure? After your procedure, you may:  Feel sleepy for several hours.  Feel clumsy and have poor balance for several hours.  Feel forgetful about what happened after the procedure.  Have poor judgment for several hours.  Feel nauseous or vomit.  Have a sore throat if you had a breathing tube during the procedure. Follow these instructions at home: For at least 24 hours after the procedure:      Have a responsible adult stay with you. It is important to have someone help care for you until you are  awake and alert.  Rest as needed.  Do not: ? Participate in activities in which you could fall or become injured. ? Drive. ? Use heavy machinery. ? Drink alcohol. ? Take sleeping pills or medicines that cause drowsiness. ? Make important decisions or sign legal documents. ? Take care of children on your own. Eating and drinking  Follow the diet that is recommended by your health care provider.  If you vomit, drink water, juice, or soup when you can drink without vomiting.  Make sure you have little or no nausea before eating solid foods. General instructions  Take over-the-counter and prescription medicines only as told by your health care provider.  If you have sleep apnea, surgery and certain medicines can increase your risk for breathing problems. Follow instructions from your health care provider about wearing your sleep device: ?  Anytime you are sleeping, including during daytime naps. ? While taking prescription pain medicines, sleeping medicines, or medicines that make you drowsy.  If you smoke, do not smoke without supervision.  Keep all follow-up visits as told by your health care provider. This is important. Contact a health care provider if:  You keep feeling nauseous or you keep vomiting.  You feel light-headed.  You develop a rash.  You have a fever. Get help right away if:  You have trouble breathing. Summary  For several hours after your procedure, you may feel sleepy and have poor judgment.  Have a responsible adult stay with you for at least 24 hours or until you are awake and alert. This information is not intended to replace advice given to you by your health care provider. Make sure you discuss any questions you have with your health care provider. Document Revised: 08/02/2017 Document Reviewed: 08/25/2015 Elsevier Patient Education  [place].

## 2019-06-30 NOTE — H&P (Signed)
Surgical History & Physical  Patient Name: [name] DOB: [birth year]  Surgery: Cataract extraction with intraocular lens implant phacoemulsification; Left Eye  Surgeon: Baruch Goldmann MD Surgery Date:  07/07/2019 Pre-Op Date:  06/19/2019  HPI: A 46 Yr. old female patient 1. 1. The patient complains of difficulty when viewing TV, reading closed caption, news scrolls on TV, which began 2 months ago. Both eyes are affected. The episode is gradual. The condition's severity increased since last visit. Symptoms occur when the patient is inside, outside and reading. This is negatively affecting the patient's quality of life. HPI was performed by Baruch Goldmann .  Medical History: Myopia Retinal tear without Detachment OU, Floaters, PVD Diabetes High Blood Pressure LDL Sleep apnea?  Review of Systems Negative Allergic/Immunologic Negative Cardiovascular Negative Constitutional Negative Ear, Nose, Mouth & Throat Negative Endocrine Negative Eyes Negative Gastrointestinal Negative Genitourinary Negative Hemotologic/Lymphatic Negative Integumentary Negative Musculoskeletal Negative Neurological Negative Psychiatry Negative Respiratory  Social   Never smoked   Medication Januvia, Amlodipine Besylate, Metformin, Lisinopril-HCTZ, Cancer medication,   Sx/Procedures Knee Surgery, Appendectomy,   Drug Allergies  Sulfa,   History & Physical: Heent:  Cataract, Left eye NECK: supple without bruits LUNGS: lungs clear to auscultation CV: regular rate and rhythm Abdomen: soft and non-tender  Impression & Plan: Assessment: 1.  COMBINED FORMS AGE RELATED CATARACT; Both Eyes (H25.813) 2.  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  DERMATOCHALASIS, no surgery; Right Upper Lid, Left Upper Lid (H02.831, [id number])  Plan: 1.  Cataract accounts for the patient's decreased vision. This visual impairment is not correctable with a tolerable change in glasses or contact  lenses. Cataract surgery with an implantation of a new lens should significantly improve the visual and functional status of the patient. Discussed all risks, benefits, alternatives, and potential complications. Discussed the procedures and recovery. Patient desires to have surgery. A-scan ordered and performed today for intra-ocular lens calculations. The surgery will be performed in order to improve vision for driving, reading, and for eye examinations. Recommend phacoemulsification with intra-ocular lens. Left Eye worse - first. Dilates well - shugarcaine by protocol. 2.  Stable. Recommend regular eye exams. Recommend good blood sugar and blood pressure control. 3.  Asymptomatic, recommend observation for now. Findings, prognosis and treatment options reviewed.

## 2019-07-03 DIAGNOSIS — I5032 Chronic diastolic (congestive) heart failure: Secondary | ICD-10-CM | POA: Diagnosis not present

## 2019-07-03 DIAGNOSIS — R062 Wheezing: Secondary | ICD-10-CM | POA: Diagnosis not present

## 2019-07-03 DIAGNOSIS — G4733 Obstructive sleep apnea (adult) (pediatric): Secondary | ICD-10-CM | POA: Diagnosis not present

## 2019-07-05 ENCOUNTER — Other Ambulatory Visit: Payer: Self-pay

## 2019-07-05 ENCOUNTER — Other Ambulatory Visit (HOSPITAL_COMMUNITY)
Admission: RE | Admit: 2019-07-05 | Discharge: 2019-07-05 | Disposition: A | Discharge status: Home / Self Care | Payer: Medicare HMO | Source: Ambulatory Visit | Attending: Ophthalmology | Admitting: Ophthalmology

## 2019-07-05 ENCOUNTER — Encounter (HOSPITAL_COMMUNITY): Payer: Self-pay

## 2019-07-05 ENCOUNTER — Encounter (HOSPITAL_COMMUNITY)
Admission: RE | Admit: 2019-07-05 | Discharge: 2019-07-05 | Disposition: A | Discharge status: Home / Self Care | Payer: Medicare HMO | Source: Ambulatory Visit | Attending: Ophthalmology | Admitting: Ophthalmology

## 2019-07-05 DIAGNOSIS — Z01812 Encounter for preprocedural laboratory examination: Secondary | ICD-10-CM | POA: Insufficient documentation

## 2019-07-05 DIAGNOSIS — Z20822 Contact with and (suspected) exposure to covid-19: Secondary | ICD-10-CM | POA: Insufficient documentation

## 2019-07-05 LAB — BASIC METABOLIC PANEL
Anion gap: 11 (ref 5–15)
BUN: 16 mg/dL (ref 8–23)
CO2: 32 mmol/L (ref 22–32)
Calcium: 8.7 mg/dL — ABNORMAL LOW (ref 8.9–10.3)
Chloride: 98 mmol/L (ref 98–111)
Creatinine, Ser: 0.51 mg/dL (ref 0.44–1.00)
GFR calc Af Amer: >60 mL/min (ref >60)
GFR calc non Af Amer: >60 mL/min (ref >60)
Glucose, Bld: 125 mg/dL — ABNORMAL HIGH (ref 70–99)
Potassium: 3.6 mmol/L (ref 3.5–5.1)
Sodium: 141 mmol/L (ref 135–145)

## 2019-07-05 LAB — SARS CORONAVIRUS 2 (TAT 6-24 HRS): SARS Coronavirus 2: NEGATIVE

## 2019-07-05 LAB — HEMOGLOBIN A1C
Hgb A1c MFr Bld: 7.6 % — ABNORMAL HIGH (ref 4.8–5.6)
Mean Plasma Glucose: 171.42 mg/dL

## 2019-07-05 NOTE — Progress Notes (Signed)
Patient and husband given written and verbal pre op instructions. Take only 1/2 of lantus insulin 12 units  the night before surgery

## 2019-07-07 ENCOUNTER — Ambulatory Visit (HOSPITAL_COMMUNITY): Payer: Medicare HMO | Admitting: Anesthesiology

## 2019-07-07 ENCOUNTER — Other Ambulatory Visit: Payer: Self-pay

## 2019-07-07 ENCOUNTER — Ambulatory Visit (HOSPITAL_COMMUNITY)
Admission: RE | Admit: 2019-07-07 | Discharge: 2019-07-07 | Disposition: A | Discharge status: Still In Hospital | Payer: Medicare HMO | Source: Ambulatory Visit | Attending: Ophthalmology | Admitting: Ophthalmology

## 2019-07-07 ENCOUNTER — Observation Stay (HOSPITAL_COMMUNITY): Payer: Medicare HMO

## 2019-07-07 ENCOUNTER — Encounter (HOSPITAL_COMMUNITY): Payer: Self-pay | Admitting: Emergency Medicine

## 2019-07-07 ENCOUNTER — Emergency Department (HOSPITAL_COMMUNITY): Payer: Medicare HMO

## 2019-07-07 ENCOUNTER — Inpatient Hospital Stay (HOSPITAL_COMMUNITY)
Admission: EM | Admit: 2019-07-07 | Discharge: 2019-07-11 | DRG: 291 | Disposition: A | Discharge status: Home Health | Payer: Medicare HMO | Attending: Family Medicine | Admitting: Family Medicine

## 2019-07-07 ENCOUNTER — Encounter (HOSPITAL_COMMUNITY)
Admission: RE | Disposition: A | Discharge status: Still In Hospital | Payer: Self-pay | Source: Ambulatory Visit | Attending: Ophthalmology

## 2019-07-07 ENCOUNTER — Encounter (HOSPITAL_COMMUNITY): Payer: Self-pay | Admitting: Ophthalmology

## 2019-07-07 DIAGNOSIS — B9620 Unspecified Escherichia coli [E. coli] as the cause of diseases classified elsewhere: Secondary | ICD-10-CM | POA: Diagnosis present

## 2019-07-07 DIAGNOSIS — Z79899 Other long term (current) drug therapy: Secondary | ICD-10-CM | POA: Insufficient documentation

## 2019-07-07 DIAGNOSIS — G9341 Metabolic encephalopathy: Secondary | ICD-10-CM | POA: Diagnosis present

## 2019-07-07 DIAGNOSIS — J9621 Acute and chronic respiratory failure with hypoxia: Secondary | ICD-10-CM | POA: Diagnosis present

## 2019-07-07 DIAGNOSIS — I10 Essential (primary) hypertension: Secondary | ICD-10-CM | POA: Diagnosis not present

## 2019-07-07 DIAGNOSIS — I252 Old myocardial infarction: Secondary | ICD-10-CM

## 2019-07-07 DIAGNOSIS — E669 Obesity, unspecified: Secondary | ICD-10-CM | POA: Diagnosis present

## 2019-07-07 DIAGNOSIS — I152 Hypertension secondary to endocrine disorders: Secondary | ICD-10-CM

## 2019-07-07 DIAGNOSIS — Z923 Personal history of irradiation: Secondary | ICD-10-CM

## 2019-07-07 DIAGNOSIS — F419 Anxiety disorder, unspecified: Secondary | ICD-10-CM | POA: Diagnosis present

## 2019-07-07 DIAGNOSIS — Z974 Presence of external hearing-aid: Secondary | ICD-10-CM | POA: Diagnosis not present

## 2019-07-07 DIAGNOSIS — Z853 Personal history of malignant neoplasm of breast: Secondary | ICD-10-CM

## 2019-07-07 DIAGNOSIS — H02834 Dermatochalasis of left upper eyelid: Secondary | ICD-10-CM | POA: Diagnosis present

## 2019-07-07 DIAGNOSIS — I351 Nonrheumatic aortic (valve) insufficiency: Secondary | ICD-10-CM | POA: Diagnosis not present

## 2019-07-07 DIAGNOSIS — Z9119 Patient's noncompliance with other medical treatment and regimen: Secondary | ICD-10-CM

## 2019-07-07 DIAGNOSIS — I5033 Acute on chronic diastolic (congestive) heart failure: Secondary | ICD-10-CM | POA: Diagnosis present

## 2019-07-07 DIAGNOSIS — R0603 Acute respiratory distress: Secondary | ICD-10-CM | POA: Diagnosis not present

## 2019-07-07 DIAGNOSIS — G4733 Obstructive sleep apnea (adult) (pediatric): Secondary | ICD-10-CM | POA: Diagnosis present

## 2019-07-07 DIAGNOSIS — F329 Major depressive disorder, single episode, unspecified: Secondary | ICD-10-CM | POA: Diagnosis present

## 2019-07-07 DIAGNOSIS — E1136 Type 2 diabetes mellitus with diabetic cataract: Secondary | ICD-10-CM | POA: Diagnosis present

## 2019-07-07 DIAGNOSIS — G2581 Restless legs syndrome: Secondary | ICD-10-CM | POA: Diagnosis present

## 2019-07-07 DIAGNOSIS — Z803 Family history of malignant neoplasm of breast: Secondary | ICD-10-CM

## 2019-07-07 DIAGNOSIS — Z87442 Personal history of urinary calculi: Secondary | ICD-10-CM | POA: Diagnosis not present

## 2019-07-07 DIAGNOSIS — H02831 Dermatochalasis of right upper eyelid: Secondary | ICD-10-CM | POA: Diagnosis present

## 2019-07-07 DIAGNOSIS — Z882 Allergy status to sulfonamides status: Secondary | ICD-10-CM | POA: Diagnosis not present

## 2019-07-07 DIAGNOSIS — N390 Urinary tract infection, site not specified: Secondary | ICD-10-CM | POA: Diagnosis present

## 2019-07-07 DIAGNOSIS — Z20822 Contact with and (suspected) exposure to covid-19: Secondary | ICD-10-CM | POA: Diagnosis present

## 2019-07-07 DIAGNOSIS — Z794 Long term (current) use of insulin: Secondary | ICD-10-CM | POA: Diagnosis not present

## 2019-07-07 DIAGNOSIS — J9612 Chronic respiratory failure with hypercapnia: Secondary | ICD-10-CM | POA: Diagnosis present

## 2019-07-07 DIAGNOSIS — G894 Chronic pain syndrome: Secondary | ICD-10-CM | POA: Diagnosis present

## 2019-07-07 DIAGNOSIS — W010XXA Fall on same level from slipping, tripping and stumbling without subsequent striking against object, initial encounter: Secondary | ICD-10-CM | POA: Diagnosis present

## 2019-07-07 DIAGNOSIS — I509 Heart failure, unspecified: Secondary | ICD-10-CM | POA: Diagnosis not present

## 2019-07-07 DIAGNOSIS — E1159 Type 2 diabetes mellitus with other circulatory complications: Secondary | ICD-10-CM

## 2019-07-07 DIAGNOSIS — E872 Acidosis: Secondary | ICD-10-CM | POA: Diagnosis present

## 2019-07-07 DIAGNOSIS — Z9981 Dependence on supplemental oxygen: Secondary | ICD-10-CM

## 2019-07-07 DIAGNOSIS — Z811 Family history of alcohol abuse and dependence: Secondary | ICD-10-CM

## 2019-07-07 DIAGNOSIS — R0602 Shortness of breath: Secondary | ICD-10-CM

## 2019-07-07 DIAGNOSIS — H9190 Unspecified hearing loss, unspecified ear: Secondary | ICD-10-CM | POA: Diagnosis present

## 2019-07-07 DIAGNOSIS — J9622 Acute and chronic respiratory failure with hypercapnia: Secondary | ICD-10-CM | POA: Diagnosis present

## 2019-07-07 DIAGNOSIS — I110 Hypertensive heart disease with heart failure: Principal | ICD-10-CM | POA: Diagnosis present

## 2019-07-07 DIAGNOSIS — R0902 Hypoxemia: Secondary | ICD-10-CM | POA: Diagnosis present

## 2019-07-07 DIAGNOSIS — Z833 Family history of diabetes mellitus: Secondary | ICD-10-CM

## 2019-07-07 DIAGNOSIS — Z7984 Long term (current) use of oral hypoglycemic drugs: Secondary | ICD-10-CM | POA: Insufficient documentation

## 2019-07-07 DIAGNOSIS — Z6841 Body Mass Index (BMI) 40.0 and over, adult: Secondary | ICD-10-CM | POA: Diagnosis not present

## 2019-07-07 DIAGNOSIS — E113299 Type 2 diabetes mellitus with mild nonproliferative diabetic retinopathy without macular edema, unspecified eye: Secondary | ICD-10-CM | POA: Diagnosis present

## 2019-07-07 DIAGNOSIS — E1140 Type 2 diabetes mellitus with diabetic neuropathy, unspecified: Secondary | ICD-10-CM | POA: Diagnosis present

## 2019-07-07 DIAGNOSIS — Z539 Procedure and treatment not carried out, unspecified reason: Secondary | ICD-10-CM | POA: Insufficient documentation

## 2019-07-07 DIAGNOSIS — Z9049 Acquired absence of other specified parts of digestive tract: Secondary | ICD-10-CM

## 2019-07-07 DIAGNOSIS — H259 Unspecified age-related cataract: Secondary | ICD-10-CM | POA: Diagnosis present

## 2019-07-07 DIAGNOSIS — J9602 Acute respiratory failure with hypercapnia: Secondary | ICD-10-CM | POA: Diagnosis present

## 2019-07-07 DIAGNOSIS — R296 Repeated falls: Secondary | ICD-10-CM | POA: Diagnosis present

## 2019-07-07 DIAGNOSIS — H25813 Combined forms of age-related cataract, bilateral: Secondary | ICD-10-CM | POA: Insufficient documentation

## 2019-07-07 DIAGNOSIS — J9611 Chronic respiratory failure with hypoxia: Secondary | ICD-10-CM | POA: Diagnosis present

## 2019-07-07 DIAGNOSIS — Z8249 Family history of ischemic heart disease and other diseases of the circulatory system: Secondary | ICD-10-CM

## 2019-07-07 LAB — BASIC METABOLIC PANEL
Anion gap: 10 (ref 5–15)
BUN: 18 mg/dL (ref 8–23)
CO2: 33 mmol/L — ABNORMAL HIGH (ref 22–32)
Calcium: 8.8 mg/dL — ABNORMAL LOW (ref 8.9–10.3)
Chloride: 97 mmol/L — ABNORMAL LOW (ref 98–111)
Creatinine, Ser: 0.62 mg/dL (ref 0.44–1.00)
GFR calc Af Amer: >60 mL/min (ref >60)
GFR calc non Af Amer: >60 mL/min (ref >60)
Glucose, Bld: 132 mg/dL — ABNORMAL HIGH (ref 70–99)
Potassium: 3.6 mmol/L (ref 3.5–5.1)
Sodium: 140 mmol/L (ref 135–145)

## 2019-07-07 LAB — GLUCOSE, CAPILLARY
Glucose-Capillary: 130 mg/dL — ABNORMAL HIGH (ref 70–99)
Glucose-Capillary: 229 mg/dL — ABNORMAL HIGH (ref 70–99)

## 2019-07-07 LAB — CBC WITH DIFFERENTIAL/PLATELET
Abs Immature Granulocytes: 0.03 10*3/uL (ref 0.00–0.07)
Basophils Absolute: 0 10*3/uL (ref 0.0–0.1)
Basophils Relative: 0 %
Eosinophils Absolute: 0.3 10*3/uL (ref 0.0–0.5)
Eosinophils Relative: 3 %
HCT: 45.5 % (ref 36.0–46.0)
Hemoglobin: 13.9 g/dL (ref 12.0–15.0)
Immature Granulocytes: 0 %
Lymphocytes Relative: 15 %
Lymphs Abs: 1.6 10*3/uL (ref 0.7–4.0)
MCH: 30.2 pg (ref 26.0–34.0)
MCHC: 30.5 g/dL (ref 30.0–36.0)
MCV: 98.7 fL (ref 80.0–100.0)
Monocytes Absolute: 0.7 10*3/uL (ref 0.1–1.0)
Monocytes Relative: 7 %
Neutro Abs: 7.8 10*3/uL — ABNORMAL HIGH (ref 1.7–7.7)
Neutrophils Relative %: 75 %
Platelets: 270 10*3/uL (ref 150–400)
RBC: 4.61 MIL/uL (ref 3.87–5.11)
RDW: 14.2 % (ref 11.5–15.5)
WBC: 10.4 10*3/uL (ref 4.0–10.5)
nRBC: 0 % (ref 0.0–0.2)

## 2019-07-07 LAB — TROPONIN I (HIGH SENSITIVITY)
Troponin I (High Sensitivity): 5 ng/L (ref <18)
Troponin I (High Sensitivity): 4 ng/L (ref <18)

## 2019-07-07 LAB — BRAIN NATRIURETIC PEPTIDE: B Natriuretic Peptide: 53 pg/mL (ref 0.0–100.0)

## 2019-07-07 SURGERY — PHACOEMULSIFICATION, CATARACT, WITH IOL INSERTION
Anesthesia: Monitor Anesthesia Care | Laterality: Left

## 2019-07-07 MED ORDER — LACTATED RINGERS IV SOLN: INTRAVENOUS | Status: DC

## 2019-07-07 MED ORDER — CARVEDILOL 3.125 MG PO TABS
3.1250 mg | ORAL_TABLET | Freq: Two times a day (BID) | ORAL | Status: DC
  Administered 2019-07-07 – 2019-07-11 (×8): 3.125 mg via ORAL
  Filled 2019-07-07 (×8): qty 1

## 2019-07-07 MED ORDER — PRAMIPEXOLE DIHYDROCHLORIDE 1 MG PO TABS
1.0000 mg | ORAL_TABLET | Freq: Two times a day (BID) | ORAL | Status: DC
  Administered 2019-07-07 – 2019-07-11 (×8): 1 mg via ORAL
  Filled 2019-07-07 (×8): qty 1

## 2019-07-07 MED ORDER — POLYETHYLENE GLYCOL 3350 17 G PO PACK: 17.0000 g | PACK | Freq: Every day | ORAL | Status: DC | PRN

## 2019-07-07 MED ORDER — ONDANSETRON HCL 4 MG PO TABS: 4.0000 mg | ORAL_TABLET | Freq: Four times a day (QID) | ORAL | Status: DC | PRN

## 2019-07-07 MED ORDER — TETRACAINE HCL 0.5 % OP SOLN
1.0000 [drp] | OPHTHALMIC | Status: AC | PRN
  Administered 2019-07-07 (×3): 1 [drp] via OPHTHALMIC

## 2019-07-07 MED ORDER — EPINEPHRINE PF 1 MG/ML IJ SOLN
INTRAMUSCULAR | Status: AC
  Filled 2019-07-07: qty 2

## 2019-07-07 MED ORDER — ACETAMINOPHEN 650 MG RE SUPP: 650.0000 mg | Freq: Four times a day (QID) | RECTAL | Status: DC | PRN

## 2019-07-07 MED ORDER — INSULIN GLARGINE 100 UNIT/ML ~~LOC~~ SOLN
10.0000 [IU] | Freq: Every day | SUBCUTANEOUS | Status: DC
  Administered 2019-07-08 – 2019-07-10 (×3): 10 [IU] via SUBCUTANEOUS
  Filled 2019-07-07 (×5): qty 0.1

## 2019-07-07 MED ORDER — INSULIN ASPART 100 UNIT/ML ~~LOC~~ SOLN
0.0000 [IU] | Freq: Three times a day (TID) | SUBCUTANEOUS | Status: DC
  Administered 2019-07-08 (×2): 1 [IU] via SUBCUTANEOUS
  Administered 2019-07-08: 2 [IU] via SUBCUTANEOUS
  Administered 2019-07-09: 3 [IU] via SUBCUTANEOUS
  Administered 2019-07-09 – 2019-07-10 (×2): 2 [IU] via SUBCUTANEOUS
  Administered 2019-07-10: 3 [IU] via SUBCUTANEOUS
  Administered 2019-07-10 – 2019-07-11 (×2): 1 [IU] via SUBCUTANEOUS
  Administered 2019-07-11: 3 [IU] via SUBCUTANEOUS

## 2019-07-07 MED ORDER — ENOXAPARIN SODIUM 40 MG/0.4ML ~~LOC~~ SOLN
40.0000 mg | Freq: Every day | SUBCUTANEOUS | Status: DC
  Administered 2019-07-07 – 2019-07-10 (×4): 40 mg via SUBCUTANEOUS
  Filled 2019-07-07 (×4): qty 0.4

## 2019-07-07 MED ORDER — FUROSEMIDE 10 MG/ML IJ SOLN
60.0000 mg | Freq: Once | INTRAMUSCULAR | Status: AC
  Administered 2019-07-07: 60 mg via INTRAVENOUS
  Filled 2019-07-07: qty 6

## 2019-07-07 MED ORDER — IOHEXOL 350 MG/ML SOLN
75.0000 mL | Freq: Once | INTRAVENOUS | Status: AC | PRN
  Administered 2019-07-07: 75 mL via INTRAVENOUS

## 2019-07-07 MED ORDER — GABAPENTIN 100 MG PO CAPS
100.0000 mg | ORAL_CAPSULE | Freq: Two times a day (BID) | ORAL | Status: DC
  Administered 2019-07-07 – 2019-07-08 (×2): 100 mg via ORAL
  Filled 2019-07-07 (×2): qty 1

## 2019-07-07 MED ORDER — AMLODIPINE BESYLATE 5 MG PO TABS
5.0000 mg | ORAL_TABLET | Freq: Every day | ORAL | Status: DC
  Administered 2019-07-08 – 2019-07-09 (×2): 5 mg via ORAL
  Filled 2019-07-07 (×2): qty 1

## 2019-07-07 MED ORDER — PHENYLEPHRINE HCL 2.5 % OP SOLN
1.0000 [drp] | OPHTHALMIC | Status: AC | PRN
  Administered 2019-07-07 (×3): 1 [drp] via OPHTHALMIC

## 2019-07-07 MED ORDER — LIDOCAINE HCL 3.5 % OP GEL: 1.0000 "application " | Freq: Once | OPHTHALMIC | Status: DC

## 2019-07-07 MED ORDER — CYCLOPENTOLATE-PHENYLEPHRINE 0.2-1 % OP SOLN
1.0000 [drp] | OPHTHALMIC | Status: AC | PRN
  Administered 2019-07-07 (×3): 1 [drp] via OPHTHALMIC

## 2019-07-07 MED ORDER — ONDANSETRON HCL 4 MG/2ML IJ SOLN: 4.0000 mg | Freq: Four times a day (QID) | INTRAMUSCULAR | Status: DC | PRN

## 2019-07-07 MED ORDER — ACETAMINOPHEN 325 MG PO TABS: 650.0000 mg | ORAL_TABLET | Freq: Four times a day (QID) | ORAL | Status: DC | PRN

## 2019-07-07 NOTE — ED Notes (Signed)
pts son updated per pt approval

## 2019-07-07 NOTE — ED Provider Notes (Signed)
[facility] Provider Note   CSN: [id number] Arrival date & time: 07/07/19  1129     History Chief Complaint  Patient presents with  . Respiratory Distress    [name] is a 79 y.o. female with history of diastolic CHF EF 09-47%, insulin dependent DM, and breast cancer currently in remission who presents with hypoxia. The patient states that she went to have a cataract removed today and they would not do the procedure because she was hypoxic. Sats were 81% on RA at the office and 86% on RA here. Her main complaint is that she feels hungry and is asking for something to eat. She denies fever, chills, shortness of breath, cough, wheezing, chest pain. She endorses leg swelling but it seems to be about the same. She has had some weight gain recently but states that this tends to fluctuate. She uses home O2 at night only (3.5 L). Last Echo was 2017. She takes Lasix 3m daily. Had a COVID test 2 days ago which was negative.  Cardiologist: Dr. HPercival Spanish HPI     Past Medical History:  Diagnosis Date  . AK (actinic keratosis)   . Anxiety    occasional  . Breast cancer ([place]    R breast, s/p radiation 34Gy/151f12/13/11-12/19/11  . CTS (carpal tunnel syndrome)    Left  . Diabetes ([facility]  . Diastolic CHF ([facility]   Dx w/ hospitalization 06/2015 for respiratory failure  . Facial asymmetry, acquired 08/20/2016   From injury  . Falls frequently    "can not pick left leg up"  . Hearing loss    bilateral hearing aides  . History of environmental allergies   . History of kidney stones 20 years ago  . Hypertension   . MDD (major depressive disorder)   . No blood products ([place])  09/11/2013  . Osteoarthritis    hx shoulder, knee, back, wrist pain; saw Dr. AlWynelle Link . Personal history of radiation therapy   . RESTLESS LEGS SYNDROME 05/05/2007   Qualifier: Diagnosis of  By: ClGwenette GreetD, KeArmando Reichert . RLS (restless legs syndrome)   . Sleep apnea     Patient  Active Problem List   Diagnosis Date Noted  . Hypokalemia 05/03/2019  . Facial asymmetry, acquired 08/20/2016  . Fracture of knee prosthesis ([facility]/17/2018  . Obesity 02/20/2016  . Chronic pain syndrome 02/19/2016  . Mild episode of recurrent major depressive disorder ([facility]03/17/2017  . Chronic diastolic congestive heart failure ([facility]02/28/2017  . Type 2 diabetes mellitus with diabetic neuropathy ([facility]05/04/2015  . Hearing loss - has hearing aides, follow by audiologist 10/11/2013  . No blood products ([place])  09/11/2013  . Disorder of bone and cartilage 06/10/2010  . Breast cancer of upper-outer quadrant of right female breast ([facility]11/18/2011  . RESTLESS LEGS SYNDROME 05/05/2007  . Obstructive sleep apnea 03/01/2007  . Essential hypertension 01/27/2007  . Osteoarthritis 01/27/2007    Past Surgical History:  Procedure Laterality Date  . APPENDECTOMY  age 79. BREAST LUMPECTOMY Right 2012  . BREAST SURGERY  2012   rt breast lumpectomy  . flex signoidoscopy    . INCISION AND DRAINAGE PERIRECTAL ABSCESS    . JOINT REPLACEMENT Right 2006   knee  . TOTAL KNEE ARTHROPLASTY Left 08/06/2014   Procedure: LEFT TOTAL KNEE ARTHROPLASTY;  Surgeon: FrGaynelle ArabianMD;  Location: [facility];  Service: Orthopedics;  Laterality: Left;  . TUBAL LIGATION  1979     OB History   No obstetric history on file.     Family History  Problem Relation Age of Onset  . Alcohol abuse Father   . Cancer Sister        breast  . Heart disease Paternal Grandfather   . Diabetes Other   . Obesity Other   . Heart disease Brother        Valve    Social History   Tobacco Use  . Smoking status: Never Smoker  . Smokeless tobacco: Never Used  Substance Use Topics  . Alcohol use: No  . Drug use: No    Home Medications Prior to Admission medications   Medication Sig Start Date End Date Taking? Authorizing Provider  Accu-Chek FastClix Lancets MISC Use as directed to check blood sugar  05/11/19   Brunetta Jeans, PA-C  amLODipine (NORVASC) 5 MG tablet Take 1 tablet (5 mg total) by mouth daily. 04/19/19   Brunetta Jeans, PA-C  Blood Glucose Monitoring Suppl (ACCU-CHEK GUIDE ME) w/Device KIT 1 kit by Does not apply route daily. Dx:E11.40 05/11/19   Brunetta Jeans, PA-C  carvedilol (COREG) 3.125 MG tablet TAKE 1 TABLET BY MOUTH TWICE DAILY WITH MEALS Patient taking differently: Take 3.125 mg by mouth in the morning and at bedtime.  05/09/19   Brunetta Jeans, PA-C  furosemide (LASIX) 40 MG tablet Take 1 tablet (40 mg total) by mouth daily. 12/13/18   Brunetta Jeans, PA-C  gabapentin (NEURONTIN) 100 MG capsule Take 1 capsule (100 mg total) by mouth 2 (two) times daily. 12/27/18   Brunetta Jeans, PA-C  glucose blood (ACCU-CHEK GUIDE) test strip Check blood sugars twice daily for diabetes. Dx:E11.9 05/11/19   Brunetta Jeans, PA-C  Insulin Glargine (LANTUS SOLOSTAR) 100 UNIT/ML Solostar Pen INJECT 25 UNITS UNDER THE SKIN ONCE A DAY AT 10:00PM Patient taking differently: Inject 25 Units into the skin at bedtime. INJECT 25 UNITS UNDER THE SKIN ONCE A DAY AT 10:00PM 05/09/19   Brunetta Jeans, PA-C  Insulin Pen Needle (BD PEN NEEDLE NANO U/F) 32G X 4 MM MISC USE AS DIRECTED ONCE DAILY 01/24/19   Brunetta Jeans, PA-C  lisinopril (ZESTRIL) 20 MG tablet Take 1 tablet (20 mg total) by mouth daily. NEEDS APPOINTMENT FOR FUTURE REFILLS 03/24/19   Leonie Man, MD  metFORMIN (GLUCOPHAGE) 1000 MG tablet Take 1 tablet by mouth twice daily Patient taking differently: Take 1,000 mg by mouth in the morning and at bedtime.  03/24/19   Brunetta Jeans, PA-C  methocarbamol (ROBAXIN) 500 MG tablet Take 500 mg by mouth every 6 (six) hours as needed for muscle spasms. Reported on 09/25/2015    [provider]  potassium chloride (KLOR-CON) 10 MEQ tablet Take 1 tablet (10 mEq total) by mouth daily. Patient not taking: Reported on 06/29/2019 05/22/19   Minus Breeding, MD    pramipexole (MIRAPEX) 1 MG tablet Take 1 tablet (1 mg total) by mouth 2 (two) times daily. 03/30/19   Brunetta Jeans, PA-C    Allergies    Sulfa antibiotics, Sulfonamide derivatives, and Other  Review of Systems   Review of Systems  Constitutional: Negative for chills and fever.  Respiratory: Negative for cough, shortness of breath and wheezing.   Cardiovascular: Positive for leg swelling. Negative for chest pain and palpitations.  Gastrointestinal: Negative for abdominal pain, nausea and vomiting.  All other systems reviewed and are negative.   Physical Exam Updated Vital Signs  BP (!) 157/63 (BP Location: Right Arm)   Pulse 65   Temp 97.6 F (36.4 C) (Oral)   Resp (!) 22   Ht 5' (1.524 m)   Wt 96.6 kg   SpO2 99%   BMI 41.60 kg/m   Physical Exam Vitals and nursing note reviewed.  Constitutional:      General: She is not in acute distress.    Appearance: She is well-developed. She is obese.     Comments: Chronically ill appearing. Mildly tachypneic. Satting 100% on 4L via [place]. Hard of hearing  HENT:     Head: Normocephalic and atraumatic.  Eyes:     General: No scleral icterus.       Right eye: No discharge.        Left eye: No discharge.     Conjunctiva/sclera: Conjunctivae normal.     Pupils: Pupils are equal, round, and reactive to light.  Cardiovascular:     Rate and Rhythm: Normal rate and regular rhythm.  Pulmonary:     Effort: Pulmonary effort is normal. Tachypnea present. No respiratory distress.  Abdominal:     General: There is no distension.  Musculoskeletal:     Cervical back: Normal range of motion.     Right lower leg: Edema present.     Left lower leg: Edema present.     Comments: Bilateral 2+ pitting edema  Skin:    General: Skin is warm and dry.  Neurological:     Mental Status: She is alert and oriented to person, place, and time.  Psychiatric:        Behavior: Behavior normal.     ED Results / Procedures / Treatments   Labs (all  labs ordered are listed, but only abnormal results are displayed) Labs Reviewed  BASIC METABOLIC PANEL - Abnormal; Notable for the following components:      Result Value   Chloride 97 (*)    CO2 33 (*)    Glucose, Bld 132 (*)    Calcium 8.8 (*)    All other components within normal limits  CBC WITH DIFFERENTIAL/PLATELET - Abnormal; Notable for the following components:   Neutro Abs 7.8 (*)    All other components within normal limits  BRAIN NATRIURETIC PEPTIDE  TROPONIN I (HIGH SENSITIVITY)  TROPONIN I (HIGH SENSITIVITY)    EKG EKG Interpretation  Date/Time:  Friday July 07 2019 11:55:30 EST Ventricular Rate:  67 PR Interval:    QRS Duration: 86 QT Interval:  413 QTC Calculation: 436 R Axis:   -43 Text Interpretation: Sinus rhythm Atrial premature complex Inferior infarct, old Anterior infarct, old Confirmed by Veryl Speak [phone] on 07/07/2019 12:40:08 PM   Radiology DG Chest Port 1 View  Result Date: 07/07/2019 CLINICAL DATA:  Short of breath. EXAM: PORTABLE CHEST 1 VIEW COMPARISON:  Three hundred twenty-eight FINDINGS: Low lung volumes. Large cardiac silhouette. Chronic elevation LEFT hemidiaphragm. The LEFT lung base poorly evaluated. Mild central venous congestion. There are 2 hazy margined 4 mm nodules in the LEFT upper lobe IMPRESSION: 1. Central venous congestion with low lung volumes. 2. Two nodular foci in the LEFT upper lobe could represent airspace disease or peribronchial edema. Consider CT thorax to evaluate for pulmonary nodularity. Electronically Signed   By: Suzy Bouchard M.D.   On: 07/07/2019 12:57    Procedures Procedures (including critical care time)  Medications Ordered in ED Medications  furosemide (LASIX) injection 60 mg (60 mg Intravenous Given 07/07/19 1325)    ED Course  I  have reviewed the triage vital signs and the nursing notes.  Pertinent labs & imaging results that were available during my care of the patient were reviewed by me  and considered in my medical decision making (see chart for details).  79 year old female presents with acute respiratory failure with hypoxia. Likely from heart failure exacerbation. BP is elevated here. She is on 4L via [place] and satting at 100%. EKG is SR. Heart is regular rate and rhythm. She has crackles in the bilateral bases and is mildly tachypneic. She has bilateral pitting edema. Will obtain labs, CXR. Will give trial of lasix.  CXR shows venous congestion and two nodules in the left upper lobe. Doubt infectious etiology with normal WBC, normal temp, no cough. BMP shows mildly elevated bicarb (33). First and 2nd trop are 5 and 4 respectively. BNP is not elevated and on review of EMR is has never been high.   I talked with the patient and family. They want to try and go home. She was weaned down to 2L. Will have nursing ambulate her.  When walking with nursing she tripped and her legs gave out. She continues to be hypoxic on RA. At this point I think it is most prudent to have her admitted for IV diuresis due to poor functional status at home and requiring 24/7 O2. COVID test was not repeated since she had negative test 2 days ago. Shared visit with Dr. Lacinda Axon.   Discussed with Dr. Denton Brick with [facility] who will admit for acute respiratory failure with hypoxia due to CHF exacerbation.  MDM Rules/Calculators/A&P                      Final Clinical Impression(s) / ED Diagnoses Final diagnoses:  Acute on chronic congestive heart failure, unspecified heart failure type [facility])    Rx / DC Orders ED Discharge Orders    None       Recardo Evangelist, PA-C 07/07/19 1801    Nat Christen, MD 07/08/19 [phone]

## 2019-07-07 NOTE — ED Notes (Signed)
Pt ambulated in hallway with cane, This nurse and a tech assisting on either side. Pt states  " I walk at home with a walker"   Walking pt in the hallway, tripped over her feet, legs gave out and fell to her knee and then on her left hip. Denies pain. Never hit her head.  Pt able to stand and walk back to room. In bed resting at this time. PA notified

## 2019-07-07 NOTE — ED Triage Notes (Signed)
Patient was brought from short stay where she was to have cataract removal surgery. Patient was found to have oxygen saturations of 81 percent on room air. Patient wears 3L of oxygen at home at night only. Patient's oxygen saturation is 86 percent on room air at this time.

## 2019-07-07 NOTE — H&P (Signed)
History and Physical    [name]:[id number] DOB: [birth year] DOA: 07/07/2019  PCP: Brunetta Jeans, PA-C   Patient coming from: Home  I have personally briefly reviewed patient's old medical records in [place]  Chief Complaint: Low oxygen level.  HPI: [name] is a 79 y.o. female with medical history significant for hypertension, diastolic CHF, diabetes mellitus.  Patient was sent to the ED from ophthalmologist office with reports of low oxygen level. Patient saw her ophthalmologist today for a planned cataract extraction, but incidentally patient's O2 sats where 81 % on room air.  Per patient spouse who is also at bedside, he tells me patient uses oxygen at home sometimes, not every night, only if she feels she needs it. Patient denies chest pain.  She has no difficulty breathing.  She takes Lasix 40 mg daily.  She has no leg swelling. Patient tested negative for COVID-19 2 days ago.   ED Course: O2 sats 86% drops to 84% with ambulation.  Heart rate 60s to 70s.  Blood pressure systolic 353-299M.  BNP unremarkable at 53. Hs T 5 > 4.  EKG showed sinus rhythm, without significant change from prior.  Portable chest x-ray showed central venous congestion with low lung volumes.  IV Lasix 60 mg x 1 given in the ED.  Hospitalist to admit for further evaluation and management. On attempting to ambulate patient in the ED, she tripped over her legs, fell to her knees and left hip. She was able to stand and work back to her room. Patient reported she uses a walker at home to ambulate.  Review of Systems: As per HPI all other systems reviewed and negative.  Past Medical History:  Diagnosis Date  . AK (actinic keratosis)   . Anxiety    occasional  . Breast cancer ([place])    R breast, s/p radiation 34Gy/108f 04/29/10-05/05/10  . CTS (carpal tunnel syndrome)    Left  . [place]   . Diastolic CHF [place]    Dx w/ hospitalization 06/2015 for respiratory failure  . 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 Falls frequently    "can not pick left leg up"  . Hearing loss    bilateral hearing aides  . History of environmental allergies   . History of kidney stones 20 years ago  . Hypertension   . MDD (major depressive disorder)   . No blood products ([place])  09/11/2013  . Osteoarthritis    hx shoulder, knee, back, wrist pain; saw Dr. AWynelle Link  . Personal history of radiation therapy   . RESTLESS LEGS SYNDROME 05/05/2007   Qualifier: Diagnosis of  By: CGwenette GreetMD, KArmando Reichert  . RLS (restless legs syndrome)   . Sleep apnea     Past Surgical History:  Procedure Laterality Date  . APPENDECTOMY  age 89 . BREAST LUMPECTOMY Right 2012  . BREAST SURGERY  2012   rt breast lumpectomy  . flex signoidoscopy    . INCISION AND DRAINAGE PERIRECTAL ABSCESS    . JOINT REPLACEMENT Right 2006   knee  . TOTAL KNEE ARTHROPLASTY Left 08/06/2014   Procedure: LEFT TOTAL KNEE ARTHROPLASTY;  Surgeon: FGaynelle Arabian MD;  Location: [facility];  Service: Orthopedics;  Laterality: Left;  . TUBAL LIGATION  1979     reports that she has never smoked. She has never used smokeless tobacco. She reports that she does not drink alcohol or use drugs.  Allergies  Allergen Reactions  . Sulfa Antibiotics Swelling    Other reaction(s): Facial Swelling "swelling lips"   . Sulfonamide Derivatives Swelling    Lips Swelling  . Other     BLOOD PRODUCT REFUSAL    Family History  Problem Relation Age of Onset  . Alcohol abuse Father   . Cancer Sister        breast  . Heart disease Paternal Grandfather   . Diabetes Other   . Obesity Other   . Heart disease Brother        Valve    Prior to Admission medications   Medication Sig Start Date End Date Taking? Authorizing Provider  Accu-Chek FastClix Lancets MISC Use as directed to check blood sugar Patient not taking: Reported on 07/07/2019 05/11/19   Brunetta Jeans, PA-C  amLODipine (NORVASC) 5 MG tablet Take 1  tablet (5 mg total) by mouth daily. 04/19/19   Brunetta Jeans, PA-C  Blood Glucose Monitoring Suppl (ACCU-CHEK GUIDE ME) w/Device KIT 1 kit by Does not apply route daily. Dx:E11.40 Patient not taking: Reported on 07/07/2019 05/11/19   Brunetta Jeans, PA-C  carvedilol (COREG) 3.125 MG tablet TAKE 1 TABLET BY MOUTH TWICE DAILY WITH MEALS Patient taking differently: Take 3.125 mg by mouth in the morning and at bedtime.  05/09/19   Brunetta Jeans, PA-C  furosemide (LASIX) 40 MG tablet Take 1 tablet (40 mg total) by mouth daily. 12/13/18   Brunetta Jeans, PA-C  gabapentin (NEURONTIN) 100 MG capsule Take 1 capsule (100 mg total) by mouth 2 (two) times daily. 12/27/18   Brunetta Jeans, PA-C  glucose blood (ACCU-CHEK GUIDE) test strip Check blood sugars twice daily for diabetes. Dx:E11.9 Patient not taking: Reported on 07/07/2019 05/11/19   Brunetta Jeans, PA-C  Insulin Glargine (LANTUS SOLOSTAR) 100 UNIT/ML Solostar Pen INJECT 25 UNITS UNDER THE SKIN ONCE A DAY AT 10:00PM Patient taking differently: Inject 25 Units into the skin at bedtime. INJECT 25 UNITS UNDER THE SKIN ONCE A DAY AT 10:00PM 05/09/19   Brunetta Jeans, PA-C  Insulin Pen Needle (BD PEN NEEDLE NANO U/F) 32G X 4 MM MISC USE AS DIRECTED ONCE DAILY Patient not taking: Reported on 07/07/2019 01/24/19   Brunetta Jeans, PA-C  lisinopril (ZESTRIL) 20 MG tablet Take 1 tablet (20 mg total) by mouth daily. NEEDS APPOINTMENT FOR FUTURE REFILLS Patient taking differently: Take 20 mg by mouth daily.  03/24/19   Leonie Man, MD  metFORMIN (GLUCOPHAGE) 1000 MG tablet Take 1 tablet by mouth twice daily Patient taking differently: Take 1,000 mg by mouth in the morning and at bedtime.  03/24/19   Brunetta Jeans, PA-C  methocarbamol (ROBAXIN) 500 MG tablet Take 500 mg by mouth every 6 (six) hours as needed for muscle spasms. Reported on 09/25/2015    [provider]  pramipexole (MIRAPEX) 1 MG tablet Take 1 tablet (1 mg total) by  mouth 2 (two) times daily. 03/30/19   Brunetta Jeans, PA-C  potassium chloride (KLOR-CON) 10 MEQ tablet Take 1 tablet (10 mEq total) by mouth daily. Patient not taking: Reported on 06/29/2019 05/22/19 07/07/19  Minus Breeding, MD    Physical Exam: Vitals:   07/07/19 1530 07/07/19 1630 07/07/19 1700 07/07/19 1930  BP: 140/85 (!) 151/84 113/69 (!) 158/60  Pulse: 79 69 71 78  Resp: (!) 25 (!) '23 20 20  ' Temp:      TempSrc:      SpO2: 95% 96% 100% 93%  Weight:  Height:        Constitutional: NAD, calm, comfortable Vitals:   07/07/19 1530 07/07/19 1630 07/07/19 1700 07/07/19 1930  BP: 140/85 (!) 151/84 113/69 (!) 158/60  Pulse: 79 69 71 78  Resp: (!) 25 (!) '23 20 20  ' Temp:      TempSrc:      SpO2: 95% 96% 100% 93%  Weight:      Height:       Eyes: lids and conjunctivae normal ENMT: Mucous membranes are moist. Neck: normal, supple, no masses, no thyromegaly Respiratory: clear to auscultation bilaterally, no wheezing, no crackles. Normal respiratory effort. No accessory muscle use.  Cardiovascular: Regular rate and rhythm, no murmurs / rubs / gallops. No extremity edema. 2+ pedal pulses.   Abdomen: no tenderness, no masses palpated. No hepatosplenomegaly. Bowel sounds positive.  Musculoskeletal: no clubbing / cyanosis. No joint deformity upper and lower extremities. Good ROM, no contractures.  Skin: no rashes, lesions, ulcers. No induration Neurologic: Moving all extremities spontaneously, no apparent cranial nerve abnormality. Psychiatric: Normal judgment and insight. Alert and oriented x 3. Normal mood.   Labs on Admission: I have personally reviewed following labs and imaging studies  CBC: Recent Labs  Lab 07/07/19 1219  WBC 10.4  NEUTROABS 7.8*  HGB 13.9  HCT 45.5  MCV 98.7  PLT 295   Basic Metabolic Panel: Recent Labs  Lab 07/05/19 1422 07/07/19 1219  NA 141 140  K 3.6 3.6  CL 98 97*  CO2 32 33*  GLUCOSE 125* 132*  BUN 16 18  CREATININE 0.51 0.62    CALCIUM 8.7* 8.8*   HbA1C: Recent Labs    07/05/19 1423  HGBA1C 7.6*   CBG: Recent Labs  Lab 07/07/19 0956  GLUCAP 130*    Radiological Exams on Admission: DG Chest Port 1 View  Result Date: 07/07/2019 CLINICAL DATA:  Short of breath. EXAM: PORTABLE CHEST 1 VIEW COMPARISON:  Three hundred twenty-eight FINDINGS: Low lung volumes. Large cardiac silhouette. Chronic elevation LEFT hemidiaphragm. The LEFT lung base poorly evaluated. Mild central venous congestion. There are 2 hazy margined 4 mm nodules in the LEFT upper lobe IMPRESSION: 1. Central venous congestion with low lung volumes. 2. Two nodular foci in the LEFT upper lobe could represent airspace disease or peribronchial edema. Consider CT thorax to evaluate for pulmonary nodularity. Electronically Signed   By: Suzy Bouchard M.D.   On: 07/07/2019 12:57    EKG: Independently reviewed.  Sinus rhythm rate 67,  QTC 436.  No significant change from prior EKG.  Assessment/Plan Active Problems:   Hypoxia   Acute on chronic respiratory failure- O2 sats down to 84% with ambulation 86% at rest on room air.  Unremarkable lung exam.  On home O2 which she uses inconsistently at night, only as needed.  Peripheral sign of volume overload.  Weight stable per charts.  Reports compliance with Lasix 40 mg daily.  Chest x-ray shows central venous congestion.  Denies chest pain or dyspnea. BNP unremarkable at 53, but patient's BMI is 41.    -CTA chest rule out PE-negative for PE or other intrathoracic pathology, compressive atelectasis left lung base. -Patient will need to re-qualify for home O2  both day and night -Consider repeating ambulatory O2 in a.m. -IV Lasix 60 mg x 1 given in ED, -PT evaluation -Daily weights, strict input output  Fall -did not hit her head.  Able to ambulate after fall without pain.  Patient ambulates with a walker at baseline. -PT evaluation  Diastolic  CHF-appears euvolemic except for chest x-ray showing central  venous congestion.  Last echo 2017 EF 60 to 65%, G1 DD. -Obtain updated echocardiogram.  Hypertension-stable. -Resume home Norvasc, carvedilol,  -Holding lisinopril, home lasix with contrast exposure  Controlled diabetes mellitus-random glucose 132.  Recent HgbA1c 7.6. - SSI -Resume home Lantus at reduced dose 10 units nightly  Restless leg syndrome -Resume home pramipexole, gabapentin.  DVT prophylaxis: Lovenox Code Status: Full code Family Communication: Spouse at bedside Disposition Plan: 1 - 2 days Consults called: None Admission status: Obs, telemetry   Bethena Roys MD [facility]  07/07/2019, 9:56 PM

## 2019-07-07 NOTE — ED Notes (Signed)
Family at bedside. 

## 2019-07-07 NOTE — ED Notes (Signed)
Pt's oxygen while walking dropped to 84% on room air.  Pt placed back on 2L [place]

## 2019-07-07 NOTE — ED Notes (Signed)
ED Provider at bedside. 

## 2019-07-08 ENCOUNTER — Encounter (HOSPITAL_COMMUNITY): Payer: Self-pay | Admitting: Internal Medicine

## 2019-07-08 ENCOUNTER — Observation Stay (HOSPITAL_BASED_OUTPATIENT_CLINIC_OR_DEPARTMENT_OTHER): Payer: Medicare HMO

## 2019-07-08 DIAGNOSIS — N390 Urinary tract infection, site not specified: Secondary | ICD-10-CM | POA: Diagnosis present

## 2019-07-08 DIAGNOSIS — G9341 Metabolic encephalopathy: Secondary | ICD-10-CM | POA: Diagnosis present

## 2019-07-08 DIAGNOSIS — I5033 Acute on chronic diastolic (congestive) heart failure: Secondary | ICD-10-CM | POA: Diagnosis present

## 2019-07-08 DIAGNOSIS — H02834 Dermatochalasis of left upper eyelid: Secondary | ICD-10-CM | POA: Diagnosis present

## 2019-07-08 DIAGNOSIS — J9602 Acute respiratory failure with hypercapnia: Secondary | ICD-10-CM

## 2019-07-08 DIAGNOSIS — E1136 Type 2 diabetes mellitus with diabetic cataract: Secondary | ICD-10-CM | POA: Diagnosis present

## 2019-07-08 DIAGNOSIS — Z6841 Body Mass Index (BMI) 40.0 and over, adult: Secondary | ICD-10-CM | POA: Diagnosis not present

## 2019-07-08 DIAGNOSIS — Z794 Long term (current) use of insulin: Secondary | ICD-10-CM

## 2019-07-08 DIAGNOSIS — H259 Unspecified age-related cataract: Secondary | ICD-10-CM | POA: Diagnosis present

## 2019-07-08 DIAGNOSIS — Z853 Personal history of malignant neoplasm of breast: Secondary | ICD-10-CM | POA: Diagnosis not present

## 2019-07-08 DIAGNOSIS — E1140 Type 2 diabetes mellitus with diabetic neuropathy, unspecified: Secondary | ICD-10-CM

## 2019-07-08 DIAGNOSIS — G4733 Obstructive sleep apnea (adult) (pediatric): Secondary | ICD-10-CM | POA: Diagnosis present

## 2019-07-08 DIAGNOSIS — I110 Hypertensive heart disease with heart failure: Secondary | ICD-10-CM | POA: Diagnosis present

## 2019-07-08 DIAGNOSIS — B9620 Unspecified Escherichia coli [E. coli] as the cause of diseases classified elsewhere: Secondary | ICD-10-CM | POA: Diagnosis present

## 2019-07-08 DIAGNOSIS — H9190 Unspecified hearing loss, unspecified ear: Secondary | ICD-10-CM | POA: Diagnosis present

## 2019-07-08 DIAGNOSIS — W010XXA Fall on same level from slipping, tripping and stumbling without subsequent striking against object, initial encounter: Secondary | ICD-10-CM | POA: Diagnosis present

## 2019-07-08 DIAGNOSIS — Z87442 Personal history of urinary calculi: Secondary | ICD-10-CM | POA: Diagnosis not present

## 2019-07-08 DIAGNOSIS — Z923 Personal history of irradiation: Secondary | ICD-10-CM | POA: Diagnosis not present

## 2019-07-08 DIAGNOSIS — R296 Repeated falls: Secondary | ICD-10-CM | POA: Diagnosis present

## 2019-07-08 DIAGNOSIS — Z20822 Contact with and (suspected) exposure to covid-19: Secondary | ICD-10-CM | POA: Diagnosis present

## 2019-07-08 DIAGNOSIS — Z882 Allergy status to sulfonamides status: Secondary | ICD-10-CM | POA: Diagnosis not present

## 2019-07-08 DIAGNOSIS — I10 Essential (primary) hypertension: Secondary | ICD-10-CM | POA: Diagnosis not present

## 2019-07-08 DIAGNOSIS — I351 Nonrheumatic aortic (valve) insufficiency: Secondary | ICD-10-CM | POA: Diagnosis not present

## 2019-07-08 DIAGNOSIS — F419 Anxiety disorder, unspecified: Secondary | ICD-10-CM | POA: Diagnosis present

## 2019-07-08 DIAGNOSIS — G2581 Restless legs syndrome: Secondary | ICD-10-CM | POA: Diagnosis present

## 2019-07-08 DIAGNOSIS — J9622 Acute and chronic respiratory failure with hypercapnia: Secondary | ICD-10-CM | POA: Diagnosis present

## 2019-07-08 DIAGNOSIS — E872 Acidosis: Secondary | ICD-10-CM | POA: Diagnosis present

## 2019-07-08 DIAGNOSIS — J9621 Acute and chronic respiratory failure with hypoxia: Secondary | ICD-10-CM | POA: Diagnosis present

## 2019-07-08 DIAGNOSIS — R0902 Hypoxemia: Secondary | ICD-10-CM | POA: Diagnosis present

## 2019-07-08 DIAGNOSIS — Z974 Presence of external hearing-aid: Secondary | ICD-10-CM | POA: Diagnosis not present

## 2019-07-08 LAB — URINALYSIS, ROUTINE W REFLEX MICROSCOPIC
Bilirubin Urine: NEGATIVE
Glucose, UA: NEGATIVE mg/dL
Ketones, ur: NEGATIVE mg/dL
Nitrite: POSITIVE — AB
Protein, ur: NEGATIVE mg/dL
Specific Gravity, Urine: 1.01 (ref 1.005–1.030)
WBC, UA: >50 WBC/hpf — ABNORMAL HIGH (ref 0–5)
pH: 5 (ref 5.0–8.0)

## 2019-07-08 LAB — BLOOD GAS, ARTERIAL
Acid-Base Excess: 12 mmol/L — ABNORMAL HIGH (ref 0.0–2.0)
Bicarbonate: 32.7 mmol/L — ABNORMAL HIGH (ref 20.0–28.0)
FIO2: 32
O2 Saturation: 94.4 %
Patient temperature: 37
pCO2 arterial: 83.3 mmHg (ref 32.0–48.0)
pH, Arterial: 7.29 — ABNORMAL LOW (ref 7.350–7.450)
pO2, Arterial: 81 mmHg — ABNORMAL LOW (ref 83.0–108.0)

## 2019-07-08 LAB — GLUCOSE, CAPILLARY
Glucose-Capillary: 135 mg/dL — ABNORMAL HIGH (ref 70–99)
Glucose-Capillary: 179 mg/dL — ABNORMAL HIGH (ref 70–99)
Glucose-Capillary: 150 mg/dL — ABNORMAL HIGH (ref 70–99)
Glucose-Capillary: 188 mg/dL — ABNORMAL HIGH (ref 70–99)

## 2019-07-08 LAB — ECHOCARDIOGRAM COMPLETE
Height: 60 in
Weight: 3343.94 oz

## 2019-07-08 LAB — AMMONIA: Ammonia: 18 umol/L (ref 9–35)

## 2019-07-08 LAB — TSH: TSH: 0.902 u[IU]/mL (ref 0.350–4.500)

## 2019-07-08 MED ORDER — CHLORHEXIDINE GLUCONATE CLOTH 2 % EX PADS
6.0000 | MEDICATED_PAD | Freq: Every day | CUTANEOUS | Status: DC
  Administered 2019-07-08 – 2019-07-10 (×3): 6 via TOPICAL

## 2019-07-08 MED ORDER — IPRATROPIUM-ALBUTEROL 0.5-2.5 (3) MG/3ML IN SOLN
3.0000 mL | Freq: Four times a day (QID) | RESPIRATORY_TRACT | Status: DC
  Administered 2019-07-08 – 2019-07-09 (×5): 3 mL via RESPIRATORY_TRACT
  Filled 2019-07-08 (×5): qty 3

## 2019-07-08 MED ORDER — SODIUM CHLORIDE 0.9% FLUSH
3.0000 mL | Freq: Two times a day (BID) | INTRAVENOUS | Status: DC
  Administered 2019-07-08 – 2019-07-10 (×4): 3 mL via INTRAVENOUS

## 2019-07-08 MED ORDER — PERFLUTREN LIPID MICROSPHERE
1.0000 mL | INTRAVENOUS | Status: AC | PRN
  Administered 2019-07-08: 2 mL via INTRAVENOUS
  Filled 2019-07-08: qty 10

## 2019-07-08 MED ORDER — FUROSEMIDE 10 MG/ML IJ SOLN
40.0000 mg | Freq: Two times a day (BID) | INTRAMUSCULAR | Status: DC
  Administered 2019-07-08: 40 mg via INTRAVENOUS
  Filled 2019-07-08 (×2): qty 4

## 2019-07-08 MED ORDER — ORAL CARE MOUTH RINSE
15.0000 mL | Freq: Two times a day (BID) | OROMUCOSAL | Status: DC
  Administered 2019-07-08 – 2019-07-10 (×5): 15 mL via OROMUCOSAL

## 2019-07-08 NOTE — Progress Notes (Signed)
Report called to Dorinda Hill RN, patient transferred via bed to ICU 1. Respiratory called and informed of patient's transfer and that she will need Bipap.

## 2019-07-08 NOTE — Progress Notes (Signed)
PT Cancellation Note  Patient Details Name: [name] MRN: [id number] DOB: 11-12-1940   Cancelled Treatment:    Reason Eval/Treat Not Completed: Patient at procedure or test/unavailable  Patient receiving procedure in room.  Clarene Critchley PT, DPT 9:38 AM, 07/08/19 [phone]

## 2019-07-08 NOTE — Evaluation (Signed)
Physical Therapy Evaluation Patient Details Name: [name] MRN: [id number] DOB: 02/03/1941 Today's Date: 07/08/2019   History of Present Illness  [name] is a 79 y.o. female with medical history significant for hypertension, diastolic CHF, diabetes mellitus.  Patient was sent to the ED from ophthalmologist office with reports of low oxygen level.Patient saw her ophthalmologist today for a planned cataract extraction, but incidentally patient's O2 sats where 81 % on room air.  Per patient spouse who is also at bedside, he tells me patient uses oxygen at home sometimes, not every night, only if she feels she needs it.Patient denies chest pain.  She has no difficulty breathing.  She takes Lasix 40 mg daily.  She has no leg swelling.Patient tested negative for COVID-19 2 days ago.     Clinical Impression  Patient found awake and alert in bed upon entering. Patient's SpO2 at rest found to be 94% with 3 L O2 via nasal cannula. Removed O2 and SpO2 dropped to 80%. Resumed O2 and patient's SpO2 was in the 90s with ambulation. Patient required minimal guard to ambulate inside of room. Patient would benefit from continued skilled physical therapy while at hospital and at recommended venue below in order to improve overall endurance, strength, and functional mobility.     Follow Up Recommendations Home health PT;Supervision/Assistance - 24 hour    Equipment Recommendations  Other (comment)(At home O2)    Recommendations for Other Services       Precautions / Restrictions Precautions Precautions: Fall Restrictions Weight Bearing Restrictions: No      Mobility  Bed Mobility Overal bed mobility: Modified Independent             General bed mobility comments: Increased time  Transfers Overall transfer level: Modified independent Equipment used: Rolling walker (2 wheeled)             General transfer comment: Able to stand with RW and was  steady  Ambulation/Gait Ambulation/Gait assistance: Min guard Gait Distance (Feet): 20 Feet Assistive device: Rolling walker (2 wheeled) Gait Pattern/deviations: Step-through pattern;Shuffle Gait velocity: Decreased   General Gait Details: Patient on 3 L O2 via nasal cannula with ambuallation  Stairs            Wheelchair Mobility    Modified Rankin (Stroke Patients Only)       Balance Overall balance assessment: Needs assistance Sitting-balance support: Feet supported Sitting balance-Leahy Scale: Good Sitting balance - Comments: Good sitting balance at EOB   Standing balance support: Bilateral upper extremity supported;During functional activity Standing balance-Leahy Scale: Fair Standing balance comment: Patient's balance is fair with the walker                             Pertinent Vitals/Pain Pain Assessment: No/denies pain    Home Living Family/patient expects to be discharged to:: Private residence Living Arrangements: Spouse/significant other Available Help at Discharge: Family;Available 24 hours/day Type of Home: Other(Comment)(Double wide) Home Access: Ramped entrance     Home Layout: One level Home Equipment: Walker - 2 wheels;Bedside commode      Prior Function Level of Independence: Independent with assistive device(s)   Gait / Transfers Assistance Needed: Ambulates short distances with RW           Hand Dominance        Extremity/Trunk Assessment   Upper Extremity Assessment Upper Extremity Assessment: Generalized weakness    Lower Extremity Assessment Lower Extremity Assessment: Generalized weakness  Cervical / Trunk Assessment Cervical / Trunk Assessment: Kyphotic  Communication   Communication: HOH  Cognition Arousal/Alertness: Awake/alert Behavior During Therapy: WFL for tasks assessed/performed Overall Cognitive Status: Within Functional Limits for tasks assessed                                         General Comments      Exercises     Assessment/Plan    PT Assessment Patient needs continued PT services  PT Problem List Decreased strength;Decreased mobility;Decreased activity tolerance;Decreased balance       PT Treatment Interventions DME instruction;Gait training;Stair training;Functional mobility training;Therapeutic activities;Therapeutic exercise;Balance training;Neuromuscular re-education;Patient/family education    PT Goals (Current goals can be found in the Care Plan section)  Acute Rehab PT Goals Patient Stated Goal: Return home PT Goal Formulation: With patient Time For Goal Achievement: 07/15/19 Potential to Achieve Goals: Good    Frequency Min 3X/week   Barriers to discharge        Co-evaluation               AM-PAC PT "6 Clicks" Mobility  Outcome Measure Help needed turning from your back to your side while in a flat bed without using bedrails?: A Little Help needed moving from lying on your back to sitting on the side of a flat bed without using bedrails?: A Little Help needed moving to and from a bed to a chair (including a wheelchair)?: A Little Help needed standing up from a chair using your arms (e.g., wheelchair or bedside chair)?: A Little Help needed to walk in hospital room?: A Little Help needed climbing 3-5 steps with a railing? : A Lot 6 Click Score: 17    End of Session Equipment Utilized During Treatment: Gait belt Activity Tolerance: Patient tolerated treatment well Patient left: in chair;with chair alarm set Nurse Communication: Mobility status;Other (comment)(Pure Elza Rafter needs to be replaced) PT Visit Diagnosis: Unsteadiness on feet (R26.81);Other abnormalities of gait and mobility (R26.89);Muscle weakness (generalized) (M62.81);History of falling (Z91.81);Difficulty in walking, not elsewhere classified (R26.2)    Time: [id number] PT Time Calculation (min) (ACUTE ONLY): 30 min   Charges:   PT Evaluation $PT Eval  Moderate Complexity: 1 Mod        Clarene Critchley PT, DPT 11:48 AM, 07/08/19 [phone]

## 2019-07-08 NOTE — Plan of Care (Signed)
  Problem: Acute Rehab PT Goals(only PT should resolve) Goal: Pt Will Go Sit To Supine/Side Outcome: Progressing Flowsheets (Taken 07/08/2019 1150) Pt will go Sit to Supine/Side: Independently Goal: Patient Will Transfer Sit To/From Stand Outcome: Progressing Flowsheets (Taken 07/08/2019 1150) Patient will transfer sit to/from stand: with modified independence Goal: Pt Will Transfer Bed To Chair/Chair To Bed Outcome: Progressing Flowsheets (Taken 07/08/2019 1150) Pt will Transfer Bed to Chair/Chair to Bed: with modified independence Goal: Pt Will Ambulate Outcome: Progressing Flowsheets (Taken 07/08/2019 1150) Pt will Ambulate:  > 125 feet  with supervision  with least restrictive assistive device  Clarene Critchley PT, DPT 11:50 AM, 07/08/19 [phone]

## 2019-07-08 NOTE — Progress Notes (Signed)
*  PRELIMINARY RESULTS* Echocardiogram 2D Echocardiogram has been performed with Definity.  [name] 07/08/2019, 10:22 AM

## 2019-07-08 NOTE — Progress Notes (Signed)
PROGRESS NOTE    [name]  [id number] DOB: [birth year] DOA: 07/07/2019 PCP: Brunetta Jeans, PA-C    Brief Narrative:  79 year old female with a history of hypertension, diastolic heart failure, diabetes, was brought to the hospital when she was at her ophthalmologist today was noted to be hypoxic on room air with an O2 saturation of 81%.  She brought to the emergency room where chest x-ray indicated possible volume overload.  She received a dose of IV Lasix and was referred for admission.  He was noted that she has been increasingly somnolent lately.  ABG performed showed elevated PCO2 and evidence of respiratory acidosis.  Patient was subsequently transferred to the stepdown unit and started on BiPAP.   Assessment & Plan:   Active Problems:   RESTLESS LEGS SYNDROME   Essential hypertension   Type 2 diabetes mellitus with diabetic neuropathy (HCC)   Hypoxia   Acute respiratory failure with hypercapnia (HCC)   Acute metabolic encephalopathy   Acute on chronic diastolic CHF (congestive heart failure) ([place])   1. Acute on respiratory failure with hypoxia and hypercapnia.  Blood gas shows elevated PCO2 with uncompensated respiratory acidosis.  Patient was transferred to stepdown unit and started on BiPAP.  Repeat ABG in a.m. 2. Acute on chronic diastolic congestive heart failure.  She received dose of intravenous Lasix yesterday.  Still has some evidence of volume overload.  We will continue on IV Lasix for now. 3. Acute metabolic encephalopathy.  Secondary to elevated PCO2.  Husband has been reporting that she has been sleeping more lately.  Continue to follow mental status as PCO2 improved with BiPAP. 4. Diabetes.  Hold Metformin.  Continue sliding scale insulin and lantus. 5. Restless leg syndrome. Continue mirapex 6. HTN. Continue on amlodipine and coreg   DVT prophylaxis: lovenox Code Status: full code Family Communication: discussed with husband at bedside Disposition  Plan: transfer to SDU for bipap therapy.    Consultants:     Procedures:   Echo  Antimicrobials:       Subjective: Patient has been somnolent today. Continues to require supplemental oxygen, now on 3L  Objective: Vitals:   07/08/19 1458 07/08/19 1500 07/08/19 1600 07/08/19 1611  BP:  123/72 (!) 146/80   Pulse: 65 67 61   Resp: 18 19 13    Temp:    97.6 F (36.4 C)  TempSrc:    Axillary  SpO2: 95% 97% 96%   Weight:      Height:        Intake/Output Summary (Last 24 hours) at 07/08/2019 1659 Last data filed at 07/07/2019 2334 Gross per 24 hour  Intake --  Output 1 ml  Net -1 ml   Filed Weights   07/07/19 1131 07/07/19 2242  Weight: 96.6 kg 94.8 kg    Examination:  General exam: Appears calm and comfortable  Respiratory system: Clear to auscultation. Respiratory effort normal. Cardiovascular system: S1 & S2 heard, RRR. No JVD, murmurs, rubs, gallops or clicks. 1+ pedal edema. Gastrointestinal system: Abdomen is nondistended, soft and nontender. No organomegaly or masses felt. Normal bowel sounds heard. Central nervous system: somnolent, but wakes up to voice. No focal neurological deficits. Extremities: Symmetric 5 x 5 power. Skin: No rashes, lesions or ulcers Psychiatry: Judgement and insight appear normal. Mood & affect appropriate.     Data Reviewed: I have personally reviewed following labs and imaging studies  CBC: Recent Labs  Lab 07/07/19 1219  WBC 10.4  NEUTROABS 7.8*  HGB 13.9  HCT 45.5  MCV 98.7  PLT [id number]   Basic Metabolic Panel: Recent Labs  Lab 07/05/19 1422 07/07/19 1219  NA 141 140  K 3.6 3.6  CL 98 97*  CO2 32 33*  GLUCOSE 125* 132*  BUN 16 18  CREATININE 0.51 0.62  CALCIUM 8.7* 8.8*   GFR: Estimated Creatinine Clearance: 57.7 mL/min (by C-G formula based on SCr of 0.62 mg/dL). Liver Function Tests: No results for input(s): AST, ALT, ALKPHOS, BILITOT, PROT, ALBUMIN in the last 168 hours. No results for input(s): LIPASE,  AMYLASE in the last 168 hours. Recent Labs  Lab 07/08/19 1302  AMMONIA 18   Coagulation Profile: No results for input(s): INR, PROTIME in the last 168 hours. Cardiac Enzymes: No results for input(s): CKTOTAL, CKMB, CKMBINDEX, TROPONINI in the last 168 hours. BNP (last 3 results) No results for input(s): PROBNP in the last 8760 hours. HbA1C: No results for input(s): HGBA1C in the last 72 hours. CBG: Recent Labs  Lab 07/07/19 0956 07/07/19 2305 07/08/19 0759 07/08/19 1130 07/08/19 1610  GLUCAP 130* 229* 135* 179* 150*   Lipid Profile: No results for input(s): CHOL, HDL, LDLCALC, TRIG, CHOLHDL, LDLDIRECT in the last 72 hours. Thyroid Function Tests: Recent Labs    07/08/19 1302  TSH 0.902   Anemia Panel: No results for input(s): VITAMINB12, FOLATE, FERRITIN, TIBC, IRON, RETICCTPCT in the last 72 hours. Sepsis Labs: No results for input(s): PROCALCITON, LATICACIDVEN in the last 168 hours.  Recent Results (from the past 240 hour(s))  SARS CORONAVIRUS 2 (TAT 6-24 HRS) Nasopharyngeal Nasopharyngeal Swab     Status: None   Collection Time: 07/05/19  7:15 AM   Specimen: Nasopharyngeal Swab  Result Value Ref Range Status   SARS Coronavirus 2 NEGATIVE NEGATIVE Final    Comment: (NOTE) SARS-CoV-2 target nucleic acids are NOT DETECTED. The SARS-CoV-2 RNA is generally detectable in upper and lower respiratory specimens during the acute phase of infection. Negative results do not preclude SARS-CoV-2 infection, do not rule out co-infections with other pathogens, and should not be used as the sole basis for treatment or other patient management decisions. Negative results must be combined with clinical observations, patient history, and epidemiological information. The expected result is Negative. Fact Sheet for Patients: SugarRoll.be Fact Sheet for Healthcare Providers: https://www.woods-mathews.com/ This test is not yet approved or  cleared by the [place] FDA and  has been authorized for detection and/or diagnosis of SARS-CoV-2 by FDA under an Emergency Use Authorization (EUA). This EUA will remain  in effect (meaning this test can be used) for the duration of the COVID-19 declaration under Section 56 4(b)(1) of the Act, 21 U.S.C. section 360bbb-3(b)(1), unless the authorization is terminated or revoked sooner. Performed at [facility], [place] [address] [place], [place] [address]          Radiology Studies: CT ANGIO CHEST PE W OR WO CONTRAST  Result Date: 07/07/2019 CLINICAL DATA:  79 year old female with hypoxia. EXAM: CT ANGIOGRAPHY CHEST WITH CONTRAST TECHNIQUE: Multidetector CT imaging of the chest was performed using the standard protocol during bolus administration of intravenous contrast. Multiplanar CT image reconstructions and MIPs were obtained to evaluate the vascular anatomy. CONTRAST:  33mL OMNIPAQUE IOHEXOL 350 MG/ML SOLN COMPARISON:  Chest radiograph dated 07/07/2019. Comparison is also made to the chest CT dated 07/02/2015. FINDINGS: Cardiovascular: There is no cardiomegaly or pericardial effusion. Coronary vascular calcifications noted. There is moderate atherosclerotic calcification of the thoracic aorta. The origins of the great vessels of the aortic arch  appear patent as visualized. Evaluation of the pulmonary arteries is limited due to eventration of the left hemidiaphragm and suboptimal visualization of the peripheral branches. No large or central pulmonary artery embolus identified. Mediastinum/Nodes: There is no hilar or mediastinal adenopathy. The esophagus is grossly unremarkable. No mediastinal fluid collection. Lungs/Pleura: There is eventration of the left hemidiaphragm with mass effect and partial left lung base atelectasis similar to prior CT. Pneumonia is not excluded. Clinical correlation is recommended. Right lung base linear atelectasis/scarring noted. There is no pleural  effusion or pneumothorax. The central airways are patent. Upper Abdomen: Probable fatty liver. Partially visualized indeterminate hypodense lesion from the anterior interpolar left kidney similar to prior CT, likely a cyst. Musculoskeletal: Degenerative changes of the spine. No acute osseous pathology. A 3.0 x 2.4 cm ovoid lesion in the right breast appears similar to prior CT, incompletely characterized, possibly a focal area of fat contusion. Review of the MIP images confirms the above findings. IMPRESSION: 1. No acute intrathoracic pathology. No CT evidence of pulmonary embolism. 2. Eventration of the left hemidiaphragm with associated partial compressive atelectasis of the left lung base. The overall findings are similar to the CT of 2017. Electronically Signed   By: Anner Crete M.D.   On: 07/07/2019 21:09   DG Chest Port 1 View  Result Date: 07/07/2019 CLINICAL DATA:  Short of breath. EXAM: PORTABLE CHEST 1 VIEW COMPARISON:  Three hundred twenty-eight FINDINGS: Low lung volumes. Large cardiac silhouette. Chronic elevation LEFT hemidiaphragm. The LEFT lung base poorly evaluated. Mild central venous congestion. There are 2 hazy margined 4 mm nodules in the LEFT upper lobe IMPRESSION: 1. Central venous congestion with low lung volumes. 2. Two nodular foci in the LEFT upper lobe could represent airspace disease or peribronchial edema. Consider CT thorax to evaluate for pulmonary nodularity. Electronically Signed   By: Suzy Bouchard M.D.   On: 07/07/2019 12:57   ECHOCARDIOGRAM COMPLETE  Result Date: 07/08/2019    ECHOCARDIOGRAM REPORT   Patient Name:   [name] Date of Exam: 07/08/2019 Medical Rec #:  [id number]     Height:       60.0 in Accession #:    [id number]    Weight:       209.0 lb Date of Birth:  [birth year]     BSA:          1.902 m Patient Age:    86 years      BP:           159/79 mmHg Patient Gender: F             HR:           78 bpm. Exam Location:  [facility] Procedure: 2D Echo,  Cardiac Doppler and Color Doppler Indications:    Congestive Heart Failure 428.0 / I50.9  History:        Patient has prior history of Echocardiogram examinations, most                 recent 07/03/2015. CHF; Risk Factors:Hypertension and Diabetes.                 Breast cancer of upper-outer quadrant of right female                 breast,Hearing loss - has hearing aides, follow by                 audiologist,Obesity,Obstructive sleep apnea.  Sonographer:    Alvino Chapel RCS  Referring Phys: [place]  1. Left ventricular ejection fraction, by estimation, is 60 to 65%. The left ventricle has normal function. The left ventricle has no regional wall motion abnormalities. There is mild to moderate left ventricular hypertrophy. Left ventricular diastolic parameters are consistent with Grade I diastolic dysfunction (impaired relaxation).  2. Right ventricular systolic function is normal. The right ventricular size is normal. Tricuspid regurgitation signal is inadequate for assessing PA pressure.  3. The mitral valve is grossly normal, there is mild to moderate annular calcification. Trivial mitral valve regurgitation.  4. The aortic valve was not well visualized. Mild aortic leaflet calcification. Aortic valve regurgitation is mild.  5. IVC dilated, but not able to estimate CVP (no sniff). FINDINGS  Left Ventricle: Left ventricular ejection fraction, by estimation, is 60 to 65%. The left ventricle has normal function. The left ventricle has no regional wall motion abnormalities. Definity contrast agent was given IV to delineate the left ventricular  endocardial borders. The left ventricular internal cavity size was normal in size. There is moderate left ventricular hypertrophy. Left ventricular diastolic parameters are consistent with Grade I diastolic dysfunction (impaired relaxation). Right Ventricle: The right ventricular size is normal. No increase in right ventricular wall thickness.  Right ventricular systolic function is normal. Tricuspid regurgitation signal is inadequate for assessing PA pressure. Left Atrium: Left atrial size was normal in size. Right Atrium: Right atrial size was normal in size. Pericardium: There is no evidence of pericardial effusion. Presence of pericardial fat pad. Mitral Valve: The mitral valve is grossly normal. Mild to moderate mitral annular calcification. Trivial mitral valve regurgitation. Tricuspid Valve: The tricuspid valve is not well visualized. Tricuspid valve regurgitation is trivial. Aortic Valve: The aortic valve was not well visualized. Aortic valve regurgitation is mild. Aortic regurgitation PHT measures 560 msec. There is mild calcification of the aortic valve. Pulmonic Valve: The pulmonic valve was grossly normal. Pulmonic valve regurgitation is trivial. Aorta: The aortic root is normal in size and structure. Venous: IVC dilated, but not able to estimate CVP (no sniff). IAS/Shunts: No atrial level shunt detected by color flow Doppler.  LEFT VENTRICLE PLAX 2D LVIDd:         3.40 cm  Diastology LVIDs:         2.34 cm  LV e' lateral:   10.00 cm/s LV PW:         1.13 cm  LV E/e' lateral: 6.1 LV IVS:        1.43 cm  LV e' medial:    5.11 cm/s LVOT diam:     1.70 cm  LV E/e' medial:  12.0 LV SV:         41.08 ml LV SV Index:   21.60 LVOT Area:     2.27 cm  RIGHT VENTRICLE RV S prime:     15.80 cm/s TAPSE (M-mode): 2.6 cm LEFT ATRIUM             Index LA diam:        1.65 cm 0.87 cm/m LA Vol (A2C):   57.3 ml 30.13 ml/m LA Vol (A4C):   54.1 ml 28.45 ml/m LA Biplane Vol: 56.6 ml 29.76 ml/m  AORTIC VALVE LVOT Vmax:   86.20 cm/s LVOT Vmean:  55.500 cm/s LVOT VTI:    0.181 m AI PHT:      560 msec  AORTA Ao Root diam: 3.30 cm MITRAL VALVE MV Area (PHT): 2.73 cm    SHUNTS MV Decel Time: 278  msec    Systemic VTI:  0.18 m MV E velocity: 61.30 cm/s  Systemic Diam: 1.70 cm MV A velocity: 99.00 cm/s MV E/A ratio:  0.62 Rozann Lesches MD Electronically signed by  Rozann Lesches MD Signature Date/Time: 07/08/2019/1:55:20 PM    Final         Scheduled Meds: . amLODipine  5 mg Oral Daily  . carvedilol  3.125 mg Oral BID  . Chlorhexidine Gluconate Cloth  6 each Topical Daily  . enoxaparin (LOVENOX) injection  40 mg Subcutaneous QHS  . furosemide  40 mg Intravenous BID  . insulin aspart  0-9 Units Subcutaneous TID WC  . insulin glargine  10 Units Subcutaneous QHS  . ipratropium-albuterol  3 mL Nebulization Q6H  . pramipexole  1 mg Oral BID   Continuous Infusions:   LOS: 0 days    Time spent: 42mins    Kathie Dike, MD [facility]   If 7PM-7AM, please contact night-coverage [email or web address]  07/08/2019, 4:59 PM

## 2019-07-09 ENCOUNTER — Inpatient Hospital Stay (HOSPITAL_COMMUNITY): Payer: Medicare HMO

## 2019-07-09 LAB — CBC
HCT: 43.1 % (ref 36.0–46.0)
Hemoglobin: 13.1 g/dL (ref 12.0–15.0)
MCH: 30.5 pg (ref 26.0–34.0)
MCHC: 30.4 g/dL (ref 30.0–36.0)
MCV: 100.5 fL — ABNORMAL HIGH (ref 80.0–100.0)
Platelets: 248 10*3/uL (ref 150–400)
RBC: 4.29 MIL/uL (ref 3.87–5.11)
RDW: 13.9 % (ref 11.5–15.5)
WBC: 9.7 10*3/uL (ref 4.0–10.5)
nRBC: 0 % (ref 0.0–0.2)

## 2019-07-09 LAB — BLOOD GAS, ARTERIAL
Acid-Base Excess: 13.5 mmol/L — ABNORMAL HIGH (ref 0.0–2.0)
Bicarbonate: 34.9 mmol/L — ABNORMAL HIGH (ref 20.0–28.0)
FIO2: 35
O2 Saturation: 94.2 %
Patient temperature: 37
pCO2 arterial: 72.2 mmHg (ref 32.0–48.0)
pH, Arterial: 7.357 (ref 7.350–7.450)
pO2, Arterial: 74.4 mmHg — ABNORMAL LOW (ref 83.0–108.0)

## 2019-07-09 LAB — BASIC METABOLIC PANEL
Anion gap: 7 (ref 5–15)
BUN: 19 mg/dL (ref 8–23)
CO2: 40 mmol/L — ABNORMAL HIGH (ref 22–32)
Calcium: 8.4 mg/dL — ABNORMAL LOW (ref 8.9–10.3)
Chloride: 90 mmol/L — ABNORMAL LOW (ref 98–111)
Creatinine, Ser: 0.75 mg/dL (ref 0.44–1.00)
GFR calc Af Amer: >60 mL/min (ref >60)
GFR calc non Af Amer: >60 mL/min (ref >60)
Glucose, Bld: 127 mg/dL — ABNORMAL HIGH (ref 70–99)
Potassium: 3.5 mmol/L (ref 3.5–5.1)
Sodium: 137 mmol/L (ref 135–145)

## 2019-07-09 LAB — GLUCOSE, CAPILLARY
Glucose-Capillary: 109 mg/dL — ABNORMAL HIGH (ref 70–99)
Glucose-Capillary: 207 mg/dL — ABNORMAL HIGH (ref 70–99)
Glucose-Capillary: 177 mg/dL — ABNORMAL HIGH (ref 70–99)
Glucose-Capillary: 207 mg/dL — ABNORMAL HIGH (ref 70–99)

## 2019-07-09 LAB — MRSA PCR SCREENING: MRSA by PCR: NEGATIVE

## 2019-07-09 MED ORDER — SODIUM CHLORIDE 0.9 % IV SOLN
INTRAVENOUS | Status: DC | PRN
  Administered 2019-07-09: 250 mL via INTRAVENOUS

## 2019-07-09 MED ORDER — IPRATROPIUM-ALBUTEROL 0.5-2.5 (3) MG/3ML IN SOLN
3.0000 mL | Freq: Three times a day (TID) | RESPIRATORY_TRACT | Status: DC
  Administered 2019-07-09 – 2019-07-11 (×5): 3 mL via RESPIRATORY_TRACT
  Filled 2019-07-09 (×4): qty 3

## 2019-07-09 MED ORDER — SODIUM CHLORIDE 0.9 % IV SOLN
1.0000 g | INTRAVENOUS | Status: DC
  Administered 2019-07-09 – 2019-07-11 (×3): 1 g via INTRAVENOUS
  Filled 2019-07-09 (×3): qty 10

## 2019-07-09 NOTE — Progress Notes (Signed)
Patient has been off BiPAP since about 1900, she appears to be doing well , she has had a sleep study in the past and this indicated she has sever sleep apnea. Also stated on report she did not think she could tolerate CPAP.  She does have 3 liters oxygen when she sleeps. As of note she is watching tv not ready for BiPAP.

## 2019-07-09 NOTE — Progress Notes (Signed)
Patient placed on BiPAP about 2 am. Appears to be doing well.

## 2019-07-09 NOTE — Progress Notes (Signed)
Ardith Dark notified of HS CBG 207.

## 2019-07-09 NOTE — Progress Notes (Signed)
PROGRESS NOTE    [name]  [id number] DOB: July 23, 1940 DOA: 07/07/2019 PCP: Brunetta Jeans, PA-C    Brief Narrative:  79 year old female with a history of hypertension, diastolic heart failure, diabetes, was brought to the hospital when she was at her ophthalmologist today was noted to be hypoxic on room air with an O2 saturation of 81%.  She brought to the emergency room where chest x-ray indicated possible volume overload.  She received a dose of IV Lasix and was referred for admission.  He was noted that she has been increasingly somnolent lately.  ABG performed showed elevated PCO2 and evidence of respiratory acidosis.  Patient was subsequently transferred to the stepdown unit and started on BiPAP.   Assessment & Plan:   Active Problems:   RESTLESS LEGS SYNDROME   Essential hypertension   Type 2 diabetes mellitus with diabetic neuropathy (HCC)   Hypoxia   Acute respiratory failure with hypercapnia (HCC)   Acute metabolic encephalopathy   Acute on chronic diastolic CHF (congestive heart failure) ([facility])   1. Acute on respiratory failure with hypoxia and hypercapnia.  Blood gas shows elevated PCO2 with uncompensated respiratory acidosis.  Patient was transferred to stepdown unit and started on BiPAP.  Follow-up ABG showed improving PCO2.  She does have a history of sleep apnea, but is noncompliant with CPAP.  Will consider pulmonology consult in a.m.  Since she is on 3 L of oxygen, will repeat chest x-ray today. 2. Metabolic encephalopathy.  Patient was increasingly drowsy related to hypercapnia.  Overall mental status is improving with BiPAP therapy. 3. Possible UTI.  Urinalysis indicates possible infection.  She did have a temperature of 100 today.  Will start on ceftriaxone and check urine culture. 4. Acute on chronic diastolic congestive heart failure.  Patient was receiving IV Lasix.  Overall volume status appears to have improved.  Currently, she appears to be euvolemic.   Will discontinue further IV Lasix today.  Resume oral Lasix in a.m.. 5. Acute metabolic encephalopathy.  Secondary to elevated PCO2.  Husband has been reporting that she has been sleeping more lately.  Overall mental status appears to improve after patient started on BiPAP.  PCO2 is trending down. 6. Diabetes.  Hold Metformin.  Continue sliding scale insulin and lantus.  Blood sugars have been stable 7. Restless leg syndrome. Continue mirapex 8. HTN.  Blood pressure stable.  Continue on amlodipine and coreg   DVT prophylaxis: lovenox Code Status: full code Family Communication: discussed with husband at bedside Disposition Plan: Monitor in ICU over today.  Can consider transfer to telemetry tomorrow if remains stable overnight   Consultants:     Procedures:   Echo  Antimicrobials:   Ceftriaxone 2/21 >   Subjective: Patient did wear BiPAP for some period overnight.  She feels less tired today.  Does appear to be more awake and alert today.  Objective: Vitals:   07/09/19 1335 07/09/19 1400 07/09/19 1621 07/09/19 1709  BP:  (!) 159/77  (!) 151/80  Pulse:  68  73  Resp:  20    Temp:   97.9 F (36.6 C)   TempSrc:   Oral   SpO2: 95% 94%    Weight:      Height:        Intake/Output Summary (Last 24 hours) at 07/09/2019 1808 Last data filed at 07/09/2019 1458 Gross per 24 hour  Intake 453.48 ml  Output 2100 ml  Net -1646.52 ml   Filed Weights   07/07/19  1131 07/07/19 2242  Weight: 96.6 kg 94.8 kg    Examination:  General exam: Alert, awake, oriented x 3 Respiratory system: Diminished breath sounds bilaterally.  Increased respiratory effort. Cardiovascular system:RRR. No murmurs, rubs, gallops. Gastrointestinal system: Abdomen is nondistended, soft and nontender. No organomegaly or masses felt. Normal bowel sounds heard. Central nervous system: Alert and oriented. No focal neurological deficits. Extremities: No C/C/E, +pedal pulses Skin: No rashes, lesions or  ulcers Psychiatry: Judgement and insight appear normal. Mood & affect appropriate.      Data Reviewed: I have personally reviewed following labs and imaging studies  CBC: Recent Labs  Lab 07/07/19 1219 07/09/19 0448  WBC 10.4 9.7  NEUTROABS 7.8*  --   HGB 13.9 13.1  HCT 45.5 43.1  MCV 98.7 100.5*  PLT 270 [id number]   Basic Metabolic Panel: Recent Labs  Lab 07/05/19 1422 07/07/19 1219 07/09/19 0448  NA 141 140 137  K 3.6 3.6 3.5  CL 98 97* 90*  CO2 32 33* 40*  GLUCOSE 125* 132* 127*  BUN 16 18 19   CREATININE 0.51 0.62 0.75  CALCIUM 8.7* 8.8* 8.4*   GFR: Estimated Creatinine Clearance: 57.7 mL/min (by C-G formula based on SCr of 0.75 mg/dL). Liver Function Tests: No results for input(s): AST, ALT, ALKPHOS, BILITOT, PROT, ALBUMIN in the last 168 hours. No results for input(s): LIPASE, AMYLASE in the last 168 hours. Recent Labs  Lab 07/08/19 1302  AMMONIA 18   Coagulation Profile: No results for input(s): INR, PROTIME in the last 168 hours. Cardiac Enzymes: No results for input(s): CKTOTAL, CKMB, CKMBINDEX, TROPONINI in the last 168 hours. BNP (last 3 results) No results for input(s): PROBNP in the last 8760 hours. HbA1C: No results for input(s): HGBA1C in the last 72 hours. CBG: Recent Labs  Lab 07/08/19 1610 07/08/19 2124 07/09/19 0714 07/09/19 1204 07/09/19 1624  GLUCAP 150* 188* 109* 207* 177*   Lipid Profile: No results for input(s): CHOL, HDL, LDLCALC, TRIG, CHOLHDL, LDLDIRECT in the last 72 hours. Thyroid Function Tests: Recent Labs    07/08/19 1302  TSH 0.902   Anemia Panel: No results for input(s): VITAMINB12, FOLATE, FERRITIN, TIBC, IRON, RETICCTPCT in the last 72 hours. Sepsis Labs: No results for input(s): PROCALCITON, LATICACIDVEN in the last 168 hours.  Recent Results (from the past 240 hour(s))  SARS CORONAVIRUS 2 (TAT 6-24 HRS) Nasopharyngeal Nasopharyngeal Swab     Status: None   Collection Time: 07/05/19  7:15 AM   Specimen:  Nasopharyngeal Swab  Result Value Ref Range Status   SARS Coronavirus 2 NEGATIVE NEGATIVE Final    Comment: (NOTE) SARS-CoV-2 target nucleic acids are NOT DETECTED. The SARS-CoV-2 RNA is generally detectable in upper and lower respiratory specimens during the acute phase of infection. Negative results do not preclude SARS-CoV-2 infection, do not rule out co-infections with other pathogens, and should not be used as the sole basis for treatment or other patient management decisions. Negative results must be combined with clinical observations, patient history, and epidemiological information. The expected result is Negative. Fact Sheet for Patients: SugarRoll.be Fact Sheet for Healthcare Providers: https://www.woods-mathews.com/ This test is not yet approved or cleared by the [place] FDA and  has been authorized for detection and/or diagnosis of SARS-CoV-2 by FDA under an Emergency Use Authorization (EUA). This EUA will remain  in effect (meaning this test can be used) for the duration of the COVID-19 declaration under Section 56 4(b)(1) of the Act, 21 U.S.C. section 360bbb-3(b)(1), unless the authorization is terminated or  revoked sooner. Performed at [facility], [place] [address], [place], [place] [address]   MRSA PCR Screening     Status: None   Collection Time: 07/08/19 10:49 PM   Specimen: Nasal Mucosa; Nasopharyngeal  Result Value Ref Range Status   MRSA by PCR NEGATIVE NEGATIVE Final    Comment:        The GeneXpert MRSA Assay (FDA approved for NASAL specimens only), is one component of a comprehensive MRSA colonization surveillance program. It is not intended to diagnose MRSA infection nor to guide or monitor treatment for MRSA infections. Performed at [facility], [address], [place], [place] [address]          Radiology Studies: CT ANGIO CHEST PE W OR WO CONTRAST  Result Date: 07/07/2019 CLINICAL  DATA:  79 year old female with hypoxia. EXAM: CT ANGIOGRAPHY CHEST WITH CONTRAST TECHNIQUE: Multidetector CT imaging of the chest was performed using the standard protocol during bolus administration of intravenous contrast. Multiplanar CT image reconstructions and MIPs were obtained to evaluate the vascular anatomy. CONTRAST:  95mL OMNIPAQUE IOHEXOL 350 MG/ML SOLN COMPARISON:  Chest radiograph dated 07/07/2019. Comparison is also made to the chest CT dated 07/02/2015. FINDINGS: Cardiovascular: There is no cardiomegaly or pericardial effusion. Coronary vascular calcifications noted. There is moderate atherosclerotic calcification of the thoracic aorta. The origins of the great vessels of the aortic arch appear patent as visualized. Evaluation of the pulmonary arteries is limited due to eventration of the left hemidiaphragm and suboptimal visualization of the peripheral branches. No large or central pulmonary artery embolus identified. Mediastinum/Nodes: There is no hilar or mediastinal adenopathy. The esophagus is grossly unremarkable. No mediastinal fluid collection. Lungs/Pleura: There is eventration of the left hemidiaphragm with mass effect and partial left lung base atelectasis similar to prior CT. Pneumonia is not excluded. Clinical correlation is recommended. Right lung base linear atelectasis/scarring noted. There is no pleural effusion or pneumothorax. The central airways are patent. Upper Abdomen: Probable fatty liver. Partially visualized indeterminate hypodense lesion from the anterior interpolar left kidney similar to prior CT, likely a cyst. Musculoskeletal: Degenerative changes of the spine. No acute osseous pathology. A 3.0 x 2.4 cm ovoid lesion in the right breast appears similar to prior CT, incompletely characterized, possibly a focal area of fat contusion. Review of the MIP images confirms the above findings. IMPRESSION: 1. No acute intrathoracic pathology. No CT evidence of pulmonary embolism.  2. Eventration of the left hemidiaphragm with associated partial compressive atelectasis of the left lung base. The overall findings are similar to the CT of 2017. Electronically Signed   By: Anner Crete M.D.   On: 07/07/2019 21:09   DG CHEST PORT 1 VIEW  Result Date: 07/09/2019 CLINICAL DATA:  Shortness of breath. EXAM: PORTABLE CHEST 1 VIEW COMPARISON:  07/07/2019 FINDINGS: Patient is rotated to the left. Stable elevation left hemidiaphragm. Subtle patchy hazy density over the left base which may be due to atelectasis or infection. Stable cardiomegaly. Remainder the exam is unchanged. IMPRESSION: Subtle patchy density left base which may be due to atelectasis or infection. Stable cardiomegaly. Electronically Signed   By: Marin Olp M.D.   On: 07/09/2019 12:11   ECHOCARDIOGRAM COMPLETE  Result Date: 07/08/2019    ECHOCARDIOGRAM REPORT   Patient Name:   HEDWIG HAMME Date of Exam: 07/08/2019 Medical Rec #:  [id number]     Height:       60.0 in Accession #:    [id number]    Weight:  209.0 lb Date of Birth:  27-Mar-1941     BSA:          1.902 m Patient Age:    89 years      BP:           159/79 mmHg Patient Gender: F             HR:           78 bpm. Exam Location:  [facility] Procedure: 2D Echo, Cardiac Doppler and Color Doppler Indications:    Congestive Heart Failure 428.0 / I50.9  History:        Patient has prior history of Echocardiogram examinations, most                 recent 07/03/2015. CHF; Risk Factors:Hypertension and Diabetes.                 Breast cancer of upper-outer quadrant of right female                 breast,Hearing loss - has hearing aides, follow by                 audiologist,Obesity,Obstructive sleep apnea.  Sonographer:    Alvino Chapel RCS Referring Phys: [phone] [place]  1. Left ventricular ejection fraction, by estimation, is 60 to 65%. The left ventricle has normal function. The left ventricle has no regional wall motion abnormalities. There is  mild to moderate left ventricular hypertrophy. Left ventricular diastolic parameters are consistent with Grade I diastolic dysfunction (impaired relaxation).  2. Right ventricular systolic function is normal. The right ventricular size is normal. Tricuspid regurgitation signal is inadequate for assessing PA pressure.  3. The mitral valve is grossly normal, there is mild to moderate annular calcification. Trivial mitral valve regurgitation.  4. The aortic valve was not well visualized. Mild aortic leaflet calcification. Aortic valve regurgitation is mild.  5. IVC dilated, but not able to estimate CVP (no sniff). FINDINGS  Left Ventricle: Left ventricular ejection fraction, by estimation, is 60 to 65%. The left ventricle has normal function. The left ventricle has no regional wall motion abnormalities. Definity contrast agent was given IV to delineate the left ventricular  endocardial borders. The left ventricular internal cavity size was normal in size. There is moderate left ventricular hypertrophy. Left ventricular diastolic parameters are consistent with Grade I diastolic dysfunction (impaired relaxation). Right Ventricle: The right ventricular size is normal. No increase in right ventricular wall thickness. Right ventricular systolic function is normal. Tricuspid regurgitation signal is inadequate for assessing PA pressure. Left Atrium: Left atrial size was normal in size. Right Atrium: Right atrial size was normal in size. Pericardium: There is no evidence of pericardial effusion. Presence of pericardial fat pad. Mitral Valve: The mitral valve is grossly normal. Mild to moderate mitral annular calcification. Trivial mitral valve regurgitation. Tricuspid Valve: The tricuspid valve is not well visualized. Tricuspid valve regurgitation is trivial. Aortic Valve: The aortic valve was not well visualized. Aortic valve regurgitation is mild. Aortic regurgitation PHT measures 560 msec. There is mild calcification of the  aortic valve. Pulmonic Valve: The pulmonic valve was grossly normal. Pulmonic valve regurgitation is trivial. Aorta: The aortic root is normal in size and structure. Venous: IVC dilated, but not able to estimate CVP (no sniff). IAS/Shunts: No atrial level shunt detected by color flow Doppler.  LEFT VENTRICLE PLAX 2D LVIDd:         3.40 cm  Diastology LVIDs:  2.34 cm  LV e' lateral:   10.00 cm/s LV PW:         1.13 cm  LV E/e' lateral: 6.1 LV IVS:        1.43 cm  LV e' medial:    5.11 cm/s LVOT diam:     1.70 cm  LV E/e' medial:  12.0 LV SV:         41.08 ml LV SV Index:   21.60 LVOT Area:     2.27 cm  RIGHT VENTRICLE RV S prime:     15.80 cm/s TAPSE (M-mode): 2.6 cm LEFT ATRIUM             Index LA diam:        1.65 cm 0.87 cm/m LA Vol (A2C):   57.3 ml 30.13 ml/m LA Vol (A4C):   54.1 ml 28.45 ml/m LA Biplane Vol: 56.6 ml 29.76 ml/m  AORTIC VALVE LVOT Vmax:   86.20 cm/s LVOT Vmean:  55.500 cm/s LVOT VTI:    0.181 m AI PHT:      560 msec  AORTA Ao Root diam: 3.30 cm MITRAL VALVE MV Area (PHT): 2.73 cm    SHUNTS MV Decel Time: 278 msec    Systemic VTI:  0.18 m MV E velocity: 61.30 cm/s  Systemic Diam: 1.70 cm MV A velocity: 99.00 cm/s MV E/A ratio:  0.62 Rozann Lesches MD Electronically signed by Rozann Lesches MD Signature Date/Time: 07/08/2019/1:55:20 PM    Final         Scheduled Meds: . amLODipine  5 mg Oral Daily  . carvedilol  3.125 mg Oral BID  . Chlorhexidine Gluconate Cloth  6 each Topical Daily  . enoxaparin (LOVENOX) injection  40 mg Subcutaneous QHS  . insulin aspart  0-9 Units Subcutaneous TID WC  . insulin glargine  10 Units Subcutaneous QHS  . ipratropium-albuterol  3 mL Nebulization TID  . mouth rinse  15 mL Mouth Rinse BID  . pramipexole  1 mg Oral BID  . sodium chloride flush  3 mL Intravenous Q12H   Continuous Infusions: . sodium chloride Stopped (07/09/19 1315)  . cefTRIAXone (ROCEPHIN)  IV Stopped (07/09/19 1312)     LOS: 1 day    Time spent:  22mins    Kathie Dike, MD [facility]   If 7PM-7AM, please contact night-coverage [email or web address]  07/09/2019, 6:08 PM

## 2019-07-10 ENCOUNTER — Telehealth: Payer: Self-pay | Admitting: *Deleted

## 2019-07-10 DIAGNOSIS — G2581 Restless legs syndrome: Secondary | ICD-10-CM

## 2019-07-10 LAB — BASIC METABOLIC PANEL
Anion gap: 8 (ref 5–15)
BUN: 16 mg/dL (ref 8–23)
CO2: 36 mmol/L — ABNORMAL HIGH (ref 22–32)
Calcium: 8.5 mg/dL — ABNORMAL LOW (ref 8.9–10.3)
Chloride: 92 mmol/L — ABNORMAL LOW (ref 98–111)
Creatinine, Ser: 0.57 mg/dL (ref 0.44–1.00)
GFR calc Af Amer: >60 mL/min (ref >60)
GFR calc non Af Amer: >60 mL/min (ref >60)
Glucose, Bld: 162 mg/dL — ABNORMAL HIGH (ref 70–99)
Potassium: 3.8 mmol/L (ref 3.5–5.1)
Sodium: 136 mmol/L (ref 135–145)

## 2019-07-10 LAB — BLOOD GAS, ARTERIAL
Acid-Base Excess: 12.5 mmol/L — ABNORMAL HIGH (ref 0.0–2.0)
Bicarbonate: 34.2 mmol/L — ABNORMAL HIGH (ref 20.0–28.0)
FIO2: 32
O2 Saturation: 93.5 %
Patient temperature: 37
pCO2 arterial: 68.5 mmHg (ref 32.0–48.0)
pH, Arterial: 7.366 (ref 7.350–7.450)
pO2, Arterial: 73.3 mmHg — ABNORMAL LOW (ref 83.0–108.0)

## 2019-07-10 LAB — GLUCOSE, CAPILLARY
Glucose-Capillary: 150 mg/dL — ABNORMAL HIGH (ref 70–99)
Glucose-Capillary: 229 mg/dL — ABNORMAL HIGH (ref 70–99)
Glucose-Capillary: 154 mg/dL — ABNORMAL HIGH (ref 70–99)
Glucose-Capillary: 204 mg/dL — ABNORMAL HIGH (ref 70–99)

## 2019-07-10 MED ORDER — METOPROLOL TARTRATE 5 MG/5ML IV SOLN
2.5000 mg | Freq: Once | INTRAVENOUS | Status: AC
  Administered 2019-07-10: 2.5 mg via INTRAVENOUS
  Filled 2019-07-10: qty 5

## 2019-07-10 MED ORDER — AMLODIPINE BESYLATE 5 MG PO TABS
10.0000 mg | ORAL_TABLET | Freq: Every day | ORAL | Status: DC
  Administered 2019-07-10 – 2019-07-11 (×2): 10 mg via ORAL
  Filled 2019-07-10 (×2): qty 2

## 2019-07-10 NOTE — Progress Notes (Signed)
Patient only wore BiPAP briefly maybe 2 hrs.

## 2019-07-10 NOTE — Progress Notes (Signed)
PROGRESS NOTE    [name]  [id number] DOB: 1940-09-20 DOA: 07/07/2019 PCP: Brunetta Jeans, PA-C    Brief Narrative:  79 year old female with a history of hypertension, diastolic heart failure, diabetes, was brought to the hospital when she was at her ophthalmologist today was noted to be hypoxic on room air with an O2 saturation of 81%.  She brought to the emergency room where chest x-ray indicated possible volume overload.  She received a dose of IV Lasix and was referred for admission.  He was noted that she has been increasingly somnolent lately.  ABG performed showed elevated PCO2 and evidence of respiratory acidosis.  Patient was subsequently transferred to the stepdown unit and started on BiPAP--improving now off BiPAP mostly   Assessment & Plan:   Active Problems:   RESTLESS LEGS SYNDROME   Essential hypertension   Type 2 diabetes mellitus with diabetic neuropathy (HCC)   Hypoxia   Acute respiratory failure with hypercapnia (HCC)   Acute metabolic encephalopathy   Acute on chronic diastolic CHF (congestive heart failure) (HCC)   1)Acute on respiratory failure with hypoxia and hypercapnia.  Blood gas shows elevated PCO2 with uncompensated respiratory acidosis.  Patient was transferred to stepdown unit and started on BiPAP.  Follow-up ABG showed improving PCO2.  She does have a history of sleep apnea, but is noncompliant with CPAP.  --Continue to encourage compliance with BiPAP   2)Acute metabolic encephalopathy--  Patient was increasingly drowsy related to hypercapnia.  Overall mental status is improving with BiPAP therapy-----compliance with BiPAP advised  3)Possible UTI.  Urinalysis indicates possible infection.  --- Continue IV ceftriaxone pending urine culture results    4)Acute on chronic diastolic congestive heart failure.  Patient was receiving IV Lasix.  Overall volume status appears to have improved.  Currently, she appears to be euvolemic.  -Continue oral  Lasix  5)Diabetes-  Hold Metformin.  Continue sliding scale insulin and lantus.  Blood sugars have been stable  6)Restless leg syndrome. Continue mirapex  7)HTN.  Blood pressure stable.  Continue on amlodipine and coreg   DVT prophylaxis: lovenox Code Status: full code Family Communication: discussed with husband at bedside Disposition Plan:  .  Transfer to telemetry from stepdown , possible discharge home in 1 to 2 days if respiratory and mental status continues to improve    Consultants:     Procedures:   Echo  Antimicrobials:   Ceftriaxone 2/21 >   Subjective: -More coherent with BiPAP use, less confused, dyspnea on exertion persist, hypoxia persist  Objective: Vitals:   07/10/19 1145 07/10/19 1400 07/10/19 1434 07/10/19 1655  BP:  (!) 164/105    Pulse: 70 71  75  Resp: 19 (!) 25    Temp: 97.7 F (36.5 C)   (!) 97.4 F (36.3 C)  TempSrc: Oral   Oral  SpO2: 95% 97% 93% 90%  Weight:      Height:        Intake/Output Summary (Last 24 hours) at 07/10/2019 1833 Last data filed at 07/10/2019 1505 Gross per 24 hour  Intake 3 ml  Output 1300 ml  Net -1297 ml   Filed Weights   07/07/19 1131 07/07/19 2242 07/10/19 0420  Weight: 96.6 kg 94.8 kg 95.5 kg    Examination:  General exam: Alert, awake, oriented x 3, dyspnea on exertion persist Respiratory system: Diminished breath sounds bilaterally.  Increased respiratory effort. Cardiovascular system:RRR. No murmurs, rubs, gallops. Gastrointestinal system: Abdomen is nondistended, soft and nontender. . Normal bowel sounds  heard. Central nervous system: Alert and oriented. No focal neurological deficits. Extremities: No C/C/E, +pedal pulses Skin: No rashes, lesions or ulcers Psychiatry: Judgement and insight appear normal. Mood & affect appropriate.     Data Reviewed:    CBC: Recent Labs  Lab 07/07/19 1219 07/09/19 0448  WBC 10.4 9.7  NEUTROABS 7.8*  --   HGB 13.9 13.1  HCT 45.5 43.1  MCV 98.7 100.5*   PLT 270 [id number]   Basic Metabolic Panel: Recent Labs  Lab 07/05/19 1422 07/07/19 1219 07/09/19 0448 07/10/19 0408  NA 141 140 137 136  K 3.6 3.6 3.5 3.8  CL 98 97* 90* 92*  CO2 32 33* 40* 36*  GLUCOSE 125* 132* 127* 162*  BUN 16 18 19 16   CREATININE 0.51 0.62 0.75 0.57  CALCIUM 8.7* 8.8* 8.4* 8.5*   GFR: Estimated Creatinine Clearance: 58 mL/min (by C-G formula based on SCr of 0.57 mg/dL). Liver Function Tests: No results for input(s): AST, ALT, ALKPHOS, BILITOT, PROT, ALBUMIN in the last 168 hours. No results for input(s): LIPASE, AMYLASE in the last 168 hours. Recent Labs  Lab 07/08/19 1302  AMMONIA 18   Coagulation Profile: No results for input(s): INR, PROTIME in the last 168 hours. Cardiac Enzymes: No results for input(s): CKTOTAL, CKMB, CKMBINDEX, TROPONINI in the last 168 hours. BNP (last 3 results) No results for input(s): PROBNP in the last 8760 hours. HbA1C: No results for input(s): HGBA1C in the last 72 hours. CBG: Recent Labs  Lab 07/09/19 1624 07/09/19 2214 07/10/19 0715 07/10/19 1143 07/10/19 1654  GLUCAP 177* 207* 150* 229* 154*   Lipid Profile: No results for input(s): CHOL, HDL, LDLCALC, TRIG, CHOLHDL, LDLDIRECT in the last 72 hours. Thyroid Function Tests: Recent Labs    07/08/19 1302  TSH 0.902   Anemia Panel: No results for input(s): VITAMINB12, FOLATE, FERRITIN, TIBC, IRON, RETICCTPCT in the last 72 hours. Sepsis Labs: No results for input(s): PROCALCITON, LATICACIDVEN in the last 168 hours.  Recent Results (from the past 240 hour(s))  SARS CORONAVIRUS 2 (TAT 6-24 HRS) Nasopharyngeal Nasopharyngeal Swab     Status: None   Collection Time: 07/05/19  7:15 AM   Specimen: Nasopharyngeal Swab  Result Value Ref Range Status   SARS Coronavirus 2 NEGATIVE NEGATIVE Final    Comment: (NOTE) SARS-CoV-2 target nucleic acids are NOT DETECTED. The SARS-CoV-2 RNA is generally detectable in upper and lower respiratory specimens during the acute  phase of infection. Negative results do not preclude SARS-CoV-2 infection, do not rule out co-infections with other pathogens, and should not be used as the sole basis for treatment or other patient management decisions. Negative results must be combined with clinical observations, patient history, and epidemiological information. The expected result is Negative. Fact Sheet for Patients: SugarRoll.be Fact Sheet for Healthcare Providers: https://www.woods-mathews.com/ This test is not yet approved or cleared by the [place] FDA and  has been authorized for detection and/or diagnosis of SARS-CoV-2 by FDA under an Emergency Use Authorization (EUA). This EUA will remain  in effect (meaning this test can be used) for the duration of the COVID-19 declaration under Section 56 4(b)(1) of the Act, 21 U.S.C. section 360bbb-3(b)(1), unless the authorization is terminated or revoked sooner. Performed at [facility], [place] [address], [place], [place] [address]   MRSA PCR Screening     Status: None   Collection Time: 07/08/19 10:49 PM   Specimen: Nasal Mucosa; Nasopharyngeal  Result Value Ref Range Status   MRSA by PCR NEGATIVE  NEGATIVE Final    Comment:        The GeneXpert MRSA Assay (FDA approved for NASAL specimens only), is one component of a comprehensive MRSA colonization surveillance program. It is not intended to diagnose MRSA infection nor to guide or monitor treatment for MRSA infections. Performed at [facility], [address] [place], [place] [address]          Radiology Studies: DG CHEST PORT 1 VIEW  Result Date: 07/09/2019 CLINICAL DATA:  Shortness of breath. EXAM: PORTABLE CHEST 1 VIEW COMPARISON:  07/07/2019 FINDINGS: Patient is rotated to the left. Stable elevation left hemidiaphragm. Subtle patchy hazy density over the left base which may be due to atelectasis or infection. Stable cardiomegaly. Remainder the exam  is unchanged. IMPRESSION: Subtle patchy density left base which may be due to atelectasis or infection. Stable cardiomegaly. Electronically Signed   By: Marin Olp M.D.   On: 07/09/2019 12:11        Scheduled Meds: . amLODipine  10 mg Oral Daily  . carvedilol  3.125 mg Oral BID  . Chlorhexidine Gluconate Cloth  6 each Topical Daily  . enoxaparin (LOVENOX) injection  40 mg Subcutaneous QHS  . insulin aspart  0-9 Units Subcutaneous TID WC  . insulin glargine  10 Units Subcutaneous QHS  . ipratropium-albuterol  3 mL Nebulization TID  . mouth rinse  15 mL Mouth Rinse BID  . pramipexole  1 mg Oral BID  . sodium chloride flush  3 mL Intravenous Q12H   Continuous Infusions: . sodium chloride Stopped (07/09/19 1315)  . cefTRIAXone (ROCEPHIN)  IV 1 g (07/10/19 1151)     LOS: 2 days    Roxan Hockey, MD [facility]   If 7PM-7AM, please contact night-coverage [email or web address]  07/10/2019, 6:33 PM

## 2019-07-10 NOTE — Progress Notes (Signed)
Dr. Olevia Bowens notified of critical lab:  PC02 68.5.

## 2019-07-10 NOTE — TOC Initial Note (Signed)
Transition of Care [facility]) - Initial/Assessment Note    Patient Details  Name: [name] MRN: [id number] Date of Birth: Nov 19, 1940  [facility] CM/SW Contact:    Boneta Lucks, RN Phone Number: 07/10/2019, 2:54 PM  Clinical Narrative:        Patient admitted with Hypoxia. Lives at home with her spouse.  PT is recommending HHPT.  Choices with HH that accepts insurance.  Tim with [facility] accepted the referral. TOC to follow.            Expected Discharge Plan: [place] Barriers to Discharge: Continued Medical Work up   Patient Goals and CMS Choice Patient states their goals for this hospitalization and ongoing recovery are:: to go home. CMS Medicare.gov Compare Post Acute Care list provided to:: Patient Choice offered to / list presented to : Patient  Expected Discharge Plan and Services Expected Discharge Plan: [place]       Living arrangements for the past 2 months: Single Family Home      HH Arranged: PT   Date [place]: 07/10/19 Time [place] Agency Contacted: [phone] Representative spoke with at [place]: Richrd Prime  Prior Living Arrangements/Services Living arrangements for the past 2 months: [place] Lives with:: Spouse   Do you feel safe going back to the place where you live?: Yes      Need for Family Participation in Patient Care: Yes (Comment) Care giver support system in place?: Yes (comment)   Criminal Activity/Legal Involvement Pertinent to Current Situation/Hospitalization: No - Comment as needed  Activities of Daily Living Home Assistive Devices/Equipment: Walker (specify type) ADL Screening (condition at time of admission) Patient's cognitive ability adequate to safely complete daily activities?: Yes Is the patient deaf or have difficulty hearing?: Yes Does the patient have difficulty seeing, even when wearing glasses/contacts?: No Does the patient have difficulty concentrating,  remembering, or making decisions?: No Patient able to express need for assistance with ADLs?: Yes Does the patient have difficulty dressing or bathing?: No Independently performs ADLs?: Yes (appropriate for developmental age) Does the patient have difficulty walking or climbing stairs?: Yes Weakness of Legs: Both Weakness of Arms/Hands: None  Permission Sought/Granted       Emotional Assessment    Alcohol / Substance Use: Not Applicable Psych Involvement: No (comment)  Admission diagnosis:  Hypoxia [R09.02] Acute on chronic congestive heart failure, unspecified heart failure type ([place]) [I50.9] Acute respiratory failure with hypercapnia ([place]) [J96.02] Patient Active Problem List   Diagnosis Date Noted  . Acute respiratory failure with hypercapnia ([place]) 07/08/2019  . Acute metabolic encephalopathy [id number]  . Acute on chronic diastolic CHF (congestive heart failure) ([place]) 07/08/2019  . Hypoxia 07/07/2019  . Hypokalemia 05/03/2019  . Facial asymmetry, acquired 08/20/2016  . Fracture of knee prosthesis ([place]) 08/01/2016  . Obesity 02/20/2016  . Chronic pain syndrome 02/19/2016  . Mild episode of recurrent major depressive disorder ([place]) 08/02/2015  . Chronic diastolic congestive heart failure ([place]) 07/16/2015  . Type 2 diabetes mellitus with diabetic neuropathy ([place]) 09/27/2014  . Hearing loss - has hearing aides, follow by audiologist 10/11/2013  . No blood products ([place])  09/11/2013  . Disorder of bone and cartilage 06/10/2010  . Breast cancer of upper-outer quadrant of right female breast ([place]) 04/04/2010  . RESTLESS LEGS SYNDROME 05/05/2007  . Obstructive sleep apnea 03/01/2007  . Essential hypertension 01/27/2007  . Osteoarthritis 01/27/2007   PCP:  Brunetta Jeans, PA-C Pharmacy:  [facility] - [place], [place] [address] [place] [address] Phone: [phone] Fax: [place] [address] [place] [address] Phone: [phone] Fax: [phone]

## 2019-07-10 NOTE — Telephone Encounter (Signed)
If the hospitalist and hospital PT recommend a SNF for rehabilitation then that is the best thing to get her back up on her feet. If he does not want her to go there her will need to tell them that he would like her to do PT at home and they will set this up for them at discharge.

## 2019-07-10 NOTE — Telephone Encounter (Signed)
Unable to contact pt husband Sang Gentry. Voice mail is full

## 2019-07-10 NOTE — Progress Notes (Signed)
Physical Therapy Treatment Patient Details Name: [name] MRN: [id number] DOB: [birth year] Today's Date: 07/10/2019    History of Present Illness [name] is a 79 y.o. female with medical history significant for hypertension, diastolic CHF, diabetes mellitus.  Patient was sent to the ED from ophthalmologist office with reports of low oxygen level.Patient saw her ophthalmologist today for a planned cataract extraction, but incidentally patient's O2 sats where 81 % on room air.  Per patient spouse who is also at bedside, he tells me patient uses oxygen at home sometimes, not every night, only if she feels she needs it.    PT Comments    Patient able to transfer to commode, had difficulty for sit to stand from commode due to BLE weakness, demonstrated increased endurance/distance for ambulation in room and hallway without loss of balance, limited mostly due to c/o right foot pain and tolerated sitting up in chair with her spouse present in room after therapy.  Patient will benefit from continued physical therapy in hospital and recommended venue below to increase strength, balance, endurance for safe ADLs and gait.    Follow Up Recommendations  Home health PT;Supervision for mobility/OOB;Supervision - Intermittent     Equipment Recommendations  None recommended by PT    Recommendations for Other Services       Precautions / Restrictions Precautions Precautions: Fall Restrictions Weight Bearing Restrictions: No    Mobility  Bed Mobility Overal bed mobility: Needs Assistance Bed Mobility: Supine to Sit     Supine to sit: Supervision     General bed mobility comments: Increased time  Transfers Overall transfer level: Needs assistance Equipment used: Rolling walker (2 wheeled) Transfers: Sit to/from [employer] Transfers Sit to Stand: Supervision;Min guard Stand pivot transfers: Supervision;Min guard       General transfer comment: increased time, labored  movement  Ambulation/Gait Ambulation/Gait assistance: Supervision;Min guard Gait Distance (Feet): 40 Feet Assistive device: Rolling walker (2 wheeled) Gait Pattern/deviations: Step-through pattern;Shuffle;Decreased step length - left;Decreased stance time - right;Decreased stride length Gait velocity: Decreased   General Gait Details: slow slightly labored cadence while on 4 LPM O2 with SpO2 at 93-95%, no loss of balance, limited mostly due to c/o right foot pain   Stairs             Wheelchair Mobility    Modified Rankin (Stroke Patients Only)       Balance Overall balance assessment: Needs assistance Sitting-balance support: Feet supported;No upper extremity supported Sitting balance-Leahy Scale: Good Sitting balance - Comments: Good sitting balance at EOB   Standing balance support: During functional activity;Single extremity supported Standing balance-Leahy Scale: Poor Standing balance comment: fair using RW                            Cognition Arousal/Alertness: Awake/alert Behavior During Therapy: WFL for tasks assessed/performed Overall Cognitive Status: Within Functional Limits for tasks assessed                                        Exercises      General Comments        Pertinent Vitals/Pain Pain Assessment: Faces Faces Pain Scale: Hurts little more Pain Location: right foot when walking Pain Descriptors / Indicators: Discomfort;Grimacing;Sore Pain Intervention(s): Limited activity within patient's tolerance;Monitored during session;Repositioned    Home Living Family/patient expects to be discharged to::  Private residence Living Arrangements: Spouse/significant other Available Help at Discharge: Family;Available 24 hours/day Type of Home: Other(Comment) Home Access: Ramped entrance   Home Layout: One level Home Equipment: Bedside commode;Shower seat;Walker - 4 wheels;Cane - single point      Prior Function  Level of Independence: Independent with assistive device(s)  Gait / Transfers Assistance Needed: household ambulator using SPC or leaning on furniture/walls, uses rollator for longer distances ADL's / Homemaking Assistance Needed: assisted by spouse     PT Goals (current goals can now be found in the care plan section) Acute Rehab PT Goals Patient Stated Goal: Return home PT Goal Formulation: With patient Time For Goal Achievement: 07/15/19 Potential to Achieve Goals: Good Progress towards PT goals: Progressing toward goals    Frequency    Min 3X/week      PT Plan Current plan remains appropriate    Co-evaluation              AM-PAC PT "6 Clicks" Mobility   Outcome Measure  Help needed turning from your back to your side while in a flat bed without using bedrails?: None Help needed moving from lying on your back to sitting on the side of a flat bed without using bedrails?: A Little Help needed moving to and from a bed to a chair (including a wheelchair)?: A Little Help needed standing up from a chair using your arms (e.g., wheelchair or bedside chair)?: A Little Help needed to walk in hospital room?: A Little Help needed climbing 3-5 steps with a railing? : A Lot 6 Click Score: 18    End of Session Equipment Utilized During Treatment: Oxygen Activity Tolerance: Patient tolerated treatment well Patient left: in chair;with chair alarm set Nurse Communication: Mobility status;Other (comment) PT Visit Diagnosis: Unsteadiness on feet (R26.81);Other abnormalities of gait and mobility (R26.89);Muscle weakness (generalized) (M62.81);History of falling (Z91.81);Difficulty in walking, not elsewhere classified (R26.2)     Time: [id number] PT Time Calculation (min) (ACUTE ONLY): 31 min  Charges:  $Therapeutic Activity: 23-37 mins                     12:32 PM, 07/10/19 Lonell Grandchild, MPT Physical Therapist with [facility] [phone] office [phone]  mobile phone

## 2019-07-10 NOTE — Progress Notes (Signed)
Dr. Olevia Bowens notified of B/P 186/72 MAP 101. HR 68, Sp02 95

## 2019-07-10 NOTE — Telephone Encounter (Signed)
[place]  Pt husband will like to know how can he do for pt to continue therapy at home. Pt is in the hospital now, he is not sure when she is going to be discharge, Do not want for her to go to a advance care.

## 2019-07-11 ENCOUNTER — Telehealth: Payer: Self-pay | Admitting: Cardiology

## 2019-07-11 DIAGNOSIS — J9611 Chronic respiratory failure with hypoxia: Secondary | ICD-10-CM | POA: Diagnosis present

## 2019-07-11 DIAGNOSIS — J9612 Chronic respiratory failure with hypercapnia: Secondary | ICD-10-CM | POA: Diagnosis present

## 2019-07-11 DIAGNOSIS — J9622 Acute and chronic respiratory failure with hypercapnia: Secondary | ICD-10-CM

## 2019-07-11 DIAGNOSIS — J9621 Acute and chronic respiratory failure with hypoxia: Secondary | ICD-10-CM

## 2019-07-11 LAB — GLUCOSE, CAPILLARY
Glucose-Capillary: 134 mg/dL — ABNORMAL HIGH (ref 70–99)
Glucose-Capillary: 218 mg/dL — ABNORMAL HIGH (ref 70–99)

## 2019-07-11 LAB — URINE CULTURE: Culture: >=100000 — AB

## 2019-07-11 MED ORDER — METFORMIN HCL 1000 MG PO TABS: 1000.0000 mg | ORAL_TABLET | Freq: Two times a day (BID) | ORAL | 2 refills | Status: DC

## 2019-07-11 MED ORDER — PREDNISONE 20 MG PO TABS: 20.0000 mg | ORAL_TABLET | Freq: Every day | ORAL | 0 refills | Status: DC

## 2019-07-11 MED ORDER — LISINOPRIL 20 MG PO TABS: 20.0000 mg | ORAL_TABLET | Freq: Every day | ORAL | 2 refills | Status: DC

## 2019-07-11 MED ORDER — ALBUTEROL SULFATE (2.5 MG/3ML) 0.083% IN NEBU: 2.5000 mg | INHALATION_SOLUTION | Freq: Four times a day (QID) | RESPIRATORY_TRACT | 12 refills | Status: DC | PRN

## 2019-07-11 MED ORDER — CEPHALEXIN 500 MG PO CAPS: 500.0000 mg | ORAL_CAPSULE | Freq: Three times a day (TID) | ORAL | 0 refills | Status: AC

## 2019-07-11 MED ORDER — CARVEDILOL 3.125 MG PO TABS: 3.1250 mg | ORAL_TABLET | Freq: Two times a day (BID) | ORAL | 2 refills | Status: DC

## 2019-07-11 MED ORDER — LANTUS SOLOSTAR 100 UNIT/ML ~~LOC~~ SOPN: 25.0000 [IU] | PEN_INJECTOR | Freq: Every day | SUBCUTANEOUS | 1 refills | Status: DC

## 2019-07-11 MED ORDER — AMLODIPINE BESYLATE 10 MG PO TABS: 10.0000 mg | ORAL_TABLET | Freq: Every day | ORAL | 3 refills | Status: DC

## 2019-07-11 MED ORDER — FUROSEMIDE 40 MG PO TABS: 40.0000 mg | ORAL_TABLET | Freq: Every day | ORAL | 1 refills | Status: DC

## 2019-07-11 MED ORDER — ACETAMINOPHEN 325 MG PO TABS: 650.0000 mg | ORAL_TABLET | Freq: Four times a day (QID) | ORAL | 0 refills | Status: DC | PRN

## 2019-07-11 MED ORDER — GABAPENTIN 100 MG PO CAPS: 100.0000 mg | ORAL_CAPSULE | Freq: Two times a day (BID) | ORAL | 3 refills | Status: DC

## 2019-07-11 NOTE — Discharge Instructions (Signed)
1)Very low-salt diet advised 2)Weigh yourself daily, call if you gain more than 3 pounds in 1 day or more than 5 pounds in 1 week as your diuretic medications may need to be adjusted 3)Limit your Fluid  intake to no more than 60 ounces (1.8 Liters) per day 4) please use oxygen via nasal cannula at 3 L/min continuously day and night 5) you need to follow-up with pulmonologist at [facility] pulmonology clinic in [place] in order to have a sleep study scheduled for you 6) you will need a sleep study in order to get a new CPAP machine 7)Outpatient follow-up with your cardiologist Dr. Percival Spanish advised over the next couple weeks for recheck and reevaluation 8)Dr. Kara Mead ----Pulmonologist--at [facility]: [address], [place], [place] [address], ---Phone: [phone] --appointment has been made for you for Tuesday March 9th at  1515 PM

## 2019-07-11 NOTE — Progress Notes (Signed)
Physical Therapy Treatment Patient Details Name: [name] MRN: [id number] DOB: 04/09/1941 Today's Date: 07/11/2019    History of Present Illness [name] is a 79 y.o. female with medical history significant for hypertension, diastolic CHF, diabetes mellitus.  Patient was sent to the ED from ophthalmologist office with reports of low oxygen level.Patient saw her ophthalmologist today for a planned cataract extraction, but incidentally patient's O2 sats where 81 % on room air.  Per patient spouse who is also at bedside, he tells me patient uses oxygen at home sometimes, not every night, only if she feels she needs it.    PT Comments    Pt requires occasional verbal cues for hand placement to improve mobility and min assist to manage O2 line and prevent tripping. Trialed RA with OOB activity, but pt desaturated to 88% so returned 3LPM O2. No loss of balance with ambulation and mild SOB with therapeutic exercises. Pt requested to return to supine at EOS despite offer for bedside chair; call bell in hand and bed alarm on. Patient will benefit from continued physical therapy in hospital and recommended venue below to increase strength, balance, endurance for safe ADLs and gait.    Follow Up Recommendations  Home health PT;Supervision for mobility/OOB;Supervision - Intermittent     Equipment Recommendations  None recommended by PT    Recommendations for Other Services       Precautions / Restrictions Precautions Precautions: Fall Restrictions Weight Bearing Restrictions: No    Mobility  Bed Mobility Overal bed mobility: Needs Assistance Bed Mobility: Sit to Supine     Supine to sit: (seated EOB upon arrival) Sit to supine: Supervision   General bed mobility comments: increased time, verbal cues for hand placement  Transfers Overall transfer level: Needs assistance Equipment used: Rolling walker (2 wheeled) Transfers: Sit to/from [facility] Sit to Stand:  Supervision Stand pivot transfers: Supervision       General transfer comment: verbal cues to push from EOB to rise  Ambulation/Gait Ambulation/Gait assistance: Supervision Gait Distance (Feet): 30 Feet Assistive device: Rolling walker (2 wheeled) Gait Pattern/deviations: Step-through pattern;Decreased step length - left;Decreased stance time - right;Decreased stride length Gait velocity: decreased   General Gait Details: maintains body within RW frame, decreased bil foot clearance without loss of balance or falls, limited by R foot pain, on 3LPM O2 with O2 sat 98%, trialed RA but desat to 88% so returned O2   Stairs             Wheelchair Mobility    Modified Rankin (Stroke Patients Only)       Balance Overall balance assessment: Needs assistance Sitting-balance support: Feet supported;No upper extremity supported Sitting balance-Leahy Scale: Good Sitting balance - Comments: seated EOB   Standing balance support: During functional activity;Bilateral upper extremity supported Standing balance-Leahy Scale: Fair Standing balance comment: with RW         Cognition Arousal/Alertness: Awake/alert Behavior During Therapy: WFL for tasks assessed/performed Overall Cognitive Status: Within Functional Limits for tasks assessed                   Exercises General Exercises - Lower Extremity Long Arc Quad: Seated;Both;10 reps Hip Flexion/Marching: Standing;Both;10 reps    General Comments        Pertinent Vitals/Pain Pain Assessment: 0-10 Pain Score: 5  Pain Location: R foot Pain Descriptors / Indicators: Aching;Sore Pain Intervention(s): Limited activity within patient's tolerance;Monitored during session;Repositioned    Home Living  Prior Function            PT Goals (current goals can now be found in the care plan section) Acute Rehab PT Goals Patient Stated Goal: Return home PT Goal Formulation: With patient Time For Goal  Achievement: 07/15/19 Potential to Achieve Goals: Good Progress towards PT goals: Progressing toward goals    Frequency    Min 3X/week      PT Plan Current plan remains appropriate    Co-evaluation              AM-PAC PT "6 Clicks" Mobility   Outcome Measure  Help needed turning from your back to your side while in a flat bed without using bedrails?: None Help needed moving from lying on your back to sitting on the side of a flat bed without using bedrails?: A Little Help needed moving to and from a bed to a chair (including a wheelchair)?: A Little Help needed standing up from a chair using your arms (e.g., wheelchair or bedside chair)?: A Little Help needed to walk in hospital room?: A Little Help needed climbing 3-5 steps with a railing? : A Lot 6 Click Score: 18    End of Session Equipment Utilized During Treatment: Gait belt;Oxygen Activity Tolerance: Patient tolerated treatment well Patient left: in bed;with call bell/phone within reach;with bed alarm set Nurse Communication: Mobility status PT Visit Diagnosis: Unsteadiness on feet (R26.81);Other abnormalities of gait and mobility (R26.89);Muscle weakness (generalized) (M62.81);History of falling (Z91.81);Difficulty in walking, not elsewhere classified (R26.2)     Time: 1005-1030 PT Time Calculation (min) (ACUTE ONLY): 25 min  Charges:  $Gait Training: 8-22 mins $Therapeutic Exercise: 8-22 mins                      Tori Anissia Wessells PT, DPT 07/11/19, 11:43 AM [phone]

## 2019-07-11 NOTE — TOC Transition Note (Signed)
Transition of Care [facility]) - CM/SW Discharge Note   Patient Details  Name: [name] MRN: [id number] Date of Birth: 11/15/1940  [facility]) CM/SW Contact:  Boneta Lucks, RN Phone Number: 07/11/2019, 1:29 PM   Clinical Narrative:   Patient discharging home. Updated Tim with [facility].  New orders for continuous oxygen and neb machine.  Per Husband- [name] they use [employer]. Called [facility], review and complete the orders.      Final next level of care: [place] Barriers to Discharge: Barriers Resolved   Patient Goals and CMS Choice Patient states their goals for this hospitalization and ongoing recovery are:: to go home. CMS Medicare.gov Compare Post Acute Care list provided to:: Patient Choice offered to / list presented to : Patient  Discharge Placement        Name of family member notified: [name] - husband Patient and family notified of of transfer: 07/11/19  Discharge Plan and Services                  DME Agency: [employer] Date DME Agency Contacted: 07/11/19 Time DME Agency Contacted: [phone] Representative spoke with at DME Agency: [facility] Arranged: PT   Date [place]: 07/10/19 Time [place]: 1453 Representative spoke with at [place]: Richrd Prime

## 2019-07-11 NOTE — Progress Notes (Signed)
IV removed and discharge instructions reviewed.  Scripts sent to pharmacy and instructions for follow up appts with pulmonology and cardiology on paperwork. To be on 3 liters at home and have home pt.  Husband and patient made aware.  Transported by wc to car. Husband to drive home

## 2019-07-11 NOTE — Telephone Encounter (Signed)
Spoke with patient's daughter [name]. She reports patient was admitted to the hospital on the 19th. She went for cataract surgery and when they checked her oxygen saturation it was low so they called 911 and patient was sent to [facility].   Daughter wants Dr. Percival Spanish involved. At this time no cardiac consultation has been done. Spoke with the nurse Izora Gala who will message the hospitalist for a consult order. Will route to Dr. Percival Spanish so that he is aware.

## 2019-07-11 NOTE — Telephone Encounter (Signed)
New message:    Patient daughter calling stating that her mother is in the hospital. She would like for some one to call her.

## 2019-07-11 NOTE — Progress Notes (Signed)
SATURATION QUALIFICATIONS: (Thisnote is usedto comply with regulatory documentation for home oxygen)  Patient Saturations on Room Air at Rest =81%  Patient Saturations on Room Air while Ambulating =81 %  Patient Saturations on3Liters of oxygen while Ambulating = 93 %     Patient needs continuous O2 at 3 L/min continuously via nasal cannula with humidifier, with gaseous portability and conserving device  Diagnosis--congestive heart failure and chronic hypoxic and hypercapnic respiratory failure  -Generalized weakness and deconditioning-history of  Noncompliance  Roxan Hockey, MD

## 2019-07-11 NOTE — Discharge Summary (Signed)
[name], is a 79 y.o. female  DOB [birth year]  MRN [id number].  Admission date:  07/07/2019  Admitting Physician  Kathie Dike, MD  Discharge Date:  07/11/2019   Primary MD  Brunetta Jeans, PA-C  Recommendations for primary care physician for things to follow:   1)Very low-salt diet advised 2)Weigh yourself daily, call if you gain more than 3 pounds in 1 day or more than 5 pounds in 1 week as your diuretic medications may need to be adjusted 3)Limit your Fluid  intake to no more than 60 ounces (1.8 Liters) per day 4) please use oxygen via nasal cannula at 3 L/min continuously day and night 5) you need to follow-up with pulmonologist at [facility] pulmonology clinic in [place] in order to have a sleep study scheduled for you 6) you will need a sleep study in order to get a new CPAP machine 7)Outpatient follow-up with your cardiologist Dr. Percival Spanish advised over the next couple weeks for recheck and reevaluation 8)Dr. Kara Mead ----Pulmonologist--at [facility]: [address], [place], [place] [address], ---Phone: [phone] --appointment has been made for you for Tuesday March 9th at  1515 PM  Admission Diagnosis  Hypoxia [R09.02] Acute on chronic congestive heart failure, unspecified heart failure type ([place]) [I50.9] Acute respiratory failure with hypercapnia ([place]) [J96.02]   Discharge Diagnosis  Hypoxia [R09.02] Acute on chronic congestive heart failure, unspecified heart failure type ([place]) [I50.9] Acute respiratory failure with hypercapnia ([place]) [J96.02]    Principal Problem:   Acute on chronic respiratory failure with hypoxia and hypercapnia (HCC) Active Problems:   Obstructive sleep apnea   Acute respiratory failure with hypercapnia (HCC)   Acute metabolic encephalopathy   Acute on chronic diastolic CHF (congestive heart failure) ([place])   Type 2  diabetes mellitus with diabetic neuropathy ([place])   RESTLESS LEGS SYNDROME   Essential hypertension   Hypoxia      Past Medical History:  Diagnosis Date  . AK (actinic keratosis)   . Anxiety    occasional  . Breast cancer ([place])    R breast, s/p radiation 34Gy/25f 04/29/10-05/05/10  . CTS (carpal tunnel syndrome)    Left  . Diabetes ([place]   . Diastolic [place]F ([place]    Dx w/ hospitalization 06/2015 for respiratory failure  . Facial asymmetry, acquired 08/20/2016   From injury  . Falls frequently    "can not pick left leg up"  . Hearing loss    bilateral hearing aides  . History of environmental allergies   . History of kidney stones 20 years ago  . Hypertension   . MDD (major depressive disorder)   . No blood products ([place])  09/11/2013  . Osteoarthritis    hx shoulder, knee, back, wrist pain; saw Dr. AWynelle Link  . Personal history of radiation therapy   . RESTLESS LEGS SYNDROME 05/05/2007   Qualifier: Diagnosis of  By: CGwenette GreetMD, KArmando Reichert  . RLS (restless legs syndrome)   . Sleep apnea  Past Surgical History:  Procedure Laterality Date  . APPENDECTOMY  age 36  . BREAST LUMPECTOMY Right 2012  . BREAST SURGERY  2012   rt breast lumpectomy  . flex signoidoscopy    . INCISION AND DRAINAGE PERIRECTAL ABSCESS    . JOINT REPLACEMENT Right 2006   knee  . TOTAL KNEE ARTHROPLASTY Left 08/06/2014   Procedure: LEFT TOTAL KNEE ARTHROPLASTY;  Surgeon: Gaynelle Arabian, MD;  Location: [facility];  Service: Orthopedics;  Laterality: Left;  . TUBAL LIGATION  1979     HPI  from the history and physical done on the day of admission:    Chief Complaint: Low oxygen level.  HPI: [name] is a 79 y.o. female with medical history significant for hypertension, diastolic CHF, diabetes mellitus.  Patient was sent to the ED from ophthalmologist office with reports of low oxygen level. Patient saw her ophthalmologist today for a planned cataract extraction, but incidentally patient's  O2 sats where 81 % on room air.  Per patient spouse who is also at bedside, he tells me patient uses oxygen at home sometimes, not every night, only if she feels she needs it. Patient denies chest pain.  She has no difficulty breathing.  She takes Lasix 40 mg daily.  She has no leg swelling. Patient tested negative for COVID-19 2 days ago.   ED Course: O2 sats 86% drops to 84% with ambulation.  Heart rate 60s to 70s.  Blood pressure systolic [id number].  BNP unremarkable at 53. Hs T 5 > 4.  EKG showed sinus rhythm, without significant change from prior.  Portable chest x-ray showed central venous congestion with low lung volumes.  IV Lasix 60 mg x 1 given in the ED.  Hospitalist to admit for further evaluation and management. On attempting to ambulate patient in the ED, she tripped over her legs, fell to her knees and left hip. She was able to stand and work back to her room. Patient reported she uses a walker at home to ambulate.  Review of Systems: As per HPI all other systems reviewed and negative.   Hospital Course:    Brief Narrative:  79 year old female with a history of hypertension, diastolic heart failure, diabetes, was brought to the hospital when she was at her ophthalmologist today was noted to be hypoxic on room air with an O2 saturation of 81%.  She brought to the emergency room where chest x-ray indicated possible volume overload.  She received a dose of IV Lasix and was referred for admission.  He was noted that she has been increasingly somnolent lately.  ABG performed showed elevated PCO2 and evidence of respiratory acidosis.  Patient was subsequently transferred to the stepdown unit and started on BiPAP--improving now off BiPAP mostly   Assessment & Plan:   Active Problems:   RESTLESS LEGS SYNDROME   Essential hypertension   Type 2 diabetes mellitus with diabetic neuropathy (HCC)   Hypoxia   Acute respiratory failure with hypercapnia (HCC)   Acute metabolic  encephalopathy   Acute on chronic diastolic CHF (congestive heart failure) (HCC)   1)Acute on respiratory failure with hypoxia and hypercapnia.  Blood gas shows elevated PCO2 with uncompensated respiratory acidosis-  Patient was transferred to stepdown unit and started on BiPAP.  Follow-up ABG showed improving PCO2.  She does have a history of sleep apnea,  - Outpatient follow-up with pulmonologist for sleep study so that she can get a new CPAP machine advised -Prednisone as prescribed -Appointment had been  made with Dr. Kara Mead for Tuesday March 9th at  1515 PM -  2)Acute metabolic encephalopathy--  Patient was increasingly drowsy related to hypercapnia.  Overall mental status improved with BiPAP therapy----- -patient is back to baseline mentation  3) E. coli UTI--treated with IV Rocephin, okay to discharge on Keflex   4)Acute on chronic diastolic congestive heart failure-diuresed well with IV Lasix,   Overall volume status appears to have improved.  Currently, she appears to be euvolemic. -Fluid restriction advised,  -Continue oral Lasix -Outpatient follow-up with cardiologist Dr. Percival Spanish advised  5)Diabetes-  resume insulin and Metformin and lantus.   6)Restless leg syndrome. Continue mirapex  7)HTN-- .  Blood pressure stable.  Continue on amlodipine, lisinopril and coreg   DVT prophylaxis: lovenox Code Status: full code Family Communication: discussed with husband at bedside Disposition Plan:  .  Transfer to telemetry from stepdown , possible discharge home in 1 to 2 days if respiratory and mental status continues to improve    Consultants:     Procedures:   Echo  Antimicrobials:   Ceftriaxone 2/21 >  Discharge Condition: stable  Follow UP  Follow-up Information    [facility] Follow up.   Specialty: Home Health Services Why:  PT Contact information: [address] [place] [address] [phone]        Minus Breeding, MD. Schedule an appointment as soon as possible for a visit in 2 week(s).   Specialty: Cardiology Contact information: [address] [place] [address] [phone]        Rigoberto Noel, MD. Go on 07/25/2019.   Specialty: Pulmonary Disease Why: Dr. Kara Mead ----Pulmonologist--at [facility]: [address], [place], [place] [address], ---Phone: [phone] --appointment has been made for you for Tuesday March 9th at  1515 PM  Contact information: [place] [address] [place] [address] [phone]          Diet and Activity recommendation:  As advised  Discharge Instructions    Discharge Instructions    Call MD for:  difficulty breathing, headache or visual disturbances   Complete by: As directed    Call MD for:  extreme fatigue   Complete by: As directed    Call MD for:  persistant dizziness or light-headedness   Complete by: As directed    Call MD for:  persistant nausea and vomiting   Complete by: As directed    Call MD for:  redness, tenderness, or signs of infection (pain, swelling, redness, odor or green/yellow discharge around incision site)   Complete by: As directed    Call MD for:  temperature >100.4   Complete by: As directed    Diet - low sodium heart healthy   Complete by: As directed    Diet Carb Modified   Complete by: As directed    Discharge instructions   Complete by: As directed    1)Very low-salt diet advised 2)Weigh yourself daily, call if you gain more than 3 pounds in 1 day or more than 5 pounds in 1 week as your diuretic medications may need to be adjusted 3)Limit your Fluid  intake to no more than 60 ounces (1.8 Liters) per day 4) please use oxygen via nasal cannula at 3 L/min continuously day and night 5) you need to follow-up with pulmonologist at [facility] pulmonology clinic in [place] in order to have a sleep study scheduled for you 6) you will  need a sleep study in  order to get a new CPAP machine 7)Outpatient follow-up with your cardiologist Dr. Percival Spanish advised over the next couple weeks for recheck and reevaluation 8)Dr. Kara Mead ----Pulmonologist--at [facility]: [address], [place], [place] [address], ---Phone: [phone] --appointment has been made for you for Tuesday March 9th at  1515 PM   Increase activity slowly   Complete by: As directed         Discharge Medications     Allergies as of 07/11/2019      Reactions   Sulfa Antibiotics Swelling   Other reaction(s): Facial Swelling "swelling lips"   Sulfonamide Derivatives Swelling   Lips Swelling   Other    BLOOD PRODUCT REFUSAL      Medication List    STOP taking these medications   potassium chloride 10 MEQ tablet Commonly known as: KLOR-CON     TAKE these medications   Accu-Chek FastClix Lancets Misc Use as directed to check blood sugar   Accu-Chek Guide Me w/Device Kit 1 kit by Does not apply route daily. Dx:E11.40   Accu-Chek Guide test strip Generic drug: glucose blood Check blood sugars twice daily for diabetes. Dx:E11.9   acetaminophen 325 MG tablet Commonly known as: TYLENOL Take 2 tablets (650 mg total) by mouth every 6 (six) hours as needed for mild pain (or Fever >/= 101).   albuterol (2.5 MG/3ML) 0.083% nebulizer solution Commonly known as: PROVENTIL Take 3 mLs (2.5 mg total) by nebulization every 6 (six) hours as needed for wheezing or shortness of breath.   amLODipine 10 MG tablet Commonly known as: NORVASC Take 1 tablet (10 mg total) by mouth daily. Start taking on: July 12, 2019 What changed:   medication strength  how much to take   BD Pen Needle Nano U/F 32G X 4 MM Misc Generic drug: Insulin Pen Needle USE AS DIRECTED ONCE DAILY   carvedilol 3.125 MG tablet Commonly known as: COREG Take 1 tablet (3.125 mg total) by mouth 2 (two) times daily. What changed: when to take this   cephALEXin  500 MG capsule Commonly known as: Keflex Take 1 capsule (500 mg total) by mouth 3 (three) times daily for 3 days.   furosemide 40 MG tablet Commonly known as: LASIX Take 1 tablet (40 mg total) by mouth daily.   gabapentin 100 MG capsule Commonly known as: NEURONTIN Take 1 capsule (100 mg total) by mouth 2 (two) times daily.   Lantus SoloStar 100 UNIT/ML Solostar Pen Generic drug: Insulin Glargine Inject 25 Units into the skin at bedtime. What changed:   how much to take  how to take this  when to take this  additional instructions   lisinopril 20 MG tablet Commonly known as: ZESTRIL Take 1 tablet (20 mg total) by mouth daily.   metFORMIN 1000 MG tablet Commonly known as: GLUCOPHAGE Take 1 tablet (1,000 mg total) by mouth 2 (two) times daily. What changed: when to take this   methocarbamol 500 MG tablet Commonly known as: ROBAXIN Take 500 mg by mouth every 6 (six) hours as needed for muscle spasms. Reported on 09/25/2015   pramipexole 1 MG tablet Commonly known as: MIRAPEX Take 1 tablet (1 mg total) by mouth 2 (two) times daily.   predniSONE 20 MG tablet Commonly known as: Deltasone Take 1 tablet (20 mg total) by mouth daily with breakfast.            Durable Medical Equipment  (From admission,  onward)         Start     Ordered   07/11/19 1209  DME Oxygen  Once    Comments: SATURATION QUALIFICATIONS: (Thisnote is usedto comply with regulatory documentation for home oxygen)  Patient Saturations on Room Air at Rest =81%  Patient Saturations on [employer] while Ambulating =81 %  Patient Saturations on3Liters of oxygen while Ambulating = 93 %   Patient needs continuous O2 at 3 L/min continuously via nasal cannula with humidifier, with gaseous portability and conserving device  Diagnosis--congestive heart failure and chronic hypoxic and hypercapnic respiratory failure  -Generalized weakness and deconditioning-history of  noncompliance   Question Answer Comment  Length of Need Lifetime   Mode or (Route) Nasal cannula   Liters per Minute 3   Frequency Continuous (stationary and portable oxygen unit needed)   Oxygen conserving device Yes   Oxygen delivery system Gas      07/11/19 1224          Major procedures and Radiology Reports - PLEASE review detailed and final reports for all details, in brief -  CT ANGIO CHEST PE W OR WO CONTRAST  Result Date: 07/07/2019 CLINICAL DATA:  79 year old female with hypoxia. EXAM: CT ANGIOGRAPHY CHEST WITH CONTRAST TECHNIQUE: Multidetector CT imaging of the chest was performed using the standard protocol during bolus administration of intravenous contrast. Multiplanar CT image reconstructions and MIPs were obtained to evaluate the vascular anatomy. CONTRAST:  59m OMNIPAQUE IOHEXOL 350 MG/ML SOLN COMPARISON:  Chest radiograph dated 07/07/2019. Comparison is also made to the chest CT dated 07/02/2015. FINDINGS: Cardiovascular: There is no cardiomegaly or pericardial effusion. Coronary vascular calcifications noted. There is moderate atherosclerotic calcification of the thoracic aorta. The origins of the great vessels of the aortic arch appear patent as visualized. Evaluation of the pulmonary arteries is limited due to eventration of the left hemidiaphragm and suboptimal visualization of the peripheral branches. No large or central pulmonary artery embolus identified. Mediastinum/Nodes: There is no hilar or mediastinal adenopathy. The esophagus is grossly unremarkable. No mediastinal fluid collection. Lungs/Pleura: There is eventration of the left hemidiaphragm with mass effect and partial left lung base atelectasis similar to prior CT. Pneumonia is not excluded. Clinical correlation is recommended. Right lung base linear atelectasis/scarring noted. There is no pleural effusion or pneumothorax. The central airways are patent. Upper Abdomen: Probable fatty liver. Partially visualized indeterminate  hypodense lesion from the anterior interpolar left kidney similar to prior CT, likely a cyst. Musculoskeletal: Degenerative changes of the spine. No acute osseous pathology. A 3.0 x 2.4 cm ovoid lesion in the right breast appears similar to prior CT, incompletely characterized, possibly a focal area of fat contusion. Review of the MIP images confirms the above findings. IMPRESSION: 1. No acute intrathoracic pathology. No CT evidence of pulmonary embolism. 2. Eventration of the left hemidiaphragm with associated partial compressive atelectasis of the left lung base. The overall findings are similar to the CT of 2017. Electronically Signed   By: AAnner CreteM.D.   On: 07/07/2019 21:09   DG CHEST PORT 1 VIEW  Result Date: 07/09/2019 CLINICAL DATA:  Shortness of breath. EXAM: PORTABLE CHEST 1 VIEW COMPARISON:  07/07/2019 FINDINGS: Patient is rotated to the left. Stable elevation left hemidiaphragm. Subtle patchy hazy density over the left base which may be due to atelectasis or infection. Stable cardiomegaly. Remainder the exam is unchanged. IMPRESSION: Subtle patchy density left base which may be due to atelectasis or infection. Stable cardiomegaly. Electronically Signed   By:  Marin Olp M.D.   On: 07/09/2019 12:11   DG Chest Port 1 View  Result Date: 07/07/2019 CLINICAL DATA:  Short of breath. EXAM: PORTABLE CHEST 1 VIEW COMPARISON:  Three hundred twenty-eight FINDINGS: Low lung volumes. Large cardiac silhouette. Chronic elevation LEFT hemidiaphragm. The LEFT lung base poorly evaluated. Mild central venous congestion. There are 2 hazy margined 4 mm nodules in the LEFT upper lobe IMPRESSION: 1. Central venous congestion with low lung volumes. 2. Two nodular foci in the LEFT upper lobe could represent airspace disease or peribronchial edema. Consider CT thorax to evaluate for pulmonary nodularity. Electronically Signed   By: Suzy Bouchard M.D.   On: 07/07/2019 12:57   ECHOCARDIOGRAM  COMPLETE  Result Date: 07/08/2019    ECHOCARDIOGRAM REPORT   Patient Name:   [name] Date of Exam: 07/08/2019 Medical Rec #:  [id number]     Height:       60.0 in Accession #:    [id number]    Weight:       209.0 lb Date of Birth:  [birth year]     BSA:          1.902 m Patient Age:    64 years      BP:           159/79 mmHg Patient Gender: F             HR:           78 bpm. Exam Location:  [facility] Procedure: 2D Echo, Cardiac Doppler and Color Doppler Indications:    Congestive Heart Failure 428.0 / I50.9  History:        Patient has prior history of Echocardiogram examinations, most                 recent 07/03/2015. CHF; Risk Factors:Hypertension and Diabetes.                 Breast cancer of upper-outer quadrant of right female                 breast,Hearing loss - has hearing aides, follow by                 audiologist,Obesity,Obstructive sleep apnea.  Sonographer:    Alvino Chapel RCS Referring Phys: [phone] [place]  1. Left ventricular ejection fraction, by estimation, is 60 to 65%. The left ventricle has normal function. The left ventricle has no regional wall motion abnormalities. There is mild to moderate left ventricular hypertrophy. Left ventricular diastolic parameters are consistent with Grade I diastolic dysfunction (impaired relaxation).  2. Right ventricular systolic function is normal. The right ventricular size is normal. Tricuspid regurgitation signal is inadequate for assessing PA pressure.  3. The mitral valve is grossly normal, there is mild to moderate annular calcification. Trivial mitral valve regurgitation.  4. The aortic valve was not well visualized. Mild aortic leaflet calcification. Aortic valve regurgitation is mild.  5. IVC dilated, but not able to estimate CVP (no sniff). FINDINGS  Left Ventricle: Left ventricular ejection fraction, by estimation, is 60 to 65%. The left ventricle has normal function. The left ventricle has no regional wall motion  abnormalities. Definity contrast agent was given IV to delineate the left ventricular  endocardial borders. The left ventricular internal cavity size was normal in size. There is moderate left ventricular hypertrophy. Left ventricular diastolic parameters are consistent with Grade I diastolic dysfunction (impaired relaxation). Right Ventricle: The right ventricular size is normal.  No increase in right ventricular wall thickness. Right ventricular systolic function is normal. Tricuspid regurgitation signal is inadequate for assessing PA pressure. Left Atrium: Left atrial size was normal in size. Right Atrium: Right atrial size was normal in size. Pericardium: There is no evidence of pericardial effusion. Presence of pericardial fat pad. Mitral Valve: The mitral valve is grossly normal. Mild to moderate mitral annular calcification. Trivial mitral valve regurgitation. Tricuspid Valve: The tricuspid valve is not well visualized. Tricuspid valve regurgitation is trivial. Aortic Valve: The aortic valve was not well visualized. Aortic valve regurgitation is mild. Aortic regurgitation PHT measures 560 msec. There is mild calcification of the aortic valve. Pulmonic Valve: The pulmonic valve was grossly normal. Pulmonic valve regurgitation is trivial. Aorta: The aortic root is normal in size and structure. Venous: IVC dilated, but not able to estimate CVP (no sniff). IAS/Shunts: No atrial level shunt detected by color flow Doppler.  LEFT VENTRICLE PLAX 2D LVIDd:         3.40 cm  Diastology LVIDs:         2.34 cm  LV e' lateral:   10.00 cm/s LV PW:         1.13 cm  LV E/e' lateral: 6.1 LV IVS:        1.43 cm  LV e' medial:    5.11 cm/s LVOT diam:     1.70 cm  LV E/e' medial:  12.0 LV SV:         41.08 ml LV SV Index:   21.60 LVOT Area:     2.27 cm  RIGHT VENTRICLE RV S prime:     15.80 cm/s TAPSE (M-mode): 2.6 cm LEFT ATRIUM             Index LA diam:        1.65 cm 0.87 cm/m LA Vol (A2C):   57.3 ml 30.13 ml/m LA Vol  (A4C):   54.1 ml 28.45 ml/m LA Biplane Vol: 56.6 ml 29.76 ml/m  AORTIC VALVE LVOT Vmax:   86.20 cm/s LVOT Vmean:  55.500 cm/s LVOT VTI:    0.181 m AI PHT:      560 msec  AORTA Ao Root diam: 3.30 cm MITRAL VALVE MV Area (PHT): 2.73 cm    SHUNTS MV Decel Time: 278 msec    Systemic VTI:  0.18 m MV E velocity: 61.30 cm/s  Systemic Diam: 1.70 cm MV A velocity: 99.00 cm/s MV E/A ratio:  0.62 Rozann Lesches MD Electronically signed by Rozann Lesches MD Signature Date/Time: 07/08/2019/1:55:20 PM    Final     Micro Results    Recent Results (from the past 240 hour(s))  SARS CORONAVIRUS 2 (TAT 6-24 HRS) Nasopharyngeal Nasopharyngeal Swab     Status: None   Collection Time: 07/05/19  7:15 AM   Specimen: Nasopharyngeal Swab  Result Value Ref Range Status   SARS Coronavirus 2 NEGATIVE NEGATIVE Final    Comment: (NOTE) SARS-CoV-2 target nucleic acids are NOT DETECTED. The SARS-CoV-2 RNA is generally detectable in upper and lower respiratory specimens during the acute phase of infection. Negative results do not preclude SARS-CoV-2 infection, do not rule out co-infections with other pathogens, and should not be used as the sole basis for treatment or other patient management decisions. Negative results must be combined with clinical observations, patient history, and epidemiological information. The expected result is Negative. Fact Sheet for Patients: SugarRoll.be Fact Sheet for Healthcare Providers: https://www.woods-mathews.com/ This test is not yet approved or cleared by the [place]  FDA and  has been authorized for detection and/or diagnosis of SARS-CoV-2 by FDA under an Emergency Use Authorization (EUA). This EUA will remain  in effect (meaning this test can be used) for the duration of the COVID-19 declaration under Section 56 4(b)(1) of the Act, 21 U.S.C. section 360bbb-3(b)(1), unless the authorization is terminated or revoked  sooner. Performed at [facility], [place] [address] [place], [place] [address]   MRSA PCR Screening     Status: None   Collection Time: 07/08/19 10:49 PM   Specimen: Nasal Mucosa; Nasopharyngeal  Result Value Ref Range Status   MRSA by PCR NEGATIVE NEGATIVE Final    Comment:        The GeneXpert MRSA Assay (FDA approved for NASAL specimens only), is one component of a comprehensive MRSA colonization surveillance program. It is not intended to diagnose MRSA infection nor to guide or monitor treatment for MRSA infections. Performed at [facility], [address] [place], [place] [address]   Culture, Urine     Status: Abnormal   Collection Time: 07/09/19 12:09 PM   Specimen: Urine, Clean Catch  Result Value Ref Range Status   Specimen Description   Final    URINE, CLEAN CATCH Performed at [facility], [address], [place], [place] [address]    Special Requests   Final    NONE Performed at [facility], [address] [place], [place] [address]    Culture >=100,000 COLONIES/mL ESCHERICHIA COLI (A)  Final   Report Status 07/11/2019 FINAL  Final   Organism ID, Bacteria ESCHERICHIA COLI (A)  Final      Susceptibility   Escherichia coli - MIC*    AMPICILLIN >=32 RESISTANT Resistant     CEFAZOLIN <=4 SENSITIVE Sensitive     CEFTRIAXONE <=0.25 SENSITIVE Sensitive     CIPROFLOXACIN <=0.25 SENSITIVE Sensitive     GENTAMICIN <=1 SENSITIVE Sensitive     IMIPENEM <=0.25 SENSITIVE Sensitive     NITROFURANTOIN <=16 SENSITIVE Sensitive     TRIMETH/SULFA <=20 SENSITIVE Sensitive     AMPICILLIN/SULBACTAM 16 INTERMEDIATE Intermediate     PIP/TAZO <=4 SENSITIVE Sensitive     * >=100,000 COLONIES/mL ESCHERICHIA COLI       Today   Subjective    [name] today has no new complaints No fever  Or chills  -Ambulating without chest pains or significant dyspnea on exertion -Hypoxia with ambulation noted      Patient has been seen and examined prior to discharge    Objective   Blood pressure (!) 147/76, pulse 83, temperature 97.9 F (36.6 C), resp. rate 20, height 5' (1.524 m), weight 95.5 kg, SpO2 95 %.   Intake/Output Summary (Last 24 hours) at 07/11/2019 1226 Last data filed at 07/11/2019 0700 Gross per 24 hour  Intake 240 ml  Output 700 ml  Net -460 ml    Exam Gen:- Awake Alert, no acute distress  HEENT:- .AT, No sclera icterus Nose-  3L/min Neck-Supple Neck,No JVD,.  Lungs-improved air movement, no wheezing CV- S1, S2 normal, regular Abd-  +ve B.Sounds, Abd Soft, No tenderness,    Extremity/Skin:- No  edema,   good pulses Psych-affect is appropriate, oriented x3 Neuro-generalized weakness, no new focal deficits, no tremors    Data Review   CBC w Diff:  Lab Results  Component Value Date   WBC 9.7 07/09/2019   HGB 13.1 07/09/2019   HGB 15.5 01/25/2014   HCT 43.1 07/09/2019   HCT 47.7 (H) 01/25/2014  PLT 248 07/09/2019   PLT 271 01/25/2014   LYMPHOPCT 15 07/07/2019   LYMPHOPCT 19.4 01/25/2014   MONOPCT 7 07/07/2019   MONOPCT 9.0 01/25/2014   EOSPCT 3 07/07/2019   EOSPCT 3.7 01/25/2014   BASOPCT 0 07/07/2019   BASOPCT 0.7 01/25/2014    CMP:  Lab Results  Component Value Date   NA 136 07/10/2019   NA 139 01/25/2014   K 3.8 07/10/2019   K 3.8 01/25/2014   CL 92 (L) 07/10/2019   CL 99 10/06/2012   CO2 36 (H) 07/10/2019   CO2 27 01/25/2014   BUN 16 07/10/2019   BUN 13.6 01/25/2014   CREATININE 0.57 07/10/2019   CREATININE 0.58 (L) 11/05/2015   CREATININE 0.8 01/25/2014   PROT 7.2 12/27/2018   PROT 7.3 01/25/2014   ALBUMIN 4.1 12/27/2018   ALBUMIN 3.4 (L) 01/25/2014   BILITOT 0.5 12/27/2018   BILITOT 0.30 01/25/2014   ALKPHOS 104 12/27/2018   ALKPHOS 97 01/25/2014   AST 25 12/27/2018   AST 18 01/25/2014   ALT 21 12/27/2018   ALT 18 01/25/2014  .   Total Discharge time is about 33 minutes  Roxan Hockey M.D on 07/11/2019 at 12:26 PM  Go to [email or web address] -  for contact info  [facility]  - Office  [phone]

## 2019-07-12 NOTE — Telephone Encounter (Signed)
Unable to contact Pt husband C[place], Voice mail is full

## 2019-07-13 ENCOUNTER — Telehealth: Payer: Self-pay

## 2019-07-13 NOTE — Telephone Encounter (Signed)
Denice Bors with [facility] called to inform that they would be going out to the patient 07/14/19 to see her for nursing and PT. She states patient was discharged from the hospital on 07/11/19.

## 2019-07-14 ENCOUNTER — Telehealth: Payer: Self-pay

## 2019-07-14 ENCOUNTER — Other Ambulatory Visit: Payer: Self-pay | Admitting: *Deleted

## 2019-07-14 DIAGNOSIS — J9621 Acute and chronic respiratory failure with hypoxia: Secondary | ICD-10-CM | POA: Diagnosis not present

## 2019-07-14 DIAGNOSIS — F419 Anxiety disorder, unspecified: Secondary | ICD-10-CM | POA: Diagnosis not present

## 2019-07-14 DIAGNOSIS — I5033 Acute on chronic diastolic (congestive) heart failure: Secondary | ICD-10-CM | POA: Diagnosis not present

## 2019-07-14 DIAGNOSIS — J9622 Acute and chronic respiratory failure with hypercapnia: Secondary | ICD-10-CM | POA: Diagnosis not present

## 2019-07-14 DIAGNOSIS — I110 Hypertensive heart disease with heart failure: Secondary | ICD-10-CM | POA: Diagnosis not present

## 2019-07-14 DIAGNOSIS — M19011 Primary osteoarthritis, right shoulder: Secondary | ICD-10-CM | POA: Diagnosis not present

## 2019-07-14 DIAGNOSIS — F330 Major depressive disorder, recurrent, mild: Secondary | ICD-10-CM | POA: Diagnosis not present

## 2019-07-14 DIAGNOSIS — G2581 Restless legs syndrome: Secondary | ICD-10-CM | POA: Diagnosis not present

## 2019-07-14 DIAGNOSIS — E1140 Type 2 diabetes mellitus with diabetic neuropathy, unspecified: Secondary | ICD-10-CM | POA: Diagnosis not present

## 2019-07-14 NOTE — Telephone Encounter (Signed)
Spoke with [name] with [facility] care. Per PCP to give verbal orders to start care. Advised PT to have patient call to schedule an hospital follow up. She is agreeable.

## 2019-07-14 NOTE — Telephone Encounter (Signed)
Patient has not been seen for any orders from PCP. Please advise

## 2019-07-14 NOTE — Telephone Encounter (Signed)
Ok to give verbal but patient needs hospital follow-up so I can continue orders.

## 2019-07-14 NOTE — Patient Outreach (Addendum)
[facility]) Care Management  07/14/2019  [name] 05-09-1941 [id number]   General Discharge Red Flag Alert Day: #1 Date: 07/13/19 Reason : Questions on DC paper "Yes", Know who to call about changes"- No.  Outreach attempt #1 Subjective: Successful outreach call to patient, explained reason for the call and HIPAA confirmed x 2 identifiers. Addressed EMMI red flag alerts, she reports everything is worked out she does not have questions about discharge papers. Patient states that her daughter [name] is helping her out with keeping up with  her medications and appointments and is aware who to call if she has problems. She states her husband is with her and is able to provide transportation.  Patient states that she is okay just sleepy, she reports having a visit from home health nurse on today. She denies shortness of breath or discomfort .  Patient denies any other concerns at this time.   Plan Will plan case closure.    Joylene Draft, RN, BSN  [facility] Coordinator  [phone]- Mobile [phone]- Toll Free Main Office

## 2019-07-14 NOTE — Telephone Encounter (Signed)
April with [facility] called to get a verbal order for the patient with start of care being today.  1 week 1 2 week 2 1 prn  For CHS education, respiratory education, and medication education. She can be reached at [phone].

## 2019-07-17 ENCOUNTER — Telehealth: Payer: Self-pay | Admitting: Physician Assistant

## 2019-07-17 DIAGNOSIS — J9622 Acute and chronic respiratory failure with hypercapnia: Secondary | ICD-10-CM | POA: Diagnosis not present

## 2019-07-17 DIAGNOSIS — I110 Hypertensive heart disease with heart failure: Secondary | ICD-10-CM | POA: Diagnosis not present

## 2019-07-17 DIAGNOSIS — I5033 Acute on chronic diastolic (congestive) heart failure: Secondary | ICD-10-CM | POA: Diagnosis not present

## 2019-07-17 DIAGNOSIS — F419 Anxiety disorder, unspecified: Secondary | ICD-10-CM | POA: Diagnosis not present

## 2019-07-17 DIAGNOSIS — M19011 Primary osteoarthritis, right shoulder: Secondary | ICD-10-CM | POA: Diagnosis not present

## 2019-07-17 DIAGNOSIS — F330 Major depressive disorder, recurrent, mild: Secondary | ICD-10-CM | POA: Diagnosis not present

## 2019-07-17 DIAGNOSIS — J9621 Acute and chronic respiratory failure with hypoxia: Secondary | ICD-10-CM | POA: Diagnosis not present

## 2019-07-17 DIAGNOSIS — E1140 Type 2 diabetes mellitus with diabetic neuropathy, unspecified: Secondary | ICD-10-CM | POA: Diagnosis not present

## 2019-07-17 DIAGNOSIS — G2581 Restless legs syndrome: Secondary | ICD-10-CM | POA: Diagnosis not present

## 2019-07-17 NOTE — Telephone Encounter (Signed)
Pt is scheduled for a Hosp f/up tomorrow she and her daughter would like to have an in office visit. They both have passed the screening. Please advise

## 2019-07-17 NOTE — Telephone Encounter (Signed)
I have made them aware//ELEA

## 2019-07-17 NOTE — Telephone Encounter (Signed)
That is fine 

## 2019-07-18 ENCOUNTER — Ambulatory Visit (INDEPENDENT_AMBULATORY_CARE_PROVIDER_SITE_OTHER): Payer: Medicare HMO | Admitting: Physician Assistant

## 2019-07-18 ENCOUNTER — Encounter: Payer: Self-pay | Admitting: Physician Assistant

## 2019-07-18 ENCOUNTER — Other Ambulatory Visit: Payer: Self-pay

## 2019-07-18 ENCOUNTER — Telehealth: Payer: Self-pay | Admitting: Physician Assistant

## 2019-07-18 VITALS — BP 130/80 | HR 96 | Temp 98.2°F | Resp 16 | Ht 60.0 in | Wt 214.0 lb

## 2019-07-18 DIAGNOSIS — J9621 Acute and chronic respiratory failure with hypoxia: Secondary | ICD-10-CM

## 2019-07-18 DIAGNOSIS — J9622 Acute and chronic respiratory failure with hypercapnia: Secondary | ICD-10-CM

## 2019-07-18 DIAGNOSIS — B9620 Unspecified Escherichia coli [E. coli] as the cause of diseases classified elsewhere: Secondary | ICD-10-CM

## 2019-07-18 DIAGNOSIS — I5033 Acute on chronic diastolic (congestive) heart failure: Secondary | ICD-10-CM

## 2019-07-18 DIAGNOSIS — E1140 Type 2 diabetes mellitus with diabetic neuropathy, unspecified: Secondary | ICD-10-CM

## 2019-07-18 DIAGNOSIS — G4733 Obstructive sleep apnea (adult) (pediatric): Secondary | ICD-10-CM | POA: Diagnosis not present

## 2019-07-18 DIAGNOSIS — N390 Urinary tract infection, site not specified: Secondary | ICD-10-CM

## 2019-07-18 DIAGNOSIS — Z794 Long term (current) use of insulin: Secondary | ICD-10-CM | POA: Diagnosis not present

## 2019-07-18 DIAGNOSIS — I10 Essential (primary) hypertension: Secondary | ICD-10-CM | POA: Diagnosis not present

## 2019-07-18 DIAGNOSIS — G9341 Metabolic encephalopathy: Secondary | ICD-10-CM

## 2019-07-18 LAB — CBC WITH DIFFERENTIAL/PLATELET
Basophils Absolute: 0.1 10*3/uL (ref 0.0–0.1)
Basophils Relative: 0.6 % (ref 0.0–3.0)
Eosinophils Absolute: 0.2 10*3/uL (ref 0.0–0.7)
Eosinophils Relative: 2.2 % (ref 0.0–5.0)
HCT: 38.7 % (ref 36.0–46.0)
Hemoglobin: 12.9 g/dL (ref 12.0–15.0)
Lymphocytes Relative: 17 % (ref 12.0–46.0)
Lymphs Abs: 1.7 10*3/uL (ref 0.7–4.0)
MCHC: 33.3 g/dL (ref 30.0–36.0)
MCV: 93.6 fl (ref 78.0–100.0)
Monocytes Absolute: 0.7 10*3/uL (ref 0.1–1.0)
Monocytes Relative: 7.1 % (ref 3.0–12.0)
Neutro Abs: 7.5 10*3/uL (ref 1.4–7.7)
Neutrophils Relative %: 73.1 % (ref 43.0–77.0)
Platelets: 288 10*3/uL (ref 150.0–400.0)
RBC: 4.13 Mil/uL (ref 3.87–5.11)
RDW: 14.1 % (ref 11.5–15.5)
WBC: 10.3 10*3/uL (ref 4.0–10.5)

## 2019-07-18 LAB — BASIC METABOLIC PANEL
BUN: 25 mg/dL — ABNORMAL HIGH (ref 6–23)
CO2: 34 mEq/L — ABNORMAL HIGH (ref 19–32)
Calcium: 9.2 mg/dL (ref 8.4–10.5)
Chloride: 98 mEq/L (ref 96–112)
Creatinine, Ser: 0.68 mg/dL (ref 0.40–1.20)
GFR: 83.19 mL/min (ref >60.00)
Glucose, Bld: 111 mg/dL — ABNORMAL HIGH (ref 70–99)
Potassium: 3.6 mEq/L (ref 3.5–5.1)
Sodium: 139 mEq/L (ref 135–145)

## 2019-07-18 MED ORDER — POTASSIUM CHLORIDE ER 10 MEQ PO TBCR: 10.0000 meq | EXTENDED_RELEASE_TABLET | Freq: Every day | ORAL | 0 refills | Status: DC

## 2019-07-18 NOTE — Telephone Encounter (Signed)
[facility] for verbal orders. Also want to make sure skilled nursing is coming out to the house for evaluation.

## 2019-07-18 NOTE — Patient Instructions (Signed)
Please go to the lab today for blood work.  I will call you with your results. We will alter treatment regimen(s) if indicated by your results.   Please make sure to restrict fluid intake to 60 ounces or less daily.  Check weight once daily (same time each day) and write this down. If you notice more than a 3 lb weight gain in 1 day or > 5 lb increase in 3 days, you need to call us or the Cardiologist.  Please increase the Lasix to 1.5 tablets (60 mg) once daily for the next 3 days. Then resume 40 mg dose. Take the potassium supplement daily as directed.  Follow-up with the specialists as scheduled next week. I will schedule follow-up here with you based on lab results.  We will work on getting everything sorted out with [facility].

## 2019-07-18 NOTE — Progress Notes (Signed)
Patient and daughter [name]) present to clinic today for hospital follow-up.  Patient was sent to the ER on 07/07/2019 after she was noted to be hypoxic at an appointment with her ophthalmologist.  ER assessment revealed O2 sats of 81% on room air.  Chest x-ray obtained revealing potential volume overload.  She was given IV Lasix in the emergency room.  Arterial blood gas performed revealing elevated PCO2 and respiratory acidosis.  Patient was admitted and transferred to the stepdown unit started on BiPAP.  Repeat ABGs revealed improving PCO2.  Patient initially had some acute metabolic encephalopathy that resolved with BiPAP therapy.  She returned to baseline mentation during hospitalization.  Patient remained euvolemic after IV Lasix.  Patient was also noted to have a E. coli UTI during hospitalization.  This was initially treated with IV Rocephin and transition to oral Keflex to complete course on discharge.  Patient was deemed stable to be discharged home on 07/11/2019 with close outpatient primary care, cardiology and pulmonology follow-up.  Was recommended that she have repeat sleep study so that a new CPAP machine could be ordered for her.  She is also sent home on 3 L of oxygen, sodium and fluid restricted diet, and encouraged to keep up with daily weights.  Since discharge, patient endorses doing well overall.  States she just received a new oxygen concentrator yesterday and has noted that O2 levels are staying higher at 3 L/min.  Now averaging about 98% versus 94 to 95% previously.  Denies any chest pain or shortness of breath since starting daily oxygen.  Has been limiting her fluid intake to less than 60 ounces per day.  Is keeping well nourished while trying to follow a low-salt diet.  Is also watching carbs.  Continues to check glucose daily.  Notes fasting glucose averaging around 110-120.  Denies any chest pain, racing heart, lightheadedness or dizziness.  Is taking her Lasix as directed.  Still  notes some swelling of legs bilaterally but notes this is improved.  Has been checking weight every other day and notes it to remain within a 2 pound variation -110-112 pounds.  Denies PND or orthopnea.  Does have follow-up scheduled with cardiology next Monday, and with pulmonology next Tuesday.  Denies any confusion or altered mental status.  Home health physical therapy has come out to the house and is starting balance training.  Is still waiting for home health RN to come to the house for initial assessment.  Home health services are currently through [facility].  Patient also endorses completing entire course of Keflex.  Denies any urinary symptoms.  Notes bowels are regular.  Past Medical History:  Diagnosis Date  . AK (actinic keratosis)   . Anxiety    occasional  . Breast cancer ([place])    R breast, s/p radiation 34Gy/64f 04/29/10-05/05/10  . CTS (carpal tunnel syndrome)    Left  . Diabetes ([place]   . Diastolic CHF ([place]    Dx w/ hospitalization 06/2015 for respiratory failure  . Facial asymmetry, acquired 08/20/2016   From injury  . Falls frequently    "can not pick left leg up"  . Hearing loss    bilateral hearing aides  . History of environmental allergies   . History of kidney stones 20 years ago  . Hypertension   . MDD (major depressive disorder)   . No blood products ([place])  09/11/2013  . Osteoarthritis    hx shoulder, knee, back, wrist pain; saw Dr. AWynelle Link  .  Personal history of radiation therapy   . RESTLESS LEGS SYNDROME 05/05/2007   Qualifier: Diagnosis of  By: Gwenette Greet MD, Armando Reichert   . RLS (restless legs syndrome)   . Sleep apnea     Current Outpatient Medications on File Prior to Visit  Medication Sig Dispense Refill  . Accu-Chek FastClix Lancets MISC Use as directed to check blood sugar (Patient not taking: Reported on 07/07/2019) 102 each 6  . acetaminophen (TYLENOL) 325 MG tablet Take 2 tablets (650 mg total) by mouth every 6 (six) hours as needed for mild  pain (or Fever >/= 101). 12 tablet 0  . albuterol (PROVENTIL) (2.5 MG/3ML) 0.083% nebulizer solution Take 3 mLs (2.5 mg total) by nebulization every 6 (six) hours as needed for wheezing or shortness of breath. 75 mL 12  . amLODipine (NORVASC) 10 MG tablet Take 1 tablet (10 mg total) by mouth daily. 30 tablet 3  . Blood Glucose Monitoring Suppl (ACCU-CHEK GUIDE ME) w/Device KIT 1 kit by Does not apply route daily. Dx:E11.40 (Patient not taking: Reported on 07/07/2019) 1 kit 0  . carvedilol (COREG) 3.125 MG tablet Take 1 tablet (3.125 mg total) by mouth 2 (two) times daily. 60 tablet 2  . furosemide (LASIX) 40 MG tablet Take 1 tablet (40 mg total) by mouth daily. 90 tablet 1  . gabapentin (NEURONTIN) 100 MG capsule Take 1 capsule (100 mg total) by mouth 2 (two) times daily. 60 capsule 3  . glucose blood (ACCU-CHEK GUIDE) test strip Check blood sugars twice daily for diabetes. Dx:E11.9 (Patient not taking: Reported on 07/07/2019) 100 each 12  . Insulin Glargine (LANTUS SOLOSTAR) 100 UNIT/ML Solostar Pen Inject 25 Units into the skin at bedtime. 15 mL 1  . Insulin Pen Needle (BD PEN NEEDLE NANO U/F) 32G X 4 MM MISC USE AS DIRECTED ONCE DAILY (Patient not taking: Reported on 07/07/2019) 100 each 6  . lisinopril (ZESTRIL) 20 MG tablet Take 1 tablet (20 mg total) by mouth daily. 90 tablet 2  . metFORMIN (GLUCOPHAGE) 1000 MG tablet Take 1 tablet (1,000 mg total) by mouth 2 (two) times daily. 180 tablet 2  . methocarbamol (ROBAXIN) 500 MG tablet Take 500 mg by mouth every 6 (six) hours as needed for muscle spasms. Reported on 09/25/2015    . pramipexole (MIRAPEX) 1 MG tablet Take 1 tablet (1 mg total) by mouth 2 (two) times daily. 180 tablet 0  . predniSONE (DELTASONE) 20 MG tablet Take 1 tablet (20 mg total) by mouth daily with breakfast. 5 tablet 0  . [DISCONTINUED] potassium chloride (KLOR-CON) 10 MEQ tablet Take 1 tablet (10 mEq total) by mouth daily. (Patient not taking: Reported on 06/29/2019) 90 tablet 0    No current facility-administered medications on file prior to visit.    Allergies  Allergen Reactions  . Sulfa Antibiotics Swelling    Other reaction(s): Facial Swelling "swelling lips"   . Sulfonamide Derivatives Swelling    Lips Swelling  . Other     BLOOD PRODUCT REFUSAL    Family History  Problem Relation Age of Onset  . Alcohol abuse Father   . Cancer Sister        breast  . Heart disease Paternal Grandfather   . Diabetes Other   . Obesity Other   . Heart disease Brother        Valve    Social History   Socioeconomic History  . Marital status: Married    Spouse name: Not on file  . Number of  children: 3  . Years of education: Not on file  . Highest education level: Not on file  Occupational History  . Not on file  Tobacco Use  . Smoking status: Never Smoker  . Smokeless tobacco: Never Used  Substance and Sexual Activity  . Alcohol use: No  . Drug use: No  . Sexual activity: Yes  Other Topics Concern  . Not on file  Social History Narrative         Home Situation: lives with husband and granddaughter      Spiritual Beliefs: Jehovah Witness - no blood products               Social Determinants of Health   Financial Resource Strain:   . Difficulty of Paying Living Expenses: Not on file  Food Insecurity:   . Worried About Charity fundraiser in the Last Year: Not on file  . Ran Out of Food in the Last Year: Not on file  Transportation Needs:   . Lack of Transportation (Medical): Not on file  . Lack of Transportation (Non-Medical): Not on file  Physical Activity:   . Days of Exercise per Week: Not on file  . Minutes of Exercise per Session: Not on file  Stress:   . Feeling of Stress : Not on file  Social Connections:   . Frequency of Communication with Friends and Family: Not on file  . Frequency of Social Gatherings with Friends and Family: Not on file  . Attends Religious Services: Not on file  . Active Member of Clubs or  Organizations: Not on file  . Attends Archivist Meetings: Not on file  . Marital Status: Not on file   Review of Systems - See HPI.  All other ROS are negative.  Wt 214 lb (97.1 kg)   BMI 41.79 kg/m   Physical Exam Vitals reviewed.  Constitutional:      Appearance: Normal appearance. She is obese.  HENT:     Head: Normocephalic and atraumatic.  Eyes:     Conjunctiva/sclera: Conjunctivae normal.     Pupils: Pupils are equal, round, and reactive to light.  Cardiovascular:     Rate and Rhythm: Normal rate and regular rhythm.     Pulses:          Dorsalis pedis pulses are 2+ on the right side and 2+ on the left side.       Posterior tibial pulses are 2+ on the right side and 2+ on the left side.     Heart sounds: Normal heart sounds.     Comments: 1+ pitting edema noted of bilateral lower extremities Pulmonary:     Effort: Pulmonary effort is normal.     Breath sounds: Normal breath sounds. No wheezing or rhonchi.     Comments: Faint crackles noted in bilateral lung bases Chest:     Chest wall: No tenderness.  Abdominal:     General: Bowel sounds are normal. There is no distension.     Palpations: Abdomen is soft.     Tenderness: There is no abdominal tenderness.  Neurological:     Mental Status: She is alert and oriented to person, place, and time. Mental status is at baseline.  Psychiatric:        Mood and Affect: Mood normal.     Recent Results (from the past 2160 hour(s))  SARS CORONAVIRUS 2 (TAT 6-24 HRS) Nasopharyngeal Nasopharyngeal Swab     Status: None   Collection Time: 07/05/19  7:15 AM   Specimen: Nasopharyngeal Swab  Result Value Ref Range   SARS Coronavirus 2 NEGATIVE NEGATIVE    Comment: (NOTE) SARS-CoV-2 target nucleic acids are NOT DETECTED. The SARS-CoV-2 RNA is generally detectable in upper and lower respiratory specimens during the acute phase of infection. Negative results do not preclude SARS-CoV-2 infection, do not rule  out co-infections with other pathogens, and should not be used as the sole basis for treatment or other patient management decisions. Negative results must be combined with clinical observations, patient history, and epidemiological information. The expected result is Negative. Fact Sheet for Patients: SugarRoll.be Fact Sheet for Healthcare Providers: https://www.woods-mathews.com/ This test is not yet approved or cleared by the [place] FDA and  has been authorized for detection and/or diagnosis of SARS-CoV-2 by FDA under an Emergency Use Authorization (EUA). This EUA will remain  in effect (meaning this test can be used) for the duration of the COVID-19 declaration under Section 56 4(b)(1) of the Act, 21 U.S.C. section 360bbb-3(b)(1), unless the authorization is terminated or revoked sooner. Performed at [facility], [place] [address] [place], [place] [address]   Basic metabolic panel     Status: Abnormal   Collection Time: 07/05/19  2:22 PM  Result Value Ref Range   Sodium 141 135 - 145 mmol/L   Potassium 3.6 3.5 - 5.1 mmol/L   Chloride 98 98 - 111 mmol/L   CO2 32 22 - 32 mmol/L   Glucose, Bld 125 (H) 70 - 99 mg/dL   BUN 16 8 - 23 mg/dL   Creatinine, Ser 0.51 0.44 - 1.00 mg/dL   Calcium 8.7 (L) 8.9 - 10.3 mg/dL   GFR calc non Af Amer >60 >60 mL/min   GFR calc Af Amer >60 >60 mL/min   Anion gap 11 5 - 15    Comment: Performed at [facility], [address] [place], [place] [address]  Hemoglobin A1c     Status: Abnormal   Collection Time: 07/05/19  2:23 PM  Result Value Ref Range   Hgb A1c MFr Bld 7.6 (H) 4.8 - 5.6 %    Comment: (NOTE) Pre diabetes:          5.7%-6.4% Diabetes:              >6.4% Glycemic control for   <7.0% adults with diabetes    Mean Plasma Glucose 171.42 mg/dL    Comment: Performed at [facility] [address] [place], [place] [address]  Glucose, capillary     Status: Abnormal    Collection Time: 07/07/19  9:56 AM  Result Value Ref Range   Glucose-Capillary 130 (H) 70 - 99 mg/dL  Basic metabolic panel     Status: Abnormal   Collection Time: 07/07/19 12:19 PM  Result Value Ref Range   Sodium 140 135 - 145 mmol/L   Potassium 3.6 3.5 - 5.1 mmol/L   Chloride 97 (L) 98 - 111 mmol/L   CO2 33 (H) 22 - 32 mmol/L   Glucose, Bld 132 (H) 70 - 99 mg/dL   BUN 18 8 - 23 mg/dL   Creatinine, Ser 0.62 0.44 - 1.00 mg/dL   Calcium 8.8 (L) 8.9 - 10.3 mg/dL   GFR calc non Af Amer >60 >60 mL/min   GFR calc Af Amer >60 >60 mL/min   Anion gap 10 5 - 15    Comment: Performed at [facility], [address], [place], [place] [address]  Brain natriuretic peptide  Status: None   Collection Time: 07/07/19 12:19 PM  Result Value Ref Range   B Natriuretic Peptide 53.0 0.0 - 100.0 pg/mL    Comment: Performed at [facility], [address], [place], [place] [address]  CBC with Differential     Status: Abnormal   Collection Time: 07/07/19 12:19 PM  Result Value Ref Range   WBC 10.4 4.0 - 10.5 K/uL   RBC 4.61 3.87 - 5.11 MIL/uL   Hemoglobin 13.9 12.0 - 15.0 g/dL   HCT 45.5 36.0 - 46.0 %   MCV 98.7 80.0 - 100.0 fL   MCH 30.2 26.0 - 34.0 pg   MCHC 30.5 30.0 - 36.0 g/dL   RDW 14.2 11.5 - 15.5 %   Platelets 270 150 - 400 K/uL   nRBC 0.0 0.0 - 0.2 %   Neutrophils Relative % 75 %   Neutro Abs 7.8 (H) 1.7 - 7.7 K/uL   Lymphocytes Relative 15 %   Lymphs Abs 1.6 0.7 - 4.0 K/uL   Monocytes Relative 7 %   Monocytes Absolute 0.7 0.1 - 1.0 K/uL   Eosinophils Relative 3 %   Eosinophils Absolute 0.3 0.0 - 0.5 K/uL   Basophils Relative 0 %   Basophils Absolute 0.0 0.0 - 0.1 K/uL   Immature Granulocytes 0 %   Abs Immature Granulocytes 0.03 0.00 - 0.07 K/uL    Comment: Performed at [facility], [address], [place], [place] [address]  Troponin I (High Sensitivity)     Status: None   Collection Time: 07/07/19 12:19 PM  Result Value Ref Range   Troponin I (High Sensitivity) 5 <18 ng/L     Comment: (NOTE) Elevated high sensitivity troponin I (hsTnI) values and significant  changes across serial measurements may suggest ACS but many other  chronic and acute conditions are known to elevate hsTnI results.  Refer to the "Links" section for chest pain algorithms and additional  guidance. Performed at [facility], [address], [place], [place] [address]   Troponin I (High Sensitivity)     Status: None   Collection Time: 07/07/19  2:22 PM  Result Value Ref Range   Troponin I (High Sensitivity) 4 <18 ng/L    Comment: (NOTE) Elevated high sensitivity troponin I (hsTnI) values and significant  changes across serial measurements may suggest ACS but many other  chronic and acute conditions are known to elevate hsTnI results.  Refer to the "Links" section for chest pain algorithms and additional  guidance. Performed at [facility], [address] [place], [place] [address]   Glucose, capillary     Status: Abnormal   Collection Time: 07/07/19 11:05 PM  Result Value Ref Range   Glucose-Capillary 229 (H) 70 - 99 mg/dL   Comment 1 Notify RN    Comment 2 Document in Chart   Glucose, capillary     Status: Abnormal   Collection Time: 07/08/19  7:59 AM  Result Value Ref Range   Glucose-Capillary 135 (H) 70 - 99 mg/dL  Glucose, capillary     Status: Abnormal   Collection Time: 07/08/19 11:30 AM  Result Value Ref Range   Glucose-Capillary 179 (H) 70 - 99 mg/dL   Comment 1 Notify RN    Comment 2 Document in Chart   ECHOCARDIOGRAM COMPLETE     Status: None   Collection Time: 07/08/19 11:41 AM  Result Value Ref Range   Weight 3,343.94 oz   Height 60 in   BP 159/79 mmHg  Ammonia  Status: None   Collection Time: 07/08/19  1:02 PM  Result Value Ref Range   Ammonia 18 9 - 35 umol/L    Comment: Performed at [facility], [address], [place], [place] [address]  TSH     Status: None   Collection Time: 07/08/19  1:02 PM  Result Value Ref Range   TSH 0.902 0.350 - 4.500 uIU/mL     Comment: Performed by a 3rd Generation assay with a functional sensitivity of <=0.01 uIU/mL. Performed at [facility], [address], [place], [place] [address]   Blood gas, arterial     Status: Abnormal   Collection Time: 07/08/19  1:42 PM  Result Value Ref Range   FIO2 32.00    pH, Arterial 7.290 (L) 7.350 - 7.450   pCO2 arterial 83.3 (HH) 32.0 - 48.0 mmHg    Comment: CRITICAL RESULT CALLED TO, READ BACK BY AND VERIFIED WITH: HOWERTON M. @ [id number] ON [id number] BY HENDERSONL.    pO2, Arterial 81.0 (L) 83.0 - 108.0 mmHg   Bicarbonate 32.7 (H) 20.0 - 28.0 mmol/L   Acid-Base Excess 12.0 (H) 0.0 - 2.0 mmol/L   O2 Saturation 94.4 %   Patient temperature 37.0    Allens test (pass/fail) PASS PASS    Comment: Performed at [facility], [address], [place], [place] [address]  Glucose, capillary     Status: Abnormal   Collection Time: 07/08/19  4:10 PM  Result Value Ref Range   Glucose-Capillary 150 (H) 70 - 99 mg/dL  Urinalysis, Routine w reflex microscopic     Status: Abnormal   Collection Time: 07/08/19  6:23 PM  Result Value Ref Range   Color, Urine YELLOW YELLOW   APPearance CLEAR CLEAR   Specific Gravity, Urine 1.010 1.005 - 1.030   pH 5.0 5.0 - 8.0   Glucose, UA NEGATIVE NEGATIVE mg/dL   Hgb urine dipstick SMALL (A) NEGATIVE   Bilirubin Urine NEGATIVE NEGATIVE   Ketones, ur NEGATIVE NEGATIVE mg/dL   Protein, ur NEGATIVE NEGATIVE mg/dL   Nitrite POSITIVE (A) NEGATIVE   Leukocytes,Ua MODERATE (A) NEGATIVE   RBC / HPF 0-5 0 - 5 RBC/hpf   WBC, UA >50 (H) 0 - 5 WBC/hpf   Bacteria, UA RARE (A) NONE SEEN   Squamous Epithelial / LPF 0-5 0 - 5    Comment: Performed at [facility], [address] [place], [place] [address]  Glucose, capillary     Status: Abnormal   Collection Time: 07/08/19  9:24 PM  Result Value Ref Range   Glucose-Capillary 188 (H) 70 - 99 mg/dL  MRSA PCR Screening     Status: None   Collection Time: 07/08/19 10:49 PM   Specimen: Nasal Mucosa; Nasopharyngeal   Result Value Ref Range   MRSA by PCR NEGATIVE NEGATIVE    Comment:        The GeneXpert MRSA Assay (FDA approved for NASAL specimens only), is one component of a comprehensive MRSA colonization surveillance program. It is not intended to diagnose MRSA infection nor to guide or monitor treatment for MRSA infections. Performed at [facility], [address], [place],  [address]   Basic metabolic panel     Status: Abnormal   Collection Time: 07/09/19  4:48 AM  Result Value Ref Range   Sodium 137 135 - 145 mmol/L   Potassium 3.5 3.5 - 5.1 mmol/L   Chloride 90 (L) 98 - 111 mmol/L   CO2 40 (H) 22 - 32 mmol/L   Glucose,  Bld 127 (H) 70 - 99 mg/dL   BUN 19 8 - 23 mg/dL   Creatinine, Ser 0.75 0.44 - 1.00 mg/dL   Calcium 8.4 (L) 8.9 - 10.3 mg/dL   GFR calc non Af Amer >60 >60 mL/min   GFR calc Af Amer >60 >60 mL/min   Anion gap 7 5 - 15    Comment: Performed at [facility], [address], [place], [place] [address]  CBC     Status: Abnormal   Collection Time: 07/09/19  4:48 AM  Result Value Ref Range   WBC 9.7 4.0 - 10.5 K/uL   RBC 4.29 3.87 - 5.11 MIL/uL   Hemoglobin 13.1 12.0 - 15.0 g/dL   HCT 43.1 36.0 - 46.0 %   MCV 100.5 (H) 80.0 - 100.0 fL   MCH 30.5 26.0 - 34.0 pg   MCHC 30.4 30.0 - 36.0 g/dL   RDW 13.9 11.5 - 15.5 %   Platelets 248 150 - 400 K/uL   nRBC 0.0 0.0 - 0.2 %    Comment: Performed at [facility], [address] [place], [place] [address]  Blood gas, arterial     Status: Abnormal   Collection Time: 07/09/19  5:15 AM  Result Value Ref Range   FIO2 35.00    pH, Arterial 7.357 7.350 - 7.450   pCO2 arterial 72.2 (HH) 32.0 - 48.0 mmHg    Comment: CRITICAL RESULT CALLED TO, READ BACK BY AND VERIFIED WITH: JAMES DANIELS,RN '@[id number]'  07/09/2019 KAY    pO2, Arterial 74.4 (L) 83.0 - 108.0 mmHg   Bicarbonate 34.9 (H) 20.0 - 28.0 mmol/L   Acid-Base Excess 13.5 (H) 0.0 - 2.0 mmol/L   O2 Saturation 94.2 %   Patient temperature 37.0    Allens test (pass/fail)  PASS PASS    Comment: Performed at [facility], [address], [place], [place] [address]  Glucose, capillary     Status: Abnormal   Collection Time: 07/09/19  7:14 AM  Result Value Ref Range   Glucose-Capillary 109 (H) 70 - 99 mg/dL  Glucose, capillary     Status: Abnormal   Collection Time: 07/09/19 12:04 PM  Result Value Ref Range   Glucose-Capillary 207 (H) 70 - 99 mg/dL  Culture, Urine     Status: Abnormal   Collection Time: 07/09/19 12:09 PM   Specimen: Urine, Clean Catch  Result Value Ref Range   Specimen Description      URINE, CLEAN CATCH Performed at [facility], [address], [place], [place] [address]    Special Requests      NONE Performed at [facility], [address] [place], [place] [address]    Culture >=100,000 COLONIES/mL ESCHERICHIA COLI (A)    Report Status 07/11/2019 FINAL    Organism ID, Bacteria ESCHERICHIA COLI (A)       Susceptibility   Escherichia coli - MIC*    AMPICILLIN >=32 RESISTANT Resistant     CEFAZOLIN <=4 SENSITIVE Sensitive     CEFTRIAXONE <=0.25 SENSITIVE Sensitive     CIPROFLOXACIN <=0.25 SENSITIVE Sensitive     GENTAMICIN <=1 SENSITIVE Sensitive     IMIPENEM <=0.25 SENSITIVE Sensitive     NITROFURANTOIN <=16 SENSITIVE Sensitive     TRIMETH/SULFA <=20 SENSITIVE Sensitive     AMPICILLIN/SULBACTAM 16 INTERMEDIATE Intermediate     PIP/TAZO <=4 SENSITIVE Sensitive     * >=100,000 COLONIES/mL ESCHERICHIA COLI  Glucose, capillary     Status: Abnormal   Collection Time: 07/09/19  4:24 PM  Result  Value Ref Range   Glucose-Capillary 177 (H) 70 - 99 mg/dL  Glucose, capillary     Status: Abnormal   Collection Time: 07/09/19 10:14 PM  Result Value Ref Range   Glucose-Capillary 207 (H) 70 - 99 mg/dL   Comment 1 Notify RN    Comment 2 Document in Chart   Basic metabolic panel     Status: Abnormal   Collection Time: 07/10/19  4:08 AM  Result Value Ref Range   Sodium 136 135 - 145 mmol/L   Potassium 3.8 3.5 - 5.1 mmol/L   Chloride 92  (L) 98 - 111 mmol/L   CO2 36 (H) 22 - 32 mmol/L   Glucose, Bld 162 (H) 70 - 99 mg/dL   BUN 16 8 - 23 mg/dL   Creatinine, Ser 0.57 0.44 - 1.00 mg/dL   Calcium 8.5 (L) 8.9 - 10.3 mg/dL   GFR calc non Af Amer >60 >60 mL/min   GFR calc Af Amer >60 >60 mL/min   Anion gap 8 5 - 15    Comment: Performed at [facility], [address], [place], [place] [address]  Blood gas, arterial     Status: Abnormal   Collection Time: 07/10/19  5:30 AM  Result Value Ref Range   FIO2 32.00    pH, Arterial 7.366 7.350 - 7.450   pCO2 arterial 68.5 (HH) 32.0 - 48.0 mmHg    Comment: CRITICAL RESULT CALLED TO, READ BACK BY AND VERIFIED WITH: HEARN,J AT 5:45AM ON 07/10/19 BY FESTERMAN,C    pO2, Arterial 73.3 (L) 83.0 - 108.0 mmHg   Bicarbonate 34.2 (H) 20.0 - 28.0 mmol/L   Acid-Base Excess 12.5 (H) 0.0 - 2.0 mmol/L   O2 Saturation 93.5 %   Patient temperature 37.0    Allens test (pass/fail) PASS PASS    Comment: Performed at [facility], [address], [place],  [address]  Glucose, capillary     Status: Abnormal   Collection Time: 07/10/19  7:15 AM  Result Value Ref Range   Glucose-Capillary 150 (H) 70 - 99 mg/dL  Glucose, capillary     Status: Abnormal   Collection Time: 07/10/19 11:43 AM  Result Value Ref Range   Glucose-Capillary 229 (H) 70 - 99 mg/dL  Glucose, capillary     Status: Abnormal   Collection Time: 07/10/19  4:54 PM  Result Value Ref Range   Glucose-Capillary 154 (H) 70 - 99 mg/dL  Glucose, capillary     Status: Abnormal   Collection Time: 07/10/19  8:49 PM  Result Value Ref Range   Glucose-Capillary 204 (H) 70 - 99 mg/dL   Comment 1 Notify RN    Comment 2 Document in Chart   Glucose, capillary     Status: Abnormal   Collection Time: 07/11/19  7:21 AM  Result Value Ref Range   Glucose-Capillary 134 (H) 70 - 99 mg/dL  Glucose, capillary     Status: Abnormal   Collection Time: 07/11/19 11:13 AM  Result Value Ref Range   Glucose-Capillary 218 (H) 70 - 99 mg/dL    Comment:  Glucose reference range applies only to samples taken after fasting for at least 8 hours.    Assessment/Plan: 1. Acute on chronic respiratory failure with hypoxia and hypercapnia (HCC) O2 stable at 3L/min N/C. No wheezing on exam today. No dyspnea noted at rest or with ambulation. Repeat labs today. Follow-up with Pulmonology as scheduled.  - Basic metabolic panel - CBC w/Diff  2. Acute on chronic diastolic CHF (congestive  heart failure) ([place]) Per patient weight remains stable. Per our scale compared to weight at time of discharge there is increase in 4 lbs. Edema noted on exam with faint crackles in lung basis. O2 stable. Repeat labs today. Will have her restart her potassium supplement. Increase Lasiux to 60 mg daily for 3 days the resume 40 mg dose. Continue fluid and sodium restrictions. Daily weights as directed -- same scale, same time of day, same amount of clothing. Record. Take to Cardiology follow-up. ER precautions reviewed.  - Basic metabolic panel - CBC w/Diff  3. Acute metabolic encephalopathy Resolved. A/O at baseline. Very important she have follow-up with Pulmonology to get new CPAP machine. Continue O2 at 3L/min.  4. E. coli UTI Completed course of Keflex. Clinically resolved. Will obtain follow-up culture.  - Urine Culture  5. Type 2 diabetes mellitus with diabetic neuropathy, with long-term current use of insulin (HCC) Continue current medication regimen.  6. Essential hypertension BP stable today. Continue current regimen of Carvedilol, Amlodipine and Lisinopril. Follow-up with Cardiology as scheduled.   7. Obstructive sleep apnea Needs updated sleep study for new CPAP and equipment. Appointment with Pulmonology scheduled. Continue nighttime O2.   This visit occurred during the SARS-CoV-2 public health emergency.  Safety protocols were in place, including screening questions prior to the visit, additional usage of staff PPE, and extensive cleaning of exam room while  observing appropriate contact time as indicated for disinfecting solutions.     Leeanne Rio, PA-C

## 2019-07-18 NOTE — Telephone Encounter (Signed)
Patient has appointment today for hospital follow up

## 2019-07-18 NOTE — Telephone Encounter (Signed)
Sharyn Lull a physical therapist at [facility] called in asking for verbal orders to see pt 1X a wk for  8Wks to help with Balance and safety. Please advise and it is ok to LM if no answer

## 2019-07-18 NOTE — Telephone Encounter (Signed)
Left detailed message on VM for verbal orders for PT and to start Skilled nursing assessment

## 2019-07-19 DIAGNOSIS — M19011 Primary osteoarthritis, right shoulder: Secondary | ICD-10-CM | POA: Diagnosis not present

## 2019-07-19 DIAGNOSIS — I5033 Acute on chronic diastolic (congestive) heart failure: Secondary | ICD-10-CM | POA: Diagnosis not present

## 2019-07-19 DIAGNOSIS — J9621 Acute and chronic respiratory failure with hypoxia: Secondary | ICD-10-CM | POA: Diagnosis not present

## 2019-07-19 DIAGNOSIS — F419 Anxiety disorder, unspecified: Secondary | ICD-10-CM | POA: Diagnosis not present

## 2019-07-19 DIAGNOSIS — E1140 Type 2 diabetes mellitus with diabetic neuropathy, unspecified: Secondary | ICD-10-CM | POA: Diagnosis not present

## 2019-07-19 DIAGNOSIS — G2581 Restless legs syndrome: Secondary | ICD-10-CM | POA: Diagnosis not present

## 2019-07-19 DIAGNOSIS — I110 Hypertensive heart disease with heart failure: Secondary | ICD-10-CM | POA: Diagnosis not present

## 2019-07-19 DIAGNOSIS — J9622 Acute and chronic respiratory failure with hypercapnia: Secondary | ICD-10-CM | POA: Diagnosis not present

## 2019-07-19 DIAGNOSIS — F330 Major depressive disorder, recurrent, mild: Secondary | ICD-10-CM | POA: Diagnosis not present

## 2019-07-20 LAB — URINE CULTURE
MICRO NUMBER:: 10206108
SPECIMEN QUALITY:: ADEQUATE

## 2019-07-21 ENCOUNTER — Other Ambulatory Visit: Payer: Self-pay | Admitting: Emergency Medicine

## 2019-07-21 DIAGNOSIS — J9621 Acute and chronic respiratory failure with hypoxia: Secondary | ICD-10-CM | POA: Diagnosis not present

## 2019-07-21 DIAGNOSIS — N390 Urinary tract infection, site not specified: Secondary | ICD-10-CM

## 2019-07-21 DIAGNOSIS — F330 Major depressive disorder, recurrent, mild: Secondary | ICD-10-CM | POA: Diagnosis not present

## 2019-07-21 DIAGNOSIS — G2581 Restless legs syndrome: Secondary | ICD-10-CM | POA: Diagnosis not present

## 2019-07-21 DIAGNOSIS — I110 Hypertensive heart disease with heart failure: Secondary | ICD-10-CM | POA: Diagnosis not present

## 2019-07-21 DIAGNOSIS — E1140 Type 2 diabetes mellitus with diabetic neuropathy, unspecified: Secondary | ICD-10-CM | POA: Diagnosis not present

## 2019-07-21 DIAGNOSIS — J9622 Acute and chronic respiratory failure with hypercapnia: Secondary | ICD-10-CM | POA: Diagnosis not present

## 2019-07-21 DIAGNOSIS — M19011 Primary osteoarthritis, right shoulder: Secondary | ICD-10-CM | POA: Diagnosis not present

## 2019-07-21 DIAGNOSIS — I5033 Acute on chronic diastolic (congestive) heart failure: Secondary | ICD-10-CM | POA: Diagnosis not present

## 2019-07-21 DIAGNOSIS — F419 Anxiety disorder, unspecified: Secondary | ICD-10-CM | POA: Diagnosis not present

## 2019-07-21 MED ORDER — CEFDINIR 300 MG PO CAPS: 300.0000 mg | ORAL_CAPSULE | Freq: Two times a day (BID) | ORAL | 0 refills | Status: AC

## 2019-07-23 NOTE — Progress Notes (Signed)
Cardiology Office Note   Date:  07/24/2019   ID:  DEDEE SELLIN, DOB 1941-02-27, MRN [id number]  PCP:  Brunetta Jeans, PA-C  Cardiologist:  Dr. Percival Spanish  CC: Hospital Follow Up CHF    History of Present Illness: [name] is a 79 y.o. female who presents for ongoing assessment of dyspnea on O2, diastolic dysfunction with CHF, anxiety, diabetes, HTN. She was reinforced  In low sodium diet, use of diuretic. She was not ordered any tests or planned for new medications.   She was recently discharged from the hospital on 2.23/2021 after admission for decompensated diastolic CHF in the setting of low O2 saturation at 81% at ophthalmologist during planned cataract extraction. She was treated with IV diuretics. She was started on BiPAP.  Due to history of OSA she was restarted on CPAP on discharge. She was to see pulmonologist on outpatient follow up on 07/25/2019.  She was also found to have E-Coli UTI and treated with antibiotic. She was continued on oral lasix 40 mg daily.   She comes today with family member.  She is extremely hard of hearing making it a very difficult encounter.  Several times repeating questions with her family members assistant to help her to hear what we are saying.  She is currently on oxygen via nasal cannula, using a walker for ambulation.  She denies worsening dyspnea and is feeling some better with oxygen.  She continues to complain of lower extremity edema.  She is due to follow-up with pulmonary this week.  She denies any recurrent symptoms of chest pain.  Her weight is up approximately 3 pounds since 07-18-2019.   Past Medical History:  Diagnosis Date  . AK (actinic keratosis)   . Anxiety    occasional  . Breast cancer ([place])    R breast, s/p radiation 34Gy/38fx 04/29/10-05/05/10  . CTS (carpal tunnel syndrome)    Left  . Diabetes ([place])   . Diastolic CHF ([place])    Dx w/ hospitalization 06/2015 for respiratory failure  . Facial asymmetry, acquired 08/20/2016   From  injury  . Falls frequently    "can not pick left leg up"  . Hearing loss    bilateral hearing aides  . History of environmental allergies   . History of kidney stones 20 years ago  . Hypertension   . MDD (major depressive disorder)   . No blood products ([employer])  09/11/2013  . Osteoarthritis    hx shoulder, knee, back, wrist pain; saw Dr. Wynelle Link   . Personal history of radiation therapy   . RESTLESS LEGS SYNDROME 05/05/2007   Qualifier: Diagnosis of  By: Gwenette Greet MD, Armando Reichert   . RLS (restless legs syndrome)   . Sleep apnea     Past Surgical History:  Procedure Laterality Date  . APPENDECTOMY  age 71  . BREAST LUMPECTOMY Right 2012  . BREAST SURGERY  2012   rt breast lumpectomy  . flex signoidoscopy    . INCISION AND DRAINAGE PERIRECTAL ABSCESS    . JOINT REPLACEMENT Right 2006   knee  . TOTAL KNEE ARTHROPLASTY Left 08/06/2014   Procedure: LEFT TOTAL KNEE ARTHROPLASTY;  Surgeon: Gaynelle Arabian, MD;  Location: [facility];  Service: Orthopedics;  Laterality: Left;  . TUBAL LIGATION  1979     Current Outpatient Medications  Medication Sig Dispense Refill  . acetaminophen (TYLENOL) 325 MG tablet Take 2 tablets (650 mg total) by mouth every 6 (six) hours as needed for mild pain (  or Fever >/= 101). 12 tablet 0  . albuterol (PROVENTIL) (2.5 MG/3ML) 0.083% nebulizer solution Take 3 mLs (2.5 mg total) by nebulization every 6 (six) hours as needed for wheezing or shortness of breath. 75 mL 12  . amLODipine (NORVASC) 10 MG tablet Take 1 tablet (10 mg total) by mouth daily. 30 tablet 3  . carvedilol (COREG) 3.125 MG tablet Take 1 tablet (3.125 mg total) by mouth 2 (two) times daily. 60 tablet 2  . cefdinir (OMNICEF) 300 MG capsule Take 1 capsule (300 mg total) by mouth 2 (two) times daily for 7 days. 14 capsule 0  . furosemide (LASIX) 40 MG tablet Take 1 tablet (40 mg total) by mouth daily. 90 tablet 1  . gabapentin (NEURONTIN) 100 MG capsule Take 1 capsule (100 mg total) by mouth 2  (two) times daily. 60 capsule 3  . Insulin Glargine (LANTUS SOLOSTAR) 100 UNIT/ML Solostar Pen Inject 25 Units into the skin at bedtime. 15 mL 1  . lisinopril (ZESTRIL) 20 MG tablet Take 1 tablet (20 mg total) by mouth daily. 90 tablet 2  . metFORMIN (GLUCOPHAGE) 1000 MG tablet Take 1 tablet (1,000 mg total) by mouth 2 (two) times daily. 180 tablet 2  . methocarbamol (ROBAXIN) 500 MG tablet Take 500 mg by mouth every 6 (six) hours as needed for muscle spasms. Reported on 09/25/2015    . potassium chloride (KLOR-CON 10) 10 MEQ tablet Take 1 tablet (10 mEq total) by mouth daily. 30 tablet 0  . pramipexole (MIRAPEX) 1 MG tablet Take 1 tablet (1 mg total) by mouth 2 (two) times daily. 180 tablet 0  . predniSONE (DELTASONE) 20 MG tablet Take 1 tablet (20 mg total) by mouth daily with breakfast. 5 tablet 0   No current facility-administered medications for this visit.    Allergies:   Sulfa antibiotics, Sulfonamide derivatives, and Other    Social History:  The patient  reports that she has never smoked. She has never used smokeless tobacco. She reports that she does not drink alcohol or use drugs.   Family History:  The patient's family history includes Alcohol abuse in her father; Cancer in her sister; Diabetes in an other family member; Heart disease in her brother and paternal grandfather; Obesity in an other family member.    ROS: All other systems are reviewed and negative. Unless otherwise mentioned in H&P    PHYSICAL EXAM: VS:  BP (!) 150/80   Pulse 84   Ht 5' (1.524 m)   Wt 217 lb (98.4 kg)   SpO2 94%   BMI 42.38 kg/m  , BMI Body mass index is 42.38 kg/m. GEN: Well nourished, well developed, in no acute distress, obese, frail HEENT: normal Neck: no JVD, carotid bruits, or masses Cardiac: RRR; no murmurs, rubs, or gallops, 2+ ankle and 1+ pretibial dependent edema  Respiratory:  Clear to auscultation bilaterally, normal work of breathing, wearing oxygen via nasal cannula GI:  soft, nontender, nondistended, + BS MS: no deformity or atrophy, diminished strength using walker for ambulation. Skin: warm and dry, no rash Neuro:  Strength and sensation are intact Psych: euthymic mood, full affect   EKG: Not completed this office visit  Recent Labs: 12/27/2018: ALT 21 07/07/2019: B Natriuretic Peptide 53.0 07/08/2019: TSH 0.902 07/18/2019: BUN 25; Creatinine, Ser 0.68; Hemoglobin 12.9; Platelets 288.0; Potassium 3.6; Sodium 139    Lipid Panel    Component Value Date/Time   CHOL 136 12/27/2018 1037   TRIG 113.0 12/27/2018 1037   HDL  34.30 (L) 12/27/2018 1037   CHOLHDL 4 12/27/2018 1037   VLDL 22.6 12/27/2018 1037   LDLCALC 79 12/27/2018 1037   LDLDIRECT 111.6 02/14/2010 1406      Wt Readings from Last 3 Encounters:  07/24/19 217 lb (98.4 kg)  07/18/19 214 lb (97.1 kg)  07/10/19 210 lb 8.6 oz (95.5 kg)      Other studies Reviewed: Echocardiogram 07/24/2019  1. Left ventricular ejection fraction, by estimation, is 60 to 65%. The  left ventricle has normal function. The left ventricle has no regional  wall motion abnormalities. There is mild to moderate left ventricular  hypertrophy. Left ventricular diastolic  parameters are consistent with Grade I diastolic dysfunction (impaired  relaxation).  2. Right ventricular systolic function is normal. The right ventricular  size is normal. Tricuspid regurgitation signal is inadequate for assessing  PA pressure.  3. The mitral valve is grossly normal, there is mild to moderate annular  calcification. Trivial mitral valve regurgitation.  4. The aortic valve was not well visualized. Mild aortic leaflet  calcification. Aortic valve regurgitation is mild.  5. IVC dilated, but not able to estimate CVP (no sniff).    ASSESSMENT AND PLAN:  1.  Chronic diastolic CHF: Status post hospitalization for same requiring IV diuresis.  She remains on furosemide 40 mg daily with some lower extremity edema noted in the  dependent position.  I doubt this is related to fluid overload.  She is on amlodipine or hypertension management which may be contributing to her dependent edema.  Other than lower extremity dependent edema I do not see any overt evidence of CHF.  She will continue on current medication regimen at this time.  We will have her follow-up with [place] in about 3 months for ongoing management.  We will see her sooner should she gain weight or become more symptomatic.  2.  O2 dependent respiratory failure: She is being followed by pulmonology who she is due to see tomorrow.  Defer to pulmonology concerning O2 management.  3.  Hypertension: She continues on amlodipine, lisinopril, and carvedilol 3.125 mg twice daily.  May need to change to cardiac specific beta-blocker should she have discomfort in her chest related to bronchospasms.  Currently blood pressure remains elevated.  I have rechecked it here in the office and it actually increased to 158/68.  May need to adjust medications on next appointment should this remain elevated or when she is seen by pulmonology tomorrow.  4.  Insulin-dependent diabetes: Followed by PCP.  5.  OSA: Continues on CPAP.  Followed by pulmonary.   Current medicines are reviewed at length with the patient today.  I have spent 30 minutes dedicated to the care of this patient on the date of this encounter to include pre-visit review of records, assessment, management and diagnostic testing,with shared decision making.  Labs/ tests ordered today include: None  Phill Myron. West Pugh, ANP, AACC   07/24/2019 4:20 PM    [facility] [place] [address] Office [phone] Fax [phone]  Notice: This dictation was prepared with Dragon dictation along with smaller phrase technology. Any transcriptional errors that result from this process are unintentional and may not be corrected upon review.

## 2019-07-24 ENCOUNTER — Other Ambulatory Visit: Payer: Self-pay

## 2019-07-24 ENCOUNTER — Ambulatory Visit: Payer: Medicare HMO | Admitting: Adult Health

## 2019-07-24 ENCOUNTER — Encounter: Payer: Self-pay | Admitting: Adult Health

## 2019-07-24 VITALS — BP 150/80 | HR 84 | Ht 60.0 in | Wt 217.0 lb

## 2019-07-24 DIAGNOSIS — R0602 Shortness of breath: Secondary | ICD-10-CM

## 2019-07-24 DIAGNOSIS — E118 Type 2 diabetes mellitus with unspecified complications: Secondary | ICD-10-CM | POA: Diagnosis not present

## 2019-07-24 DIAGNOSIS — I5032 Chronic diastolic (congestive) heart failure: Secondary | ICD-10-CM | POA: Diagnosis not present

## 2019-07-24 DIAGNOSIS — Z794 Long term (current) use of insulin: Secondary | ICD-10-CM | POA: Diagnosis not present

## 2019-07-24 DIAGNOSIS — G4733 Obstructive sleep apnea (adult) (pediatric): Secondary | ICD-10-CM | POA: Diagnosis not present

## 2019-07-24 DIAGNOSIS — I10 Essential (primary) hypertension: Secondary | ICD-10-CM

## 2019-07-24 DIAGNOSIS — R0902 Hypoxemia: Secondary | ICD-10-CM

## 2019-07-24 NOTE — Patient Instructions (Signed)
Medication Instructions:  Continue current medications  *If you need a refill on your cardiac medications before your next appointment, please call your pharmacy*   Lab Work: None Ordered   Testing/Procedures: None Ordered   Follow-Up: At [facility], you and your health needs are our priority.  As part of our continuing mission to provide you with exceptional heart care, we have created designated Provider Care Teams.  These Care Teams include your primary Cardiologist (physician) and Advanced Practice Providers (APPs -  Physician Assistants and Nurse Practitioners) who all work together to provide you with the care you need, when you need it.  We recommend signing up for the patient portal called "MyChart".  Sign up information is provided on this After Visit Summary.  MyChart is used to connect with patients for Virtual Visits (Telemedicine).  Patients are able to view lab/test results, encounter notes, upcoming appointments, etc.  Non-urgent messages can be sent to your provider as well.   To learn more about what you can do with MyChart, go to [email or web address].    Your next appointment:   6 week(s)  The format for your next appointment:   In Person  Provider:   Jory Sims, DNP, ANP

## 2019-07-25 ENCOUNTER — Ambulatory Visit: Payer: Medicare HMO | Admitting: Pulmonary Disease

## 2019-07-25 ENCOUNTER — Encounter: Payer: Self-pay | Admitting: Pulmonary Disease

## 2019-07-25 DIAGNOSIS — G4733 Obstructive sleep apnea (adult) (pediatric): Secondary | ICD-10-CM | POA: Diagnosis not present

## 2019-07-25 DIAGNOSIS — I5033 Acute on chronic diastolic (congestive) heart failure: Secondary | ICD-10-CM | POA: Diagnosis not present

## 2019-07-25 DIAGNOSIS — J9611 Chronic respiratory failure with hypoxia: Secondary | ICD-10-CM | POA: Diagnosis not present

## 2019-07-25 DIAGNOSIS — F330 Major depressive disorder, recurrent, mild: Secondary | ICD-10-CM | POA: Diagnosis not present

## 2019-07-25 DIAGNOSIS — M19011 Primary osteoarthritis, right shoulder: Secondary | ICD-10-CM | POA: Diagnosis not present

## 2019-07-25 DIAGNOSIS — G2581 Restless legs syndrome: Secondary | ICD-10-CM | POA: Diagnosis not present

## 2019-07-25 DIAGNOSIS — I5032 Chronic diastolic (congestive) heart failure: Secondary | ICD-10-CM | POA: Diagnosis not present

## 2019-07-25 DIAGNOSIS — J9612 Chronic respiratory failure with hypercapnia: Secondary | ICD-10-CM | POA: Diagnosis not present

## 2019-07-25 DIAGNOSIS — E1140 Type 2 diabetes mellitus with diabetic neuropathy, unspecified: Secondary | ICD-10-CM | POA: Diagnosis not present

## 2019-07-25 DIAGNOSIS — J9621 Acute and chronic respiratory failure with hypoxia: Secondary | ICD-10-CM | POA: Diagnosis not present

## 2019-07-25 DIAGNOSIS — F419 Anxiety disorder, unspecified: Secondary | ICD-10-CM | POA: Diagnosis not present

## 2019-07-25 DIAGNOSIS — I110 Hypertensive heart disease with heart failure: Secondary | ICD-10-CM | POA: Diagnosis not present

## 2019-07-25 DIAGNOSIS — J9622 Acute and chronic respiratory failure with hypercapnia: Secondary | ICD-10-CM | POA: Diagnosis not present

## 2019-07-25 NOTE — Assessment & Plan Note (Addendum)
She had a recent hospital admission for acute on chronic respiratory acidosis.  She improved with BiPAP She has not tolerated CPAP in the past.  She clearly has chronic hypercarbia which is on the basis of obesity hypoventilation and chronic diastolic heart failure. 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  We have been unable to get her PAP. Interruption or failure to provide NIV would quickly lead to exacerbation of the patient's condition, hospital readmission, and likely harm the patient.  Continued use is preferred.  The use of the NIV will treat the patient's PCO2 levels and can reduce the risk of exacerbations and future hospitalizations when used at night and during the day.  Bilevel/RAD therapy with and without a rate would be ineffective as the patient requires a volume targeted mode.  Ventilation is required to decrease the work of breathing and improve pulmonary status.  Interruption of ventilator support would lead to a decline of health status.  Patient is able to protect their airways and clear secretions on their own.

## 2019-07-25 NOTE — Assessment & Plan Note (Signed)
Continue Lasix. Monitor daily weights

## 2019-07-25 NOTE — Patient Instructions (Signed)
Prescription will be sent to DME for new PAP machine Continue to use the oxygen, maintain saturation 90% and above Stay on Lasix daily

## 2019-07-25 NOTE — Assessment & Plan Note (Addendum)
She clearly has severe OSA by history and by her prior sleep study Unfortunately her sleep habits are so poor that it would be difficult to study her again in the lab, if absolutely needed we could do a home sleep test but I feel that she has already demonstrated need for PAP by her clinical deterioration without it She is now willing to use it since she realizes how necessary this is for her   compliance with goal of at least 4-6 hrs every night is the expectation. Advised against medications with sedative side effects She does not drive

## 2019-07-25 NOTE — Progress Notes (Signed)
Subjective:    Patient ID: [name], female    DOB: [birth year], 79 y.o.   MRN: [id number]  HPI  79 year old woman who was last seen 07/2016 in our office for OSA presents to reestablish care She was diagnosed with severe OSA in 2017 and started on auto CPAP 5 to 15 cm, follow-up visit in 2018 showed good results on CPAP download  Chief Complaint  Patient presents with  . Consult    Acute respiratory failure with hypoxia and hypercapnia, Obstructive Sleep Apnea - has not used CPAP in over a year.   She was admitted 2/19-2/23 after being sent from her ophthalmologist office with low oxygen level, she was scheduled for planned cataract extraction was found to have an 81% saturation on room air, she tested negative for Covid, portable chest x-ray showed venous congestion ABG was 7.2 9/83/81 on 4 L nasal cannula>> acute respiratory acidosis, placed on BiPAP, diuresed with Lasix, E. coli UTI was treated with Rocephin, drowsiness related to hypercarbia improved  She arrives, ambulating with a walker, history corroborated from her son, she is very hard of hearing.  She is compliant with her Lasix, this makes her urinate. She is using an urgent tank set at 3 pulse, which is obtained from her daughter's mother-in-law She is afraid that the lung oxygen hose that she has with her oxygen concentrator is going to make her or her husband trip.  She wonders if she can do without the oxygen.  On ambulation oxygen saturation dropped from 95 to 92% on 2 laps walking at her own pace.  She is hopeful that she can go on a trip to [place] this summer Epworth sleepiness score is 22. Bedtime is anytime between 5:58 AM.  She is afraid of going to sleep , her head of bed is raised 5 inches, she sleeps on her side often gets out of bed in the middle of the night and sits on a chair she has several nocturnal awakenings all through the night, out of bed between 7:54 AM. She has gained 27 pounds over the last 3  years  Significant tests/ events reviewed Echo 06/2019 >> normal LV function, RV was not dilated, IVC dilated  03/13/2016 NPSG >>severe sleep apnea with AHI of 61/Hr . TST 34 minutes only    I reviewed hospital discharge summary, labs and imaging studies  Past Medical History:  Diagnosis Date  . AK (actinic keratosis)   . Anxiety    occasional  . Breast cancer ([place])    R breast, s/p radiation 34Gy/39fx 04/29/10-05/05/10  . CTS (carpal tunnel syndrome)    Left  . Diabetes ([place])   . Diastolic CHF ([place])    Dx w/ hospitalization 06/2015 for respiratory failure  . Facial asymmetry, acquired 08/20/2016   From injury  . Falls frequently    "can not pick left leg up"  . Hearing loss    bilateral hearing aides  . History of environmental allergies   . History of kidney stones 20 years ago  . Hypertension   . MDD (major depressive disorder)   . No blood products ([place])  09/11/2013  . Osteoarthritis    hx shoulder, knee, back, wrist pain; saw Dr. Wynelle Link   . Personal history of radiation therapy   . RESTLESS LEGS SYNDROME 05/05/2007   Qualifier: Diagnosis of  By: Gwenette Greet MD, Armando Reichert   . RLS (restless legs syndrome)   . Sleep apnea      Past Surgical  History:  Procedure Laterality Date  . APPENDECTOMY  age 69  . BREAST LUMPECTOMY Right 2012  . BREAST SURGERY  2012   rt breast lumpectomy  . flex signoidoscopy    . INCISION AND DRAINAGE PERIRECTAL ABSCESS    . JOINT REPLACEMENT Right 2006   knee  . TOTAL KNEE ARTHROPLASTY Left 08/06/2014   Procedure: LEFT TOTAL KNEE ARTHROPLASTY;  Surgeon: Gaynelle Arabian, MD;  Location: [facility];  Service: Orthopedics;  Laterality: Left;  . TUBAL LIGATION  1979    Allergies  Allergen Reactions  . Sulfa Antibiotics Swelling    Other reaction(s): Facial Swelling "swelling lips"   . Sulfonamide Derivatives Swelling    Lips Swelling  . Other     BLOOD PRODUCT REFUSAL    Social History   Socioeconomic History  . Marital status:  Married    Spouse name: Not on file  . Number of children: 3  . Years of education: Not on file  . Highest education level: Not on file  Occupational History  . Not on file  Tobacco Use  . Smoking status: Never Smoker  . Smokeless tobacco: Never Used  Substance and Sexual Activity  . Alcohol use: No  . Drug use: No  . Sexual activity: Yes  Other Topics Concern  . Not on file  Social History Narrative         Home Situation: lives with husband and granddaughter      Spiritual Beliefs: [employer] - no blood products               Social Determinants of Health   Financial Resource Strain:   . Difficulty of Paying Living Expenses: Not on file  Food Insecurity:   . Worried About Charity fundraiser in the Last Year: Not on file  . Ran Out of Food in the Last Year: Not on file  Transportation Needs:   . Lack of Transportation (Medical): Not on file  . Lack of Transportation (Non-Medical): Not on file  Physical Activity:   . Days of Exercise per Week: Not on file  . Minutes of Exercise per Session: Not on file  Stress:   . Feeling of Stress : Not on file  Social Connections:   . Frequency of Communication with Friends and Family: Not on file  . Frequency of Social Gatherings with Friends and Family: Not on file  . Attends Religious Services: Not on file  . Active Member of Clubs or Organizations: Not on file  . Attends Archivist Meetings: Not on file  . Marital Status: Not on file  Intimate Partner Violence:   . Fear of Current or Ex-Partner: Not on file  . Emotionally Abused: Not on file  . Physically Abused: Not on file  . Sexually Abused: Not on file     Family History  Problem Relation Age of Onset  . Alcohol abuse Father   . Cancer Sister        breast  . Heart disease Paternal Grandfather   . Diabetes Other   . Obesity Other   . Heart disease Brother        Valve      Review of Systems Constitutional: negative for anorexia, fevers  and sweats  Eyes: negative for irritation, redness and visual disturbance  Ears, nose, mouth, throat, and face: negative for earaches, epistaxis, nasal congestion and sore throat  Respiratory: negative for cough,, sputum and wheezing  Cardiovascular: negative for chest pain,  orthopnea,  palpitations and syncope  Gastrointestinal: negative for abdominal pain, constipation, diarrhea, melena, nausea and vomiting  Genitourinary:negative for dysuria, frequency and hematuria  Hematologic/lymphatic: negative for bleeding, easy bruising and lymphadenopathy  Musculoskeletal:negative for arthralgias, muscle weakness and stiff joints  Neurological: negative for coordination problems, gait problems, headaches and weakness  Endocrine: negative for diabetic symptoms including polydipsia, polyuria and weight loss     Objective:   Physical Exam  Gen. Pleasant, obese, in no distress, normal affect ENT - no pallor,icterus, no post nasal drip, class 2-3 airway Neck: No JVD, no thyromegaly, no carotid bruits Lungs: no use of accessory muscles, no dullness to percussion, decreased without rales or rhonchi  Cardiovascular: Rhythm regular, heart sounds  normal, no murmurs or gallops, no peripheral edema Abdomen: soft and non-tender, no hepatosplenomegaly, BS normal. Musculoskeletal: No deformities, no cyanosis or clubbing Neuro:  alert, non focal, no tremors       Assessment & Plan:

## 2019-07-26 ENCOUNTER — Telehealth: Payer: Self-pay | Admitting: Pulmonary Disease

## 2019-07-26 DIAGNOSIS — I5033 Acute on chronic diastolic (congestive) heart failure: Secondary | ICD-10-CM | POA: Diagnosis not present

## 2019-07-26 DIAGNOSIS — G2581 Restless legs syndrome: Secondary | ICD-10-CM | POA: Diagnosis not present

## 2019-07-26 DIAGNOSIS — I110 Hypertensive heart disease with heart failure: Secondary | ICD-10-CM | POA: Diagnosis not present

## 2019-07-26 DIAGNOSIS — M19011 Primary osteoarthritis, right shoulder: Secondary | ICD-10-CM | POA: Diagnosis not present

## 2019-07-26 DIAGNOSIS — F419 Anxiety disorder, unspecified: Secondary | ICD-10-CM | POA: Diagnosis not present

## 2019-07-26 DIAGNOSIS — J9621 Acute and chronic respiratory failure with hypoxia: Secondary | ICD-10-CM | POA: Diagnosis not present

## 2019-07-26 DIAGNOSIS — F330 Major depressive disorder, recurrent, mild: Secondary | ICD-10-CM | POA: Diagnosis not present

## 2019-07-26 DIAGNOSIS — J9622 Acute and chronic respiratory failure with hypercapnia: Secondary | ICD-10-CM | POA: Diagnosis not present

## 2019-07-26 DIAGNOSIS — E1140 Type 2 diabetes mellitus with diabetic neuropathy, unspecified: Secondary | ICD-10-CM | POA: Diagnosis not present

## 2019-07-26 NOTE — Telephone Encounter (Signed)
Dr. Alva, please advise. 

## 2019-07-26 NOTE — Telephone Encounter (Signed)
Response from [employer] for DME BiPap referral:  [name] sent to [name], [name], [facility], unfortunately, there are issues with this referral.  - Chronic Respiratory Failure is not a valid DX for BIPAP.  - Insurance requires that OSA Is being treated before being able to use another DX to aquire BIPAP (COPD is a valid DX). According to notes, she has not been on CPAP for over a year. So she would need to re-qualify under the OSA DX with a new baseline sleep study and titration study starting with CPAP, failing and moving to BIPAP.  The only way we can provide a BIPAP at this time is on a private pay basis as we do not have the proper testing.  NOTE: If her OSA was being treated with CPAP (had she not stopped using), we could use COPD as a DX, but would need ABG on prescribed liter flow of O2 with PCO2 >=52 mm HG; ONO on prescribed liter flow of O2 w/sats at or below 88% for 5 minutes or more and office visit notes that speak of the patient being treated for OSA with CPAP and that her worsening COPD has necessitated the need for a BIPAP.   [name]: Please put in Triage message.

## 2019-07-26 NOTE — Telephone Encounter (Signed)
Please send same order to different DME Provide ABG & my notes please

## 2019-07-27 ENCOUNTER — Ambulatory Visit: Payer: Medicare HMO | Admitting: Adult Health

## 2019-07-27 NOTE — Telephone Encounter (Signed)
CM sent to [facility].

## 2019-07-28 DIAGNOSIS — F419 Anxiety disorder, unspecified: Secondary | ICD-10-CM | POA: Diagnosis not present

## 2019-07-28 DIAGNOSIS — G2581 Restless legs syndrome: Secondary | ICD-10-CM | POA: Diagnosis not present

## 2019-07-28 DIAGNOSIS — E1140 Type 2 diabetes mellitus with diabetic neuropathy, unspecified: Secondary | ICD-10-CM | POA: Diagnosis not present

## 2019-07-28 DIAGNOSIS — J9622 Acute and chronic respiratory failure with hypercapnia: Secondary | ICD-10-CM | POA: Diagnosis not present

## 2019-07-28 DIAGNOSIS — I5033 Acute on chronic diastolic (congestive) heart failure: Secondary | ICD-10-CM | POA: Diagnosis not present

## 2019-07-28 DIAGNOSIS — M19011 Primary osteoarthritis, right shoulder: Secondary | ICD-10-CM | POA: Diagnosis not present

## 2019-07-28 DIAGNOSIS — J9621 Acute and chronic respiratory failure with hypoxia: Secondary | ICD-10-CM | POA: Diagnosis not present

## 2019-07-28 DIAGNOSIS — I110 Hypertensive heart disease with heart failure: Secondary | ICD-10-CM | POA: Diagnosis not present

## 2019-07-28 DIAGNOSIS — F330 Major depressive disorder, recurrent, mild: Secondary | ICD-10-CM | POA: Diagnosis not present

## 2019-07-31 ENCOUNTER — Ambulatory Visit: Payer: Medicare HMO | Admitting: Physician Assistant

## 2019-07-31 DIAGNOSIS — I5032 Chronic diastolic (congestive) heart failure: Secondary | ICD-10-CM | POA: Diagnosis not present

## 2019-07-31 DIAGNOSIS — R062 Wheezing: Secondary | ICD-10-CM | POA: Diagnosis not present

## 2019-07-31 DIAGNOSIS — G4733 Obstructive sleep apnea (adult) (pediatric): Secondary | ICD-10-CM | POA: Diagnosis not present

## 2019-08-02 DIAGNOSIS — F419 Anxiety disorder, unspecified: Secondary | ICD-10-CM | POA: Diagnosis not present

## 2019-08-02 DIAGNOSIS — I5033 Acute on chronic diastolic (congestive) heart failure: Secondary | ICD-10-CM | POA: Diagnosis not present

## 2019-08-02 DIAGNOSIS — G2581 Restless legs syndrome: Secondary | ICD-10-CM | POA: Diagnosis not present

## 2019-08-02 DIAGNOSIS — J9621 Acute and chronic respiratory failure with hypoxia: Secondary | ICD-10-CM | POA: Diagnosis not present

## 2019-08-02 DIAGNOSIS — F330 Major depressive disorder, recurrent, mild: Secondary | ICD-10-CM | POA: Diagnosis not present

## 2019-08-02 DIAGNOSIS — I110 Hypertensive heart disease with heart failure: Secondary | ICD-10-CM | POA: Diagnosis not present

## 2019-08-02 DIAGNOSIS — M19011 Primary osteoarthritis, right shoulder: Secondary | ICD-10-CM | POA: Diagnosis not present

## 2019-08-02 DIAGNOSIS — E1140 Type 2 diabetes mellitus with diabetic neuropathy, unspecified: Secondary | ICD-10-CM | POA: Diagnosis not present

## 2019-08-02 DIAGNOSIS — J9622 Acute and chronic respiratory failure with hypercapnia: Secondary | ICD-10-CM | POA: Diagnosis not present

## 2019-08-04 ENCOUNTER — Other Ambulatory Visit: Payer: Self-pay | Admitting: Physician Assistant

## 2019-08-04 DIAGNOSIS — E1159 Type 2 diabetes mellitus with other circulatory complications: Secondary | ICD-10-CM

## 2019-08-04 DIAGNOSIS — I152 Hypertension secondary to endocrine disorders: Secondary | ICD-10-CM

## 2019-08-09 DIAGNOSIS — F419 Anxiety disorder, unspecified: Secondary | ICD-10-CM | POA: Diagnosis not present

## 2019-08-09 DIAGNOSIS — I5033 Acute on chronic diastolic (congestive) heart failure: Secondary | ICD-10-CM | POA: Diagnosis not present

## 2019-08-09 DIAGNOSIS — G2581 Restless legs syndrome: Secondary | ICD-10-CM | POA: Diagnosis not present

## 2019-08-09 DIAGNOSIS — J9622 Acute and chronic respiratory failure with hypercapnia: Secondary | ICD-10-CM | POA: Diagnosis not present

## 2019-08-09 DIAGNOSIS — M19011 Primary osteoarthritis, right shoulder: Secondary | ICD-10-CM | POA: Diagnosis not present

## 2019-08-09 DIAGNOSIS — J9621 Acute and chronic respiratory failure with hypoxia: Secondary | ICD-10-CM | POA: Diagnosis not present

## 2019-08-09 DIAGNOSIS — F330 Major depressive disorder, recurrent, mild: Secondary | ICD-10-CM | POA: Diagnosis not present

## 2019-08-09 DIAGNOSIS — I110 Hypertensive heart disease with heart failure: Secondary | ICD-10-CM | POA: Diagnosis not present

## 2019-08-09 DIAGNOSIS — E1140 Type 2 diabetes mellitus with diabetic neuropathy, unspecified: Secondary | ICD-10-CM | POA: Diagnosis not present

## 2019-08-11 ENCOUNTER — Telehealth: Payer: Self-pay | Admitting: Physician Assistant

## 2019-08-11 DIAGNOSIS — M47819 Spondylosis without myelopathy or radiculopathy, site unspecified: Secondary | ICD-10-CM

## 2019-08-11 DIAGNOSIS — Z7952 Long term (current) use of systemic steroids: Secondary | ICD-10-CM

## 2019-08-11 DIAGNOSIS — M6289 Other specified disorders of muscle: Secondary | ICD-10-CM

## 2019-08-11 DIAGNOSIS — Z9181 History of falling: Secondary | ICD-10-CM

## 2019-08-11 DIAGNOSIS — F419 Anxiety disorder, unspecified: Secondary | ICD-10-CM

## 2019-08-11 DIAGNOSIS — Z1152 Encounter for screening for COVID-19: Secondary | ICD-10-CM

## 2019-08-11 DIAGNOSIS — Z853 Personal history of malignant neoplasm of breast: Secondary | ICD-10-CM

## 2019-08-11 DIAGNOSIS — M19012 Primary osteoarthritis, left shoulder: Secondary | ICD-10-CM

## 2019-08-11 DIAGNOSIS — F330 Major depressive disorder, recurrent, mild: Secondary | ICD-10-CM

## 2019-08-11 DIAGNOSIS — G2581 Restless legs syndrome: Secondary | ICD-10-CM

## 2019-08-11 DIAGNOSIS — J9621 Acute and chronic respiratory failure with hypoxia: Secondary | ICD-10-CM | POA: Diagnosis not present

## 2019-08-11 DIAGNOSIS — Z96653 Presence of artificial knee joint, bilateral: Secondary | ICD-10-CM

## 2019-08-11 DIAGNOSIS — H9193 Unspecified hearing loss, bilateral: Secondary | ICD-10-CM

## 2019-08-11 DIAGNOSIS — M19011 Primary osteoarthritis, right shoulder: Secondary | ICD-10-CM

## 2019-08-11 DIAGNOSIS — E1140 Type 2 diabetes mellitus with diabetic neuropathy, unspecified: Secondary | ICD-10-CM

## 2019-08-11 DIAGNOSIS — I5033 Acute on chronic diastolic (congestive) heart failure: Secondary | ICD-10-CM | POA: Diagnosis not present

## 2019-08-11 DIAGNOSIS — J9622 Acute and chronic respiratory failure with hypercapnia: Secondary | ICD-10-CM | POA: Diagnosis not present

## 2019-08-11 DIAGNOSIS — I110 Hypertensive heart disease with heart failure: Secondary | ICD-10-CM | POA: Diagnosis not present

## 2019-08-11 DIAGNOSIS — Z794 Long term (current) use of insulin: Secondary | ICD-10-CM

## 2019-08-11 NOTE — Telephone Encounter (Signed)
[name] picked up forms, faxed them, and sent to scan. °

## 2019-08-11 NOTE — Telephone Encounter (Signed)
FYI

## 2019-08-11 NOTE — Telephone Encounter (Signed)
I have placed a home health cert. And plan of care. In the bin up front with a charge sheet.

## 2019-08-11 NOTE — Telephone Encounter (Signed)
Form completed and placed in basket  

## 2019-08-11 NOTE — Telephone Encounter (Signed)
Paperwork given to Covering PCP

## 2019-08-12 ENCOUNTER — Other Ambulatory Visit: Payer: Self-pay | Admitting: Family Medicine

## 2019-08-13 DIAGNOSIS — M19011 Primary osteoarthritis, right shoulder: Secondary | ICD-10-CM | POA: Diagnosis not present

## 2019-08-13 DIAGNOSIS — E1140 Type 2 diabetes mellitus with diabetic neuropathy, unspecified: Secondary | ICD-10-CM | POA: Diagnosis not present

## 2019-08-13 DIAGNOSIS — F419 Anxiety disorder, unspecified: Secondary | ICD-10-CM | POA: Diagnosis not present

## 2019-08-13 DIAGNOSIS — J9621 Acute and chronic respiratory failure with hypoxia: Secondary | ICD-10-CM | POA: Diagnosis not present

## 2019-08-13 DIAGNOSIS — I5033 Acute on chronic diastolic (congestive) heart failure: Secondary | ICD-10-CM | POA: Diagnosis not present

## 2019-08-13 DIAGNOSIS — F330 Major depressive disorder, recurrent, mild: Secondary | ICD-10-CM | POA: Diagnosis not present

## 2019-08-13 DIAGNOSIS — G2581 Restless legs syndrome: Secondary | ICD-10-CM | POA: Diagnosis not present

## 2019-08-13 DIAGNOSIS — I110 Hypertensive heart disease with heart failure: Secondary | ICD-10-CM | POA: Diagnosis not present

## 2019-08-13 DIAGNOSIS — J9622 Acute and chronic respiratory failure with hypercapnia: Secondary | ICD-10-CM | POA: Diagnosis not present

## 2019-08-15 DIAGNOSIS — J9621 Acute and chronic respiratory failure with hypoxia: Secondary | ICD-10-CM | POA: Diagnosis not present

## 2019-08-15 DIAGNOSIS — F419 Anxiety disorder, unspecified: Secondary | ICD-10-CM | POA: Diagnosis not present

## 2019-08-15 DIAGNOSIS — M19011 Primary osteoarthritis, right shoulder: Secondary | ICD-10-CM | POA: Diagnosis not present

## 2019-08-15 DIAGNOSIS — I5033 Acute on chronic diastolic (congestive) heart failure: Secondary | ICD-10-CM | POA: Diagnosis not present

## 2019-08-15 DIAGNOSIS — E1140 Type 2 diabetes mellitus with diabetic neuropathy, unspecified: Secondary | ICD-10-CM | POA: Diagnosis not present

## 2019-08-15 DIAGNOSIS — J9622 Acute and chronic respiratory failure with hypercapnia: Secondary | ICD-10-CM | POA: Diagnosis not present

## 2019-08-15 DIAGNOSIS — F330 Major depressive disorder, recurrent, mild: Secondary | ICD-10-CM | POA: Diagnosis not present

## 2019-08-15 DIAGNOSIS — G2581 Restless legs syndrome: Secondary | ICD-10-CM | POA: Diagnosis not present

## 2019-08-15 DIAGNOSIS — I110 Hypertensive heart disease with heart failure: Secondary | ICD-10-CM | POA: Diagnosis not present

## 2019-08-23 ENCOUNTER — Other Ambulatory Visit: Payer: Self-pay | Admitting: Physician Assistant

## 2019-08-23 ENCOUNTER — Other Ambulatory Visit: Payer: Self-pay | Admitting: Cardiology

## 2019-08-23 DIAGNOSIS — I152 Hypertension secondary to endocrine disorders: Secondary | ICD-10-CM

## 2019-08-23 DIAGNOSIS — E1159 Type 2 diabetes mellitus with other circulatory complications: Secondary | ICD-10-CM

## 2019-08-23 DIAGNOSIS — E1140 Type 2 diabetes mellitus with diabetic neuropathy, unspecified: Secondary | ICD-10-CM | POA: Diagnosis not present

## 2019-08-23 DIAGNOSIS — G2581 Restless legs syndrome: Secondary | ICD-10-CM | POA: Diagnosis not present

## 2019-08-23 DIAGNOSIS — M19011 Primary osteoarthritis, right shoulder: Secondary | ICD-10-CM | POA: Diagnosis not present

## 2019-08-23 DIAGNOSIS — J9621 Acute and chronic respiratory failure with hypoxia: Secondary | ICD-10-CM | POA: Diagnosis not present

## 2019-08-23 DIAGNOSIS — I5033 Acute on chronic diastolic (congestive) heart failure: Secondary | ICD-10-CM | POA: Diagnosis not present

## 2019-08-23 DIAGNOSIS — F330 Major depressive disorder, recurrent, mild: Secondary | ICD-10-CM | POA: Diagnosis not present

## 2019-08-23 DIAGNOSIS — I110 Hypertensive heart disease with heart failure: Secondary | ICD-10-CM | POA: Diagnosis not present

## 2019-08-23 DIAGNOSIS — I10 Essential (primary) hypertension: Secondary | ICD-10-CM

## 2019-08-23 DIAGNOSIS — J9622 Acute and chronic respiratory failure with hypercapnia: Secondary | ICD-10-CM | POA: Diagnosis not present

## 2019-08-23 DIAGNOSIS — F419 Anxiety disorder, unspecified: Secondary | ICD-10-CM | POA: Diagnosis not present

## 2019-08-25 ENCOUNTER — Encounter: Payer: Self-pay | Admitting: Adult Health

## 2019-08-25 ENCOUNTER — Other Ambulatory Visit: Payer: Self-pay

## 2019-08-25 ENCOUNTER — Ambulatory Visit: Payer: Medicare HMO | Admitting: Adult Health

## 2019-08-25 DIAGNOSIS — G4733 Obstructive sleep apnea (adult) (pediatric): Secondary | ICD-10-CM | POA: Diagnosis not present

## 2019-08-25 DIAGNOSIS — J9611 Chronic respiratory failure with hypoxia: Secondary | ICD-10-CM | POA: Diagnosis not present

## 2019-08-25 DIAGNOSIS — J9612 Chronic respiratory failure with hypercapnia: Secondary | ICD-10-CM | POA: Diagnosis not present

## 2019-08-25 DIAGNOSIS — I5032 Chronic diastolic (congestive) heart failure: Secondary | ICD-10-CM | POA: Diagnosis not present

## 2019-08-25 NOTE — Progress Notes (Signed)
@Patient  ID: [name], female    DOB: 19-Apr-1941, 79 y.o.   MRN: [id number]  Chief Complaint  Patient presents with  . Follow-up    Referring provider: Delorse Limber  HPI: 79 year old female followed for severe obstructive sleep apnea  TEST/EVENTS :  Significant tests/ events reviewed Echo 06/2019 >> normal LV function, RV was not dilated, IVC dilated  03/13/2016 NPSG >>severe sleep apnea with AHI of 61/Hr .TST 34 minutes only   08/25/2019 Follow up : OSA  Patient returns for a 1 month follow-up.  Patient was seen last visit to reestablish for severe obstructive sleep apnea.  She was admitted in February 2021 with acute hypoxic and hypercarbic respiratory failure.  She required initial BiPAP support.  Patient had significant hypersomnolence with related to her hypercarbia.  Last visit patient was recommended to begin BiPAP.  With oxygen.  Unfortunately patient has not received this.  We have contacted DME company to make sure she receives this    Never got bipap   Allergies  Allergen Reactions  . Sulfa Antibiotics Swelling    Other reaction(s): Facial Swelling "swelling lips"   . Sulfonamide Derivatives Swelling    Lips Swelling  . Other     BLOOD PRODUCT REFUSAL    Immunization History  Administered Date(s) Administered  . Fluad Quad(high Dose 65+) 01/24/2019  . Influenza Split 02/20/2011, 01/25/2012  . Influenza Whole 02/16/1999, 02/25/2007, 03/07/2009, 02/14/2010  . Influenza, High Dose Seasonal PF 02/17/2016, 03/23/2017, 01/20/2018  . Influenza,inj,Quad PF,6+ Mos 02/01/2013, 02/19/2014, 01/30/2015  . Influenza,inj,quad, With Preservative 02/15/2017  . PFIZER SARS-COV-2 Vaccination 06/06/2019, 06/26/2019  . Pneumococcal Conjugate-13 01/30/2015  . Pneumococcal Polysaccharide-23 10/11/2013  . Td 05/18/2005  . Tdap 10/21/2015  . Zoster 02/20/2011    Past Medical History:  Diagnosis Date  . AK (actinic keratosis)   . Anxiety    occasional  .  Breast cancer ([place])    R breast, s/p radiation 34Gy/28fx 04/29/10-05/05/10  . CTS (carpal tunnel syndrome)    Left  . Diabetes ([place])   . Diastolic CHF ([place])    Dx w/ hospitalization 06/2015 for respiratory failure  . Facial asymmetry, acquired 08/20/2016   From injury  . Falls frequently    "can not pick left leg up"  . Hearing loss    bilateral hearing aides  . History of environmental allergies   . History of kidney stones 20 years ago  . Hypertension   . MDD (major depressive disorder)   . No blood products ([place])  09/11/2013  . Osteoarthritis    hx shoulder, knee, back, wrist pain; saw Dr. Wynelle Link   . Personal history of radiation therapy   . RESTLESS LEGS SYNDROME 05/05/2007   Qualifier: Diagnosis of  By: Gwenette Greet MD, Armando Reichert   . RLS (restless legs syndrome)   . Sleep apnea     Tobacco History: Social History   Tobacco Use  Smoking Status Never Smoker  Smokeless Tobacco Never Used   Counseling given: Not Answered   Outpatient Medications Prior to Visit  Medication Sig Dispense Refill  . acetaminophen (TYLENOL) 325 MG tablet Take 2 tablets (650 mg total) by mouth every 6 (six) hours as needed for mild pain (or Fever >/= 101). 12 tablet 0  . albuterol (PROVENTIL) (2.5 MG/3ML) 0.083% nebulizer solution Take 3 mLs (2.5 mg total) by nebulization every 6 (six) hours as needed for wheezing or shortness of breath. 75 mL 12  . amLODipine (NORVASC) 10 MG tablet Take  1 tablet (10 mg total) by mouth daily. 30 tablet 3  . carvedilol (COREG) 3.125 MG tablet Take 1 tablet (3.125 mg total) by mouth 2 (two) times daily. 60 tablet 2  . furosemide (LASIX) 40 MG tablet TAKE 1 TABLET (40 MG TOTAL) BY MOUTH DAILY. 90 tablet 1  . gabapentin (NEURONTIN) 100 MG capsule Take 1 capsule by mouth twice daily 60 capsule 6  . Insulin Glargine (LANTUS SOLOSTAR) 100 UNIT/ML Solostar Pen Inject 25 Units into the skin at bedtime. 15 mL 1  . lisinopril (ZESTRIL) 20 MG tablet TAKE 1 TABLET (20 MG  TOTAL) BY MOUTH DAILY. 90 tablet 2  . metFORMIN (GLUCOPHAGE) 1000 MG tablet Take 1 tablet (1,000 mg total) by mouth 2 (two) times daily. 180 tablet 2  . methocarbamol (ROBAXIN) 500 MG tablet Take 500 mg by mouth every 6 (six) hours as needed for muscle spasms. Reported on 09/25/2015    . potassium chloride (KLOR-CON) 10 MEQ tablet TAKE 1 TABLET (10 MEQ TOTAL) BY MOUTH DAILY. 90 tablet 3  . pramipexole (MIRAPEX) 1 MG tablet TAKE 1 TABLET TWICE DAILY 180 tablet 0   No facility-administered medications prior to visit.     Review of Systems:   Constitutional:   No  weight loss, night sweats,  Fevers, chills,  +fatigue, or  lassitude.  HEENT:   No headaches,  Difficulty swallowing,  Tooth/dental problems, or  Sore throat,                No sneezing, itching, ear ache, nasal congestion, post nasal drip,   CV:  No chest pain,  Orthopnea, PND,  anasarca, dizziness, palpitations, syncope.   GI  No heartburn, indigestion, abdominal pain, nausea, vomiting, diarrhea, change in bowel habits, loss of appetite, bloody stools.   Resp:   No chest wall deformity  Skin: no rash or lesions.  GU: no dysuria, change in color of urine, no urgency or frequency.  No flank pain, no hematuria   MS:  No joint pain or swelling.  No decreased range of motion.  No back pain.    Physical Exam  BP 116/60 (BP Location: Left Arm, Cuff Size: Large)   Pulse 88   Temp 98.6 F (37 C) (Temporal)   Ht 5' (1.524 m)   Wt 216 lb 9.6 oz (98.2 kg)   SpO2 91%   BMI 42.30 kg/m   GEN: A/Ox3; pleasant , NAD, BMI 42   HEENT:  [place]/AT,   , NOSE-clear, THROAT-clear, no lesions, no postnasal drip or exudate noted.   NECK:  Supple w/ fair ROM; no JVD; normal carotid impulses w/o bruits; no thyromegaly or nodules palpated; no lymphadenopathy.    RESP  Clear  P & A; w/o, wheezes/ rales/ or rhonchi. no accessory muscle use, no dullness to percussion  CARD:  RRR, no m/r/g, no peripheral edema, pulses intact, no cyanosis or  clubbing.  GI:   Soft & nt; nml bowel sounds; no organomegaly or masses detected.   Musco: Warm bil, no deformities or joint swelling noted.   Neuro: alert, no focal deficits noted.    Skin: Warm, no lesions or rashes    Lab Results:  CBC  BMET  BNP  ProBNP No results found for: PROBNP  Imaging: No results found.    No flowsheet data found.  No results found for: NITRICOXIDE      Assessment & Plan:   No problem-specific Assessment & Plan notes found for this encounter.     Dantre Yearwood  Caid Radin, NP 08/25/2019

## 2019-08-25 NOTE — Patient Instructions (Signed)
Begin BIPAP when available , goal Is to wear for at least 6hr each night  We have called the DME company , if you do not hear from them next week call our office back.  Work on healthy weight  Do not drive if sleepy  Follow up with Dr. Elsworth Soho  In 2 months and As needed

## 2019-08-28 NOTE — Assessment & Plan Note (Signed)
Severe obstructive sleep apnea with chronic hypoxic and hypercarbic respiratory failure Patient requires BiPAP support.  Order has been sent to [facility]  Patient Instructions  Begin BIPAP when available , goal Is to wear for at least 6hr each night  We have called the DME company , if you do not hear from them next week call our office back.  Work on healthy weight  Do not drive if sleepy  Follow up with Dr. Elsworth Soho  In 2 months and As needed

## 2019-08-28 NOTE — Assessment & Plan Note (Signed)
Continue on oxygen at bedtime with BiPAP

## 2019-08-30 DIAGNOSIS — F419 Anxiety disorder, unspecified: Secondary | ICD-10-CM | POA: Diagnosis not present

## 2019-08-30 DIAGNOSIS — J9621 Acute and chronic respiratory failure with hypoxia: Secondary | ICD-10-CM | POA: Diagnosis not present

## 2019-08-30 DIAGNOSIS — M19011 Primary osteoarthritis, right shoulder: Secondary | ICD-10-CM | POA: Diagnosis not present

## 2019-08-30 DIAGNOSIS — I110 Hypertensive heart disease with heart failure: Secondary | ICD-10-CM | POA: Diagnosis not present

## 2019-08-30 DIAGNOSIS — F330 Major depressive disorder, recurrent, mild: Secondary | ICD-10-CM | POA: Diagnosis not present

## 2019-08-30 DIAGNOSIS — G2581 Restless legs syndrome: Secondary | ICD-10-CM | POA: Diagnosis not present

## 2019-08-30 DIAGNOSIS — J9622 Acute and chronic respiratory failure with hypercapnia: Secondary | ICD-10-CM | POA: Diagnosis not present

## 2019-08-30 DIAGNOSIS — E1140 Type 2 diabetes mellitus with diabetic neuropathy, unspecified: Secondary | ICD-10-CM | POA: Diagnosis not present

## 2019-08-30 DIAGNOSIS — I5033 Acute on chronic diastolic (congestive) heart failure: Secondary | ICD-10-CM | POA: Diagnosis not present

## 2019-08-30 NOTE — Progress Notes (Signed)
Cardiology Office Note   Date:  09/04/2019   ID:  [name], DOB 28-Mar-1941, MRN [id number]  PCP:  Brunetta Jeans, PA-C  Cardiologist:  Dr. Percival Spanish  CC: Follow up   History of Present Illness: [name] is a 79 y.o. female who presents for ongoing assessment of dyspnea on O2, diastolic dysfunction with CHF, anxiety, diabetes, HTN. She was reinforced  In low sodium diet, use of diuretic.   She was admitted in 07/11/2019 for decompensated CHF in the setting of low O2 saturations. She was also treated for E-Coli UTI.   She was last seen in the office on 07/24/2019. She is extremely hard of hearing. She was O2 Dependent. She continued to have dependent edema. No changes in her medications were made as she did not appear to volume overloaded. She was hypertensive at that visit. May need to adjust medications on this visit if remains elevated.  BP is much better, but she is very fatigued. She states that her energy is lower than before. She uses a walker for ambulation and is very sedentary otherwise.     Past Medical History:  Diagnosis Date  . AK (actinic keratosis)   . Anxiety    occasional  . Breast cancer ([place])    R breast, s/p radiation 34Gy/29fx 04/29/10-05/05/10  . CTS (carpal tunnel syndrome)    Left  . Diabetes ([place])   . Diastolic CHF ([place])    Dx w/ hospitalization 06/2015 for respiratory failure  . Facial asymmetry, acquired 08/20/2016   From injury  . Falls frequently    "can not pick left leg up"  . Hearing loss    bilateral hearing aides  . History of environmental allergies   . History of kidney stones 20 years ago  . Hypertension   . MDD (major depressive disorder)   . No blood products ([place])  09/11/2013  . Osteoarthritis    hx shoulder, knee, back, wrist pain; saw Dr. Wynelle Link   . Personal history of radiation therapy   . RESTLESS LEGS SYNDROME 05/05/2007   Qualifier: Diagnosis of  By: Gwenette Greet MD, Armando Reichert   . RLS (restless legs syndrome)   .  Sleep apnea     Past Surgical History:  Procedure Laterality Date  . APPENDECTOMY  age 58  . BREAST LUMPECTOMY Right 2012  . BREAST SURGERY  2012   rt breast lumpectomy  . flex signoidoscopy    . INCISION AND DRAINAGE PERIRECTAL ABSCESS    . JOINT REPLACEMENT Right 2006   knee  . TOTAL KNEE ARTHROPLASTY Left 08/06/2014   Procedure: LEFT TOTAL KNEE ARTHROPLASTY;  Surgeon: Gaynelle Arabian, MD;  Location: [facility];  Service: Orthopedics;  Laterality: Left;  . TUBAL LIGATION  1979     Current Outpatient Medications  Medication Sig Dispense Refill  . acetaminophen (TYLENOL) 325 MG tablet Take 2 tablets (650 mg total) by mouth every 6 (six) hours as needed for mild pain (or Fever >/= 101). 12 tablet 0  . albuterol (PROVENTIL) (2.5 MG/3ML) 0.083% nebulizer solution Take 3 mLs (2.5 mg total) by nebulization every 6 (six) hours as needed for wheezing or shortness of breath. 75 mL 12  . amLODipine (NORVASC) 5 MG tablet Take 1.5 tablets (7.5 mg total) by mouth daily. 135 tablet 2  . carvedilol (COREG) 3.125 MG tablet Take 1 tablet (3.125 mg total) by mouth 2 (two) times daily. 60 tablet 2  . furosemide (LASIX) 40 MG tablet TAKE 1 TABLET (40 MG  TOTAL) BY MOUTH DAILY. 90 tablet 1  . gabapentin (NEURONTIN) 100 MG capsule Take 1 capsule by mouth twice daily 60 capsule 6  . Insulin Glargine (LANTUS SOLOSTAR) 100 UNIT/ML Solostar Pen Inject 25 Units into the skin at bedtime. 15 mL 1  . lisinopril (ZESTRIL) 20 MG tablet TAKE 1 TABLET (20 MG TOTAL) BY MOUTH DAILY. 90 tablet 2  . metFORMIN (GLUCOPHAGE) 1000 MG tablet Take 1 tablet (1,000 mg total) by mouth 2 (two) times daily. 180 tablet 2  . methocarbamol (ROBAXIN) 500 MG tablet Take 500 mg by mouth every 6 (six) hours as needed for muscle spasms. Reported on 09/25/2015    . potassium chloride (KLOR-CON) 10 MEQ tablet TAKE 1 TABLET (10 MEQ TOTAL) BY MOUTH DAILY. 90 tablet 3  . pramipexole (MIRAPEX) 1 MG tablet TAKE 1 TABLET TWICE DAILY 180 tablet 0   No  current facility-administered medications for this visit.    Allergies:   Sulfa antibiotics, Sulfonamide derivatives, and Other    Social History:  The patient  reports that she has never smoked. She has never used smokeless tobacco. She reports that she does not drink alcohol or use drugs.   Family History:  The patient's family history includes Alcohol abuse in her father; Cancer in her sister; Diabetes in an other family member; Heart disease in her brother and paternal grandfather; Obesity in an other family member.    ROS: All other systems are reviewed and negative. Unless otherwise mentioned in H&P    PHYSICAL EXAM: VS:  BP 126/73   Pulse 80   Ht 5' (1.524 m)   Wt 217 lb 9.6 oz (98.7 kg)   SpO2 91%   BMI 42.50 kg/m  , BMI Body mass index is 42.5 kg/m. GEN: Well nourished, well developed, in no acute distress HEENT: normal Neck: no JVD, carotid bruits, or masses Cardiac: RRR; no murmurs, rubs, or gallops,2+ dependent  edema  Respiratory:  Clear to auscultation bilaterally, normal work of breathing, some crackles at the bases. Not wearing O2. GI: soft, nontender, nondistended, + BS MS: no deformity or atrophy Skin: warm and dry, no rash Neuro:  Strength and sensation are diminished. Psych: euthymic mood, full affect   EKG: Not completed this office visit.   Recent Labs: 12/27/2018: ALT 21 07/07/2019: B Natriuretic Peptide 53.0 07/08/2019: TSH 0.902 07/18/2019: BUN 25; Creatinine, Ser 0.68; Hemoglobin 12.9; Platelets 288.0; Potassium 3.6; Sodium 139    Lipid Panel    Component Value Date/Time   CHOL 136 12/27/2018 1037   TRIG 113.0 12/27/2018 1037   HDL 34.30 (L) 12/27/2018 1037   CHOLHDL 4 12/27/2018 1037   VLDL 22.6 12/27/2018 1037   LDLCALC 79 12/27/2018 1037   LDLDIRECT 111.6 02/14/2010 1406      Wt Readings from Last 3 Encounters:  09/04/19 217 lb 9.6 oz (98.7 kg)  08/25/19 216 lb 9.6 oz (98.2 kg)  07/25/19 217 lb 9.6 oz (98.7 kg)      Other studies  Reviewed: Echocardiogram 07/08/2019  1. Left ventricular ejection fraction, by estimation, is 60 to 65%. The  left ventricle has normal function. The left ventricle has no regional  wall motion abnormalities. There is mild to moderate left ventricular  hypertrophy. Left ventricular diastolic  parameters are consistent with Grade I diastolic dysfunction (impaired  relaxation).  2. Right ventricular systolic function is normal. The right ventricular  size is normal. Tricuspid regurgitation signal is inadequate for assessing  PA pressure.  3. The mitral valve is grossly  normal, there is mild to moderate annular  calcification. Trivial mitral valve regurgitation.  4. The aortic valve was not well visualized. Mild aortic leaflet  calcification. Aortic valve regurgitation is mild.  5. IVC dilated, but not able to estimate CVP (no sniff).    ASSESSMENT AND PLAN:  1.Hypertension: Significant fatigue with increased dose of amlodipine to 10 mg. BP is low normal. I am going to back off a 1/4 step and decrease amlodipine to 7.5 mg from 10 mg. This may help the dependent edema a little and maybe help to her fell less tired. She is very deconditioned which may be contributing to her symptoms.   2. Chronic diastolic CHF: Weight is stable. She will continue on lasix 40 mg daily. She is to avoid salt as much as possible. Significant dependent edema is noted. I have suggested compression hose. She is not interested as they are too hard to put on.   3. O2 Dependent dyspnea; She is not consistently wearing her O2.  Does wear it at night, and sleeps in a chair as this is easier for her to breath. She should follow up with pulmonary for management.    Current medicines are reviewed at length with the patient today.  I have spent 25 minutes dedicated to the care of this patient on the date of this encounter to include pre-visit review of records, assessment, management and diagnostic testing,with shared  decision making.  Labs/ tests ordered today include: none Phill Myron. West Pugh, ANP, AACC   09/04/2019 4:11 PM    [facility] [address] Office [phone] Fax [phone]  Notice: This dictation was prepared with Dragon dictation along with smaller phrase technology. Any transcriptional errors that result from this process are unintentional and may not be corrected upon review.

## 2019-08-31 DIAGNOSIS — I5032 Chronic diastolic (congestive) heart failure: Secondary | ICD-10-CM | POA: Diagnosis not present

## 2019-08-31 DIAGNOSIS — R062 Wheezing: Secondary | ICD-10-CM | POA: Diagnosis not present

## 2019-08-31 DIAGNOSIS — G4733 Obstructive sleep apnea (adult) (pediatric): Secondary | ICD-10-CM | POA: Diagnosis not present

## 2019-09-01 ENCOUNTER — Telehealth: Payer: Self-pay | Admitting: Adult Health

## 2019-09-01 ENCOUNTER — Telehealth: Payer: Self-pay | Admitting: Physician Assistant

## 2019-09-01 ENCOUNTER — Telehealth: Payer: Self-pay | Admitting: Pulmonary Disease

## 2019-09-01 DIAGNOSIS — J9611 Chronic respiratory failure with hypoxia: Secondary | ICD-10-CM

## 2019-09-01 NOTE — Telephone Encounter (Signed)
When pt came in for her last OV, pt stated that she had not received her BiPAP. The order was placed on 07/25/2019. Called [facility] and spoke with Corene Cornea and was advised that they did not receive the order (even though there is confirmation on the order) and it would need to be refaxed. This was refaxed at the pt's last OV.  Called [facility] back again today and spoke with Manuela Schwartz. She states that [name] is going to work on this. Called pt and made her aware of this. Nothing further needed.

## 2019-09-01 NOTE — Telephone Encounter (Signed)
I called [name] from [place] and waited on hold for over 10 mins. Will try to call her back top let her know this information. I will also send a community message. I sent her a community message as well.

## 2019-09-01 NOTE — Telephone Encounter (Signed)
Received the follow message from [facility] in regards to the pt's BiPAP order: [name], [place]  Phone Number: [phone]  Raymond Gurney - I pulled the order for the Bipap for Ms [name] - Chronic Respiratory Failure is not a covered DX for a Bipap - and I didn't see COPD on the pts dX lisiting - the hospital discharge says the pt needs to have another sleep study to qualify for a replacement Cpap unit - but I don't see that in her chart - we could provide it but it would need to be private pay unless the pt has a sleep study with titration that shows she cannot tolerate Cpap -    Thank you - Kim  ------------------------------------------------------ Dr. Elsworth Soho - please advise. Thanks.

## 2019-09-01 NOTE — Progress Notes (Signed)
  Chronic Care Management   Note  09/01/2019 Name: [name] MRN: [id number] DOB: [birth year]  [name] is a 79 y.o. year old female who is a primary care patient of Delorse Limber. I reached out to [name] by phone today in response to a referral sent by [name]'s PCP, Brunetta Jeans, PA-C.   [name] was given information about Chronic Care Management services today including:  1. CCM service includes personalized support from designated clinical staff supervised by her physician, including individualized plan of care and coordination with other care providers 2. 24/7 contact phone numbers for assistance for urgent and routine care needs. 3. Service will only be billed when office clinical staff spend 20 minutes or more in a month to coordinate care. 4. Only one practitioner may furnish and bill the service in a calendar month. 5. The patient may stop CCM services at any time (effective at the end of the month) by phone call to the office staff.   Patient agreed to services and verbal consent obtained.   Follow up plan:   Earney [place] [employer] Scheduler

## 2019-09-01 NOTE — Telephone Encounter (Signed)
She has chronic respiratory failure with ABGs showing hypercarbia with 60-70 range 03/13/2016 NPSG >>severe sleep apnea with AHI of 61/Hr .  This should qualify her for BiPAP, please see my documentation on office visit

## 2019-09-04 ENCOUNTER — Ambulatory Visit: Payer: Medicare HMO | Admitting: Adult Health

## 2019-09-04 ENCOUNTER — Other Ambulatory Visit: Payer: Self-pay

## 2019-09-04 ENCOUNTER — Encounter: Payer: Self-pay | Admitting: Adult Health

## 2019-09-04 VITALS — BP 126/73 | HR 80 | Ht 60.0 in | Wt 217.6 lb

## 2019-09-04 DIAGNOSIS — J9612 Chronic respiratory failure with hypercapnia: Secondary | ICD-10-CM

## 2019-09-04 DIAGNOSIS — I10 Essential (primary) hypertension: Secondary | ICD-10-CM

## 2019-09-04 DIAGNOSIS — R609 Edema, unspecified: Secondary | ICD-10-CM | POA: Diagnosis not present

## 2019-09-04 DIAGNOSIS — J9611 Chronic respiratory failure with hypoxia: Secondary | ICD-10-CM

## 2019-09-04 MED ORDER — AMLODIPINE BESYLATE 5 MG PO TABS: 7.5000 mg | ORAL_TABLET | Freq: Every day | ORAL | 2 refills | Status: DC

## 2019-09-04 NOTE — Patient Instructions (Signed)
Medication Instructions:  DECREASE- Amlodipine 7.5 mg(1 1/2 tablet) by mouth daily  *If you need a refill on your cardiac medications before your next appointment, please call your pharmacy*   Lab Work: None Ordered   Testing/Procedures: None Ordered   Follow-Up: At [facility], you and your health needs are our priority.  As part of our continuing mission to provide you with exceptional heart care, we have created designated Provider Care Teams.  These Care Teams include your primary Cardiologist (physician) and Advanced Practice Providers (APPs -  Physician Assistants and Nurse Practitioners) who all work together to provide you with the care you need, when you need it.  We recommend signing up for the patient portal called "MyChart".  Sign up information is provided on this After Visit Summary.  MyChart is used to connect with patients for Virtual Visits (Telemedicine).  Patients are able to view lab/test results, encounter notes, upcoming appointments, etc.  Non-urgent messages can be sent to your provider as well.   To learn more about what you can do with MyChart, go to [email or web address].    Your next appointment:   3 month(s)  The format for your next appointment:   In Person  Provider:   You may see Minus Breeding, MD or one of the following Advanced Practice Providers on your designated Care Team:    Rosaria Ferries, PA-C  Jory Sims, DNP, ANP  Cadence Kathlen Mody, NP

## 2019-09-05 NOTE — Telephone Encounter (Signed)
[name], did they ever send you a community msg back?

## 2019-09-05 NOTE — Telephone Encounter (Addendum)
Spoke with [facility], Joelene Millin wasn't available to talk to me at the time and I left a message for her to call back or to check her message about this matter.

## 2019-09-06 ENCOUNTER — Ambulatory Visit (INDEPENDENT_AMBULATORY_CARE_PROVIDER_SITE_OTHER): Payer: Medicare HMO

## 2019-09-06 DIAGNOSIS — M19011 Primary osteoarthritis, right shoulder: Secondary | ICD-10-CM | POA: Diagnosis not present

## 2019-09-06 DIAGNOSIS — J9622 Acute and chronic respiratory failure with hypercapnia: Secondary | ICD-10-CM | POA: Diagnosis not present

## 2019-09-06 DIAGNOSIS — E1140 Type 2 diabetes mellitus with diabetic neuropathy, unspecified: Secondary | ICD-10-CM | POA: Diagnosis not present

## 2019-09-06 DIAGNOSIS — F419 Anxiety disorder, unspecified: Secondary | ICD-10-CM | POA: Diagnosis not present

## 2019-09-06 DIAGNOSIS — J9621 Acute and chronic respiratory failure with hypoxia: Secondary | ICD-10-CM | POA: Diagnosis not present

## 2019-09-06 DIAGNOSIS — F330 Major depressive disorder, recurrent, mild: Secondary | ICD-10-CM | POA: Diagnosis not present

## 2019-09-06 DIAGNOSIS — I5033 Acute on chronic diastolic (congestive) heart failure: Secondary | ICD-10-CM | POA: Diagnosis not present

## 2019-09-06 DIAGNOSIS — Z0000 Encounter for general adult medical examination without abnormal findings: Secondary | ICD-10-CM

## 2019-09-06 DIAGNOSIS — I110 Hypertensive heart disease with heart failure: Secondary | ICD-10-CM | POA: Diagnosis not present

## 2019-09-06 DIAGNOSIS — G2581 Restless legs syndrome: Secondary | ICD-10-CM | POA: Diagnosis not present

## 2019-09-06 NOTE — Patient Instructions (Signed)
[name] , Thank you for taking time to come for your Medicare Wellness Visit. I appreciate your ongoing commitment to your health goals. Please review the following plan we discussed and let me know if I can assist you in the future.   Screening recommendations/referrals: Colorectal Screening: No longer indicated Mammogram: up to date; last 09/12/18 Bone Density: recommended   Vision and Dental Exams: Recommended annual ophthalmology exams for early detection of glaucoma and other disorders of the eye Recommended annual dental exams for proper oral hygiene  Diabetic Exams: Diabetic Eye Exam: recommended yearly; up to date Diabetic Foot Exam: recommended yearly; up to date   Vaccinations: Influenza vaccine: completed 01/24/19 Pneumococcal vaccine: up to date; last 01/30/15 Tdap vaccine: up to date; last 10/21/15  Shingles vaccine: You may receive this vaccine at your local pharmacy. (see handout)   Advanced directives: Please bring a copy of your POA (Power of Attorney) and/or Living Will to your next appointment.  Goals: Recommend to drink at least 6-8 8oz glasses of water per day and consume a balanced diet rich in fresh fruits and vegetables.   Continue to be careful of falls   Next appointment: Please schedule your Annual Wellness Visit with your Nurse Health Advisor in one year.  Preventive Care 81 Years and Older, Female Preventive care refers to lifestyle choices and visits with your health care provider that can promote health and wellness. What does preventive care include?  A yearly physical exam. This is also called an annual well check.  Dental exams once or twice a year.  Routine eye exams. Ask your health care provider how often you should have your eyes checked.  Personal lifestyle choices, including:  Daily care of your teeth and gums.  Regular physical activity.  Eating a healthy diet.  Avoiding tobacco and drug use.  Limiting alcohol use.  Practicing  safe sex.  Taking low-dose aspirin every day if recommended by your health care provider.  Taking vitamin and mineral supplements as recommended by your health care provider. What happens during an annual well check? The services and screenings done by your health care provider during your annual well check will depend on your age, overall health, lifestyle risk factors, and family history of disease. Counseling  Your health care provider may ask you questions about your:  Alcohol use.  Tobacco use.  Drug use.  Emotional well-being.  Home and relationship well-being.  Sexual activity.  Eating habits.  History of falls.  Memory and ability to understand (cognition).  Work and work Statistician.  Reproductive health. Screening  You may have the following tests or measurements:  Height, weight, and BMI.  Blood pressure.  Lipid and cholesterol levels. These may be checked every 5 years, or more frequently if you are over 18 years old.  Skin check.  Lung cancer screening. You may have this screening every year starting at age 81 if you have a 30-pack-year history of smoking and currently smoke or have quit within the past 15 years.  Fecal occult blood test (FOBT) of the stool. You may have this test every year starting at age 40.  Flexible sigmoidoscopy or colonoscopy. You may have a sigmoidoscopy every 5 years or a colonoscopy every 10 years starting at age 20.  Hepatitis C blood test.  Hepatitis B blood test.  Sexually transmitted disease (STD) testing.  Diabetes screening. This is done by checking your blood sugar (glucose) after you have not eaten for a while (fasting). You may have  this done every 1-3 years.  Bone density scan. This is done to screen for osteoporosis. You may have this done starting at age 55.  Mammogram. This may be done every 1-2 years. Talk to your health care provider about how often you should have regular mammograms. Talk with your  health care provider about your test results, treatment options, and if necessary, the need for more tests. Vaccines  Your health care provider may recommend certain vaccines, such as:  Influenza vaccine. This is recommended every year.  Tetanus, diphtheria, and acellular pertussis (Tdap, Td) vaccine. You may need a Td booster every 10 years.  Zoster vaccine. You may need this after age 20.  Pneumococcal 13-valent conjugate (PCV13) vaccine. One dose is recommended after age 81.  Pneumococcal polysaccharide (PPSV23) vaccine. One dose is recommended after age 75. Talk to your health care provider about which screenings and vaccines you need and how often you need them. This information is not intended to replace advice given to you by your health care provider. Make sure you discuss any questions you have with your health care provider. Document Released: 05/31/2015 Document Revised: 01/22/2016 Document Reviewed: 03/05/2015 Elsevier Interactive Patient Education  2017 [place] Prevention in the Home Falls can cause injuries. They can happen to people of all ages. There are many things you can do to make your home safe and to help prevent falls. What can I do on the outside of my home?  Regularly fix the edges of walkways and driveways and fix any cracks.  Remove anything that might make you trip as you walk through a door, such as a raised step or threshold.  Trim any bushes or trees on the path to your home.  Use bright outdoor lighting.  Clear any walking paths of anything that might make someone trip, such as rocks or tools.  Regularly check to see if handrails are loose or broken. Make sure that both sides of any steps have handrails.  Any raised decks and porches should have guardrails on the edges.  Have any leaves, snow, or ice cleared regularly.  Use sand or salt on walking paths during winter.  Clean up any spills in your garage right away. This includes oil  or grease spills. What can I do in the bathroom?  Use night lights.  Install grab bars by the toilet and in the tub and shower. Do not use towel bars as grab bars.  Use non-skid mats or decals in the tub or shower.  If you need to sit down in the shower, use a plastic, non-slip stool.  Keep the floor dry. Clean up any water that spills on the floor as soon as it happens.  Remove soap buildup in the tub or shower regularly.  Attach bath mats securely with double-sided non-slip rug tape.  Do not have throw rugs and other things on the floor that can make you trip. What can I do in the bedroom?  Use night lights.  Make sure that you have a light by your bed that is easy to reach.  Do not use any sheets or blankets that are too big for your bed. They should not hang down onto the floor.  Have a firm chair that has side arms. You can use this for support while you get dressed.  Do not have throw rugs and other things on the floor that can make you trip. What can I do in the kitchen?  Clean up any  spills right away.  Avoid walking on wet floors.  Keep items that you use a lot in easy-to-reach places.  If you need to reach something above you, use a strong step stool that has a grab bar.  Keep electrical cords out of the way.  Do not use floor polish or wax that makes floors slippery. If you must use wax, use non-skid floor wax.  Do not have throw rugs and other things on the floor that can make you trip. What can I do with my stairs?  Do not leave any items on the stairs.  Make sure that there are handrails on both sides of the stairs and use them. Fix handrails that are broken or loose. Make sure that handrails are as long as the stairways.  Check any carpeting to make sure that it is firmly attached to the stairs. Fix any carpet that is loose or worn.  Avoid having throw rugs at the top or bottom of the stairs. If you do have throw rugs, attach them to the floor with  carpet tape.  Make sure that you have a light switch at the top of the stairs and the bottom of the stairs. If you do not have them, ask someone to add them for you. What else can I do to help prevent falls?  Wear shoes that:  Do not have high heels.  Have rubber bottoms.  Are comfortable and fit you well.  Are closed at the toe. Do not wear sandals.  If you use a stepladder:  Make sure that it is fully opened. Do not climb a closed stepladder.  Make sure that both sides of the stepladder are locked into place.  Ask someone to hold it for you, if possible.  Clearly mark and make sure that you can see:  Any grab bars or handrails.  First and last steps.  Where the edge of each step is.  Use tools that help you move around (mobility aids) if they are needed. These include:  Canes.  Walkers.  Scooters.  Crutches.  Turn on the lights when you go into a dark area. Replace any light bulbs as soon as they burn out.  Set up your furniture so you have a clear path. Avoid moving your furniture around.  If any of your floors are uneven, fix them.  If there are any pets around you, be aware of where they are.  Review your medicines with your doctor. Some medicines can make you feel dizzy. This can increase your chance of falling. Ask your doctor what other things that you can do to help prevent falls. This information is not intended to replace advice given to you by your health care provider. Make sure you discuss any questions you have with your health care provider. Document Released: 02/28/2009 Document Revised: 10/10/2015 Document Reviewed: 06/08/2014 Elsevier Interactive Patient Education  2017 [employer].

## 2019-09-06 NOTE — Progress Notes (Signed)
This visit is being conducted via phone call due to the COVID-19 pandemic. This patient has given me verbal consent via phone to conduct this visit, patient states they are participating from their home address. Some vital signs may be absent or patient reported.   Patient identification: identified by name, DOB, and current address.  Location provider: [facility], Office Persons participating in the virtual visit: Denman George LPN, patient, and Brunetta Jeans PA    Subjective:   [name] is a 79 y.o. female who presents for Medicare Annual (Subsequent) preventive examination.  Review of Systems:   Cardiac Risk Factors include: advanced age (>29men, >64 women);dyslipidemia;hypertension;sedentary lifestyle    Objective:     Vitals: There were no vitals taken for this visit.  There is no height or weight on file to calculate BMI.  Advanced Directives 09/06/2019 07/07/2019 07/07/2019 07/05/2019 05/31/2017 10/13/2016 08/01/2016  Does Patient Have a Medical Advance Directive? Yes No No Yes Yes No No  Type of Advance Directive Living will;Healthcare Power of Attorney Living will;Healthcare Power of [place];Living will Kay;Living will - -  Does patient want to make changes to medical advance directive? No - Patient declined - - - - - -  Copy of [place] in Chart? No - copy requested No - copy requested - No - copy requested - - -  Would patient like information on creating a medical advance directive? - No - Patient declined No - Patient declined - - - No - Patient declined    Tobacco Social History   Tobacco Use  Smoking Status Never Smoker  Smokeless Tobacco Never Used     Counseling given: Not Answered   Clinical Intake:  Pre-visit preparation completed: Yes  Pain : No/denies pain  Diabetes: Yes CBG done?: No Did pt. bring in CBG monitor from home?: No  How often do you need to have someone  help you when you read instructions, pamphlets, or other written materials from your doctor or pharmacy?: 2 - Rarely  Interpreter Needed?: No  Information entered by :: Denman George LPN  Past Medical History:  Diagnosis Date  . AK (actinic keratosis)   . Anxiety    occasional  . Breast cancer ([place])    R breast, s/p radiation 34Gy/58fx 04/29/10-05/05/10  . CTS (carpal tunnel syndrome)    Left  . Diabetes ([place])   . 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 Facial asymmetry, acquired 08/20/2016   From injury  . Falls frequently    "can not pick left leg up"  . Hearing loss    bilateral hearing aides  . History of environmental allergies   . History of kidney stones 20 years ago  . Hypertension   . MDD (major depressive disorder)   . No blood products ([place])  09/11/2013  . Osteoarthritis    hx shoulder, knee, back, wrist pain; saw Dr. Wynelle Link   . Personal history of radiation therapy   . RESTLESS LEGS SYNDROME 05/05/2007   Qualifier: Diagnosis of  By: Gwenette Greet MD, Armando Reichert   . RLS (restless legs syndrome)   . Sleep apnea    Past Surgical History:  Procedure Laterality Date  . APPENDECTOMY  age 66  . BREAST LUMPECTOMY Right 2012  . BREAST SURGERY  2012   rt breast lumpectomy  . flex signoidoscopy    . INCISION AND DRAINAGE PERIRECTAL ABSCESS    .  JOINT REPLACEMENT Right 2006   knee  . TOTAL KNEE ARTHROPLASTY Left 08/06/2014   Procedure: LEFT TOTAL KNEE ARTHROPLASTY;  Surgeon: Gaynelle Arabian, MD;  Location: [facility];  Service: Orthopedics;  Laterality: Left;  . TUBAL LIGATION  1979   Family History  Problem Relation Age of Onset  . Alcohol abuse Father   . Cancer Sister        breast  . Heart disease Paternal Grandfather   . Diabetes Other   . Obesity Other   . Heart disease Brother        Valve   Social History   Socioeconomic History  . Marital status: Married    Spouse name: Not on file  . Number of children: 3    . Years of education: Not on file  . Highest education level: Not on file  Occupational History  . Not on file  Tobacco Use  . Smoking status: Never Smoker  . Smokeless tobacco: Never Used  Substance and Sexual Activity  . Alcohol use: No  . Drug use: No  . Sexual activity: Yes  Other Topics Concern  . Not on file  Social History Narrative         Home Situation: lives with husband and granddaughter      Spiritual Beliefs: Jehovah Witness - no blood products               Social Determinants of Health   Financial Resource Strain:   . Difficulty of Paying Living Expenses:   Food Insecurity:   . Worried About Charity fundraiser in the Last Year:   . Arboriculturist in the Last Year:   Transportation Needs:   . Film/video editor (Medical):   Marland Kitchen Lack of Transportation (Non-Medical):   Physical Activity:   . Days of Exercise per Week:   . Minutes of Exercise per Session:   Stress:   . Feeling of Stress :   Social Connections:   . Frequency of Communication with Friends and Family:   . Frequency of Social Gatherings with Friends and Family:   . Attends Religious Services:   . Active Member of Clubs or Organizations:   . Attends Archivist Meetings:   Marland Kitchen Marital Status:     Outpatient Encounter Medications as of 09/06/2019  Medication Sig  . acetaminophen (TYLENOL) 325 MG tablet Take 2 tablets (650 mg total) by mouth every 6 (six) hours as needed for mild pain (or Fever >/= 101).  Marland Kitchen albuterol (PROVENTIL) (2.5 MG/3ML) 0.083% nebulizer solution Take 3 mLs (2.5 mg total) by nebulization every 6 (six) hours as needed for wheezing or shortness of breath.  Marland Kitchen amLODipine (NORVASC) 5 MG tablet Take 1.5 tablets (7.5 mg total) by mouth daily.  . carvedilol (COREG) 3.125 MG tablet Take 1 tablet (3.125 mg total) by mouth 2 (two) times daily.  . furosemide (LASIX) 40 MG tablet TAKE 1 TABLET (40 MG TOTAL) BY MOUTH DAILY.  Marland Kitchen gabapentin (NEURONTIN) 100 MG capsule Take 1  capsule by mouth twice daily  . Insulin Glargine (LANTUS SOLOSTAR) 100 UNIT/ML Solostar Pen Inject 25 Units into the skin at bedtime.  Marland Kitchen lisinopril (ZESTRIL) 20 MG tablet TAKE 1 TABLET (20 MG TOTAL) BY MOUTH DAILY.  . metFORMIN (GLUCOPHAGE) 1000 MG tablet Take 1 tablet (1,000 mg total) by mouth 2 (two) times daily.  . methocarbamol (ROBAXIN) 500 MG tablet Take 500 mg by mouth every 6 (six) hours as needed for muscle spasms. Reported  on 09/25/2015  . potassium chloride (KLOR-CON) 10 MEQ tablet TAKE 1 TABLET (10 MEQ TOTAL) BY MOUTH DAILY.  Marland Kitchen pramipexole (MIRAPEX) 1 MG tablet TAKE 1 TABLET TWICE DAILY   No facility-administered encounter medications on file as of 09/06/2019.    Activities of Daily Living In your present state of health, do you have any difficulty performing the following activities: 09/06/2019 07/08/2019  Hearing? [place]? N -  Difficulty concentrating or making decisions? N -  Walking or climbing stairs? Y -  Comment - -  Dressing or bathing? N -  Doing errands, shopping? Y N  Preparing Food and eating ? N -  Using the Toilet? N -  In the past six months, have you accidently leaked urine? N -  Do you have problems with loss of bowel control? N -  Managing your Medications? N -  Managing your Finances? N -  Housekeeping or managing your Housekeeping? N -  Some recent data might be hidden    Patient Care Team: Delorse Limber as PCP - General (Family Medicine) Minus Breeding, MD as PCP - Cardiology (Cardiology) Suella Broad, MD as Consulting Physician (Physical Medicine and Rehabilitation) Minus Breeding, MD as Consulting Physician (Cardiology) Madelin Headings, DO (Optometry) Madelin Rear, [facility] as Pharmacist (Pharmacist)    Assessment:   This is a routine wellness examination for [place].  Exercise Activities and Dietary recommendations Current Exercise Habits: The patient does not participate in regular exercise at present  Goals    . Stay well and out  of the hospital. (pt-stated)       Fall Risk Fall Risk  09/06/2019 10/18/2018 09/22/2016 07/08/2016 09/25/2015  Falls in the past year? 1 1 Yes Yes Yes  Number falls in past yr: 1 0 1 - 1  Injury with Fall? 1 0 - - -  Risk for fall due to : Impaired balance/gait;Impaired mobility;History of fall(s) - - - History of fall(s);Impaired balance/gait  Follow up Falls evaluation completed;Education provided;Falls prevention discussed Falls evaluation completed Education provided - Falls evaluation completed;Education provided;Falls prevention discussed   Is the patient's home free of loose throw rugs in walkways, pet beds, electrical cords, etc?   yes      Grab bars in the bathroom? yes      Handrails on the stairs?   yes      Adequate lighting?   yes   Depression Screen PHQ 2/9 Scores 09/06/2019 04/10/2019 10/18/2018 04/26/2018  PHQ - 2 Score 0 1 0 1  PHQ- 9 Score - 2 2 7      Cognitive Function-no cognitive concerns at this time      6CIT Screen 09/06/2019  What Year? 0 points  What month? 0 points  What time? 0 points  Count back from 20 0 points  Months in reverse 0 points    Immunization History  Administered Date(s) Administered  . Fluad Quad(high Dose 65+) 01/24/2019  . Influenza Split 02/20/2011, 01/25/2012  . Influenza Whole 02/16/1999, 02/25/2007, 03/07/2009, 02/14/2010  . Influenza, High Dose Seasonal PF 02/17/2016, 03/23/2017, 01/20/2018  . Influenza,inj,Quad PF,6+ Mos 02/01/2013, 02/19/2014, 01/30/2015  . Influenza,inj,quad, With Preservative 02/15/2017  . PFIZER SARS-COV-2 Vaccination 06/06/2019, 06/26/2019  . Pneumococcal Conjugate-13 01/30/2015  . Pneumococcal Polysaccharide-23 10/11/2013  . Td 05/18/2005  . Tdap 10/21/2015  . Zoster 02/20/2011    Qualifies for Shingles Vaccine?Discussed and patient will check with pharmacy for coverage.  Patient education handout provided   Screening Tests Health Maintenance  Topic Date Due  .  DTAP VACCINES (1) 05/24/1941  .  INFLUENZA VACCINE  12/17/2019  . OPHTHALMOLOGY EXAM  12/20/2019  . HEMOGLOBIN A1C  01/02/2020  . FOOT EXAM  01/24/2020  . DTaP/Tdap/Td (3 - Td) 10/20/2025  . TETANUS/TDAP  10/20/2025  . DEXA SCAN  Completed  . COVID-19 Vaccine  Completed  . PNA vac Low Risk Adult  Completed    Cancer Screenings: Lung: Low Dose CT Chest recommended if Age 64-80 years, 30 pack-year currently smoking OR have quit w/in 15years. Patient does not qualify. Breast:  Up to date on Mammogram? Yes   Up to date of Bone Density/Dexa? Yes Colorectal: No longer indicated      Plan:  I have personally reviewed and addressed the Medicare Annual Wellness questionnaire and have noted the following in the patient's chart:  A. Medical and social history B. Use of alcohol, tobacco or illicit drugs  C. Current medications and supplements D. Functional ability and status E.  Nutritional status F.  Physical activity G. Advance directives H. List of other physicians I.  Hospitalizations, surgeries, and ER visits in previous 12 months J.  [place] such as hearing and vision if needed, cognitive and depression L. Referrals, records requested, and appointments- none   In addition, I have reviewed and discussed with patient certain preventive protocols, quality metrics, and best practice recommendations. A written personalized care plan for preventive services as well as general preventive health recommendations were provided to patient.   Signed,  Denman George, LPN  Nurse Health Advisor   Nurse Notes: Will send message to pulmonology on patients behalf to check into status of Bipap order

## 2019-09-08 ENCOUNTER — Other Ambulatory Visit: Payer: Self-pay | Admitting: General Practice

## 2019-09-08 DIAGNOSIS — Z794 Long term (current) use of insulin: Secondary | ICD-10-CM

## 2019-09-08 DIAGNOSIS — E1140 Type 2 diabetes mellitus with diabetic neuropathy, unspecified: Secondary | ICD-10-CM

## 2019-09-08 DIAGNOSIS — E1159 Type 2 diabetes mellitus with other circulatory complications: Secondary | ICD-10-CM

## 2019-09-08 DIAGNOSIS — I10 Essential (primary) hypertension: Secondary | ICD-10-CM

## 2019-09-08 DIAGNOSIS — I152 Hypertension secondary to endocrine disorders: Secondary | ICD-10-CM

## 2019-09-14 NOTE — Telephone Encounter (Signed)
Spoke with Joelene Millin, she explained that the patient has to have one of the diagnosis below to qualify for Bipap. I advised her that CPAP has been tried and failed. I tried to use the obesity hypoventilation code but they have to have blood arterial gas labs and spirometry. She does have Blood arterial gas but no spirometry on file. She is going to send over a guide for [place] to follow for future references. RA please advise. 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

## 2019-09-15 NOTE — Telephone Encounter (Signed)
Please send Rx for trilogy / NIV - auto AVAPs settings Dx chronic hypercarbic resp failure

## 2019-09-21 NOTE — Telephone Encounter (Signed)
Order sent to DME per Dr Bari Mantis request.

## 2019-09-27 ENCOUNTER — Telehealth: Payer: Self-pay | Admitting: Pulmonary Disease

## 2019-09-27 DIAGNOSIS — J9611 Chronic respiratory failure with hypoxia: Secondary | ICD-10-CM

## 2019-09-27 DIAGNOSIS — E662 Morbid (severe) obesity with alveolar hypoventilation: Secondary | ICD-10-CM

## 2019-09-27 NOTE — Telephone Encounter (Signed)
[facility] response to DME order for trilogy:  [name] sent to [name]  Phone Number:  [phone] (Call me)  Good afternoon [place],   I have looked at every possible way to be able to process this Trilogy order for [name]. We basically need to know what is causing her Chronic Respiratory Failure. Has COPD been ruled out as a diagnosis? Chronic Respiratory Failure alone will not qualify her for a trilogy even with elevated PCO2... please let me know how Dr. Elsworth Soho would like [place] to proceed and if he will be testing for COPD in the very near future. That is about the only way she would qualify at this point.

## 2019-09-27 NOTE — Telephone Encounter (Signed)
RA, please advise.

## 2019-09-28 NOTE — Telephone Encounter (Signed)
Holly- Do we need to place a new order with the additional dx of OHS? Or can you add to the original order  I have LMTCB for the pt to schedule before and after

## 2019-09-28 NOTE — Telephone Encounter (Signed)
Diagnosis is - chronioc respiratory failure + obesity hypoventilation syndrome Request is for trilogy machine Please obtain spirometry pre-post if possible

## 2019-09-28 NOTE — Telephone Encounter (Signed)
New Order sent to [facility].

## 2019-09-28 NOTE — Telephone Encounter (Signed)
Not sure just put in what ever order is needed to get the trilogy and use all the diagnosis for it Joellen [place]

## 2019-09-28 NOTE — Telephone Encounter (Signed)
New order sent to [facility] for this  Will await call back to schedule for pre and post spiro

## 2019-09-30 DIAGNOSIS — R062 Wheezing: Secondary | ICD-10-CM | POA: Diagnosis not present

## 2019-09-30 DIAGNOSIS — I5032 Chronic diastolic (congestive) heart failure: Secondary | ICD-10-CM | POA: Diagnosis not present

## 2019-09-30 DIAGNOSIS — G4733 Obstructive sleep apnea (adult) (pediatric): Secondary | ICD-10-CM | POA: Diagnosis not present

## 2019-10-05 ENCOUNTER — Ambulatory Visit: Payer: Medicare HMO

## 2019-10-05 DIAGNOSIS — I10 Essential (primary) hypertension: Secondary | ICD-10-CM

## 2019-10-05 DIAGNOSIS — G2581 Restless legs syndrome: Secondary | ICD-10-CM

## 2019-10-05 DIAGNOSIS — E1140 Type 2 diabetes mellitus with diabetic neuropathy, unspecified: Secondary | ICD-10-CM

## 2019-10-05 DIAGNOSIS — J9611 Chronic respiratory failure with hypoxia: Secondary | ICD-10-CM

## 2019-10-05 DIAGNOSIS — I5032 Chronic diastolic (congestive) heart failure: Secondary | ICD-10-CM

## 2019-10-05 DIAGNOSIS — J9612 Chronic respiratory failure with hypercapnia: Secondary | ICD-10-CM

## 2019-10-05 DIAGNOSIS — Z794 Long term (current) use of insulin: Secondary | ICD-10-CM

## 2019-10-05 NOTE — Progress Notes (Signed)
Chronic Care Management Pharmacy  Name: [name]  MRN: [id number] DOB: March 21, 1941  Chief Complaint/ HPI  [name],  79 y.o. , female presents for their Initial CCM visit with the clinical pharmacist via telephone due to COVID-19 Pandemic. Patient is not accompanied by family at this visit. She is very hard of hearing.   PCP : Brunetta Jeans, PA-C  Their chronic conditions include: Encounter Diagnoses  Name Primary?  . Essential hypertension   . RESTLESS LEGS SYNDROME   . Type 2 diabetes mellitus with diabetic neuropathy, with long-term current use of insulin ([place])   . Chronic respiratory failure with hypoxia and hypercapnia (HCC)   . Chronic diastolic congestive heart failure ([place]) Yes    Office Visits:  07/18/2019 (PCP): 14-day Hospitalization follow-up. Acute on chronic respiratory failure. Reported FBG readings at home 110-120. Patient instructions "Please make sure to restrict fluid intake to 60 ounces or less daily. Check weight once daily (same time each day) and write this down. If you notice more than a 3 lb weight gain in 1 day or > 5 lb increase in 3 days, you need to call us or the Cardiologist" May 09, 2020 (PCP): battery dead in glucometer. Low-dose melatonin 3 mg to help with sleep.  Consult Visit: 09/04/2019 Jory Sims, Cardiology): amlodipine cut down to 7.5 mg from 10 mg due to tiredness.  07/24/2019 Jory Sims, Cardiology): BP elevated, considering coreg increase.   Patient Active Problem List   Diagnosis Date Noted  . Chronic respiratory failure with hypoxia and hypercapnia ([place]) 07/11/2019  . Acute on chronic diastolic CHF (congestive heart failure) ([place]) 07/08/2019  . Hypoxia 07/07/2019  . Hypokalemia 05/03/2019  . Facial asymmetry, acquired 08/20/2016  . Fracture of knee prosthesis ([place]) 08/01/2016  . Obesity 02/20/2016  . Chronic pain syndrome 02/19/2016  . Mild episode of recurrent major depressive disorder ([place]) 08/02/2015  .  Chronic diastolic congestive heart failure ([place]) 07/16/2015  . Type 2 diabetes mellitus with diabetic neuropathy ([place]) 09/27/2014  . Hearing loss - has hearing aides, follow by audiologist 10/11/2013  . No blood products ([place])  09/11/2013  . Disorder of bone and cartilage 06/10/2010  . Breast cancer of upper-outer quadrant of right female breast ([place]) 04/04/2010  . RESTLESS LEGS SYNDROME 05/05/2007  . Obstructive sleep apnea 03/01/2007  . Essential hypertension 01/27/2007  . Osteoarthritis 01/27/2007   Social History   Socioeconomic History  . Marital status: Married    Spouse name: Not on file  . Number of children: 3  . Years of education: Not on file  . Highest education level: Not on file  Occupational History  . Not on file  Tobacco Use  . Smoking status: Never Smoker  . Smokeless tobacco: Never Used  Substance and Sexual Activity  . Alcohol use: No  . Drug use: No  . Sexual activity: Yes  Other Topics Concern  . Not on file  Social History Narrative         Home Situation: lives with husband and granddaughter      Spiritual Beliefs: Jehovah Witness - no blood products               Social Determinants of Health   Financial Resource Strain:   . Difficulty of Paying Living Expenses:   Food Insecurity: No Food Insecurity  . Worried About Charity fundraiser in the Last Year: Never true  . Ran Out of Food in the Last Year: Never true  Transportation  Needs: No Transportation Needs  . Lack of Transportation (Medical): No  . Lack of Transportation (Non-Medical): No  Physical Activity:   . Days of Exercise per Week:   . Minutes of Exercise per Session:   Stress:   . Feeling of Stress :   Social Connections:   . Frequency of Communication with Friends and Family:   . Frequency of Social Gatherings with Friends and Family:   . Attends Religious Services:   . Active Member of Clubs or Organizations:   . Attends Archivist Meetings:   Marland Kitchen  Marital Status:    Family History  Problem Relation Age of Onset  . Alcohol abuse Father   . Cancer Sister        breast  . Heart disease Paternal Grandfather   . Diabetes Other   . Obesity Other   . Heart disease Brother        Valve   Allergies  Allergen Reactions  . Sulfa Antibiotics Swelling    Other reaction(s): Facial Swelling "swelling lips"   . Sulfonamide Derivatives Swelling    Lips Swelling  . Other     BLOOD PRODUCT REFUSAL   Outpatient Encounter Medications as of 10/05/2019  Medication Sig  . acetaminophen (TYLENOL) 325 MG tablet Take 2 tablets (650 mg total) by mouth every 6 (six) hours as needed for mild pain (or Fever >/= 101).  Marland Kitchen albuterol (PROVENTIL) (2.5 MG/3ML) 0.083% nebulizer solution Take 3 mLs (2.5 mg total) by nebulization every 6 (six) hours as needed for wheezing or shortness of breath.  Marland Kitchen amLODipine (NORVASC) 5 MG tablet Take 1.5 tablets (7.5 mg total) by mouth daily.  . carvedilol (COREG) 3.125 MG tablet Take 1 tablet (3.125 mg total) by mouth 2 (two) times daily.  . furosemide (LASIX) 40 MG tablet TAKE 1 TABLET (40 MG TOTAL) BY MOUTH DAILY.  Marland Kitchen gabapentin (NEURONTIN) 100 MG capsule Take 1 capsule by mouth twice daily  . Insulin Glargine (LANTUS SOLOSTAR) 100 UNIT/ML Solostar Pen Inject 25 Units into the skin at bedtime.  Marland Kitchen lisinopril (ZESTRIL) 20 MG tablet TAKE 1 TABLET (20 MG TOTAL) BY MOUTH DAILY.  . metFORMIN (GLUCOPHAGE) 1000 MG tablet Take 1 tablet (1,000 mg total) by mouth 2 (two) times daily.  . methocarbamol (ROBAXIN) 500 MG tablet Take 500 mg by mouth every 6 (six) hours as needed for muscle spasms. Reported on 09/25/2015  . potassium chloride (KLOR-CON) 10 MEQ tablet TAKE 1 TABLET (10 MEQ TOTAL) BY MOUTH DAILY.  Marland Kitchen pramipexole (MIRAPEX) 1 MG tablet TAKE 1 TABLET TWICE DAILY   No facility-administered encounter medications on file as of 10/05/2019.   Current Diagnosis/Assessment: Goals Addressed            This Visit's Progress   .  PharmD Care Plan       CARE PLAN ENTRY Current Barriers:  . Chronic Disease Management support, education, and care coordination needs related to Hypertension, Diabetes, Heart Failure, and RLS   Hypertension / Heart Failure . Pharmacist Clinical Goal(s): o Over the next 90 days, patient will work with PharmD and providers to maintain BP goal <130/80 . Current regimen:   Carvedilol 3.125 mg twice daily  Furosemide 40 mg daily   Lisinopril 20 mg daily  Amlodipine 7.5 mg daily  Potassium cl er 10 meq  . Interventions: o BP monitoring plan . Patient self care activities - Over the next 90 days, patient will: o Check BP at least once every 1-2 weeks, document, and provide  at future appointments - Please have son purchase blood pressure monitor with cuff for the upper arm so we can keep an eye on this  o Ensure daily salt intake < 2300 mg/day o Check weight once daily (same time each day) and write this down. If you notice more than a 3 lb weight gain in 1 day or > 5 lb increase in 3 days, you need to our office or the Cardiologist o Restrict fluid to 60 ounces (7.5 cups) or less  Diabetes . Pharmacist Clinical Goal(s): o Over the next 90 days, patient will work with PharmD and providers to maintain A1c goal <8% . Current regimen:   Metformin 1000 mg twice daily   Lantus 25 units once every night  . Interventions: o Please let us know if you are needing a replacement glucometer.  . Patient self care activities - Over the next 90 days, patient will: o Check blood sugar once daily, document, and provide at future appointments o Contact provider with any episodes of hypoglycemia RLS / Pain . Pharmacist Clinical Goal(s) o Over the next 180 days, patient will work with PharmD and providers to minimize RLS / pain symptoms . Current regimen:   Gabapentin 100 mg once daily  Pramipexole 1 mg twice daily   Methocarbamol 500 mg every 6 hours as needed  . Interventions: o Continue  current management . Patient self care activities - Over the next 180 days, patient will: o Continue current management  Respiratory Failure . Pharmacist Clinical Goal(s) o Over the next 90 days, patient will work with PharmD and providers to help manage symptoms as needed . Current regimen:  o Oxygen therapy o Proventil as needed . Patient self care activities - Over the next 14 days, patient will: o Please follow-up with your lung doctor about the medical equipment you are needing.  Medication management . Pharmacist Clinical Goal(s): o Over the next 90 days, patient will work with PharmD and providers to achieve optimal medication adherence . Current pharmacy: [facility] . Interventions o Comprehensive medication review performed. o Continue current medication management strategy . Patient self care activities - Over the next 180 days, patient will: o Focus on medication adherence by Continue current management o Take medications as prescribed o Report any questions or concerns to PharmD and/or provider(s) Initial goal documentation.      Hypertension / heart failure    BP Readings from Last 3 Encounters:  09/04/19 126/73  08/25/19 116/60  07/25/19 118/62   Lab Results  Component Value Date   K 3.6 07/18/2019   K 3.8 07/10/2019   K 3.5 07/09/2019   Denies dizziness, SOB at rest, chest pain. Actively taking daily weights - at today's visit she again reports no more than 2 lb variation. Potassium is stable. Home BP monitoring with support of son has been encouraged at least once every 1-2 weeks.   Reviewed daily weight recommendations.   Patient is currently controlled on the following medications:   Carvedilol 3.125 mg twice daily  Furosemide 40 mg daily   Lisinopril 20 mg daily  Amlodipine 7.5 mg daily  Potassium cl er 10 meq   Plan  Continue current medications.    Diabetes   Patient reports that she has misplaced her glucometer recently while  preparing for upcoming move. Otherwise, states she is checking FBG daily with readings "always <130" "never <100". Has not gone below 100 past two weeks. Denies any symptoms of hypoglycemia. Declines needing a replacement  glucometer.   Lab Results  Component Value Date/Time   HGBA1C 7.6 (H) 07/05/2019 02:23 PM   HGBA1C 7.7 (H) 04/10/2019 10:13 AM   MICROALBUR 6.8 (H) 02/19/2014 11:12 AM   MICROALBUR 6.4 (H) 10/11/2013 09:32 AM   06/2019 a1c at 7.6% is lowest it has been in over 2 years.    Patient is currently controlled on the following medications:   Metformin 1000 mg twice daily   Lantus 25 units once every night   Plan  Continue current medications.    Osteopenia    Last DEXA Scan: 2016, osteopenia.   Vit D, 25-Hydroxy  Date Value Ref Range Status  04/20/2013 88 30 - 89 ng/mL Final    Comment:    This assay accurately quantifies Vitamin D, which is the sum of the25-Hydroxy forms of Vitamin D2 and D3.  Studies have shown that theoptimum concentration of 25-Hydroxy Vitamin D is 30 ng/mL or higher. Concentrations of Vitamin D between 20 and 29 ng/mL  are considered tobe insufficient and concentrations less than 20 ng/mL are consideredto be deficient for Vitamin D.    Open order for DEXA. Encouraged follow-up on scan. Patient expresses understanding and agrees to f/u.   Plan  F/u with DEXA scan.    RLS / Pain    States will be getting shot in her back June 1st. States pain has been well controlled this year and that symptoms are only bad 1-2x/year.  Denies any side effects including somnolence at this time.  Patient is currently controlled on the following medications:   Gabapentin 100 mg once daily  Pramipexole 1 mg twice daily   Methocarbamol 500 mg every 6 hours as needed   Plan  Continue current medications   Chronic respiratory failure   Denies PND but reports breathing feels better sitting down compared to lying down. Encouraged her to follow-up with  pulmonology regarding DME as she reports she has been waiting on this.   Patient is currently managed withs:   Proventil 2.5mg /3 mL as needed  Oxygen therapy  Plan  Continue current medications. Follow-up with pulmonology today.   Vaccines   Reviewed and discussed patient's vaccination history.    Immunization History  Administered Date(s) Administered  . Fluad Quad(high Dose 65+) 01/24/2019  . Influenza Split 02/20/2011, 01/25/2012  . Influenza Whole 02/16/1999, 02/25/2007, 03/07/2009, 02/14/2010  . Influenza, High Dose Seasonal PF 02/17/2016, 03/23/2017, 01/20/2018  . Influenza,inj,Quad PF,6+ Mos 02/01/2013, 02/19/2014, 01/30/2015  . Influenza,inj,quad, With Preservative 02/15/2017  . PFIZER SARS-COV-2 Vaccination 06/06/2019, 06/26/2019  . Pneumococcal Conjugate-13 01/30/2015  . Pneumococcal Polysaccharide-23 10/11/2013  . Td 05/18/2005  . Tdap 10/21/2015  . Zoster 02/20/2011   Plan  Recommended patient receive Shingrix vaccine in pharmacy.   Medication Management   Receives prescription medications from:  [facility] [place] [facility] [place] [address] [place] [address] Phone: [phone] Fax: [facility] [place], [place] [address] Phone: [phone] Fax: [phone]  Denies any issues organizing or getting her medications.   Plan  Continue current medication management strategy.  Follow up: 3 month phone visit.  ______________ Visit Information SDOH (Social Determinants of Health) assessments performed: Yes.  Ms. Szczesniak was given information about Chronic Care Management services today including:  1. CCM service includes personalized support from designated clinical staff supervised by her physician, including individualized plan of care and coordination with other care providers  2. 24/7 contact phone numbers for assistance for urgent and  routine care needs. 3. Standard insurance, coinsurance, copays and deductibles apply for chronic care management only during months in which we provide at least 20 minutes of these services. Most insurances cover these services at 100%, however patients may be responsible for any copay, coinsurance and/or deductible if applicable. This service may help you avoid the need for more expensive face-to-face services. 4. Only one practitioner may furnish and bill the service in a calendar month. 5. The patient may stop CCM services at any time (effective at the end of the month) by phone call to the office staff.  Patient agreed to services and verbal consent obtained.   Madelin Rear, Pharm.D., BCGP Clinical Pharmacist [facility] [phone]

## 2019-10-05 NOTE — Patient Instructions (Signed)
If you have any questions, please call me at [phone] (direct line) - thank you!  - Edyth Gunnels., Clinical Pharmacist  Remember to follow-up with your lung doctor concerning the medical equipment you are needing. You are also due for your bone scan so please follow-up on that as well. Please review the notes below and let us know if you have any questions.  Goals Addressed            This Visit's Progress   . PharmD Care Plan       CARE PLAN ENTRY Current Barriers:  . Chronic Disease Management support, education, and care coordination needs related to Hypertension, Diabetes, Heart Failure, and RLS   Hypertension / Heart Failure . Pharmacist Clinical Goal(s): o Over the next 90 days, patient will work with PharmD and providers to maintain BP goal <130/80 . Current regimen:   Carvedilol 3.125 mg twice daily  Furosemide 40 mg daily   Lisinopril 20 mg daily  Amlodipine 7.5 mg daily  Potassium cl er 10 meq  . Interventions: o BP monitoring plan . Patient self care activities - Over the next 90 days, patient will: o Check BP at least once every 1-2 weeks, document, and provide at future appointments - Please have son purchase blood pressure monitor with cuff for the upper arm so we can keep an eye on this  o Ensure daily salt intake < 2300 mg/day o Check weight once daily (same time each day) and write this down. If you notice more than a 3 lb weight gain in 1 day or > 5 lb increase in 3 days, you need to our office or the Cardiologist o Restrict fluid to 60 ounces (7.5 cups) or less  Diabetes . Pharmacist Clinical Goal(s): o Over the next 90 days, patient will work with PharmD and providers to maintain A1c goal <8% . Current regimen:   Metformin 1000 mg twice daily   Lantus 25 units once every night  . Interventions: o Please let us know if you are needing a replacement glucometer.  . Patient self care activities - Over the next 90 days, patient will: o Check blood sugar  once daily, document, and provide at future appointments o Contact provider with any episodes of hypoglycemia RLS / Pain . Pharmacist Clinical Goal(s) o Over the next 180 days, patient will work with PharmD and providers to minimize RLS / pain symptoms . Current regimen:   Gabapentin 100 mg once daily  Pramipexole 1 mg twice daily   Methocarbamol 500 mg every 6 hours as needed  . Interventions: o Continue current management . Patient self care activities - Over the next 180 days, patient will: o Continue current management  Respiratory Failure . Pharmacist Clinical Goal(s) o Over the next 90 days, patient will work with PharmD and providers to help manage symptoms as needed . Current regimen:  o Oxygen therapy o Proventil as needed . Patient self care activities - Over the next 14 days, patient will: o Please follow-up with your lung doctor about the medical equipment you are needing.  Medication management . Pharmacist Clinical Goal(s): o Over the next 90 days, patient will work with PharmD and providers to achieve optimal medication adherence . Current pharmacy: [facility] . Interventions o Comprehensive medication review performed. o Continue current medication management strategy . Patient self care activities - Over the next 180 days, patient will: o Focus on medication adherence by Continue current management o Take medications as prescribed o  Report any questions or concerns to PharmD and/or provider(s) Initial goal documentation.      [name] was given information about Chronic Care Management services today including:  1. CCM service includes personalized support from designated clinical staff supervised by her physician, including individualized plan of care and coordination with other care providers 2. 24/7 contact phone numbers for assistance for urgent and routine care needs. 3. Standard insurance, coinsurance, copays and deductibles apply for chronic  care management only during months in which we provide at least 20 minutes of these services. Most insurances cover these services at 100%, however patients may be responsible for any copay, coinsurance and/or deductible if applicable. This service may help you avoid the need for more expensive face-to-face services. 4. Only one practitioner may furnish and bill the service in a calendar month. 5. The patient may stop CCM services at any time (effective at the end of the month) by phone call to the office staff.  Patient agreed to services and verbal consent obtained.   The patient verbalized understanding of instructions provided today and agreed to receive a mailed copy of patient instruction and/or educational materials. Telephone follow up appointment with pharmacy team member scheduled for: See next appointment with "Care Management Staff" under "What's Next" below.   Thank you!  [name], Pharm.D., BCGP Clinical Pharmacist [facility] [phone]  Hypertension, Adult High blood pressure (hypertension) is when the force of blood pumping through the arteries is too strong. The arteries are the blood vessels that carry blood from the heart throughout the body. Hypertension forces the heart to work harder to pump blood and may cause arteries to become narrow or stiff. Untreated or uncontrolled hypertension can cause a heart attack, heart failure, a stroke, kidney disease, and other problems. A blood pressure reading consists of a higher number over a lower number. Ideally, your blood pressure should be below 120/80. The first ("top") number is called the systolic pressure. It is a measure of the pressure in your arteries as your heart beats. The second ("bottom") number is called the diastolic pressure. It is a measure of the pressure in your arteries as the heart relaxes. What are the causes? The exact cause of this condition is not known. There are some  conditions that result in or are related to high blood pressure. What increases the risk? Some risk factors for high blood pressure are under your control. The following factors may make you more likely to develop this condition:  Smoking.  Having type 2 diabetes mellitus, high cholesterol, or both.  Not getting enough exercise or physical activity.  Being overweight.  Having too much fat, sugar, calories, or salt (sodium) in your diet.  Drinking too much alcohol. Some risk factors for high blood pressure may be difficult or impossible to change. Some of these factors include:  Having chronic kidney disease.  Having a family history of high blood pressure.  Age. Risk increases with age.  Race. You may be at higher risk if you are African American.  Gender. Men are at higher risk than women before age 3. After age 30, women are at higher risk than men.  Having obstructive sleep apnea.  Stress. What are the signs or symptoms? High blood pressure may not cause symptoms. Very high blood pressure (hypertensive crisis) may cause:  Headache.  Anxiety.  Shortness of breath.  Nosebleed.  Nausea and vomiting.  Vision changes.  Severe chest pain.  Seizures. How is  this diagnosed? This condition is diagnosed by measuring your blood pressure while you are seated, with your arm resting on a flat surface, your legs uncrossed, and your feet flat on the floor. The cuff of the blood pressure monitor will be placed directly against the skin of your upper arm at the level of your heart. It should be measured at least twice using the same arm. Certain conditions can cause a difference in blood pressure between your right and left arms. Certain factors can cause blood pressure readings to be lower or higher than normal for a short period of time:  When your blood pressure is higher when you are in a health care provider's office than when you are at home, this is called white coat  hypertension. Most people with this condition do not need medicines.  When your blood pressure is higher at home than when you are in a health care provider's office, this is called masked hypertension. Most people with this condition may need medicines to control blood pressure. If you have a high blood pressure reading during one visit or you have normal blood pressure with other risk factors, you may be asked to:  Return on a different day to have your blood pressure checked again.  Monitor your blood pressure at home for 1 week or longer. If you are diagnosed with hypertension, you may have other blood or imaging tests to help your health care provider understand your overall risk for other conditions. How is this treated? This condition is treated by making healthy lifestyle changes, such as eating healthy foods, exercising more, and reducing your alcohol intake. Your health care provider may prescribe medicine if lifestyle changes are not enough to get your blood pressure under control, and if:  Your systolic blood pressure is above 130.  Your diastolic blood pressure is above 80. Your personal target blood pressure may vary depending on your medical conditions, your age, and other factors. Follow these instructions at home: Eating and drinking   Eat a diet that is high in fiber and potassium, and low in sodium, added sugar, and fat. An example eating plan is called the DASH (Dietary Approaches to Stop Hypertension) diet. To eat this way: ? Eat plenty of fresh fruits and vegetables. Try to fill one half of your plate at each meal with fruits and vegetables. ? Eat whole grains, such as whole-wheat pasta, brown rice, or whole-grain bread. Fill about one fourth of your plate with whole grains. ? Eat or drink low-fat dairy products, such as skim milk or low-fat yogurt. ? Avoid fatty cuts of meat, processed or cured meats, and poultry with skin. Fill about one fourth of your plate with lean  proteins, such as fish, chicken without skin, beans, eggs, or tofu. ? Avoid pre-made and processed foods. These tend to be higher in sodium, added sugar, and fat.  Reduce your daily sodium intake. Most people with hypertension should eat less than 1,500 mg of sodium a day.  Do not drink alcohol if: ? Your health care provider tells you not to drink. ? You are pregnant, may be pregnant, or are planning to become pregnant.  If you drink alcohol: ? Limit how much you use to:  0-1 drink a day for women.  0-2 drinks a day for men. ? Be aware of how much alcohol is in your drink. In the [place] one drink equals one 12 oz bottle of beer (355 mL), one 5 oz glass of wine (148  mL), or one 1 oz glass of hard liquor (44 mL). Lifestyle   Work with your health care provider to maintain a healthy body weight or to lose weight. Ask what an ideal weight is for you.  Get at least 30 minutes of exercise most days of the week. Activities may include walking, swimming, or biking.  Include exercise to strengthen your muscles (resistance exercise), such as Pilates or lifting weights, as part of your weekly exercise routine. Try to do these types of exercises for 30 minutes at least 3 days a week.  Do not use any products that contain nicotine or tobacco, such as cigarettes, e-cigarettes, and chewing tobacco. If you need help quitting, ask your health care provider.  Monitor your blood pressure at home as told by your health care provider.  Keep all follow-up visits as told by your health care provider. This is important. Medicines  Take over-the-counter and prescription medicines only as told by your health care provider. Follow directions carefully. Blood pressure medicines must be taken as prescribed.  Do not skip doses of blood pressure medicine. Doing this puts you at risk for problems and can make the medicine less effective.  Ask your health care provider about side effects or reactions to  medicines that you should watch for. Contact a health care provider if you:  Think you are having a reaction to a medicine you are taking.  Have headaches that keep coming back (recurring).  Feel dizzy.  Have swelling in your ankles.  Have trouble with your vision. Get help right away if you:  Develop a severe headache or confusion.  Have unusual weakness or numbness.  Feel faint.  Have severe pain in your chest or abdomen.  Vomit repeatedly.  Have trouble breathing. Summary  Hypertension is when the force of blood pumping through your arteries is too strong. If this condition is not controlled, it may put you at risk for serious complications.  Your personal target blood pressure may vary depending on your medical conditions, your age, and other factors. For most people, a normal blood pressure is less than 120/80.  Hypertension is treated with lifestyle changes, medicines, or a combination of both. Lifestyle changes include losing weight, eating a healthy, low-sodium diet, exercising more, and limiting alcohol. This information is not intended to replace advice given to you by your health care provider. Make sure you discuss any questions you have with your health care provider. Document Revised: 01/12/2018 Document Reviewed: 01/12/2018 Elsevier Patient Education  2020 [employer].

## 2019-10-17 DIAGNOSIS — M5136 Other intervertebral disc degeneration, lumbar region: Secondary | ICD-10-CM | POA: Diagnosis not present

## 2019-10-25 ENCOUNTER — Telehealth: Payer: Self-pay | Admitting: Physician Assistant

## 2019-10-25 NOTE — Telephone Encounter (Signed)
Please advise this 

## 2019-10-25 NOTE — Telephone Encounter (Signed)
Patient's daughter states that her mom has not taken the Gabapentin in over a month now - has not noticed any changes in how she feels - does she need to continue - Please send to [facility] in pharmacy if she needs to continue.  Patient also said that her mom is on Norvasc - the rx she has from [facility] is  7.5 mg  - [employer] has the rx as 5.0 - if she needs the 7.5 please send to [facility].  Patient also needs refills sent in on Mirapex 1mg . And Insulin - Lantus 8100 sent to [facility] also.  Thanks.

## 2019-10-26 ENCOUNTER — Other Ambulatory Visit: Payer: Self-pay

## 2019-10-26 DIAGNOSIS — G4733 Obstructive sleep apnea (adult) (pediatric): Secondary | ICD-10-CM

## 2019-10-26 DIAGNOSIS — E662 Morbid (severe) obesity with alveolar hypoventilation: Secondary | ICD-10-CM

## 2019-10-26 DIAGNOSIS — E1140 Type 2 diabetes mellitus with diabetic neuropathy, unspecified: Secondary | ICD-10-CM

## 2019-10-26 DIAGNOSIS — I10 Essential (primary) hypertension: Secondary | ICD-10-CM

## 2019-10-26 MED ORDER — LANTUS SOLOSTAR 100 UNIT/ML ~~LOC~~ SOPN: 25.0000 [IU] | PEN_INJECTOR | Freq: Every day | SUBCUTANEOUS | 1 refills | Status: DC

## 2019-10-26 MED ORDER — PRAMIPEXOLE DIHYDROCHLORIDE 1 MG PO TABS: 1.0000 mg | ORAL_TABLET | Freq: Two times a day (BID) | ORAL | 0 refills | Status: DC

## 2019-10-26 NOTE — Telephone Encounter (Signed)
Order was sent on 5/13 and confirmation received from [place] on 5/14.  Called [facility] today and spoke to [place].  She is going to check with Christal and call me back.

## 2019-10-26 NOTE — Telephone Encounter (Signed)
Called and spoke with patients daughter Judson Roch informed her that the medication Mirapex 1mg  and insulin Lantus was refilled and sent to [facility], but she will have to contact Cardiology for refills on Amlodipine.

## 2019-10-26 NOTE — Telephone Encounter (Signed)
Called [facility] to check on status of Trilogy.  Order was placed 09/21/19, but [facility] states they have never received a order for anything on this Patient.   Message routed to [facility] to assist

## 2019-10-26 NOTE — Telephone Encounter (Signed)
If she has been off of the Gabapentin for a few months and doing fine, no need to restart. Please remove from her med list.  Ok to send in refills of her Mirapex and Insulin. The amlodipine is prescribed by Cardiology. They will need to contact them for refill.

## 2019-10-27 NOTE — Telephone Encounter (Signed)
No response from [facility] today.  Called & spoke to [place].  Nira Conn was on another line but Jeanett Schlein asked Nira Conn about this & Nira Conn said she spoke to [place] yesterday & per Christal pt did not qualify for Trilogy.  She is out of the office today and she will have her to call me Monday.

## 2019-10-30 NOTE — Telephone Encounter (Signed)
I spoke to [place] with [facility].  She states order that was sent in has dx of hypoventilation syndrome and this will not qualify pt for Trilogy. Christal's phone # is [phone].

## 2019-10-30 NOTE — Telephone Encounter (Signed)
Dr. Elsworth Soho, please see messages about dx that was used for the Trilogy and advise on an alternate dx. Thanks!

## 2019-10-31 DIAGNOSIS — R062 Wheezing: Secondary | ICD-10-CM | POA: Diagnosis not present

## 2019-10-31 DIAGNOSIS — I5032 Chronic diastolic (congestive) heart failure: Secondary | ICD-10-CM | POA: Diagnosis not present

## 2019-10-31 DIAGNOSIS — G4733 Obstructive sleep apnea (adult) (pediatric): Secondary | ICD-10-CM | POA: Diagnosis not present

## 2019-10-31 NOTE — Telephone Encounter (Signed)
If patient does not qualify for trilogy based on diagnosis, Please send prescription for auto BiPAP , pressure support +4 cm, IPAP maximum 20, EPAP minimum 5 cm -Diagnosis of OSA/obesity hypoventilation syndrome, intolerant of CPAP -Please have DME respond ASAP , if not please send to different DME  -Also based on prior phone notes, please schedule spirometry pre and post  -Please let family know reason for delay

## 2019-10-31 NOTE — Telephone Encounter (Signed)
Attempted to call patient to schedule spirometry pre and post, unable to leave a message. Will attempt to call later today.

## 2019-11-13 ENCOUNTER — Telehealth: Payer: Self-pay | Admitting: Physician Assistant

## 2019-11-13 ENCOUNTER — Telehealth: Payer: Self-pay | Admitting: Pulmonary Disease

## 2019-11-13 NOTE — Telephone Encounter (Signed)
Called and spoke with patient about BIPAP machine. Let her know that order was placed on 6/15 and that I sent a message to [employer] to get an update and would reach back out to them once I got a response.  Attempted to schedule patient for Spirometry test pre and post patient state's that her daughter is going to call back to schedule test. Will update once I get a response.

## 2019-11-13 NOTE — Telephone Encounter (Signed)
Pt called in asking if she can have her CO2 levels checked, please advise//ELEA

## 2019-11-13 NOTE — Telephone Encounter (Signed)
Pt's spiro appt has been scheduled for 7/9.  Routing to PCCs in regards to pt's BIPAP.

## 2019-11-14 NOTE — Telephone Encounter (Signed)
Pt is aware of this and will talk with daughter about this.

## 2019-11-14 NOTE — Telephone Encounter (Signed)
Miquel Dunn sent to Benny Lennert, Chauncy Passy; Kem Kays We will contact patient with update. Thank you!

## 2019-11-14 NOTE — Telephone Encounter (Signed)
Message sent to [employer] for an update.  

## 2019-11-14 NOTE — Telephone Encounter (Signed)
Left a detailed message on the patient's vm as directed by patient's DPR regarding PCP's recommendations.

## 2019-11-14 NOTE — Telephone Encounter (Signed)
Happy to check lab level to assess but really she should discuss with Pulmonology who follows her for chronic respiratory issues.

## 2019-11-15 ENCOUNTER — Encounter: Payer: Self-pay | Admitting: Physician Assistant

## 2019-11-15 DIAGNOSIS — M8589 Other specified disorders of bone density and structure, multiple sites: Secondary | ICD-10-CM

## 2019-11-16 ENCOUNTER — Other Ambulatory Visit: Payer: Self-pay | Admitting: Physician Assistant

## 2019-11-16 DIAGNOSIS — Z1231 Encounter for screening mammogram for malignant neoplasm of breast: Secondary | ICD-10-CM

## 2019-11-23 DIAGNOSIS — G894 Chronic pain syndrome: Secondary | ICD-10-CM | POA: Diagnosis not present

## 2019-11-23 DIAGNOSIS — G4709 Other insomnia: Secondary | ICD-10-CM | POA: Diagnosis not present

## 2019-11-23 DIAGNOSIS — M5136 Other intervertebral disc degeneration, lumbar region: Secondary | ICD-10-CM | POA: Diagnosis not present

## 2019-11-23 DIAGNOSIS — M5416 Radiculopathy, lumbar region: Secondary | ICD-10-CM | POA: Diagnosis not present

## 2019-11-24 ENCOUNTER — Other Ambulatory Visit: Payer: Medicare HMO

## 2019-11-24 ENCOUNTER — Other Ambulatory Visit: Payer: Self-pay

## 2019-11-24 DIAGNOSIS — J9611 Chronic respiratory failure with hypoxia: Secondary | ICD-10-CM

## 2019-11-30 DIAGNOSIS — G4733 Obstructive sleep apnea (adult) (pediatric): Secondary | ICD-10-CM | POA: Diagnosis not present

## 2019-11-30 DIAGNOSIS — R062 Wheezing: Secondary | ICD-10-CM | POA: Diagnosis not present

## 2019-11-30 DIAGNOSIS — Z7189 Other specified counseling: Secondary | ICD-10-CM | POA: Insufficient documentation

## 2019-11-30 DIAGNOSIS — I5032 Chronic diastolic (congestive) heart failure: Secondary | ICD-10-CM | POA: Diagnosis not present

## 2019-11-30 NOTE — Progress Notes (Signed)
Cardiology Office Note   Date:  12/01/2019   ID:  [name], DOB 1940-09-09, MRN [id number]  PCP:  Vanessa Jeans, PA-C  Cardiologist:   Vanessa Breeding, MD   Chief Complaint  Patient presents with  . Shortness of Breath      History of Present Illness: [name] is a 79 y.o. female who presents for ongoing assessment of dyspnea and diastolic dysfunction.   She was very fatigued and we backed off on Norvasc at the last visit.  She is upset because she is not gotten BiPAP which was to be prescribed for her.  She is been feeling poorly.  She did not feel any less fatigued on the decreased dose of Norvasc.  She is simply having trouble cutting the pills.  She has had increased abdominal distention and shortness of breath.  Her weights have been stable however.  Her lower extremity swelling has been chronic.  She is not describing new PND or orthopnea but she always sleeps with the head of elevated or she sleeps in a chair.  She watches her salt for the most part.  She is not drinking a lot of fluid.  She is not having any new chest pressure, neck or arm discomfort.  She is not having any new palpitations, presyncope or syncope.  She does wear O2 at night.  Is very limited by back pain and mobility.  Past Medical History:  Diagnosis Date  . AK (actinic keratosis)   . Anxiety    occasional  . Breast cancer ([place])    R breast, s/p radiation 34Gy/36fx 04/29/10-05/05/10  . CTS (carpal tunnel syndrome)    Left  . Diabetes ([place])   . Diastolic CHF ([place])    Dx w/ hospitalization 06/2015 for respiratory failure  . Facial asymmetry, acquired 08/20/2016   From injury  . Falls frequently    "can not pick left leg up"  . Hearing loss    bilateral hearing aides  . History of environmental allergies   . History of kidney stones 20 years ago  . Hypertension   . MDD (major depressive disorder)   . No blood products ([place])  09/11/2013  . Osteoarthritis    hx shoulder, knee,  back, wrist pain; saw Dr. Wynelle Link   . Personal history of radiation therapy   . RESTLESS LEGS SYNDROME 05/05/2007   Qualifier: Diagnosis of  By: Vanessa Greet MD, Armando Reichert   . RLS (restless legs syndrome)   . Sleep apnea     Past Surgical History:  Procedure Laterality Date  . APPENDECTOMY  age 27  . BREAST LUMPECTOMY Right 2012  . BREAST SURGERY  2012   rt breast lumpectomy  . flex signoidoscopy    . INCISION AND DRAINAGE PERIRECTAL ABSCESS    . JOINT REPLACEMENT Right 2006   knee  . TOTAL KNEE ARTHROPLASTY Left 08/06/2014   Procedure: LEFT TOTAL KNEE ARTHROPLASTY;  Surgeon: Gaynelle Arabian, MD;  Location: [facility];  Service: Orthopedics;  Laterality: Left;  . TUBAL LIGATION  1979     Current Outpatient Medications  Medication Sig Dispense Refill  . acetaminophen (TYLENOL) 325 MG tablet Take 2 tablets (650 mg total) by mouth every 6 (six) hours as needed for mild pain (or Fever >/= 101). 12 tablet 0  . albuterol (PROVENTIL) (2.5 MG/3ML) 0.083% nebulizer solution Take 3 mLs (2.5 mg total) by nebulization every 6 (six) hours as needed for wheezing or shortness of breath. 75 mL 12  .  amLODipine (NORVASC) 5 MG tablet Take 1.5 tablets (7.5 mg total) by mouth daily. 135 tablet 2  . carvedilol (COREG) 3.125 MG tablet Take 1 tablet (3.125 mg total) by mouth 2 (two) times daily. 60 tablet 2  . furosemide (LASIX) 80 MG tablet Take 1 tablet (80 mg total) by mouth daily. 90 tablet 1  . gabapentin (NEURONTIN) 300 MG capsule Take 300 mg by mouth at bedtime.    . insulin glargine (LANTUS SOLOSTAR) 100 UNIT/ML Solostar Pen Inject 25 Units into the skin at bedtime. 15 mL 1  . lisinopril (ZESTRIL) 20 MG tablet TAKE 1 TABLET (20 MG TOTAL) BY MOUTH DAILY. 90 tablet 2  . metFORMIN (GLUCOPHAGE) 1000 MG tablet Take 1 tablet (1,000 mg total) by mouth 2 (two) times daily. 180 tablet 2  . methocarbamol (ROBAXIN) 500 MG tablet Take 500 mg by mouth every 6 (six) hours as needed for muscle spasms. Reported on 09/25/2015     . potassium chloride (KLOR-CON) 10 MEQ tablet TAKE 1 TABLET (10 MEQ TOTAL) BY MOUTH DAILY. 90 tablet 3  . pramipexole (MIRAPEX) 1 MG tablet Take 1 tablet (1 mg total) by mouth 2 (two) times daily. 180 tablet 0   No current facility-administered medications for this visit.    Allergies:   Sulfa antibiotics, Sulfonamide derivatives, and Other    ROS:  Please see the history of present illness.   Otherwise, review of systems are positive for none.   All other systems are reviewed and negative.    PHYSICAL EXAM: VS:  BP 138/90   Pulse 81   Temp (!) 94.3 F (34.6 C)   Ht 5' (1.524 m)   Wt 214 lb 6.4 oz (97.3 kg)   SpO2 91%   BMI 41.87 kg/m  , BMI Body mass index is 41.87 kg/m. GENERAL: Chronically ill appearing NECK:  No jugular venous distention, waveform within normal limits, carotid upstroke brisk and symmetric, no bruits, no thyromegaly LUNGS:  Clear to auscultation bilaterally CHEST:  Unremarkable HEART:  PMI not displaced or sustained,S1 and S2 within normal limits, no S3, no S4, no clicks, no rubs, no murmurs ABD:  Flat, positive bowel sounds normal in frequency in pitch, no bruits, no rebound, no guarding, no midline pulsatile mass, no hepatomegaly, no splenomegaly, pannus with dependent edema EXT:  2 plus pulses throughout, moderately severe lower extremity edema, no cyanosis no clubbing   EKG:  EKG is not ordered today. NA   Recent Labs: 12/27/2018: ALT 21 07/07/2019: B Natriuretic Peptide 53.0 07/08/2019: TSH 0.902 07/18/2019: BUN 25; Creatinine, Ser 0.68; Hemoglobin 12.9; Platelets 288.0; Potassium 3.6; Sodium 139    Lipid Panel    Component Value Date/Time   CHOL 136 12/27/2018 1037   TRIG 113.0 12/27/2018 1037   HDL 34.30 (L) 12/27/2018 1037   CHOLHDL 4 12/27/2018 1037   VLDL 22.6 12/27/2018 1037   LDLCALC 79 12/27/2018 1037   LDLDIRECT 111.6 02/14/2010 1406      Wt Readings from Last 3 Encounters:  12/01/19 214 lb 6.4 oz (97.3 kg)  09/04/19 217 lb 9.6  oz (98.7 kg)  08/25/19 216 lb 9.6 oz (98.2 kg)      Other studies Reviewed: Additional studies/ records that were reviewed today include: Labs. Review of the above records demonstrates:  Please see elsewhere in the note.     ASSESSMENT AND PLAN:  Hypertension:    Her blood pressure is upper limits of normal.  She is having trouble reducing the dose of her amlodipine to  7 and half milligrams and it really did not do anything to reduce her edema or fatigue.  I will go back to 10 mg.   Chronic diastolic CHF:   I will check a basic metabolic profile today.  She does seem volume overloaded.  I am going to increase her Lasix to 80 mg daily.  She needs to come back next week for a basic metabolic profile and follow-up visit.  I will check a BNP.  O2 Dependent dyspnea:    She was to be treated with BiPAP.    I sent a message to Dr. Elsworth Soho.  I do see that there were messages and orders apparently sent but she has not received this.   Covid education: She has been vaccinated.   Current medicines are reviewed at length with the patient today.  The patient does not have concerns regarding medicines.  The following changes have been made:  no change  Labs/ tests ordered today include:   Orders Placed This Encounter  Procedures  . Basic metabolic panel  . Brain natriuretic peptide     Disposition:   FU with in one week as above.     Signed, Vanessa Breeding, MD  12/01/2019 1:17 PM    [facility]

## 2019-12-01 ENCOUNTER — Other Ambulatory Visit: Payer: Self-pay

## 2019-12-01 ENCOUNTER — Telehealth: Payer: Self-pay | Admitting: Pulmonary Disease

## 2019-12-01 ENCOUNTER — Ambulatory Visit: Payer: Medicare HMO | Admitting: Cardiology

## 2019-12-01 ENCOUNTER — Encounter: Payer: Self-pay | Admitting: Cardiology

## 2019-12-01 VITALS — BP 138/90 | HR 81 | Temp 94.3°F | Ht 60.0 in | Wt 214.4 lb

## 2019-12-01 DIAGNOSIS — Z7189 Other specified counseling: Secondary | ICD-10-CM | POA: Diagnosis not present

## 2019-12-01 DIAGNOSIS — I10 Essential (primary) hypertension: Secondary | ICD-10-CM | POA: Diagnosis not present

## 2019-12-01 DIAGNOSIS — I5032 Chronic diastolic (congestive) heart failure: Secondary | ICD-10-CM | POA: Diagnosis not present

## 2019-12-01 DIAGNOSIS — I152 Hypertension secondary to endocrine disorders: Secondary | ICD-10-CM

## 2019-12-01 DIAGNOSIS — E1159 Type 2 diabetes mellitus with other circulatory complications: Secondary | ICD-10-CM

## 2019-12-01 MED ORDER — FUROSEMIDE 80 MG PO TABS: 80.0000 mg | ORAL_TABLET | Freq: Every day | ORAL | 1 refills | Status: DC

## 2019-12-01 NOTE — Patient Instructions (Signed)
Medication Instructions:  Increase Lasix to 80 mg daily   *If you need a refill on your cardiac medications before your next appointment, please call your pharmacy*   Lab Work: BMET, BNP today   If you have labs (blood work) drawn today and your tests are completely normal, you will receive your results only by:  [place] (if you have MyChart) OR  A paper copy in the mail If you have any lab test that is abnormal or we need to change your treatment, we will call you to review the results.   Follow-Up: At [facility], you and your health needs are our priority.  As part of our continuing mission to provide you with exceptional heart care, we have created designated Provider Care Teams.  These Care Teams include your primary Cardiologist (physician) and Advanced Practice Providers (APPs -  Physician Assistants and Nurse Practitioners) who all work together to provide you with the care you need, when you need it.  We recommend signing up for the patient portal called "MyChart".  Sign up information is provided on this After Visit Summary.  MyChart is used to connect with patients for Virtual Visits (Telemedicine).  Patients are able to view lab/test results, encounter notes, upcoming appointments, etc.  Non-urgent messages can be sent to your provider as well.   To learn more about what you can do with MyChart, go to [email or web address].    Your next appointment:   1 week(s)  The format for your next appointment:   In Person  Provider:    You may see Minus Breeding, MD or any of the following Advanced Practice Providers/ DOD

## 2019-12-01 NOTE — Telephone Encounter (Signed)
His patient has still not received BiPAP/NIV from an order placed in April by [facility] This is not acceptable patient care Can we please get me a direct line for responsible DME so I can talk & take care of this problem ?

## 2019-12-01 NOTE — Telephone Encounter (Signed)
I have sent a high priority message to [employer].  Melissa's number is [phone] or our customer service line is [phone]

## 2019-12-02 LAB — BASIC METABOLIC PANEL
BUN/Creatinine Ratio: 21 (ref 12–28)
BUN: 15 mg/dL (ref 8–27)
CO2: 30 mmol/L — ABNORMAL HIGH (ref 20–29)
Calcium: 9.1 mg/dL (ref 8.7–10.3)
Chloride: 93 mmol/L — ABNORMAL LOW (ref 96–106)
Creatinine, Ser: 0.73 mg/dL (ref 0.57–1.00)
GFR calc Af Amer: 90 mL/min/{1.73_m2} (ref >59)
GFR calc non Af Amer: 78 mL/min/{1.73_m2} (ref >59)
Glucose: 74 mg/dL (ref 65–99)
Potassium: 4 mmol/L (ref 3.5–5.2)
Sodium: 140 mmol/L (ref 134–144)

## 2019-12-02 LAB — BRAIN NATRIURETIC PEPTIDE: BNP: 201.4 pg/mL — ABNORMAL HIGH (ref 0.0–100.0)

## 2019-12-04 NOTE — Telephone Encounter (Signed)
Dr. Elsworth Soho, please see mychart message and advise.

## 2019-12-04 NOTE — Telephone Encounter (Signed)
Please send this Rx to different DME with [facility] original OV note & order please  There is a paper trail on this from April This pt cannot do another sleep study & needs a machine Bipap or NIV ASAP

## 2019-12-04 NOTE — Telephone Encounter (Signed)
Miquel Dunn sent to Benny Lennert, Chauncy Passy; Kem Kays Yes we have a copy of that sleep study. However, Per note from our processing team :  It seems that the patient has not used the CPAP in over a year; therefore she is not compliant. It seems that she was perhaps in the hospital and while there, they treated her with a bipap and that prompted the RX. [facility] requires compliance, use and benefit notes.   Also: There are notes in the account that state that the patient would have to return for a new sleep study and titration. The notes the pt will have to start over with a new sleep study and titration.  We will reach out to the patient to let them know what is needed to process this order.  Thank you!

## 2019-12-04 NOTE — Telephone Encounter (Signed)
Spoke to Dr. Elsworth Soho about his.  He is going to follow up on the order w/ Melissa w/ [employer].

## 2019-12-04 NOTE — Telephone Encounter (Signed)
PCCs please advise if a new order is needed, or if the original order can be used.  Thanks!

## 2019-12-04 NOTE — Telephone Encounter (Signed)
Vella Kohler D sent to Miquel Dunn; Darlina Guys; Kem Kays The sleep study is located in Epic under procedures dated 03/13/2016. I am not in the office until later this morning, but I will fax it to you once I get in the office.   Thank you,  Earnest Bailey       Previous Messages   ----- Message -----  From: Miquel Dunn  Sent: 12/01/2019  4:57 PM EDT  To: Darlina Guys, Kem Kays, *  Subject: RE: bipap supplies                [place],   The following email was sent 3 days ago. See below.   Thanks   New, [place] Sent to [place], CMA; New, [employer],   I received word back from the processing team that they are in need of the following to finish the processing of this order.  1. We need a copy of the patient's sleep study and titration. We have looked on our EMR access, but it seems that it is not available to [place]. The sleep study on file looks incomplete. We need a diagnostic sleep study and a titration to process this patient's order. Thanks

## 2019-12-05 NOTE — Telephone Encounter (Signed)
Lenna Sciara is working on this & will provide an update.

## 2019-12-05 NOTE — Telephone Encounter (Signed)
I have spoken to [name] myself. This has been a very frustrating experience since she has not been able to get BiPAP for the last few months.  I have also tried to get her CPAP or NIV instead. We will try to obtain this with the diagnosis of obesity hypoventilation and see if we can avoid a sleep study.  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

## 2019-12-05 NOTE — Telephone Encounter (Signed)
BiPap is not going to work FEV1  needs to be above 50% & sleep study is too old.  So, pt is  able to go the NIV route.  Per Lenna Sciara, ok to call her at [phone] to discuss documentation.

## 2019-12-05 NOTE — Telephone Encounter (Signed)
Spoke to [place]. Addendum made to my note from 3/9 for NIV Please follow-up in 24 hours to make sure that necessary documentation has been submitted to insurance for NIV and then let patient know

## 2019-12-05 NOTE — Telephone Encounter (Signed)
Dr. Elsworth Soho, please advise when you have a diagnosis that we can be able to use so we can get pt what she needs.

## 2019-12-06 MED ORDER — AMLODIPINE BESYLATE 5 MG PO TABS: 7.5000 mg | ORAL_TABLET | Freq: Every day | ORAL | 2 refills | Status: DC

## 2019-12-06 NOTE — Telephone Encounter (Signed)
Nothing further needed at this time- will close encounter.  

## 2019-12-06 NOTE — Telephone Encounter (Signed)
[name] states got all the information they needed. [name] phone number is [phone].

## 2019-12-06 NOTE — Telephone Encounter (Signed)
Afer d/w Lenna Sciara, I have amended the note from 07/2019 using diagnoses of -obesity hypoventilation -restrictive lung disease  [employer] will run it by insurance to see if she will qualify for NIV or Bipap

## 2019-12-06 NOTE — Telephone Encounter (Signed)
LMTCB(Melissa)  to confirm receipt of addended note.

## 2019-12-07 ENCOUNTER — Ambulatory Visit
Admission: RE | Admit: 2019-12-07 | Discharge: 2019-12-07 | Disposition: A | Discharge status: Home / Self Care | Payer: Medicare HMO | Source: Ambulatory Visit | Attending: Physician Assistant | Admitting: Physician Assistant

## 2019-12-07 ENCOUNTER — Other Ambulatory Visit: Payer: Self-pay

## 2019-12-07 DIAGNOSIS — Z780 Asymptomatic menopausal state: Secondary | ICD-10-CM | POA: Diagnosis not present

## 2019-12-07 DIAGNOSIS — M810 Age-related osteoporosis without current pathological fracture: Secondary | ICD-10-CM | POA: Diagnosis not present

## 2019-12-07 DIAGNOSIS — Z1231 Encounter for screening mammogram for malignant neoplasm of breast: Secondary | ICD-10-CM | POA: Diagnosis not present

## 2019-12-07 DIAGNOSIS — M5136 Other intervertebral disc degeneration, lumbar region: Secondary | ICD-10-CM | POA: Diagnosis not present

## 2019-12-07 DIAGNOSIS — M8589 Other specified disorders of bone density and structure, multiple sites: Secondary | ICD-10-CM

## 2019-12-07 NOTE — Telephone Encounter (Signed)
Darlina Guys sent to Rigoberto Noel, MD; Vella Kohler D Good afternoon!  Just keeping you in the loop. Our RT contacted this patient this morning about setting up her NIV today but the patient advised that she has appointments all day today. We will be setting her up tomorrow at 11:00.   Let me know if you have any questions.  Thank you for letting us help care for your patient.   Melissa  [facility]  [phone]

## 2019-12-08 ENCOUNTER — Other Ambulatory Visit: Payer: Self-pay

## 2019-12-08 ENCOUNTER — Ambulatory Visit: Payer: Medicare HMO | Admitting: Cardiovascular Disease

## 2019-12-08 ENCOUNTER — Encounter: Payer: Self-pay | Admitting: Cardiovascular Disease

## 2019-12-08 VITALS — BP 131/76 | HR 83 | Ht 60.0 in | Wt 210.4 lb

## 2019-12-08 DIAGNOSIS — J9611 Chronic respiratory failure with hypoxia: Secondary | ICD-10-CM

## 2019-12-08 DIAGNOSIS — I5033 Acute on chronic diastolic (congestive) heart failure: Secondary | ICD-10-CM | POA: Diagnosis not present

## 2019-12-08 DIAGNOSIS — Z794 Long term (current) use of insulin: Secondary | ICD-10-CM

## 2019-12-08 DIAGNOSIS — I10 Essential (primary) hypertension: Secondary | ICD-10-CM

## 2019-12-08 DIAGNOSIS — I5032 Chronic diastolic (congestive) heart failure: Secondary | ICD-10-CM

## 2019-12-08 DIAGNOSIS — E118 Type 2 diabetes mellitus with unspecified complications: Secondary | ICD-10-CM

## 2019-12-08 DIAGNOSIS — R0602 Shortness of breath: Secondary | ICD-10-CM | POA: Diagnosis not present

## 2019-12-08 MED ORDER — AMLODIPINE BESYLATE 10 MG PO TABS: 10.0000 mg | ORAL_TABLET | Freq: Every day | ORAL | 3 refills | Status: DC

## 2019-12-08 NOTE — Progress Notes (Signed)
Cardiology Office Note   Date:  12/10/2019   ID:  [name], DOB [birth year], MRN [id number]  PCP:  Brunetta Jeans, PA-C  Cardiologist:   Minus Breeding, MD   Chief Complaint  Patient presents with  . Shortness of Breath      History of Present Illness: [name] is a 79 y.o. female with a recent episode of acute on chronic diastolic heart failure, dose of oral diuretics increased after her last appointment with Dr. Percival Spanish.  Although her increased dose of diuretic has had some effect (she has lost about 4 pounds and has some reduction in the lower extremity edema) she continues to feel poorly.  She still sleeps with the head of the bed elevated or sleeps in a chair;  still has enough swelling in her legs to make it difficult to put her shoes on.  She she has not had any problems with hypotension or dizziness, but she often "falls asleep and falls".  She denies angina, palpitations or true syncope.  She has not had claudication or focal neurological complaints.  Her chronic problems include type 2 diabetes mellitus for which she takes insulin, obstructive sleep apnea (uses oxygen at night but still does not have CPAP), systemic hypertension, obesity.  Echocardiogram from February 2021 shows normal LVEF 60-65%, mild-moderate LVH and diastolic dysfunction grade 1.  Past Medical History:  Diagnosis Date  . AK (actinic keratosis)   . Anxiety    occasional  . Breast cancer ([place])    R breast, s/p radiation 34Gy/76fx 04/29/10-05/05/10  . CTS (carpal tunnel syndrome)    Left  . Diabetes ([place])   . Diastolic CHF ([place])    Dx w/ hospitalization 06/2015 for respiratory failure  . Facial asymmetry, acquired 08/20/2016   From injury  . Falls frequently    "can not pick left leg up"  . Hearing loss    bilateral hearing aides  . History of environmental allergies   . History of kidney stones 20 years ago  . Hypertension   . MDD (major depressive disorder)   . No blood  products ([place])  09/11/2013  . Osteoarthritis    hx shoulder, knee, back, wrist pain; saw Dr. Wynelle Link   . Personal history of radiation therapy   . RESTLESS LEGS SYNDROME 05/05/2007   Qualifier: Diagnosis of  By: Gwenette Greet MD, Armando Reichert   . RLS (restless legs syndrome)   . Sleep apnea     Past Surgical History:  Procedure Laterality Date  . APPENDECTOMY  age 37  . BREAST LUMPECTOMY Right 2012  . BREAST SURGERY  2012   rt breast lumpectomy  . flex signoidoscopy    . INCISION AND DRAINAGE PERIRECTAL ABSCESS    . JOINT REPLACEMENT Right 2006   knee  . TOTAL KNEE ARTHROPLASTY Left 08/06/2014   Procedure: LEFT TOTAL KNEE ARTHROPLASTY;  Surgeon: Gaynelle Arabian, MD;  Location: [facility];  Service: Orthopedics;  Laterality: Left;  . TUBAL LIGATION  1979     Current Outpatient Medications  Medication Sig Dispense Refill  . acetaminophen (TYLENOL) 325 MG tablet Take 2 tablets (650 mg total) by mouth every 6 (six) hours as needed for mild pain (or Fever >/= 101). 12 tablet 0  . albuterol (PROVENTIL) (2.5 MG/3ML) 0.083% nebulizer solution Take 3 mLs (2.5 mg total) by nebulization every 6 (six) hours as needed for wheezing or shortness of breath. 75 mL 12  . amLODipine (NORVASC) 10 MG tablet Take 1 tablet (  10 mg total) by mouth daily. 90 tablet 3  . carvedilol (COREG) 3.125 MG tablet Take 1 tablet (3.125 mg total) by mouth 2 (two) times daily. 60 tablet 2  . furosemide (LASIX) 80 MG tablet Take 1 tablet (80 mg total) by mouth daily. 90 tablet 1  . gabapentin (NEURONTIN) 300 MG capsule Take 300 mg by mouth at bedtime.    . insulin glargine (LANTUS SOLOSTAR) 100 UNIT/ML Solostar Pen Inject 25 Units into the skin at bedtime. 15 mL 1  . lisinopril (ZESTRIL) 20 MG tablet TAKE 1 TABLET (20 MG TOTAL) BY MOUTH DAILY. 90 tablet 2  . metFORMIN (GLUCOPHAGE) 1000 MG tablet Take 1 tablet (1,000 mg total) by mouth 2 (two) times daily. 180 tablet 2  . methocarbamol (ROBAXIN) 500 MG tablet Take 500 mg by  mouth every 6 (six) hours as needed for muscle spasms. Reported on 09/25/2015    . potassium chloride (KLOR-CON) 10 MEQ tablet TAKE 1 TABLET (10 MEQ TOTAL) BY MOUTH DAILY. 90 tablet 3  . pramipexole (MIRAPEX) 1 MG tablet Take 1 tablet (1 mg total) by mouth 2 (two) times daily. 180 tablet 0  . HYDROcodone-acetaminophen (NORCO/VICODIN) 5-325 MG tablet hydrocodone 5 mg-acetaminophen 325 mg tablet  TAKE 1 TABLET BY MOUTH TWICE DAILY AS NEEDED     No current facility-administered medications for this visit.    Allergies:   Sulfa antibiotics, Sulfonamide derivatives, and Other    ROS:  Please see the history of present illness.  All other systems are reviewed and are negative.   PHYSICAL EXAM: VS:  BP (!) 131/76   Pulse 83   Ht 5' (1.524 m)   Wt (!) 210 lb 6.4 oz (95.4 kg)   SpO2 95%   BMI 41.09 kg/m  , BMI Body mass index is 41.09 kg/m.  General: Alert, oriented x3, no distress, morbidly obese Head: no evidence of trauma, PERRL, EOMI, no exophtalmos or lid lag, no myxedema, no xanthelasma; normal ears, nose and oropharynx Neck: Very hard to identify the jugular venous pulsations or hepatojugular reflux; brisk carotid pulses without delay and no carotid bruits Chest: clear to auscultation, no signs of consolidation by percussion or palpation, normal fremitus, symmetrical and full respiratory excursions Cardiovascular: normal position and quality of the apical impulse, regular rhythm with occasional ectopic beats, normal first and second heart sounds, no murmurs, rubs or gallops Abdomen: no tenderness or distention, no masses by palpation, no abnormal pulsatility or arterial bruits, normal bowel sounds, no hepatosplenomegaly Extremities: no clubbing, cyanosis; 3+ symmetrical pretibial and ankle edema; 2+ radial, ulnar and brachial pulses bilaterally; 2+ right femoral, posterior tibial and dorsalis pedis pulses; 2+ left femoral, posterior tibial and dorsalis pedis pulses; no subclavian or  femoral bruits Neurological: grossly nonfocal Psych: Normal mood and affect  EKG:  EKG is not ordered today.  Recent Labs: 12/27/2018: ALT 21 07/08/2019: TSH 0.902 07/18/2019: Hemoglobin 12.9; Platelets 288.0 12/08/2019: BNP 264.5; BUN 19; Creatinine, Ser 0.77; Potassium 3.9; Sodium 145    Lipid Panel    Component Value Date/Time   CHOL 136 12/27/2018 1037   TRIG 113.0 12/27/2018 1037   HDL 34.30 (L) 12/27/2018 1037   CHOLHDL 4 12/27/2018 1037   VLDL 22.6 12/27/2018 1037   LDLCALC 79 12/27/2018 1037   LDLDIRECT 111.6 02/14/2010 1406      Wt Readings from Last 3 Encounters:  12/08/19 (!) 210 lb 6.4 oz (95.4 kg)  12/01/19 214 lb 6.4 oz (97.3 kg)  09/04/19 217 lb 9.6 oz (98.7 kg)  Other studies Reviewed: Echo July 08, 2019 1. Left ventricular ejection fraction, by estimation, is 60 to 65%. The  left ventricle has normal function. The left ventricle has no regional  wall motion abnormalities. There is mild to moderate left ventricular  hypertrophy. Left ventricular diastolic  parameters are consistent with Grade I diastolic dysfunction (impaired  relaxation).  2. Right ventricular systolic function is normal. The right ventricular  size is normal. Tricuspid regurgitation signal is inadequate for assessing  PA pressure.  3. The mitral valve is grossly normal, there is mild to moderate annular  calcification. Trivial mitral valve regurgitation.  4. The aortic valve was not well visualized. Mild aortic leaflet  calcification. Aortic valve regurgitation is mild.  5. IVC dilated, but not able to estimate CVP (no sniff).  Additional studies/ records that were reviewed today include: Labs. Review of the above records demonstrates:  Please see elsewhere in the note.     ASSESSMENT AND PLAN:  1. Acute on chronic diastolic CHF (congestive heart failure) ([place])   2. Essential hypertension   3. Chronic respiratory failure with hypoxia ([facility])   4. Morbid obesity ([place])    5. Type 2 diabetes mellitus with complication, with long-term current use of insulin ([place])   6. Chronic congestive heart failure with left ventricular diastolic dysfunction ([facility])   7. SOB (shortness of breath)     1. CHF: Despite doubling the dose of furosemide (she is taking 40 mg in the morning and 40 mg around 3 PM), she has had only modest diuresis of about 4 pounds there is some reduction in the edema in her legs, since there is some new wrinkling, but she still has substantial volume overload.  We will check labs today and if her renal function and potassium level are not optimal range will double the furosemide dose again.  I will call her over the weekend. 2. HTN: Well-controlled on the current medications.  Cutting back on the dose of amlodipine was not beneficial according to Dr. Percival Spanish notes. 3.  Chronic respiratory failure with hypoxia: She was supposed to be on BiPAP, but this has not been initiated yet.  Uses oxygen at night. 4.  Morbid obesity: Underlies many of her chronic conditions including the respiratory difficulties, diabetes and diastolic heart failure. 5. DM: Fair control considering her age and comorbid conditions.  Hemoglobin A1c was 7.6% earlier this year and LDL cholesterol was 79  Current medicines are reviewed at length with the patient today.  The patient does not have concerns regarding medicines.  The following changes have been made:  no change  Labs/ tests ordered today include:   Orders Placed This Encounter  Procedures  . B Nat Peptide  . Basic metabolic panel   Patient Instructions  Medication Instructions:  No Changes *If you need a refill on your cardiac medications before your next appointment, please call your pharmacy*   Lab Work: BMET today BNP today If you have labs (blood work) drawn today and your tests are completely normal, you will receive your results only by: Marland Kitchen MyChart Message (if you have MyChart) OR . A paper copy in the  mail If you have any lab test that is abnormal or we need to change your treatment, we will call you to review the results.   Testing/Procedures: None ordered   Follow-Up: At [facility], you and your health needs are our priority.  As part of our continuing mission to provide you with exceptional heart care, we have  created designated Provider Care Teams.  These Care Teams include your primary Cardiologist (physician) and Advanced Practice Providers (APPs -  Physician Assistants and Nurse Practitioners) who all work together to provide you with the care you need, when you need it.  We recommend signing up for the patient portal called "MyChart".  Sign up information is provided on this After Visit Summary.  MyChart is used to connect with patients for Virtual Visits (Telemedicine).  Patients are able to view lab/test results, encounter notes, upcoming appointments, etc.  Non-urgent messages can be sent to your provider as well.   To learn more about what you can do with MyChart, go to [email or web address].    Your next appointment:   2 week(s)  The format for your next appointment:   In Person  Provider:   You may see Minus Breeding, MD or one of the following Advanced Practice Providers on your designated Care Team:    Rosaria Ferries, PA-C  Jory Sims, DNP, ANP  Cadence Kathlen Mody, PA-C       Signed, Sanda Klein, MD  12/10/2019 12:23 PM    [place]

## 2019-12-08 NOTE — Patient Instructions (Signed)
Medication Instructions:  No Changes *If you need a refill on your cardiac medications before your next appointment, please call your pharmacy*   Lab Work: BMET today BNP today If you have labs (blood work) drawn today and your tests are completely normal, you will receive your results only by: Marland Kitchen MyChart Message (if you have MyChart) OR . A paper copy in the mail If you have any lab test that is abnormal or we need to change your treatment, we will call you to review the results.   Testing/Procedures: None ordered   Follow-Up: At [facility], you and your health needs are our priority.  As part of our continuing mission to provide you with exceptional heart care, we have created designated Provider Care Teams.  These Care Teams include your primary Cardiologist (physician) and Advanced Practice Providers (APPs -  Physician Assistants and Nurse Practitioners) who all work together to provide you with the care you need, when you need it.  We recommend signing up for the patient portal called "MyChart".  Sign up information is provided on this After Visit Summary.  MyChart is used to connect with patients for Virtual Visits (Telemedicine).  Patients are able to view lab/test results, encounter notes, upcoming appointments, etc.  Non-urgent messages can be sent to your provider as well.   To learn more about what you can do with MyChart, go to [email or web address].    Your next appointment:   2 week(s)  The format for your next appointment:   In Person  Provider:   You may see Minus Breeding, MD or one of the following Advanced Practice Providers on your designated Care Team:    Rosaria Ferries, PA-C  Jory Sims, DNP, ANP  Cadence Kathlen Mody, [place]

## 2019-12-09 LAB — BASIC METABOLIC PANEL
BUN/Creatinine Ratio: 25 (ref 12–28)
BUN: 19 mg/dL (ref 8–27)
CO2: 30 mmol/L — ABNORMAL HIGH (ref 20–29)
Calcium: 9 mg/dL (ref 8.7–10.3)
Chloride: 98 mmol/L (ref 96–106)
Creatinine, Ser: 0.77 mg/dL (ref 0.57–1.00)
GFR calc Af Amer: 84 mL/min/{1.73_m2} (ref >59)
GFR calc non Af Amer: 73 mL/min/{1.73_m2} (ref >59)
Glucose: 128 mg/dL — ABNORMAL HIGH (ref 65–99)
Potassium: 3.9 mmol/L (ref 3.5–5.2)
Sodium: 145 mmol/L — ABNORMAL HIGH (ref 134–144)

## 2019-12-09 LAB — BRAIN NATRIURETIC PEPTIDE: BNP: 264.5 pg/mL — ABNORMAL HIGH (ref 0.0–100.0)

## 2019-12-10 ENCOUNTER — Encounter: Payer: Self-pay | Admitting: Cardiovascular Disease

## 2019-12-11 ENCOUNTER — Encounter: Payer: Self-pay | Admitting: Cardiovascular Disease

## 2019-12-11 NOTE — Progress Notes (Signed)
Checked back in with [name] by phone. Diuresis has improved and edema is a little better. Breathing is unchanged. Weight has decreased a little to 209 lb. No further falls or syncope. Has a follow-up appt on 8/13.  Same instructions apply: 1. I advised her that if she passes out or falls again she should call 911 for assistance. 2. Continue furosemide to 80 mg twice daily.  3. Continue potassium chloride to 10 mEq twice daily.  4. Call our office if her systolic blood pressure is 100 or less. Call us if breathing worsens. 5. Weigh daily and check her blood pressure daily. Bring log to appt. Scheduled 8/13

## 2019-12-18 ENCOUNTER — Other Ambulatory Visit: Payer: Self-pay

## 2019-12-18 ENCOUNTER — Ambulatory Visit (INDEPENDENT_AMBULATORY_CARE_PROVIDER_SITE_OTHER): Payer: Medicare HMO

## 2019-12-18 DIAGNOSIS — M949 Disorder of cartilage, unspecified: Secondary | ICD-10-CM

## 2019-12-18 DIAGNOSIS — M899 Disorder of bone, unspecified: Secondary | ICD-10-CM

## 2019-12-18 LAB — VITAMIN D 25 HYDROXY (VIT D DEFICIENCY, FRACTURES): VITD: 22.48 ng/mL — ABNORMAL LOW (ref 30.00–100.00)

## 2019-12-21 ENCOUNTER — Other Ambulatory Visit: Payer: Self-pay | Admitting: Emergency Medicine

## 2019-12-21 DIAGNOSIS — E559 Vitamin D deficiency, unspecified: Secondary | ICD-10-CM

## 2019-12-21 MED ORDER — VITAMIN D (ERGOCALCIFEROL) 1.25 MG (50000 UNIT) PO CAPS: ORAL_CAPSULE | ORAL | 0 refills | Status: DC

## 2019-12-25 ENCOUNTER — Telehealth: Payer: Medicare HMO

## 2019-12-28 ENCOUNTER — Ambulatory Visit: Payer: Medicare HMO

## 2019-12-28 DIAGNOSIS — Z794 Long term (current) use of insulin: Secondary | ICD-10-CM

## 2019-12-28 DIAGNOSIS — I5032 Chronic diastolic (congestive) heart failure: Secondary | ICD-10-CM

## 2019-12-28 DIAGNOSIS — I10 Essential (primary) hypertension: Secondary | ICD-10-CM

## 2019-12-28 DIAGNOSIS — E1140 Type 2 diabetes mellitus with diabetic neuropathy, unspecified: Secondary | ICD-10-CM

## 2019-12-28 NOTE — Progress Notes (Signed)
Chronic Care Management Pharmacy  Name: [name]  MRN: [id number] DOB: 07/31/1940  Chief Complaint/ HPI  [name],  79 y.o. , female presents for their Follow-Up CCM visit with the clinical pharmacist via telephone due to COVID-19 Pandemic. Pt is hard of hearing.  Furosemide dose is now 80 mg twice daily and potassium 10 meq twice daily. States swelling in ankles has gotten much better, daily weights over last 3 days are 201 (8/12), 204 (8/11), 203 (8/10). Today's pulse ox 93%. Denies any falls over last week.    Has not been monitoring BP at home as recommended. Has cuff at home, says she is motivated to start monitoring and keeping a log and report SBP <100.  Over the last year and a half throughout pandemic, feels that she has been too sedentary and lost a significant amount of strength in her legs and has really started to notice it. Reports that she is completely dependent on walker. She is wondering if she could be seen by PCP for this and if she can be referred to physical therapy.  Patient previously misplaced her glucometer/testing supplies while preparing for move. States she still has supplies/monitor but has not been testing and is not able to provide FBG readings.   PCP : Brunetta Jeans, PA-C  Their chronic conditions include: Encounter Diagnoses  Name Primary?  . Type 2 diabetes mellitus with diabetic neuropathy, with long-term current use of insulin ([place]) Yes  . Chronic diastolic congestive heart failure ([place])   . Essential hypertension     Future Appointments  Date Time Provider [place]  01/16/2020 11:15 AM Deberah Pelton, NP [facility]  03/07/2020  9:30 AM [facility] PHARMACIST [facility]   Office Visits:  07/18/2019 (PCP): 14-day Hospitalization follow-up. Acute on chronic respiratory failure. Reported FBG readings at home 110-120. Patient instructions "Please make sure to restrict fluid intake to 60 ounces or less daily. Check  weight once daily (same time each day) and write this down. If you notice more than a 3 lb weight gain in 1 day or > 5 lb increase in 3 days, you need to call us or the Cardiologist" 05/04/20 (PCP): battery dead in glucometer. Low-dose melatonin 3 mg to help with sleep.  Consult Visit: 12/08/2019 (Dr. Sallyanne Kuster, Cardiology): chf, f/u in 2 weeks. Instructions: 1. I advised her that if she passes out or falls again she should call 911 for assistance. 2. Continue furosemide to 80 mg twice daily.  3. Continue potassium chloride to 10 mEq twice daily.  4. Call our office if her systolic blood pressure is 100 or less. Call us if breathing worsens. 5. Weigh daily and check her blood pressure daily.   09/04/2019 Jory Sims, Cardiology): amlodipine cut down to 7.5 mg from 10 mg due to tiredness.  07/24/2019 Jory Sims, Cardiology): BP elevated, considering coreg increase.   Patient Active Problem List   Diagnosis Date Noted  . Educated about COVID-19 virus infection 11/30/2019  . Chronic respiratory failure with hypoxia and hypercapnia ([place]) 07/11/2019  . Acute on chronic diastolic CHF (congestive heart failure) ([place]) 07/08/2019  . Hypoxia 07/07/2019  . Hypokalemia 05/03/2019  . Facial asymmetry, acquired 08/20/2016  . Fracture of knee prosthesis ([place]) 08/01/2016  . Obesity 02/20/2016  . Chronic pain syndrome 02/19/2016  . Mild episode of recurrent major depressive disorder ([place]) 08/02/2015  . Chronic diastolic congestive heart failure ([place]) 07/16/2015  . Type 2 diabetes mellitus with diabetic neuropathy ([place]) 09/27/2014  .  Hearing loss - has hearing aides, follow by audiologist 10/11/2013  . No blood products ([place])  09/11/2013  . Disorder of bone and cartilage 06/10/2010  . Breast cancer of upper-outer quadrant of right female breast ([place]) 04/04/2010  . RESTLESS LEGS SYNDROME 05/05/2007  . Obstructive sleep apnea 03/01/2007  . Essential hypertension 01/27/2007  .  Osteoarthritis 01/27/2007   Social History   Socioeconomic History  . Marital status: Married    Spouse name: Not on file  . Number of children: 3  . Years of education: Not on file  . Highest education level: Not on file  Occupational History  . Not on file  Tobacco Use  . Smoking status: Never Smoker  . Smokeless tobacco: Never Used  Substance and Sexual Activity  . Alcohol use: No  . Drug use: No  . Sexual activity: Yes  Other Topics Concern  . Not on file  Social History Narrative         Home Situation: lives with husband and granddaughter      Spiritual Beliefs: Jehovah Witness - no blood products               Social Determinants of Health   Financial Resource Strain:   . Difficulty of Paying Living Expenses:   Food Insecurity: No Food Insecurity  . Worried About Charity fundraiser in the Last Year: Never true  . Ran Out of Food in the Last Year: Never true  Transportation Needs: No Transportation Needs  . Lack of Transportation (Medical): No  . Lack of Transportation (Non-Medical): No  Physical Activity:   . Days of Exercise per Week:   . Minutes of Exercise per Session:   Stress:   . Feeling of Stress :   Social Connections:   . Frequency of Communication with Friends and Family:   . Frequency of Social Gatherings with Friends and Family:   . Attends Religious Services:   . Active Member of Clubs or Organizations:   . Attends Archivist Meetings:   Marland Kitchen Marital Status:    Family History  Problem Relation Age of Onset  . Alcohol abuse Father   . Cancer Sister        breast  . Heart disease Paternal Grandfather   . Diabetes Other   . Obesity Other   . Heart disease Brother        Valve   Allergies  Allergen Reactions  . Sulfa Antibiotics Swelling    Other reaction(s): Facial Swelling "swelling lips"   . Sulfonamide Derivatives Swelling    Lips Swelling  . Other     BLOOD PRODUCT REFUSAL   Current Meds  Medication Sig  .  acetaminophen (TYLENOL) 325 MG tablet Take 2 tablets (650 mg total) by mouth every 6 (six) hours as needed for mild pain (or Fever >/= 101).  Marland Kitchen albuterol (PROVENTIL) (2.5 MG/3ML) 0.083% nebulizer solution Take 3 mLs (2.5 mg total) by nebulization every 6 (six) hours as needed for wheezing or shortness of breath.  Marland Kitchen amLODipine (NORVASC) 10 MG tablet Take 1 tablet (10 mg total) by mouth daily.  . carvedilol (COREG) 3.125 MG tablet Take 1 tablet (3.125 mg total) by mouth 2 (two) times daily.  . furosemide (LASIX) 80 MG tablet Take 1 tablet (80 mg total) by mouth daily. (Patient taking differently: Take 80 mg by mouth 2 (two) times daily. )  . HYDROcodone-acetaminophen (NORCO/VICODIN) 5-325 MG tablet hydrocodone 5 mg-acetaminophen 325 mg tablet  TAKE 1  TABLET BY MOUTH TWICE DAILY AS NEEDED  . insulin glargine (LANTUS SOLOSTAR) 100 UNIT/ML Solostar Pen Inject 25 Units into the skin at bedtime.  Marland Kitchen lisinopril (ZESTRIL) 20 MG tablet TAKE 1 TABLET (20 MG TOTAL) BY MOUTH DAILY.  . metFORMIN (GLUCOPHAGE) 1000 MG tablet Take 1 tablet (1,000 mg total) by mouth 2 (two) times daily.  . potassium chloride (KLOR-CON) 10 MEQ tablet TAKE 1 TABLET (10 MEQ TOTAL) BY MOUTH DAILY. (Patient taking differently: Take 10 mEq by mouth 2 (two) times daily. )  . pramipexole (MIRAPEX) 1 MG tablet Take 1 tablet (1 mg total) by mouth 2 (two) times daily.  . Vitamin D, Ergocalciferol, (DRISDOL) 1.25 MG (50000 UNIT) CAPS capsule Take 1 capsule by mouth weekly for 10 weeks  Current Diagnosis/Assessment: Goals Addressed            This Visit's Progress   . PharmD Care Plan   On track    CARE PLAN ENTRY Current Barriers:  . Chronic Disease Management support, education, and care coordination needs related to Hypertension, Diabetes, Heart Failure, and RLS   Hypertension / Heart Failure . Pharmacist Clinical Goal(s): o Over the next 90 days, patient will work with PharmD and providers to maintain BP goal <140/90 . Current  regimen:   Carvedilol 3.125 mg twice daily  Furosemide 80 mg twice daily   Lisinopril 20 mg daily  Amlodipine 7.5 mg daily  Potassium cl er 10 meq twice daily . Interventions: o BP monitoring plan . Patient self care activities - Over the next 90 days, patient will: o Check BP at least once every 1-2 weeks, document, and provide at future appointments - Please have son purchase blood pressure monitor with cuff for the upper arm so we can keep an eye on this  o Ensure daily salt intake < 2300 mg/day o Check weight once daily (same time each day) and write this down. If you notice more than a 3 lb weight gain in 1 day or > 5 lb increase in 3 days, you need to our office or the Cardiologist o Restrict fluid to 60 ounces (7.5 cups) or less  Diabetes . Pharmacist Clinical Goal(s): o Over the next 90 days, patient will work with PharmD and providers to maintain A1c goal  7.5% . Current regimen:   Metformin 1000 mg twice daily   Lantus 25 units once every night  . Interventions: o Please let us know if you are needing a replacement glucometer.  . Patient self care activities - Over the next 90 days, patient will: o Check blood sugar once daily, document, and provide at future appointments o Contact provider with any episodes of hypoglycemia RLS / Pain . Pharmacist Clinical Goal(s) o Over the next 180 days, patient will work with PharmD and providers to minimize RLS / pain symptoms . Current regimen:   Pramipexole 1 mg twice daily  . Interventions: o Continue current management . Patient self care activities - Over the next 180 days, patient will: o Continue current management  Medication management . Pharmacist Clinical Goal(s): o Over the next 90 days, patient will work with PharmD and providers to achieve optimal medication adherence . Current pharmacy[facility] . Interventions o Comprehensive medication review performed. o Continue current medication management  strategy . Patient self care activities - Over the next 180 days, patient will: o Focus on medication adherence by Continue current management o Take medications as prescribed o Report any questions or concerns to PharmD and/or  provider(s) Initial goal documentation.      Hypertension / heart failure    BP Readings from Last 3 Encounters:  12/08/19 (!) 131/76  12/01/19 138/90  09/04/19 126/73   Lab Results  Component Value Date   K 3.9 12/08/2019   K 4.0 12/01/2019   K 3.6 07/18/2019   Denies dizziness, falls. Actively taking daily weights noted above. Home BP monitoring encouraged. Reviewed BP and daily weight recommendations.   Patient is currently controlled on the following medications:   Carvedilol 3.125 mg twice daily  Lisinopril 20 mg daily  Amlodipine 7.5 mg daily  Potassium cl er 10 meq twice daily   Furosemide 80 mg twice daily   Plan  Continue current medications.    Diabetes   Denies any symptoms of hypoglycemia. Declines needing a replacement glucometer.   Lab Results  Component Value Date/Time   HGBA1C 7.6 (H) 07/05/2019 02:23 PM   HGBA1C 7.7 (H) 04/10/2019 10:13 AM   MICROALBUR 6.8 (H) 02/19/2014 11:12 AM   MICROALBUR 6.4 (H) 10/11/2013 09:32 AM   06/2019 a1c at 7.6%. Patient previously reported that she has misplaced her glucometer. Today she is not able to provide readings.  Patient is currently controlled on the following medications:   Metformin 1000 mg twice daily   Lantus 25 units once every night   Plan  Continue current medications.    Osteopenia    VITD  Date Value Ref Range Status  12/18/2019 22.48 (L) 30.00 - 100.00 ng/mL Final   Last DEXA Scan: 11/2019, osteoporosis. Ensuring adequate vit d intake prior to pharmacologic mgmt. 12/2019 vit d 22.48, received rx for:  Weekly vitamin d2 50,000  Plan  Continue current management.  RLS / Pain    Denies any side effects including somnolence at this time.  Patient is  currently controlled on the following medications:   Pramipexole 1 mg twice daily   Methocarbamol 500 mg every 6 hours as needed   Plan  Continue current medications   Chronic respiratory failure   Denies PND, feels like breathing is doing well.  Patient is currently managed with:   Proventil 2.5mg /3 mL as needed  Oxygen therapy  Plan  Continue current medications.  Vaccines   Reviewed and discussed patient's vaccination history.    Immunization History  Administered Date(s) Administered  . Fluad Quad(high Dose 65+) 01/24/2019  . Influenza Split 02/20/2011, 01/25/2012  . Influenza Whole 02/16/1999, 02/25/2007, 03/07/2009, 02/14/2010  . Influenza, High Dose Seasonal PF 02/17/2016, 03/23/2017, 01/20/2018  . Influenza,inj,Quad PF,6+ Mos 02/01/2013, 02/19/2014, 01/30/2015  . Influenza,inj,quad, With Preservative 02/15/2017  . PFIZER SARS-COV-2 Vaccination 06/06/2019, 06/26/2019  . Pneumococcal Conjugate-13 01/30/2015  . Pneumococcal Polysaccharide-23 10/11/2013  . Td 05/18/2005  . Tdap 10/21/2015  . Zoster 02/20/2011   Reviewed vaccines - continues to be due for Shingrix.   Plan  Recommended patient receive Shingrix vaccine in pharmacy.   Medication Management   Receives prescription medications from:  [facility], [address] [place] [address] Phone: [phone] Fax: [phone]  Denies any issues organizing or getting her medications.   Plan  Continue current medication management strategy.  Follow up:   CPA: 01/2020 BP and DM patient call.  ______________ Visit Information  Madelin Rear, Pharm.D., BCGP Clinical Pharmacist [facility] [phone]

## 2019-12-29 ENCOUNTER — Ambulatory Visit: Payer: Medicare HMO | Admitting: Adult Health

## 2019-12-29 NOTE — Patient Instructions (Signed)
Please call me at [phone] (direct line) with any questions - thank you!  - [name]., Clinical Pharmacist  Goals Addressed            This Visit's Progress   . PharmD Care Plan   On track    CARE PLAN ENTRY Current Barriers:  . Chronic Disease Management support, education, and care coordination needs related to Hypertension, Diabetes, Heart Failure, and RLS   Hypertension / Heart Failure . Pharmacist Clinical Goal(s): o Over the next 90 days, patient will work with PharmD and providers to maintain BP goal <140/90 . Current regimen:   Carvedilol 3.125 mg twice daily  Furosemide 80 mg twice daily   Lisinopril 20 mg daily  Amlodipine 7.5 mg daily  Potassium cl er 10 meq twice daily . Interventions: o BP monitoring plan . Patient self care activities - Over the next 90 days, patient will: o Check BP at least once every 1-2 weeks, document, and provide at future appointments - Please have son purchase blood pressure monitor with cuff for the upper arm so we can keep an eye on this  o Ensure daily salt intake < 2300 mg/day o Check weight once daily (same time each day) and write this down. If you notice more than a 3 lb weight gain in 1 day or > 5 lb increase in 3 days, you need to our office or the Cardiologist o Restrict fluid to 60 ounces (7.5 cups) or less  Diabetes . Pharmacist Clinical Goal(s): o Over the next 90 days, patient will work with PharmD and providers to maintain A1c goal  7.5% . Current regimen:   Metformin 1000 mg twice daily   Lantus 25 units once every night  . Interventions: o Please let us know if you are needing a replacement glucometer.  . Patient self care activities - Over the next 90 days, patient will: o Check blood sugar once daily, document, and provide at future appointments o Contact provider with any episodes of hypoglycemia RLS / Pain . Pharmacist Clinical Goal(s) o Over the next 180 days, patient will work with PharmD and  providers to minimize RLS / pain symptoms . Current regimen:   Pramipexole 1 mg twice daily  . Interventions: o Continue current management . Patient self care activities - Over the next 180 days, patient will: o Continue current management  Medication management . Pharmacist Clinical Goal(s): o Over the next 90 days, patient will work with PharmD and providers to achieve optimal medication adherence . Current pharmacy: [facility] . Interventions o Comprehensive medication review performed. o Continue current medication management strategy . Patient self care activities - Over the next 180 days, patient will: o Focus on medication adherence by Continue current management o Take medications as prescribed o Report any questions or concerns to PharmD and/or provider(s) Initial goal documentation.      Ms. Fahr was given information about Chronic Care Management services today including:  1. CCM service includes personalized support from designated clinical staff supervised by her physician, including individualized plan of care and coordination with other care providers 2. 24/7 contact phone numbers for assistance for urgent and routine care needs. 3. Standard insurance, coinsurance, copays and deductibles apply for chronic care management only during months in which we provide at least 20 minutes of these services. Most insurances cover these services at 100%, however patients may be responsible for any copay, coinsurance and/or deductible if applicable. This service may help you avoid the need  for more expensive face-to-face services. 4. Only one practitioner may furnish and bill the service in a calendar month. 5. The patient may stop CCM services at any time (effective at the end of the month) by phone call to the office staff.  Patient agreed to services and verbal consent obtained.   The patient verbalized understanding of instructions provided today and agreed to receive a  mailed copy of patient instruction and/or educational materials. Telephone follow up appointment with pharmacy team member scheduled for: See next appointment with "Care Management Staff" under "What's Next" below.   Madelin Rear, Pharm.D., BCGP Clinical Pharmacist [phone]    Hypertension, Adult High blood pressure (hypertension) is when the force of blood pumping through the arteries is too strong. The arteries are the blood vessels that carry blood from the heart throughout the body. Hypertension forces the heart to work harder to pump blood and may cause arteries to become narrow or stiff. Untreated or uncontrolled hypertension can cause a heart attack, heart failure, a stroke, kidney disease, and other problems. A blood pressure reading consists of a higher number over a lower number. Ideally, your blood pressure should be below 120/80. The first ("top") number is called the systolic pressure. It is a measure of the pressure in your arteries as your heart beats. The second ("bottom") number is called the diastolic pressure. It is a measure of the pressure in your arteries as the heart relaxes. What are the causes? The exact cause of this condition is not known. There are some conditions that result in or are related to high blood pressure. What increases the risk? Some risk factors for high blood pressure are under your control. The following factors may make you more likely to develop this condition:  Smoking.  Having type 2 diabetes mellitus, high cholesterol, or both.  Not getting enough exercise or physical activity.  Being overweight.  Having too much fat, sugar, calories, or salt (sodium) in your diet.  Drinking too much alcohol. Some risk factors for high blood pressure may be difficult or impossible to change. Some of these factors include:  Having chronic kidney disease.  Having a family history of high blood pressure.  Age. Risk increases with age.  Race. You  may be at higher risk if you are African American.  Gender. Men are at higher risk than women before age 61. After age 76, women are at higher risk than men.  Having obstructive sleep apnea.  Stress. What are the signs or symptoms? High blood pressure may not cause symptoms. Very high blood pressure (hypertensive crisis) may cause:  Headache.  Anxiety.  Shortness of breath.  Nosebleed.  Nausea and vomiting.  Vision changes.  Severe chest pain.  Seizures. How is this diagnosed? This condition is diagnosed by measuring your blood pressure while you are seated, with your arm resting on a flat surface, your legs uncrossed, and your feet flat on the floor. The cuff of the blood pressure monitor will be placed directly against the skin of your upper arm at the level of your heart. It should be measured at least twice using the same arm. Certain conditions can cause a difference in blood pressure between your right and left arms. Certain factors can cause blood pressure readings to be lower or higher than normal for a short period of time:  When your blood pressure is higher when you are in a health care provider's office than when you are at home, this is called white  coat hypertension. Most people with this condition do not need medicines.  When your blood pressure is higher at home than when you are in a health care provider's office, this is called masked hypertension. Most people with this condition may need medicines to control blood pressure. If you have a high blood pressure reading during one visit or you have normal blood pressure with other risk factors, you may be asked to:  Return on a different day to have your blood pressure checked again.  Monitor your blood pressure at home for 1 week or longer. If you are diagnosed with hypertension, you may have other blood or imaging tests to help your health care provider understand your overall risk for other conditions. How is  this treated? This condition is treated by making healthy lifestyle changes, such as eating healthy foods, exercising more, and reducing your alcohol intake. Your health care provider may prescribe medicine if lifestyle changes are not enough to get your blood pressure under control, and if:  Your systolic blood pressure is above 130.  Your diastolic blood pressure is above 80. Your personal target blood pressure may vary depending on your medical conditions, your age, and other factors. Follow these instructions at home: Eating and drinking   Eat a diet that is high in fiber and potassium, and low in sodium, added sugar, and fat. An example eating plan is called the DASH (Dietary Approaches to Stop Hypertension) diet. To eat this way: ? Eat plenty of fresh fruits and vegetables. Try to fill one half of your plate at each meal with fruits and vegetables. ? Eat whole grains, such as whole-wheat pasta, brown rice, or whole-grain bread. Fill about one fourth of your plate with whole grains. ? Eat or drink low-fat dairy products, such as skim milk or low-fat yogurt. ? Avoid fatty cuts of meat, processed or cured meats, and poultry with skin. Fill about one fourth of your plate with lean proteins, such as fish, chicken without skin, beans, eggs, or tofu. ? Avoid pre-made and processed foods. These tend to be higher in sodium, added sugar, and fat.  Reduce your daily sodium intake. Most people with hypertension should eat less than 1,500 mg of sodium a day.  Do not drink alcohol if: ? Your health care provider tells you not to drink. ? You are pregnant, may be pregnant, or are planning to become pregnant.  If you drink alcohol: ? Limit how much you use to:  0-1 drink a day for women.  0-2 drinks a day for men. ? Be aware of how much alcohol is in your drink. In the [place] one drink equals one 12 oz bottle of beer (355 mL), one 5 oz glass of wine (148 mL), or one 1 oz glass of hard liquor (44  mL). Lifestyle   Work with your health care provider to maintain a healthy body weight or to lose weight. Ask what an ideal weight is for you.  Get at least 30 minutes of exercise most days of the week. Activities may include walking, swimming, or biking.  Include exercise to strengthen your muscles (resistance exercise), such as Pilates or lifting weights, as part of your weekly exercise routine. Try to do these types of exercises for 30 minutes at least 3 days a week.  Do not use any products that contain nicotine or tobacco, such as cigarettes, e-cigarettes, and chewing tobacco. If you need help quitting, ask your health care provider.  Monitor your blood pressure  at home as told by your health care provider.  Keep all follow-up visits as told by your health care provider. This is important. Medicines  Take over-the-counter and prescription medicines only as told by your health care provider. Follow directions carefully. Blood pressure medicines must be taken as prescribed.  Do not skip doses of blood pressure medicine. Doing this puts you at risk for problems and can make the medicine less effective.  Ask your health care provider about side effects or reactions to medicines that you should watch for. Contact a health care provider if you:  Think you are having a reaction to a medicine you are taking.  Have headaches that keep coming back (recurring).  Feel dizzy.  Have swelling in your ankles.  Have trouble with your vision. Get help right away if you:  Develop a severe headache or confusion.  Have unusual weakness or numbness.  Feel faint.  Have severe pain in your chest or abdomen.  Vomit repeatedly.  Have trouble breathing. Summary  Hypertension is when the force of blood pumping through your arteries is too strong. If this condition is not controlled, it may put you at risk for serious complications.  Your personal target blood pressure may vary depending on  your medical conditions, your age, and other factors. For most people, a normal blood pressure is less than 120/80.  Hypertension is treated with lifestyle changes, medicines, or a combination of both. Lifestyle changes include losing weight, eating a healthy, low-sodium diet, exercising more, and limiting alcohol. This information is not intended to replace advice given to you by your health care provider. Make sure you discuss any questions you have with your health care provider. Document Revised: 01/12/2018 Document Reviewed: 01/12/2018 Elsevier Patient Education  2020 [employer].

## 2019-12-31 DIAGNOSIS — I5032 Chronic diastolic (congestive) heart failure: Secondary | ICD-10-CM | POA: Diagnosis not present

## 2019-12-31 DIAGNOSIS — R062 Wheezing: Secondary | ICD-10-CM | POA: Diagnosis not present

## 2019-12-31 DIAGNOSIS — G4733 Obstructive sleep apnea (adult) (pediatric): Secondary | ICD-10-CM | POA: Diagnosis not present

## 2020-01-02 ENCOUNTER — Telehealth (INDEPENDENT_AMBULATORY_CARE_PROVIDER_SITE_OTHER): Payer: Medicare HMO | Admitting: Physician Assistant

## 2020-01-02 ENCOUNTER — Encounter: Payer: Self-pay | Admitting: Physician Assistant

## 2020-01-02 DIAGNOSIS — Z794 Long term (current) use of insulin: Secondary | ICD-10-CM

## 2020-01-02 DIAGNOSIS — I10 Essential (primary) hypertension: Secondary | ICD-10-CM

## 2020-01-02 DIAGNOSIS — E1140 Type 2 diabetes mellitus with diabetic neuropathy, unspecified: Secondary | ICD-10-CM | POA: Diagnosis not present

## 2020-01-02 NOTE — Progress Notes (Signed)
Virtual Visit via Video   I connected with patient on 01/02/20 at  2:30 PM EDT by a video enabled telemedicine application and verified that I am speaking with the correct person using two identifiers.  Location patient: Home Location provider: [facility], Office Persons participating in the virtual visit: Patient, Provider, [place] (Patina Moore)  I discussed the limitations of evaluation and management by telemedicine and the availability of in person appointments. The patient expressed understanding and agreed to proceed.  Subjective:   HPI:   Patient presents via [place] today for follow-up of hypertension and DM II.  Hypertension -- Patient is currently on a regimen of Lisinopril 20 mg daily, Amlodipine 10 mg daily and Carvedilol 3.125 mg BID. Endorses taking all medication as directed and tolerating well. Patient denies chest pain, palpitations, lightheadedness, dizziness, vision changes or frequent headaches. Notes stable weight. Denies swelling of legs, PND or orthopnea. Follows with Cardiology as scheduled.   DM II -- with diabetic neuropathy. Patient is currently on a regimen of Metformin 1000 mg BID and Lantus 25 units nightly. Endorses taking as directed. Is trying to keep a well-balanced, low carb diet. Is checking glucose and notes fasting glucose averaging in low 100s. Has eye exam scheduled. Vaccines are up-to-date.     ROS:   See pertinent positives and negatives per HPI.  Patient Active Problem List   Diagnosis Date Noted  . Educated about COVID-19 virus infection 11/30/2019  . Chronic respiratory failure with hypoxia and hypercapnia ([place]) 07/11/2019  . Acute on chronic diastolic CHF (congestive heart failure) ([place]) 07/08/2019  . Hypoxia 07/07/2019  . Hypokalemia 05/03/2019  . Facial asymmetry, acquired 08/20/2016  . Fracture of knee prosthesis ([place]) 08/01/2016  . Obesity 02/20/2016  . Chronic pain syndrome 02/19/2016  . Mild episode of recurrent  major depressive disorder ([place]) 08/02/2015  . Chronic diastolic congestive heart failure ([place]) 07/16/2015  . Type 2 diabetes mellitus with diabetic neuropathy ([place]) 09/27/2014  . Hearing loss - has hearing aides, follow by audiologist 10/11/2013  . No blood products ([place])  09/11/2013  . Disorder of bone and cartilage 06/10/2010  . Breast cancer of upper-outer quadrant of right female breast ([place]) 04/04/2010  . RESTLESS LEGS SYNDROME 05/05/2007  . Obstructive sleep apnea 03/01/2007  . Essential hypertension 01/27/2007  . Osteoarthritis 01/27/2007    Social History   Tobacco Use  . Smoking status: Never Smoker  . Smokeless tobacco: Never Used  Substance Use Topics  . Alcohol use: No    Current Outpatient Medications:  .  acetaminophen (TYLENOL) 325 MG tablet, Take 2 tablets (650 mg total) by mouth every 6 (six) hours as needed for mild pain (or Fever >/= 101)., Disp: 12 tablet, Rfl: 0 .  albuterol (PROVENTIL) (2.5 MG/3ML) 0.083% nebulizer solution, Take 3 mLs (2.5 mg total) by nebulization every 6 (six) hours as needed for wheezing or shortness of breath., Disp: 75 mL, Rfl: 12 .  amLODipine (NORVASC) 10 MG tablet, Take 1 tablet (10 mg total) by mouth daily., Disp: 90 tablet, Rfl: 3 .  carvedilol (COREG) 3.125 MG tablet, Take 1 tablet (3.125 mg total) by mouth 2 (two) times daily., Disp: 60 tablet, Rfl: 2 .  furosemide (LASIX) 80 MG tablet, Take 1 tablet (80 mg total) by mouth daily. (Patient taking differently: Take 80 mg by mouth 2 (two) times daily. ), Disp: 90 tablet, Rfl: 1 .  gabapentin (NEURONTIN) 300 MG capsule, Take 300 mg by mouth at bedtime., Disp: , Rfl:  .  HYDROcodone-acetaminophen (NORCO/VICODIN) 5-325 MG tablet, hydrocodone 5 mg-acetaminophen 325 mg tablet  TAKE 1 TABLET BY MOUTH TWICE DAILY AS NEEDED, Disp: , Rfl:  .  insulin glargine (LANTUS SOLOSTAR) 100 UNIT/ML Solostar Pen, Inject 25 Units into the skin at bedtime., Disp: 15 mL, Rfl: 1 .  lisinopril  (ZESTRIL) 20 MG tablet, TAKE 1 TABLET (20 MG TOTAL) BY MOUTH DAILY., Disp: 90 tablet, Rfl: 2 .  metFORMIN (GLUCOPHAGE) 1000 MG tablet, Take 1 tablet (1,000 mg total) by mouth 2 (two) times daily., Disp: 180 tablet, Rfl: 2 .  potassium chloride (KLOR-CON) 10 MEQ tablet, TAKE 1 TABLET (10 MEQ TOTAL) BY MOUTH DAILY. (Patient taking differently: Take 10 mEq by mouth 2 (two) times daily. ), Disp: 90 tablet, Rfl: 3 .  pramipexole (MIRAPEX) 1 MG tablet, Take 1 tablet (1 mg total) by mouth 2 (two) times daily., Disp: 180 tablet, Rfl: 0 .  Vitamin D, Ergocalciferol, (DRISDOL) 1.25 MG (50000 UNIT) CAPS capsule, Take 1 capsule by mouth weekly for 10 weeks, Disp: 10 capsule, Rfl: 0  Allergies  Allergen Reactions  . Sulfa Antibiotics Swelling    Other reaction(s): Facial Swelling "swelling lips"   . Sulfonamide Derivatives Swelling    Lips Swelling  . Other     BLOOD PRODUCT REFUSAL    Objective:   There were no vitals taken for this visit.  Patient is well-developed, well-nourished in no acute distress.  Resting comfortably at home.  Head is normocephalic, atraumatic.  No labored breathing.  Speech is clear and coherent with logical content.  Patient is alert and oriented at baseline.   Assessment and Plan:   1. Type 2 diabetes mellitus with diabetic neuropathy, with long-term current use of insulin ([place]) Lab appointment scheduled for fasting labs to recheck CMP and A1C. Will adjust regimen accordingly. She is to have her eye doctor send [place] noted after her visit.  - Comprehensive metabolic panel; Future - Hemoglobin A1c; Future  2. Essential hypertension Encouraged her to check BP at home and record. Continue current regimen. Order for future labs placed. Will have CMA check BP at time of visit for review.  - Comprehensive metabolic panel; Future    Leeanne Rio, PA-C 01/02/2020

## 2020-01-02 NOTE — Progress Notes (Signed)
I have discussed the procedure for the virtual visit with the patient who has given consent to proceed with assessment and treatment.   Taji Barretto S Quincie Haroon, CMA     

## 2020-01-03 ENCOUNTER — Ambulatory Visit (INDEPENDENT_AMBULATORY_CARE_PROVIDER_SITE_OTHER): Payer: Medicare HMO

## 2020-01-03 DIAGNOSIS — I10 Essential (primary) hypertension: Secondary | ICD-10-CM

## 2020-01-03 DIAGNOSIS — E559 Vitamin D deficiency, unspecified: Secondary | ICD-10-CM | POA: Diagnosis not present

## 2020-01-03 DIAGNOSIS — E1140 Type 2 diabetes mellitus with diabetic neuropathy, unspecified: Secondary | ICD-10-CM | POA: Diagnosis not present

## 2020-01-03 DIAGNOSIS — Z794 Long term (current) use of insulin: Secondary | ICD-10-CM

## 2020-01-03 LAB — COMPREHENSIVE METABOLIC PANEL
ALT: 16 U/L (ref 0–35)
AST: 23 U/L (ref 0–37)
Albumin: 3.9 g/dL (ref 3.5–5.2)
Alkaline Phosphatase: 88 U/L (ref 39–117)
BUN: 18 mg/dL (ref 6–23)
CO2: 31 mEq/L (ref 19–32)
Calcium: 9.3 mg/dL (ref 8.4–10.5)
Chloride: 99 mEq/L (ref 96–112)
Creatinine, Ser: 0.78 mg/dL (ref 0.40–1.20)
GFR: 70.92 mL/min (ref >60.00)
Glucose, Bld: 132 mg/dL — ABNORMAL HIGH (ref 70–99)
Potassium: 3.2 mEq/L — ABNORMAL LOW (ref 3.5–5.1)
Sodium: 141 mEq/L (ref 135–145)
Total Bilirubin: 0.6 mg/dL (ref 0.2–1.2)
Total Protein: 6.9 g/dL (ref 6.0–8.3)

## 2020-01-03 LAB — HEMOGLOBIN A1C: Hgb A1c MFr Bld: 6.8 % — ABNORMAL HIGH (ref 4.6–6.5)

## 2020-01-03 LAB — VITAMIN D 25 HYDROXY (VIT D DEFICIENCY, FRACTURES): VITD: 32.73 ng/mL (ref 30.00–100.00)

## 2020-01-09 ENCOUNTER — Other Ambulatory Visit: Payer: Self-pay

## 2020-01-09 MED ORDER — POTASSIUM CHLORIDE ER 10 MEQ PO TBCR: EXTENDED_RELEASE_TABLET | ORAL | 3 refills | Status: DC

## 2020-01-13 ENCOUNTER — Other Ambulatory Visit: Payer: Self-pay | Admitting: Physician Assistant

## 2020-01-13 DIAGNOSIS — I10 Essential (primary) hypertension: Secondary | ICD-10-CM

## 2020-01-15 NOTE — Progress Notes (Deleted)
Cardiology Clinic Note   Patient Name: [name] Date of Encounter: 01/15/2020  Primary Care Provider:  Brunetta Jeans, PA-C Primary Cardiologist:  Minus Breeding, MD  Patient Profile    [name]. Fath 79 year old female presents the clinic today for follow-up evaluation of her essential hypertension and a chronic diastolic CHF.  Past Medical History    Past Medical History:  Diagnosis Date   AK (actinic keratosis)    Anxiety    occasional   Breast cancer (HCC)    R breast, s/p radiation 34Gy/53fx 04/29/10-05/05/10   CTS (carpal tunnel syndrome)    Left   Diabetes (HCC)    Diastolic CHF ([place])    Dx w/ hospitalization 06/2015 for respiratory failure   Facial asymmetry, acquired 08/20/2016   From injury   Falls frequently    "can not pick left leg up"   Hearing loss    bilateral hearing aides   History of environmental allergies    History of kidney stones 20 years ago   Hypertension    MDD (major depressive disorder)    No blood products ([employer])  09/11/2013   Osteoarthritis    hx shoulder, knee, back, wrist pain; saw Dr. Wynelle Link    Personal history of radiation therapy    RESTLESS LEGS SYNDROME 05/05/2007   Qualifier: Diagnosis of  By: Gwenette Greet MD, Armando Reichert    RLS (restless legs syndrome)    Sleep apnea    Past Surgical History:  Procedure Laterality Date   APPENDECTOMY  age 59   BREAST LUMPECTOMY Right 2012   BREAST SURGERY  2012   rt breast lumpectomy   flex signoidoscopy     INCISION AND DRAINAGE PERIRECTAL ABSCESS     JOINT REPLACEMENT Right 2006   knee   TOTAL KNEE ARTHROPLASTY Left 08/06/2014   Procedure: LEFT TOTAL KNEE ARTHROPLASTY;  Surgeon: Gaynelle Arabian, MD;  Location: [facility];  Service: Orthopedics;  Laterality: Left;   TUBAL LIGATION  1979    Allergies  Allergies  Allergen Reactions   Sulfa Antibiotics Swelling    Other reaction(s): Facial Swelling "swelling lips"    Sulfonamide Derivatives  Swelling    Lips Swelling   Other     BLOOD PRODUCT REFUSAL    History of Present Illness    [name] has a PMH of essential hypertension, chronic diastolic CHF, OSA, hypoxia, chronic respiratory failure, type 2 diabetes, hearing loss, OA, obesity, hypokalemia, and chronic pain syndrome.  She was last seen in the clinic by Dr. Sallyanne Kuster on 12/08/2019.  During that time her diuresis had been increased and she had some improvement with her lower extremity edema.  She indicated that she had lost around 4 pounds.  However, she continued to feel somewhat ill.  She continued to sleep either in a chair or in the elevated position.  She denied recent falls but indicated that she occasionally fell asleep and would fall.  She denied angina, palpitations, and syncope.  She denied claudication and neurological complaints.  Dr. Loletha Grayer check back in on [name] 12/11/2019.  She indicated that her lower extremity swelling continue to somewhat improved.  Her breathing was unchanged.  Her weight at that time was 209 pounds.  She denied syncope or falls.  She had follow-up scheduled for 8/13.  However she did not present for evaluation.  She presents the clinic today for follow-up and states***  *** denies chest pain, shortness of breath, lower extremity edema, fatigue, palpitations, melena, hematuria, hemoptysis,  diaphoresis, weakness, presyncope, syncope, orthopnea, and PND.  Home Medications    Prior to Admission medications   Medication Sig Start Date End Date Taking? Authorizing Provider  acetaminophen (TYLENOL) 325 MG tablet Take 2 tablets (650 mg total) by mouth every 6 (six) hours as needed for mild pain (or Fever >/= 101). 07/11/19   Emokpae, Courage, MD  albuterol (PROVENTIL) (2.5 MG/3ML) 0.083% nebulizer solution Take 3 mLs (2.5 mg total) by nebulization every 6 (six) hours as needed for wheezing or shortness of breath. 07/11/19   Roxan Hockey, MD  amLODipine (NORVASC) 10 MG tablet Take 1 tablet (10 mg  total) by mouth daily. 12/08/19   Minus Breeding, MD  carvedilol (COREG) 3.125 MG tablet Take 1 tablet (3.125 mg total) by mouth 2 (two) times daily. 07/11/19   Roxan Hockey, MD  furosemide (LASIX) 80 MG tablet Take 1 tablet (80 mg total) by mouth daily. Patient taking differently: Take 80 mg by mouth 2 (two) times daily.  12/01/19   Minus Breeding, MD  gabapentin (NEURONTIN) 300 MG capsule Take 300 mg by mouth at bedtime. Patient not taking: Reported on 01/02/2020    [provider]  insulin glargine (LANTUS SOLOSTAR) 100 UNIT/ML Solostar Pen Inject 25 Units into the skin at bedtime. 10/26/19   Brunetta Jeans, PA-C  lisinopril (ZESTRIL) 20 MG tablet TAKE 1 TABLET (20 MG TOTAL) BY MOUTH DAILY. 08/24/19   Brunetta Jeans, PA-C  metFORMIN (GLUCOPHAGE) 1000 MG tablet Take 1 tablet (1,000 mg total) by mouth 2 (two) times daily. 07/11/19   Roxan Hockey, MD  potassium chloride (KLOR-CON) 10 MEQ tablet Take 2 tabs in the morning and 1 tab at night. 01/09/20   Brunetta Jeans, PA-C  pramipexole (MIRAPEX) 1 MG tablet TAKE 1 TABLET TWICE DAILY 01/15/20   Brunetta Jeans, PA-C  Vitamin D, Ergocalciferol, (DRISDOL) 1.25 MG (50000 UNIT) CAPS capsule Take 1 capsule by mouth weekly for 10 weeks 12/21/19   Brunetta Jeans, PA-C    Family History    Family History  Problem Relation Age of Onset   Alcohol abuse Father    Cancer Sister        breast   Heart disease Paternal Grandfather    Diabetes Other    Obesity Other    Heart disease Brother        Valve   She indicated that her mother is deceased. She indicated that her father is deceased. She indicated that the status of her sister is unknown. She indicated that the status of her brother is unknown. She indicated that the status of her paternal grandfather is unknown. She indicated that the status of her other is unknown.  Social History    Social History   Socioeconomic History   Marital status: Married    Spouse name:  Not on file   Number of children: 3   Years of education: Not on file   Highest education level: Not on file  Occupational History   Not on file  Tobacco Use   Smoking status: Never Smoker   Smokeless tobacco: Never Used  Substance and Sexual Activity   Alcohol use: No   Drug use: No   Sexual activity: Yes  Other Topics Concern   Not on file  Social History Narrative         Home Situation: lives with husband and granddaughter      Spiritual Beliefs: Jehovah Witness - no blood products  Social Determinants of Health   Financial Resource Strain:    Difficulty of Paying Living Expenses: Not on file  Food Insecurity: No Food Insecurity   Worried About Charity fundraiser in the Last Year: Never true   Ran Out of Food in the Last Year: Never true  Transportation Needs: No Transportation Needs   Lack of Transportation (Medical): No   Lack of Transportation (Non-Medical): No  Physical Activity:    Days of Exercise per Week: Not on file   Minutes of Exercise per Session: Not on file  Stress:    Feeling of Stress : Not on file  Social Connections:    Frequency of Communication with Friends and Family: Not on file   Frequency of Social Gatherings with Friends and Family: Not on file   Attends Religious Services: Not on file   Active Member of Clubs or Organizations: Not on file   Attends Archivist Meetings: Not on file   Marital Status: Not on file  Intimate Partner Violence:    Fear of Current or Ex-Partner: Not on file   Emotionally Abused: Not on file   Physically Abused: Not on file   Sexually Abused: Not on file     Review of Systems    General:  No chills, fever, night sweats or weight changes.  Cardiovascular:  No chest pain, dyspnea on exertion, edema, orthopnea, palpitations, paroxysmal nocturnal dyspnea. Dermatological: No rash, lesions/masses Respiratory: No cough, dyspnea Urologic: No hematuria,  dysuria Abdominal:   No nausea, vomiting, diarrhea, bright red blood per rectum, melena, or hematemesis Neurologic:  No visual changes, wkns, changes in mental status. All other systems reviewed and are otherwise negative except as noted above.  Physical Exam    VS:  There were no vitals taken for this visit. , BMI There is no height or weight on file to calculate BMI. GEN: Well nourished, well developed, in no acute distress. HEENT: normal. Neck: Supple, no JVD, carotid bruits, or masses. Cardiac: RRR, no murmurs, rubs, or gallops. No clubbing, cyanosis, edema.  Radials/DP/PT 2+ and equal bilaterally.  Respiratory:  Respirations regular and unlabored, clear to auscultation bilaterally. GI: Soft, nontender, nondistended, BS + x 4. MS: no deformity or atrophy. Skin: warm and dry, no rash. Neuro:  Strength and sensation are intact. Psych: Normal affect.  Accessory Clinical Findings    Recent Labs: 07/08/2019: TSH 0.902 07/18/2019: Hemoglobin 12.9; Platelets 288.0 12/08/2019: BNP 264.5 01/03/2020: ALT 16; BUN 18; Creatinine, Ser 0.78; Potassium 3.2; Sodium 141   Recent Lipid Panel    Component Value Date/Time   CHOL 136 12/27/2018 1037   TRIG 113.0 12/27/2018 1037   HDL 34.30 (L) 12/27/2018 1037   CHOLHDL 4 12/27/2018 1037   VLDL 22.6 12/27/2018 1037   LDLCALC 79 12/27/2018 1037   LDLDIRECT 111.6 02/14/2010 1406    ECG personally reviewed by me today- *** - No acute changes  Echocardiogram 07/08/2019 IMPRESSIONS    1. Left ventricular ejection fraction, by estimation, is 60 to 65%. The  left ventricle has normal function. The left ventricle has no regional  wall motion abnormalities. There is mild to moderate left ventricular  hypertrophy. Left ventricular diastolic  parameters are consistent with Grade I diastolic dysfunction (impaired  relaxation).  2. Right ventricular systolic function is normal. The right ventricular  size is normal. Tricuspid regurgitation signal is  inadequate for assessing  PA pressure.  3. The mitral valve is grossly normal, there is mild to moderate annular  calcification.  Trivial mitral valve regurgitation.  4. The aortic valve was not well visualized. Mild aortic leaflet  calcification. Aortic valve regurgitation is mild.  5. IVC dilated, but not able to estimate CVP (no sniff).  Assessment & Plan   1.  Chronic diastolic CHF-euvolemic today.  Weight today***.  No increased DOE or activity intolerance.  Echocardiogram 07/08/2019 showed LVEF of 60-65% G1 DD, trivial mitral valve regurgitation, mild aortic leaflet calcification, mild aortic valve regurgitation, and dilated IVC. Continue furosemide, carvedilol, lisinopril, potassium Heart healthy low-sodium diet-salty 6 given Increase physical activity as tolerated Order BMP  Essential hypertension-BP today***.  Well-controlled at home. Continue carvedilol, lisinopril, amlodipine, furosemide Heart healthy low-sodium diet-salty 6 given Increase physical activity as tolerated  Morbid obesity-weight today***. Continue physical activity Continue heart healthy low-sodium diet Continue weight loss  Chronic respiratory failure with hypoxia-currently still using oxygen at night.  Continues to defer BiPAP. Recommended transition to BiPAP and explained benefits of continued oxygenation.  Diabetes mellitus-A1c 6.8 on 01/03/2020. Continue Lantus, Metformin Heart healthy low-sodium carb modified diet Increase physical activity Followed by PCP  Disposition: Follow-up with Dr. Percival Spanish in 3 months.  Jossie Ng. Yuvia Plant NP-C    01/15/2020, 1:57 PM [facility] [place] [address] Office [phone] Fax [phone]  Notice: This dictation was prepared with Dragon dictation along with smaller phrase technology. Any transcriptional errors that result from this process are unintentional and may not be corrected upon review.

## 2020-01-16 ENCOUNTER — Ambulatory Visit: Payer: Medicare HMO | Admitting: General Practice

## 2020-01-26 ENCOUNTER — Telehealth: Payer: Self-pay | Admitting: Pharmacist Clinician (PhC)/ Clinical Pharmacy Specialist

## 2020-01-26 MED ORDER — FUROSEMIDE 80 MG PO TABS
80.0000 mg | ORAL_TABLET | Freq: Two times a day (BID) | ORAL | 6 refills | Status: DC
Start: 2020-01-26 — End: 2020-07-16

## 2020-01-26 NOTE — Telephone Encounter (Signed)
Daughter calling, concerned about patient and furosemide.  Patient has rx for 80 mg qd, but most recently was asked to increase to bid in cardiology.  Daughter states that pt was told by another practice to take tid.  She is somewhat confused and now out of medications x 2-3 days.  Was getting thru mail order.    Reviewed chart, last note from Dr. Sallyanne Kuster indicates she should be taking bid.  Told daughter I can't confirm/deny anything about tid dose, but will update rx to bid and send to [facility].  Daughter concerned because label instructions don't change with each adjustment.  Explained the difficulty of this with medications like furosemide, that change regularly.    [facility] sent to [facility] for 30 day supply with BID dosing, encouraged daughter to have patient call, as she missed her last appointment with PA (came at wrong time of day, was unable to be seen).

## 2020-01-31 DIAGNOSIS — R062 Wheezing: Secondary | ICD-10-CM | POA: Diagnosis not present

## 2020-01-31 DIAGNOSIS — I5032 Chronic diastolic (congestive) heart failure: Secondary | ICD-10-CM | POA: Diagnosis not present

## 2020-01-31 DIAGNOSIS — G4733 Obstructive sleep apnea (adult) (pediatric): Secondary | ICD-10-CM | POA: Diagnosis not present

## 2020-02-03 ENCOUNTER — Other Ambulatory Visit: Payer: Self-pay | Admitting: Physician Assistant

## 2020-02-06 ENCOUNTER — Ambulatory Visit (INDEPENDENT_AMBULATORY_CARE_PROVIDER_SITE_OTHER): Payer: Medicare HMO

## 2020-02-06 ENCOUNTER — Other Ambulatory Visit: Payer: Self-pay

## 2020-02-06 DIAGNOSIS — E876 Hypokalemia: Secondary | ICD-10-CM

## 2020-02-06 LAB — BASIC METABOLIC PANEL
BUN: 17 mg/dL (ref 6–23)
CO2: 33 mEq/L — ABNORMAL HIGH (ref 19–32)
Calcium: 8.8 mg/dL (ref 8.4–10.5)
Chloride: 98 mEq/L (ref 96–112)
Creatinine, Ser: 1.05 mg/dL (ref 0.40–1.20)
GFR: 50.32 mL/min — ABNORMAL LOW (ref >60.00)
Glucose, Bld: 105 mg/dL — ABNORMAL HIGH (ref 70–99)
Potassium: 3.1 mEq/L — ABNORMAL LOW (ref 3.5–5.1)
Sodium: 140 mEq/L (ref 135–145)

## 2020-02-07 ENCOUNTER — Other Ambulatory Visit: Payer: Self-pay | Admitting: General Practice

## 2020-02-07 DIAGNOSIS — E876 Hypokalemia: Secondary | ICD-10-CM

## 2020-02-11 ENCOUNTER — Other Ambulatory Visit: Payer: Self-pay | Admitting: Physician Assistant

## 2020-02-11 DIAGNOSIS — E1140 Type 2 diabetes mellitus with diabetic neuropathy, unspecified: Secondary | ICD-10-CM

## 2020-02-11 DIAGNOSIS — Z794 Long term (current) use of insulin: Secondary | ICD-10-CM

## 2020-02-18 ENCOUNTER — Other Ambulatory Visit: Payer: Self-pay | Admitting: Physician Assistant

## 2020-02-18 DIAGNOSIS — Z794 Long term (current) use of insulin: Secondary | ICD-10-CM

## 2020-02-18 DIAGNOSIS — E1140 Type 2 diabetes mellitus with diabetic neuropathy, unspecified: Secondary | ICD-10-CM

## 2020-02-28 ENCOUNTER — Ambulatory Visit (INDEPENDENT_AMBULATORY_CARE_PROVIDER_SITE_OTHER): Payer: Medicare HMO

## 2020-02-28 ENCOUNTER — Other Ambulatory Visit: Payer: Self-pay

## 2020-02-28 DIAGNOSIS — E876 Hypokalemia: Secondary | ICD-10-CM | POA: Diagnosis not present

## 2020-02-28 LAB — BASIC METABOLIC PANEL
BUN: 17 mg/dL (ref 6–23)
CO2: 31 mEq/L (ref 19–32)
Calcium: 9.1 mg/dL (ref 8.4–10.5)
Chloride: 100 mEq/L (ref 96–112)
Creatinine, Ser: 0.72 mg/dL (ref 0.40–1.20)
GFR: 78.57 mL/min (ref >60.00)
Glucose, Bld: 84 mg/dL (ref 70–99)
Potassium: 3.6 mEq/L (ref 3.5–5.1)
Sodium: 140 mEq/L (ref 135–145)

## 2020-03-01 ENCOUNTER — Other Ambulatory Visit: Payer: Self-pay | Admitting: Physician Assistant

## 2020-03-01 DIAGNOSIS — E559 Vitamin D deficiency, unspecified: Secondary | ICD-10-CM

## 2020-03-01 DIAGNOSIS — R062 Wheezing: Secondary | ICD-10-CM | POA: Diagnosis not present

## 2020-03-01 DIAGNOSIS — I5032 Chronic diastolic (congestive) heart failure: Secondary | ICD-10-CM | POA: Diagnosis not present

## 2020-03-01 DIAGNOSIS — G4733 Obstructive sleep apnea (adult) (pediatric): Secondary | ICD-10-CM | POA: Diagnosis not present

## 2020-03-07 ENCOUNTER — Ambulatory Visit: Payer: Medicare HMO

## 2020-03-07 DIAGNOSIS — Z794 Long term (current) use of insulin: Secondary | ICD-10-CM

## 2020-03-07 DIAGNOSIS — I10 Essential (primary) hypertension: Secondary | ICD-10-CM

## 2020-03-07 DIAGNOSIS — E1140 Type 2 diabetes mellitus with diabetic neuropathy, unspecified: Secondary | ICD-10-CM

## 2020-03-07 NOTE — Progress Notes (Signed)
Chronic Care Management Pharmacy  Name: [name]  MRN: [id number] DOB: 10/30/1940  Chief Complaint/ HPI  [name],  79 y.o. , female presents for their Follow-Up CCM visit with the clinical pharmacist via telephone due to COVID-19 Pandemic. Pt is hard of hearing.  PCP : Brunetta Jeans, PA-C  Their chronic conditions include: Encounter Diagnoses  Name Primary?  . Essential hypertension Yes  . Type 2 diabetes mellitus with diabetic neuropathy, with long-term current use of insulin [facility])    Future Appointments  Date Time Provider [place]  04/19/2020  9:00 AM [facility] PHARMACIST [facility]  04/25/2020 11:40 AM Minus Breeding, MD [facility]  07/10/2020 10:00 AM Brunetta Jeans, PA-C [facility]   Office Visits:  02/28/2020 (Dr Birdie Riddle - Lab): Previously advised patient to increase potassium to 50 meq/day. Multiple dose increases over past 2-3 months. Within range on this date.  12/19/2019 (PCP): once vitamin D within range, wanting to start patient on medication for osteoporosis. 07/18/2019 (PCP): 14-day Hospitalization follow-up. Acute on chronic respiratory failure. Reported FBG readings at home 110-120. Patient instructions "Please make sure to restrict fluid intake to 60 ounces or less daily. Check weight once daily (same time each day) and write this down. If you notice more than a 3 lb weight gain in 1 day or > 5 lb increase in 3 days, you need to call us or the Cardiologist"  Consult Visit: 12/08/2019 (Dr. Sallyanne Kuster, Cardiology): chf, f/u in 2 weeks. Instructions: 1. I advised her that if she passes out or falls again she should call 911 for assistance. 2. Continue furosemide to 80 mg twice daily.  3. Continue potassium chloride to 10 mEq twice daily.  4. Call our office if her systolic blood pressure is 100 or less. Call us if breathing worsens. 5. Weigh daily and check her blood pressure daily.  09/04/2019 Jory Sims, Cardiology):  amlodipine cut down to 7.5 mg from 10 mg due to tiredness.  07/24/2019 Jory Sims, Cardiology): BP elevated, considering coreg increase.   Patient Active Problem List   Diagnosis Date Noted  . Educated about COVID-19 virus infection 11/30/2019  . Chronic respiratory failure with hypoxia and hypercapnia ([place]) 07/11/2019  . Acute on chronic diastolic CHF (congestive heart failure) ([place]) 07/08/2019  . Hypoxia 07/07/2019  . Hypokalemia 05/03/2019  . Facial asymmetry, acquired 08/20/2016  . Fracture of knee prosthesis ([place]) 08/01/2016  . Obesity 02/20/2016  . Chronic pain syndrome 02/19/2016  . Mild episode of recurrent major depressive disorder ([place]) 08/02/2015  . Chronic diastolic congestive heart failure ([place]) 07/16/2015  . Type 2 diabetes mellitus with diabetic neuropathy ([place]) 09/27/2014  . Hearing loss - has hearing aides, follow by audiologist 10/11/2013  . No blood products ([place])  09/11/2013  . Disorder of bone and cartilage 06/10/2010  . Breast cancer of upper-outer quadrant of right female breast ([place]) 04/04/2010  . RESTLESS LEGS SYNDROME 05/05/2007  . Obstructive sleep apnea 03/01/2007  . Essential hypertension 01/27/2007  . Osteoarthritis 01/27/2007   Social History   Socioeconomic History  . Marital status: Married    Spouse name: Not on file  . Number of children: 3  . Years of education: Not on file  . Highest education level: Not on file  Occupational History  . Not on file  Tobacco Use  . Smoking status: Never Smoker  . Smokeless tobacco: Never Used  Substance and Sexual Activity  . Alcohol use: No  . Drug use: No  .  Sexual activity: Yes  Other Topics Concern  . Not on file  Social History Narrative         Home Situation: lives with husband and granddaughter      Spiritual Beliefs: Jehovah Witness - no blood products               Social Determinants of Health   Financial Resource Strain:   . Difficulty of Paying Living  Expenses: Not on file  Food Insecurity: No Food Insecurity  . Worried About Charity fundraiser in the Last Year: Never true  . Ran Out of Food in the Last Year: Never true  Transportation Needs: No Transportation Needs  . Lack of Transportation (Medical): No  . Lack of Transportation (Non-Medical): No  Physical Activity:   . Days of Exercise per Week: Not on file  . Minutes of Exercise per Session: Not on file  Stress:   . Feeling of Stress : Not on file  Social Connections:   . Frequency of Communication with Friends and Family: Not on file  . Frequency of Social Gatherings with Friends and Family: Not on file  . Attends Religious Services: Not on file  . Active Member of Clubs or Organizations: Not on file  . Attends Archivist Meetings: Not on file  . Marital Status: Not on file   Family History  Problem Relation Age of Onset  . Alcohol abuse Father   . Cancer Sister        breast  . Heart disease Paternal Grandfather   . Diabetes Other   . Obesity Other   . Heart disease Brother        Valve   Allergies  Allergen Reactions  . Sulfa Antibiotics Swelling    Other reaction(s): Facial Swelling "swelling lips"   . Sulfonamide Derivatives Swelling    Lips Swelling  . Other     BLOOD PRODUCT REFUSAL    Current Outpatient Medications:  .  acetaminophen (TYLENOL) 325 MG tablet, Take 2 tablets (650 mg total) by mouth every 6 (six) hours as needed for mild pain (or Fever >/= 101)., Disp: 12 tablet, Rfl: 0 .  albuterol (PROVENTIL) (2.5 MG/3ML) 0.083% nebulizer solution, Take 3 mLs (2.5 mg total) by nebulization every 6 (six) hours as needed for wheezing or shortness of breath., Disp: 75 mL, Rfl: 12 .  amLODipine (NORVASC) 10 MG tablet, Take 1 tablet (10 mg total) by mouth daily., Disp: 90 tablet, Rfl: 3 .  carvedilol (COREG) 3.125 MG tablet, TAKE 1 TABLET TWICE DAILY WITH MEALS, Disp: 60 tablet, Rfl: 1 .  furosemide (LASIX) 80 MG tablet, Take 1 tablet (80 mg total)  by mouth 2 (two) times daily., Disp: 60 tablet, Rfl: 6 .  gabapentin (NEURONTIN) 300 MG capsule, Take 300 mg by mouth at bedtime. (Patient not taking: Reported on 01/02/2020), Disp: , Rfl:  .  LANTUS SOLOSTAR 100 UNIT/ML Solostar Pen, INJECT 25 UNITS INTO THE SKIN AT BEDTIME., Disp: 30 mL, Rfl: 2 .  lisinopril (ZESTRIL) 20 MG tablet, TAKE 1 TABLET (20 MG TOTAL) BY MOUTH DAILY., Disp: 90 tablet, Rfl: 2 .  metFORMIN (GLUCOPHAGE) 1000 MG tablet, TAKE 1 TABLET TWICE DAILY, Disp: 180 tablet, Rfl: 1 .  potassium chloride (KLOR-CON) 10 MEQ tablet, Take 2 tabs in the morning and 1 tab at night., Disp: 90 tablet, Rfl: 3 .  pramipexole (MIRAPEX) 1 MG tablet, TAKE 1 TABLET TWICE DAILY, Disp: 180 tablet, Rfl: 0 .  Vitamin D, Ergocalciferol, (DRISDOL)  1.25 MG (50000 UNIT) CAPS capsule, Take 1 capsule by mouth weekly for 10 weeks, Disp: 10 capsule, Rfl: 0  Current Diagnosis/Assessment: Goals Addressed            This Visit's Progress   . PharmD Care Plan   On track    CARE PLAN ENTRY Current Barriers:  . Chronic Disease Management support, education, and care coordination needs related to Hypertension, Diabetes, Heart Failure, and RLS   Hypertension / Heart Failure . Pharmacist Clinical Goal(s): o Over the next 90 days, patient will work with PharmD and providers to maintain BP goal <140/90 . Current regimen:   Carvedilol 3.125 mg twice daily  Lisinopril 20 mg daily  Amlodipine 7.5 mg daily  Potassium cl er 10 meq tablet - take 3 tablets in the morning and 2 tablets in the evening (50 meq/day) (9/21 lab)  Furosemide 80 mg twice daily  . Interventions: o BP monitoring plan . Patient self care activities - Over the next 90 days, patient will: o Check BP at least once every 1-2 weeks, document, and provide at future appointments - Please have son purchase blood pressure monitor with cuff for the upper arm so we can keep an eye on this  o Ensure daily salt intake < 2300 mg/day o Check weight  once daily (same time each day) and write this down. If you notice more than a 3 lb weight gain in 1 day or > 5 lb increase in 3 days, you need to our office or the Cardiologist o Restrict fluid to 60 ounces (7.5 cups) or less  Diabetes . Pharmacist Clinical Goal(s): o Over the next 90 days, patient will work with PharmD and providers to maintain A1c goal  7.5% . Current regimen:   Metformin 1000 mg twice daily   Lantus 25 units once every night  . Interventions: o Please let us know if you are needing a replacement glucometer.  . Patient self care activities - Over the next 90 days, patient will: o Check blood sugar once daily, document, and provide at future appointments o Contact provider with any episodes of hypoglycemia RLS / Pain . Pharmacist Clinical Goal(s) o Over the next 180 days, patient will work with PharmD and providers to minimize RLS / pain symptoms . Current regimen:   Pramipexole 1 mg twice daily  . Interventions: o Continue current management . Patient self care activities - Over the next 180 days, patient will: o Continue current management  Medication management . Pharmacist Clinical Goal(s): o Over the next 90 days, patient will work with PharmD and providers to achieve optimal medication adherence . Current pharmacy: [facility] Mail order . Interventions o Comprehensive medication review performed. o Continue current medication management strategy . Patient self care activities - Over the next 180 days, patient will: o Focus on medication adherence by Continue current management o Take medications as prescribed o Report any questions or concerns to PharmD and/or provider(s) Initial goal documentation.      Hypertension / heart failure    BP Readings from Last 3 Encounters:  12/08/19 (!) 131/76  12/01/19 138/90  09/04/19 126/73   Lab Results  Component Value Date   K 3.6 02/28/2020   K 3.1 (L) 02/06/2020   K 3.2 (L) 01/03/2020    Denies  dizziness, falls. Reports actively taking daily weights however not able to provide any at this time. Patient has not been able to locate BP cuff following move so has not been checking. Reports  fluid being controlled if she remembers to take furosemide 80 mg twice daily. Frequently forgetting morning doses, especially when caring for husband.  Patient is currently controlled on the following medications:   Carvedilol 3.125 mg twice daily  Lisinopril 20 mg daily  Amlodipine 7.5 mg daily  Potassium cl er 10 meq tablet - take 3 tablets in the morning and 2 tablets in the evening (50 meq/day) (9/21 lab)  Furosemide 80 mg twice daily   Reviewed strategies for remember to take morning dose of furosemide. Reviewed and discussed recommendations from cardiology - daily weights / home BP monitoring per cardiology. Plan  Continue current medications.    Diabetes   Goal a1c <7.5%  Lab Results  Component Value Date/Time   HGBA1C 6.8 (H) 01/03/2020 11:44 AM   HGBA1C 7.6 (H) 07/05/2019 02:23 PM   MICROALBUR 6.8 (H) 02/19/2014 11:12 AM   MICROALBUR 6.4 (H) 10/11/2013 09:32 AM   A1c improved to 6.8% 12/2019 from previous 7.6%. 07/05/2019. Patient has located glucometer following recent move. No home BG readings provided. Encouraged to start testing and to keep log, report readings in 70s or less. Did not report any symptoms of hypoglycemia. Patient is currently at goal on the following medications:   Metformin 1000 mg twice daily   Lantus 25 units once every night   Plan  Continue current medications.    Osteoporosis   Last DEXA Scan: 11/2019 -  osteoporosis.   VITD  Date Value Ref Range Status  01/03/2020 32.73 30.00 - 100.00 ng/mL Final   Vitamin d within normal range.  Goal to get pt on osteoporosis therapy, was not able to discuss w/ pt during our 03/07/2020 visit.  Ensuring adequate vit d intake prior to pharmacologic mgmt. Currently taking :  Weekly vitamin d2  50,000  Plan  Review Rx options for OP with patient at 2 month f/u visit.  Continue current management.  Vaccines   Reviewed and discussed patient's vaccination history.    Immunization History  Administered Date(s) Administered  . Fluad Quad(high Dose 65+) 01/24/2019  . Influenza Split 02/20/2011, 01/25/2012  . Influenza Whole 02/16/1999, 02/25/2007, 03/07/2009, 02/14/2010  . Influenza, High Dose Seasonal PF 02/17/2016, 03/23/2017, 01/20/2018  . Influenza,inj,Quad PF,6+ Mos 02/01/2013, 02/19/2014, 01/30/2015  . Influenza,inj,quad, With Preservative 02/15/2017  . PFIZER SARS-COV-2 Vaccination 06/06/2019, 06/26/2019  . Pneumococcal Conjugate-13 01/30/2015  . Pneumococcal Polysaccharide-23 10/11/2013  . Td 05/18/2005  . Tdap 10/21/2015  . Zoster 02/20/2011   Due for shingrix, based on record would be due for fluad quad, pfizer coivd booster. This was reviewed but not discussed with patient during 03/07/2020 visit.   Plan  Recommended patient receive Shingrix vaccine in pharmacy.   Medication Management / Care Coordination   Receives prescription medications from:  [facility] [address] [place] [address] Phone: [phone] Fax: [phone]  Denies any issues organizing or getting her medications.    Plan  Continue current medication management strategy.  Follow up:   Future Appointments  Date Time Provider [place]  04/19/2020  9:00 AM [facility] PHARMACIST [facility]  04/25/2020 11:40 AM Minus Breeding, MD [facility]  07/10/2020 10:00 AM Brunetta Jeans, PA-C [facility]   CPA: 1 week check-in call on DM readings  RPH: January f/u telephone visit. Review Rx options for OP, daily weights, home BG, home BP.  Due for f/u with cardiology - coordinated scheduling with [facility] ______________ Visit Information  Madelin Rear, Pharm.D., BCGP Clinical  Pharmacist [facility] [phone]

## 2020-03-07 NOTE — Patient Instructions (Addendum)
Hello [name], thank you for taking the time to talk with me today. I have included a few handouts in case this could be helpful for tracking some of the home monitoring daily weights (heart failure), blood glucose (diabetes), and blood pressure.   Please review care plan below and call me at [phone] with any questions!  Thank you, Edyth Gunnels., Clinical Pharmacist  Goals Addressed            This Visit's Progress   . PharmD Care Plan   On track    CARE PLAN ENTRY Current Barriers:  . Chronic Disease Management support, education, and care coordination needs related to Hypertension, Diabetes, Heart Failure, and RLS   Hypertension / Heart Failure . Pharmacist Clinical Goal(s): o Over the next 90 days, patient will work with PharmD and providers to maintain BP goal <140/90 . Current regimen:   Carvedilol 3.125 mg twice daily  Furosemide 80 mg twice daily   Lisinopril 20 mg daily  Amlodipine 7.5 mg daily  Potassium cl er 10 meq twice daily . Interventions: o BP monitoring plan . Patient self care activities - Over the next 90 days, patient will: o Check BP at least once every 1-2 weeks, document, and provide at future appointments - Please have son purchase blood pressure monitor with cuff for the upper arm so we can keep an eye on this  o Ensure daily salt intake < 2300 mg/day o Check weight once daily (same time each day) and write this down. If you notice more than a 3 lb weight gain in 1 day or > 5 lb increase in 3 days, you need to our office or the Cardiologist o Restrict fluid to 60 ounces (7.5 cups) or less  Diabetes . Pharmacist Clinical Goal(s): o Over the next 90 days, patient will work with PharmD and providers to maintain A1c goal  7.5% . Current regimen:   Metformin 1000 mg twice daily   Lantus 25 units once every night  . Interventions: o Please let us know if you are needing a replacement glucometer.  . Patient self care activities - Over the next 90  days, patient will: o Check blood sugar once daily, document, and provide at future appointments o Contact provider with any episodes of hypoglycemia RLS / Pain . Pharmacist Clinical Goal(s) o Over the next 180 days, patient will work with PharmD and providers to minimize RLS / pain symptoms . Current regimen:   Pramipexole 1 mg twice daily  . Interventions: o Continue current management . Patient self care activities - Over the next 180 days, patient will: o Continue current management  Medication management . Pharmacist Clinical Goal(s): o Over the next 90 days, patient will work with PharmD and providers to achieve optimal medication adherence . Current pharmacy: [facility] . Interventions o Comprehensive medication review performed. o Continue current medication management strategy . Patient self care activities - Over the next 180 days, patient will: o Focus on medication adherence by Continue current management o Take medications as prescribed o Report any questions or concerns to PharmD and/or provider(s) Initial goal documentation.      The patient verbalized understanding of instructions provided today and agreed to receive a mailed copy of patient instruction and/or educational materials.  Madelin Rear, Pharm.D., BCGP Clinical Pharmacist [facility] [phone]   Blood Pressure Record Sheet To take your blood pressure, you will need a blood pressure machine. You can buy a blood pressure machine (blood pressure  monitor) at your clinic, drug store, or online. When choosing one, consider:  An automatic monitor that has an arm cuff.  A cuff that wraps snugly around your upper arm. You should be able to fit only one finger between your arm and the cuff.  A device that stores blood pressure reading results.  Do not choose a monitor that measures your blood pressure from your wrist or finger. Follow your health care provider's instructions for how  to take your blood pressure. To use this form:  Get one reading in the morning (a.m.) before you take any medicines.  Get one reading in the evening (p.m.) before supper.  Take at least 2 readings with each blood pressure check. This makes sure the results are correct. Wait 1-2 minutes between measurements.  Write down the results in the spaces on this form.  Repeat this once a week, or as told by your health care provider.  Make a follow-up appointment with your health care provider to discuss the results. Blood pressure log Date: _______________________  a.m. _____________________(1st reading) _____________________(2nd reading)  p.m. _____________________(1st reading) _____________________(2nd reading) Date: _______________________  a.m. _____________________(1st reading) _____________________(2nd reading)  p.m. _____________________(1st reading) _____________________(2nd reading) Date: _______________________  a.m. _____________________(1st reading) _____________________(2nd reading)  p.m. _____________________(1st reading) _____________________(2nd reading) Date: _______________________  a.m. _____________________(1st reading) _____________________(2nd reading)  p.m. _____________________(1st reading) _____________________(2nd reading) Date: _______________________  a.m. _____________________(1st reading) _____________________(2nd reading)  p.m. _____________________(1st reading) _____________________(2nd reading) This information is not intended to replace advice given to you by your health care provider. Make sure you discuss any questions you have with your health care provider. Document Revised: 07/02/2017 Document Reviewed: 05/04/2017 Elsevier Patient Education  [place].  Daily Weight Record It is important to weigh yourself daily. To do this:  Make sure you use a reliable scale. Use the same scale each day.  Keep this daily weight chart near your  scale.  Weigh yourself each morning at the same time.  Before weighing yourself: ? Take off your shoes. ? Make sure you are wearing the same amount of clothing each day.  Write down your weight in the spaces on the form.  Compare today's weight to yesterday's weight.  Bring this form with you to your follow-up visits with your   health care provider.      Call your health care provider if you have concerns about your weight, including rapid weight gain or loss. Date: ________ Weight: ____________________ Date: ________ Weight: ____________________ Date: ________ Weight: ____________________ Date: ________ Weight: ____________________ Date: ________ Weight: ____________________ Date: ________ Weight: ____________________ Date: ________ Weight: ____________________ Date: ________ Weight: ____________________ Date: ________ Weight: ____________________ Date: ________ Weight: ____________________ Date: ________ Weight: ____________________ Date: ________ Weight: ____________________ Date: ________ Weight: ____________________ Date: ________ Weight: ____________________ Date: ________ Weight: ____________________ Date: ________ Weight: ____________________ Date: ________ Weight: ____________________ Date: ________ Weight: ____________________ Date: ________ Weight: ____________________ Date: ________ Weight: ____________________ Date: ________ Weight: ____________________ Date: ________ Weight: ____________________ Date: ________ Weight: ____________________ Date: ________ Weight: ____________________ Date: ________ Weight: ____________________ Date: ________ Weight: ____________________ Date: ________ Weight: ____________________ Date: ________ Weight: ____________________ Date: ________ Weight: ____________________ Date: ________ Weight: ____________________ Date: ________ Weight: ____________________ Date: ________ Weight: ____________________ Date: ________ Weight:  ____________________ Date: ________ Weight: ____________________ Date: ________ Weight: ____________________ Date: ________ Weight: ____________________ Date: ________ Weight: ____________________ Date: ________ Weight: ____________________ Date: ________ Weight: ____________________ Date: ________ Weight: ____________________ Date: ________ Weight: ____________________ Date: ________ Weight: ____________________ Date: ________ Weight: ____________________ Date: ________ Weight: ____________________ Date: ________ Weight: ____________________ Date: ________ Weight:  ____________________ Date: ________ Weight: ____________________ Date: ________ Weight: ____________________ Date: ________ Weight: ____________________ Date: ________ Weight: ____________________ This information is not intended to replace advice given to you by your health care provider. Make sure you discuss any questions you have with your health care provider. Document Revised: 05/03/2017 Document Reviewed: 05/03/2017 Elsevier Patient Education  2020 [place]. Daily Diabetes Record Check your blood glucose (BG) as directed by your health care provider. Use this form to record your BG results as well as any diabetes medicines that you take, including insulin. Bringing a record of your BG results and a list of your current medicines to your health care provider is very helpful in managing your diabetes. These numbers help your health care provider to know whether your diabetes management plan needs to be changed. Patient name: ____________________________________ Week of ____________________ Daily BG results and diabetes medicines Date: _________  Breakfast - BG / Medicines: ________________ / __________________________________________________________  Lunch - BG / Medicines: ___________________ / __________________________________________________________  Dinner - BG / Medicines: __________________ /  __________________________________________________________  Bedtime - BG / Medicines: ________________ / ___________________________________________________________ Date: _________  Breakfast - BG / Medicines: ________________ / __________________________________________________________  Lunch - BG / Medicines: ___________________ / __________________________________________________________  Dinner - BG / Medicines: __________________ / __________________________________________________________  Bedtime - BG / Medicines: ________________ / ___________________________________________________________ Date: _________  Breakfast - BG / Medicines: ________________ / __________________________________________________________  Lunch - BG / Medicines: ___________________ / __________________________________________________________  Dinner - BG / Medicines: __________________ / __________________________________________________________  Bedtime - BG / Medicines: ________________ / ___________________________________________________________ Date: _________  Breakfast - BG / Medicines: ________________ / __________________________________________________________  Lunch - BG / Medicines: ___________________ / __________________________________________________________  Dinner - BG / Medicines: __________________ / __________________________________________________________  Bedtime - BG / Medicines: ________________ / ___________________________________________________________ Date: _________  Breakfast - BG / Medicines: ________________ / __________________________________________________________  Lunch - BG / Medicines: ___________________ / __________________________________________________________  Dinner - BG / Medicines: __________________ / __________________________________________________________  Bedtime - BG / Medicines: ________________ /  ___________________________________________________________ Date: _________  Breakfast - BG / Medicines: ________________ / __________________________________________________________  Lunch - BG / Medicines: ___________________ / __________________________________________________________  Dinner - BG / Medicines: __________________ / __________________________________________________________  Bedtime - BG / Medicines: ________________ / ___________________________________________________________ Date: _________  Breakfast - BG / Medicines: ________________ / __________________________________________________________  Lunch - BG / Medicines: ___________________ / __________________________________________________________  Dinner - BG / Medicines: __________________ / __________________________________________________________  Bedtime - BG / Medicines: ________________ / ___________________________________________________________ Notes: ______________________________________________________________________________________________________________________ This information is not intended to replace advice given to you by your health care provider. Make sure you discuss any questions you have with your health care provider. Document Revised: 02/15/2018 Document Reviewed: 01/31/2016 Elsevier Patient Education  [place].

## 2020-03-21 ENCOUNTER — Other Ambulatory Visit: Payer: Self-pay | Admitting: Physician Assistant

## 2020-03-22 ENCOUNTER — Other Ambulatory Visit: Payer: Self-pay | Admitting: Physician Assistant

## 2020-03-28 ENCOUNTER — Other Ambulatory Visit: Payer: Self-pay | Admitting: Physician Assistant

## 2020-03-28 DIAGNOSIS — I10 Essential (primary) hypertension: Secondary | ICD-10-CM

## 2020-04-01 DIAGNOSIS — G4733 Obstructive sleep apnea (adult) (pediatric): Secondary | ICD-10-CM | POA: Diagnosis not present

## 2020-04-01 DIAGNOSIS — R062 Wheezing: Secondary | ICD-10-CM | POA: Diagnosis not present

## 2020-04-01 DIAGNOSIS — I5032 Chronic diastolic (congestive) heart failure: Secondary | ICD-10-CM | POA: Diagnosis not present

## 2020-04-05 ENCOUNTER — Telehealth: Payer: Self-pay

## 2020-04-05 NOTE — Progress Notes (Signed)
I have attempted without success to contact this patient by phone three times to do her Diabetes Disease State call. I left a Voice message for patient to return my call.  [place] Pharmacist Assistant [phone]

## 2020-04-17 ENCOUNTER — Other Ambulatory Visit: Payer: Self-pay | Admitting: Physician Assistant

## 2020-04-17 DIAGNOSIS — E1159 Type 2 diabetes mellitus with other circulatory complications: Secondary | ICD-10-CM

## 2020-04-17 DIAGNOSIS — I152 Hypertension secondary to endocrine disorders: Secondary | ICD-10-CM

## 2020-04-19 ENCOUNTER — Ambulatory Visit: Payer: Medicare HMO

## 2020-04-19 NOTE — Progress Notes (Signed)
  Chronic Care Management   Outreach Note   Name: TAYLA PANOZZO MRN: [id number] DOB: 1941-04-30  Referred by: Brunetta Jeans, PA-C Reason for referral: Telephone Appointment with [place] Pharmacist, Madelin Rear.   Telephone appointment with clinical pharmacist today (04/19/2020) at [facility] with pt - wanting to rs for next week, prefers afternoon calls.   Madelin Rear, Pharm.D., BCGP Clinical Pharmacist  Primary Care [phone]

## 2020-04-22 ENCOUNTER — Telehealth: Payer: Self-pay

## 2020-04-22 DIAGNOSIS — E119 Type 2 diabetes mellitus without complications: Secondary | ICD-10-CM | POA: Diagnosis not present

## 2020-04-22 NOTE — Progress Notes (Signed)
Error

## 2020-04-24 NOTE — Progress Notes (Signed)
Cardiology Office Note   Date:  04/25/2020   ID:  [name], DOB 1940-08-06, MRN [id number]  PCP:  Brunetta Jeans, PA-C  Cardiologist:   Minus Breeding, MD   Chief Complaint  Patient presents with  . Leg Swelling      History of Present Illness: [name] is a 79 y.o. female who presents for evaluation of chronic diastolic dysfunction.  She has had trouble with increasing swelling.  Since she saw me she actually had to be worked in to see Dr. Sallyanne Cunningham because of increased swelling.  This was in July.  She has no new cardiovascular complaints.  She is very hard of hearing.  Her husband is with her but equally hard of hearing.  She gets around with a walker.  She is not describing new PND or orthopnea.  She did have nighttime respiratory difficulty but got BiPAP and says this is working well.  She is not had any presyncope or syncope.  She is not telling me that she has had any recent falls but she does have balance problems and walks with a walker.  She seems to have a limited medical understanding.  Today she is back in atrial fibrillation which is new.  She does not notice her heart racing or skipping.    Past Medical History:  Diagnosis Date  . AK (actinic keratosis)   . Anxiety    occasional  . Breast cancer ([place])    R breast, s/p radiation 34Gy/67fx 04/29/10-05/05/10  . CTS (carpal tunnel syndrome)    Left  . Diabetes ([place])   . Diastolic CHF ([place])    Dx w/ hospitalization 06/2015 for respiratory failure  . Facial asymmetry, acquired 08/20/2016   From injury  . Falls frequently    "can not pick left leg up"  . Hearing loss    bilateral hearing aides  . History of environmental allergies   . History of kidney stones 20 years ago  . Hypertension   . MDD (major depressive disorder)   . No blood products ([place])  09/11/2013  . Osteoarthritis    hx shoulder, knee, back, wrist pain; saw Dr. Wynelle Link   . Personal history of radiation therapy   .  RESTLESS LEGS SYNDROME 05/05/2007   Qualifier: Diagnosis of  By: Gwenette Greet MD, Armando Reichert   . RLS (restless legs syndrome)   . Sleep apnea     Past Surgical History:  Procedure Laterality Date  . APPENDECTOMY  age 60  . BREAST LUMPECTOMY Right 2012  . BREAST SURGERY  2012   rt breast lumpectomy  . flex signoidoscopy    . INCISION AND DRAINAGE PERIRECTAL ABSCESS    . JOINT REPLACEMENT Right 2006   knee  . TOTAL KNEE ARTHROPLASTY Left 08/06/2014   Procedure: LEFT TOTAL KNEE ARTHROPLASTY;  Surgeon: Gaynelle Arabian, MD;  Location: [facility];  Service: Orthopedics;  Laterality: Left;  . TUBAL LIGATION  1979     Current Outpatient Medications  Medication Sig Dispense Refill  . acetaminophen (TYLENOL) 325 MG tablet Take 2 tablets (650 mg total) by mouth every 6 (six) hours as needed for mild pain (or Fever >/= 101). 12 tablet 0  . albuterol (PROVENTIL) (2.5 MG/3ML) 0.083% nebulizer solution Take 3 mLs (2.5 mg total) by nebulization every 6 (six) hours as needed for wheezing or shortness of breath. 75 mL 12  . amLODipine (NORVASC) 10 MG tablet Take 1 tablet (10 mg total) by mouth daily. 90 tablet  3  . carvedilol (COREG) 3.125 MG tablet TAKE 1 TABLET TWICE DAILY WITH MEALS 180 tablet 1  . DROPLET PEN NEEDLES 32G X 4 MM MISC USE AS DIRECTED ONE TIME DAILY 100 each 1  . furosemide (LASIX) 80 MG tablet Take 1 tablet (80 mg total) by mouth 2 (two) times daily. 60 tablet 6  . LANTUS SOLOSTAR 100 UNIT/ML Solostar Pen INJECT 25 UNITS INTO THE SKIN AT BEDTIME. 30 mL 2  . lisinopril (ZESTRIL) 20 MG tablet TAKE 1 TABLET (20 MG TOTAL) BY MOUTH DAILY. 90 tablet 2  . metFORMIN (GLUCOPHAGE) 1000 MG tablet TAKE 1 TABLET TWICE DAILY 180 tablet 1  . potassium chloride (KLOR-CON) 10 MEQ tablet TAKE 2 TABS IN THE MORNING AND 1 TAB AT NIGHT. 270 tablet 0  . pramipexole (MIRAPEX) 1 MG tablet TAKE 1 TABLET TWICE DAILY 180 tablet 1  . apixaban (ELIQUIS) 5 MG TABS tablet Take 1 tablet (5 mg total) by mouth 2 (two) times  daily. 60 tablet 11   No current facility-administered medications for this visit.    Allergies:   Sulfa antibiotics, Sulfonamide derivatives, and Other    ROS:  Please see the history of present illness.   Otherwise, review of systems are positive for none.   All other systems are reviewed and negative.    PHYSICAL EXAM: VS:  BP (!) 148/84   Pulse 82   Ht 5' (1.524 m)   Wt 205 lb (93 kg)   BMI 40.04 kg/m  , BMI Body mass index is 40.04 kg/m. GENERAL:  Well appearing HEENT:  Pupils equal round and reactive, fundi not visualized, oral mucosa unremarkable NECK:  Positive jugular venous distention, waveform within normal limits, carotid upstroke brisk and symmetric, no bruits, no thyromegaly LYMPHATICS:  No cervical, inguinal adenopathy LUNGS:  Clear to auscultation bilaterally BACK:  No CVA tenderness CHEST:  Unremarkable HEART:  PMI not displaced or sustained,S1 and S2 within normal limits, no S3, no clicks, no rubs, no murmurs, irregular  ABD:  Flat, positive bowel sounds normal in frequency in pitch, no bruits, no rebound, no guarding, no midline pulsatile mass, no hepatomegaly, no splenomegaly EXT:  2 plus pulses throughout, moderately severe edema, no cyanosis no clubbing SKIN:  No rashes no nodules NEURO:  Cranial nerves II through XII grossly intact, motor grossly intact throughout PSYCH:  Cognitively intact, oriented to person place and time    EKG:  EKG is ordered today. The ekg ordered today demonstrates atrial fibrillation, rate 82, poor anterior R wave progression, low voltage in the limb leads, left axis deviation.   Recent Labs: 07/08/2019: TSH 0.902 07/18/2019: Hemoglobin 12.9; Platelets 288.0 12/08/2019: BNP 264.5 01/03/2020: ALT 16 02/28/2020: BUN 17; Creatinine, Ser 0.72; Potassium 3.6; Sodium 140    Lipid Panel    Component Value Date/Time   CHOL 136 12/27/2018 1037   TRIG 113.0 12/27/2018 1037   HDL 34.30 (L) 12/27/2018 1037   CHOLHDL 4 12/27/2018  1037   VLDL 22.6 12/27/2018 1037   LDLCALC 79 12/27/2018 1037   LDLDIRECT 111.6 02/14/2010 1406      Wt Readings from Last 3 Encounters:  04/25/20 205 lb (93 kg)  12/08/19 (!) 210 lb 6.4 oz (95.4 kg)  12/01/19 214 lb 6.4 oz (97.3 kg)     Echo:  06/2019  1. Left ventricular ejection fraction, by estimation, is 60 to 65%. The  left ventricle has normal function. The left ventricle has no regional  wall motion abnormalities. There is mild to  moderate left ventricular  hypertrophy. Left ventricular diastolic  parameters are consistent with Grade I diastolic dysfunction (impaired  relaxation).  2. Right ventricular systolic function is normal. The right ventricular  size is normal. Tricuspid regurgitation signal is inadequate for assessing  PA pressure.  3. The mitral valve is grossly normal, there is mild to moderate annular  calcification. Trivial mitral valve regurgitation.  4. The aortic valve was not well visualized. Mild aortic leaflet  calcification. Aortic valve regurgitation is mild.  5. IVC dilated, but not able to estimate CVP (no sniff).   Other studies Reviewed: Additional studies/ records that were reviewed today include: Labs. Review of the above records demonstrates:  Please see elsewhere in the note.     ASSESSMENT AND PLAN:  Chronic diastolic HF:   She seems to have chronic diastolic dysfunction.  This is a very significant social situation has she does have kids that live locally but they are not apparently caregivers.  The daughter who is a caregiver is a Press photographer who lives in [place].  I actually called her today and had a long conversation.  The patient is post be on 80 mg of Lasix twice a day.  I am not sure that she is doing this.  She needs to keep her feet elevated.  She obviously needs salt and fluid and she needs to have that restricted.  Her daughter is coming home in a few days and will look into all of this.  HTN: Her blood pressure is reasonably well  controlled.  No change in therapy.  O2 Dependent Respiratory Failure:   She is on BiPAP.    ATRIAL FIBRILLATION:   This is a new diagnosis.  I explained this to her.  I then called her daughter and spoke to her about this quite a while.  I am going to send a 3-day monitor to make sure that she has reasonable rate control and to see if it is persistent or chronic.  After much consideration I think she should be on 5 mg twice a day of Eliquis.  I will check a CBC.  I do not think that she is falling currently but her daughter is going to check on this.  There is no evidence of bleeding or high bleeding risk.  Ms. LEDORA DELKER has a CHA2DS2 - VASc score of 4.    Her daughter is going to instruct me if she thinks she is at high fall risk and we might need to just stop this.  She is not to be taking any aspirin products.  Current medicines are reviewed at length with the patient today.  The patient does not have concerns regarding medicines.  The following changes have been made:  no change  Labs/ tests ordered today include:   Orders Placed This Encounter  Procedures  . CBC  . LONG TERM MONITOR (3-14 DAYS)  . EKG 12-Lead     Disposition:   FU with APP in one month.     Signed, Minus Breeding, MD  04/25/2020 1:33 PM    [facility]

## 2020-04-25 ENCOUNTER — Encounter: Payer: Self-pay | Admitting: Cardiology

## 2020-04-25 ENCOUNTER — Other Ambulatory Visit: Payer: Self-pay

## 2020-04-25 ENCOUNTER — Ambulatory Visit: Payer: Medicare HMO | Admitting: Cardiology

## 2020-04-25 ENCOUNTER — Encounter: Payer: Self-pay | Admitting: *Deleted

## 2020-04-25 VITALS — BP 148/84 | HR 82 | Ht 60.0 in | Wt 205.0 lb

## 2020-04-25 DIAGNOSIS — I5032 Chronic diastolic (congestive) heart failure: Secondary | ICD-10-CM

## 2020-04-25 DIAGNOSIS — I10 Essential (primary) hypertension: Secondary | ICD-10-CM

## 2020-04-25 DIAGNOSIS — R002 Palpitations: Secondary | ICD-10-CM | POA: Diagnosis not present

## 2020-04-25 MED ORDER — APIXABAN 5 MG PO TABS: 5.0000 mg | ORAL_TABLET | Freq: Two times a day (BID) | ORAL | 11 refills | Status: DC

## 2020-04-25 NOTE — Patient Instructions (Signed)
Medication Instructions:  Start Eliquis 5mg , take 1 tablet by mouth twice a day *If you need a refill on your cardiac medications before your next appointment, please call your pharmacy*  Lab Work: Your physician recommends that you return for lab work today: (CBC) If you have labs (blood work) drawn today and your tests are completely normal, you will receive your results only by: Marland Kitchen MyChart Message (if you have MyChart) OR . A paper copy in the mail If you have any lab test that is abnormal or we need to change your treatment, we will call you to review the results.  Testing/Procedures: Your physician has recommended that you wear an event monitor. Event monitors are medical devices that record the heart's electrical activity. Doctors most often [place] these monitors to diagnose arrhythmias. Arrhythmias are problems with the speed or rhythm of the heartbeat. The monitor is a small, portable device. You can wear one while you do your normal daily activities. This is usually used to diagnose what is causing palpitations/syncope (passing out). CALL [phone] TO HAVE MONITOR PLACED ON SKIN  Follow-Up: At [facility], you and your health needs are our priority.  As part of our continuing mission to provide you with exceptional heart care, we have created designated Provider Care Teams.  These Care Teams include your primary Cardiologist (physician) and Advanced Practice Providers (APPs -  Physician Assistants and Nurse Practitioners) who all work together to provide you with the care you need, when you need it.  Your next appointment:   1 month(s)  The format for your next appointment:   In Person  Provider:   Any available APP   ZIO XT- Long Term Monitor Instructions   Your physician has requested you wear your ZIO patch monitor_3_days.   This is a single patch monitor.  [employer] supplies one patch monitor per enrollment.  Additional stickers are not available.   Please do not apply  patch if you will be having a Nuclear Stress Test, Echocardiogram, Cardiac CT, MRI, or Chest Xray during the time frame you would be wearing the monitor. The patch cannot be worn during these tests.  You cannot remove and re-apply the ZIO XT patch monitor.   Your ZIO patch monitor will be sent USPS Priority mail from [facility] directly to your home address. The monitor may also be mailed to a PO BOX if home delivery is not available.   It may take 3-5 days to receive your monitor after you have been enrolled.   Once you have received you monitor, please review enclosed instructions.  Your monitor has already been registered assigning a specific monitor serial # to you.   Applying the monitor   Shave hair from upper left chest.   Hold abrader disc by orange tab.  Rub abrader in 40 strokes over left upper chest as indicated in your monitor instructions.   Clean area with 4 enclosed alcohol pads .  Use all pads to assure are is cleaned thoroughly.  Let dry.   Apply patch as indicated in monitor instructions.  Patch will be place under collarbone on left side of chest with arrow pointing upward.   Rub patch adhesive wings for 2 minutes.Remove white label marked "1".  Remove white label marked "2".  Rub patch adhesive wings for 2 additional minutes.   While looking in a mirror, press and release button in center of patch.  A small green light will flash 3-4 times .  This will be your  only indicator the monitor has been turned on.     Do not shower for the first 24 hours.  You may shower after the first 24 hours.   Press button if you feel a symptom. You will hear a small click.  Record Date, Time and Symptom in the Patient Log Book.   When you are ready to remove patch, follow instructions on last 2 pages of Patient Log Book.  Stick patch monitor onto last page of Patient Log Book.   Place Patient Log Book in [place] box.  Use locking tab on box and tape box closed securely.  The Orange  and [employer] has [employer] on it.  Please place in mailbox as soon as possible.  Your physician should have your test results approximately 7 days after the monitor has been mailed back to [facility].   Call [place] at [phone] if you have questions regarding your ZIO XT patch monitor.  Call them immediately if you see an orange light blinking on your monitor.   If your monitor falls off in less than 4 days contact our Monitor department at [phone].  If your monitor becomes loose or falls off after 4 days call [employer] at [phone] for suggestions on securing your monitor.

## 2020-04-25 NOTE — Progress Notes (Signed)
Patient ID: [name], female   DOB: May 15, 1941, 79 y.o.   MRN: [id number] Patient enrolled for 3 day ZIO XT long term holter monitor to be shipped to her home.

## 2020-04-26 ENCOUNTER — Ambulatory Visit: Payer: Medicare HMO

## 2020-04-26 DIAGNOSIS — E1140 Type 2 diabetes mellitus with diabetic neuropathy, unspecified: Secondary | ICD-10-CM

## 2020-04-26 DIAGNOSIS — Z794 Long term (current) use of insulin: Secondary | ICD-10-CM

## 2020-04-26 LAB — CBC
Hematocrit: 42.6 % (ref 34.0–46.6)
Hemoglobin: 14.3 g/dL (ref 11.1–15.9)
MCH: 32.1 pg (ref 26.6–33.0)
MCHC: 33.6 g/dL (ref 31.5–35.7)
MCV: 96 fL (ref 79–97)
Platelets: 248 10*3/uL (ref 150–450)
RBC: 4.46 x10E6/uL (ref 3.77–5.28)
RDW: 12.3 % (ref 11.7–15.4)
WBC: 9.8 10*3/uL (ref 3.4–10.8)

## 2020-04-26 NOTE — Patient Instructions (Signed)
[name],  Thank you for taking the time to review your medications with me today. We will send the patient assistance application to help with Eliquis - you will need to provided the requested financial information and sign/date then give to cardiology for final sign off.  If you do not receive the patient assistance application within two weeks please call me at [phone].   We also discussed getting back to testing your blood sugars due to serious risks of low blood sugars. Please let us know if you are having any difficulty testing.   Thanks! Ellin Mayhew, Pharm.D., BCGP Clinical Pharmacist [facility] [phone]  Goals Addressed            This Visit's Progress   . PharmD Care Plan   On track    CARE PLAN ENTRY Current Barriers:  . Chronic Disease Management support, education, and care coordination needs related to Hypertension, Diabetes, Heart Failure, and RLS   Hypertension / Heart Failure . Pharmacist Clinical Goal(s): o Over the next 90 days, patient will work with PharmD and providers to maintain BP goal <140/90 . Current regimen:   Carvedilol 3.125 mg twice daily  Lisinopril 20 mg daily  Amlodipine 7.5 mg daily  Potassium cl er 10 meq tablet - take 3 tablets in the morning and 2 tablets in the evening (50 meq/day) (9/21 lab)  Furosemide 80 mg twice daily  . Interventions: o BP monitoring plan . Patient self care activities - Over the next 90 days, patient will: o Check BP at least once every 1-2 weeks, document, and provide at future appointments - Please have son purchase blood pressure monitor with cuff for the upper arm so we can keep an eye on this  o Ensure daily salt intake < 2300 mg/day o Check weight once daily (same time each day) and write this down. If you notice more than a 3 lb weight gain in 1 day or > 5 lb increase in 3 days, you need to our office or the Cardiologist o Restrict fluid to 60  ounces (7.5 cups) or less  Diabetes . Pharmacist Clinical Goal(s): o Over the next 90 days, patient will work with PharmD and providers to maintain A1c goal  7.5% . Current regimen:   Metformin 1000 mg twice daily   Lantus 25 units once every night  . Interventions: o Please let us know if you are needing a replacement glucometer.  . Patient self care activities - Over the next 90 days, patient will: o Check blood sugar once daily, document, and provide at future appointments o Contact provider with any episodes of hypoglycemia RLS / Pain . Pharmacist Clinical Goal(s) o Over the next 180 days, patient will work with PharmD and providers to minimize RLS / pain symptoms . Current regimen:   Pramipexole 1 mg twice daily  . Interventions: o Continue current management . Patient self care activities - Over the next 180 days, patient will: o Continue current management  Medication management . Pharmacist Clinical Goal(s): o Over the next 90 days, patient will work with PharmD and providers to achieve optimal medication adherence o Patient assistance for eliquis . Current pharmacy: [facility] . Interventions o Comprehensive medication review performed. o Continue current medication management strategy . Patient self care activities - Over the next 180 days, patient will: o Focus on medication adherence by Continue current management o Take medications as prescribed o Report any  questions or concerns to PharmD and/or provider(s) Initial goal documentation.      The patient verbalized understanding of instructions provided today and agreed to receive a mailed copy of patient instruction and/or educational materials. Telephone follow up appointment with pharmacy team member scheduled for: See next appointment with "Care Management Staff" under "What's Next" below.

## 2020-04-26 NOTE — Progress Notes (Signed)
Chronic Care Management Pharmacy  Name: [name]  MRN: [id number] DOB: [birth year]  Chief Complaint/ HPI  [name],  79 y.o. , female presents for their Follow-Up CCM visit with the clinical pharmacist via telephone due to COVID-19 Pandemic. Pt is hard of hearing.  PCP : Brunetta Jeans, PA-C  Their chronic conditions include: Encounter Diagnosis  Name Primary?  . Type 2 diabetes mellitus with diabetic neuropathy, with long-term current use of insulin ([place]) Yes   Patient Active Problem List   Diagnosis Date Noted  . Educated about COVID-19 virus infection 11/30/2019  . Chronic respiratory failure with hypoxia and hypercapnia ([place]) 07/11/2019  . Acute on chronic diastolic CHF (congestive heart failure) ([place]) 07/08/2019  . Hypoxia 07/07/2019  . Hypokalemia 05/03/2019  . Facial asymmetry, acquired 08/20/2016  . Fracture of knee prosthesis ([place]) 08/01/2016  . Obesity 02/20/2016  . Chronic pain syndrome 02/19/2016  . Mild episode of recurrent major depressive disorder ([place]) 08/02/2015  . Chronic diastolic congestive heart failure ([place]) 07/16/2015  . Type 2 diabetes mellitus with diabetic neuropathy ([place]) 09/27/2014  . Hearing loss - has hearing aides, follow by audiologist 10/11/2013  . No blood products ([place])  09/11/2013  . Disorder of bone and cartilage 06/10/2010  . Breast cancer of upper-outer quadrant of right female breast ([place]) 04/04/2010  . RESTLESS LEGS SYNDROME 05/05/2007  . Obstructive sleep apnea 03/01/2007  . Essential hypertension 01/27/2007  . Osteoarthritis 01/27/2007   Social History   Socioeconomic History  . Marital status: Married    Spouse name: Not on file  . Number of children: 3  . Years of education: Not on file  . Highest education level: Not on file  Occupational History  . Not on file  Tobacco Use  . Smoking status: Never Smoker  . Smokeless tobacco: Never Used  Substance and Sexual Activity  . Alcohol use: No  .  Drug use: No  . Sexual activity: Yes  Other Topics Concern  . Not on file  Social History Narrative         Home Situation: lives with husband and granddaughter      Spiritual Beliefs: Jehovah Witness - no blood products               Social Determinants of Health   Financial Resource Strain: Not on file  Food Insecurity: No Food Insecurity  . Worried About Charity fundraiser in the Last Year: Never true  . Ran Out of Food in the Last Year: Never true  Transportation Needs: No Transportation Needs  . Lack of Transportation (Medical): No  . Lack of Transportation (Non-Medical): No  Physical Activity: Not on file  Stress: Not on file  Social Connections: Not on file   Family History  Problem Relation Age of Onset  . Alcohol abuse Father   . Cancer Sister        breast  . Heart disease Paternal Grandfather   . Diabetes Other   . Obesity Other   . Heart disease Brother        Valve   Allergies  Allergen Reactions  . Sulfa Antibiotics Swelling    Other reaction(s): Facial Swelling "swelling lips"   . Sulfonamide Derivatives Swelling    Lips Swelling  . Other     BLOOD PRODUCT REFUSAL    Current Outpatient Medications:  .  acetaminophen (TYLENOL) 325 MG tablet, Take 2 tablets (650 mg total) by mouth every 6 (six) hours as  needed for mild pain (or Fever >/= 101)., Disp: 12 tablet, Rfl: 0 .  albuterol (PROVENTIL) (2.5 MG/3ML) 0.083% nebulizer solution, Take 3 mLs (2.5 mg total) by nebulization every 6 (six) hours as needed for wheezing or shortness of breath., Disp: 75 mL, Rfl: 12 .  amLODipine (NORVASC) 10 MG tablet, Take 1 tablet (10 mg total) by mouth daily., Disp: 90 tablet, Rfl: 3 .  apixaban (ELIQUIS) 5 MG TABS tablet, Take 1 tablet (5 mg total) by mouth 2 (two) times daily., Disp: 60 tablet, Rfl: 11 .  carvedilol (COREG) 3.125 MG tablet, TAKE 1 TABLET TWICE DAILY WITH MEALS, Disp: 180 tablet, Rfl: 1 .  DROPLET PEN NEEDLES 32G X 4 MM MISC, USE AS DIRECTED ONE  TIME DAILY, Disp: 100 each, Rfl: 1 .  furosemide (LASIX) 80 MG tablet, Take 1 tablet (80 mg total) by mouth 2 (two) times daily., Disp: 60 tablet, Rfl: 6 .  LANTUS SOLOSTAR 100 UNIT/ML Solostar Pen, INJECT 25 UNITS INTO THE SKIN AT BEDTIME., Disp: 30 mL, Rfl: 2 .  lisinopril (ZESTRIL) 20 MG tablet, TAKE 1 TABLET (20 MG TOTAL) BY MOUTH DAILY., Disp: 90 tablet, Rfl: 2 .  metFORMIN (GLUCOPHAGE) 1000 MG tablet, TAKE 1 TABLET TWICE DAILY, Disp: 180 tablet, Rfl: 1 .  potassium chloride (KLOR-CON) 10 MEQ tablet, TAKE 2 TABS IN THE MORNING AND 1 TAB AT NIGHT., Disp: 270 tablet, Rfl: 0 .  pramipexole (MIRAPEX) 1 MG tablet, TAKE 1 TABLET TWICE DAILY, Disp: 180 tablet, Rfl: 1  Current Diagnosis/Assessment: Goals Addressed            This Visit's Progress   . PharmD Care Plan   On track    CARE PLAN ENTRY Current Barriers:  . Chronic Disease Management support, education, and care coordination needs related to Hypertension, Diabetes, Heart Failure, and RLS   Hypertension / Heart Failure . Pharmacist Clinical Goal(s): o Over the next 90 days, patient will work with PharmD and providers to maintain BP goal <140/90 . Current regimen:   Carvedilol 3.125 mg twice daily  Lisinopril 20 mg daily  Amlodipine 7.5 mg daily  Potassium cl er 10 meq tablet - take 3 tablets in the morning and 2 tablets in the evening (50 meq/day) (9/21 lab)  Furosemide 80 mg twice daily  . Interventions: o BP monitoring plan . Patient self care activities - Over the next 90 days, patient will: o Check BP at least once every 1-2 weeks, document, and provide at future appointments - Please have son purchase blood pressure monitor with cuff for the upper arm so we can keep an eye on this  o Ensure daily salt intake < 2300 mg/day o Check weight once daily (same time each day) and write this down. If you notice more than a 3 lb weight gain in 1 day or > 5 lb increase in 3 days, you need to our office or the  Cardiologist o Restrict fluid to 60 ounces (7.5 cups) or less  Diabetes . Pharmacist Clinical Goal(s): o Over the next 90 days, patient will work with PharmD and providers to maintain A1c goal  7.5% . Current regimen:   Metformin 1000 mg twice daily   Lantus 25 units once every night  . Interventions: o Please let us know if you are needing a replacement glucometer.  . Patient self care activities - Over the next 90 days, patient will: o Check blood sugar once daily, document, and provide at future appointments o Contact provider with any  episodes of hypoglycemia RLS / Pain . Pharmacist Clinical Goal(s) o Over the next 180 days, patient will work with PharmD and providers to minimize RLS / pain symptoms . Current regimen:   Pramipexole 1 mg twice daily  . Interventions: o Continue current management . Patient self care activities - Over the next 180 days, patient will: o Continue current management  Medication management . Pharmacist Clinical Goal(s): o Over the next 90 days, patient will work with PharmD and providers to achieve optimal medication adherence o Patient assistance for eliquis . Current pharmacy: [facility] order . Interventions o Comprehensive medication review performed. o Continue current medication management strategy . Patient self care activities - Over the next 180 days, patient will: o Focus on medication adherence by Continue current management o Take medications as prescribed o Report any questions or concerns to PharmD and/or provider(s) Initial goal documentation.      Atrial fibrillation    Pulse Readings from Last 3 Encounters:  04/25/20 82  12/08/19 83  12/01/19 81   New as of yesterday 04/25/2020, pt to wear monitor to determine if persistent.   Rate control currently with:  Coreg 3.125 mg twice daily   Stroke prevention in afib. CHA2DS2-VASc Score = 4. Does not seem to have started just yet and is very frustrated with cost of  Eliquis. Discussed medication at length with patient, she agrees to take initial Rx, and agrees to pursue patient assistance for this. I will send pt application materials in the mail, and she agrees to give to cardiology for sign off. Patient is currently prescribed the following medications:  . Eliquis 5 mg twice daily   We discussed:  Patient assistance options for eliquis - agreed to proceed with application process.   Plan  Eliquis pt assistance.  Continue current medications. Cardiology f/u 05/27/2020.   Diabetes   Goal a1c <7.5%  Lab Results  Component Value Date/Time   HGBA1C 6.8 (H) 01/03/2020 11:44 AM   HGBA1C 7.6 (H) 07/05/2019 02:23 PM   MICROALBUR 6.8 (H) 02/19/2014 11:12 AM   MICROALBUR 6.4 (H) 10/11/2013 09:32 AM   Is not testing BG at home. Have reviewed BG monitoring recommendations at home today and at previous visits. Denies symptoms of low BG. 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 Patient is currently at goal on the following medications:   Metformin 1000 mg twice daily   Lantus 25 units once every night   Reviewed risks of hypoglycemia  Reviewed home BG monitoring recommendations  Plan  Continue current medications.    Osteoporosis   Last DEXA Scan: 11/2019 -  osteoporosis.   VITD  Date Value Ref Range Status  01/03/2020 32.73 30.00 - 100.00 ng/mL Final   Goal to get pt on osteoporosis therapy, was not able to discuss w/ pt during our 03/07/2020 visit. Discussed options with pt today 04/26/2020 however pt understanding on this is extremely unclear due to telephone encounter and pt being hard of hearing. Unclear if pt has started on vitamin D3 otc.  Plan  PCP visit 06/2020.  RPh to help if needed with PAP on OP medications if needed.  Medication Management / Care Coordination   Receives prescription medications from:  [place], [place] [address] Phone: [phone] Fax: [phone]  Denies any issues organizing or getting her medications.    Plan  Continue current medication management  strategy.  Follow up:   Future Appointments  Date Time Provider [place]  05/27/2020  2:15 PM Eppie Gibson [facility]  07/10/2020 10:00 AM Brunetta Jeans, PA-C [facility]  10/31/2020  1:30 PM [facility] PHARMACIST [facility]   CPA: eliquis pap. RPH: 6 month f/u on DM testing, PAP needs. ______________ Visit Information  Madelin Rear, Pharm.D., BCGP Clinical Pharmacist [facility] [phone]

## 2020-04-29 ENCOUNTER — Ambulatory Visit (INDEPENDENT_AMBULATORY_CARE_PROVIDER_SITE_OTHER): Payer: Medicare HMO

## 2020-04-29 DIAGNOSIS — R002 Palpitations: Secondary | ICD-10-CM | POA: Diagnosis not present

## 2020-05-01 DIAGNOSIS — I5032 Chronic diastolic (congestive) heart failure: Secondary | ICD-10-CM | POA: Diagnosis not present

## 2020-05-01 DIAGNOSIS — R062 Wheezing: Secondary | ICD-10-CM | POA: Diagnosis not present

## 2020-05-01 DIAGNOSIS — G4733 Obstructive sleep apnea (adult) (pediatric): Secondary | ICD-10-CM | POA: Diagnosis not present

## 2020-05-08 DIAGNOSIS — R002 Palpitations: Secondary | ICD-10-CM | POA: Diagnosis not present

## 2020-05-19 NOTE — Progress Notes (Addendum)
Cardiology Office Note:    Date:  05/27/2020   ID:  [name], DOB [birth year], MRN [id number]  PCP:  Brunetta Jeans, PA-C  Cardiologist:  Minus Breeding, MD  Electrophysiologist:  None   Referring MD: Brunetta Jeans, PA-C   Chief Complaint: follow-up of atrial fibrillation and diastolic CHF  History of Present Illness:    [name] is a 80 y.o. 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 Percival Spanish and presents today for follow-up of atrial fibrillation and chronic diastolic CHF  Patient was referred to Dr. Percival Spanish in 2016 for pre-op evaluation for a knee surgery after EKG showed evidence of an old anteroseptal MI. Prior stress Echo in 2011 was normal. Patient was able to complete >4.0 METS without any problems and was therefore felt to be at acceptable risk for surgery. Since that time, patient has been followed mainly for chronic diastolic CHF. Most recent Echo in 06/2019 showed LVEF of 60-65% with normal wall motion, mild to moderate LVH, and grade 1 diastolic dysfunction. In 11/2019, she was having more shortness of breath and abdominal distension; therefore, Lasix was increased. Patient was last seen by Dr. Percival Spanish on 04/25/2020 at which time she was still noted to be volume overloaded on exam. There was concern that she was not taking Lasix 80mg  twice daily as prescribed and that she was not following sodium/fluid restrictions. She has a difficult social situation - she does have children that live locally but they were reportedly not her caregivers. Patient's daughter [name], who is her caregiver, lives in [place]. Dr. Percival Spanish had a long conversation with [name] who was was going to look into patient's  compliance with above measures. She was also noted to be in atrial fibrillation at this visit which was new. She was asymptomatic with this. She was started on Eliquis. Of note, there have been concerns with patient falling in the past. Daughter did not think patient was having problems with falling at last visit but daughter was instructed to let us know if she thinks she is at a high risk for falls at which time we might need to stop anticoagulation. Zio monitor was ordered and patient was instructed to follow-up in 1 month. Monitor showed continuous rate controlled atrial fibrillation with frequent PVCs sometimes in bigeminy and trigeminy pattern.   Patient presents today for follow-up. Here with husband. Patient is somewhat of a difficult historian. However, sounds like she is overall stable from a cardiac standpoint. She still has lower extremity edema but states this is improved. She notes some abdominal distension and states she has diffuse vague abdominal pain after eating. However, her weight is down 8 lbs since last visit 1 month ago. She thinks her shortness of breath may be a little worse but when I asked her about this it actually seems like it fluctuates. She feels the most short of breath when she is sitting watching TV. Get up and walking actually helps shortness of breath improve. She has stable orthopnea and no PND. No problems breathing at night as long as she uses her CPAP machine. She notes vague chest pain "once or twice" that she has difficulty describing. She states they only lasted for a couple of seconds before resolving on its own. Occurred at  rest. No prolonged chest pain or exertional chest pain. Does not sound cardiac in nature. She notes occasional palpitations but no lightheadedness, dizziness, or syncope.   Patient stated she has been compliant with her medications including Lasix and Eliquis. She does state she is take 20 mEq of K-Dur in the morning and 30 mEq in the evening  (she is supposed to only be taking 20 mEq in the morning and 10 mEq in the evening. She states someone told her to do this but cannot remember who. She is still concerned about whether or not she should be taking Eliquis. We discussed this today. She has not had a fall in about 2.5 months. She is not sure whether she is more afraid of a stroke or bleeding. She did not some bright red blood in stool from her external hemorrhoid for a couple of weeks. No bleeding noted in the last week. No other abnormal bleeding.  Husband expressed concern multiple times about patient previously being told she had "too much of something in her blood." Patient eye surgery was reportedly cancelled because of this. It sounds like he is talking about CO2. Patient reportedly needs cataract surgery and anesthesiology reported said she could not have this because she had too much of this in her blood. Went back and reviewed hospital records from admission in 06/2019 and did confirm this. She was scheduled for cataract surgery but O2 sats were found to be 80% at the time. Therefore, she was sent to the ED. ABG showed elevated PCO2 and evidence of respiratory acidosis. She required BiPAP at the time. Discussed that from a cardiac standpoint cataract surgeries are considered very low risk with no additional testing needed but recommended reaching out to Pulmonologist.   Past Medical History:  Diagnosis Date   AK (actinic keratosis)    Anxiety    occasional   Breast cancer (HCC)    R breast, s/p radiation 34Gy/46fx 04/29/10-05/05/10   CTS (carpal tunnel syndrome)    Left   Diabetes (HCC)    Diastolic CHF (HCC)    Dx w/ hospitalization 06/2015 for respiratory failure   Facial asymmetry, acquired 08/20/2016   From injury   Falls frequently    "can not pick left leg up"   Hearing loss    bilateral hearing aides   History of environmental allergies    History of kidney stones 20 years ago   Hypertension    MDD  (major depressive disorder)    No blood products (Jehovah Witness)  09/11/2013   Osteoarthritis    hx shoulder, knee, back, wrist pain; saw Dr. Lequita Halt    Personal history of radiation therapy    RESTLESS LEGS SYNDROME 05/05/2007   Qualifier: Diagnosis of  By: Shelle Iron MD, Maree Krabbe    RLS (restless legs syndrome)    Sleep apnea     Past Surgical History:  Procedure Laterality Date   APPENDECTOMY  age 51   BREAST LUMPECTOMY Right 2012   BREAST SURGERY  2012   rt breast lumpectomy   flex signoidoscopy     INCISION AND DRAINAGE PERIRECTAL ABSCESS     JOINT REPLACEMENT Right 2006   knee   TOTAL KNEE ARTHROPLASTY Left 08/06/2014   Procedure: LEFT TOTAL KNEE ARTHROPLASTY;  Surgeon: Ollen Gross, MD;  Location: [facility];  Service: Orthopedics;  Laterality: Left;   TUBAL LIGATION  1979    Current Medications: Current Meds  Medication Sig   acetaminophen (TYLENOL) 325 MG tablet Take 2 tablets (650  mg total) by mouth every 6 (six) hours as needed for mild pain (or Fever >/= 101).   albuterol (PROVENTIL) (2.5 MG/3ML) 0.083% nebulizer solution Take 3 mLs (2.5 mg total) by nebulization every 6 (six) hours as needed for wheezing or shortness of breath.   amLODipine (NORVASC) 10 MG tablet Take 1 tablet (10 mg total) by mouth daily.   apixaban (ELIQUIS) 5 MG TABS tablet Take 1 tablet (5 mg total) by mouth 2 (two) times daily.   carvedilol (COREG) 3.125 MG tablet TAKE 1 TABLET TWICE DAILY WITH MEALS   DROPLET PEN NEEDLES 32G X 4 MM MISC USE AS DIRECTED ONE TIME DAILY   LANTUS SOLOSTAR 100 UNIT/ML Solostar Pen INJECT 25 UNITS INTO THE SKIN AT BEDTIME.   lisinopril (ZESTRIL) 20 MG tablet TAKE 1 TABLET (20 MG TOTAL) BY MOUTH DAILY.   metFORMIN (GLUCOPHAGE) 1000 MG tablet TAKE 1 TABLET TWICE DAILY   potassium chloride (KLOR-CON) 10 MEQ tablet TAKE 2 TABS IN THE MORNING AND 1 TAB AT NIGHT.   pramipexole (MIRAPEX) 1 MG tablet TAKE 1 TABLET TWICE DAILY     Allergies:   Sulfa  antibiotics, Sulfonamide derivatives, and Other   Social History   Socioeconomic History   Marital status: Married    Spouse name: Not on file   Number of children: 3   Years of education: Not on file   Highest education level: Not on file  Occupational History   Not on file  Tobacco Use   Smoking status: Never Smoker   Smokeless tobacco: Never Used  Substance and Sexual Activity   Alcohol use: No   Drug use: No   Sexual activity: Yes  Other Topics Concern   Not on file  Social History Narrative         Home Situation: lives with husband and granddaughter      Spiritual Beliefs: Jehovah Witness - no blood products               Social Determinants of Health   Financial Resource Strain: Not on file  Food Insecurity: No Food Insecurity   Worried About Charity fundraiser in the Last Year: Never true   [place] in the Last Year: Never true  Transportation Needs: No Transportation Needs   Lack of Transportation (Medical): No   Lack of Transportation (Non-Medical): No  Physical Activity: Not on file  Stress: Not on file  Social Connections: Not on file     Family History: The patient's family history includes Alcohol abuse in her father; Cancer in her sister; Diabetes in an other family member; Heart disease in her brother and paternal grandfather; Obesity in an other family member.  ROS:   Please see the history of present illness.     EKGs/Labs/Other Studies Reviewed:    The following studies were reviewed today:  Echocardiogram 07/08/2019: Impressions: 1. Left ventricular ejection fraction, by estimation, is 60 to 65%. The  left ventricle has normal function. The left ventricle has no regional  wall motion abnormalities. There is mild to moderate left ventricular  hypertrophy. Left ventricular diastolic  parameters are consistent with Grade I diastolic dysfunction (impaired  relaxation).  2. Right ventricular systolic function is  normal. The right ventricular  size is normal. Tricuspid regurgitation signal is inadequate for assessing  PA pressure.  3. The mitral valve is grossly normal, there is mild to moderate annular  calcification. Trivial mitral valve regurgitation.  4. The aortic valve was not well  visualized. Mild aortic leaflet  calcification. Aortic valve regurgitation is mild.  5. IVC dilated, but not able to estimate CVP (no sniff).  _______________  3 Day Zio Monitor 04/2020: Atrial Fibrillation occurred continuously Rate well controlled.   Frequent ventricular ectopy. Positive ventricular bigeminy and trigeminy.   EKG:  EKG not ordered today.   Recent Labs: 07/08/2019: TSH 0.902 12/08/2019: BNP 264.5 01/03/2020: ALT 16 02/28/2020: BUN 17; Creatinine, Ser 0.72; Potassium 3.6; Sodium 140 04/25/2020: Hemoglobin 14.3; Platelets 248  Recent Lipid Panel    Component Value Date/Time   CHOL 136 12/27/2018 1037   TRIG 113.0 12/27/2018 1037   HDL 34.30 (L) 12/27/2018 1037   CHOLHDL 4 12/27/2018 1037   VLDL 22.6 12/27/2018 1037   LDLCALC 79 12/27/2018 1037   LDLDIRECT 111.6 02/14/2010 1406    Physical Exam:    Vital Signs: BP 139/84    Pulse 88    Ht 5\' 1"  (1.549 m)    Wt 197 lb 6.4 oz (89.5 kg)    SpO2 97%    BMI 37.30 kg/m     Wt Readings from Last 3 Encounters:  05/27/20 197 lb 6.4 oz (89.5 kg)  04/25/20 205 lb (93 kg)  12/08/19 (!) 210 lb 6.4 oz (95.4 kg)     General: 80 y.o. obese female in no acute distress. HEENT: Normocephalic and atraumatic. Sclera clear. EOMs intact. Neck: Supple. No carotid bruits. No JVD. Heart: Irregularly irregular rhythm with normal rate. Distinct S1 and S2. No murmurs, gallops, or rubs. Radial pulses 2+ and equal bilaterally. Lungs: No increased work of breathing. Clear to ausculation bilaterally. No wheezes, rhonchi, or rales.  Abdomen: Soft, obese, and non-tender to palpation.  MSK: Normal strength and tone for age.  Extremities: 1-2+ edema of bilateral  lower extremities (appear chronic). Skin: Warm and dry. Neuro: Alert and oriented x3. No focal deficits. Psych: Normal affect. Responds appropriately.   Assessment:    1. Persistent atrial fibrillation ([place])   2. Chronic diastolic CHF (congestive heart failure) ([place])   3. Primary hypertension     Plan:    Persistent Atrial Fibrillation - Noted to be in atrial fibrillation at last office visit in 04/2020 which was new. 3-Day Zio monitor showed continuous rate controlled atrial fibrillation with frequent PVCs sometimes in bigeminy and trigeminy pattern.  - EKG not obtained today but sounds like she is still in atrial fibrillation on exam. - Continue Coreg 3.125mg  twice daily.  - CHA2DS2-VASc = 6 (CHF, HTN, DM, age x2, female). Continue Eliquis 5mg  twice daily for now. Patient denies any falls in the last 2 months. She is still concerned about whether or not she should be on Eliquis. Had a discussion about risk of stroke vs risk of bleeding. She is not sure which one she is more afraid of. She does not bleeding from some external hemorrhoid since starting Eliquis (none in the last week). Will check hemoglobin. Will also discuss with Dr. Percival Spanish about his thoughts on whether we should continue Eliquis long-term after CBC results are back.  Chronic Diastolic CHF - Last Echo in 06/2019 showed LVEF of 60-65% with normal wall motion, mild to moderate LVH, and grade 1 diastolic dysfunction. - Patient still has some lower extremity edema but patient reports this has improved. Otherwise, does not look significantly volume overloaded on exam. Weight down 8lbs from last visit 1 month ago. - Continue Lasix 80mg  twice daily. - Continue daily weights and sodium/fluid restrictions.   Of note, patient takes she is  taking 16mEq of K-Dur in the AM and 30 mEq in the PM (only supposed to be taking 20 mEq in the AM and 10 mEq in the PM). Will recheck BMET today. If may be helpful to see if we can get a home  health RN to come out once a weeks to help with medications. There has been concerns about medication compliance in the past. I did not get a chance to ask patient about this today but can follow-up on this with lab results.  Atypical Chest Pain - Patient reports vague chest pain once or twice since last visit. She has a hard time describing this pain but states it only lasted for a couple of seconds each time before resolving on its own. No prolonged pain or exertional pain. She cannot remember the last time she felt this pain.  - Does not sound cardiac in nature. Will not order any additional work-up at this time.  Hypertension - BP relatively well controlled. - Continue Amlodipine 10mg  daily,  Coreg 3.125mg  twice daily, and Lisinopril 20mg  daily.   Severe Obstructive Sleep Apnea with Chronic Hypoxic and Hypercarbic Respiratory Failure - On BiPAP at night. - Followed by Pulmonology.  Type 2 Diabetes Mellitus - On Metformin at home. - Followed by PCP.  Bright Red Blood Per Rectum - Patient reports some bright red blood from rectum after starting Eliquis. None in the last week. She states this is from external hemorrhoids. She states she has never been seen by GI or had a colonoscopy.  - Will check CBC today to ensure hemoglobin is stable. - Recommend following up with PCP.  Disposition: Follow up in 3 months with Dr. Percival Spanish.   Medication Adjustments/Labs and Tests Ordered: Current medicines are reviewed at length with the patient today.  Concerns regarding medicines are outlined above.  Orders Placed This Encounter  Procedures   Basic metabolic panel   CBC   No orders of the defined types were placed in this encounter.   Patient Instructions  Medication Instructions:  NO CHANGES MADE TODAY *If you need a refill on your cardiac medications before your next appointment, please call your pharmacy*   Lab Work: CBC, BMP IN OUR LAB TODAY If you have labs (blood work) drawn  today and your tests are completely normal, you will receive your results only by:  [place] (if you have MyChart) OR  A paper copy in the mail If you have any lab test that is abnormal or we need to change your treatment, we will call you to review the results.   Testing/Procedures: NONE ORDERED   Follow-Up: At [facility], you and your health needs are our priority.  As part of our continuing mission to provide you with exceptional heart care, we have created designated Provider Care Teams.  These Care Teams include your primary Cardiologist (physician) and Advanced Practice Providers (APPs -  Physician Assistants and Nurse Practitioners) who all work together to provide you with the care you need, when you need it.  We recommend signing up for the patient portal called "MyChart".  Sign up information is provided on this After Visit Summary.  MyChart is used to connect with patients for Virtual Visits (Telemedicine).  Patients are able to view lab/test results, encounter notes, upcoming appointments, etc.  Non-urgent messages can be sent to your provider as well.   To learn more about what you can do with MyChart, go to [email or web address].    Your next appointment:   3  month(s)  The format for your next appointment:   In Person  Provider:   Dr. Minus Breeding, MD   Other Instructions Fill out the patient assistance paper work provided today and return to our office     Signed, Darreld Mclean, PA-C  05/27/2020 5:27 PM    [place]

## 2020-05-20 ENCOUNTER — Telehealth: Payer: Self-pay | Admitting: Cardiology

## 2020-05-20 NOTE — Telephone Encounter (Signed)
Results reviewed

## 2020-05-20 NOTE — Telephone Encounter (Signed)
° °  Pt is returning call to get echo result °

## 2020-05-24 MED ORDER — AMLODIPINE BESYLATE 10 MG PO TABS: 10.0000 mg | ORAL_TABLET | Freq: Every day | ORAL | 0 refills | Status: DC

## 2020-05-27 ENCOUNTER — Encounter: Payer: Self-pay | Admitting: Student

## 2020-05-27 ENCOUNTER — Other Ambulatory Visit: Payer: Self-pay

## 2020-05-27 ENCOUNTER — Ambulatory Visit: Payer: Medicare HMO | Admitting: Student

## 2020-05-27 VITALS — BP 139/84 | HR 88 | Ht 61.0 in | Wt 197.4 lb

## 2020-05-27 DIAGNOSIS — I10 Essential (primary) hypertension: Secondary | ICD-10-CM

## 2020-05-27 DIAGNOSIS — K625 Hemorrhage of anus and rectum: Secondary | ICD-10-CM | POA: Diagnosis not present

## 2020-05-27 DIAGNOSIS — J9611 Chronic respiratory failure with hypoxia: Secondary | ICD-10-CM | POA: Diagnosis not present

## 2020-05-27 DIAGNOSIS — E669 Obesity, unspecified: Secondary | ICD-10-CM

## 2020-05-27 DIAGNOSIS — J9612 Chronic respiratory failure with hypercapnia: Secondary | ICD-10-CM

## 2020-05-27 DIAGNOSIS — R0789 Other chest pain: Secondary | ICD-10-CM

## 2020-05-27 DIAGNOSIS — G4733 Obstructive sleep apnea (adult) (pediatric): Secondary | ICD-10-CM

## 2020-05-27 DIAGNOSIS — I4819 Other persistent atrial fibrillation: Secondary | ICD-10-CM

## 2020-05-27 DIAGNOSIS — I5032 Chronic diastolic (congestive) heart failure: Secondary | ICD-10-CM

## 2020-05-27 DIAGNOSIS — E1169 Type 2 diabetes mellitus with other specified complication: Secondary | ICD-10-CM

## 2020-05-27 NOTE — Patient Instructions (Signed)
Medication Instructions:  NO CHANGES MADE TODAY *If you need a refill on your cardiac medications before your next appointment, please call your pharmacy*   Lab Work: CBC, BMP IN OUR LAB TODAY If you have labs (blood work) drawn today and your tests are completely normal, you will receive your results only by: Marland Kitchen MyChart Message (if you have MyChart) OR . A paper copy in the mail If you have any lab test that is abnormal or we need to change your treatment, we will call you to review the results.   Testing/Procedures: NONE ORDERED   Follow-Up: At [facility], you and your health needs are our priority.  As part of our continuing mission to provide you with exceptional heart care, we have created designated Provider Care Teams.  These Care Teams include your primary Cardiologist (physician) and Advanced Practice Providers (APPs -  Physician Assistants and Nurse Practitioners) who all work together to provide you with the care you need, when you need it.  We recommend signing up for the patient portal called "MyChart".  Sign up information is provided on this After Visit Summary.  MyChart is used to connect with patients for Virtual Visits (Telemedicine).  Patients are able to view lab/test results, encounter notes, upcoming appointments, etc.  Non-urgent messages can be sent to your provider as well.   To learn more about what you can do with MyChart, go to [email or web address].    Your next appointment:   3 month(s)  The format for your next appointment:   In Person  Provider:   Dr. Minus Breeding, MD   Other Instructions Fill out the patient assistance paper work provided today and return to our office

## 2020-05-28 LAB — BASIC METABOLIC PANEL
BUN/Creatinine Ratio: 24 (ref 12–28)
BUN: 16 mg/dL (ref 8–27)
CO2: 28 mmol/L (ref 20–29)
Calcium: 9 mg/dL (ref 8.7–10.3)
Chloride: 101 mmol/L (ref 96–106)
Creatinine, Ser: 0.67 mg/dL (ref 0.57–1.00)
GFR calc Af Amer: 95 mL/min/{1.73_m2} (ref >59)
GFR calc non Af Amer: 83 mL/min/{1.73_m2} (ref >59)
Glucose: 71 mg/dL (ref 65–99)
Potassium: 3.7 mmol/L (ref 3.5–5.2)
Sodium: 144 mmol/L (ref 134–144)

## 2020-05-28 LAB — CBC
Hematocrit: 42.4 % (ref 34.0–46.6)
Hemoglobin: 14.3 g/dL (ref 11.1–15.9)
MCH: 32.1 pg (ref 26.6–33.0)
MCHC: 33.7 g/dL (ref 31.5–35.7)
MCV: 95 fL (ref 79–97)
Platelets: 269 10*3/uL (ref 150–450)
RBC: 4.46 x10E6/uL (ref 3.77–5.28)
RDW: 12.2 % (ref 11.7–15.4)
WBC: 10.3 10*3/uL (ref 3.4–10.8)

## 2020-05-29 ENCOUNTER — Telehealth: Payer: Self-pay

## 2020-05-29 NOTE — Progress Notes (Signed)
Chronic Care Management Pharmacy Assistant   Name: JAYLISSA FELTY  MRN: [id number] DOB: 1941-04-20  Reason for Encounter: Disease State   PCP : Brunetta Jeans, PA-C  Allergies:   Allergies  Allergen Reactions  . Sulfa Antibiotics Swelling    Other reaction(s): Facial Swelling "swelling lips"   . Sulfonamide Derivatives Swelling    Lips Swelling  . Other     BLOOD PRODUCT REFUSAL    Medications: Outpatient Encounter Medications as of 05/29/2020  Medication Sig  . acetaminophen (TYLENOL) 325 MG tablet Take 2 tablets (650 mg total) by mouth every 6 (six) hours as needed for mild pain (or Fever >/= 101).  Marland Kitchen albuterol (PROVENTIL) (2.5 MG/3ML) 0.083% nebulizer solution Take 3 mLs (2.5 mg total) by nebulization every 6 (six) hours as needed for wheezing or shortness of breath.  Marland Kitchen amLODipine (NORVASC) 10 MG tablet Take 1 tablet (10 mg total) by mouth daily.  Marland Kitchen apixaban (ELIQUIS) 5 MG TABS tablet Take 1 tablet (5 mg total) by mouth 2 (two) times daily.  . carvedilol (COREG) 3.125 MG tablet TAKE 1 TABLET TWICE DAILY WITH MEALS  . DROPLET PEN NEEDLES 32G X 4 MM MISC USE AS DIRECTED ONE TIME DAILY  . furosemide (LASIX) 80 MG tablet Take 1 tablet (80 mg total) by mouth 2 (two) times daily.  Marland Kitchen LANTUS SOLOSTAR 100 UNIT/ML Solostar Pen INJECT 25 UNITS INTO THE SKIN AT BEDTIME.  Marland Kitchen lisinopril (ZESTRIL) 20 MG tablet TAKE 1 TABLET (20 MG TOTAL) BY MOUTH DAILY.  . metFORMIN (GLUCOPHAGE) 1000 MG tablet TAKE 1 TABLET TWICE DAILY  . potassium chloride (KLOR-CON) 10 MEQ tablet TAKE 2 TABS IN THE MORNING AND 1 TAB AT NIGHT.  Marland Kitchen pramipexole (MIRAPEX) 1 MG tablet TAKE 1 TABLET TWICE DAILY   No facility-administered encounter medications on file as of 05/29/2020.    Current Diagnosis: Patient Active Problem List   Diagnosis Date Noted  . Educated about COVID-19 virus infection 11/30/2019  . Chronic respiratory failure with hypoxia and hypercapnia ([place]) 07/11/2019  . Acute on chronic diastolic  CHF (congestive heart failure) ([place]) 07/08/2019  . Hypoxia 07/07/2019  . Hypokalemia 05/03/2019  . Facial asymmetry, acquired 08/20/2016  . Fracture of knee prosthesis ([place]) 08/01/2016  . Obesity 02/20/2016  . Chronic pain syndrome 02/19/2016  . Mild episode of recurrent major depressive disorder ([place]) 08/02/2015  . Chronic diastolic congestive heart failure ([place]) 07/16/2015  . Type 2 diabetes mellitus with diabetic neuropathy ([place]) 09/27/2014  . Hearing loss - has hearing aides, follow by audiologist 10/11/2013  . No blood products ([employer] Witness)  09/11/2013  . Disorder of bone and cartilage 06/10/2010  . Breast cancer of upper-outer quadrant of right female breast ([place]) 04/04/2010  . RESTLESS LEGS SYNDROME 05/05/2007  . Obstructive sleep apnea 03/01/2007  . Essential hypertension 01/27/2007  . Osteoarthritis 01/27/2007    Recent Relevant Labs: Lab Results  Component Value Date/Time   HGBA1C 6.8 (H) 01/03/2020 11:44 AM   HGBA1C 7.6 (H) 07/05/2019 02:23 PM   MICROALBUR 6.8 (H) 02/19/2014 11:12 AM   MICROALBUR 6.4 (H) 10/11/2013 09:32 AM    Kidney Function Lab Results  Component Value Date/Time   CREATININE 0.67 05/27/2020 03:25 PM   CREATININE 0.72 02/28/2020 10:18 AM   CREATININE 0.58 (L) 11/05/2015 04:03 PM   CREATININE 0.8 01/25/2014 09:59 AM   CREATININE 0.8 04/20/2013 09:07 AM   GFR 78.57 02/28/2020 10:18 AM   GFRNONAA 83 05/27/2020 03:25 PM   GFRAA 95 05/27/2020 03:25 PM    .  Current antihyperglycemic regimen:           -metFORMIN (GLUCOPHAGE) 1000 MG tablet . What recent interventions/DTPs have been made to improve glycemic control: None Noted  . Have there been any recent hospitalizations or ED visits since last visit with CPP? No . Patient denies hypoglycemic symptoms, including None . Patient denies hyperglycemic symptoms, including none . How often are you checking your blood sugar?  .        Patient stated she has not been checking her blood sugars .     Marland Kitchen What are your blood sugars ranging?  o Fasting:  o Before meals:  o After meals:  o Bedtime:  . During the week, how often does your blood glucose drop below 70? Never . Are you checking your feet daily/regularly?              Patient husband states she does check .  Adherence Review: Is the patient currently on a STATIN medication? No Is the patient currently on ACE/ARB medication? Yes Does the patient have >5 day gap between last estimated fill dates?  CPP to review   Georgiana Shore ,[place] Pharmacist Assistant [place]  Follow-Up:  Pharmacist Review

## 2020-06-01 DIAGNOSIS — G4733 Obstructive sleep apnea (adult) (pediatric): Secondary | ICD-10-CM | POA: Diagnosis not present

## 2020-06-01 DIAGNOSIS — R062 Wheezing: Secondary | ICD-10-CM | POA: Diagnosis not present

## 2020-06-01 DIAGNOSIS — I5032 Chronic diastolic (congestive) heart failure: Secondary | ICD-10-CM | POA: Diagnosis not present

## 2020-06-03 ENCOUNTER — Other Ambulatory Visit: Payer: Self-pay | Admitting: Physician Assistant

## 2020-06-05 ENCOUNTER — Telehealth: Payer: Self-pay

## 2020-06-05 NOTE — Telephone Encounter (Signed)
D/w Sande Rives, PA-C, She would like to set up a VV 06-17-20  @ 1145am w/ Pt and Daughter, [name]. Called pt and daughter appt time is good for both. Will talk to them then. CG, PA-C notified

## 2020-06-05 NOTE — Telephone Encounter (Signed)
----- Message from Darreld Mclean, PA-C sent at 06/03/2020 12:36 PM EST ----- Regarding: FW: Eliquis Question Hi Naeema Patlan,  Hope you are enjoying the snow/ice and staying safe and warm. I was wondering if you could help me with this. I saw this patient last week and she had concerns about being on Eliquis given fall risk. Dr. Percival Spanish recommends setting up a virtual visit with the patient and her daughter [name] (number [phone]) who lives out of state so that we all can discuss this. Can you help set up a virtual visit with patient and daughter? If this is too difficult to arrange, just let me know and I will just call daughter and patient separately. Let me know if you have any questions.   Thank you so much! Callie  ----- Message ----- From: Minus Breeding, MD Sent: 06/03/2020  10:33 AM EST To: Darreld Mclean, PA-C Subject: RE: Eliquis Question                           She can stay on it but we need to document the conversation about whether this is her choice or not and preferably with her daughter on the line.  This would be a reasonable virtual visit conference call with you if you are willing.  Thanks.  ----- Message ----- From: Darreld Mclean, PA-C Sent: 06/02/2020   8:56 PM EST To: Minus Breeding, MD Subject: RE: Eliquis Question                           She has actually already been taking Eliquis. She has not had any falls since starting Eliquis but just seems very nervous about it. Would you recommend her continue it for now or just stop it?  I know there has also be concerns with medication compliance in the past. So I also was going to see if she would be interested in [place] trying to see if we could get a home health RN to help with medications.  Thanks so much! Callie  ----- Message ----- From: Minus Breeding, MD Sent: 06/02/2020   2:14 PM EST To: Darreld Mclean, PA-C Subject: RE: Eliquis Question                           The patient would need to be  completely bought into taking the anticoagulation.  I would not start it until we can have the conversation possibly with her daughter on the phone.  The living arrangement with her and her husband on their own and their daughter living across the country is not ideal.   Thanks    ----- Message ----- From: Darreld Mclean, PA-C Sent: 05/28/2020   9:51 AM EST To: Minus Breeding, MD Subject: Eliquis Question                               Hi Dr. Percival Spanish,  I saw this patient of yours yesterday. You saw her about 1 month ago. She was volume overloaded at the time and there was concern for medication noncompliance at home. However, she was also found to be in new onset atrial fibrillation at your visit and she was started on Eliquis. Patient did mention falling at home so a daughter was going to let us know if she felt she was  too big a fall risk. She has not had any falls since last visit (she thinks her last fall was 2.5 months ago). However, she is very concerned about being on Eliquis. Her CHA2DS2-VASc score = 6 (CHF, HTN, DM, age x2, female). I tried to discuss stroke risk vs bleeding risk with her again. She did mention that she had a couple of weeks of bleeding from her hemorrhoids after starting the Eliquis (none in the last week). I checked a CBC and it is completely normal. Just wanted to check with you on your thoughts about continuing Eliquis long-term given potential for falls (although does not sound like she has had any recently)?  Thank so much! Callie

## 2020-06-10 ENCOUNTER — Other Ambulatory Visit: Payer: Self-pay

## 2020-06-11 ENCOUNTER — Encounter: Payer: Self-pay | Admitting: Physician Assistant

## 2020-06-11 ENCOUNTER — Ambulatory Visit (INDEPENDENT_AMBULATORY_CARE_PROVIDER_SITE_OTHER): Payer: Medicare HMO | Admitting: Physician Assistant

## 2020-06-11 VITALS — BP 110/70 | HR 83 | Temp 98.2°F | Resp 16 | Ht 61.0 in | Wt 199.0 lb

## 2020-06-11 DIAGNOSIS — Z794 Long term (current) use of insulin: Secondary | ICD-10-CM | POA: Diagnosis not present

## 2020-06-11 DIAGNOSIS — M67911 Unspecified disorder of synovium and tendon, right shoulder: Secondary | ICD-10-CM

## 2020-06-11 DIAGNOSIS — E1140 Type 2 diabetes mellitus with diabetic neuropathy, unspecified: Secondary | ICD-10-CM | POA: Diagnosis not present

## 2020-06-11 LAB — COMPREHENSIVE METABOLIC PANEL
ALT: 13 U/L (ref 0–35)
AST: 18 U/L (ref 0–37)
Albumin: 3.7 g/dL (ref 3.5–5.2)
Alkaline Phosphatase: 102 U/L (ref 39–117)
BUN: 14 mg/dL (ref 6–23)
CO2: 32 mEq/L (ref 19–32)
Calcium: 9.3 mg/dL (ref 8.4–10.5)
Chloride: 98 mEq/L (ref 96–112)
Creatinine, Ser: 0.7 mg/dL (ref 0.40–1.20)
GFR: 81.23 mL/min (ref >60.00)
Glucose, Bld: 143 mg/dL — ABNORMAL HIGH (ref 70–99)
Potassium: 3.1 mEq/L — ABNORMAL LOW (ref 3.5–5.1)
Sodium: 138 mEq/L (ref 135–145)
Total Bilirubin: 0.6 mg/dL (ref 0.2–1.2)
Total Protein: 7 g/dL (ref 6.0–8.3)

## 2020-06-11 LAB — LIPID PANEL
Cholesterol: 101 mg/dL (ref 0–200)
HDL: 31.4 mg/dL — ABNORMAL LOW
LDL Cholesterol: 51 mg/dL (ref 0–99)
NonHDL: 70.08
Total CHOL/HDL Ratio: 3
Triglycerides: 95 mg/dL (ref 0.0–149.0)
VLDL: 19 mg/dL (ref 0.0–40.0)

## 2020-06-11 LAB — HEMOGLOBIN A1C: Hgb A1c MFr Bld: 6.9 % — ABNORMAL HIGH (ref 4.6–6.5)

## 2020-06-11 NOTE — Patient Instructions (Signed)
Please go to the lab today for blood work.  I will call you with your results. We will alter treatment regimen(s) if indicated by your results.   Avoid heavy lifting and overexertion.  Ok to continue use of the pain medication Dr. Nelva Bush gave you but do not take more than as directed.  Apply heating pad to the shoulder for 10-15 minutes a few times per day.  If your A1C is still stable we will add on a steroid to help further reduce inflammation.  If not improving/resolving I will want you to see a specialist.

## 2020-06-11 NOTE — Progress Notes (Signed)
Patient presents to clinic today c/o R shoulder pain with ROM x 3.5 days. Denies any trauma or injury. Denies neck pain or headache. Denies numbness, tinging or weakness of extremity. Notes pain is sharp when she lifts her arm. Does not radiate. Is able to have someone else lift her arm without pain. Has taken Hydrocodone given to her previously by Dr. Nelva Bush which she notes has helped.   Past Medical History:  Diagnosis Date  . AK (actinic keratosis)   . Anxiety    occasional  . Breast cancer ([place])    R breast, s/p radiation 34Gy/11f 04/29/10-05/05/10  . CTS (carpal tunnel syndrome)    Left  . Diabetes ([facility]   . Diastolic CHF ([facility]    Dx w/ hospitalization 06/2015 for respiratory failure  . Facial asymmetry, acquired 08/20/2016   From injury  . Falls frequently    "can not pick left leg up"  . Hearing loss    bilateral hearing aides  . History of environmental allergies   . History of kidney stones 20 years ago  . Hypertension   . MDD (major depressive disorder)   . No blood products ([place])  09/11/2013  . Osteoarthritis    hx shoulder, knee, back, wrist pain; saw Dr. AWynelle Link  . Personal history of radiation therapy   . RESTLESS LEGS SYNDROME 05/05/2007   Qualifier: Diagnosis of  By: CGwenette GreetMD, KArmando Reichert  . RLS (restless legs syndrome)   . Sleep apnea     Current Outpatient Medications on File Prior to Visit  Medication Sig Dispense Refill  . acetaminophen (TYLENOL) 325 MG tablet Take 2 tablets (650 mg total) by mouth every 6 (six) hours as needed for mild pain (or Fever >/= 101). 12 tablet 0  . albuterol (PROVENTIL) (2.5 MG/3ML) 0.083% nebulizer solution Take 3 mLs (2.5 mg total) by nebulization every 6 (six) hours as needed for wheezing or shortness of breath. 75 mL 12  . amLODipine (NORVASC) 10 MG tablet Take 1 tablet (10 mg total) by mouth daily. 10 tablet 0  . apixaban (ELIQUIS) 5 MG TABS tablet Take 1 tablet (5 mg total) by mouth 2 (two) times daily. 60 tablet  11  . carvedilol (COREG) 3.125 MG tablet TAKE 1 TABLET TWICE DAILY WITH MEALS 180 tablet 1  . DROPLET PEN NEEDLES 32G X 4 MM MISC USE AS DIRECTED ONE TIME DAILY 100 each 1  . LANTUS SOLOSTAR 100 UNIT/ML Solostar Pen INJECT 25 UNITS INTO THE SKIN AT BEDTIME. 30 mL 2  . lisinopril (ZESTRIL) 20 MG tablet TAKE 1 TABLET (20 MG TOTAL) BY MOUTH DAILY. 90 tablet 2  . metFORMIN (GLUCOPHAGE) 1000 MG tablet TAKE 1 TABLET TWICE DAILY 180 tablet 1  . potassium chloride (KLOR-CON) 10 MEQ tablet TAKE 2 TABS IN THE MORNING AND 1 TAB AT NIGHT. 270 tablet 0  . pramipexole (MIRAPEX) 1 MG tablet TAKE 1 TABLET TWICE DAILY 180 tablet 1  . furosemide (LASIX) 80 MG tablet Take 1 tablet (80 mg total) by mouth 2 (two) times daily. 60 tablet 6   No current facility-administered medications on file prior to visit.    Allergies  Allergen Reactions  . Sulfa Antibiotics Swelling    Other reaction(s): Facial Swelling "swelling lips"   . Sulfonamide Derivatives Swelling    Lips Swelling  . Other     BLOOD PRODUCT REFUSAL    Family History  Problem Relation Age of Onset  . Alcohol abuse Father   .  Cancer Sister        breast  . Heart disease Paternal Grandfather   . Diabetes Other   . Obesity Other   . Heart disease Brother        Valve    Social History   Socioeconomic History  . Marital status: Married    Spouse name: Not on file  . Number of children: 3  . Years of education: Not on file  . Highest education level: Not on file  Occupational History  . Not on file  Tobacco Use  . Smoking status: Never Smoker  . Smokeless tobacco: Never Used  Substance and Sexual Activity  . Alcohol use: No  . Drug use: No  . Sexual activity: Yes  Other Topics Concern  . Not on file  Social History Narrative         Home Situation: lives with husband and granddaughter      Spiritual Beliefs: [employer] - no blood products               Social Determinants of Health   Financial Resource  Strain: Not on file  Food Insecurity: No Food Insecurity  . Worried About Charity fundraiser in the Last Year: Never true  . Ran Out of Food in the Last Year: Never true  Transportation Needs: No Transportation Needs  . Lack of Transportation (Medical): No  . Lack of Transportation (Non-Medical): No  Physical Activity: Not on file  Stress: Not on file  Social Connections: Not on file   Review of Systems - See HPI.  All other ROS are negative.  BP 110/70   Pulse 83   Temp 98.2 F (36.8 C) (Temporal)   Resp 16   Ht '5\' 1"'  (1.549 m)   Wt 199 lb (90.3 kg)   SpO2 98%   BMI 37.60 kg/m   Physical Exam Vitals reviewed.  Constitutional:      Appearance: Normal appearance.  Cardiovascular:     Rate and Rhythm: Normal rate and regular rhythm.     Pulses: Normal pulses.     Heart sounds: Normal heart sounds.  Musculoskeletal:     Right shoulder: Tenderness present. No swelling, deformity or crepitus. Decreased range of motion (AROM secondary to pain. PROM intact). Normal strength.     Cervical back: Neck supple.     Comments: Pain with abduction and both IR/OR. No biceps tenderness.   Neurological:     General: No focal deficit present.     Mental Status: She is alert and oriented to person, place, and time.     Recent Results (from the past 2160 hour(s))  CBC     Status: None   Collection Time: 04/25/20 12:43 PM  Result Value Ref Range   WBC 9.8 3.4 - 10.8 x10E3/uL   RBC 4.46 3.77 - 5.28 x10E6/uL   Hemoglobin 14.3 11.1 - 15.9 g/dL   Hematocrit 42.6 34.0 - 46.6 %   MCV 96 79 - 97 fL   MCH 32.1 26.6 - 33.0 pg   MCHC 33.6 31.5 - 35.7 g/dL   RDW 12.3 11.7 - 15.4 %   Platelets 248 150 - 450 Q25Z5/GL  Basic metabolic panel     Status: None   Collection Time: 05/27/20  3:25 PM  Result Value Ref Range   Glucose 71 65 - 99 mg/dL   BUN 16 8 - 27 mg/dL   Creatinine, Ser 0.67 0.57 - 1.00 mg/dL   GFR calc non Af  Amer 83 >59 mL/min/1.73   GFR calc Af Amer 95 >59 mL/min/1.73     Comment: **In accordance with recommendations from the NKF-ASN Task force,**   [facility] is in the process of updating its eGFR calculation to the   2021 CKD-EPI creatinine equation that estimates kidney function   without a race variable.    BUN/Creatinine Ratio 24 12 - 28   Sodium 144 134 - 144 mmol/L   Potassium 3.7 3.5 - 5.2 mmol/L   Chloride 101 96 - 106 mmol/L   CO2 28 20 - 29 mmol/L   Calcium 9.0 8.7 - 10.3 mg/dL  CBC     Status: None   Collection Time: 05/27/20  3:25 PM  Result Value Ref Range   WBC 10.3 3.4 - 10.8 x10E3/uL   RBC 4.46 3.77 - 5.28 x10E6/uL   Hemoglobin 14.3 11.1 - 15.9 g/dL   Hematocrit 42.4 34.0 - 46.6 %   MCV 95 79 - 97 fL   MCH 32.1 26.6 - 33.0 pg   MCHC 33.7 31.5 - 35.7 g/dL   RDW 12.2 11.7 - 15.4 %   Platelets 269 150 - 450 x10E3/uL    Assessment/Plan: 1. Tendinopathy of right rotator cuff Atraumatic. On AC. Will start supportive measures and continue hydrocodone. If her A1C remains stable will start short course of steroid. Discussed we may need sports medicine evaluation and imaging if not improving.   2. Type 2 diabetes mellitus with diabetic neuropathy, with long-term current use of insulin ([place]) Taking medications as directed. Repeat labs today as she is overdue.  - Comprehensive metabolic panel - Hemoglobin A1c - Lipid panel  This visit occurred during the SARS-CoV-2 public health emergency.  Safety protocols were in place, including screening questions prior to the visit, additional usage of staff PPE, and extensive cleaning of exam room while observing appropriate contact time as indicated for disinfecting solutions.     Leeanne Rio, PA-C

## 2020-06-13 DIAGNOSIS — H01001 Unspecified blepharitis right upper eyelid: Secondary | ICD-10-CM | POA: Diagnosis not present

## 2020-06-13 DIAGNOSIS — H01005 Unspecified blepharitis left lower eyelid: Secondary | ICD-10-CM | POA: Diagnosis not present

## 2020-06-13 DIAGNOSIS — H25813 Combined forms of age-related cataract, bilateral: Secondary | ICD-10-CM | POA: Diagnosis not present

## 2020-06-13 DIAGNOSIS — H02834 Dermatochalasis of left upper eyelid: Secondary | ICD-10-CM | POA: Diagnosis not present

## 2020-06-13 DIAGNOSIS — H01002 Unspecified blepharitis right lower eyelid: Secondary | ICD-10-CM | POA: Diagnosis not present

## 2020-06-13 DIAGNOSIS — H02831 Dermatochalasis of right upper eyelid: Secondary | ICD-10-CM | POA: Diagnosis not present

## 2020-06-13 DIAGNOSIS — E113293 Type 2 diabetes mellitus with mild nonproliferative diabetic retinopathy without macular edema, bilateral: Secondary | ICD-10-CM | POA: Diagnosis not present

## 2020-06-13 DIAGNOSIS — H01004 Unspecified blepharitis left upper eyelid: Secondary | ICD-10-CM | POA: Diagnosis not present

## 2020-06-14 ENCOUNTER — Other Ambulatory Visit: Payer: Self-pay | Admitting: Emergency Medicine

## 2020-06-14 ENCOUNTER — Other Ambulatory Visit: Payer: Self-pay | Admitting: Cardiology

## 2020-06-14 DIAGNOSIS — E876 Hypokalemia: Secondary | ICD-10-CM

## 2020-06-15 NOTE — Progress Notes (Signed)
Virtual Visit via Telephone Note   This visit type was conducted due to national recommendations for restrictions regarding the COVID-19 Pandemic (e.g. social distancing) in an effort to limit this patient's exposure and mitigate transmission in our community.  Due to her co-morbid illnesses, this patient is at least at moderate risk for complications without adequate follow up.  This format is felt to be most appropriate for this patient at this time.  The patient did not have access to video technology/had technical difficulties with video requiring transitioning to audio format only (telephone).  All issues noted in this document were discussed and addressed.  No physical exam could be performed with this format.  Please refer to the patient's chart for her  consent to telehealth for [facility]. Virtual platform was offered given ongoing worsening Covid-19 pandemic.  Date:  06/17/2020   ID:  [name], DOB 06-27-1940, MRN [id number]  Patient Location: Home Provider Location: [facility]  PCP:  Brunetta Jeans, PA-C  Cardiologist:  Minus Breeding, MD  Electrophysiologist:  None   Evaluation Performed:  Follow-Up Visit  Chief Complaint:  discussion of anticoagulation   History of Present Illness:    [name] is a 80 y.o. 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 Percival Spanish and presents today to discuss continuation of anticoagulation.  Patient was referred to Dr. Percival Spanish in 2016 for pre-op evaluation for a knee surgery after EKG showed evidence of an old anteroseptal MI. Prior stress Echo in 2011 was normal. Patient was able to complete >4.0 METS without any problems and was therefore felt to be  at acceptable risk for surgery. Since that time, patient has been followed mainly for chronic diastolic CHF. Most recent Echo in 06/2019 showed LVEF of 60-65% with normal wall motion, mild to moderate LVH, and grade 1 diastolic dysfunction. In 11/2019, she was having more shortness of breath and abdominal distension; therefore, Lasix was increased.   Patient was seen by Dr. Percival Spanish on 04/25/2020 at which time she was still noted to be volume overloaded on exam. There was concern that she was not taking Lasix 80mg  twice daily as prescribed and that she was not following sodium/fluid restrictions. She has a difficult social situation - she does have children that live locally but they were reportedly not her caregivers. Patient's daughter [name], who is her caregiver, lives in [place]. Dr. Percival Spanish had a long conversation with [name] who was was going to look into patient's compliance with above measures. She was also noted to be in atrial fibrillation at this visit which was new. She was asymptomatic with this. She was started on Eliquis. Of note, there have been concerns with patient falling in the past. Daughter did not think patient was having problems with falling at last visit but daughter was instructed to let us know if she thinks she is at a high risk for falls at which time we might need to stop anticoagulation. Zio monitor was ordered and patient was instructed to follow-up in 1 month. Monitor showed continuous rate controlled atrial fibrillation with frequent PVCs sometimes in bigeminy and trigeminy pattern.   I saw the patient on 05/27/2020 for follow-up at which time she was stable overall from a cardiac standpoint. Her lower extremity edema had  improved and her weight was down 8 lbs from last visit. She still reported some shortness of breath that fluctuated. Patient expressed concerns about staying on Eliquis due to increased risk of bleeding. She noted some bright right blood in her stools from her  external hemorrhoids after starting the Eliquis but none in the last week before the visit. There had previously been concerns with her falling but she denied any falls in the last 2.5 months. Labs during that visit showed normal hemoglobin. I discussed with Dr. Percival Spanish about continuing Eliquis. He was fine with her staying on Eliquis if that was the patient's decision. He recommended setting up a conference call with patient and her daughter, [name], to get a better idea of patient's fall risk at home and for a final decision. Therefore, today virtual visit was arranged.  Had telephone visit with patient and daughter [name]. We all discussed anticoagulation in greater detail. We discussed stroke risk vs bleeding risk. Patient's CHAD2DS2-VASC score is 6 putting patient at a 9.7% risk of having a stroke annually. Patient has not had a fall in the past several months. Patient's biggest concern is that she can fall asleep without any warning. She has fallen asleep while walking before. Therefore, she is concerned about doing falling and sustaining a serious bleed if this were to happen again. After much discussion and shared decision making, plan is to continue Eliquis given patient has not had a fall in several months. If patient has recurrent falls, she will let us know and we will need to re-discuss whether anticoagulation should be stopped at that time.   Otherwise, patient doing well since last visit. Weights and shortness of breath stable. No chest pain, palpitations, lightheadedness, dizziness, or syncope. No recurrent bleeding from hemorrhoids.   Past Medical History:  Diagnosis Date   AK (actinic keratosis)    Anxiety    occasional   Breast cancer ([facility])    R breast, s/p radiation 34Gy/88fx 04/29/10-05/05/10   CTS (carpal tunnel syndrome)    Left   Diabetes ([facility])    Diastolic CHF ([facility])    Dx w/ hospitalization 06/2015 for respiratory failure   Facial asymmetry, acquired 08/20/2016   From  injury   Falls frequently    "can not pick left leg up"   Hearing loss    bilateral hearing aides   History of environmental allergies    History of kidney stones 20 years ago   Hypertension    MDD (major depressive disorder)    No blood products ([facility])  09/11/2013   Osteoarthritis    hx shoulder, knee, back, wrist pain; saw Dr. Wynelle Link    Personal history of radiation therapy    RESTLESS LEGS SYNDROME 05/05/2007   Qualifier: Diagnosis of  By: Gwenette Greet MD, Armando Reichert    RLS (restless legs syndrome)    Sleep apnea    Past Surgical History:  Procedure Laterality Date   APPENDECTOMY  age 82   BREAST LUMPECTOMY Right 2012   BREAST SURGERY  2012   rt breast lumpectomy   flex signoidoscopy     INCISION AND DRAINAGE PERIRECTAL ABSCESS     JOINT REPLACEMENT Right 2006   knee   TOTAL KNEE ARTHROPLASTY Left 08/06/2014   Procedure: LEFT TOTAL KNEE ARTHROPLASTY;  Surgeon: Gaynelle Arabian, MD;  Location: [facility];  Service: Orthopedics;  Laterality: Left;   TUBAL LIGATION  1979     Current Meds  Medication Sig   acetaminophen (TYLENOL) 325 MG tablet Take  2 tablets (650 mg total) by mouth every 6 (six) hours as needed for mild pain (or Fever >/= 101).   albuterol (PROVENTIL) (2.5 MG/3ML) 0.083% nebulizer solution Take 3 mLs (2.5 mg total) by nebulization every 6 (six) hours as needed for wheezing or shortness of breath.   apixaban (ELIQUIS) 5 MG TABS tablet Take 1 tablet (5 mg total) by mouth 2 (two) times daily.   carvedilol (COREG) 3.125 MG tablet TAKE 1 TABLET TWICE DAILY WITH MEALS   DROPLET PEN NEEDLES 32G X 4 MM MISC USE AS DIRECTED ONE TIME DAILY   LANTUS SOLOSTAR 100 UNIT/ML Solostar Pen INJECT 25 UNITS INTO THE SKIN AT BEDTIME.   lisinopril (ZESTRIL) 20 MG tablet TAKE 1 TABLET (20 MG TOTAL) BY MOUTH DAILY.   metFORMIN (GLUCOPHAGE) 1000 MG tablet TAKE 1 TABLET TWICE DAILY   potassium chloride (KLOR-CON) 10 MEQ tablet TAKE 2 TABS IN THE MORNING AND 1  TAB AT NIGHT.   pramipexole (MIRAPEX) 1 MG tablet TAKE 1 TABLET TWICE DAILY   [DISCONTINUED] amLODipine (NORVASC) 10 MG tablet TAKE 1 TABLET BY MOUTH EVERY DAY     Allergies:   Sulfa antibiotics, Sulfonamide derivatives, and Other   Social History   Tobacco Use   Smoking status: Never Smoker   Smokeless tobacco: Never Used  Substance Use Topics   Alcohol use: No   Drug use: No     Family Hx: The patient's family history includes Alcohol abuse in her father; Cancer in her sister; Diabetes in an other family member; Heart disease in her brother and paternal grandfather; Obesity in an other family member.  ROS:   Please see the history of present illness.    All other systems reviewed and are negative.   Prior CV studies:    The following studies were reviewed today:  Echocardiogram 07/08/2019: Impressions: 1. Left ventricular ejection fraction, by estimation, is 60 to 65%. The  left ventricle has normal function. The left ventricle has no regional  wall motion abnormalities. There is mild to moderate left ventricular  hypertrophy. Left ventricular diastolic  parameters are consistent with Grade I diastolic dysfunction (impaired  relaxation).  2. Right ventricular systolic function is normal. The right ventricular  size is normal. Tricuspid regurgitation signal is inadequate for assessing  PA pressure.  3. The mitral valve is grossly normal, there is mild to moderate annular  calcification. Trivial mitral valve regurgitation.  4. The aortic valve was not well visualized. Mild aortic leaflet  calcification. Aortic valve regurgitation is mild.  5. IVC dilated, but not able to estimate CVP (no sniff).  _______________  3 Day Zio Monitor 04/2020: Atrial Fibrillation occurred continuously Rate well controlled.  Frequent ventricular ectopy. Positive ventricular bigeminy and trigeminy.   EKG:  EKG not ordered today.   Labs/Other Tests and Data Reviewed:     EKG: Most recent EKG from 04/25/2020 personally reviewed and demonstrates: Atrial fibrillation, rate 82 bpm, with low voltage QRS and non-specific ST/T changes.  Recent Labs: 07/08/2019: TSH 0.902 12/08/2019: BNP 264.5 05/27/2020: Hemoglobin 14.3; Platelets 269 06/11/2020: ALT 13; BUN 14; Creatinine, Ser 0.70; Potassium 3.1; Sodium 138   Recent Lipid Panel Lab Results  Component Value Date/Time   CHOL 101 06/11/2020 10:32 AM   TRIG 95.0 06/11/2020 10:32 AM   HDL 31.40 (L) 06/11/2020 10:32 AM   CHOLHDL 3 06/11/2020 10:32 AM   LDLCALC 51 06/11/2020 10:32 AM   LDLDIRECT 111.6 02/14/2010 02:06 PM    Wt Readings from Last 3 Encounters:  06/17/20 194 lb (88 kg)  06/11/20 199 lb (90.3 kg)  05/27/20 197 lb 6.4 oz (89.5 kg)     Objective:    Vital Signs:  Ht 5\' 1"  (1.549 m)    Wt 194 lb (88 kg)    BMI 36.66 kg/m    Vital Signs Reviewed. Patient unable to measure BP or HR today. General: No acute distress. Pulm: No labored breathing. No audible wheezing. Speaking in full sentences. Neuro: Alert and oriented. No slurred speech. Answers questions appropriately. Psych: Pleasant affect.  ASSESSMENT & PLAN:    Persistent Atrial Fibrillation - Noted to be in atrial fibrillation at office visit in 04/2020 which was new. 3-Day Zio monitor showed continuous rate controlled atrial fibrillation with frequent PVCs sometimes in bigeminy and trigeminy pattern.  - Patient denies any palpitations. - Continue Coreg 3.125mg  twice daily.  - CHA2DS2-VASc = 6 (CHF, HTN, DM, age x2, female). Had long discussion with patient and daughter [name] today about stroke risk vs. bleeding risk. Discussed 9.7% chance of stroke annually given CHA2DS2-VASc score. Patient has had no falls in the past several months. Therefore, patient and daughter would like to continue Eliquis for now until falls become more of an issue. I think this is very reasonable. Patient will let us know if she has recurrent fall.   Patient and  daughter did voice concern about the cost of Eliquis. At last visit, we provided Patient Assistant form but patient thinks the form got thrown away. We will resent form to patient.    Chronic Diastolic CHF - Last Echo in 06/2019 showed LVEF of 60-65% with normal wall motion, mild to moderate LVH, and grade 1 diastolic dysfunction. - Unable to assess volume status on the phone but patient denies any acute CHF symptoms. Weights stable.  - Continue Lasix 80mg  twice daily. Continue K-Dur 20 mEq in AM and 10 mEq in PM.  There has been concerns about medication compliance in the past. Offered to see if we could have home health RN come by once a week to help with medications and possibly check vitals. Patient and daughter were agreeable to this so will place home health order.   Hypertension - Patient unable to measure BP today but it was well controlled at last visit. - Continue Amlodipine 10mg  daily,  Coreg 3.125mg  twice daily, and Lisinopril 20mg  daily.  - Provided refill of Amlodipine per patient's request.   Severe Obstructive Sleep Apnea with Chronic Hypoxic and Hypercarbic Respiratory Failure - On BiPAP at night. - Followed by Pulmonology.  Type 2 Diabetes Mellitus - On Metformin at home. - Followed by PCP.  COVID-19 Education: The signs and symptoms of COVID-19 were discussed with the patient and how to seek care for testing (follow up with PCP or arrange E-visit).  The importance of social distancing was discussed today.  Time:   Today, I have spent 40 minutes with the patient with telehealth technology discussing the above problems.     Medication Adjustments/Labs and Tests Ordered: Current medicines are reviewed at length with the patient today.  Concerns regarding medicines are outlined above.   Follow Up: Keep follow-up visit with Dr. Percival Spanish in 08/2020.  Signed, Darreld Mclean, PA-C  06/17/2020 8:32 PM    [facility]

## 2020-06-17 ENCOUNTER — Encounter: Payer: Self-pay | Admitting: Student

## 2020-06-17 ENCOUNTER — Telehealth (INDEPENDENT_AMBULATORY_CARE_PROVIDER_SITE_OTHER): Payer: Medicare HMO | Admitting: Student

## 2020-06-17 ENCOUNTER — Telehealth: Payer: Self-pay

## 2020-06-17 VITALS — Ht 61.0 in | Wt 194.0 lb

## 2020-06-17 DIAGNOSIS — E118 Type 2 diabetes mellitus with unspecified complications: Secondary | ICD-10-CM

## 2020-06-17 DIAGNOSIS — I5032 Chronic diastolic (congestive) heart failure: Secondary | ICD-10-CM

## 2020-06-17 DIAGNOSIS — G4733 Obstructive sleep apnea (adult) (pediatric): Secondary | ICD-10-CM | POA: Diagnosis not present

## 2020-06-17 DIAGNOSIS — I10 Essential (primary) hypertension: Secondary | ICD-10-CM

## 2020-06-17 DIAGNOSIS — Z794 Long term (current) use of insulin: Secondary | ICD-10-CM | POA: Diagnosis not present

## 2020-06-17 DIAGNOSIS — I4819 Other persistent atrial fibrillation: Secondary | ICD-10-CM | POA: Diagnosis not present

## 2020-06-17 MED ORDER — AMLODIPINE BESYLATE 10 MG PO TABS: 10.0000 mg | ORAL_TABLET | Freq: Every day | ORAL | 3 refills | Status: DC

## 2020-06-17 NOTE — Patient Instructions (Signed)
Medication Instructions:  No Changes *If you need a refill on your cardiac medications before your next appointment, please call your pharmacy*   Lab Work: No Changes If you have labs (blood work) drawn today and your tests are completely normal, you will receive your results only by: Marland Kitchen MyChart Message (if you have MyChart) OR . A paper copy in the mail If you have any lab test that is abnormal or we need to change your treatment, we will call you to review the results.   Testing/Procedures: No Testing   Follow-Up: At [facility], you and your health needs are our priority.  As part of our continuing mission to provide you with exceptional heart care, we have created designated Provider Care Teams.  These Care Teams include your primary Cardiologist (physician) and Advanced Practice Providers (APPs -  Physician Assistants and Nurse Practitioners) who all work together to provide you with the care you need, when you need it.   Your next appointment:   September 10, 2020 1:40 PM  The format for your next appointment:   In Person  Provider:   Minus Breeding, MD

## 2020-06-27 ENCOUNTER — Other Ambulatory Visit: Payer: Self-pay

## 2020-06-27 ENCOUNTER — Other Ambulatory Visit (INDEPENDENT_AMBULATORY_CARE_PROVIDER_SITE_OTHER): Payer: Medicare HMO

## 2020-06-27 DIAGNOSIS — E876 Hypokalemia: Secondary | ICD-10-CM

## 2020-06-27 LAB — POTASSIUM: Potassium: 3.4 mEq/L — ABNORMAL LOW (ref 3.5–5.1)

## 2020-07-02 DIAGNOSIS — G4733 Obstructive sleep apnea (adult) (pediatric): Secondary | ICD-10-CM | POA: Diagnosis not present

## 2020-07-02 DIAGNOSIS — I5032 Chronic diastolic (congestive) heart failure: Secondary | ICD-10-CM | POA: Diagnosis not present

## 2020-07-02 DIAGNOSIS — R062 Wheezing: Secondary | ICD-10-CM | POA: Diagnosis not present

## 2020-07-08 ENCOUNTER — Other Ambulatory Visit: Payer: Self-pay | Admitting: Physician Assistant

## 2020-07-08 DIAGNOSIS — E1140 Type 2 diabetes mellitus with diabetic neuropathy, unspecified: Secondary | ICD-10-CM

## 2020-07-08 DIAGNOSIS — Z794 Long term (current) use of insulin: Secondary | ICD-10-CM

## 2020-07-10 ENCOUNTER — Other Ambulatory Visit: Payer: Self-pay

## 2020-07-10 ENCOUNTER — Ambulatory Visit (INDEPENDENT_AMBULATORY_CARE_PROVIDER_SITE_OTHER): Payer: Medicare HMO | Admitting: Physician Assistant

## 2020-07-10 ENCOUNTER — Encounter: Payer: Self-pay | Admitting: Physician Assistant

## 2020-07-10 ENCOUNTER — Ambulatory Visit: Payer: Medicare HMO | Admitting: Physician Assistant

## 2020-07-10 VITALS — BP 117/80 | HR 67 | Temp 98.0°F | Ht 61.0 in | Wt 198.2 lb

## 2020-07-10 DIAGNOSIS — I10 Essential (primary) hypertension: Secondary | ICD-10-CM | POA: Diagnosis not present

## 2020-07-10 DIAGNOSIS — M159 Polyosteoarthritis, unspecified: Secondary | ICD-10-CM | POA: Diagnosis not present

## 2020-07-10 DIAGNOSIS — E876 Hypokalemia: Secondary | ICD-10-CM | POA: Diagnosis not present

## 2020-07-10 DIAGNOSIS — J9611 Chronic respiratory failure with hypoxia: Secondary | ICD-10-CM

## 2020-07-10 DIAGNOSIS — I482 Chronic atrial fibrillation, unspecified: Secondary | ICD-10-CM

## 2020-07-10 DIAGNOSIS — G4733 Obstructive sleep apnea (adult) (pediatric): Secondary | ICD-10-CM

## 2020-07-10 DIAGNOSIS — Z853 Personal history of malignant neoplasm of breast: Secondary | ICD-10-CM

## 2020-07-10 DIAGNOSIS — I5032 Chronic diastolic (congestive) heart failure: Secondary | ICD-10-CM

## 2020-07-10 DIAGNOSIS — J9612 Chronic respiratory failure with hypercapnia: Secondary | ICD-10-CM

## 2020-07-10 DIAGNOSIS — Z794 Long term (current) use of insulin: Secondary | ICD-10-CM

## 2020-07-10 DIAGNOSIS — E1140 Type 2 diabetes mellitus with diabetic neuropathy, unspecified: Secondary | ICD-10-CM

## 2020-07-10 NOTE — Patient Instructions (Signed)
Wonderful to meet you today! See you back in early April for labs and med-check. Call sooner if any concerns.

## 2020-07-10 NOTE — Progress Notes (Signed)
New Patient Office Visit  Subjective:  Patient ID: [name], female    DOB: 1940/11/07  Age: 80 y.o. MRN: [id number]  CC: No chief complaint on file.   HPI CORINNE GOUCHER presents for Transfer of Care from [facility], [place]. She is here with her husband today. She has a hx of Type II DM, HTN, CHF, OSA, chronic respiratory failure (wears oxygen at night), OA mostly in back, hypokalemia, and breast cancer 2011. A-fib new diagnosis in 04/2020 and started on Eliquis. She is currently stable on her medications and does not have any acute concerns to address today.  Past Medical History:  Diagnosis Date  . AK (actinic keratosis)   . Anxiety    occasional  . Breast cancer ([place])    R breast, s/p radiation 34Gy/90fx 04/29/10-05/05/10  . CTS (carpal tunnel syndrome)    Left  . Diabetes ([place])   . Diastolic CHF ([place])    Dx w/ hospitalization 06/2015 for respiratory failure  . Facial asymmetry, acquired 08/20/2016   From injury  . Falls frequently    "can not pick left leg up"  . Hearing loss    bilateral hearing aides  . History of environmental allergies   . History of kidney stones 20 years ago  . Hypertension   . MDD (major depressive disorder)   . No blood products ([place])  09/11/2013  . Osteoarthritis    hx shoulder, knee, back, wrist pain; saw Dr. Wynelle Link   . Personal history of radiation therapy   . RESTLESS LEGS SYNDROME 05/05/2007   Qualifier: Diagnosis of  By: Gwenette Greet MD, Armando Reichert   . RLS (restless legs syndrome)   . Sleep apnea     Past Surgical History:  Procedure Laterality Date  . APPENDECTOMY  age 81  . BREAST LUMPECTOMY Right 2012  . BREAST SURGERY  2012   rt breast lumpectomy  . flex signoidoscopy    . INCISION AND DRAINAGE PERIRECTAL ABSCESS    . JOINT REPLACEMENT Right 2006   knee  . TOTAL KNEE ARTHROPLASTY Left 08/06/2014   Procedure: LEFT TOTAL KNEE ARTHROPLASTY;  Surgeon: Gaynelle Arabian, MD;  Location: [facility];  Service: Orthopedics;   Laterality: Left;  . TUBAL LIGATION  1979    Family History  Problem Relation Age of Onset  . Alcohol abuse Father   . Cancer Sister        breast  . Heart disease Paternal Grandfather   . Diabetes Other   . Obesity Other   . Heart disease Brother        Valve    Social History   Socioeconomic History  . Marital status: Married    Spouse name: Not on file  . Number of children: 3  . Years of education: Not on file  . Highest education level: Not on file  Occupational History  . Not on file  Tobacco Use  . Smoking status: Never Smoker  . Smokeless tobacco: Never Used  Substance and Sexual Activity  . Alcohol use: No  . Drug use: No  . Sexual activity: Yes  Other Topics Concern  . Not on file  Social History Narrative         Home Situation: lives with husband and granddaughter      Spiritual Beliefs: [place] Witness - no blood products               Social Determinants of Health   Financial Resource Strain: Not on file  Food Insecurity: No Food Insecurity  . Worried About Charity fundraiser in the Last Year: Never true  . Ran Out of Food in the Last Year: Never true  Transportation Needs: No Transportation Needs  . Lack of Transportation (Medical): No  . Lack of Transportation (Non-Medical): No  Physical Activity: Not on file  Stress: Not on file  Social Connections: Not on file  Intimate Partner Violence: Not on file    ROS Review of Systems  Constitutional: Negative for activity change, appetite change, fever and unexpected weight change.  HENT: Negative for congestion.   Eyes: Negative for visual disturbance.  Respiratory: Negative for apnea, cough and shortness of breath.   Cardiovascular: Negative for chest pain, palpitations and leg swelling.  Gastrointestinal: Negative for abdominal pain, blood in stool, constipation and diarrhea.  Endocrine: Negative for polydipsia, polyphagia and polyuria.  Genitourinary: Negative for dysuria.   Musculoskeletal: Positive for arthralgias and back pain.  Skin: Negative for rash.  Neurological: Negative for dizziness, weakness and headaches.  Hematological: Negative for adenopathy. Does not bruise/bleed easily.  Psychiatric/Behavioral: Negative for sleep disturbance and suicidal ideas. The patient is not nervous/anxious.     Objective:   Today's Vitals: BP 117/80   Pulse 67   Temp 98 F (36.7 C)   Ht 5\' 1"  (1.549 m)   Wt 198 lb 3.2 oz (89.9 kg)   SpO2 97%   BMI 37.45 kg/m   Physical Exam Vitals and nursing note reviewed.  Constitutional:      Appearance: Normal appearance. She is normal weight. She is not toxic-appearing.  HENT:     Head: Normocephalic and atraumatic.     Right Ear: Tympanic membrane, ear canal and external ear normal.     Left Ear: Tympanic membrane, ear canal and external ear normal.     Nose: Nose normal.     Mouth/Throat:     Mouth: Mucous membranes are moist.  Eyes:     Extraocular Movements: Extraocular movements intact.     Conjunctiva/sclera: Conjunctivae normal.     Pupils: Pupils are equal, round, and reactive to light.  Cardiovascular:     Rate and Rhythm: Normal rate. Rhythm irregular.     Pulses: Normal pulses.     Heart sounds: Normal heart sounds.  Pulmonary:     Effort: Pulmonary effort is normal.     Breath sounds: Normal breath sounds.  Abdominal:     General: Abdomen is flat. Bowel sounds are normal.     Palpations: Abdomen is soft.  Musculoskeletal:        General: Normal range of motion.     Cervical back: Normal range of motion and neck supple.  Skin:    General: Skin is warm and dry.  Neurological:     General: No focal deficit present.     Mental Status: She is alert and oriented to person, place, and time.     Comments: USES A CANE  Psychiatric:        Mood and Affect: Mood normal.        Behavior: Behavior normal.        Thought Content: Thought content normal.        Judgment: Judgment normal.      Assessment & Plan:   Problem List Items Addressed This Visit      Cardiovascular and Mediastinum   Essential hypertension   Chronic diastolic congestive heart failure (HCC)     Respiratory   Obstructive sleep apnea  Chronic respiratory failure with hypoxia and hypercapnia (HCC)     Endocrine   Type 2 diabetes mellitus with diabetic neuropathy (HCC) - Primary     Musculoskeletal and Integument   Osteoarthritis     Other   Hypokalemia    Other Visit Diagnoses    Personal history of breast cancer       Chronic atrial fibrillation [facility])          Outpatient Encounter Medications as of 07/10/2020  Medication Sig  . acetaminophen (TYLENOL) 325 MG tablet Take 2 tablets (650 mg total) by mouth every 6 (six) hours as needed for mild pain (or Fever >/= 101).  Marland Kitchen albuterol (PROVENTIL) (2.5 MG/3ML) 0.083% nebulizer solution Take 3 mLs (2.5 mg total) by nebulization every 6 (six) hours as needed for wheezing or shortness of breath.  Marland Kitchen amLODipine (NORVASC) 10 MG tablet Take 1 tablet (10 mg total) by mouth daily.  Marland Kitchen apixaban (ELIQUIS) 5 MG TABS tablet Take 1 tablet (5 mg total) by mouth 2 (two) times daily.  . carvedilol (COREG) 3.125 MG tablet TAKE 1 TABLET TWICE DAILY WITH MEALS  . DROPLET PEN NEEDLES 32G X 4 MM MISC USE AS DIRECTED ONE TIME DAILY  . LANTUS SOLOSTAR 100 UNIT/ML Solostar Pen INJECT 25 UNITS INTO THE SKIN AT BEDTIME.  Marland Kitchen lisinopril (ZESTRIL) 20 MG tablet TAKE 1 TABLET (20 MG TOTAL) BY MOUTH DAILY.  . metFORMIN (GLUCOPHAGE) 1000 MG tablet TAKE 1 TABLET TWICE DAILY  . potassium chloride (KLOR-CON) 10 MEQ tablet TAKE 2 TABS IN THE MORNING AND 1 TAB AT NIGHT. (Patient taking differently: Take 2 tabs in the morning and 2 tab at night.)  . pramipexole (MIRAPEX) 1 MG tablet TAKE 1 TABLET TWICE DAILY  . furosemide (LASIX) 80 MG tablet Take 1 tablet (80 mg total) by mouth 2 (two) times daily.   No facility-administered encounter medications on file as of 07/10/2020.     Follow-up: Return in about 2 months (around 09/07/2020) for Labs and med check.   1. Type 2 diabetes mellitus with diabetic neuropathy, with long-term current use of insulin (HCC) Currently taking 25 units of Lantus at bedtime and Metformin 1000 mg BID. Will f/up in 2 months. Last Ha1C was 6.9 on 06/11/20.   2. Essential hypertension Stable. Norvasc 10 mg and Lisinopril 20 mg daily.   3. Chronic diastolic congestive heart failure (HCC) F/up with Dr. Percival Spanish.  4. Obstructive sleep apnea Stable, uses CPAP.  5. Chronic respiratory failure with hypoxia and hypercapnia (HCC) F/up with Dr. Elsworth Soho. Continue bedtime oxygen use.  6. Osteoarthritis of multiple joints, unspecified osteoarthritis type Taking Tylenol 650 mg every 6 hours. She may benefit from PT course.  7. Hypokalemia 06/27/20 - 3/4, improved from 3.1 about 4 weeks ago. Continue Klor-con 2 tab BID. May also increase citrus foods. We will recheck in 2 months.  8. Personal history of breast cancer 2011 - R breast. No concerns today, just noting history.  9. Chronic atrial fibrillation (HCC) F/up with Dr. Percival Spanish. Currently taking Eliquis 5 mg BID and Coreg 3.125 mg 1 tab BID.   This visit occurred during the SARS-CoV-2 public health emergency.  Safety protocols were in place, including screening questions prior to the visit, additional usage of staff PPE, and extensive cleaning of exam room while observing appropriate contact time as indicated for disinfecting solutions.   Time spent obtaining history from patient and reviewing her records: 35 minutes.   Mao Lockner M Gaige Fussner, PA-C

## 2020-07-15 ENCOUNTER — Encounter: Payer: Self-pay | Admitting: Physician Assistant

## 2020-07-15 DIAGNOSIS — I5032 Chronic diastolic (congestive) heart failure: Secondary | ICD-10-CM

## 2020-07-15 DIAGNOSIS — R609 Edema, unspecified: Secondary | ICD-10-CM

## 2020-07-15 DIAGNOSIS — J9611 Chronic respiratory failure with hypoxia: Secondary | ICD-10-CM

## 2020-07-15 DIAGNOSIS — R0602 Shortness of breath: Secondary | ICD-10-CM

## 2020-07-15 DIAGNOSIS — I5033 Acute on chronic diastolic (congestive) heart failure: Secondary | ICD-10-CM

## 2020-07-15 NOTE — Telephone Encounter (Signed)
I thought we had ordered a home health nurse. Can we follow up on this?  Also, I have the same questions as [place]... Does she have a log of her daily weights so we can see over what time span she has gained the 5lbs? And Is she having any worsening shortness of breath?  Thank you!

## 2020-07-16 ENCOUNTER — Other Ambulatory Visit: Payer: Self-pay

## 2020-07-16 ENCOUNTER — Encounter: Payer: Self-pay | Admitting: Family Medicine

## 2020-07-16 ENCOUNTER — Ambulatory Visit (INDEPENDENT_AMBULATORY_CARE_PROVIDER_SITE_OTHER): Payer: Medicare HMO | Admitting: Family Medicine

## 2020-07-16 VITALS — BP 118/76 | HR 77 | Temp 97.5°F | Wt 199.0 lb

## 2020-07-16 DIAGNOSIS — I5032 Chronic diastolic (congestive) heart failure: Secondary | ICD-10-CM

## 2020-07-16 DIAGNOSIS — I10 Essential (primary) hypertension: Secondary | ICD-10-CM

## 2020-07-16 DIAGNOSIS — I4891 Unspecified atrial fibrillation: Secondary | ICD-10-CM | POA: Diagnosis not present

## 2020-07-16 DIAGNOSIS — I872 Venous insufficiency (chronic) (peripheral): Secondary | ICD-10-CM | POA: Diagnosis not present

## 2020-07-16 DIAGNOSIS — J9611 Chronic respiratory failure with hypoxia: Secondary | ICD-10-CM

## 2020-07-16 DIAGNOSIS — J9612 Chronic respiratory failure with hypercapnia: Secondary | ICD-10-CM

## 2020-07-16 LAB — BASIC METABOLIC PANEL
BUN: 22 mg/dL (ref 6–23)
CO2: 34 mEq/L — ABNORMAL HIGH (ref 19–32)
Calcium: 9.4 mg/dL (ref 8.4–10.5)
Chloride: 98 mEq/L (ref 96–112)
Creatinine, Ser: 0.74 mg/dL (ref 0.40–1.20)
GFR: 75.93 mL/min (ref >60.00)
Glucose, Bld: 69 mg/dL — ABNORMAL LOW (ref 70–99)
Potassium: 3.6 mEq/L (ref 3.5–5.1)
Sodium: 139 mEq/L (ref 135–145)

## 2020-07-16 MED ORDER — TRIAMCINOLONE ACETONIDE 0.1 % EX CREA: TOPICAL_CREAM | CUTANEOUS | 0 refills | Status: DC

## 2020-07-16 MED ORDER — FUROSEMIDE 80 MG PO TABS: 80.0000 mg | ORAL_TABLET | Freq: Two times a day (BID) | ORAL | 6 refills | Status: DC

## 2020-07-16 MED ORDER — POTASSIUM CHLORIDE ER 10 MEQ PO TBCR: 20.0000 meq | EXTENDED_RELEASE_TABLET | Freq: Two times a day (BID) | ORAL | 0 refills | Status: DC

## 2020-07-16 MED ORDER — AMLODIPINE BESYLATE 10 MG PO TABS: 5.0000 mg | ORAL_TABLET | Freq: Every day | ORAL | 3 refills | Status: DC

## 2020-07-16 NOTE — Telephone Encounter (Signed)
There may have been a misunderstanding because I don't think I ordered home O2 for this patient. Her last visit was a telephone visit so we would not have ordered home O2  because we would not have been able to do the walk test. I think I have only ordered home O2 for one patient recently and it was not for Ms. Pownall. However, I do specifically remember talking with patient and her daughter about home health nurse. So if we haven't ordered that, can we go ahead and do so to help with medications and maybe check vitals once a week? Sorry for the confusion.  Thank you!

## 2020-07-16 NOTE — Progress Notes (Signed)
Subjective  CC:  Chief Complaint  Patient presents with  . Leg Swelling    Worse over the past week, c/o redness, hot to touch, and weeping    Same day acute visit; PCP not available. New pt to me. Chart reviewed.   HPI: [name] is a 80 y.o. female who presents to the office today to address the problems listed above in the chief complaint.  80 year old female with multiple medical problems including chronic respiratory failure, chronic diastolic heart failure, chronic A. fib on Eliquis, type 2 diabetes, hypertension presents due to increasing lower extremity edema.  Reviewed multiple records including most recent records from cardiology.  I reviewed most recent weights.  She reports her legs have been increasing in swelling of the last week or so.  They are now weeping.  There is more redness.  She has no pain or fevers.  No open wounds.  She admits that she misses morning doses of potassium and Lasix intermittently, at least once weekly.  Salt restriction is difficult as well.  She has no chest pain or palpitations.  No change in shortness of breath.  Had sleep study scheduled.  Wt Readings from Last 3 Encounters:  07/16/20 199 lb (90.3 kg)  07/10/20 198 lb 3.2 oz (89.9 kg)  06/17/20 194 lb (88 kg)   Lab Results  Component Value Date   HGBA1C 6.9 (H) 06/11/2020   HGBA1C 6.8 (H) 01/03/2020   HGBA1C 7.6 (H) 07/05/2019    Lab Results  Component Value Date   CREATININE 0.70 06/11/2020   BUN 14 06/11/2020   NA 138 06/11/2020   K 3.4 (L) 06/27/2020   CL 98 06/11/2020   CO2 32 06/11/2020      Assessment  1. Chronic diastolic congestive heart failure ([place])   2. Chronic respiratory failure with hypoxia and hypercapnia (HCC)   3. Venous insufficiency (chronic) (peripheral)   4. Venous stasis dermatitis of both lower extremities   5. Atrial fibrillation, unspecified type ([place])   6. Essential hypertension      Plan   Chronic venous insufficiency with venous stasis  dermatitis due to chronic diastolic heart failure: Mild exacerbation.  Unna boot placed on right, increase Lasix to 80 mg 3 times daily 2 days.  Reinforced need to take 20 mEq of potassium with each dose of Lasix.  See after visit summary for instructions.  Monitor weights.  Low-sodium diet reinforced.  Recheck next week.  Triamcinolone cream to left leg.  Unna boot to right leg.  Respiratory status stable  Chronic A. fib: Rate controlled  Hypertension extremity edema: On amlodipine 10 mg.  Decrease to 5 mg daily to see if helps.  Follow up: 1 week to recheck with pcp 08/29/2020  Orders Placed This Encounter  Procedures  . Basic metabolic panel   Meds ordered this encounter  Medications  . amLODipine (NORVASC) 10 MG tablet    Sig: Take 0.5 tablets (5 mg total) by mouth daily.    Dispense:  90 tablet    Refill:  3  . potassium chloride (KLOR-CON) 10 MEQ tablet    Sig: Take 2 tablets (20 mEq total) by mouth 2 (two) times daily. Take with lasix    Dispense:  270 tablet    Refill:  0  . furosemide (LASIX) 80 MG tablet    Sig: Take 1 tablet (80 mg total) by mouth 2 (two) times daily. Take with potassium    Dispense:  60 tablet  Refill:  6  . triamcinolone (KENALOG) 0.1 %    Sig: Apply to red areas on legs twice a day as needed    Dispense:  45 g    Refill:  0      I reviewed the patients updated PMH, FH, and SocHx.    Patient Active Problem List   Diagnosis Date Noted  . Educated about COVID-19 virus infection 11/30/2019  . Chronic respiratory failure with hypoxia and hypercapnia ([place]) 07/11/2019  . Acute on chronic diastolic CHF (congestive heart failure) ([place]) 07/08/2019  . Hypoxia 07/07/2019  . Hypokalemia 05/03/2019  . Facial asymmetry, acquired 08/20/2016  . Fracture of knee prosthesis ([place]) 08/01/2016  . Obesity 02/20/2016  . Chronic pain syndrome 02/19/2016  . Mild episode of recurrent major depressive disorder ([place]) 08/02/2015  . Chronic diastolic congestive  heart failure ([place]) 07/16/2015  . Type 2 diabetes mellitus with diabetic neuropathy ([place]) 09/27/2014  . Hearing loss - has hearing aides, follow by audiologist 10/11/2013  . No blood products ([place])  09/11/2013  . Disorder of bone and cartilage 06/10/2010  . Breast cancer of upper-outer quadrant of right female breast ([place]) 04/04/2010  . RESTLESS LEGS SYNDROME 05/05/2007  . Obstructive sleep apnea 03/01/2007  . Essential hypertension 01/27/2007  . Osteoarthritis 01/27/2007   Current Meds  Medication Sig  . acetaminophen (TYLENOL) 325 MG tablet Take 2 tablets (650 mg total) by mouth every 6 (six) hours as needed for mild pain (or Fever >/= 101).  Marland Kitchen albuterol (PROVENTIL) (2.5 MG/3ML) 0.083% nebulizer solution Take 3 mLs (2.5 mg total) by nebulization every 6 (six) hours as needed for wheezing or shortness of breath.  Marland Kitchen apixaban (ELIQUIS) 5 MG TABS tablet Take 1 tablet (5 mg total) by mouth 2 (two) times daily.  . carvedilol (COREG) 3.125 MG tablet TAKE 1 TABLET TWICE DAILY WITH MEALS  . DROPLET PEN NEEDLES 32G X 4 MM MISC USE AS DIRECTED ONE TIME DAILY  . LANTUS SOLOSTAR 100 UNIT/ML Solostar Pen INJECT 25 UNITS INTO THE SKIN AT BEDTIME.  Marland Kitchen lisinopril (ZESTRIL) 20 MG tablet TAKE 1 TABLET (20 MG TOTAL) BY MOUTH DAILY.  . metFORMIN (GLUCOPHAGE) 1000 MG tablet TAKE 1 TABLET TWICE DAILY  . pramipexole (MIRAPEX) 1 MG tablet TAKE 1 TABLET TWICE DAILY  . triamcinolone (KENALOG) 0.1 % Apply to red areas on legs twice a day as needed  . [DISCONTINUED] amLODipine (NORVASC) 10 MG tablet Take 1 tablet (10 mg total) by mouth daily.  . [DISCONTINUED] potassium chloride (KLOR-CON) 10 MEQ tablet TAKE 2 TABS IN THE MORNING AND 1 TAB AT NIGHT. (Patient taking differently: Take 2 tabs in the morning and 2 tab at night.)    Allergies: Patient is allergic to sulfa antibiotics, sulfonamide derivatives, and other. Family History: Patient family history includes Alcohol abuse in her father; Cancer in  her sister; Diabetes in an other family member; Heart disease in her brother and paternal grandfather; Obesity in an other family member. Social History:  Patient  reports that she has never smoked. She has never used smokeless tobacco. She reports that she does not drink alcohol and does not use drugs.  Review of Systems: Constitutional: Negative for fever malaise or anorexia Cardiovascular: negative for chest pain Respiratory: negative for SOB or persistent cough Gastrointestinal: negative for abdominal pain  Objective  Vitals: BP 118/76   Pulse 77   Temp (!) 97.5 F (36.4 C) (Temporal)   Wt 199 lb (90.3 kg)   SpO2 94%   BMI  37.60 kg/m  General: no acute distress , A&Ox3, no respiratory distress HEENT: PEERL, conjunctiva normal, neck is supple no JVD Cardiovascular: Irregularly irregular with gallop S3 Respiratory:  Good breath sounds bilaterally, CTAB with normal respiratory effort, no rales Bilateral lower extremity edema +2 to mid calf, erythematous and scaly to mid calf, serous weeping on right.  No cords no calf tenderness      Commons side effects, risks, benefits, and alternatives for medications and treatment plan prescribed today were discussed, and the patient expressed understanding of the given instructions. Patient is instructed to call or message via MyChart if he/she has any questions or concerns regarding our treatment plan. No barriers to understanding were identified. We discussed Red Flag symptoms and signs in detail. Patient expressed understanding regarding what to do in case of urgent or emergency type symptoms.   Medication list was reconciled, printed and provided to the patient in AVS. Patient instructions and summary information was reviewed with the patient as documented in the AVS. This note was prepared with assistance of Dragon voice recognition software. Occasional wrong-word or sound-a-like substitutions may have occurred due to the inherent  limitations of voice recognition software  This visit occurred during the SARS-CoV-2 public health emergency.  Safety protocols were in place, including screening questions prior to the visit, additional usage of staff PPE, and extensive cleaning of exam room while observing appropriate contact time as indicated for disinfecting solutions.

## 2020-07-16 NOTE — Patient Instructions (Addendum)
Please return in 1 week to see Alyssa to reassess your weight and legs.   Keep the wrapping on your right leg for the next week. Elevate your legs when you can.   Cut your amlodipine to 5mg  (1/2 tablet) daily.  Please take your lasix and potassium pills 3 times today and tomorrow: at 7am, noon, and 4 pm. Then you can return to taking them twice daily: at 7am and 2pm. Always take your potassium pills with your lasix. Do not miss these medications.   You may use triamcinolone cream to the red areas on your legs as needed. I have ordered this for you.   Diabetes Mellitus and Foot Care Foot care is an important part of your health, especially when you have diabetes. Diabetes may cause you to have problems because of poor blood flow (circulation) to your feet and legs, which can cause your skin to:  Become thinner and drier.  Break more easily.  Heal more slowly.  Peel and crack. You may also have nerve damage (neuropathy) in your legs and feet, causing decreased feeling in them. This means that you may not notice minor injuries to your feet that could lead to more serious problems. Noticing and addressing any potential problems early is the best way to prevent future foot problems. How to care for your feet Foot hygiene  Wash your feet daily with warm water and mild soap. Do not use hot water. Then, pat your feet and the areas between your toes until they are completely dry. Do not soak your feet as this can dry your skin.  Trim your toenails straight across. Do not dig under them or around the cuticle. File the edges of your nails with an emery board or nail file.  Apply a moisturizing lotion or petroleum jelly to the skin on your feet and to dry, brittle toenails. Use lotion that does not contain alcohol and is unscented. Do not apply lotion between your toes.   Shoes and socks  Wear clean socks or stockings every day. Make sure they are not too tight. Do not wear knee-high stockings  since they may decrease blood flow to your legs.  Wear shoes that fit properly and have enough cushioning. Always look in your shoes before you put them on to be sure there are no objects inside.  To break in new shoes, wear them for just a few hours a day. This prevents injuries on your feet. Wounds, scrapes, corns, and calluses  Check your feet daily for blisters, cuts, bruises, sores, and redness. If you cannot see the bottom of your feet, use a mirror or ask someone for help.  Do not cut corns or calluses or try to remove them with medicine.  If you find a minor scrape, cut, or break in the skin on your feet, keep it and the skin around it clean and dry. You may clean these areas with mild soap and water. Do not clean the area with peroxide, alcohol, or iodine.  If you have a wound, scrape, corn, or callus on your foot, look at it several times a day to make sure it is healing and not infected. Check for: ? Redness, swelling, or pain. ? Fluid or blood. ? Warmth. ? Pus or a bad smell.   General tips  Do not cross your legs. This may decrease blood flow to your feet.  Do not use heating pads or hot water bottles on your feet. They may burn your skin.  If you have lost feeling in your feet or legs, you may not know this is happening until it is too late.  Protect your feet from hot and cold by wearing shoes, such as at the beach or on hot pavement.  Schedule a complete foot exam at least once a year (annually) or more often if you have foot problems. Report any cuts, sores, or bruises to your health care provider immediately. Where to find more information  American Diabetes Association: www.diabetes.org  Association of Diabetes Care & Education Specialists: www.diabeteseducator.org Contact a health care provider if:  You have a medical condition that increases your risk of infection and you have any cuts, sores, or bruises on your feet.  You have an injury that is not  healing.  You have redness on your legs or feet.  You feel burning or tingling in your legs or feet.  You have pain or cramps in your legs and feet.  Your legs or feet are numb.  Your feet always feel cold.  You have pain around any toenails. Get help right away if:  You have a wound, scrape, corn, or callus on your foot and: ? You have pain, swelling, or redness that gets worse. ? You have fluid or blood coming from the wound, scrape, corn, or callus. ? Your wound, scrape, corn, or callus feels warm to the touch. ? You have pus or a bad smell coming from the wound, scrape, corn, or callus. ? You have a fever. ? You have a red line going up your leg. Summary  Check your feet every day for blisters, cuts, bruises, sores, and redness.  Apply a moisturizing lotion or petroleum jelly to the skin on your feet and to dry, brittle toenails.  Wear shoes that fit properly and have enough cushioning.  If you have foot problems, report any cuts, sores, or bruises to your health care provider immediately.  Schedule a complete foot exam at least once a year (annually) or more often if you have foot problems. This information is not intended to replace advice given to you by your health care provider. Make sure you discuss any questions you have with your health care provider. Document Revised: 11/23/2019 Document Reviewed: 11/23/2019 Elsevier Patient Education  [place].   If you have any questions or concerns, please don't hesitate to send me a message via MyChart or call the office at [phone]. Thank you for visiting with [place] today! It's our pleasure caring for you.

## 2020-07-18 NOTE — Telephone Encounter (Signed)
Callie I sent a message to [facility], this is the message I received back:  [facility] [facility],   Unfortunately [facility] will not be able to service this pt. at this time due to staffing.    Thanks       Abelina Bachelor  I have sent another message to [facility]. We will see what they say.

## 2020-07-18 NOTE — Telephone Encounter (Addendum)
Ok thank you so much.

## 2020-07-19 ENCOUNTER — Other Ambulatory Visit: Payer: Self-pay | Admitting: Cardiology

## 2020-07-24 ENCOUNTER — Encounter: Payer: Self-pay | Admitting: Physician Assistant

## 2020-07-24 ENCOUNTER — Other Ambulatory Visit: Payer: Self-pay

## 2020-07-24 ENCOUNTER — Ambulatory Visit (INDEPENDENT_AMBULATORY_CARE_PROVIDER_SITE_OTHER): Payer: Medicare HMO | Admitting: Physician Assistant

## 2020-07-24 VITALS — BP 143/95 | HR 62 | Temp 96.9°F | Ht 61.0 in | Wt 200.0 lb

## 2020-07-24 DIAGNOSIS — I5032 Chronic diastolic (congestive) heart failure: Secondary | ICD-10-CM

## 2020-07-24 DIAGNOSIS — I872 Venous insufficiency (chronic) (peripheral): Secondary | ICD-10-CM

## 2020-07-24 DIAGNOSIS — I4891 Unspecified atrial fibrillation: Secondary | ICD-10-CM | POA: Diagnosis not present

## 2020-07-24 DIAGNOSIS — J9612 Chronic respiratory failure with hypercapnia: Secondary | ICD-10-CM

## 2020-07-24 DIAGNOSIS — L03115 Cellulitis of right lower limb: Secondary | ICD-10-CM

## 2020-07-24 DIAGNOSIS — I10 Essential (primary) hypertension: Secondary | ICD-10-CM

## 2020-07-24 DIAGNOSIS — J9611 Chronic respiratory failure with hypoxia: Secondary | ICD-10-CM

## 2020-07-24 MED ORDER — CEPHALEXIN 500 MG PO CAPS: 500.0000 mg | ORAL_CAPSULE | Freq: Three times a day (TID) | ORAL | 0 refills | Status: AC

## 2020-07-24 NOTE — Patient Instructions (Addendum)
Please continue Lasix 80 mg one tab three times daily for the next week until you see me again.  Take a potassium tablet with each dose of Lasix.  KEEP YOUR LEGS ELEVATED. LIMIT YOUR SALT IN DIET (CUT OUT FROZEN PROCESSED FOODS AND CANNED GOODS).  Take the Cephalexin one tablet three times daily until you see me again to help against possible infection.  Please monitor your blood pressure at home once daily, same time daily, and bring a log to your next appointment.  IF YOU DEVELOP ANY SUDDEN CHEST PAIN, SHORTNESS OF BREATH, OR WORSENING SWELLING OR REDNESS, GO STRAIGHT TO THE ER!

## 2020-07-24 NOTE — Progress Notes (Signed)
Established Patient Office Visit  Subjective:  Patient ID: [name], female    DOB: April 29, 1941  Age: 80 y.o. MRN: [id number]  CC:  Chief Complaint  Patient presents with  . Follow-up    HPI [name] presents for recheck on her legs after visit with Dr. Jonni Sanger on 07-16-20. She is here with her husband today. Pt is a poor historian.  Her daughter, [name], joins [place] by phone call today as she is in [place] where she lives.  Patient states that she followed Dr. Tamela Oddi directions from last visit.  Overall she feels like there has not been much change in the swelling in her legs and if anything she feels like the right leg looks more red.  She has not removed the [employer]. She has been taking Lasix 80 mg TID for two days and then back to BID, with K+ tablet each dose. Pt did decrease amlodipine to 5 mg daily, but does not think this has helped the swelling at all. She does not check her blood pressure at home.  She denies any chest pain, shortness of breath, dizziness, or weakness.  Past Medical History:  Diagnosis Date  . AK (actinic keratosis)   . Anxiety    occasional  . Breast cancer ([place])    R breast, s/p radiation 34Gy/51fx 04/29/10-05/05/10  . CTS (carpal tunnel syndrome)    Left  . Diabetes ([place])   . Diastolic CHF ([place])    Dx w/ hospitalization 06/2015 for respiratory failure  . Facial asymmetry, acquired 08/20/2016   From injury  . Falls frequently    "can not pick left leg up"  . Hearing loss    bilateral hearing aides  . History of environmental allergies   . History of kidney stones 20 years ago  . Hypertension   . MDD (major depressive disorder)   . No blood products ([place])  09/11/2013  . Osteoarthritis    hx shoulder, knee, back, wrist pain; saw Dr. Wynelle Link   . Personal history of radiation therapy   . RESTLESS LEGS SYNDROME 05/05/2007   Qualifier: Diagnosis of  By: Gwenette Greet MD, Armando Reichert   . RLS (restless legs syndrome)   . Sleep apnea     Past  Surgical History:  Procedure Laterality Date  . APPENDECTOMY  age 30  . BREAST LUMPECTOMY Right 2012  . BREAST SURGERY  2012   rt breast lumpectomy  . flex signoidoscopy    . INCISION AND DRAINAGE PERIRECTAL ABSCESS    . JOINT REPLACEMENT Right 2006   knee  . TOTAL KNEE ARTHROPLASTY Left 08/06/2014   Procedure: LEFT TOTAL KNEE ARTHROPLASTY;  Surgeon: Gaynelle Arabian, MD;  Location: [facility];  Service: Orthopedics;  Laterality: Left;  . TUBAL LIGATION  1979    Family History  Problem Relation Age of Onset  . Alcohol abuse Father   . Cancer Sister        breast  . Heart disease Paternal Grandfather   . Diabetes Other   . Obesity Other   . Heart disease Brother        Valve    Social History   Socioeconomic History  . Marital status: Married    Spouse name: Not on file  . Number of children: 3  . Years of education: Not on file  . Highest education level: Not on file  Occupational History  . Not on file  Tobacco Use  . Smoking status: Never Smoker  . Smokeless  tobacco: Never Used  Substance and Sexual Activity  . Alcohol use: No  . Drug use: No  . Sexual activity: Yes  Other Topics Concern  . Not on file  Social History Narrative         Home Situation: lives with husband and granddaughter      Spiritual Beliefs: Jehovah Witness - no blood products               Social Determinants of Health   Financial Resource Strain: Not on file  Food Insecurity: No Food Insecurity  . Worried About Charity fundraiser in the Last Year: Never true  . Ran Out of Food in the Last Year: Never true  Transportation Needs: No Transportation Needs  . Lack of Transportation (Medical): No  . Lack of Transportation (Non-Medical): No  Physical Activity: Not on file  Stress: Not on file  Social Connections: Not on file  Intimate Partner Violence: Not on file    Outpatient Medications Prior to Visit  Medication Sig Dispense Refill  . acetaminophen (TYLENOL) 325 MG tablet Take 2  tablets (650 mg total) by mouth every 6 (six) hours as needed for mild pain (or Fever >/= 101). 12 tablet 0  . albuterol (PROVENTIL) (2.5 MG/3ML) 0.083% nebulizer solution Take 3 mLs (2.5 mg total) by nebulization every 6 (six) hours as needed for wheezing or shortness of breath. 75 mL 12  . amLODipine (NORVASC) 10 MG tablet Take 0.5 tablets (5 mg total) by mouth daily. 90 tablet 3  . apixaban (ELIQUIS) 5 MG TABS tablet Take 1 tablet (5 mg total) by mouth 2 (two) times daily. 60 tablet 11  . carvedilol (COREG) 3.125 MG tablet TAKE 1 TABLET TWICE DAILY WITH MEALS 180 tablet 1  . DROPLET PEN NEEDLES 32G X 4 MM MISC USE AS DIRECTED ONE TIME DAILY 100 each 1  . furosemide (LASIX) 80 MG tablet TAKE 1 TABLET BY MOUTH 2 TIMES DAILY. 180 tablet 3  . LANTUS SOLOSTAR 100 UNIT/ML Solostar Pen INJECT 25 UNITS INTO THE SKIN AT BEDTIME. 30 mL 2  . lisinopril (ZESTRIL) 20 MG tablet TAKE 1 TABLET (20 MG TOTAL) BY MOUTH DAILY. 90 tablet 2  . metFORMIN (GLUCOPHAGE) 1000 MG tablet TAKE 1 TABLET TWICE DAILY 180 tablet 1  . potassium chloride (KLOR-CON) 10 MEQ tablet Take 2 tablets (20 mEq total) by mouth 2 (two) times daily. Take with lasix 270 tablet 0  . pramipexole (MIRAPEX) 1 MG tablet TAKE 1 TABLET TWICE DAILY 180 tablet 1  . triamcinolone (KENALOG) 0.1 % Apply to red areas on legs twice a day as needed 45 g 0   No facility-administered medications prior to visit.    Allergies  Allergen Reactions  . Sulfa Antibiotics Swelling    Other reaction(s): Facial Swelling "swelling lips"   . Sulfonamide Derivatives Swelling    Lips Swelling  . Other     BLOOD PRODUCT REFUSAL    ROS Review of Systems REFER TO HPI FOR PERTINENT POSITIVES AND NEGATIVES    Objective:    Physical Exam Vitals and nursing note reviewed.  Constitutional:      General: She is not in acute distress.    Appearance: She is obese.  Cardiovascular:     Rate and Rhythm: Rhythm irregular.     Heart sounds: No murmur  heard.   Pulmonary:     Effort: Pulmonary effort is normal. No respiratory distress.     Breath sounds: Normal breath sounds. No stridor.  No wheezing or rales.  Musculoskeletal:     Right lower leg: 2+ Edema (+erythema; Homan's sign negative; some overlying scaling but no weeping) present.     Left lower leg: 1+ Edema present.  Neurological:     Mental Status: Mental status is at baseline.  Psychiatric:        Mood and Affect: Mood normal.     BP (!) 143/95   Pulse 62   Temp (!) 96.9 F (36.1 C)   Ht 5\' 1"  (1.549 m)   Wt 200 lb (90.7 kg)   SpO2 92%   BMI 37.79 kg/m  Wt Readings from Last 3 Encounters:  07/24/20 200 lb (90.7 kg)  07/16/20 199 lb (90.3 kg)  07/10/20 198 lb 3.2 oz (89.9 kg)   BP Readings from Last 3 Encounters:  07/24/20 (!) 143/95  07/16/20 118/76  07/10/20 117/80     Health Maintenance Due  Topic Date Due  . OPHTHALMOLOGY EXAM  12/20/2019  . FOOT EXAM  01/24/2020    There are no preventive care reminders to display for this patient.  Lab Results  Component Value Date   TSH 0.902 07/08/2019   Lab Results  Component Value Date   WBC 10.3 05/27/2020   HGB 14.3 05/27/2020   HCT 42.4 05/27/2020   MCV 95 05/27/2020   PLT 269 05/27/2020   Lab Results  Component Value Date   NA 139 07/16/2020   K 3.6 07/16/2020   CHLORIDE 101 01/25/2014   CO2 34 (H) 07/16/2020   GLUCOSE 69 (L) 07/16/2020   BUN 22 07/16/2020   CREATININE 0.74 07/16/2020   BILITOT 0.6 06/11/2020   ALKPHOS 102 06/11/2020   AST 18 06/11/2020   ALT 13 06/11/2020   PROT 7.0 06/11/2020   ALBUMIN 3.7 06/11/2020   CALCIUM 9.4 07/16/2020   ANIONGAP 8 07/10/2019   GFR 75.93 07/16/2020   Lab Results  Component Value Date   CHOL 101 06/11/2020   Lab Results  Component Value Date   HDL 31.40 (L) 06/11/2020   Lab Results  Component Value Date   LDLCALC 51 06/11/2020   Lab Results  Component Value Date   TRIG 95.0 06/11/2020   Lab Results  Component Value Date    CHOLHDL 3 06/11/2020   Lab Results  Component Value Date   HGBA1C 6.9 (H) 06/11/2020      Assessment & Plan:   Problem List Items Addressed This Visit      Cardiovascular and Mediastinum   Essential hypertension   Chronic diastolic congestive heart failure (HCC) - Primary     Respiratory   Chronic respiratory failure with hypoxia and hypercapnia (HCC)    Other Visit Diagnoses    Venous stasis dermatitis of both lower extremities       Venous insufficiency (chronic) (peripheral)       Atrial fibrillation, unspecified type ([place])       Cellulitis of right lower leg          Meds ordered this encounter  Medications  . cephALEXin (KEFLEX) 500 MG capsule    Sig: Take 1 capsule (500 mg total) by mouth 3 (three) times daily for 7 days.    Dispense:  21 capsule    Refill:  0    Follow-up: Return in about 1 week (around 07/31/2020) for recheck legs.   1. Chronic diastolic congestive heart failure (HCC) 2. Venous stasis dermatitis of both lower extremities 3. Chronic respiratory failure with hypoxia and hypercapnia (HCC) 4. Venous  insufficiency (chronic) (peripheral) She does not appear to be in any acute distress today.  She will continue to take Lasix 80 mg 1 tablet 3 times a daily for the next week.  She is going to take potassium tablet with each dose of the Lasix.  She is going to monitor her weight and call me if there is any increase over 3 pounds in 24 hours.  Instructed her to keep her legs elevated and to cut her salt out of her diet.  She may use the triamcinolone cream as needed.  5. Atrial fibrillation, unspecified type ([place]) She is rate controlled.  Stable on Eliquis and carvedilol.  6. Essential hypertension Recheck of her blood pressure today was 120/70.  I am going to have her continue taking amlodipine 5 mg 1 tablet daily and try to monitor at home.  I would like for her to bring a log of her pressures next week.  7. Cellulitis of right lower leg As I did not  see her leg last week, I am concerned about the erythema in the right lower leg.  I am going to start her on cephalexin in case of any underlying infection.    Patient, husband, and daughter all understanding and agreement of plan discussed today.  For any acute changes such as increased redness pain or swelling in the lower legs or any chest pain or shortness of breath, she knows to go straight to the emergency department.  This note was prepared with assistance of Systems analyst. Occasional wrong-word or sound-a-like substitutions may have occurred due to the inherent limitations of voice recognition software.  This visit occurred during the SARS-CoV-2 public health emergency.  Safety protocols were in place, including screening questions prior to the visit, additional usage of staff PPE, and extensive cleaning of exam room while observing appropriate contact time as indicated for disinfecting solutions.    Malayzia Laforte M [place] Leap, PA-C

## 2020-07-29 NOTE — Addendum Note (Signed)
Addended by: Waylan Rocher on: 07/29/2020 08:35 AM   Modules accepted: Orders

## 2020-07-30 DIAGNOSIS — G4733 Obstructive sleep apnea (adult) (pediatric): Secondary | ICD-10-CM | POA: Diagnosis not present

## 2020-07-30 DIAGNOSIS — R062 Wheezing: Secondary | ICD-10-CM | POA: Diagnosis not present

## 2020-07-30 DIAGNOSIS — I5032 Chronic diastolic (congestive) heart failure: Secondary | ICD-10-CM | POA: Diagnosis not present

## 2020-07-31 ENCOUNTER — Ambulatory Visit (INDEPENDENT_AMBULATORY_CARE_PROVIDER_SITE_OTHER): Payer: Medicare HMO | Admitting: Physician Assistant

## 2020-07-31 ENCOUNTER — Other Ambulatory Visit: Payer: Self-pay

## 2020-07-31 ENCOUNTER — Encounter: Payer: Self-pay | Admitting: Physician Assistant

## 2020-07-31 VITALS — BP 120/72 | HR 68 | Temp 97.2°F | Ht 61.0 in | Wt 196.2 lb

## 2020-07-31 DIAGNOSIS — I5032 Chronic diastolic (congestive) heart failure: Secondary | ICD-10-CM | POA: Diagnosis not present

## 2020-07-31 DIAGNOSIS — I10 Essential (primary) hypertension: Secondary | ICD-10-CM

## 2020-07-31 DIAGNOSIS — J9611 Chronic respiratory failure with hypoxia: Secondary | ICD-10-CM | POA: Diagnosis not present

## 2020-07-31 DIAGNOSIS — J9612 Chronic respiratory failure with hypercapnia: Secondary | ICD-10-CM | POA: Diagnosis not present

## 2020-07-31 DIAGNOSIS — I4891 Unspecified atrial fibrillation: Secondary | ICD-10-CM

## 2020-07-31 DIAGNOSIS — I872 Venous insufficiency (chronic) (peripheral): Secondary | ICD-10-CM

## 2020-07-31 NOTE — Patient Instructions (Addendum)
Go to the lab today and I will call with results and further instructions about the Lasix. Stop the Amlodipine and monitor blood pressure at home. Unna boot placed today. We will take this off and recheck in one week. Continue to monitor your weight daily.   Low-Sodium Eating Plan Sodium, which is an element that makes up salt, helps you maintain a healthy balance of fluids in your body. Too much sodium can increase your blood pressure and cause fluid and waste to be held in your body. Your health care provider or dietitian may recommend following this plan if you have high blood pressure (hypertension), kidney disease, liver disease, or heart failure. Eating less sodium can help lower your blood pressure, reduce swelling, and protect your heart, liver, and kidneys. What are tips for following this plan? Reading food labels  The Nutrition Facts label lists the amount of sodium in one serving of the food. If you eat more than one serving, you must multiply the listed amount of sodium by the number of servings.  Choose foods with less than 140 mg of sodium per serving.  Avoid foods with 300 mg of sodium or more per serving. Shopping  Look for lower-sodium products, often labeled as "low-sodium" or "no salt added."  Always check the sodium content, even if foods are labeled as "unsalted" or "no salt added."  Buy fresh foods. ? Avoid canned foods and pre-made or frozen meals. ? Avoid canned, cured, or processed meats.  Buy breads that have less than 80 mg of sodium per slice.   Cooking  Eat more home-cooked food and less restaurant, buffet, and fast food.  Avoid adding salt when cooking. Use salt-free seasonings or herbs instead of table salt or sea salt. Check with your health care provider or pharmacist before using salt substitutes.  Cook with plant-based oils, such as canola, sunflower, or olive oil.   Meal planning  When eating at a restaurant, ask that your food be prepared  with less salt or no salt, if possible. Avoid dishes labeled as brined, pickled, cured, smoked, or made with soy sauce, miso, or teriyaki sauce.  Avoid foods that contain MSG (monosodium glutamate). MSG is sometimes added to [place] food, bouillon, and some canned foods.  Make meals that can be grilled, baked, poached, roasted, or steamed. These are generally made with less sodium. General information Most people on this plan should limit their sodium intake to 1,500-2,000 mg (milligrams) of sodium each day. What foods should I eat? Fruits Fresh, frozen, or canned fruit. Fruit juice. Vegetables Fresh or frozen vegetables. "No salt added" canned vegetables. "No salt added" tomato sauce and paste. Low-sodium or reduced-sodium tomato and vegetable juice. Grains Low-sodium cereals, including oats, puffed wheat and rice, and shredded wheat. Low-sodium crackers. Unsalted rice. Unsalted pasta. Low-sodium bread. Whole-grain breads and whole-grain pasta. Meats and other proteins Fresh or frozen (no salt added) meat, poultry, seafood, and fish. Low-sodium canned tuna and salmon. Unsalted nuts. Dried peas, beans, and lentils without added salt. Unsalted canned beans. Eggs. Unsalted nut butters. Dairy Milk. Soy milk. Cheese that is naturally low in sodium, such as ricotta cheese, fresh mozzarella, or Swiss cheese. Low-sodium or reduced-sodium cheese. Cream cheese. Yogurt. Seasonings and condiments Fresh and dried herbs and spices. Salt-free seasonings. Low-sodium mustard and ketchup. Sodium-free salad dressing. Sodium-free light mayonnaise. Fresh or refrigerated horseradish. Lemon juice. Vinegar. Other foods Homemade, reduced-sodium, or low-sodium soups. Unsalted popcorn and pretzels. Low-salt or salt-free chips. The items listed above may not  be a complete list of foods and beverages you can eat. Contact a dietitian for more information. What foods should I avoid? Vegetables Sauerkraut, pickled  vegetables, and relishes. Olives. [place] fries. Onion rings. Regular canned vegetables (not low-sodium or reduced-sodium). Regular canned tomato sauce and paste (not low-sodium or reduced-sodium). Regular tomato and vegetable juice (not low-sodium or reduced-sodium). Frozen vegetables in sauces. Grains Instant hot cereals. Bread stuffing, pancake, and biscuit mixes. Croutons. Seasoned rice or pasta mixes. Noodle soup cups. Boxed or frozen macaroni and cheese. Regular salted crackers. Self-rising flour. Meats and other proteins Meat or fish that is salted, canned, smoked, spiced, or pickled. Precooked or cured meat, such as sausages or meat loaves. Berniece Salines. Ham. Pepperoni. Hot dogs. Corned beef. Chipped beef. Salt pork. Jerky. Pickled herring. Anchovies and sardines. Regular canned tuna. Salted nuts. Dairy Processed cheese and cheese spreads. Hard cheeses. Cheese curds. Blue cheese. Feta cheese. String cheese. Regular cottage cheese. Buttermilk. Canned milk. Fats and oils Salted butter. Regular margarine. Ghee. Bacon fat. Seasonings and condiments Onion salt, garlic salt, seasoned salt, table salt, and sea salt. Canned and packaged gravies. Worcestershire sauce. Tartar sauce. Barbecue sauce. Teriyaki sauce. Soy sauce, including reduced-sodium. Steak sauce. Fish sauce. Oyster sauce. Cocktail sauce. Horseradish that you find on the shelf. Regular ketchup and mustard. Meat flavorings and tenderizers. Bouillon cubes. Hot sauce. Pre-made or packaged marinades. Pre-made or packaged taco seasonings. Relishes. Regular salad dressings. Salsa. Other foods Salted popcorn and pretzels. Corn chips and puffs. Potato and tortilla chips. Canned or dried soups. Pizza. Frozen entrees and pot pies. The items listed above may not be a complete list of foods and beverages you should avoid. Contact a dietitian for more information. Summary  Eating less sodium can help lower your blood pressure, reduce swelling, and protect  your heart, liver, and kidneys.  Most people on this plan should limit their sodium intake to 1,500-2,000 mg (milligrams) of sodium each day.  Canned, boxed, and frozen foods are high in sodium. Restaurant foods, fast foods, and pizza are also very high in sodium. You also get sodium by adding salt to food.  Try to cook at home, eat more fresh fruits and vegetables, and eat less fast food and canned, processed, or prepared foods. This information is not intended to replace advice given to you by your health care provider. Make sure you discuss any questions you have with your health care provider. Document Revised: 06/09/2019 Document Reviewed: 04/05/2019 Elsevier Patient Education  2021 [employer].

## 2020-07-31 NOTE — Progress Notes (Signed)
Acute Office Visit  Subjective:    Patient ID: [name], female    DOB: 1940-08-30, 80 y.o.   MRN: [id number]  Chief Complaint  Patient presents with  . Follow-up    HPI Patient is in today for recheck of lower extremity edema. States that she took medications as directed from last visit.  She has not noticed much improvement.  States that her husband took her to her for fast food last night and she had [place] fries and now feels more swollen in her legs today.  She denies any shortness of breath or chest pain still.  She denies any pain in her legs.  Past Medical History:  Diagnosis Date  . AK (actinic keratosis)   . Anxiety    occasional  . Breast cancer ([place])    R breast, s/p radiation 34Gy/65fx 04/29/10-05/05/10  . CTS (carpal tunnel syndrome)    Left  . Diabetes ([place])   . Diastolic CHF ([place])    Dx w/ hospitalization 06/2015 for respiratory failure  . Facial asymmetry, acquired 08/20/2016   From injury  . Falls frequently    "can not pick left leg up"  . Hearing loss    bilateral hearing aides  . History of environmental allergies   . History of kidney stones 20 years ago  . Hypertension   . MDD (major depressive disorder)   . No blood products ([place])  09/11/2013  . Osteoarthritis    hx shoulder, knee, back, wrist pain; saw Dr. Wynelle Link   . Personal history of radiation therapy   . RESTLESS LEGS SYNDROME 05/05/2007   Qualifier: Diagnosis of  By: Gwenette Greet MD, Armando Reichert   . RLS (restless legs syndrome)   . Sleep apnea     Past Surgical History:  Procedure Laterality Date  . APPENDECTOMY  age 66  . BREAST LUMPECTOMY Right 2012  . BREAST SURGERY  2012   rt breast lumpectomy  . flex signoidoscopy    . INCISION AND DRAINAGE PERIRECTAL ABSCESS    . JOINT REPLACEMENT Right 2006   knee  . TOTAL KNEE ARTHROPLASTY Left 08/06/2014   Procedure: LEFT TOTAL KNEE ARTHROPLASTY;  Surgeon: Gaynelle Arabian, MD;  Location: [facility];  Service: Orthopedics;  Laterality:  Left;  . TUBAL LIGATION  1979    Family History  Problem Relation Age of Onset  . Alcohol abuse Father   . Cancer Sister        breast  . Heart disease Paternal Grandfather   . Diabetes Other   . Obesity Other   . Heart disease Brother        Valve    Social History   Socioeconomic History  . Marital status: Married    Spouse name: Not on file  . Number of children: 3  . Years of education: Not on file  . Highest education level: Not on file  Occupational History  . Not on file  Tobacco Use  . Smoking status: Never Smoker  . Smokeless tobacco: Never Used  Substance and Sexual Activity  . Alcohol use: No  . Drug use: No  . Sexual activity: Yes  Other Topics Concern  . Not on file  Social History Narrative         Home Situation: lives with husband and granddaughter      Spiritual Beliefs: [place] - no blood products               Social Determinants of Health  Financial Resource Strain: Not on file  Food Insecurity: No Food Insecurity  . Worried About Charity fundraiser in the Last Year: Never true  . Ran Out of Food in the Last Year: Never true  Transportation Needs: No Transportation Needs  . Lack of Transportation (Medical): No  . Lack of Transportation (Non-Medical): No  Physical Activity: Not on file  Stress: Not on file  Social Connections: Not on file  Intimate Partner Violence: Not on file    Outpatient Medications Prior to Visit  Medication Sig Dispense Refill  . amLODipine (NORVASC) 10 MG tablet Take 0.5 tablets (5 mg total) by mouth daily. 90 tablet 3  . apixaban (ELIQUIS) 5 MG TABS tablet Take 1 tablet (5 mg total) by mouth 2 (two) times daily. 60 tablet 11  . Ascorbic Acid (VITAMIN C PO) Take 2 tablets by mouth daily.    . Calcium Citrate-Vitamin D (CALCIUM + D PO) Take 2 tablets by mouth daily.    . carvedilol (COREG) 3.125 MG tablet TAKE 1 TABLET TWICE DAILY WITH MEALS (Patient taking differently: Take 3.125 mg by mouth 2  (two) times daily with a meal.) 180 tablet 1  . cephALEXin (KEFLEX) 500 MG capsule Take 1 capsule (500 mg total) by mouth 3 (three) times daily for 7 days. 21 capsule 0  . Cholecalciferol (VITAMIN D3) 50 MCG (2000 UT) CHEW Chew 4,000 Units by mouth daily.    . Cyanocobalamin (B-12 PO) Take 2 tablets by mouth daily.    . DROPLET PEN NEEDLES 32G X 4 MM MISC USE AS DIRECTED ONE TIME DAILY 100 each 1  . furosemide (LASIX) 80 MG tablet TAKE 1 TABLET BY MOUTH 2 TIMES DAILY. (Patient taking differently: Take 80 mg by mouth 3 (three) times daily.) 180 tablet 3  . HYDROcodone-acetaminophen (NORCO/VICODIN) 5-325 MG tablet Take 1 tablet by mouth 2 (two) times daily as needed for moderate pain.    Marland Kitchen LANTUS SOLOSTAR 100 UNIT/ML Solostar Pen INJECT 25 UNITS INTO THE SKIN AT BEDTIME. 30 mL 2  . lisinopril (ZESTRIL) 20 MG tablet TAKE 1 TABLET (20 MG TOTAL) BY MOUTH DAILY. 90 tablet 2  . metFORMIN (GLUCOPHAGE) 1000 MG tablet TAKE 1 TABLET TWICE DAILY (Patient taking differently: Take 1,000 mg by mouth in the morning and at bedtime.) 180 tablet 1  . potassium chloride (KLOR-CON) 10 MEQ tablet Take 2 tablets (20 mEq total) by mouth 2 (two) times daily. Take with lasix (Patient taking differently: Take 20 mEq by mouth 3 (three) times daily. Take with lasix) 270 tablet 0  . pramipexole (MIRAPEX) 1 MG tablet TAKE 1 TABLET TWICE DAILY (Patient taking differently: Take 1 mg by mouth 2 (two) times daily.) 180 tablet 1  . triamcinolone (KENALOG) 0.1 % Apply to red areas on legs twice a day as needed (Patient taking differently: Apply 1 application topically 2 (two) times daily as needed (redness). Apply to red areas on legs) 45 g 0   No facility-administered medications prior to visit.    Allergies  Allergen Reactions  . Sulfa Antibiotics Swelling     Facial Swelling "swelling lips"   . Sulfonamide Derivatives Swelling    Lips Swelling  . Other     BLOOD PRODUCT REFUSAL    Review of Systems  Constitutional:  Negative for activity change and unexpected weight change.  Respiratory: Negative for shortness of breath, wheezing and stridor.   Cardiovascular: Positive for leg swelling. Negative for chest pain and palpitations.  Gastrointestinal: Negative for nausea and  vomiting.  Musculoskeletal: Negative for arthralgias and joint swelling.  Skin: Negative for rash and wound.  Neurological: Negative for dizziness and headaches.       Objective:    Physical Exam Vitals and nursing note reviewed.  Constitutional:      General: She is not in acute distress.    Appearance: She is obese.  Cardiovascular:     Rate and Rhythm: Rhythm irregular.     Heart sounds: No murmur heard.   Pulmonary:     Effort: Pulmonary effort is normal. No respiratory distress.     Breath sounds: Normal breath sounds. No stridor. No wheezing or rales.  Musculoskeletal:     Right lower leg: 2+ Edema (+erythema; Homan's sign negative; some overlying scaling and today clear weeping from one location anterior lower leg) present.     Left lower leg: 1+ Edema present.  Neurological:     Mental Status: Mental status is at baseline.  Psychiatric:        Mood and Affect: Mood normal.     BP 120/72   Pulse 68   Temp (!) 97.2 F (36.2 C)   Ht 5\' 1"  (1.549 m)   Wt 196 lb 3.2 oz (89 kg)   SpO2 92%   BMI 37.07 kg/m  Wt Readings from Last 3 Encounters:  07/31/20 196 lb 3.2 oz (89 kg)  07/24/20 200 lb (90.7 kg)  07/16/20 199 lb (90.3 kg)   BP Readings from Last 3 Encounters:  07/31/20 120/72  07/24/20 (!) 143/95  07/16/20 118/76       Assessment & Plan:   Problem List Items Addressed This Visit      Cardiovascular and Mediastinum   Essential hypertension   Relevant Orders   Basic metabolic panel   Chronic diastolic congestive heart failure ([place]) - Primary   Relevant Orders   Basic metabolic panel     Respiratory   Chronic respiratory failure with hypoxia and hypercapnia ([facility])   Relevant Orders   Basic  metabolic panel    Other Visit Diagnoses    Venous stasis dermatitis of both lower extremities       Relevant Orders   Basic metabolic panel   Venous insufficiency (chronic) (peripheral)       Relevant Orders   Basic metabolic panel   Atrial fibrillation, unspecified type ([place] )       Relevant Orders   Basic metabolic panel     Patient has been compliant with medication regimen, however she still remains swollen in her lower extremities.  Today she has weeping from her right lower extremity again.  She is at high risk for venous stasis wounds as I discussed with her today.  Another Unna boot was placed today and she tolerated this well.  I will recheck and take this off next week.  We do need to check kidney function today as she has been on a higher dose of Lasix, as well as her potassium level.  It is likely that we will need to continue to increase her Lasix if she is tolerating this well.  Amlodipine will be stopped completely to see if this will also help reduce the swelling.  Compression stockings would be advised but this is not an option for patient or her husband to be able to put on her.  Low-salt diet highly recommended.  Discussed this plan with Dr. Jonni Sanger who agrees with aforementioned.  This note was prepared with assistance of Systems analyst. Occasional wrong-word or  sound-a-like substitutions may have occurred due to the inherent limitations of voice recognition software.   Mccall Lomax M Ryane Konieczny, PA-C

## 2020-08-01 LAB — BASIC METABOLIC PANEL
BUN: 20 mg/dL (ref 6–23)
CO2: 33 mEq/L — ABNORMAL HIGH (ref 19–32)
Calcium: 9.6 mg/dL (ref 8.4–10.5)
Chloride: 98 mEq/L (ref 96–112)
Creatinine, Ser: 0.83 mg/dL (ref 0.40–1.20)
GFR: 66.14 mL/min (ref >60.00)
Glucose, Bld: 80 mg/dL (ref 70–99)
Potassium: 4 mEq/L (ref 3.5–5.1)
Sodium: 142 mEq/L (ref 135–145)

## 2020-08-01 NOTE — H&P (Signed)
Surgical History & Physical  Patient Name: [name] DOB: [birth year]  Surgery: Cataract extraction with intraocular lens implant phacoemulsification; Left Eye  Surgeon: Baruch Goldmann MD Surgery Date:  08/12/2020 Pre-Op Date:  07/29/2020  HPI: A 50 Yr. old female patient Pt referred by Dr. Jorja Loa to cataract re-evaluation (last eval Feb 2021) The patient complains of difficulty when viewing TV, reading closed caption, news scrolls on TV, which began years ago. Both eyes are affected. The condition's severity is worsening. The condition is worse with daily activities. Symptoms are negatively affecting pt's quality of life. Pt does not use any eye drops currently. The patient experiences no flashes, floater, shadow, curtain or veil. A1C%-6.8 (November 21) BS- unknown-does not check daily/weekly HPI was performed by Baruch Goldmann .  Medical History: Cataracts Myopia Retinal tear without Detachment OU PVD OU Cancer Diabetes High Blood Pressure LDL Sleep apnea  Review of Systems Negative Allergic/Immunologic Negative Cardiovascular Negative Constitutional Negative Ear, Nose, Mouth & Throat Negative Endocrine Negative Eyes Negative Gastrointestinal Negative Genitourinary Negative Hemotologic/Lymphatic Negative Integumentary Negative Musculoskeletal Negative Neurological Negative Psychiatry Negative Respiratory  Social   Never smoked  Medication Amlodipine Besylate, Metformin, Lisinopril-HCTZ, Carvedilol, Pramipexole, Potassium chloride ER, Amlodipine, Furosemide,   Sx/Procedures Retina Laser,  Knee Surgery, Appendectomy,   Drug Allergies  Sulfa, Blood thinners,   History & Physical: Heent:  Cataract, Left eye NECK: supple without bruits LUNGS: lungs clear to auscultation CV: regular rate and rhythm Abdomen: soft and non-tender  Impression & Plan: Assessment: 1.  COMBINED FORMS AGE RELATED CATARACT; Both Eyes (H25.813) 2.  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  DERMATOCHALASIS; Right Upper Lid, Left Upper Lid (H02.831, H02.834) 4.  BLEPHARITIS; Right Upper Lid, Right Lower Lid, Left Upper Lid, Left Lower Lid (H01.001, H01.002,H01.004,H01.005)  Plan: 1.  Cataract accounts for the patient's decreased vision. This visual impairment is not correctable with a tolerable change in glasses or contact lenses. Cataract surgery with an implantation of a new lens should significantly improve the visual and functional status of the patient. Discussed all risks, benefits, alternatives, and potential complications. Discussed the procedures and recovery. Patient desires to have surgery. A-scan ordered and performed today for intra-ocular lens calculations. The surgery will be performed in order to improve vision for driving, reading, and for eye examinations. Recommend phacoemulsification with intra-ocular lens. Left Eye worse - first. Dilates well - shugarcaine by protocol. Consider Vivity lens.  2.  Stable. Recommend regular eye exams. Recommend good blood sugar and blood pressure control.  3.  Asymptomatic, recommend observation for now. Findings, prognosis and treatment options reviewed.  4.  regular lid cleaning.

## 2020-08-02 NOTE — Progress Notes (Signed)
Notified patient of lab results.Patient voices understanding.  

## 2020-08-05 DIAGNOSIS — H25812 Combined forms of age-related cataract, left eye: Secondary | ICD-10-CM | POA: Diagnosis not present

## 2020-08-06 ENCOUNTER — Encounter (HOSPITAL_COMMUNITY): Payer: Self-pay

## 2020-08-06 ENCOUNTER — Other Ambulatory Visit: Payer: Self-pay

## 2020-08-06 ENCOUNTER — Encounter (HOSPITAL_COMMUNITY)
Admission: RE | Admit: 2020-08-06 | Discharge: 2020-08-06 | Disposition: A | Discharge status: Home / Self Care | Payer: Medicare HMO | Source: Ambulatory Visit | Attending: Ophthalmology | Admitting: Ophthalmology

## 2020-08-06 NOTE — Patient Instructions (Signed)
[name]  08/06/2020     @PREFPERIOPPHARMACY @   Your procedure is scheduled on 08/12/2020.  Report to [facility] at 9:30 A.M.  Call this number if you have problems the morning of surgery:  [phone]   Remember:  Do not eat or drink after midnight.      Take these medicines the morning of surgery with A SIP OF WATER : Amlodipine, Carvedilol, Hydrocodone and Mirapex.      Please do not take any diabetic medications the am of surgery.  Only take12.5 units of Lantus the night before surgery.    Do not wear jewelry, make-up or nail polish.  Do not wear lotions, powders, or perfumes, or deodorant.  Do not shave 48 hours prior to surgery.  Men may shave face and neck.  Do not bring valuables to the hospital.  [facility] is not responsible for any belongings or valuables.  Contacts, dentures or bridgework may not be worn into surgery.  Leave your suitcase in the car.  After surgery it may be brought to your room.  For patients admitted to the hospital, discharge time will be determined by your treatment team.  Patients discharged the day of surgery will not be allowed to drive home.   Name and phone number of your driver:   family Special instructions:  N/A  Please read over the following fact sheets that you were given. Care and Recovery After Surgery     Cataract Surgery Cataract surgery is a procedure to remove a cloudy lens (cataract) in the eye. The lens focuses light inside the eye. When a lens becomes cloudy, your vision is affected. Another lens (intraocular lens or IOL) is usually inserted to replace the cloudy lens and to properly focus light in the eye. Cataract surgery is usually recommended when the cataract is causing problems with daily functioning, such as reading, watching television, or driving. Tell a health care provider about:  Any allergies you have.  All medicines you are taking, including vitamins, herbs, eye drops, creams, and  over-the-counter medicines.  Any problems you or family members have had with anesthetic medicines.  Any blood disorders you have.  Any surgeries you have had, especially eye surgeries that include refractive surgery, such as PRK and LASIK.  Any medical conditions you have.  Whether you are pregnant or may be pregnant. What are the risks? Generally, this is a safe procedure. However, problems may occur, including:  Infection.  Bleeding.  High pressure in the eye (glaucoma).  Detachment of the retina.  Corneal swelling.  Allergic reactions to medicines.  Damage to other structures or organs.  Inflammation of the eye.  Clouding of the part of your eye that holds an IOL in place (after-cataract). This is common and can be treated at a later date with laser surgery.  An IOL moving out of position (rare).  Loss of vision (rare). What happens before the procedure? Staying hydrated Follow instructions from your health care provider about hydration, which may include:  Up to 2 to 6 hours before the procedure - you may continue to drink clear liquids, such as water, clear fruit juice, black coffee, and plain tea. Exact instructions will be given by your health care provider.   Eating and drinking restrictions Follow instructions from your health care provider about eating and drinking, which may include:  8 hours before the procedure - stop eating heavy meals or foods, such as meat, fried foods, or fatty foods.  6-8  hours before the procedure - stop eating light meals or foods, such as toast or cereal.  6-8 hours before the procedure - stop drinking milk or drinks that contain milk.  2-6 hours before the procedure - stop drinking clear liquids. Medicines Ask your health care provider about:  Changing or stopping your regular medicines. This is especially important if you are taking diabetes medicines or blood thinners.  Taking medicines such as aspirin and ibuprofen.  These medicines can thin your blood. Do not take these medicines unless your health care provider tells you to take them.  Taking over-the-counter medicines, vitamins, herbs, and supplements. General instructions  Do not put contact lenses in either eye on the day of your surgery.  Plan to have someone take you home from the hospital or clinic.  If you will be going home right after the procedure, plan to have someone with you for 24 hours.  Ask your health care provider what steps will be taken to help prevent infection. These may include: ? Washing skin with a germ-killing soap. ? Taking antibiotic medicine. What happens during the procedure?  An IV may be inserted into one of your veins.  You will be given one or both of the following: ? A medicine to help you relax (sedative). ? A medicine to numb the area (local anesthetic). This may be numbing eye drops or an injection that is given behind the eye.  A small cut (incision) will be made to the edge of the clear surface that covers the front of the eye (cornea).  A small probe will be inserted into the eye. This device gives off ultrasound waves that soften and break up the cloudy center of the lens. This makes it easier for the cataract to be removed.  The cataract will be removed by suction.  An IOL may be implanted.  Part of the capsule that surrounds the lens will be left in the eye to support the IOL.  Your surgeon may use stitches (sutures) to close the incision. The procedure may vary among health care providers and hospitals.      What happens after the procedure?  Your blood pressure, heart rate, breathing rate, and blood oxygen level will be monitored until you leave the hospital or clinic.  You may be given a protective shield to wear over your eye.  You may be given medicines to relieve discomfort and swelling. Some medicines may be in the form of eye drops.  Do not drive for 24 hours if you were given a  sedative during your procedure. Summary  Cataract surgery is a procedure to remove a cloudy lens (cataract) in the eye.  This is a safe procedure. However, problems may occur, including infection, bleeding, glaucoma, inflammation, and loss of vision.  Follow your health care provider's instructions about when to stop eating and drinking and whether to change or stop certain medicines.  After the procedure, you may be given medicines or an eye shield to wear over your eye.  Do not drive for 24 hours if you were given a sedative during your procedure. This information is not intended to replace advice given to you by your health care provider. Make sure you discuss any questions you have with your health care provider. Document Revised: 02/28/2019 Document Reviewed: 11/01/2017 Elsevier Patient Education  [place].

## 2020-08-07 ENCOUNTER — Ambulatory Visit (INDEPENDENT_AMBULATORY_CARE_PROVIDER_SITE_OTHER): Payer: Medicare HMO

## 2020-08-07 ENCOUNTER — Ambulatory Visit (INDEPENDENT_AMBULATORY_CARE_PROVIDER_SITE_OTHER): Payer: Medicare HMO | Admitting: Physician Assistant

## 2020-08-07 VITALS — BP 113/73 | HR 75 | Temp 97.7°F | Ht 61.0 in | Wt 196.5 lb

## 2020-08-07 DIAGNOSIS — I10 Essential (primary) hypertension: Secondary | ICD-10-CM | POA: Diagnosis not present

## 2020-08-07 DIAGNOSIS — I872 Venous insufficiency (chronic) (peripheral): Secondary | ICD-10-CM

## 2020-08-07 DIAGNOSIS — M25511 Pain in right shoulder: Secondary | ICD-10-CM | POA: Diagnosis not present

## 2020-08-07 DIAGNOSIS — I5032 Chronic diastolic (congestive) heart failure: Secondary | ICD-10-CM

## 2020-08-07 DIAGNOSIS — M19011 Primary osteoarthritis, right shoulder: Secondary | ICD-10-CM | POA: Diagnosis not present

## 2020-08-07 NOTE — Patient Instructions (Addendum)
Today - go to XRAY for image of RIGHT SHOULDER. May need to consider Physical Therapy for this shoulder. May use ice or heat to area and gentle stretches.   Your legs look so much better! Very happy with how much the swelling has decreased!! Continue to stay off the amlodipine.  Your blood pressure looks great, but continue to monitor at home.  Continue the Lasix three times daily. Take potassium with each lasix pill.  I will see you back as scheduled in April and we will check labs again at that visit.   Call sooner if any problems!

## 2020-08-07 NOTE — Progress Notes (Signed)
Established Patient Office Visit  Subjective:  Patient ID: [name], female    DOB: 08/19/1940  Age: 80 y.o. MRN: [id number]  CC:  Chief Complaint  Patient presents with  . Follow-up    HPI [name] presents for follow up from previous visit. [name] has the [employer] still on her RLE. Weight at home 191 lbs. Swelling looks better. [name] is trying to limit her salt intake. Has followed instructions from last visit.   [name] also complains of R shoulder pain. States it has been ongoing and seems to be worse. [name] has tried ice to area without much relief. [name] has not had any work-up done, but thinks it is her rotator cuff. It is affecting her quality of ADLs.   Past Medical History:  Diagnosis Date  . AK (actinic keratosis)   . Anxiety    occasional  . Breast cancer ([place])    R breast, s/p radiation 34Gy/2fx 04/29/10-05/05/10  . CTS (carpal tunnel syndrome)    Left  . Diabetes ([place])   . Diastolic CHF ([place])    Dx w/ hospitalization 06/2015 for respiratory failure  . Facial asymmetry, acquired 08/20/2016   From injury  . Falls frequently    "can not pick left leg up"  . Hearing loss    bilateral hearing aides  . History of environmental allergies   . History of kidney stones 20 years ago  . Hypertension   . MDD (major depressive disorder)   . No blood products ([facility])  09/11/2013  . Osteoarthritis    hx shoulder, knee, back, wrist pain; saw Dr. Wynelle Link   . Personal history of radiation therapy   . RESTLESS LEGS SYNDROME 05/05/2007   Qualifier: Diagnosis of  By: Gwenette Greet MD, Armando Reichert   . RLS (restless legs syndrome)   . Sleep apnea     Past Surgical History:  Procedure Laterality Date  . APPENDECTOMY  age 65  . BREAST LUMPECTOMY Right 2012  . BREAST SURGERY  2012   rt breast lumpectomy  . flex signoidoscopy    . INCISION AND DRAINAGE PERIRECTAL ABSCESS    . JOINT REPLACEMENT Right 2006   knee  . REPLACEMENT TOTAL KNEE Right   . TOTAL KNEE ARTHROPLASTY Left  08/06/2014   Procedure: LEFT TOTAL KNEE ARTHROPLASTY;  Surgeon: Gaynelle Arabian, MD;  Location: [facility];  Service: Orthopedics;  Laterality: Left;  . TUBAL LIGATION  1979    Family History  Problem Relation Age of Onset  . Alcohol abuse Father   . Cancer Sister        breast  . Heart disease Paternal Grandfather   . Diabetes Other   . Obesity Other   . Heart disease Brother        Valve    Social History   Socioeconomic History  . Marital status: Married    Spouse name: Not on file  . Number of children: 3  . Years of education: Not on file  . Highest education level: Not on file  Occupational History  . Not on file  Tobacco Use  . Smoking status: Never Smoker  . Smokeless tobacco: Never Used  Substance and Sexual Activity  . Alcohol use: No  . Drug use: No  . Sexual activity: Yes  Other Topics Concern  . Not on file  Social History Narrative         Home Situation: lives with husband and granddaughter      Spiritual Beliefs:  Jehovah Witness - no blood products               Social Determinants of Radio broadcast assistant Strain: Not on file  Food Insecurity: No Food Insecurity  . Worried About Charity fundraiser in the Last Year: Never true  . Ran Out of Food in the Last Year: Never true  Transportation Needs: No Transportation Needs  . Lack of Transportation (Medical): No  . Lack of Transportation (Non-Medical): No  Physical Activity: Not on file  Stress: Not on file  Social Connections: Not on file  Intimate Partner Violence: Not on file    Outpatient Medications Prior to Visit  Medication Sig Dispense Refill  . apixaban (ELIQUIS) 5 MG TABS tablet Take 1 tablet (5 mg total) by mouth 2 (two) times daily. 60 tablet 11  . Ascorbic Acid (VITAMIN C PO) Take 2 tablets by mouth daily.    . Calcium Citrate-Vitamin D (CALCIUM + D PO) Take 2 tablets by mouth daily.    . carvedilol (COREG) 3.125 MG tablet TAKE 1 TABLET TWICE DAILY WITH MEALS (Patient taking  differently: Take 3.125 mg by mouth 2 (two) times daily with a meal.) 180 tablet 1  . Cholecalciferol (VITAMIN D3) 50 MCG (2000 UT) CHEW Chew 4,000 Units by mouth daily.    . Cyanocobalamin (B-12 PO) Take 2 tablets by mouth daily.    . DROPLET PEN NEEDLES 32G X 4 MM MISC USE AS DIRECTED ONE TIME DAILY 100 each 1  . furosemide (LASIX) 80 MG tablet TAKE 1 TABLET BY MOUTH 2 TIMES DAILY. (Patient taking differently: Take 80 mg by mouth 3 (three) times daily.) 180 tablet 3  . HYDROcodone-acetaminophen (NORCO/VICODIN) 5-325 MG tablet Take 1 tablet by mouth 2 (two) times daily as needed for moderate pain.    Marland Kitchen LANTUS SOLOSTAR 100 UNIT/ML Solostar Pen INJECT 25 UNITS INTO THE SKIN AT BEDTIME. 30 mL 2  . lisinopril (ZESTRIL) 20 MG tablet TAKE 1 TABLET (20 MG TOTAL) BY MOUTH DAILY. 90 tablet 2  . metFORMIN (GLUCOPHAGE) 1000 MG tablet TAKE 1 TABLET TWICE DAILY (Patient taking differently: Take 1,000 mg by mouth in the morning and at bedtime.) 180 tablet 1  . potassium chloride (KLOR-CON) 10 MEQ tablet Take 2 tablets (20 mEq total) by mouth 2 (two) times daily. Take with lasix (Patient taking differently: Take 20 mEq by mouth 3 (three) times daily. Take with lasix) 270 tablet 0  . pramipexole (MIRAPEX) 1 MG tablet TAKE 1 TABLET TWICE DAILY (Patient taking differently: Take 1 mg by mouth 2 (two) times daily.) 180 tablet 1  . triamcinolone (KENALOG) 0.1 % Apply to red areas on legs twice a day as needed (Patient taking differently: Apply 1 application topically 2 (two) times daily as needed (redness). Apply to red areas on legs) 45 g 0  . amLODipine (NORVASC) 10 MG tablet Take 0.5 tablets (5 mg total) by mouth daily. 90 tablet 3   No facility-administered medications prior to visit.    Allergies  Allergen Reactions  . Sulfa Antibiotics Swelling     Facial Swelling "swelling lips"   . Sulfonamide Derivatives Swelling    Lips Swelling  . Other     BLOOD PRODUCT REFUSAL    ROS Review of Systems   Constitutional: Negative for activity change and unexpected weight change.  Respiratory: Negative for shortness of breath, wheezing and stridor.   Cardiovascular: Positive for leg swelling (BETTER). Negative for chest pain and palpitations.  Gastrointestinal: Negative  for nausea and vomiting.  Musculoskeletal: Positive for arthralgias (R SHOULDER PAIN). Negative for joint swelling.  Skin: Negative for rash and wound.  Neurological: Negative for dizziness and headaches.      Objective:    Physical Exam Vitals and nursing note reviewed.  Constitutional:      General: [name] is not in acute distress.    Appearance: [name] is obese.  Cardiovascular:     Rate and Rhythm: Rhythm irregular.     Heart sounds: No murmur heard.   Pulmonary:     Effort: Pulmonary effort is normal. No respiratory distress.     Breath sounds: Normal breath sounds. No stridor. No wheezing or rales.  Musculoskeletal:     Right shoulder: No deformity, effusion or tenderness. Decreased range of motion (pain with flexion and abduction). Decreased strength.     Right lower leg: Edema (+ MILD erythema; Homan's sign negative; NO WEEPING TODAY.) present.     Left lower leg: 1+ Edema (mild erythema, Homan's sign neg, no weeping) present.  Neurological:     Mental Status: Mental status is at baseline.  Psychiatric:        Mood and Affect: Mood normal.     BP 113/73   Pulse 75   Temp 97.7 F (36.5 C)   Ht 5\' 1"  (1.549 m)   Wt 196 lb 8 oz (89.1 kg)   SpO2 92%   BMI 37.13 kg/m  Wt Readings from Last 3 Encounters:  08/07/20 196 lb 8 oz (89.1 kg)  07/31/20 196 lb 3.2 oz (89 kg)  07/24/20 200 lb (90.7 kg)     Health Maintenance Due  Topic Date Due  . OPHTHALMOLOGY EXAM  12/20/2019  . FOOT EXAM  01/24/2020    There are no preventive care reminders to display for this patient.  Lab Results  Component Value Date   TSH 0.902 07/08/2019   Lab Results  Component Value Date   WBC 10.3 05/27/2020   HGB 14.3  05/27/2020   HCT 42.4 05/27/2020   MCV 95 05/27/2020   PLT 269 05/27/2020   Lab Results  Component Value Date   NA 142 07/31/2020   K 4.0 07/31/2020   CHLORIDE 101 01/25/2014   CO2 33 (H) 07/31/2020   GLUCOSE 80 07/31/2020   BUN 20 07/31/2020   CREATININE 0.83 07/31/2020   BILITOT 0.6 06/11/2020   ALKPHOS 102 06/11/2020   AST 18 06/11/2020   ALT 13 06/11/2020   PROT 7.0 06/11/2020   ALBUMIN 3.7 06/11/2020   CALCIUM 9.6 07/31/2020   ANIONGAP 8 07/10/2019   GFR 66.14 07/31/2020   Lab Results  Component Value Date   CHOL 101 06/11/2020   Lab Results  Component Value Date   HDL 31.40 (L) 06/11/2020   Lab Results  Component Value Date   LDLCALC 51 06/11/2020   Lab Results  Component Value Date   TRIG 95.0 06/11/2020   Lab Results  Component Value Date   CHOLHDL 3 06/11/2020   Lab Results  Component Value Date   HGBA1C 6.9 (H) 06/11/2020      Assessment & Plan:   Problem List Items Addressed This Visit      Cardiovascular and Mediastinum   Essential hypertension   Chronic diastolic congestive heart failure ([place])    Other Visit Diagnoses    Right shoulder pain, unspecified chronicity    -  Primary   Relevant Orders   DG Shoulder Right   Venous stasis dermatitis of both lower extremities  Venous insufficiency (chronic) (peripheral)          No orders of the defined types were placed in this encounter.   Follow-up: No follow-ups on file.   1. Right shoulder pain, unspecified chronicity Suspect OA and / or rotator involvement. Will send for XR today and call with radiology report. May need to consider PT. [name] has Norco that [name] takes twice daily for pain from specialist.   2. Chronic diastolic congestive heart failure (H[facility]) 3. Venous stasis dermatitis of both lower extremities 4. Venous insufficiency (chronic) (peripheral) Great improvement in lower extremity edema. Will not place another [employer] today. Cont to monitor. Lasix 80 mg TID with  K+ TID. Will recheck renal function at next visit. Limit salt and elevate legs.   5. Essential hypertension Stable. Amlodipine discontinued. Still taking Lisinopril 20 mg daily.   This visit occurred during the SARS-CoV-2 public health emergency.  Safety protocols were in place, including screening questions prior to the visit, additional usage of staff PPE, and extensive cleaning of exam room while observing appropriate contact time as indicated for disinfecting solutions.    Somya Jauregui M Mackynzie Woolford, PA-C

## 2020-08-09 ENCOUNTER — Other Ambulatory Visit: Payer: Self-pay

## 2020-08-09 ENCOUNTER — Other Ambulatory Visit (HOSPITAL_COMMUNITY)
Admission: RE | Admit: 2020-08-09 | Discharge: 2020-08-09 | Disposition: A | Discharge status: Home / Self Care | Payer: Medicare HMO | Source: Ambulatory Visit | Attending: Ophthalmology | Admitting: Ophthalmology

## 2020-08-09 DIAGNOSIS — Z20822 Contact with and (suspected) exposure to covid-19: Secondary | ICD-10-CM | POA: Insufficient documentation

## 2020-08-09 DIAGNOSIS — Z01812 Encounter for preprocedural laboratory examination: Secondary | ICD-10-CM | POA: Diagnosis not present

## 2020-08-09 LAB — SARS CORONAVIRUS 2 (TAT 6-24 HRS): SARS Coronavirus 2: NEGATIVE

## 2020-08-12 ENCOUNTER — Other Ambulatory Visit: Payer: Self-pay

## 2020-08-12 ENCOUNTER — Ambulatory Visit (HOSPITAL_COMMUNITY): Payer: Medicare HMO | Admitting: Anesthesiology

## 2020-08-12 ENCOUNTER — Encounter (HOSPITAL_COMMUNITY)
Admission: RE | Disposition: A | Discharge status: Home / Self Care | Payer: Self-pay | Source: Home / Self Care | Attending: Ophthalmology

## 2020-08-12 ENCOUNTER — Encounter (HOSPITAL_COMMUNITY): Payer: Self-pay | Admitting: Ophthalmology

## 2020-08-12 ENCOUNTER — Ambulatory Visit (HOSPITAL_COMMUNITY)
Admission: RE | Admit: 2020-08-12 | Discharge: 2020-08-12 | Disposition: A | Discharge status: Home / Self Care | Payer: Medicare HMO | Attending: Ophthalmology | Admitting: Ophthalmology

## 2020-08-12 DIAGNOSIS — H02834 Dermatochalasis of left upper eyelid: Secondary | ICD-10-CM | POA: Insufficient documentation

## 2020-08-12 DIAGNOSIS — Z888 Allergy status to other drugs, medicaments and biological substances status: Secondary | ICD-10-CM | POA: Insufficient documentation

## 2020-08-12 DIAGNOSIS — E1136 Type 2 diabetes mellitus with diabetic cataract: Secondary | ICD-10-CM | POA: Diagnosis not present

## 2020-08-12 DIAGNOSIS — Z882 Allergy status to sulfonamides status: Secondary | ICD-10-CM | POA: Insufficient documentation

## 2020-08-12 DIAGNOSIS — H25812 Combined forms of age-related cataract, left eye: Secondary | ICD-10-CM | POA: Diagnosis not present

## 2020-08-12 DIAGNOSIS — Z7984 Long term (current) use of oral hypoglycemic drugs: Secondary | ICD-10-CM | POA: Insufficient documentation

## 2020-08-12 DIAGNOSIS — H02831 Dermatochalasis of right upper eyelid: Secondary | ICD-10-CM | POA: Diagnosis not present

## 2020-08-12 DIAGNOSIS — H0100B Unspecified blepharitis left eye, upper and lower eyelids: Secondary | ICD-10-CM | POA: Insufficient documentation

## 2020-08-12 DIAGNOSIS — H0100A Unspecified blepharitis right eye, upper and lower eyelids: Secondary | ICD-10-CM | POA: Diagnosis not present

## 2020-08-12 DIAGNOSIS — I5033 Acute on chronic diastolic (congestive) heart failure: Secondary | ICD-10-CM | POA: Diagnosis not present

## 2020-08-12 HISTORY — PX: CATARACT EXTRACTION W/PHACO: SHX586

## 2020-08-12 LAB — GLUCOSE, CAPILLARY: Glucose-Capillary: 70 mg/dL (ref 70–99)

## 2020-08-12 SURGERY — PHACOEMULSIFICATION, CATARACT, WITH IOL INSERTION
Anesthesia: Monitor Anesthesia Care | Site: Eye | Laterality: Left

## 2020-08-12 MED ORDER — TETRACAINE HCL 0.5 % OP SOLN
1.0000 [drp] | OPHTHALMIC | Status: AC | PRN
  Administered 2020-08-12 (×3): 1 [drp] via OPHTHALMIC

## 2020-08-12 MED ORDER — POVIDONE-IODINE 5 % OP SOLN
OPHTHALMIC | Status: DC | PRN
  Administered 2020-08-12: 1 via OPHTHALMIC

## 2020-08-12 MED ORDER — STERILE WATER FOR IRRIGATION IR SOLN
Status: DC | PRN
  Administered 2020-08-12: 250 mL

## 2020-08-12 MED ORDER — TROPICAMIDE 1 % OP SOLN
1.0000 [drp] | OPHTHALMIC | Status: AC
Start: 2020-08-12 — End: 2020-08-12
  Administered 2020-08-12 (×3): 1 [drp] via OPHTHALMIC

## 2020-08-12 MED ORDER — SODIUM HYALURONATE 23 MG/ML IO SOLN
INTRAOCULAR | Status: DC | PRN
  Administered 2020-08-12: 0.6 mL via INTRAOCULAR

## 2020-08-12 MED ORDER — EPINEPHRINE PF 1 MG/ML IJ SOLN
INTRAMUSCULAR | Status: AC
  Filled 2020-08-12: qty 2

## 2020-08-12 MED ORDER — LIDOCAINE HCL 3.5 % OP GEL: 1.0000 "application " | Freq: Once | OPHTHALMIC | Status: DC

## 2020-08-12 MED ORDER — PHENYLEPHRINE HCL 2.5 % OP SOLN
1.0000 [drp] | OPHTHALMIC | Status: AC | PRN
  Administered 2020-08-12 (×3): 1 [drp] via OPHTHALMIC

## 2020-08-12 MED ORDER — LIDOCAINE HCL (PF) 1 % IJ SOLN
INTRAOCULAR | Status: DC | PRN
  Administered 2020-08-12: 1 mL via OPHTHALMIC

## 2020-08-12 MED ORDER — LIDOCAINE 3.5 % OP GEL OPTIME - NO CHARGE
OPHTHALMIC | Status: DC | PRN
  Administered 2020-08-12: 1 [drp] via OPHTHALMIC

## 2020-08-12 MED ORDER — PROVISC 10 MG/ML IO SOLN
INTRAOCULAR | Status: DC | PRN
  Administered 2020-08-12: 0.85 mL via INTRAOCULAR

## 2020-08-12 MED ORDER — EPINEPHRINE PF 1 MG/ML IJ SOLN
INTRAOCULAR | Status: DC | PRN
  Administered 2020-08-12: 500 mL

## 2020-08-12 MED ORDER — BSS IO SOLN
INTRAOCULAR | Status: DC | PRN
  Administered 2020-08-12: 15 mL

## 2020-08-12 SURGICAL SUPPLY — 13 items
CLOTH BEACON ORANGE TIMEOUT ST (SAFETY) ×1 IMPLANT
EYE SHIELD UNIVERSAL CLEAR (GAUZE/BANDAGES/DRESSINGS) ×1 IMPLANT
GLOVE SURG UNDER POLY LF SZ6.5 (GLOVE) ×1 IMPLANT
GLOVE SURG UNDER POLY LF SZ7 (GLOVE) ×2 IMPLANT
NDL HYPO 18GX1.5 BLUNT FILL (NEEDLE) IMPLANT
NEEDLE HYPO 18GX1.5 BLUNT FILL (NEEDLE) ×2 IMPLANT
PAD ARMBOARD 7.5X6 YLW CONV (MISCELLANEOUS) ×1 IMPLANT
SYR TB 1ML LL NO SAFETY (SYRINGE) ×2 IMPLANT
TAPE SURG TRANSPORE 1 IN (GAUZE/BANDAGES/DRESSINGS) ×1 IMPLANT
TAPE SURGICAL TRANSPORE 1 IN (GAUZE/BANDAGES/DRESSINGS) ×2
TECNIS 1 PIECE INTRAOCULAR LENS (Intraocular Lens) ×2 IMPLANT
VISCOELASTIC ADDITIONAL (OPHTHALMIC RELATED) IMPLANT
WATER STERILE IRR 250ML POUR (IV SOLUTION) ×2 IMPLANT

## 2020-08-12 NOTE — Progress Notes (Signed)
IV site left hand removed without difficulty. SIte clean, dry and intact. Catheter tip intact.

## 2020-08-12 NOTE — Interval H&P Note (Signed)
History and Physical Interval Note:  08/12/2020 11:43 AM  [name]  has presented today for surgery, with the diagnosis of Nuclear sclerotic cataract - Left eye.  The various methods of treatment have been discussed with the patient and family. After consideration of risks, benefits and other options for treatment, the patient has consented to  Procedure(s) with comments: CATARACT EXTRACTION PHACO AND INTRAOCULAR LENS PLACEMENT LEFT EYE (Left) - left as a surgical intervention.  The patient's history has been reviewed, patient examined, no change in status, stable for surgery.  I have reviewed the patient's chart and labs.  Questions were answered to the patient's satisfaction.     Baruch Goldmann

## 2020-08-12 NOTE — Discharge Instructions (Signed)
Please discharge patient when stable, will follow up today with Dr. Shadrack Brummitt at the [facility] office immediately following discharge.  Leave shield in place until visit.  All paperwork with discharge instructions will be given at the office.   [facility] Address:  [address]  [place], [place] [address]  

## 2020-08-12 NOTE — Transfer of Care (Signed)
Immediate Anesthesia Transfer of Care Note  Patient: [name]  Procedure(s) Performed: CATARACT EXTRACTION PHACO AND INTRAOCULAR LENS PLACEMENT LEFT EYE (Left Eye)  Patient Location: Short Stay  Anesthesia Type:MAC  Level of Consciousness: awake  Airway & Oxygen Therapy: Patient Spontanous Breathing  Post-op Assessment: Report given to RN  Post vital signs: Reviewed  Last Vitals:  Vitals Value Taken Time  BP    Temp    Pulse    Resp    SpO2      Last Pain: There were no vitals filed for this visit.       Complications: No complications documented.

## 2020-08-12 NOTE — Anesthesia Postprocedure Evaluation (Signed)
Anesthesia Post Note  Patient: [name]  Procedure(s) Performed: CATARACT EXTRACTION PHACO AND INTRAOCULAR LENS PLACEMENT LEFT EYE (Left Eye)  Patient location during evaluation: Short Stay Anesthesia Type: MAC Level of consciousness: awake and alert Pain management: pain level controlled Vital Signs Assessment: post-procedure vital signs reviewed and stable Respiratory status: spontaneous breathing Cardiovascular status: blood pressure returned to baseline and stable Postop Assessment: no apparent nausea or vomiting Anesthetic complications: no   No complications documented.   Last Vitals: There were no vitals filed for this visit.  Last Pain: There were no vitals filed for this visit.               Amairani Shuey

## 2020-08-12 NOTE — Anesthesia Preprocedure Evaluation (Addendum)
Anesthesia Evaluation  Patient identified by MRN, date of birth, ID band Patient awake    Reviewed: Allergy & Precautions, NPO status , Patient's Chart, lab work & pertinent test results, reviewed documented beta blocker date and time   History of Anesthesia Complications Negative for: history of anesthetic complications  Airway Mallampati: II  TM Distance: >3 FB Neck ROM: Full    Dental  (+) Dental Advisory Given   Pulmonary shortness of breath, with exertion and Long-Term Oxygen Therapy, sleep apnea and Oxygen sleep apnea ,    breath sounds clear to auscultation       Cardiovascular Exercise Tolerance: Poor hypertension, Pt. on medications and Pt. on home beta blockers +CHF  + dysrhythmias Atrial Fibrillation  Rhythm:Irregular Rate:Normal  1. Left ventricular ejection fraction, by estimation, is 60 to 65%. The left ventricle has normal function. The left ventricle has no regional wall motion abnormalities. There is mild to moderate left ventricular  hypertrophy. Left ventricular diastolic  parameters are consistent with Grade I diastolic dysfunction (impaired  relaxation).  2. Right ventricular systolic function is normal. The right ventricular  size is normal. Tricuspid regurgitation signal is inadequate for assessing  PA pressure.  3. The mitral valve is grossly normal, there is mild to moderate annular calcification. Trivial mitral valve regurgitation.  4. The aortic valve was not well visualized. Mild aortic leaflet calcification. Aortic valve regurgitation is mild.  5. IVC dilated, but not able to estimate CVP (no sniff).   Neuro/Psych PSYCHIATRIC DISORDERS Anxiety Depression  Neuromuscular disease (Diabetic neuropathy, RLS)    GI/Hepatic negative GI ROS, Neg liver ROS,   Endo/Other  diabetes, Well Controlled, Type 2, Insulin Dependent, Oral Hypoglycemic Agents  Renal/GU negative Renal ROS      Musculoskeletal  (+) Arthritis ,   Abdominal   Peds  Hematology negative hematology ROS (+)   Anesthesia Other Findings Jehovah's witness Right breast cancer  Reproductive/Obstetrics negative OB ROS                           Anesthesia Physical Anesthesia Plan  ASA: IV  Anesthesia Plan: MAC   Post-op Pain Management:    Induction:   PONV Risk Score and Plan:   Airway Management Planned: Nasal Cannula and Natural Airway  Additional Equipment:   Intra-op Plan:   Post-operative Plan:   Informed Consent: I have reviewed the patients History and Physical, chart, labs and discussed the procedure including the risks, benefits and alternatives for the proposed anesthesia with the patient or authorized representative who has indicated his/her understanding and acceptance.     Dental advisory given  Plan Discussed with: CRNA and Surgeon  Anesthesia Plan Comments:        Anesthesia Quick Evaluation

## 2020-08-12 NOTE — Op Note (Signed)
Date of procedure: 08/12/20  Pre-operative diagnosis: Visually significant age-related combined cataract, Left Eye (H25.812)  Post-operative diagnosis: Visually significant age-related combined cataract, Left Eye (H25.812)  Procedure: Removal of cataract via phacoemulsification and insertion of intra-ocular lens Wynetta Emery and [place]  +17.5D into the capsular bag of the Left Eye  Attending surgeon: Gerda Diss. Fany Cavanaugh, MD, MA  Anesthesia: MAC, Topical [place]  Complications: None  Estimated Blood Loss: <6m (minimal)  Specimens: None  Implants: As above  Indications:  Visually significant age-related cataract, Left Eye  Procedure:  The patient was seen and identified in the pre-operative area. The operative eye was identified and dilated.  The operative eye was marked.  Topical anesthesia was administered to the operative eye.     The patient was then to the operative suite and placed in the supine position.  A timeout was performed confirming the patient, procedure to be performed, and all other relevant information.   The patient's face was prepped and draped in the usual fashion for intra-ocular surgery.  A lid speculum was placed into the operative eye and the surgical microscope moved into place and focused.  An inferotemporal paracentesis was created using a 20 gauge paracentesis blade.  Shugarcaine was injected into the anterior chamber.  Viscoelastic was injected into the anterior chamber.  A temporal clear-corneal main wound incision was created using a 2.424mmicrokeratome.  A continuous curvilinear capsulorrhexis was initiated using an irrigating cystitome and completed using capsulorrhexis forceps.  Hydrodissection and hydrodeliniation were performed.  Viscoelastic was injected into the anterior chamber.  A phacoemulsification handpiece and a chopper as a second instrument were used to remove the nucleus and epinucleus. The irrigation/aspiration handpiece was used to remove  any remaining cortical material.   The capsular bag was reinflated with viscoelastic, checked, and found to be intact.  The intraocular lens was inserted into the capsular bag.  The irrigation/aspiration handpiece was used to remove any remaining viscoelastic.  The clear corneal wound and paracentesis wounds were then hydrated and checked with Weck-Cels to be watertight.  The lid-speculum was removed.  The drape was removed.  The patient's face was cleaned with a wet and dry 4x4. A clear shield was taped over the eye. The patient was taken to the post-operative care unit in good condition, having tolerated the procedure well.  Post-Op Instructions: [facility] and will receive all other orders and instructions.

## 2020-08-13 ENCOUNTER — Encounter (HOSPITAL_COMMUNITY): Payer: Self-pay | Admitting: Ophthalmology

## 2020-08-15 ENCOUNTER — Other Ambulatory Visit: Payer: Self-pay | Admitting: Physician Assistant

## 2020-08-16 ENCOUNTER — Other Ambulatory Visit: Payer: Self-pay | Admitting: Physician Assistant

## 2020-08-19 DIAGNOSIS — H25811 Combined forms of age-related cataract, right eye: Secondary | ICD-10-CM | POA: Diagnosis not present

## 2020-08-20 NOTE — H&P (Signed)
Surgical History & Physical  Patient Name: [name] DOB: 10-27-1940  Surgery: Cataract extraction with intraocular lens implant phacoemulsification; Right Eye  Surgeon: Baruch Goldmann MD Surgery Date:  08/26/2020 Pre-Op Date:  08/19/2020  HPI: A 38 Yr. old female patient PO OS/Pre-Op OD The patient is returning after cataract surgery. The left eye is affected. Status post cataract surgery, which began 1 week ago: Since the last visit, the affected area is doing well. The patient's vision is improved and stable. Patient is following medication instructions. Omni TID OS. No increase in floaters. The patient complains of difficulty when viewing TV, reading closed caption, news scrolls on TV, which began years ago. The right eye is affected. The condition's severity is worsening. The condition is worse with daily activities. Symptoms are negatively affecting pt's quality of life HPI was performed by Baruch Goldmann .  Medical History: Cataracts Myopia Retinal tear without Detachment OU PVD OU Cancer Diabetes High Blood Pressure LDL Sleep apnea?  Review of Systems Negative Allergic/Immunologic Negative Cardiovascular Negative Constitutional Negative Ear, Nose, Mouth & Throat Negative Endocrine Negative Eyes Negative Gastrointestinal Negative Genitourinary Negative Hemotologic/Lymphatic Negative Integumentary Negative Musculoskeletal Negative Neurological Negative Psychiatry Negative Respiratory  Social   Never smoked   Medication Prednisolone-Moxifloxacin-Bromfenac,  Amlodipine Besylate, Metformin, Lisinopril-HCTZ, Carvedilol, Pramipexole, Potassium chloride ER, Amlodipine, Furosemide,   Sx/Procedures Retina Laser, Phaco c IOL,  Knee Surgery, Appendectomy,   Drug Allergies  Sulfa, Blood thinners,   History & Physical: Heent:  Cataract, Right eye NECK: supple without bruits LUNGS: lungs clear to auscultation CV: regular rate and rhythm Abdomen: soft and  non-tender  Impression & Plan: Assessment: 1.  CATARACT EXTRACTION STATUS; Left Eye (Z98.42) 2.  COMBINED FORMS AGE RELATED CATARACT; Right Eye (H25.811) 3.  INTRAOCULAR LENS IOL (Z96.1)  Plan: 1.  1 week after cataract surgery. Doing well with improved vision and normal eye pressure. Call with any problems or concerns. Continue Pred-Moxi-Brom 2x/day for 3 more weeks. 2.  Cataract accounts for the patient's decreased vision. This visual impairment is not correctable with a tolerable change in glasses or contact lenses. Cataract surgery with an implantation of a new lens should significantly improve the visual and functional status of the patient. Discussed all risks, benefits, alternatives, and potential complications. Discussed the procedures and recovery. Patient desires to have surgery. A-scan ordered and performed today for intra-ocular lens calculations. The surgery will be performed in order to improve vision for driving, reading, and for eye examinations. Recommend phacoemulsification with intra-ocular lens. Recommend Dextenza for post-operative pain and inflammation. Right Eye. Surgery required to correct imbalance of vision. Dilates well - shugarcaine by protocol. 3.  Doing well since surgery Continue Post-op medications

## 2020-08-21 ENCOUNTER — Encounter: Payer: Self-pay | Admitting: Physician Assistant

## 2020-08-21 ENCOUNTER — Encounter (HOSPITAL_COMMUNITY)
Admission: RE | Admit: 2020-08-21 | Discharge: 2020-08-21 | Disposition: A | Discharge status: Home / Self Care | Payer: Medicare HMO | Source: Ambulatory Visit | Attending: Ophthalmology | Admitting: Ophthalmology

## 2020-08-21 ENCOUNTER — Encounter (HOSPITAL_COMMUNITY): Payer: Self-pay

## 2020-08-21 ENCOUNTER — Other Ambulatory Visit: Payer: Self-pay

## 2020-08-21 MED ORDER — POTASSIUM CHLORIDE ER 10 MEQ PO TBCR: 20.0000 meq | EXTENDED_RELEASE_TABLET | Freq: Three times a day (TID) | ORAL | 0 refills | Status: DC

## 2020-08-21 MED ORDER — FUROSEMIDE 80 MG PO TABS: 80.0000 mg | ORAL_TABLET | Freq: Three times a day (TID) | ORAL | 1 refills | Status: DC

## 2020-08-23 ENCOUNTER — Other Ambulatory Visit: Payer: Self-pay

## 2020-08-23 ENCOUNTER — Other Ambulatory Visit (HOSPITAL_COMMUNITY)
Admission: RE | Admit: 2020-08-23 | Discharge: 2020-08-23 | Disposition: A | Discharge status: Home / Self Care | Payer: Medicare HMO | Source: Ambulatory Visit | Attending: Ophthalmology | Admitting: Ophthalmology

## 2020-08-23 ENCOUNTER — Telehealth: Payer: Self-pay

## 2020-08-23 DIAGNOSIS — Z20822 Contact with and (suspected) exposure to covid-19: Secondary | ICD-10-CM | POA: Diagnosis not present

## 2020-08-23 DIAGNOSIS — Z01812 Encounter for preprocedural laboratory examination: Secondary | ICD-10-CM | POA: Insufficient documentation

## 2020-08-23 DIAGNOSIS — I10 Essential (primary) hypertension: Secondary | ICD-10-CM

## 2020-08-23 MED ORDER — CARVEDILOL 3.125 MG PO TABS: 3.1250 mg | ORAL_TABLET | Freq: Two times a day (BID) | ORAL | 1 refills | Status: DC

## 2020-08-23 MED ORDER — DROPLET PEN NEEDLES 32G X 4 MM MISC: 6 refills | Status: DC

## 2020-08-23 MED ORDER — PRAMIPEXOLE DIHYDROCHLORIDE 1 MG PO TABS: 1.0000 mg | ORAL_TABLET | Freq: Two times a day (BID) | ORAL | 1 refills | Status: DC

## 2020-08-23 NOTE — Telephone Encounter (Signed)
Rx sent in

## 2020-08-23 NOTE — Telephone Encounter (Signed)
..   LAST APPOINTMENT DATE: 08/07/2020   NEXT APPOINTMENT DATE:@4 /14/2022  MEDICATION:potassium chloride (KLOR-CON) 10 MEQ tablet DROPLET PEN NEEDLES 32G X 4 MM MISC carvedilol (COREG) 3.125 MG tablet  pramipexole (MIRAPEX) 1 MG tablet      [place], [place]     OTHER COMMENTS:    Okay for refill?  Please advise

## 2020-08-24 LAB — SARS CORONAVIRUS 2 (TAT 6-24 HRS): SARS Coronavirus 2: NEGATIVE

## 2020-08-26 ENCOUNTER — Encounter (HOSPITAL_COMMUNITY)
Admission: RE | Disposition: A | Discharge status: Home / Self Care | Payer: Self-pay | Source: Home / Self Care | Attending: Ophthalmology

## 2020-08-26 ENCOUNTER — Ambulatory Visit (HOSPITAL_COMMUNITY): Payer: Medicare HMO | Admitting: Anesthesiology

## 2020-08-26 ENCOUNTER — Encounter (HOSPITAL_COMMUNITY): Payer: Self-pay | Admitting: Ophthalmology

## 2020-08-26 ENCOUNTER — Ambulatory Visit (HOSPITAL_COMMUNITY)
Admission: RE | Admit: 2020-08-26 | Discharge: 2020-08-26 | Disposition: A | Discharge status: Home / Self Care | Payer: Medicare HMO | Attending: Ophthalmology | Admitting: Ophthalmology

## 2020-08-26 DIAGNOSIS — Z79899 Other long term (current) drug therapy: Secondary | ICD-10-CM | POA: Diagnosis not present

## 2020-08-26 DIAGNOSIS — Z961 Presence of intraocular lens: Secondary | ICD-10-CM | POA: Insufficient documentation

## 2020-08-26 DIAGNOSIS — Z7984 Long term (current) use of oral hypoglycemic drugs: Secondary | ICD-10-CM | POA: Insufficient documentation

## 2020-08-26 DIAGNOSIS — E1136 Type 2 diabetes mellitus with diabetic cataract: Secondary | ICD-10-CM | POA: Diagnosis not present

## 2020-08-26 DIAGNOSIS — I4891 Unspecified atrial fibrillation: Secondary | ICD-10-CM | POA: Diagnosis not present

## 2020-08-26 DIAGNOSIS — I10 Essential (primary) hypertension: Secondary | ICD-10-CM | POA: Diagnosis not present

## 2020-08-26 DIAGNOSIS — Z882 Allergy status to sulfonamides status: Secondary | ICD-10-CM | POA: Insufficient documentation

## 2020-08-26 DIAGNOSIS — Z9842 Cataract extraction status, left eye: Secondary | ICD-10-CM | POA: Diagnosis not present

## 2020-08-26 DIAGNOSIS — H25811 Combined forms of age-related cataract, right eye: Secondary | ICD-10-CM | POA: Insufficient documentation

## 2020-08-26 DIAGNOSIS — Z888 Allergy status to other drugs, medicaments and biological substances status: Secondary | ICD-10-CM | POA: Insufficient documentation

## 2020-08-26 HISTORY — PX: CATARACT EXTRACTION W/PHACO: SHX586

## 2020-08-26 LAB — GLUCOSE, CAPILLARY
Glucose-Capillary: 73 mg/dL (ref 70–99)
Glucose-Capillary: 113 mg/dL — ABNORMAL HIGH (ref 70–99)

## 2020-08-26 SURGERY — PHACOEMULSIFICATION, CATARACT, WITH IOL INSERTION
Anesthesia: Monitor Anesthesia Care | Site: Eye | Laterality: Right

## 2020-08-26 MED ORDER — EPINEPHRINE PF 1 MG/ML IJ SOLN
INTRAOCULAR | Status: DC | PRN
  Administered 2020-08-26: 500 mL

## 2020-08-26 MED ORDER — EPINEPHRINE PF 1 MG/ML IJ SOLN
INTRAMUSCULAR | Status: AC
  Filled 2020-08-26: qty 2

## 2020-08-26 MED ORDER — DEXTROSE 50 % IV SOLN
25.0000 mL | Freq: Once | INTRAVENOUS | Status: AC
  Administered 2020-08-26: 25 mL via INTRAVENOUS

## 2020-08-26 MED ORDER — PHENYLEPHRINE HCL 2.5 % OP SOLN
1.0000 [drp] | OPHTHALMIC | Status: AC | PRN
  Administered 2020-08-26 (×3): 1 [drp] via OPHTHALMIC

## 2020-08-26 MED ORDER — BSS IO SOLN
INTRAOCULAR | Status: DC | PRN
  Administered 2020-08-26: 15 mL

## 2020-08-26 MED ORDER — LIDOCAINE HCL 3.5 % OP GEL
1.0000 "application " | Freq: Once | OPHTHALMIC | Status: AC
  Administered 2020-08-26: 1 via OPHTHALMIC

## 2020-08-26 MED ORDER — DEXTROSE 50 % IV SOLN
INTRAVENOUS | Status: AC
  Filled 2020-08-26: qty 50

## 2020-08-26 MED ORDER — POVIDONE-IODINE 5 % OP SOLN
OPHTHALMIC | Status: DC | PRN
  Administered 2020-08-26: 1 via OPHTHALMIC

## 2020-08-26 MED ORDER — STERILE WATER FOR IRRIGATION IR SOLN
Status: DC | PRN
  Administered 2020-08-26: 250 mL

## 2020-08-26 MED ORDER — PROVISC 10 MG/ML IO SOLN
INTRAOCULAR | Status: DC | PRN
  Administered 2020-08-26: 0.85 mL via INTRAOCULAR

## 2020-08-26 MED ORDER — LIDOCAINE HCL (PF) 1 % IJ SOLN
INTRAOCULAR | Status: DC | PRN
  Administered 2020-08-26: 1 mL via OPHTHALMIC

## 2020-08-26 MED ORDER — TROPICAMIDE 1 % OP SOLN
1.0000 [drp] | OPHTHALMIC | Status: AC
  Administered 2020-08-26 (×3): 1 [drp] via OPHTHALMIC

## 2020-08-26 MED ORDER — TETRACAINE HCL 0.5 % OP SOLN
1.0000 [drp] | OPHTHALMIC | Status: AC | PRN
  Administered 2020-08-26 (×3): 1 [drp] via OPHTHALMIC

## 2020-08-26 MED ORDER — SODIUM HYALURONATE 23 MG/ML IO SOLN
INTRAOCULAR | Status: DC | PRN
  Administered 2020-08-26: 0.6 mL via INTRAOCULAR

## 2020-08-26 SURGICAL SUPPLY — 13 items
CLOTH BEACON ORANGE TIMEOUT ST (SAFETY) ×1 IMPLANT
EYE SHIELD UNIVERSAL CLEAR (GAUZE/BANDAGES/DRESSINGS) ×2 IMPLANT
GLOVE SURG UNDER POLY LF SZ6.5 (GLOVE) ×2 IMPLANT
GLOVE SURG UNDER POLY LF SZ7 (GLOVE) ×2 IMPLANT
NDL HYPO 18GX1.5 BLUNT FILL (NEEDLE) IMPLANT
NEEDLE HYPO 18GX1.5 BLUNT FILL (NEEDLE) ×2 IMPLANT
PAD ARMBOARD 7.5X6 YLW CONV (MISCELLANEOUS) ×1 IMPLANT
SYR TB 1ML LL NO SAFETY (SYRINGE) ×1 IMPLANT
TAPE SURG TRANSPORE 1 IN (GAUZE/BANDAGES/DRESSINGS) IMPLANT
TAPE SURGICAL TRANSPORE 1 IN (GAUZE/BANDAGES/DRESSINGS) ×2
TECNIS 1 PIECE IOL (Intraocular Lens) ×1 IMPLANT
VISCOELASTIC ADDITIONAL (OPHTHALMIC RELATED) IMPLANT
WATER STERILE IRR 250ML POUR (IV SOLUTION) ×1 IMPLANT

## 2020-08-26 NOTE — Op Note (Signed)
Date of procedure: 08/26/20  Pre-operative diagnosis:  Visually significant combined form age-related cataract, Right Eye (H25.811)  Post-operative diagnosis:  Visually significant combined form age-related cataract, Right Eye (H25.811)  Procedure: Removal of cataract via phacoemulsification and insertion of intra-ocular lens Wynetta Emery and [employer] [id number]  +17.0D into the capsular bag of the Right Eye  Attending surgeon: Gerda Diss. Giavanni Odonovan, MD, MA  Anesthesia: MAC, Topical Akten  Complications: None  Estimated Blood Loss: <12m (minimal)  Specimens: None  Implants: As above  Indications:  Visually significant age-related cataract, Right Eye  Procedure:  The patient was seen and identified in the pre-operative area. The operative eye was identified and dilated.  The operative eye was marked.  Topical anesthesia was administered to the operative eye.     The patient was then to the operative suite and placed in the supine position.  A timeout was performed confirming the patient, procedure to be performed, and all other relevant information.   The patient's face was prepped and draped in the usual fashion for intra-ocular surgery.  A lid speculum was placed into the operative eye and the surgical microscope moved into place and focused.  A superotemporal paracentesis was created using a 20 gauge paracentesis blade.  Shugarcaine was injected into the anterior chamber.  Viscoelastic was injected into the anterior chamber.  A temporal clear-corneal main wound incision was created using a 2.431mmicrokeratome.  A continuous curvilinear capsulorrhexis was initiated using an irrigating cystitome and completed using capsulorrhexis forceps.  Hydrodissection and hydrodeliniation were performed.  Viscoelastic was injected into the anterior chamber.  A phacoemulsification handpiece and a chopper as a second instrument were used to remove the nucleus and epinucleus. The irrigation/aspiration handpiece was  used to remove any remaining cortical material.   The capsular bag was reinflated with viscoelastic, checked, and found to be intact.  The intraocular lens was inserted into the capsular bag.  The irrigation/aspiration handpiece was used to remove any remaining viscoelastic.  The clear corneal wound and paracentesis wounds were then hydrated and checked with Weck-Cels to be watertight.  The lid-speculum was removed.  The drape was removed.  The patient's face was cleaned with a wet and dry 4x4. A clear shield was taped over the eye. The patient was taken to the post-operative care unit in good condition, having tolerated the procedure well.  Post-Op Instructions: [facility] and will receive all other orders and instructions.

## 2020-08-26 NOTE — Transfer of Care (Signed)
Immediate Anesthesia Transfer of Care Note  Patient: [name]  Procedure(s) Performed: CATARACT EXTRACTION PHACO AND INTRAOCULAR LENS PLACEMENT RIGHT EYE (Right Eye)  Patient Location: Short Stay  Anesthesia Type:MAC  Level of Consciousness: awake, alert  and oriented  Airway & Oxygen Therapy: Patient Spontanous Breathing  Post-op Assessment: Report given to RN and Post -op Vital signs reviewed and stable  Post vital signs: Reviewed and stable  Last Vitals:  Vitals Value Taken Time  BP    Temp    Pulse    Resp    SpO2      Last Pain:  Vitals:   08/26/20 1101  TempSrc: Oral  PainSc: 0-No pain      Patients Stated Pain Goal: 5 (16/10/96 0454)  Complications: No complications documented.

## 2020-08-26 NOTE — Interval H&P Note (Signed)
History and Physical Interval Note:  08/26/2020 11:04 AM  [name]  has presented today for surgery, with the diagnosis of Nuclear sclerotic cataract - Right eye.  The various methods of treatment have been discussed with the patient and family. After consideration of risks, benefits and other options for treatment, the patient has consented to  Procedure(s) with comments: CATARACT EXTRACTION PHACO AND INTRAOCULAR LENS PLACEMENT (IOC) (Right) - right as a surgical intervention.  The patient's history has been reviewed, patient examined, no change in status, stable for surgery.  I have reviewed the patient's chart and labs.  Questions were answered to the patient's satisfaction.     Baruch Goldmann

## 2020-08-26 NOTE — Anesthesia Preprocedure Evaluation (Signed)
Anesthesia Evaluation  Patient identified by MRN, date of birth, ID band Patient awake    Reviewed: Allergy & Precautions, NPO status , Patient's Chart, lab work & pertinent test results, reviewed documented beta blocker date and time   History of Anesthesia Complications Negative for: history of anesthetic complications  Airway Mallampati: II  TM Distance: >3 FB Neck ROM: Full    Dental  (+) Dental Advisory Given   Pulmonary shortness of breath, with exertion and Long-Term Oxygen Therapy, sleep apnea and Oxygen sleep apnea ,    breath sounds clear to auscultation       Cardiovascular Exercise Tolerance: Poor hypertension, Pt. on medications and Pt. on home beta blockers +CHF  + dysrhythmias Atrial Fibrillation  Rhythm:Irregular Rate:Normal  1. Left ventricular ejection fraction, by estimation, is 60 to 65%. The left ventricle has normal function. The left ventricle has no regional wall motion abnormalities. There is mild to moderate left ventricular  hypertrophy. Left ventricular diastolic  parameters are consistent with Grade I diastolic dysfunction (impaired  relaxation).  2. Right ventricular systolic function is normal. The right ventricular  size is normal. Tricuspid regurgitation signal is inadequate for assessing  PA pressure.  3. The mitral valve is grossly normal, there is mild to moderate annular calcification. Trivial mitral valve regurgitation.  4. The aortic valve was not well visualized. Mild aortic leaflet calcification. Aortic valve regurgitation is mild.  5. IVC dilated, but not able to estimate CVP (no sniff).   Neuro/Psych PSYCHIATRIC DISORDERS Anxiety Depression  Neuromuscular disease (Diabetic neuropathy, RLS)    GI/Hepatic negative GI ROS, Neg liver ROS,   Endo/Other  diabetes, Well Controlled, Type 2, Insulin Dependent, Oral Hypoglycemic Agents  Renal/GU negative Renal ROS      Musculoskeletal  (+) Arthritis ,   Abdominal   Peds  Hematology negative hematology ROS (+)   Anesthesia Other Findings Jehovah's witness Right breast cancer  Reproductive/Obstetrics negative OB ROS                             Anesthesia Physical  Anesthesia Plan  ASA: IV  Anesthesia Plan: MAC   Post-op Pain Management:    Induction:   PONV Risk Score and Plan:   Airway Management Planned: Nasal Cannula and Natural Airway  Additional Equipment:   Intra-op Plan:   Post-operative Plan:   Informed Consent: I have reviewed the patients History and Physical, chart, labs and discussed the procedure including the risks, benefits and alternatives for the proposed anesthesia with the patient or authorized representative who has indicated his/her understanding and acceptance.     Dental advisory given  Plan Discussed with: CRNA and Surgeon  Anesthesia Plan Comments:         Anesthesia Quick Evaluation

## 2020-08-26 NOTE — Anesthesia Procedure Notes (Signed)
Procedure Name: MAC Date/Time: 08/26/2020 11:26 AM Performed by: Orlie Dakin, CRNA Pre-anesthesia Checklist: Patient identified, Emergency Drugs available, Suction available and Patient being monitored Patient Re-evaluated:Patient Re-evaluated prior to induction Oxygen Delivery Method: Nasal cannula Placement Confirmation: positive ETCO2

## 2020-08-26 NOTE — Anesthesia Postprocedure Evaluation (Signed)
Anesthesia Post Note  Patient: [name]  Procedure(s) Performed: CATARACT EXTRACTION PHACO AND INTRAOCULAR LENS PLACEMENT RIGHT EYE (Right Eye)  Patient location during evaluation: Phase II Anesthesia Type: MAC Level of consciousness: awake and alert and oriented Pain management: pain level controlled Vital Signs Assessment: post-procedure vital signs reviewed and stable Respiratory status: spontaneous breathing and respiratory function stable Cardiovascular status: blood pressure returned to baseline and stable Postop Assessment: no apparent nausea or vomiting Anesthetic complications: no   No complications documented.   Last Vitals:  Vitals:   08/26/20 1101 08/26/20 1145  BP: (!) 140/92 (!) 126/95  Pulse: 82 75  Resp: 20   Temp: 36.6 C 36.6 C  SpO2: 96% 97%    Last Pain:  Vitals:   08/26/20 1145  TempSrc: Oral  PainSc: 0-No pain                 Nochum Fenter C Charlei Ramsaran

## 2020-08-26 NOTE — Discharge Instructions (Addendum)
Please discharge patient when stable, will follow up today with Dr. Wrzosek at the [facility] office immediately following discharge.  Leave shield in place until visit.  All paperwork with discharge instructions will be given at the office.  [facility] Address:  [address]  [place], [place] [address]  

## 2020-08-27 ENCOUNTER — Encounter (HOSPITAL_COMMUNITY): Payer: Self-pay | Admitting: Ophthalmology

## 2020-08-29 ENCOUNTER — Other Ambulatory Visit: Payer: Self-pay

## 2020-08-29 ENCOUNTER — Other Ambulatory Visit: Payer: Medicare HMO

## 2020-08-29 ENCOUNTER — Ambulatory Visit: Payer: Medicare HMO | Admitting: Physician Assistant

## 2020-08-29 ENCOUNTER — Other Ambulatory Visit: Payer: Self-pay | Admitting: Physician Assistant

## 2020-08-29 DIAGNOSIS — I872 Venous insufficiency (chronic) (peripheral): Secondary | ICD-10-CM | POA: Diagnosis not present

## 2020-08-29 DIAGNOSIS — E876 Hypokalemia: Secondary | ICD-10-CM | POA: Diagnosis not present

## 2020-08-29 DIAGNOSIS — Z0289 Encounter for other administrative examinations: Secondary | ICD-10-CM

## 2020-08-29 DIAGNOSIS — I5032 Chronic diastolic (congestive) heart failure: Secondary | ICD-10-CM

## 2020-08-29 NOTE — Addendum Note (Signed)
Addended by: Jacob Moores on: 08/29/2020 04:22 PM   Modules accepted: Orders

## 2020-08-30 LAB — BASIC METABOLIC PANEL
BUN: 20 mg/dL (ref 7–25)
CO2: 31 mmol/L (ref 20–32)
Calcium: 9.6 mg/dL (ref 8.6–10.4)
Chloride: 100 mmol/L (ref 98–110)
Creat: 0.85 mg/dL (ref 0.60–0.88)
Glucose, Bld: 100 mg/dL — ABNORMAL HIGH (ref 65–99)
Potassium: 3.6 mmol/L (ref 3.5–5.3)
Sodium: 142 mmol/L (ref 135–146)

## 2020-09-02 DIAGNOSIS — Z79891 Long term (current) use of opiate analgesic: Secondary | ICD-10-CM | POA: Diagnosis not present

## 2020-09-09 ENCOUNTER — Other Ambulatory Visit: Payer: Self-pay | Admitting: Family Medicine

## 2020-09-09 DIAGNOSIS — I10 Essential (primary) hypertension: Secondary | ICD-10-CM

## 2020-09-09 MED ORDER — CARVEDILOL 3.125 MG PO TABS: 3.1250 mg | ORAL_TABLET | Freq: Two times a day (BID) | ORAL | 1 refills | Status: DC

## 2020-09-09 MED ORDER — DROPLET PEN NEEDLES 32G X 4 MM MISC: 6 refills | Status: DC

## 2020-09-09 MED ORDER — PRAMIPEXOLE DIHYDROCHLORIDE 1 MG PO TABS: 1.0000 mg | ORAL_TABLET | Freq: Two times a day (BID) | ORAL | 1 refills | Status: DC

## 2020-09-09 MED ORDER — POTASSIUM CHLORIDE ER 10 MEQ PO TBCR: 20.0000 meq | EXTENDED_RELEASE_TABLET | Freq: Three times a day (TID) | ORAL | 0 refills | Status: DC

## 2020-09-09 NOTE — Progress Notes (Deleted)
Cardiology Office Note   Date:  09/09/2020   ID:  [name], DOB 01/24/1941, MRN [id number]  PCP:  Vanessa Leriche, PA-C  Cardiologist:   Vanessa Rotunda, MD   No chief complaint on file.     History of Present Illness: [name] is a 79 y.o. female who presents for evaluation of chronic diastolic dysfunction.   She also has had atrial fib.   She, after much discussion with her daughter and the patient, was started on Eliquis.  She has had good rate control on a monitor. ***   ***   She has had trouble with increasing swelling.  Since she saw me she actually had to be worked in to see Dr. Royann Cunningham because of increased swelling.  This was in July.  She has no new cardiovascular complaints.  She is very hard of hearing.  Her husband is with her but equally hard of hearing.  She gets around with a walker.  She is not describing new PND or orthopnea.  She did have nighttime respiratory difficulty but got BiPAP and says this is working well.  She is not had any presyncope or syncope.  She is not telling me that she has had any recent falls but she does have balance problems and walks with a walker.  She seems to have a limited medical understanding.  Today she is back in atrial fibrillation which is new.  She does not notice her heart racing or skipping.    Past Medical History:  Diagnosis Date  . AK (actinic keratosis)   . Anxiety    occasional  . Breast cancer (HCC)    R breast, s/p radiation 34Gy/55fx 04/29/10-05/05/10  . CTS (carpal tunnel syndrome)    Left  . Diabetes (HCC)   . Diastolic CHF (HCC)    Dx w/ hospitalization 06/2015 for respiratory failure  . Facial asymmetry, acquired 08/20/2016   From injury  . Falls frequently    "can not pick left leg up"  . Hearing loss    bilateral hearing aides  . History of environmental allergies   . History of kidney stones 20 years ago  . Hypertension   . MDD (major depressive disorder)   . No blood products (Jehovah  Witness)  09/11/2013  . Osteoarthritis    hx shoulder, knee, back, wrist pain; saw Dr. Lequita Halt   . Personal history of radiation therapy   . RESTLESS LEGS SYNDROME 05/05/2007   Qualifier: Diagnosis of  By: Shelle Iron MD, Maree Krabbe   . RLS (restless legs syndrome)   . Sleep apnea     Past Surgical History:  Procedure Laterality Date  . APPENDECTOMY  age 57  . BREAST LUMPECTOMY Right 2012  . BREAST SURGERY  2012   rt breast lumpectomy  . CATARACT EXTRACTION W/PHACO Left 08/12/2020   Procedure: CATARACT EXTRACTION PHACO AND INTRAOCULAR LENS PLACEMENT LEFT EYE;  Surgeon: Fabio Pierce, MD;  Location: [facility];  Service: Ophthalmology;  Laterality: Left;  left CDE:13.68  . CATARACT EXTRACTION W/PHACO Right 08/26/2020   Procedure: CATARACT EXTRACTION PHACO AND INTRAOCULAR LENS PLACEMENT RIGHT EYE;  Surgeon: Fabio Pierce, MD;  Location: [facility];  Service: Ophthalmology;  Laterality: Right;  right CDE=12.48  . flex signoidoscopy    . INCISION AND DRAINAGE PERIRECTAL ABSCESS    . JOINT REPLACEMENT Right 2006   knee  . REPLACEMENT TOTAL KNEE Right   . TOTAL KNEE ARTHROPLASTY Left 08/06/2014   Procedure: LEFT TOTAL KNEE ARTHROPLASTY;  Surgeon: Gaynelle Arabian, MD;  Location: [facility];  Service: Orthopedics;  Laterality: Left;  . TUBAL LIGATION  1979     Current Outpatient Medications  Medication Sig Dispense Refill  . apixaban (ELIQUIS) 5 MG TABS tablet Take 1 tablet (5 mg total) by mouth 2 (two) times daily. 60 tablet 11  . Ascorbic Acid (VITAMIN C PO) Take 2 tablets by mouth daily.    . Calcium Citrate-Vitamin D (CALCIUM + D PO) Take 2 tablets by mouth daily.    . carvedilol (COREG) 3.125 MG tablet Take 1 tablet (3.125 mg total) by mouth 2 (two) times daily with a meal. 180 tablet 1  . Cholecalciferol (VITAMIN D3) 50 MCG (2000 UT) CHEW Chew 4,000 Units by mouth daily.    . Cyanocobalamin (B-12 PO) Take 2 tablets by mouth daily.    . furosemide (LASIX) 80 MG tablet Take 1 tablet (80 mg total) by  mouth 3 (three) times daily. 270 tablet 1  . HYDROcodone-acetaminophen (NORCO/VICODIN) 5-325 MG tablet Take 1 tablet by mouth 2 (two) times daily as needed for moderate pain.    . Insulin Pen Needle (DROPLET PEN NEEDLES) 32G X 4 MM MISC USE AS DIRECTED ONE TIME DAILY 100 each 6  . LANTUS SOLOSTAR 100 UNIT/ML Solostar Pen INJECT 25 UNITS INTO THE SKIN AT BEDTIME. 30 mL 2  . lisinopril (ZESTRIL) 20 MG tablet TAKE 1 TABLET (20 MG TOTAL) BY MOUTH DAILY. 90 tablet 2  . metFORMIN (GLUCOPHAGE) 1000 MG tablet TAKE 1 TABLET TWICE DAILY (Patient taking differently: Take 1,000 mg by mouth in the morning and at bedtime.) 180 tablet 1  . potassium chloride (KLOR-CON) 10 MEQ tablet Take 2 tablets (20 mEq total) by mouth 3 (three) times daily. Take with lasix 180 tablet 0  . pramipexole (MIRAPEX) 1 MG tablet Take 1 tablet (1 mg total) by mouth 2 (two) times daily. 180 tablet 1  . triamcinolone (KENALOG) 0.1 % Apply to red areas on legs twice a day as needed (Patient taking differently: Apply 1 application topically 2 (two) times daily as needed (redness). Apply to red areas on legs) 45 g 0   No current facility-administered medications for this visit.    Allergies:   Sulfa antibiotics, Sulfonamide derivatives, and Other    ROS:  Please see the history of present illness.   Otherwise, review of systems are positive for ***.   All other systems are reviewed and negative.    PHYSICAL EXAM: VS:  There were no vitals taken for this visit. , BMI There is no height or weight on file to calculate BMI. GENERAL:  Well appearing NECK:  No jugular venous distention, waveform within normal limits, carotid upstroke brisk and symmetric, no bruits, no thyromegaly LUNGS:  Clear to auscultation bilaterally CHEST:  Unremarkable HEART:  PMI not displaced or sustained,S1 and S2 within normal limits, no S3,  no clicks, no rubs, *** murmurs, irregular ABD:  Flat, positive bowel sounds normal in frequency in pitch, no bruits, no  rebound, no guarding, no midline pulsatile mass, no hepatomegaly, no splenomegaly EXT:  2 plus pulses throughout, no edema, no cyanosis no clubbing    ***GENERAL:  Well appearing HEENT:  Pupils equal round and reactive, fundi not visualized, oral mucosa unremarkable NECK:  Positive jugular venous distention, waveform within normal limits, carotid upstroke brisk and symmetric, no bruits, no thyromegaly LYMPHATICS:  No cervical, inguinal adenopathy LUNGS:  Clear to auscultation bilaterally BACK:  No CVA tenderness CHEST:  Unremarkable HEART:  PMI  not displaced or sustained,S1 and S2 within normal limits, no S3, no clicks, no rubs, no murmurs, irregular  ABD:  Flat, positive bowel sounds normal in frequency in pitch, no bruits, no rebound, no guarding, no midline pulsatile mass, no hepatomegaly, no splenomegaly EXT:  2 plus pulses throughout, moderately severe edema, no cyanosis no clubbing SKIN:  No rashes no nodules NEURO:  Cranial nerves II through XII grossly intact, motor grossly intact throughout PSYCH:  Cognitively intact, oriented to person place and time    EKG:  EKG is *** ordered today. The ekg ordered today demonstrates atrial fibrillation, rate ***, poor anterior R wave progression, low voltage in the limb leads, left axis deviation.   Recent Labs: 12/08/2019: BNP 264.5 05/27/2020: Hemoglobin 14.3; Platelets 269 06/11/2020: ALT 13 08/29/2020: BUN 20; Creat 0.85; Potassium 3.6; Sodium 142    Lipid Panel    Component Value Date/Time   CHOL 101 06/11/2020 1032   TRIG 95.0 06/11/2020 1032   HDL 31.40 (L) 06/11/2020 1032   CHOLHDL 3 06/11/2020 1032   VLDL 19.0 06/11/2020 1032   LDLCALC 51 06/11/2020 1032   LDLDIRECT 111.6 02/14/2010 1406      Wt Readings from Last 3 Encounters:  08/21/20 196 lb 8 oz (89.1 kg)  08/07/20 196 lb 8 oz (89.1 kg)  07/31/20 196 lb 3.2 oz (89 kg)     Echo:  06/2019  1. Left ventricular ejection fraction, by estimation, is 60 to 65%. The   left ventricle has normal function. The left ventricle has no regional  wall motion abnormalities. There is mild to moderate left ventricular  hypertrophy. Left ventricular diastolic  parameters are consistent with Grade I diastolic dysfunction (impaired  relaxation).  2. Right ventricular systolic function is normal. The right ventricular  size is normal. Tricuspid regurgitation signal is inadequate for assessing  PA pressure.  3. The mitral valve is grossly normal, there is mild to moderate annular  calcification. Trivial mitral valve regurgitation.  4. The aortic valve was not well visualized. Mild aortic leaflet  calcification. Aortic valve regurgitation is mild.  5. IVC dilated, but not able to estimate CVP (no sniff).   Other studies Reviewed: Additional studies/ records that were reviewed today include: ***. Review of the above records demonstrates:  Please see elsewhere in the note.     ASSESSMENT AND PLAN:  Chronic diastolic HF:   ***  She seems to have chronic diastolic dysfunction.  This is a very significant social situation has she does have kids that live locally but they are not apparently caregivers.  The daughter who is a caregiver is a Press photographer who lives in [place].  I actually called her today and had a long conversation.  The patient is post be on 80 mg of Lasix twice a day.  I am not sure that she is doing this.  She needs to keep her feet elevated.  She obviously needs salt and fluid and she needs to have that restricted.  Her daughter is coming home in a few days and will look into all of this.  HTN: Her blood pressure is *** reasonably well controlled.  No change in therapy.  Respiratory Failure:   She is on BiPAP.    ***   ATRIAL FIBRILLATION:    Ms. SANTIAGA BUTZIN has a CHA2DS2 - VASc score of 6.   We have had very long discussions with the patient and her daughter with shared decision making.  ***  This is a new  diagnosis.  I explained this to her.  I then  called her daughter and spoke to her about this quite a while.  I am going to send a 3-day monitor to make sure that she has reasonable rate control and to see if it is persistent or chronic.  After much consideration I think she should be on 5 mg twice a day of Eliquis.  I will check a CBC.  I do not think that she is falling currently but her daughter is going to check on this.  There is no evidence of bleeding or high bleeding risk.  Ms. DHANYA BOGLE has a CHA2DS2 - VASc score of 4.    Her daughter is going to instruct me if she thinks she is at high fall risk and we might need to just stop this.  She is not to be taking any aspirin products.  Current medicines are reviewed at length with the patient today.  The patient does not have concerns regarding medicines.  The following changes have been made:   ***  Labs/ tests ordered today include: ***  No orders of the defined types were placed in this encounter.   Disposition:   FU with ***   Signed, Minus Breeding, MD  09/09/2020 6:53 PM    [facility]

## 2020-09-10 ENCOUNTER — Ambulatory Visit: Payer: Medicare HMO | Admitting: Cardiology

## 2020-09-10 DIAGNOSIS — I5032 Chronic diastolic (congestive) heart failure: Secondary | ICD-10-CM

## 2020-09-10 DIAGNOSIS — I4819 Other persistent atrial fibrillation: Secondary | ICD-10-CM

## 2020-09-10 DIAGNOSIS — I10 Essential (primary) hypertension: Secondary | ICD-10-CM

## 2020-09-13 ENCOUNTER — Encounter: Payer: Self-pay | Admitting: Physician Assistant

## 2020-09-26 DIAGNOSIS — I4819 Other persistent atrial fibrillation: Secondary | ICD-10-CM | POA: Insufficient documentation

## 2020-09-26 NOTE — Progress Notes (Signed)
Cardiology Office Note   Date:  09/27/2020   ID:  [name], DOB May 18, 1941, MRN [id number]  PCP:  Fredirick Lathe, PA-C  Cardiologist:   Minus Breeding, MD   Chief Complaint  Patient presents with  . Leg Swelling      History of Present Illness: [name] is a 80 y.o. female who presents for evaluation of chronic diastolic dysfunction.   She also has had atrial fib.   She, after much discussion with her daughter and the patient, was started on Eliquis.  She has had good rate control on a monitor.   I had a long discussion with her daughter who lives in [place] after initially diagnosing her atrial fibrillation and we came to the decision to use this as she is high risk for thromboembolism and has not been falling recently.  She has had a history of falls.  She did slide off the toilet recently but she is careful not to fall with her walker.  She occasionally feels her palpitations.  She is tolerating blood thinners had indication of bleeding.  She had no new shortness of breath, PND or orthopnea.  She has chronic dyspnea.  She does wear BiPAP at night.   Past Medical History:  Diagnosis Date  . AK (actinic keratosis)   . Anxiety    occasional  . Breast cancer ([place])    R breast, s/p radiation 34Gy/25fx 04/29/10-05/05/10  . CTS (carpal tunnel syndrome)    Left  . Diabetes ([place])   . Diastolic CHF ([place])    Dx w/ hospitalization 06/2015 for respiratory failure  . Facial asymmetry, acquired 08/20/2016   From injury  . Falls frequently    "can not pick left leg up"  . Hearing loss    bilateral hearing aides  . History of environmental allergies   . History of kidney stones 20 years ago  . Hypertension   . MDD (major depressive disorder)   . No blood products ([place])  09/11/2013  . Osteoarthritis    hx shoulder, knee, back, wrist pain; saw Dr. Wynelle Link   . Personal history of radiation therapy   . RESTLESS LEGS SYNDROME 05/05/2007   Qualifier: Diagnosis of   By: Gwenette Greet MD, Armando Reichert   . RLS (restless legs syndrome)   . Sleep apnea     Past Surgical History:  Procedure Laterality Date  . APPENDECTOMY  age 39  . BREAST LUMPECTOMY Right 2012  . BREAST SURGERY  2012   rt breast lumpectomy  . CATARACT EXTRACTION W/PHACO Left 08/12/2020   Procedure: CATARACT EXTRACTION PHACO AND INTRAOCULAR LENS PLACEMENT LEFT EYE;  Surgeon: Baruch Goldmann, MD;  Location: [facility];  Service: Ophthalmology;  Laterality: Left;  left CDE:13.68  . CATARACT EXTRACTION W/PHACO Right 08/26/2020   Procedure: CATARACT EXTRACTION PHACO AND INTRAOCULAR LENS PLACEMENT RIGHT EYE;  Surgeon: Baruch Goldmann, MD;  Location: [facility];  Service: Ophthalmology;  Laterality: Right;  right CDE=12.48  . flex signoidoscopy    . INCISION AND DRAINAGE PERIRECTAL ABSCESS    . JOINT REPLACEMENT Right 2006   knee  . REPLACEMENT TOTAL KNEE Right   . TOTAL KNEE ARTHROPLASTY Left 08/06/2014   Procedure: LEFT TOTAL KNEE ARTHROPLASTY;  Surgeon: Gaynelle Arabian, MD;  Location: [facility];  Service: Orthopedics;  Laterality: Left;  . TUBAL LIGATION  1979     Current Outpatient Medications  Medication Sig Dispense Refill  . apixaban (ELIQUIS) 5 MG TABS tablet Take 1 tablet (5 mg  total) by mouth 2 (two) times daily. 60 tablet 11  . Ascorbic Acid (VITAMIN C PO) Take 2 tablets by mouth daily.    . Calcium Citrate-Vitamin D (CALCIUM + D PO) Take 2 tablets by mouth daily.    . carvedilol (COREG) 3.125 MG tablet Take 1 tablet (3.125 mg total) by mouth 2 (two) times daily with a meal. 180 tablet 1  . Cholecalciferol (VITAMIN D3) 50 MCG (2000 UT) CHEW Chew 4,000 Units by mouth daily.    . Cyanocobalamin (B-12 PO) Take 2 tablets by mouth daily.    . furosemide (LASIX) 80 MG tablet Take 1 tablet (80 mg total) by mouth 3 (three) times daily. 270 tablet 1  . HYDROcodone-acetaminophen (NORCO/VICODIN) 5-325 MG tablet Take 1 tablet by mouth 2 (two) times daily as needed for moderate pain.    . Insulin Pen Needle  (DROPLET PEN NEEDLES) 32G X 4 MM MISC USE AS DIRECTED ONE TIME DAILY 100 each 6  . LANTUS SOLOSTAR 100 UNIT/ML Solostar Pen INJECT 25 UNITS INTO THE SKIN AT BEDTIME. 30 mL 2  . lisinopril (ZESTRIL) 20 MG tablet TAKE 1 TABLET (20 MG TOTAL) BY MOUTH DAILY. 90 tablet 2  . metFORMIN (GLUCOPHAGE) 1000 MG tablet TAKE 1 TABLET TWICE DAILY (Patient taking differently: Take 1,000 mg by mouth in the morning and at bedtime.) 180 tablet 1  . potassium chloride (KLOR-CON) 10 MEQ tablet Take 2 tablets (20 mEq total) by mouth 3 (three) times daily. Take with lasix 180 tablet 0  . pramipexole (MIRAPEX) 1 MG tablet Take 1 tablet (1 mg total) by mouth 2 (two) times daily. 180 tablet 1  . triamcinolone (KENALOG) 0.1 % Apply to red areas on legs twice a day as needed (Patient taking differently: Apply 1 application topically 2 (two) times daily as needed (redness). Apply to red areas on legs) 45 g 0   No current facility-administered medications for this visit.    Allergies:   Sulfa antibiotics, Sulfonamide derivatives, and Other    ROS:  Please see the history of present illness.   Otherwise, review of systems are positive for none.   All other systems are reviewed and negative.    PHYSICAL EXAM: VS:  BP 128/82   Pulse 79   Ht 5\' 1"  (1.549 m)   Wt 197 lb (89.4 kg)   SpO2 92%   BMI 37.22 kg/m  , BMI Body mass index is 37.22 kg/m. GENERAL: Frail appearing NECK:  No jugular venous distention, waveform within normal limits, carotid upstroke brisk and symmetric, no bruits, no thyromegaly LUNGS:  Clear to auscultation bilaterally CHEST:  Unremarkable HEART:  PMI not displaced or sustained,S1 and S2 within normal limits, no S3,  no clicks, no rubs, no murmurs, irregular ABD:  Flat, positive bowel sounds normal in frequency in pitch, no bruits, no rebound, no guarding, no midline pulsatile mass, no hepatomegaly, no splenomegaly EXT:  2 plus pulses throughout, mild leg edema, no cyanosis no clubbing   EKG:   EKG is not ordered today.    Recent Labs: 12/08/2019: BNP 264.5 05/27/2020: Hemoglobin 14.3; Platelets 269 06/11/2020: ALT 13 08/29/2020: BUN 20; Creat 0.85; Potassium 3.6; Sodium 142    Lipid Panel    Component Value Date/Time   CHOL 101 06/11/2020 1032   TRIG 95.0 06/11/2020 1032   HDL 31.40 (L) 06/11/2020 1032   CHOLHDL 3 06/11/2020 1032   VLDL 19.0 06/11/2020 1032   LDLCALC 51 06/11/2020 1032   LDLDIRECT 111.6 02/14/2010 1406  Wt Readings from Last 3 Encounters:  09/27/20 197 lb (89.4 kg)  08/21/20 196 lb 8 oz (89.1 kg)  08/07/20 196 lb 8 oz (89.1 kg)     Echo:  06/2019  1. Left ventricular ejection fraction, by estimation, is 60 to 65%. The  left ventricle has normal function. The left ventricle has no regional  wall motion abnormalities. There is mild to moderate left ventricular  hypertrophy. Left ventricular diastolic  parameters are consistent with Grade I diastolic dysfunction (impaired  relaxation).  2. Right ventricular systolic function is normal. The right ventricular  size is normal. Tricuspid regurgitation signal is inadequate for assessing  PA pressure.  3. The mitral valve is grossly normal, there is mild to moderate annular  calcification. Trivial mitral valve regurgitation.  4. The aortic valve was not well visualized. Mild aortic leaflet  calcification. Aortic valve regurgitation is mild.  5. IVC dilated, but not able to estimate CVP (no sniff).   Other studies Reviewed: Additional studies/ records that were reviewed today include: Labs. Review of the above records demonstrates:  Please see elsewhere in the note.     ASSESSMENT AND PLAN:  Chronic diastolic HF:     She seems to be euvolemic.  She is taking the Lasix as prescribed most of the time if she does not forget it.  She had some mild lower extremity swelling but this seems to be improved compared to previous.  I will check a basic metabolic profile today.   HTN: Her blood  pressure is at target.  No change in therapy.   Respiratory Failure:   She is on BiPAP.   Breathing is at baseline.       ATRIAL FIBRILLATION:    [name] has a CHA2DS2 - VASc score of 6.   We have had very long discussions with the patient and her daughter with shared decision making.   I will check a CBC today.  She seems to be tolerating meds as listed.  She is on the appropriate dose.  Current medicines are reviewed at length with the patient today.  The patient does not have concerns regarding medicines.  The following changes have been made:   None  Labs/ tests ordered today include:   Orders Placed This Encounter  Procedures  . Basic Metabolic Panel (BMET)  . CBC    Disposition:   FU with APP in four months.     Signed, Minus Breeding, MD  09/27/2020 12:55 PM     [facility]

## 2020-09-27 ENCOUNTER — Other Ambulatory Visit: Payer: Self-pay

## 2020-09-27 ENCOUNTER — Encounter: Payer: Self-pay | Admitting: Cardiology

## 2020-09-27 ENCOUNTER — Ambulatory Visit: Payer: Medicare HMO | Admitting: Cardiology

## 2020-09-27 VITALS — BP 128/82 | HR 79 | Ht 61.0 in | Wt 197.0 lb

## 2020-09-27 DIAGNOSIS — I4819 Other persistent atrial fibrillation: Secondary | ICD-10-CM

## 2020-09-27 DIAGNOSIS — I5032 Chronic diastolic (congestive) heart failure: Secondary | ICD-10-CM | POA: Diagnosis not present

## 2020-09-27 DIAGNOSIS — I10 Essential (primary) hypertension: Secondary | ICD-10-CM

## 2020-09-27 NOTE — Patient Instructions (Signed)
  Follow-Up: At [facility], you and your health needs are our priority.  As part of our continuing mission to provide you with exceptional heart care, we have created designated Provider Care Teams.  These Care Teams include your primary Cardiologist (physician) and Advanced Practice Providers (APPs -  Physician Assistants and Nurse Practitioners) who all work together to provide you with the care you need, when you need it.  We recommend signing up for the patient portal called "MyChart".  Sign up information is provided on this After Visit Summary.  MyChart is used to connect with patients for Virtual Visits (Telemedicine).  Patients are able to view lab/test results, encounter notes, upcoming appointments, etc.  Non-urgent messages can be sent to your provider as well.   To learn more about what you can do with MyChart, go to [email or web address].    Your next appointment:   6 month(s)  The format for your next appointment:   In Person  Provider:   You will see one of the following Advanced Practice Providers on your designated Care Team:    Rosaria Ferries, PA-C  Jory Sims, DNP, ANP  Then, Minus Breeding, MD will plan to see you again in 12 month(s).

## 2020-09-28 LAB — CBC
Hematocrit: 41.4 % (ref 34.0–46.6)
Hemoglobin: 14 g/dL (ref 11.1–15.9)
MCH: 32.3 pg (ref 26.6–33.0)
MCHC: 33.8 g/dL (ref 31.5–35.7)
MCV: 95 fL (ref 79–97)
Platelets: 240 10*3/uL (ref 150–450)
RBC: 4.34 x10E6/uL (ref 3.77–5.28)
RDW: 12.7 % (ref 11.7–15.4)
WBC: 8.8 10*3/uL (ref 3.4–10.8)

## 2020-09-28 LAB — BASIC METABOLIC PANEL
BUN/Creatinine Ratio: 21 (ref 12–28)
BUN: 19 mg/dL (ref 8–27)
CO2: 26 mmol/L (ref 20–29)
Calcium: 9 mg/dL (ref 8.7–10.3)
Chloride: 100 mmol/L (ref 96–106)
Creatinine, Ser: 0.91 mg/dL (ref 0.57–1.00)
Glucose: 93 mg/dL (ref 65–99)
Potassium: 3.9 mmol/L (ref 3.5–5.2)
Sodium: 143 mmol/L (ref 134–144)
eGFR: 63 mL/min/{1.73_m2} (ref >59)

## 2020-10-01 ENCOUNTER — Other Ambulatory Visit: Payer: Self-pay | Admitting: Family Medicine

## 2020-10-01 DIAGNOSIS — H52229 Regular astigmatism, unspecified eye: Secondary | ICD-10-CM | POA: Diagnosis not present

## 2020-10-01 DIAGNOSIS — Z0100 Encounter for examination of eyes and vision without abnormal findings: Secondary | ICD-10-CM | POA: Diagnosis not present

## 2020-10-07 ENCOUNTER — Ambulatory Visit (INDEPENDENT_AMBULATORY_CARE_PROVIDER_SITE_OTHER): Payer: Medicare HMO

## 2020-10-07 DIAGNOSIS — Z0000 Encounter for general adult medical examination without abnormal findings: Secondary | ICD-10-CM

## 2020-10-07 DIAGNOSIS — Z742 Need for assistance at home and no other household member able to render care: Secondary | ICD-10-CM

## 2020-10-07 DIAGNOSIS — Z79899 Other long term (current) drug therapy: Secondary | ICD-10-CM

## 2020-10-07 NOTE — Patient Instructions (Signed)
[name] , Thank you for taking time to come for your Medicare Wellness Visit. I appreciate your ongoing commitment to your health goals. Please review the following plan we discussed and let me know if I can assist you in the future.   Screening recommendations/referrals: Colonoscopy: needs Annual fecal occult /Cologuard  Mammogram: Done 12/07/19 Bone Density: Done 12/07/19 Recommended yearly ophthalmology/optometry visit for glaucoma screening and checkup Recommended yearly dental visit for hygiene and checkup  Vaccinations: Influenza vaccine: Due and discussed  Pneumococcal vaccine: Up to date Tdap vaccine: Up to date Shingles vaccine: Shingrix discussed. Please contact your pharmacy for coverage information.    Covid-19:Completed 1/19,  06/26/19 and 07/01/20  Advanced directives: Copies in chart   Conditions/risks identified: stay healthy and survive   Next appointment: Follow up in one year for your annual wellness visit    Preventive Care 65 Years and Older, Female Preventive care refers to lifestyle choices and visits with your health care provider that can promote health and wellness. What does preventive care include?  A yearly physical exam. This is also called an annual well check.  Dental exams once or twice a year.  Routine eye exams. Ask your health care provider how often you should have your eyes checked.  Personal lifestyle choices, including:  Daily care of your teeth and gums.  Regular physical activity.  Eating a healthy diet.  Avoiding tobacco and drug use.  Limiting alcohol use.  Practicing safe sex.  Taking low-dose aspirin every day.  Taking vitamin and mineral supplements as recommended by your health care provider. What happens during an annual well check? The services and screenings done by your health care provider during your annual well check will depend on your age, overall health, lifestyle risk factors, and family history of  disease. Counseling  Your health care provider may ask you questions about your:  Alcohol use.  Tobacco use.  Drug use.  Emotional well-being.  Home and relationship well-being.  Sexual activity.  Eating habits.  History of falls.  Memory and ability to understand (cognition).  Work and work Statistician.  Reproductive health. Screening  You may have the following tests or measurements:  Height, weight, and BMI.  Blood pressure.  Lipid and cholesterol levels. These may be checked every 5 years, or more frequently if you are over 49 years old.  Skin check.  Lung cancer screening. You may have this screening every year starting at age 65 if you have a 30-pack-year history of smoking and currently smoke or have quit within the past 15 years.  Fecal occult blood test (FOBT) of the stool. You may have this test every year starting at age 48.  Flexible sigmoidoscopy or colonoscopy. You may have a sigmoidoscopy every 5 years or a colonoscopy every 10 years starting at age 66.  Hepatitis C blood test.  Hepatitis B blood test.  Sexually transmitted disease (STD) testing.  Diabetes screening. This is done by checking your blood sugar (glucose) after you have not eaten for a while (fasting). You may have this done every 1-3 years.  Bone density scan. This is done to screen for osteoporosis. You may have this done starting at age 36.  Mammogram. This may be done every 1-2 years. Talk to your health care provider about how often you should have regular mammograms. Talk with your health care provider about your test results, treatment options, and if necessary, the need for more tests. Vaccines  Your health care provider may recommend certain  vaccines, such as:  Influenza vaccine. This is recommended every year.  Tetanus, diphtheria, and acellular pertussis (Tdap, Td) vaccine. You may need a Td booster every 10 years.  Zoster vaccine. You may need this after age  59.  Pneumococcal 13-valent conjugate (PCV13) vaccine. One dose is recommended after age 59.  Pneumococcal polysaccharide (PPSV23) vaccine. One dose is recommended after age 21. Talk to your health care provider about which screenings and vaccines you need and how often you need them. This information is not intended to replace advice given to you by your health care provider. Make sure you discuss any questions you have with your health care provider. Document Released: 05/31/2015 Document Revised: 01/22/2016 Document Reviewed: 03/05/2015 Elsevier Interactive Patient Education  2017 [place] Prevention in the Home Falls can cause injuries. They can happen to people of all ages. There are many things you can do to make your home safe and to help prevent falls. What can I do on the outside of my home?  Regularly fix the edges of walkways and driveways and fix any cracks.  Remove anything that might make you trip as you walk through a door, such as a raised step or threshold.  Trim any bushes or trees on the path to your home.  Use bright outdoor lighting.  Clear any walking paths of anything that might make someone trip, such as rocks or tools.  Regularly check to see if handrails are loose or broken. Make sure that both sides of any steps have handrails.  Any raised decks and porches should have guardrails on the edges.  Have any leaves, snow, or ice cleared regularly.  Use sand or salt on walking paths during winter.  Clean up any spills in your garage right away. This includes oil or grease spills. What can I do in the bathroom?  Use night lights.  Install grab bars by the toilet and in the tub and shower. Do not use towel bars as grab bars.  Use non-skid mats or decals in the tub or shower.  If you need to sit down in the shower, use a plastic, non-slip stool.  Keep the floor dry. Clean up any water that spills on the floor as soon as it happens.  Remove  soap buildup in the tub or shower regularly.  Attach bath mats securely with double-sided non-slip rug tape.  Do not have throw rugs and other things on the floor that can make you trip. What can I do in the bedroom?  Use night lights.  Make sure that you have a light by your bed that is easy to reach.  Do not use any sheets or blankets that are too big for your bed. They should not hang down onto the floor.  Have a firm chair that has side arms. You can use this for support while you get dressed.  Do not have throw rugs and other things on the floor that can make you trip. What can I do in the kitchen?  Clean up any spills right away.  Avoid walking on wet floors.  Keep items that you use a lot in easy-to-reach places.  If you need to reach something above you, use a strong step stool that has a grab bar.  Keep electrical cords out of the way.  Do not use floor polish or wax that makes floors slippery. If you must use wax, use non-skid floor wax.  Do not have throw rugs and other things  on the floor that can make you trip. What can I do with my stairs?  Do not leave any items on the stairs.  Make sure that there are handrails on both sides of the stairs and use them. Fix handrails that are broken or loose. Make sure that handrails are as long as the stairways.  Check any carpeting to make sure that it is firmly attached to the stairs. Fix any carpet that is loose or worn.  Avoid having throw rugs at the top or bottom of the stairs. If you do have throw rugs, attach them to the floor with carpet tape.  Make sure that you have a light switch at the top of the stairs and the bottom of the stairs. If you do not have them, ask someone to add them for you. What else can I do to help prevent falls?  Wear shoes that:  Do not have high heels.  Have rubber bottoms.  Are comfortable and fit you well.  Are closed at the toe. Do not wear sandals.  If you use a  stepladder:  Make sure that it is fully opened. Do not climb a closed stepladder.  Make sure that both sides of the stepladder are locked into place.  Ask someone to hold it for you, if possible.  Clearly mark and make sure that you can see:  Any grab bars or handrails.  First and last steps.  Where the edge of each step is.  Use tools that help you move around (mobility aids) if they are needed. These include:  Canes.  Walkers.  Scooters.  Crutches.  Turn on the lights when you go into a dark area. Replace any light bulbs as soon as they burn out.  Set up your furniture so you have a clear path. Avoid moving your furniture around.  If any of your floors are uneven, fix them.  If there are any pets around you, be aware of where they are.  Review your medicines with your doctor. Some medicines can make you feel dizzy. This can increase your chance of falling. Ask your doctor what other things that you can do to help prevent falls. This information is not intended to replace advice given to you by your health care provider. Make sure you discuss any questions you have with your health care provider. Document Released: 02/28/2009 Document Revised: 10/10/2015 Document Reviewed: 06/08/2014 Elsevier Interactive Patient Education  2017 [employer].

## 2020-10-07 NOTE — Progress Notes (Addendum)
Virtual Visit via Telephone Note  I connected with  [name] on 10/07/20 at 10:15 AM EDT by telephone and verified that I am speaking with the correct person using two identifiers.  Medicare Annual Wellness visit completed telephonically due to Covid-19 pandemic.   Persons participating in this call: This Health Coach and this patient.   Location: Patient: Home Provider: Office    I discussed the limitations, risks, security and privacy concerns of performing an evaluation and management service by telephone and the availability of in person appointments. The patient expressed understanding and agreed to proceed.  Unable to perform video visit due to video visit attempted and failed and/or patient does not have video capability.   Some vital signs may be absent or patient reported.   Willette Brace, LPN    Subjective:   [name] is a 80 y.o. female who presents for Medicare Annual (Subsequent) preventive examination.  Review of Systems     Cardiac Risk Factors include: advanced age (>21men, >27 women);diabetes mellitus;hypertension;dyslipidemia;obesity (BMI >30kg/m2)     Objective:    There were no vitals filed for this visit. There is no height or weight on file to calculate BMI.  Advanced Directives 10/07/2020 08/26/2020 08/21/2020 08/12/2020 08/06/2020 09/06/2019 07/07/2019  Does Patient Have a Medical Advance Directive? Yes No Yes Yes Yes Yes No  Type of Paramedic of [place];Living will - [place];Living will [place];Living will Living will;Healthcare Power of Attorney Living will;Healthcare Power of Attorney Living will;Healthcare Power of Attorney  Does patient want to make changes to medical advance directive? - No - Patient declined No - Patient declined No - Patient declined No - Patient declined No - Patient declined -  Copy of [place] in Chart? Yes - validated most recent copy  scanned in chart (See row information) - No - copy requested - No - copy requested No - copy requested No - copy requested  Would patient like information on creating a medical advance directive? - No - Patient declined - - - - No - Patient declined    Current Medications (verified) Outpatient Encounter Medications as of 10/07/2020  Medication Sig  . apixaban (ELIQUIS) 5 MG TABS tablet Take 1 tablet (5 mg total) by mouth 2 (two) times daily.  . Ascorbic Acid (VITAMIN C PO) Take 2 tablets by mouth daily.  . Calcium Citrate-Vitamin D (CALCIUM + D PO) Take 2 tablets by mouth daily.  . carvedilol (COREG) 3.125 MG tablet Take 1 tablet (3.125 mg total) by mouth 2 (two) times daily with a meal.  . Cholecalciferol (VITAMIN D3) 50 MCG (2000 UT) CHEW Chew 4,000 Units by mouth daily.  . Cyanocobalamin (B-12 PO) Take 2 tablets by mouth daily.  . furosemide (LASIX) 80 MG tablet Take 1 tablet (80 mg total) by mouth 3 (three) times daily.  Marland Kitchen HYDROcodone-acetaminophen (NORCO/VICODIN) 5-325 MG tablet Take 1 tablet by mouth 2 (two) times daily as needed for moderate pain.  . Insulin Pen Needle (DROPLET PEN NEEDLES) 32G X 4 MM MISC USE AS DIRECTED ONE TIME DAILY  . LANTUS SOLOSTAR 100 UNIT/ML Solostar Pen INJECT 25 UNITS INTO THE SKIN AT BEDTIME.  Marland Kitchen lisinopril (ZESTRIL) 20 MG tablet TAKE 1 TABLET (20 MG TOTAL) BY MOUTH DAILY.  . metFORMIN (GLUCOPHAGE) 1000 MG tablet TAKE 1 TABLET TWICE DAILY (Patient taking differently: Take 1,000 mg by mouth in the morning and at bedtime.)  . potassium chloride (KLOR-CON) 10  MEQ tablet TAKE 2 TABLETS (20 MEQ TOTAL) BY MOUTH 3 TIMES DAILY. TAKE WITH LASIX  . pramipexole (MIRAPEX) 1 MG tablet Take 1 tablet (1 mg total) by mouth 2 (two) times daily.  Marland Kitchen triamcinolone (KENALOG) 0.1 % Apply to red areas on legs twice a day as needed (Patient taking differently: Apply 1 application topically 2 (two) times daily as needed (redness). Apply to red areas on legs)   No facility-administered  encounter medications on file as of 10/07/2020.    Allergies (verified) Sulfa antibiotics, Sulfonamide derivatives, and Other   History: Past Medical History:  Diagnosis Date  . AK (actinic keratosis)   . Anxiety    occasional  . Breast cancer ([place])    R breast, s/p radiation 34Gy/59fx 04/29/10-05/05/10  . CTS (carpal tunnel syndrome)    Left  . Diabetes ([place])   . Diastolic CHF ([place])    Dx w/ hospitalization 06/2015 for respiratory failure  . Facial asymmetry, acquired 08/20/2016   From injury  . Falls frequently    "can not pick left leg up"  . Hearing loss    bilateral hearing aides  . History of environmental allergies   . History of kidney stones 20 years ago  . Hypertension   . MDD (major depressive disorder)   . No blood products ([place])  09/11/2013  . Osteoarthritis    hx shoulder, knee, back, wrist pain; saw Dr. Wynelle Link   . Personal history of radiation therapy   . RESTLESS LEGS SYNDROME 05/05/2007   Qualifier: Diagnosis of  By: Gwenette Greet MD, Armando Reichert   . RLS (restless legs syndrome)   . Sleep apnea    Past Surgical History:  Procedure Laterality Date  . APPENDECTOMY  age 15  . BREAST LUMPECTOMY Right 2012  . BREAST SURGERY  2012   rt breast lumpectomy  . CATARACT EXTRACTION W/PHACO Left 08/12/2020   Procedure: CATARACT EXTRACTION PHACO AND INTRAOCULAR LENS PLACEMENT LEFT EYE;  Surgeon: Baruch Goldmann, MD;  Location: [facility];  Service: Ophthalmology;  Laterality: Left;  left CDE:13.68  . CATARACT EXTRACTION W/PHACO Right 08/26/2020   Procedure: CATARACT EXTRACTION PHACO AND INTRAOCULAR LENS PLACEMENT RIGHT EYE;  Surgeon: Baruch Goldmann, MD;  Location: [facility];  Service: Ophthalmology;  Laterality: Right;  right CDE=12.48  . flex signoidoscopy    . INCISION AND DRAINAGE PERIRECTAL ABSCESS    . JOINT REPLACEMENT Right 2006   knee  . REPLACEMENT TOTAL KNEE Right   . TOTAL KNEE ARTHROPLASTY Left 08/06/2014   Procedure: LEFT TOTAL KNEE ARTHROPLASTY;  Surgeon:  Gaynelle Arabian, MD;  Location: [facility];  Service: Orthopedics;  Laterality: Left;  . TUBAL LIGATION  1979   Family History  Problem Relation Age of Onset  . Alcohol abuse Father   . Cancer Sister        breast  . Heart disease Paternal Grandfather   . Diabetes Other   . Obesity Other   . Heart disease Brother        Valve   Social History   Socioeconomic History  . Marital status: Married    Spouse name: Not on file  . Number of children: 3  . Years of education: Not on file  . Highest education level: Not on file  Occupational History  . Not on file  Tobacco Use  . Smoking status: Never Smoker  . Smokeless tobacco: Never Used  Substance and Sexual Activity  . Alcohol use: No  . Drug use: No  . Sexual activity:  Yes  Other Topics Concern  . Not on file  Social History Narrative         Home Situation: lives with husband and granddaughter      Spiritual Beliefs: Jehovah Witness - no blood products               Social Determinants of Health   Financial Resource Strain: Low Risk   . Difficulty of Paying Living Expenses: Not hard at all  Food Insecurity: No Food Insecurity  . Worried About Charity fundraiser in the Last Year: Never true  . Ran Out of Food in the Last Year: Never true  Transportation Needs: No Transportation Needs  . Lack of Transportation (Medical): No  . Lack of Transportation (Non-Medical): No  Physical Activity: Unknown  . Days of Exercise per Week: 0 days  . Minutes of Exercise per Session: Not on file  Stress: No Stress Concern Present  . Feeling of Stress : Only a little  Social Connections: Moderately Integrated  . Frequency of Communication with Friends and Family: More than three times a week  . Frequency of Social Gatherings with Friends and Family: Three times a week  . Attends Religious Services: More than 4 times per year  . Active Member of Clubs or Organizations: No  . Attends Archivist Meetings: Never  . Marital  Status: Married    Tobacco Counseling Counseling given: Not Answered   Clinical Intake:  Pre-visit preparation completed: Yes  Pain : No/denies pain     BMI - recorded: 37.24 Nutritional Status: BMI > 30  Obese Nutritional Risks: None Diabetes: Yes CBG done?: No Did pt. bring in CBG monitor from home?: No  How often do you need to have someone help you when you read instructions, pamphlets, or other written materials from your doctor or pharmacy?: 1 - Never  Diabetic?Nutrition Risk Assessment:  Has the patient had any N/V/D within the last 2 months?  No  Does the patient have any non-healing wounds?  No  Has the patient had any unintentional weight loss or weight gain?  No   Diabetes:  Is the patient diabetic?  Yes  If diabetic, was a CBG obtained today?  No  Did the patient bring in their glucometer from home?  No  How often do you monitor your CBG's? N/A.   Financial Strains and Diabetes Management:  Are you having any financial strains with the device, your supplies or your medication? No .  Does the patient want to be seen by Chronic Care Management for management of their diabetes?  No  Would the patient like to be referred to a Nutritionist or for Diabetic Management?  No   Diabetic Exams:  Diabetic Eye Exam: Overdue for diabetic eye exam. Pt has been advised about the importance in completing this exam. Patient advised to call and schedule an eye exam. Diabetic Foot Exam: Overdue, Pt has been advised about the importance in completing this exam. Pt is scheduled for diabetic foot exam on next appt .   Interpreter Needed?: No  Information entered by :: Charlott Rakes, LPN   Activities of Daily Living In your present state of health, do you have any difficulty performing the following activities: 10/07/2020 08/21/2020  Hearing? Y Y  Comment wears hearing -  Vision? N N  Difficulty concentrating or making decisions? N N  Walking or climbing stairs? Y Y   Comment use walker -  Dressing or bathing? N Y  Doing  errands, shopping? N N  Preparing Food and eating ? Y -  Comment mostly go out -  Using the Toilet? N -  In the past six months, have you accidently leaked urine? Y -  Comment incontinient -  Do you have problems with loss of bowel control? Y -  Comment at times -  Managing your Medications? N -  Managing your Finances? N -  Housekeeping or managing your Housekeeping? N -  Some recent data might be hidden    Patient Care Team: Allwardt, Randa Evens, PA-C as PCP - General (Physician Assistant) Minus Breeding, MD as PCP - Cardiology (Cardiology) Suella Broad, MD as Consulting Physician (Physical Medicine and Rehabilitation) Minus Breeding, MD as Consulting Physician (Cardiology) Madelin Headings, DO (Optometry) Madelin Rear, [facility] as Pharmacist (Pharmacist)  Indicate any recent Medical Services you may have received from other than [facility] providers in the past year (date may be approximate).     Assessment:   This is a routine wellness examination for [name].  Hearing/Vision screen  Hearing Screening   125Hz  250Hz  500Hz  1000Hz  2000Hz  3000Hz  4000Hz  6000Hz  8000Hz   Right ear:           Left ear:           Comments: Pt wears hearing aids   Vision Screening Comments: Pt follows up with dr Venetia Constable in [place] for annual eye exams   Dietary issues and exercise activities discussed: Current Exercise Habits: The patient does not participate in regular exercise at present  Goals Addressed            This Visit's Progress   . Patient Stated       Staying healthy and surviving       Depression Screen PHQ 2/9 Scores 10/07/2020 06/11/2020 01/02/2020 09/06/2019 04/10/2019 10/18/2018 04/26/2018  PHQ - 2 Score 1 1 0 0 1 0 1  PHQ- 9 Score - 2 0 - 2 2 7     Fall Risk Fall Risk  10/07/2020 07/10/2020 09/06/2019 10/18/2018 09/22/2016  Falls in the past year? 1 0 1 1 Yes  Number falls in past yr: 1 - 1 0 1  Injury with Fall? 1 - 1 0 -  Comment  bruises - - - -  Risk for fall due to : Impaired vision;Impaired balance/gait;Impaired mobility - Impaired balance/gait;Impaired mobility;History of fall(s) - -  Follow up Falls prevention discussed - Falls evaluation completed;Education provided;Falls prevention discussed Falls evaluation completed Education provided    FALL RISK PREVENTION PERTAINING TO THE HOME:  Any stairs in or around the home? No  If so, are there any without handrails? No  Home free of loose throw rugs in walkways, pet beds, electrical cords, etc? Yes  Adequate lighting in your home to reduce risk of falls? Yes   ASSISTIVE DEVICES UTILIZED TO PREVENT FALLS:  Life alert? No  Use of a cane, walker or w/c? Yes  Grab bars in the bathroom? No  Shower chair or bench in shower? No  Elevated toilet seat or a handicapped toilet? No   TIMED UP AND GO:  Was the test performed? No .   Cognitive Function: Declined      6CIT Screen 09/06/2019  What Year? 0 points  What month? 0 points  What time? 0 points  Count back from 20 0 points  Months in reverse 0 points    Immunizations Immunization History  Administered Date(s) Administered  . Fluad Quad(high Dose 65+) 01/24/2019  . Influenza Split 02/20/2011, 01/25/2012  .  Influenza Whole 02/16/1999, 02/25/2007, 03/07/2009, 02/14/2010  . Influenza, High Dose Seasonal PF 02/17/2016, 03/23/2017, 01/20/2018  . Influenza,inj,Quad PF,6+ Mos 02/01/2013, 02/19/2014, 01/30/2015  . Influenza,inj,quad, With Preservative 02/15/2017  . PFIZER Comirnaty(Gray Top)Covid-19 Tri-Sucrose Vaccine 07/01/2020  . PFIZER(Purple Top)SARS-COV-2 Vaccination 06/06/2019, 06/26/2019  . Pneumococcal Conjugate-13 01/30/2015  . Pneumococcal Polysaccharide-23 10/11/2013  . Td 05/18/2005  . Tdap 10/21/2015  . Zoster 02/20/2011    TDAP status: Up to date  Flu Vaccine status: Due, Education has been provided regarding the importance of this vaccine. Advised may receive this vaccine at local  pharmacy or Health Dept. Aware to provide a copy of the vaccination record if obtained from local pharmacy or Health Dept. Verbalized acceptance and understanding.  Pneumococcal vaccine status: Up to date  Covid-19 vaccine status: Completed vaccines  Qualifies for Shingles Vaccine? Yes   Zostavax completed Yes   Shingrix Completed?: No.    Education has been provided regarding the importance of this vaccine. Patient has been advised to call insurance company to determine out of pocket expense if they have not yet received this vaccine. Advised may also receive vaccine at local pharmacy or Health Dept. Verbalized acceptance and understanding.  Screening Tests Health Maintenance  Topic Date Due  . OPHTHALMOLOGY EXAM  12/20/2019  . FOOT EXAM  01/24/2020  . COVID-19 Vaccine (4 - Booster for Pfizer series) 09/28/2020  . HEMOGLOBIN A1C  12/09/2020  . INFLUENZA VACCINE  12/16/2020  . TETANUS/TDAP  10/20/2025  . DEXA SCAN  Completed  . PNA vac Low Risk Adult  Completed  . HPV VACCINES  Aged Out    Health Maintenance  Health Maintenance Due  Topic Date Due  . OPHTHALMOLOGY EXAM  12/20/2019  . FOOT EXAM  01/24/2020  . COVID-19 Vaccine (4 - Booster for Pfizer series) 09/28/2020    Colonoscopy: stated fecal occult blood test annually   Mammogram status: Completed 12/07/19. Repeat every year  Bone Density status: Completed 12/07/19. Results reflect: Bone density results: OSTEOPOROSIS. Repeat every 0 years.  Additional Screening:  Vision Screening: Recommended annual ophthalmology exams for early detection of glaucoma and other disorders of the eye. Is the patient up to date with their annual eye exam?  Yes  Who is the provider or what is the name of the office in which the patient attends annual eye exams? Dr Venetia Constable If pt is not established with a provider, would they like to be referred to a provider to establish care? No .   Dental Screening: Recommended annual dental exams for  proper oral hygiene  Community Resource Referral / Chronic Care Management: CRR required this visit?  Yes   CCM required this visit?  Yes      Plan:     I have personally reviewed and noted the following in the patient's chart:   . Medical and social history . Use of alcohol, tobacco or illicit drugs  . Current medications and supplements including opioid prescriptions.  . Functional ability and status . Nutritional status . Physical activity . Advanced directives . List of other physicians . Hospitalizations, surgeries, and ER visits in previous 12 months . Vitals . Screenings to include cognitive, depression, and falls . Referrals and appointments  In addition, I have reviewed and discussed with patient certain preventive protocols, quality metrics, and best practice recommendations. A written personalized care plan for preventive services as well as general preventive health recommendations were provided to patient.     Willette Brace, LPN   09/25/2583   Nurse  Notes: None

## 2020-10-09 ENCOUNTER — Telehealth: Payer: Self-pay | Admitting: *Deleted

## 2020-10-09 NOTE — Chronic Care Management (AMB) (Signed)
  Chronic Care Management   Note  10/09/2020 Name: [name] MRN: [id number] DOB: [birth year]  [name] is a 80 y.o. year old female who is a primary care patient of [name], [name], [name]. I reached out to [name] by phone today in response to a referral sent by [name]'s PCP, [name], Vanessa Evens, [name].     [name] was given information about Chronic Care Management services today including:  1. CCM service includes personalized support from designated clinical staff supervised by her physician, including individualized plan of care and coordination with other care providers 2. 24/7 contact phone numbers for assistance for urgent and routine care needs. 3. Service will only be billed when office clinical staff spend 20 minutes or more in a month to coordinate care. 4. Only one practitioner may furnish and bill the service in a calendar month. 5. The patient may stop CCM services at any time (effective at the end of the month) by phone call to the office staff. 6. The patient will be responsible for cost sharing (co-pay) of up to 20% of the service fee (after annual deductible is met).  Patient agreed to services and verbal consent obtained.   Follow up plan: Telephone appointment with care management team member scheduled for:  [facility] 10/10/2020 and PharmD fu on 10/31/2020  [place] [facility]

## 2020-10-10 ENCOUNTER — Ambulatory Visit (INDEPENDENT_AMBULATORY_CARE_PROVIDER_SITE_OTHER): Payer: Medicare HMO | Admitting: *Deleted

## 2020-10-10 DIAGNOSIS — Z794 Long term (current) use of insulin: Secondary | ICD-10-CM

## 2020-10-10 DIAGNOSIS — E1140 Type 2 diabetes mellitus with diabetic neuropathy, unspecified: Secondary | ICD-10-CM | POA: Diagnosis not present

## 2020-10-10 DIAGNOSIS — I10 Essential (primary) hypertension: Secondary | ICD-10-CM | POA: Diagnosis not present

## 2020-10-10 DIAGNOSIS — I5032 Chronic diastolic (congestive) heart failure: Secondary | ICD-10-CM

## 2020-10-10 NOTE — Chronic Care Management (AMB) (Addendum)
Chronic Care Management   CCM RN Visit Note  10/10/2020 Name: [name] MRN: [id number] DOB: April 20, 1941  Subjective: [name] is a 80 y.o. year old female who is a primary care patient of Allwardt, Alyssa M, PA-C. The care management team was consulted for assistance with disease management and care coordination needs.    Engaged with patient by telephone for initial visit in response to provider referral for case management and/or care coordination services.   Consent to Services:  The patient was given the following information about Chronic Care Management services today, agreed to services, and gave verbal consent: 1. CCM service includes personalized support from designated clinical staff supervised by the primary care provider, including individualized plan of care and coordination with other care providers 2. 24/7 contact phone numbers for assistance for urgent and routine care needs. 3. Service will only be billed when office clinical staff spend 20 minutes or more in a month to coordinate care. 4. Only one practitioner may furnish and bill the service in a calendar month. 5.The patient may stop CCM services at any time (effective at the end of the month) by phone call to the office staff. 6. The patient will be responsible for cost sharing (co-pay) of up to 20% of the service fee (after annual deductible is met). Patient agreed to services and consent obtained.  Patient agreed to services and verbal consent obtained.   Assessment: Review of patient past medical history, allergies, medications, health status, including review of consultants reports, laboratory and other test data, was performed as part of comprehensive evaluation and provision of chronic care management services.   SDOH (Social Determinants of Health) assessments and interventions performed:  SDOH Interventions   Flowsheet Row Most Recent Value  SDOH Interventions   Food Insecurity Interventions Intervention  Not Indicated  Financial Strain Interventions Intervention Not Indicated  Housing Interventions Intervention Not Indicated  Intimate Partner Violence Interventions Intervention Not Indicated  Transportation Interventions Intervention Not Indicated       CCM Care Plan  Allergies  Allergen Reactions  . Sulfa Antibiotics Swelling     Facial Swelling "swelling lips"   . Sulfonamide Derivatives Swelling    Lips Swelling  . Other     BLOOD PRODUCT REFUSAL    Outpatient Encounter Medications as of 10/10/2020  Medication Sig  . apixaban (ELIQUIS) 5 MG TABS tablet Take 1 tablet (5 mg total) by mouth 2 (two) times daily.  . Ascorbic Acid (VITAMIN C PO) Take 2 tablets by mouth daily.  . Calcium Citrate-Vitamin D (CALCIUM + D PO) Take 2 tablets by mouth daily.  . carvedilol (COREG) 3.125 MG tablet Take 1 tablet (3.125 mg total) by mouth 2 (two) times daily with a meal.  . Cholecalciferol (VITAMIN D3) 50 MCG (2000 UT) CHEW Chew 4,000 Units by mouth daily.  . Cyanocobalamin (B-12 PO) Take 2 tablets by mouth daily.  . furosemide (LASIX) 80 MG tablet Take 1 tablet (80 mg total) by mouth 3 (three) times daily.  Marland Kitchen HYDROcodone-acetaminophen (NORCO/VICODIN) 5-325 MG tablet Take 1 tablet by mouth 2 (two) times daily as needed for moderate pain.  Marland Kitchen LANTUS SOLOSTAR 100 UNIT/ML Solostar Pen INJECT 25 UNITS INTO THE SKIN AT BEDTIME.  Marland Kitchen lisinopril (ZESTRIL) 20 MG tablet TAKE 1 TABLET (20 MG TOTAL) BY MOUTH DAILY.  . metFORMIN (GLUCOPHAGE) 1000 MG tablet TAKE 1 TABLET TWICE DAILY (Patient taking differently: Take 1,000 mg by mouth in the morning and at bedtime.)  . potassium chloride (KLOR-CON)  10 MEQ tablet TAKE 2 TABLETS (20 MEQ TOTAL) BY MOUTH 3 TIMES DAILY. TAKE WITH LASIX  . pramipexole (MIRAPEX) 1 MG tablet Take 1 tablet (1 mg total) by mouth 2 (two) times daily.  Marland Kitchen triamcinolone (KENALOG) 0.1 % Apply to red areas on legs twice a day as needed (Patient taking differently: Apply 1 application topically  2 (two) times daily as needed (redness). Apply to red areas on legs)  . Insulin Pen Needle (DROPLET PEN NEEDLES) 32G X 4 MM MISC USE AS DIRECTED ONE TIME DAILY   No facility-administered encounter medications on file as of 10/10/2020.    Patient Active Problem List   Diagnosis Date Noted  . Persistent atrial fibrillation ([place]) 09/26/2020  . Educated about COVID-19 virus infection 11/30/2019  . Chronic respiratory failure with hypoxia and hypercapnia ([place]) 07/11/2019  . Acute on chronic diastolic CHF (congestive heart failure) ([place]) 07/08/2019  . Hypoxia 07/07/2019  . Hypokalemia 05/03/2019  . Facial asymmetry, acquired 08/20/2016  . Fracture of knee prosthesis ([place]) 08/01/2016  . Obesity 02/20/2016  . Chronic pain syndrome 02/19/2016  . Mild episode of recurrent major depressive disorder ([place]) 08/02/2015  . Chronic diastolic congestive heart failure ([place]) 07/16/2015  . Type 2 diabetes mellitus with diabetic neuropathy ([place]) 09/27/2014  . Hearing loss - has hearing aides, follow by audiologist 10/11/2013  . No blood products ([place])  09/11/2013  . Disorder of bone and cartilage 06/10/2010  . Breast cancer of upper-outer quadrant of right female breast ([place]) 04/04/2010  . RESTLESS LEGS SYNDROME 05/05/2007  . Obstructive sleep apnea 03/01/2007  . Essential hypertension 01/27/2007  . Osteoarthritis 01/27/2007    Conditions to be addressed/monitored:CHF and DMII  Care Plan : Heart Failure (Adult)  Updates made by Leona Singleton, RN since 10/10/2020 12:00 AM  Problem: Symptom Exacerbation (Heart Failure)   Priority: Medium  Long-Range Goal: Symptom Exacerbation Prevented or Minimized   Start Date: 10/10/2020  Expected End Date: 04/16/2021  Priority: Medium  Current Barriers:  Marland Kitchen Knowledge deficit related to basic heart failure pathophysiology and self care management as evidenced by patient not able to verbalize when she needs to contact provider.  Does report weighing  daily with ranges of 188-193 pounds.  Weight this morning was 191 pounds.  Denies any shortness of breath.  Does endorse some lower extremity edema that is the same as always.  Reports compliance with nightly BIPAP and oxygen. Case Manager Clinical Goal(s):  . patient will weigh self daily and record . patient will verbalize understanding of Heart Failure Action Plan and when to call doctor . patient will take all Heart Failure mediations as prescribed . patient will weigh daily and record (notifying MD of 3 lb weight gain over night or 5 lb in a week) Interventions:  . Collaboration with Allwardt, Randa Evens, PA-C regarding development and update of comprehensive plan of care as evidenced by provider attestation and co-signature . Inter-disciplinary care team collaboration (see longitudinal plan of care) . Basic overview and discussion of pathophysiology of Heart Failure reviewed and encouraged to follow rescue plan if symptoms flare-up . Provided verbal education on low sodium diet . Reviewed Heart Failure Action Plan in depth and provided written copy . Assessed need for readable accurate scales in home . Discussed importance of daily weight and advised patient to weigh and record daily . Reviewed role of diuretics in prevention of fluid overload and management of heart failure . Barriers to lifestyle changes reviewed and addressed . Barriers to treatment  reviewed and addressed . Depression screen reviewed . Healthy lifestyle promoted . Self-awareness of signs/symptoms of worsening disease encouraged . Attends all scheduled provider appointments . Encouraged to eat more whole grains, fruits and vegetables, lean meats and healthy fats . Discussed know when to call the doctor . Assessed understanding of adherence and barriers to treatment plan, as well as lifestyle changes; develop strategies to address barriers . Encouraged to discuss pill packs with CCM Pharmacist Patient Goals/Self-Care  Activities: Call office if I gain more than 2 pounds in one day or 5 pounds in one week Keep legs up while sitting Track weight in diary Use salt in moderation Watch for swelling in feet, ankles and legs every day Weigh myself daily  Follow Up Plan: The care management team will reach out to the patient again over the next 30 business days.      Care Plan : Diabetes Type 2 (Adult)  Updates made by Leona Singleton, RN since 10/10/2020 12:00 AM  Problem: Glycemic Management (Diabetes, Type 2)   Priority: Medium  Long-Range Goal: Glycemic Management Optimized   Start Date: 10/10/2020  Expected End Date: 04/16/2021  Priority: Medium  Objective:  Lab Results  Component Value Date   HGBA1C 6.9 (H) 06/11/2020  Current Barriers:  Marland Kitchen Knowledge Deficits related to basic Diabetes pathophysiology and self care/management Reports she does not monitor her blood sugars at home.  Latest Hgb A1C 6.9.  States she tries to limit carbs in her diet.  Currently husband transports to medical appointments.  Patient concerned husbands medical condition is deteriorating and not sure how long he will be able to drive.  She is also interested in having someone assist with house cleaning. Case Manager Clinical Goal(s):  . patient will demonstrate improved adherence to prescribed treatment plan for diabetes self care/management as evidenced by: adherence to ADA/ carb modified diet adherence to prescribed medication regimen Interventions:  . Collaboration with Allwardt, Randa Evens, PA-C regarding development and update of comprehensive plan of care as evidenced by provider attestation and co-signature . Inter-disciplinary care team collaboration (see longitudinal plan of care) . Provided education to patient about basic DM disease process . Reviewed medications with patient and discussed importance of medication adherence . Discussed plans with patient for ongoing care management follow up and provided patient with  direct contact information for care management team . Provided patient with written educational materials related to hypo and hyperglycemia and importance of correct treatment . Barriers to adherence to treatment plan identified . Blood glucose monitoring encouraged . Self-awareness of signs/symptoms of hypo or hyperglycemia encouraged . Encouraged to attend all scheduled provider appointments . Reviewed mutually-set A1C goal . Sending Living Well with Diabetes Educational Packet . Provided patient with [facility] number [phone]) . Provided patient with list personal care services home health agencies . Verified patient did not have Medicaid and discussed with patient possibility of paying out of pocket for personal care services Patient Goals/Self-Care Activities: . Set target A1C 7 (current 6.9) . Review Diabetes education mailed to you . Review list of personal care services mailed Follow Up Plan: The care management team will reach out to the patient again over the next 30 business days.        Plan:The care management team will reach out to the patient again over the next 30 business days.  Hubert Azure RN, MSN RN Care Management Coordinator  [place] [phone] [email or web address]

## 2020-10-10 NOTE — Patient Instructions (Addendum)
Visit Information   PATIENT GOALS:  Goals Addressed            This Visit's Progress   . (RNCM) Set My Target A1C-Diabetes Type 2       Timeframe:  Long-Range Goal Priority:  Medium Start Date:   10/10/20                          Expected End Date:  04/16/21                     Follow Up Date 10/29/20    . Set target A1C 7 (current 6.9) . Review Diabetes education mailed to you . Review list of personal care services mailed   Why is this important?    Your target A1C is decided together by you and your doctor.   It is based on several things like your age and other health issues.    Notes:     Marland Kitchen (RNCM) Track and Manage Fluids and Swelling-Heart Failure       Timeframe:  Long-Range Goal Priority:  Medium Start Date:   10/10/20                          Expected End Date: 04/16/21                    Follow Up Date 10/29/20    Call office if I gain more than 2 pounds in one day or 5 pounds in one week Keep legs up while sitting Track weight in diary Use salt in moderation Watch for swelling in feet, ankles and legs every day Weigh myself daily    Why is this important?    It is important to check your weight daily and watch how much salt and liquids you have.   It will help you to manage your heart failure.    Notes:        Consent to CCM Services: Ms. Panos was given information about Chronic Care Management services today including:  1. CCM service includes personalized support from designated clinical staff supervised by her physician, including individualized plan of care and coordination with other care providers 2. 24/7 contact phone numbers for assistance for urgent and routine care needs. 3. Service will only be billed when office clinical staff spend 20 minutes or more in a month to coordinate care. 4. Only one practitioner may furnish and bill the service in a calendar month. 5. The patient may stop CCM services at any time (effective at the end of the  month) by phone call to the office staff. 6. The patient will be responsible for cost sharing (co-pay) of up to 20% of the service fee (after annual deductible is met).  Patient agreed to services and verbal consent obtained.   Patient verbalizes understanding of instructions provided today and agrees to view in [place].   The care management team will reach out to the patient again over the next 30 business days.   Hubert Azure RN, MSN RN Care Management Coordinator  [place] [phone] [email or web address]   CLINICAL CARE PLAN: Patient Care Plan: Heart Failure (Adult)  Problem Identified: Symptom Exacerbation (Heart Failure)   Priority: Medium  Long-Range Goal: Symptom Exacerbation Prevented or Minimized   Start Date: 10/10/2020  Expected End Date: 04/16/2021  Priority: Medium  Current Barriers:  Marland Kitchen Knowledge deficit related to basic heart  failure pathophysiology and self care management as evidenced by patient not able to verbalize when she needs to contact provider.  Does report weighing daily with ranges of 188-193 pounds.  Weight this morning was 191 pounds.  Denies any shortness of breath.  Does endorse some lower extremity edema that is the same as always.  Reports compliance with nightly BIPAP and oxygen. Case Manager Clinical Goal(s):  . patient will weigh self daily and record . patient will verbalize understanding of Heart Failure Action Plan and when to call doctor . patient will take all Heart Failure mediations as prescribed . patient will weigh daily and record (notifying MD of 3 lb weight gain over night or 5 lb in a week) Interventions:  . Collaboration with Allwardt, Randa Evens, PA-C regarding development and update of comprehensive plan of care as evidenced by provider attestation and co-signature . Inter-disciplinary care team collaboration (see longitudinal plan of care) . Basic overview and discussion of pathophysiology of  Heart Failure reviewed and encouraged to follow rescue plan if symptoms flare-up . Provided verbal education on low sodium diet . Reviewed Heart Failure Action Plan in depth and provided written copy . Assessed need for readable accurate scales in home . Discussed importance of daily weight and advised patient to weigh and record daily . Reviewed role of diuretics in prevention of fluid overload and management of heart failure . Barriers to lifestyle changes reviewed and addressed . Barriers to treatment reviewed and addressed . Depression screen reviewed . Healthy lifestyle promoted . Self-awareness of signs/symptoms of worsening disease encouraged . Attends all scheduled provider appointments . Encouraged to eat more whole grains, fruits and vegetables, lean meats and healthy fats . Discussed know when to call the doctor . Assessed understanding of adherence and barriers to treatment plan, as well as lifestyle changes; develop strategies to address barriers . Encouraged to discuss pill packs with CCM Pharmacist Patient Goals/Self-Care Activities: Call office if I gain more than 2 pounds in one day or 5 pounds in one week Keep legs up while sitting Track weight in diary Use salt in moderation Watch for swelling in feet, ankles and legs every day Weigh myself daily  Follow Up Plan: The care management team will reach out to the patient again over the next 30 business days.     Patient Care Plan: Diabetes Type 2 (Adult)  Problem Identified: Glycemic Management (Diabetes, Type 2)   Priority: Medium  Long-Range Goal: Glycemic Management Optimized   Start Date: 10/10/2020  Expected End Date: 04/16/2021  Priority: Medium  Objective:  Lab Results  Component Value Date   HGBA1C 6.9 (H) 06/11/2020  Current Barriers:  Marland Kitchen Knowledge Deficits related to basic Diabetes pathophysiology and self care/management Reports she does not monitor her blood sugars at home.  Latest Hgb A1C 6.9.  States  she tries to limit carbs in her diet.  Currently husband transports to medical appointments.  Patient concerned husbands medical condition is deteriorating and not sure how long he will be able to drive.  She is also interested in having someone assist with house cleaning. Case Manager Clinical Goal(s):  . patient will demonstrate improved adherence to prescribed treatment plan for diabetes self care/management as evidenced by: adherence to ADA/ carb modified diet adherence to prescribed medication regimen Interventions:  . Collaboration with Allwardt, Randa Evens, PA-C regarding development and update of comprehensive plan of care as evidenced by provider attestation and co-signature . Inter-disciplinary care team collaboration (see longitudinal plan of care) .  Provided education to patient about basic DM disease process . Reviewed medications with patient and discussed importance of medication adherence . Discussed plans with patient for ongoing care management follow up and provided patient with direct contact information for care management team . Provided patient with written educational materials related to hypo and hyperglycemia and importance of correct treatment . Barriers to adherence to treatment plan identified . Blood glucose monitoring encouraged . Self-awareness of signs/symptoms of hypo or hyperglycemia encouraged . Encouraged to attend all scheduled provider appointments . Reviewed mutually-set A1C goal . Sending Living Well with Diabetes Educational Packet . Provided patient with [facility] number [phone]) . Provided patient with list personal care services home health agencies . Verified patient did not have Medicaid and discussed with patient possibility of paying out of pocket for personal care services Patient Goals/Self-Care Activities: . Set target A1C 7 (current 6.9) . Review Diabetes education mailed to you . Review list of personal care services  mailed Follow Up Plan: The care management team will reach out to the patient again over the next 30 business days.           Heart Failure Action Plan A heart failure action plan helps you understand what to do when you have symptoms of heart failure. Your action plan is a color-coded plan that lists the symptoms to watch for and indicates what actions to take.  If you have symptoms in the red zone, you need medical care right away.  If you have symptoms in the yellow zone, you are having problems.  If you have symptoms in the green zone, you are doing well. Follow the plan that was created by you and your health care provider. Review your plan each time you visit your health care provider. Red zone These signs and symptoms mean you should get medical help right away:  You have trouble breathing when resting.  You have a dry cough that is getting worse.  You have swelling or pain in your legs or abdomen that is getting worse.  You suddenly gain more than 2-3 lb (0.9-1.4 kg) in 24 hours, or more than 5 lb (2.3 kg) in a week. This amount may be more or less depending on your condition.  You have trouble staying awake or you feel confused.  You have chest pain.  You do not have an appetite.  You pass out.  You have worsening sadness or depression. If you have any of these symptoms, call your local emergency services (911 in the [place] right away. Do not drive yourself to the hospital.   Yellow zone These signs and symptoms mean your condition may be getting worse and you should make some changes:  You have trouble breathing when you are active, or you need to sleep with your head raised on extra pillows to help you breathe.  You have swelling in your legs or abdomen.  You gain 2-3 lb (0.9-1.4 kg) in 24 hours, or 5 lb (2.3 kg) in a week. This amount may be more or less depending on your condition.  You get tired easily.  You have trouble sleeping.  You have a dry  cough. If you have any of these symptoms:  Contact your health care provider within the next day.  Your health care provider may adjust your medicines.   Green zone These signs mean you are doing well and can continue what you are doing:  You do not have shortness of breath.  You have very  little swelling or no new swelling.  Your weight is stable (no gain or loss).  You have a normal activity level.  You do not have chest pain or any other new symptoms.   Follow these instructions at home:  Take over-the-counter and prescription medicines only as told by your health care provider.  Weigh yourself daily. Your target weight is __________ lb (__________ kg). ? Call your health care provider if you gain more than __________ lb (__________ kg) in 24 hours, or more than __________ lb (__________ kg) in a week. ? Health care provider name: _____________________________________________________ ? Health care provider phone number: _____________________________________________________  Eat a heart-healthy diet. Work with a diet and nutrition specialist (dietitian) to create an eating plan that is best for you.  Keep all follow-up visits. This is important. Where to find more information  American Heart Association: www.heart.org Summary  A heart failure action plan helps you understand what to do when you have symptoms of heart failure.  Follow the action plan that was created by you and your health care provider.  Get help right away if you have any symptoms in the red zone. This information is not intended to replace advice given to you by your health care provider. Make sure you discuss any questions you have with your health care provider. Document Revised: 12/18/2019 Document Reviewed: 12/18/2019 Elsevier Patient Education  2021 [employer].

## 2020-10-11 ENCOUNTER — Telehealth: Payer: Self-pay | Admitting: Physician Assistant

## 2020-10-11 NOTE — Telephone Encounter (Signed)
   Telephone encounter was:  Unsuccessful.  10/11/2020 Name: [name] MRN: [id number] DOB: Apr 14, 1941  Unsuccessful outbound call made today to assist with:  Transportation Needs   Outreach Attempt:  1st Attempt  A HIPAA compliant voice message was left requesting a return call.  Instructed patient to call back at [phone].  [place], Care Management Phone: [phone] Email: [email or web address]

## 2020-10-11 NOTE — Telephone Encounter (Signed)
   Telephone encounter was:  Successful.  10/11/2020 Name: [name] MRN: [id number] DOB: May 25, 1940  [name] is a 80 y.o. year old female who is a primary care patient of Allwardt, Alyssa M, PA-C . The community resource team was consulted for assistance with Transportation Needs  and finding a housekeeper.   Care guide performed the following interventions: Patient provided with information about care guide support team and interviewed to confirm resource needs Discussed resources to assist with the transportation options that are available to her and her spouse. We discussed helping her find a housekeeper, because the cost of getting an in home care attendant is really expensive and can be more cost effective with just a housekeeper. Pt was a little hard of hearing and delayed with her thoughts, her daughter [name] will be in town from June 9-17th and I feel that it would be better to have her help in interviewing houskeepers so that pt doesn't get taken advantage of. .  Follow Up Plan:  Care guide will follow up with patient by phone over the next two weeks  [place], Care Management Phone: [phone] Email: [email or web address]

## 2020-10-16 ENCOUNTER — Encounter: Payer: Self-pay | Admitting: Family Medicine

## 2020-10-16 ENCOUNTER — Ambulatory Visit (INDEPENDENT_AMBULATORY_CARE_PROVIDER_SITE_OTHER): Payer: Medicare HMO | Admitting: Family Medicine

## 2020-10-16 ENCOUNTER — Other Ambulatory Visit: Payer: Self-pay

## 2020-10-16 VITALS — BP 144/80 | HR 108 | Temp 97.7°F | Wt 194.0 lb

## 2020-10-16 DIAGNOSIS — R300 Dysuria: Secondary | ICD-10-CM | POA: Diagnosis not present

## 2020-10-16 LAB — POCT URINALYSIS DIPSTICK
Bilirubin, UA: NEGATIVE
Blood, UA: POSITIVE
Glucose, UA: NEGATIVE
Ketones, UA: NEGATIVE
Nitrite, UA: POSITIVE
Protein, UA: POSITIVE — AB
Spec Grav, UA: 1.015 (ref 1.010–1.025)
Urobilinogen, UA: 0.2 E.U./dL
pH, UA: 6 (ref 5.0–8.0)

## 2020-10-16 MED ORDER — CEPHALEXIN 500 MG PO CAPS: 500.0000 mg | ORAL_CAPSULE | Freq: Three times a day (TID) | ORAL | 0 refills | Status: DC

## 2020-10-16 NOTE — Progress Notes (Signed)
Established Patient Office Visit  Subjective:  Patient ID: [name], female    DOB: 06/13/1940  Age: 80 y.o. MRN: [id number]  CC:  Chief Complaint  Patient presents with  . Urinary Tract Infection    Dysuria, x 1 day    HPI [name] presents for work in visit for 1 day history of burning with urination and some mild frequency.  No gross hematuria.  No flank pain.  No fevers or chills.  No nausea or vomiting.  She states she has not had a urine infection to her knowledge in several years.  She has multiple chronic problems including history of diastolic heart failure, hypertension, atrial fibrillation, chronic anticoagulation, type 2 diabetes, obstructive sleep apnea, history of breast cancer Does appear in reviewing records that she had Pseudomonas UTI back in March 2021.  She has reported allergy to sulfa.  Past Medical History:  Diagnosis Date  . AK (actinic keratosis)   . Anxiety    occasional  . Breast cancer ([place])    R breast, s/p radiation 34Gy/74f 04/29/10-05/05/10  . CTS (carpal tunnel syndrome)    Left  . Diabetes ([place]   . Diastolic CHF ([facility]    Dx w/ hospitalization 06/2015 for respiratory failure  . Facial asymmetry, acquired 08/20/2016   From injury  . Falls frequently    "can not pick left leg up"  . Hearing loss    bilateral hearing aides  . History of environmental allergies   . History of kidney stones 20 years ago  . Hypertension   . MDD (major depressive disorder)   . No blood products ([employer])  09/11/2013  . Osteoarthritis    hx shoulder, knee, back, wrist pain; saw Dr. AWynelle Link  . Personal history of radiation therapy   . RESTLESS LEGS SYNDROME 05/05/2007   Qualifier: Diagnosis of  By: CGwenette GreetMD, KArmando Reichert  . RLS (restless legs syndrome)   . Sleep apnea     Past Surgical History:  Procedure Laterality Date  . APPENDECTOMY  age 80 . BREAST LUMPECTOMY Right 2012  . BREAST SURGERY  2012   rt breast lumpectomy  . CATARACT  EXTRACTION W/PHACO Left 08/12/2020   Procedure: CATARACT EXTRACTION PHACO AND INTRAOCULAR LENS PLACEMENT LEFT EYE;  Surgeon: WBaruch Goldmann MD;  Location: [facility];  Service: Ophthalmology;  Laterality: Left;  left CDE:13.68  . CATARACT EXTRACTION W/PHACO Right 08/26/2020   Procedure: CATARACT EXTRACTION PHACO AND INTRAOCULAR LENS PLACEMENT RIGHT EYE;  Surgeon: WBaruch Goldmann MD;  Location: [facility];  Service: Ophthalmology;  Laterality: Right;  right CDE=12.48  . flex signoidoscopy    . INCISION AND DRAINAGE PERIRECTAL ABSCESS    . JOINT REPLACEMENT Right 2006   knee  . REPLACEMENT TOTAL KNEE Right   . TOTAL KNEE ARTHROPLASTY Left 08/06/2014   Procedure: LEFT TOTAL KNEE ARTHROPLASTY;  Surgeon: FGaynelle Arabian MD;  Location: [facility];  Service: Orthopedics;  Laterality: Left;  . TUBAL LIGATION  1979    Family History  Problem Relation Age of Onset  . Alcohol abuse Father   . Cancer Sister        breast  . Heart disease Paternal Grandfather   . Diabetes Other   . Obesity Other   . Heart disease Brother        Valve    Social History   Socioeconomic History  . Marital status: Married    Spouse name: Not on file  . Number of children: 3  .  Years of education: Not on file  . Highest education level: Not on file  Occupational History  . Not on file  Tobacco Use  . Smoking status: Never Smoker  . Smokeless tobacco: Never Used  Vaping Use  . Vaping Use: Never used  Substance and Sexual Activity  . Alcohol use: No  . Drug use: No  . Sexual activity: Yes  Other Topics Concern  . Not on file  Social History Narrative         Home Situation: lives with husband and granddaughter      Spiritual Beliefs: Je[employer] Witness - no blood products               Social Determinants of Health   Financial Resource Strain: Low Risk   . Difficulty of Paying Living Expenses: Not hard at all  Food Insecurity: No Food Insecurity  . Worried About Charity fundraiser in the Last Year: Never  true  . Ran Out of Food in the Last Year: Never true  Transportation Needs: No Transportation Needs  . Lack of Transportation (Medical): No  . Lack of Transportation (Non-Medical): No  Physical Activity: Unknown  . Days of Exercise per Week: 0 days  . Minutes of Exercise per Session: Not on file  Stress: No Stress Concern Present  . Feeling of Stress : Only a little  Social Connections: Moderately Integrated  . Frequency of Communication with Friends and Family: More than three times a week  . Frequency of Social Gatherings with Friends and Family: Three times a week  . Attends Religious Services: More than 4 times per year  . Active Member of Clubs or Organizations: No  . Attends Archivist Meetings: Never  . Marital Status: Married  Human resources officer Violence: Not At Risk  . Fear of Current or Ex-Partner: No  . Emotionally Abused: No  . Physically Abused: No  . Sexually Abused: No    Outpatient Medications Prior to Visit  Medication Sig Dispense Refill  . apixaban (ELIQUIS) 5 MG TABS tablet Take 1 tablet (5 mg total) by mouth 2 (two) times daily. 60 tablet 11  . Ascorbic Acid (VITAMIN C PO) Take 2 tablets by mouth daily.    . Calcium Citrate-Vitamin D (CALCIUM + D PO) Take 2 tablets by mouth daily.    . carvedilol (COREG) 3.125 MG tablet Take 1 tablet (3.125 mg total) by mouth 2 (two) times daily with a meal. 180 tablet 1  . Cholecalciferol (VITAMIN D3) 50 MCG (2000 UT) CHEW Chew 4,000 Units by mouth daily.    . Cyanocobalamin (B-12 PO) Take 2 tablets by mouth daily.    . furosemide (LASIX) 80 MG tablet Take 1 tablet (80 mg total) by mouth 3 (three) times daily. 270 tablet 1  . HYDROcodone-acetaminophen (NORCO/VICODIN) 5-325 MG tablet Take 1 tablet by mouth 2 (two) times daily as needed for moderate pain.    . Insulin Pen Needle (DROPLET PEN NEEDLES) 32G X 4 MM MISC USE AS DIRECTED ONE TIME DAILY 100 each 6  . LANTUS SOLOSTAR 100 UNIT/ML Solostar Pen INJECT 25 UNITS INTO  THE SKIN AT BEDTIME. 30 mL 2  . lisinopril (ZESTRIL) 20 MG tablet TAKE 1 TABLET (20 MG TOTAL) BY MOUTH DAILY. 90 tablet 2  . metFORMIN (GLUCOPHAGE) 1000 MG tablet TAKE 1 TABLET TWICE DAILY (Patient taking differently: Take 1,000 mg by mouth in the morning and at bedtime.) 180 tablet 1  . potassium chloride (KLOR-CON) 10 MEQ tablet TAKE  2 TABLETS (20 MEQ TOTAL) BY MOUTH 3 TIMES DAILY. TAKE WITH LASIX 180 tablet 1  . pramipexole (MIRAPEX) 1 MG tablet Take 1 tablet (1 mg total) by mouth 2 (two) times daily. 180 tablet 1  . triamcinolone (KENALOG) 0.1 % Apply to red areas on legs twice a day as needed (Patient taking differently: Apply 1 application topically 2 (two) times daily as needed (redness). Apply to red areas on legs) 45 g 0   No facility-administered medications prior to visit.    Allergies  Allergen Reactions  . Sulfa Antibiotics Swelling     Facial Swelling "swelling lips"   . Sulfonamide Derivatives Swelling    Lips Swelling  . Other     BLOOD PRODUCT REFUSAL    ROS Review of Systems  Constitutional: Negative for chills and fever.  Gastrointestinal: Negative for abdominal pain, nausea and vomiting.  Genitourinary: Negative for flank pain and hematuria.      Objective:    Physical Exam Vitals reviewed.  Constitutional:      Appearance: Normal appearance.  Cardiovascular:     Rate and Rhythm: Normal rate.  Pulmonary:     Effort: Pulmonary effort is normal.     Breath sounds: Normal breath sounds.  Neurological:     Mental Status: She is alert.     BP (!) 144/80 (BP Location: Left Arm, Patient Position: Sitting, Cuff Size: Normal)   Pulse (!) 108   Temp 97.7 F (36.5 C) (Oral)   Wt 194 lb (88 kg)   SpO2 97%   BMI 36.66 kg/m  Wt Readings from Last 3 Encounters:  10/16/20 194 lb (88 kg)  09/27/20 197 lb (89.4 kg)  08/21/20 196 lb 8 oz (89.1 kg)     Health Maintenance Due  Topic Date Due  . Zoster Vaccines- Shingrix (1 of 2) Never done  .  OPHTHALMOLOGY EXAM  12/20/2019  . FOOT EXAM  01/24/2020  . COVID-19 Vaccine (4 - Booster for Pfizer series) 09/28/2020    There are no preventive care reminders to display for this patient.  Lab Results  Component Value Date   TSH 0.902 07/08/2019   Lab Results  Component Value Date   WBC 8.8 09/27/2020   HGB 14.0 09/27/2020   HCT 41.4 09/27/2020   MCV 95 09/27/2020   PLT 240 09/27/2020   Lab Results  Component Value Date   NA 143 09/27/2020   K 3.9 09/27/2020   CHLORIDE 101 01/25/2014   CO2 26 09/27/2020   GLUCOSE 93 09/27/2020   BUN 19 09/27/2020   CREATININE 0.91 09/27/2020   BILITOT 0.6 06/11/2020   ALKPHOS 102 06/11/2020   AST 18 06/11/2020   ALT 13 06/11/2020   PROT 7.0 06/11/2020   ALBUMIN 3.7 06/11/2020   CALCIUM 9.0 09/27/2020   ANIONGAP 8 07/10/2019   EGFR 63 09/27/2020   GFR 66.14 07/31/2020   Lab Results  Component Value Date   CHOL 101 06/11/2020   Lab Results  Component Value Date   HDL 31.40 (L) 06/11/2020   Lab Results  Component Value Date   LDLCALC 51 06/11/2020   Lab Results  Component Value Date   TRIG 95.0 06/11/2020   Lab Results  Component Value Date   CHOLHDL 3 06/11/2020   Lab Results  Component Value Date   HGBA1C 6.9 (H) 06/11/2020      Assessment & Plan:   Dysuria of 1 day duration.  Urine dipstick suggest likely UTI.  Patient nontoxic in appearance.  -  Urine culture sent -Stay well-hydrated -Start Keflex 500 mg 3 times daily for 7 days -Follow-up promptly for any persistent or worsening symptoms  No orders of the defined types were placed in this encounter.   Follow-up: No follow-ups on file.    Carolann Littler, MD

## 2020-10-16 NOTE — Patient Instructions (Signed)
Urinary Tract Infection, Adult  A urinary tract infection (UTI) is an infection of any part of the urinary tract. The urinary tract includes the kidneys, ureters, bladder, and urethra. These organs make, store, and get rid of urine in the body. An upper UTI affects the ureters and kidneys. A lower UTI affects the bladder and urethra. What are the causes? Most urinary tract infections are caused by bacteria in your genital area around your urethra, where urine leaves your body. These bacteria grow and cause inflammation of your urinary tract. What increases the risk? You are more likely to develop this condition if:  You have a urinary catheter that stays in place.  You are not able to control when you urinate or have a bowel movement (incontinence).  You are female and you: ? Use a spermicide or diaphragm for birth control. ? Have low estrogen levels. ? Are pregnant.  You have certain genes that increase your risk.  You are sexually active.  You take antibiotic medicines.  You have a condition that causes your flow of urine to slow down, such as: ? An enlarged prostate, if you are female. ? Blockage in your urethra. ? A kidney stone. ? A nerve condition that affects your bladder control (neurogenic bladder). ? Not getting enough to drink, or not urinating often.  You have certain medical conditions, such as: ? Diabetes. ? A weak disease-fighting system (immunesystem). ? Sickle cell disease. ? Gout. ? Spinal cord injury. What are the signs or symptoms? Symptoms of this condition include:  Needing to urinate right away (urgency).  Frequent urination. This may include small amounts of urine each time you urinate.  Pain or burning with urination.  Blood in the urine.  Urine that smells bad or unusual.  Trouble urinating.  Cloudy urine.  Vaginal discharge, if you are female.  Pain in the abdomen or the lower back. You may also have:  Vomiting or a decreased  appetite.  Confusion.  Irritability or tiredness.  A fever or chills.  Diarrhea. The first symptom in older adults may be confusion. In some cases, they may not have any symptoms until the infection has worsened. How is this diagnosed? This condition is diagnosed based on your medical history and a physical exam. You may also have other tests, including:  Urine tests.  Blood tests.  Tests for STIs (sexually transmitted infections). If you have had more than one UTI, a cystoscopy or imaging studies may be done to determine the cause of the infections. How is this treated? Treatment for this condition includes:  Antibiotic medicine.  Over-the-counter medicines to treat discomfort.  Drinking enough water to stay hydrated. If you have frequent infections or have other conditions such as a kidney stone, you may need to see a health care provider who specializes in the urinary tract (urologist). In rare cases, urinary tract infections can cause sepsis. Sepsis is a life-threatening condition that occurs when the body responds to an infection. Sepsis is treated in the hospital with IV antibiotics, fluids, and other medicines. Follow these instructions at home: Medicines  Take over-the-counter and prescription medicines only as told by your health care provider.  If you were prescribed an antibiotic medicine, take it as told by your health care provider. Do not stop using the antibiotic even if you start to feel better. General instructions  Make sure you: ? Empty your bladder often and completely. Do not hold urine for long periods of time. ? Empty your bladder after   sex. ? Wipe from front to back after urinating or having a bowel movement if you are female. Use each tissue only one time when you wipe.  Drink enough fluid to keep your urine pale yellow.  Keep all follow-up visits. This is important.   Contact a health care provider if:  Your symptoms do not get better after 1-2  days.  Your symptoms go away and then return. Get help right away if:  You have severe pain in your back or your lower abdomen.  You have a fever or chills.  You have nausea or vomiting. Summary  A urinary tract infection (UTI) is an infection of any part of the urinary tract, which includes the kidneys, ureters, bladder, and urethra.  Most urinary tract infections are caused by bacteria in your genital area.  Treatment for this condition often includes antibiotic medicines.  If you were prescribed an antibiotic medicine, take it as told by your health care provider. Do not stop using the antibiotic even if you start to feel better.  Keep all follow-up visits. This is important. This information is not intended to replace advice given to you by your health care provider. Make sure you discuss any questions you have with your health care provider. Document Revised: 12/15/2019 Document Reviewed: 12/15/2019 Elsevier Patient Education  2021 [employer]  

## 2020-10-19 LAB — URINE CULTURE
MICRO NUMBER:: 11955912
SPECIMEN QUALITY:: ADEQUATE

## 2020-10-29 ENCOUNTER — Ambulatory Visit (INDEPENDENT_AMBULATORY_CARE_PROVIDER_SITE_OTHER): Payer: Medicare HMO | Admitting: *Deleted

## 2020-10-29 ENCOUNTER — Telehealth: Payer: Self-pay

## 2020-10-29 ENCOUNTER — Telehealth: Payer: Self-pay | Admitting: Physician Assistant

## 2020-10-29 DIAGNOSIS — Z794 Long term (current) use of insulin: Secondary | ICD-10-CM

## 2020-10-29 DIAGNOSIS — E1140 Type 2 diabetes mellitus with diabetic neuropathy, unspecified: Secondary | ICD-10-CM | POA: Diagnosis not present

## 2020-10-29 DIAGNOSIS — I10 Essential (primary) hypertension: Secondary | ICD-10-CM | POA: Diagnosis not present

## 2020-10-29 DIAGNOSIS — I5032 Chronic diastolic (congestive) heart failure: Secondary | ICD-10-CM | POA: Diagnosis not present

## 2020-10-29 NOTE — Telephone Encounter (Signed)
Spoke with patient daughter she will schedule appointment face to face for Referral and skin care.

## 2020-10-29 NOTE — Chronic Care Management (AMB) (Signed)
Chronic Care Management   CCM RN Visit Note  10/29/2020 Name: [name] MRN: [id number] DOB: [birth year]  Subjective: [name] is a 80 y.o. year old female who is a primary care patient of [name], Vanessa M, PA-C. The care management team was consulted for assistance with disease management and care coordination needs.    Engaged with patient by telephone for follow up visit in response to provider referral for case management and/or care coordination services.   Consent to Services:  The patient was given information about Chronic Care Management services, agreed to services, and gave verbal consent prior to initiation of services.  Please see initial visit note for detailed documentation.   Patient agreed to services and verbal consent obtained.   Assessment: Review of patient past medical history, allergies, medications, health status, including review of consultants reports, laboratory and other test data, was performed as part of comprehensive evaluation and provision of chronic care management services.   SDOH (Social Determinants of Health) assessments and interventions performed:    CCM Care Plan  Allergies  Allergen Reactions   Sulfa Antibiotics Swelling     Facial Swelling "swelling lips"    Sulfonamide Derivatives Swelling    Lips Swelling   Other     BLOOD PRODUCT REFUSAL    Outpatient Encounter Medications as of 10/29/2020  Medication Sig Note   apixaban (ELIQUIS) 5 MG TABS tablet Take 1 tablet (5 mg total) by mouth 2 (two) times daily.    furosemide (LASIX) 80 MG tablet Take 1 tablet (80 mg total) by mouth 3 (three) times daily.    LANTUS SOLOSTAR 100 UNIT/ML Solostar Pen INJECT 25 UNITS INTO THE SKIN AT BEDTIME.    metFORMIN (GLUCOPHAGE) 1000 MG tablet TAKE 1 TABLET TWICE DAILY (Patient taking differently: Take 1,000 mg by mouth in the morning and at bedtime.)    Ascorbic Acid (VITAMIN C PO) Take 2 tablets by mouth daily.    Calcium Citrate-Vitamin D  (CALCIUM + D PO) Take 2 tablets by mouth daily.    carvedilol (COREG) 3.125 MG tablet Take 1 tablet (3.125 mg total) by mouth 2 (two) times daily with a meal.    cephALEXin (KEFLEX) 500 MG capsule Take 1 capsule (500 mg total) by mouth 3 (three) times daily. 10/29/2020: States she completed   Cholecalciferol (VITAMIN D3) 50 MCG (2000 UT) CHEW Chew 4,000 Units by mouth daily.    Cyanocobalamin (B-12 PO) Take 2 tablets by mouth daily.    HYDROcodone-acetaminophen (NORCO/VICODIN) 5-325 MG tablet Take 1 tablet by mouth 2 (two) times daily as needed for moderate pain.    Insulin Pen Needle (DROPLET PEN NEEDLES) 32G X 4 MM MISC USE AS DIRECTED ONE TIME DAILY    lisinopril (ZESTRIL) 20 MG tablet TAKE 1 TABLET (20 MG TOTAL) BY MOUTH DAILY.    potassium chloride (KLOR-CON) 10 MEQ tablet TAKE 2 TABLETS (20 MEQ TOTAL) BY MOUTH 3 TIMES DAILY. TAKE WITH LASIX    pramipexole (MIRAPEX) 1 MG tablet Take 1 tablet (1 mg total) by mouth 2 (two) times daily.    triamcinolone (KENALOG) 0.1 % Apply to red areas on legs twice a day as needed (Patient taking differently: Apply 1 application topically 2 (two) times daily as needed (redness). Apply to red areas on legs)    No facility-administered encounter medications on file as of 10/29/2020.    Patient Active Problem List   Diagnosis Date Noted   Persistent atrial fibrillation ([place]) 09/26/2020   Educated about COVID-19  virus infection 11/30/2019   Chronic respiratory failure with hypoxia and hypercapnia (HCC) 07/11/2019   Acute on chronic diastolic CHF (congestive heart failure) ([place]) 07/08/2019   Hypoxia 07/07/2019   Hypokalemia 05/03/2019   Facial asymmetry, acquired 08/20/2016   Fracture of knee prosthesis ([place]) 08/01/2016   Obesity 02/20/2016   Chronic pain syndrome 02/19/2016   Mild episode of recurrent major depressive disorder ([place]) 08/02/2015   Chronic diastolic congestive heart failure ([place]) 07/16/2015   Type 2 diabetes mellitus with diabetic neuropathy  ([place]) 09/27/2014   Hearing loss - has hearing aides, follow by audiologist 10/11/2013   No blood products ([place])  09/11/2013   Disorder of bone and cartilage 06/10/2010   Breast cancer of upper-outer quadrant of right female breast ([place]) 04/04/2010   RESTLESS LEGS SYNDROME 05/05/2007   Obstructive sleep apnea 03/01/2007   Essential hypertension 01/27/2007   Osteoarthritis 01/27/2007    Conditions to be addressed/monitored:CHF and DMII  Care Plan : Heart Failure (Adult)  Updates made by Vanessa Singleton, RN since 10/29/2020 12:00 AM     Problem: Symptom Exacerbation (Heart Failure)   Priority: Medium     Long-Range Goal: Symptom Exacerbation Prevented or Minimized   Start Date: 10/10/2020  Expected End Date: 04/15/2021  This Visit's Progress: On track  Priority: Medium  Note:   Current Barriers:  Knowledge deficit related to basic heart failure pathophysiology and self care management as evidenced by patient not able to verbalize when she needs to contact provider.  Does report weighing daily with ranges of 188-193 pounds.  Weight this morning was 190 pounds.  Denies any change in her normal shortness of breath.  Does endorse some lower extremity edema that is the same as always.  Reports compliance with nightly BIPAP and oxygen; but daughter states machine alarms frequently due to mask coming a loose. Case Manager Clinical Goal(s):  patient will weigh self daily and record patient will verbalize understanding of Heart Failure Action Plan and when to call doctor patient will take all Heart Failure mediations as prescribed patient will weigh daily and record (notifying MD of 3 lb weight gain over night or 5 lb in a week) Interventions:  Collaboration with [name], Vanessa Evens, PA-C regarding development and update of comprehensive plan of care as evidenced by provider attestation and co-signature Inter-disciplinary care team collaboration (see longitudinal plan of  care) Basic overview and discussion of pathophysiology of Heart Failure reviewed and encouraged to follow rescue plan if symptoms flare-up Provided verbal education on low sodium diet Reviewed Heart Failure Action Plan in depth  Discussed importance of daily weight and advised patient to weigh and record daily Reviewed role of diuretics in prevention of fluid overload and management of heart failure Barriers to lifestyle changes reviewed and addressed Barriers to treatment reviewed and addressed Healthy lifestyle promoted Self-awareness of signs/symptoms of worsening disease encouraged Attends all scheduled provider appointments Encouraged to eat more whole grains, fruits and vegetables, lean meats and healthy fats Discussed know when to call the doctor Assessed understanding of adherence and barriers to treatment plan, as well as lifestyle changes; develop strategies to address barriers Confirmed receipt of home care personal services list Discussed with patient and daughter home health versus personal care services and home maid assistance; encouraged daughter to consider private pay personal aide Discussed with daughter and patient to readjust BIPAP mask, make tighter so it will not slip off, if still having issues take mask to DME company and have them show how to adjust/place  Patient Goals/Self-Care Activities: Call office if I gain more than 2 pounds in one day or 5 pounds in one week Keep legs up while sitting Track weight in diary Use salt in moderation Watch for swelling in feet, ankles and legs every day Weigh myself daily  Follow Up Plan: The care management team will reach out to the patient again over the next 30 business days.       Care Plan : Diabetes Type 2 (Adult)  Updates made by Vanessa Singleton, RN since 10/29/2020 12:00 AM     Problem: Glycemic Management (Diabetes, Type 2)   Priority: Medium     Long-Range Goal: Patient will maintain Hgb A1c of below 7  within the next 90 days   Start Date: 10/10/2020  Expected End Date: 04/15/2021  This Visit's Progress: Not on track  Priority: Medium  Note:   Objective:  Lab Results  Component Value Date   HGBA1C 6.9 (H) 06/11/2020  Current Barriers:  Knowledge Deficits related to basic Diabetes pathophysiology and self care/management Reports she does not monitor her blood sugars at home.  Latest Hgb A1C 6.9.  States she is compliant with her medications.  Daughter reports, patient falls asleep very easily, even while preparing her insulin for injection and she forgets if she had already taken her insulin or not.  Patient states this happens a few times a month.  Daughter her visiting from out of town and states she would like someone to lay eyes on patient and husband a few times a month.  States she has requested home health nursing from PCP office Case Manager Clinical Goal(s):  patient will demonstrate improved adherence to prescribed treatment plan for diabetes self care/management as evidenced by: adherence to ADA/ carb modified diet adherence to prescribed medication regimen Interventions:  Collaboration with [name], Vanessa Evens, PA-C regarding development and update of comprehensive plan of care as evidenced by provider attestation and co-signature Inter-disciplinary care team collaboration (see longitudinal plan of care) Provided education to patient about basic DM disease process Reviewed medications with patient and discussed importance of medication adherence Discussed plans with patient for ongoing care management follow up and provided patient with direct contact information for care management team Provided patient with educational materials related to hypo and hyperglycemia and importance of correct treatment Barriers to adherence to treatment plan identified Blood glucose monitoring encouraged Self-awareness of signs/symptoms of hypo or hyperglycemia encouraged Encouraged to attend all  scheduled provider appointments Reviewed mutually-set A1C goal Sent Living Well with Diabetes Educational Packet Confirmed patient still has [employer] number [phone]) Encouraged patient to find CBG Meter and confirm it still works; if not contact PCP office to request prescription for new meter and supplies Encouraged and instructed patient to check blood sugars at least twice a day while on insulin injections CCM Pharmacist referral placed to help with possible pill packs for patient and possible medication compliance Again discussed with daughter home health versus private pay for home aide services and the potential for need of private pay if insurance does not cover home health Patient Goals/Self-Care Activities: Set target A1C 7 (current 6.9) Review Diabetes education mailed to you Find home CBG meter and start checking blood sugars twice a day Request new meter prescription from PCP if home meter not working Discuss pill pack with CCM Pharmacist Follow Up Plan: The care management team will reach out to the patient again over the next 30 business days.        Plan:The care  management team will reach out to the patient again over the next 30 business days.  Hubert Azure RN, MSN RN Care Management Coordinator  [facility] [phone] [email or web address]

## 2020-10-29 NOTE — Telephone Encounter (Signed)
Patients daughter  is requesting a referral for home health to help with medication management and to look at patients bruises and just look after the patients bruises

## 2020-10-29 NOTE — Telephone Encounter (Signed)
   Telephone encounter was:  Successful.  10/29/2020 Name: ALISAH GRANDBERRY MRN: [id number] DOB: [birth year]  THEIA DEZEEUW is a 80 y.o. year old female who is a primary care patient of Allwardt, Alyssa M, PA-C . The community resource team was consulted for assistance with Transportation Needs  and house keeping and [place] guide performed the following interventions: Patient provided with information about care guide support team and interviewed to confirm resource needs Discussed resources to assist with transportation, spoke to [place] pt's daughter and gave her the number to [employer] company for the transportation benefit. Also talked to her about finding a housekeeper for her parents because a personal aid is not going to do housekeeping and what they need is more of someone to keep the house up to par. [name] mentioned that her mother and father need wellness visits and medication management. She also said that her father needs help with eating/swallowing. I let her know to call the doctors office and ask for a [facility] referral for nursing and for speech therapy to help with these concerns. With pt she is concerned with bruising, swelling and possible ulcers forming . They had no other community resource needs at this time.   Follow Up Plan:  No further follow up planned at this time. The patient has been provided with needed resources.  [place], Care Management Phone: [phone] Email: [email or web address]

## 2020-10-29 NOTE — Patient Instructions (Signed)
Visit Information  PATIENT GOALS:  Goals Addressed             This Visit's Progress    (RNCM) Set My Target A1C-Diabetes Type 2   Not on track    Timeframe:  Long-Range Goal Priority:  Medium Start Date:   10/10/20                          Expected End Date:  04/16/21                     Follow Up Date 11/19/20    Set target A1C 7 (current 6.9) Review Diabetes education mailed to you Find home CBG meter and start checking blood sugars twice a day Request new meter prescription from PCP if home meter not working Discuss pill pack with CCM Pharmacist   Why is this important?   Your target A1C is decided together by you and your doctor.  It is based on several things like your age and other health issues.    Notes:       (RNCM) Track and Manage Fluids and Swelling-Heart Failure   On track    Timeframe:  Long-Range Goal Priority:  Medium Start Date:   10/10/20                          Expected End Date: 04/16/21                    Follow Up Date 11/19/20    Call office if I gain more than 2 pounds in one day or 5 pounds in one week Keep legs up while sitting Track weight in diary Use salt in moderation Watch for swelling in feet, ankles and legs every day Weigh myself daily    Why is this important?   It is important to check your weight daily and watch how much salt and liquids you have.  It will help you to manage your heart failure.    Notes:          Patient verbalizes understanding of instructions provided today and agrees to view in [place].   The care management team will reach out to the patient again over the next 30 business days.   Hubert Azure RN, MSN RN Care Management Coordinator  [place] [phone] [email or web address]

## 2020-10-31 ENCOUNTER — Telehealth: Payer: Medicare HMO

## 2020-11-06 ENCOUNTER — Telehealth: Payer: Medicare HMO

## 2020-11-13 ENCOUNTER — Ambulatory Visit: Payer: Medicare HMO

## 2020-11-13 DIAGNOSIS — E1140 Type 2 diabetes mellitus with diabetic neuropathy, unspecified: Secondary | ICD-10-CM

## 2020-11-13 DIAGNOSIS — I10 Essential (primary) hypertension: Secondary | ICD-10-CM | POA: Diagnosis not present

## 2020-11-13 DIAGNOSIS — Z794 Long term (current) use of insulin: Secondary | ICD-10-CM

## 2020-11-13 DIAGNOSIS — I5032 Chronic diastolic (congestive) heart failure: Secondary | ICD-10-CM

## 2020-11-13 NOTE — Patient Instructions (Addendum)
Ms. Facemire,  Thank you for talking with me today. I have included our care plan/goals in the following pages.   Please review and call me at [phone] with any questions.  Thanks! Ellin Mayhew, Pharm.D., BCGP Clinical Pharmacist [facility] [place] [phone] Care Plan : [place] (Adult)  Updates made by Madelin Rear, [facility] since 11/13/2020 12:00 AM     Problem: CHL AMB "PATIENT-SPECIFIC PROBLEM"      Long-Range Goal: Patient-Specific Goal   Start Date: 11/13/2020  Expected End Date: 11/13/2021  This Visit's Progress: On track  Priority: High  Note:   Current Barriers:  Unable to self administer medications as prescribed  Pharmacist Clinical Goal(s):  Patient will contact provider office for questions/concerns as evidenced notation of same in electronic health record through collaboration with PharmD and provider.   Interventions: 1:1 collaboration with Allwardt, Randa Evens, PA-C regarding development and update of comprehensive plan of care as evidenced by provider attestation and co-signature Inter-disciplinary care team collaboration (see longitudinal plan of care) Comprehensive medication review performed; medication list updated in electronic medical record  Hypertension (BP goal <130/80) -Controlled -Current treatment: Carvedilol 3.125 mg twice daily  Furosemide 80 mg three times daily   -Current home readings: none provided, does have cuff and will provide/ask for asssitance using when next seen  -Denies hypotensive/hypertensive symptoms -Educated on Symptoms of hypotension and importance of maintaining adequate hydration; -Counseled to monitor BP at home 1-2x/week in addition to daily weights, document, and provide log at future appointments -Recommended to continue current medication  Diabetes (A1c goal <7%) -Controlled -Has not located blood glucose meter, is open to receiving meter now. -Current  medications: Metformin 1000 mg twice daily Lantus 25 units into the skin at bedtime  -Current home glucose readings fasting glucose: is not testing -Denies hypoglycemic/hyperglycemic symptoms -Current exercise: no formal exercise -Educated on A1c and blood sugar goals; Prevention and management of hypoglycemic episodes; Benefits of routine self-monitoring of blood sugar; -Recommended to continue current medication Will order new BG monitor after review  Patient Goals/Self-Care Activities Patient will:  - focus on medication adherence by using pill packaging if cost effective check glucose at least once daily in the monring, document, and provide at future appointments  Follow Up Plan: [facility] f/u 1 month, CPA perform review on BG monitor and cost review on medication management     The patient verbalized understanding of instructions provided today and agreed to receive a MyChart copy of patient instruction and/or educational materials. Telephone follow up appointment with pharmacy team member scheduled for: See next appointment with "Care Management Staff" under "What's Next" below.

## 2020-11-13 NOTE — Progress Notes (Signed)
Chronic Care Management Pharmacy Note  11/13/2020 Name:  [name] MRN:  [id number] DOB:  1940-12-10  Recommendations/changes: will contact [employer] to see preferred BG monitor and to see if pill packing is an option and what the cost would be. Will also review cost with [facility] to see if this would be a good option given no charge pill pack and delivery   Subjective: [name] is an 80 y.o. year old female who is a primary patient of Allwardt, Alyssa M, PA-C.  The CCM team was consulted for assistance with disease management and care coordination needs.    Engaged with patient by telephone for follow up visit in response to provider referral for pharmacy case management and/or care coordination services.   Consent to Services:  The patient was given information about Chronic Care Management services, agreed to services, and gave verbal consent prior to initiation of services.  Please see initial visit note for detailed documentation.   Patient Care Team: Allwardt, Randa Evens, PA-C as PCP - General (Physician Assistant) Minus Breeding, MD as PCP - Cardiology (Cardiology) Suella Broad, MD as Consulting Physician (Physical Medicine and Rehabilitation) Minus Breeding, MD as Consulting Physician (Cardiology) Madelin Headings, DO (Optometry) Madelin Rear, [facility] as Pharmacist (Pharmacist) Leona Singleton, RN as Case Manager  Objective:  Lab Results  Component Value Date   CREATININE 0.91 09/27/2020   CREATININE 0.85 08/29/2020   CREATININE 0.83 07/31/2020    Lab Results  Component Value Date   HGBA1C 6.9 (H) 06/11/2020   Last diabetic Eye exam:  Lab Results  Component Value Date/Time   HMDIABEYEEXA No Retinopathy 12/20/2018 12:00 AM    Last diabetic Foot exam:  Lab Results  Component Value Date/Time   HMDIABFOOTEX done 02/14/2010 12:00 AM        Component Value Date/Time   CHOL 101 06/11/2020 1032   TRIG 95.0 06/11/2020 1032   HDL 31.40 (L) 06/11/2020  1032   CHOLHDL 3 06/11/2020 1032   VLDL 19.0 06/11/2020 1032   LDLCALC 51 06/11/2020 1032   LDLDIRECT 111.6 02/14/2010 1406    Hepatic Function Latest Ref Rng & Units 06/11/2020 01/03/2020 12/27/2018  Total Protein 6.0 - 8.3 g/dL 7.0 6.9 7.2  Albumin 3.5 - 5.2 g/dL 3.7 3.9 4.1  AST 0 - 37 U/L _0 ALT 0 - 35 U/L _1 Alk Phosphatase 39 - 117 U/L 102 88 104  Total Bilirubin 0.2 - 1.2 mg/dL 0.6 0.6 0.5  Bilirubin, Direct 0.1 - 0.5 mg/dL - - -    Lab Results  Component Value Date/Time   TSH 0.902 07/08/2019 01:02 PM   TSH 1.993 08/01/2016 02:48 PM   TSH 0.74 10/16/2011 01:48 PM   TSH 1.64 10/20/2006 11:12 AM    CBC Latest Ref Rng & Units 09/27/2020 05/27/2020 04/25/2020  WBC 3.4 - 10.8 x10E3/uL 8.8 10.3 9.8  Hemoglobin 11.1 - 15.9 g/dL 14.0 14.3 14.3  Hematocrit 34.0 - 46.6 % 41.4 42.4 42.6  Platelets 150 - 450 x10E3/uL 240 269 248    Lab Results  Component Value Date/Time   VD25OH 32.73 01/03/2020 11:44 AM   VD25OH 22.48 (L) 12/18/2019 11:02 AM    Clinical ASCVD:  The ASCVD Risk score Mikey Bussing DC Jr., et al., 2013) failed to calculate for the following reasons:   The 2013 ASCVD risk score is only valid for ages 47 to 46     Social History   Tobacco Use  Smoking Status  Never  Smokeless Tobacco Never   BP Readings from Last 3 Encounters:  10/16/20 (!) 144/80  09/27/20 128/82  08/26/20 (!) 126/95   Pulse Readings from Last 3 Encounters:  10/16/20 (!) 108  09/27/20 79  08/26/20 75   Wt Readings from Last 3 Encounters:  10/16/20 194 lb (88 kg)  09/27/20 197 lb (89.4 kg)  08/21/20 196 lb 8 oz (89.1 kg)    Assessment: Review of patient past medical history, allergies, medications, health status, including review of consultants reports, laboratory and other test data, was performed as part of comprehensive evaluation and provision of chronic care management services.   SDOH:  (Social Determinants of Health) assessments and interventions performed:    CCM  Care Plan  Allergies  Allergen Reactions   Sulfa Antibiotics Swelling     Facial Swelling "swelling lips"    Sulfonamide Derivatives Swelling    Lips Swelling   Other     BLOOD PRODUCT REFUSAL    Medications Reviewed Today     Reviewed by Leona Singleton, RN (Registered Nurse) on 10/29/20 at 1141  Med List Status: <None>   Medication Order Taking? Sig Documenting Provider Last Dose Status Informant  apixaban (ELIQUIS) 5 MG TABS tablet [id number] Yes Take 1 tablet (5 mg total) by mouth 2 (two) times daily. Minus Breeding, MD Taking Active Self  Ascorbic Acid (VITAMIN C PO) [id number]  Take 2 tablets by mouth daily. [provider]  Active Self  Calcium Citrate-Vitamin D (CALCIUM + D PO) [id number]  Take 2 tablets by mouth daily. [provider]  Active Self  carvedilol (COREG) 3.125 MG tablet [id number]  Take 1 tablet (3.125 mg total) by mouth 2 (two) times daily with a meal. Orma Flaming, MD  Active   cephALEXin (KEFLEX) 500 MG capsule [id number]  Take 1 capsule (500 mg total) by mouth 3 (three) times daily. Eulas Post, MD  Active            Med Note [facility] Oct 29, 2020 11:41 AM) States she completed  Cholecalciferol (VITAMIN D3) 50 MCG (2000 UT) CHEW [id number]  Chew 4,000 Units by mouth daily. [provider]  Active Self  Cyanocobalamin (B-12 PO) [id number]  Take 2 tablets by mouth daily. [provider]  Active Self  furosemide (LASIX) 80 MG tablet [id number] Yes Take 1 tablet (80 mg total) by mouth 3 (three) times daily. Allwardt, Randa Evens, PA-C Taking Active   HYDROcodone-acetaminophen (NORCO/VICODIN) 5-325 MG tablet [id number]  Take 1 tablet by mouth 2 (two) times daily as needed for moderate pain. [provider]  Active Self  Insulin Pen Needle (DROPLET PEN NEEDLES) 32G X 4 MM MISC [id number]  USE AS DIRECTED ONE TIME DAILY Orma Flaming, MD  Active   LANTUS SOLOSTAR 100 UNIT/ML Solostar Pen [id number] Yes  INJECT 25 UNITS INTO THE SKIN AT BEDTIME. Brunetta Jeans, PA-C Taking Active Self           Med Note Iona Beard, [place] A   Fri Sep 27, 2020 10:44 AM)    lisinopril (ZESTRIL) 20 MG tablet [id number]  TAKE 1 TABLET (20 MG TOTAL) BY MOUTH DAILY. Brunetta Jeans, PA-C  Active Self  metFORMIN (GLUCOPHAGE) 1000 MG tablet [id number] Yes TAKE 1 TABLET TWICE DAILY  Patient taking differently: Take 1,000 mg by mouth in the morning and at bedtime.   Dutch Quint B, FNP Taking Active   potassium chloride (KLOR-CON) 10 MEQ tablet [id number]  TAKE 2  TABLETS (20 MEQ TOTAL) BY MOUTH 3 TIMES DAILY. TAKE WITH Lara Mulch, MD  Active   pramipexole (MIRAPEX) 1 MG tablet [id number]  Take 1 tablet (1 mg total) by mouth 2 (two) times daily. Orma Flaming, MD  Active   triamcinolone (KENALOG) 0.1 % [id number]  Apply to red areas on legs twice a day as needed  Patient taking differently: Apply 1 application topically 2 (two) times daily as needed (redness). Apply to red areas on legs   Leamon Arnt, MD  Active             Patient Active Problem List   Diagnosis Date Noted   Persistent atrial fibrillation ([place]) 09/26/2020   Educated about COVID-19 virus infection 11/30/2019   Chronic respiratory failure with hypoxia and hypercapnia ([place]) 07/11/2019   Acute on chronic diastolic CHF (congestive heart failure) ([place]) 07/08/2019   Hypoxia 07/07/2019   Hypokalemia 05/03/2019   Facial asymmetry, acquired 08/20/2016   Fracture of knee prosthesis ([place]) 08/01/2016   Obesity 02/20/2016   Chronic pain syndrome 02/19/2016   Mild episode of recurrent major depressive disorder ([place]) 08/02/2015   Chronic diastolic congestive heart failure ([place]) 07/16/2015   Type 2 diabetes mellitus with diabetic neuropathy ([place]) 09/27/2014   Hearing loss - has hearing aides, follow by audiologist 10/11/2013   No blood products ([place])  09/11/2013   Disorder of bone and cartilage 06/10/2010   Breast cancer of  upper-outer quadrant of right female breast ([place]) 04/04/2010   RESTLESS LEGS SYNDROME 05/05/2007   Obstructive sleep apnea 03/01/2007   Essential hypertension 01/27/2007   Osteoarthritis 01/27/2007    Immunization History  Administered Date(s) Administered   Fluad Quad(high Dose 65+) 01/24/2019   Influenza Split 02/20/2011, 01/25/2012   Influenza Whole 02/16/1999, 02/25/2007, 03/07/2009, 02/14/2010   Influenza, High Dose Seasonal PF 02/17/2016, 03/23/2017, 01/20/2018   Influenza,inj,Quad PF,6+ Mos 02/01/2013, 02/19/2014, 01/30/2015   Influenza,inj,quad, With Preservative 02/15/2017   PFIZER Comirnaty(Gray Top)Covid-19 Tri-Sucrose Vaccine 07/01/2020   PFIZER(Purple Top)SARS-COV-2 Vaccination 06/06/2019, 06/26/2019   Pneumococcal Conjugate-13 01/30/2015   Pneumococcal Polysaccharide-23 10/11/2013   Td 05/18/2005   Tdap 10/21/2015   Zoster, Live 02/20/2011    Conditions to be addressed/monitored: Afib, OSA, CHF, T2DM  Care Plan : General Pharmacy (Adult)  Updates made by Madelin Rear, [place] since 11/13/2020 12:00 AM     Problem: CHL AMB "PATIENT-SPECIFIC PROBLEM"      Long-Range Goal: Patient-Specific Goal   Start Date: 11/13/2020  Expected End Date: 11/13/2021  This Visit's Progress: On track  Priority: High  Note:   Current Barriers:  Unable to self administer medications as prescribed  Pharmacist Clinical Goal(s):  Patient will contact provider office for questions/concerns as evidenced notation of same in electronic health record through collaboration with PharmD and provider.   Interventions: 1:1 collaboration with Allwardt, Randa Evens, PA-C regarding development and update of comprehensive plan of care as evidenced by provider attestation and co-signature Inter-disciplinary care team collaboration (see longitudinal plan of care) Comprehensive medication review performed; medication list updated in electronic medical record  Hypertension (BP goal  <130/80) -Controlled -Current treatment: Carvedilol 3.125 mg twice daily  Lisinopril 20 mg once daily Furosemide 80 mg three times daily   -Current home readings: none provided, does have cuff and will provide/ask for asssitance using when next seen  -Denies hypotensive/hypertensive symptoms -Educated on Symptoms of hypotension and importance of maintaining adequate hydration; -Counseled to monitor BP at home 1-2x/week in addition to daily weights, document, and provide log at future  appointments -Recommended to continue current medication  Diabetes (A1c goal <7%) -Controlled -Has not located blood glucose meter, is open to receiving meter now. -Current medications: Metformin 1000 mg twice daily Lantus 25 units into the skin at bedtime  -Current home glucose readings fasting glucose: is not testing -Denies hypoglycemic/hyperglycemic symptoms -Current exercise: no formal exercise -Educated on A1c and blood sugar goals; Prevention and management of hypoglycemic episodes; Benefits of routine self-monitoring of blood sugar; -Recommended to continue current medication Will order new BG monitor after review  Patient Goals/Self-Care Activities Patient will:  - focus on medication adherence by using pill packaging if cost effective check glucose at least once daily in the monring, document, and provide at future appointments  Follow Up Plan: [place] f/u 1 month, CPA perform review on BG monitor and cost review on medication management    Patient's preferred pharmacy is:  [facility] [place], [place] [address] [place] [address] Phone: [phone] Fax: [phone]  [facility] ([facility] - [place], [place] [address] Phone: [phone] Fax: [phone]  [id number] [place] - [address] [place] [id number]: [phone]: [phone] Follow Up:  Patient agrees to Care Plan and Follow-up. Future Appointments  Date Time Provider [place] 11/19/2020 10:15 AM [facility]  10/13/2021  8:45 AM [facility] HEALTH COACH [facility] PEC   JMadelin Rear PharmD, CPP Clinical Pharmacist Practitioner  [facility]  [phone]

## 2020-11-14 NOTE — Progress Notes (Signed)
    Chronic Care Management Pharmacy Assistant   Name: [name]  MRN: [id number] DOB: Nov 22, 1940  Reason for Encounter: Pharmacy Cost Review  Medications: Outpatient Encounter Medications as of 11/13/2020  Medication Sig Note   apixaban (ELIQUIS) 5 MG TABS tablet Take 1 tablet (5 mg total) by mouth 2 (two) times daily.    Ascorbic Acid (VITAMIN C PO) Take 2 tablets by mouth daily.    Calcium Citrate-Vitamin D (CALCIUM + D PO) Take 2 tablets by mouth daily.    carvedilol (COREG) 3.125 MG tablet Take 1 tablet (3.125 mg total) by mouth 2 (two) times daily with a meal.    cephALEXin (KEFLEX) 500 MG capsule Take 1 capsule (500 mg total) by mouth 3 (three) times daily. 10/29/2020: States she completed   Cholecalciferol (VITAMIN D3) 50 MCG (2000 UT) CHEW Chew 4,000 Units by mouth daily.    Cyanocobalamin (B-12 PO) Take 2 tablets by mouth daily.    furosemide (LASIX) 80 MG tablet Take 1 tablet (80 mg total) by mouth 3 (three) times daily.    HYDROcodone-acetaminophen (NORCO/VICODIN) 5-325 MG tablet Take 1 tablet by mouth 2 (two) times daily as needed for moderate pain.    Insulin Pen Needle (DROPLET PEN NEEDLES) 32G X 4 MM MISC USE AS DIRECTED ONE TIME DAILY    LANTUS SOLOSTAR 100 UNIT/ML Solostar Pen INJECT 25 UNITS INTO THE SKIN AT BEDTIME.    lisinopril (ZESTRIL) 20 MG tablet TAKE 1 TABLET (20 MG TOTAL) BY MOUTH DAILY.    metFORMIN (GLUCOPHAGE) 1000 MG tablet TAKE 1 TABLET TWICE DAILY (Patient taking differently: Take 1,000 mg by mouth in the morning and at bedtime.)    potassium chloride (KLOR-CON) 10 MEQ tablet TAKE 2 TABLETS (20 MEQ TOTAL) BY MOUTH 3 TIMES DAILY. TAKE WITH LASIX    pramipexole (MIRAPEX) 1 MG tablet Take 1 tablet (1 mg total) by mouth 2 (two) times daily.    triamcinolone (KENALOG) 0.1 % Apply to red areas on legs twice a day as needed (Patient taking differently: Apply 1 application topically 2 (two) times daily as needed (redness). Apply to red areas on legs)    No  facility-administered encounter medications on file as of 11/13/2020.   Performed cost analysis for [facility] order pharmacy and [place]: Preferred in- network pharmacy  Yearly cost: [phone] [facility]: Standard in- network pharmacy  Yearly Cost: $1,804.77  Called and spoke with Cristie Hem at [facility]. Preferred DME suppliers are: [place] [place] [phone] [place] [phone]  [employer] also does not offer pill packaging .  Wilford Sports CPA, CMA

## 2020-11-19 ENCOUNTER — Ambulatory Visit (INDEPENDENT_AMBULATORY_CARE_PROVIDER_SITE_OTHER): Payer: Medicare HMO | Admitting: *Deleted

## 2020-11-19 DIAGNOSIS — Z794 Long term (current) use of insulin: Secondary | ICD-10-CM

## 2020-11-19 DIAGNOSIS — E1140 Type 2 diabetes mellitus with diabetic neuropathy, unspecified: Secondary | ICD-10-CM

## 2020-11-19 DIAGNOSIS — I5032 Chronic diastolic (congestive) heart failure: Secondary | ICD-10-CM | POA: Diagnosis not present

## 2020-11-19 NOTE — Patient Instructions (Signed)
Visit Information  PATIENT GOALS:  Goals Addressed             This Visit's Progress    (RNCM) Set My Target A1C-Diabetes Type 2   Not on track    Timeframe:  Long-Range Goal Priority:  Medium Start Date:   10/10/20                          Expected End Date:  04/16/21                     Follow Up Date 11/28/20    Set target A1C 7 (current 6.9) Review Diabetes education mailed to you Request new meter prescription from PCP if home meter not working Discuss pill pack with CCM Pharmacist Attend appointment with PCP on 11/20/20 Discuss home health with PCP    Why is this important?   Your target A1C is decided together by you and your doctor.  It is based on several things like your age and other health issues.    Notes:       (RNCM) Track and Manage Fluids and Swelling-Heart Failure   Not on track    Timeframe:  Long-Range Goal Priority:  Medium Start Date:   10/10/20                          Expected End Date: 04/16/21                    Follow Up Date 11/28/20    Call office if I gain more than 2 pounds in one day or 5 pounds in one week Keep legs up while sitting Weigh myself daily and track weight in diary Use salt in moderation Watch for swelling in feet, ankles and legs every day Fall precautions and prevention; use walker with all ambulation   Why is this important?   It is important to check your weight daily and watch how much salt and liquids you have.  It will help you to manage your heart failure.    Notes:          Patient verbalizes understanding of instructions provided today and agrees to view in [place].   The care management team will reach out to the patient again over the next 14 business days.   Hubert Azure RN, MSN RN Care Management Coordinator  [facility] [phone] [email or web address]

## 2020-11-19 NOTE — Chronic Care Management (AMB) (Signed)
Chronic Care Management   CCM RN Visit Note  11/19/2020 Name: [name] MRN: [id number] DOB: Jan 07, 1941  Subjective: [name] is a 80 y.o. year old female who is a primary care patient of Allwardt, Alyssa M, PA-C. The care management team was consulted for assistance with disease management and care coordination needs.    Engaged with patient by telephone for follow up visit in response to provider referral for case management and/or care coordination services.   Consent to Services:  The patient was given information about Chronic Care Management services, agreed to services, and gave verbal consent prior to initiation of services.  Please see initial visit note for detailed documentation.   Patient agreed to services and verbal consent obtained.   Assessment: Review of patient past medical history, allergies, medications, health status, including review of consultants reports, laboratory and other test data, was performed as part of comprehensive evaluation and provision of chronic care management services.   SDOH (Social Determinants of Health) assessments and interventions performed:    CCM Care Plan  Allergies  Allergen Reactions   Sulfa Antibiotics Swelling     Facial Swelling "swelling lips"    Sulfonamide Derivatives Swelling    Lips Swelling   Other     BLOOD PRODUCT REFUSAL    Outpatient Encounter Medications as of 11/19/2020  Medication Sig Note   apixaban (ELIQUIS) 5 MG TABS tablet Take 1 tablet (5 mg total) by mouth 2 (two) times daily.    Ascorbic Acid (VITAMIN C PO) Take 2 tablets by mouth daily.    Calcium Citrate-Vitamin D (CALCIUM + D PO) Take 2 tablets by mouth daily.    carvedilol (COREG) 3.125 MG tablet Take 1 tablet (3.125 mg total) by mouth 2 (two) times daily with a meal.    cephALEXin (KEFLEX) 500 MG capsule Take 1 capsule (500 mg total) by mouth 3 (three) times daily. 10/29/2020: States she completed   Cholecalciferol (VITAMIN D3) 50 MCG (2000  UT) CHEW Chew 4,000 Units by mouth daily.    Cyanocobalamin (B-12 PO) Take 2 tablets by mouth daily.    furosemide (LASIX) 80 MG tablet Take 1 tablet (80 mg total) by mouth 3 (three) times daily.    HYDROcodone-acetaminophen (NORCO/VICODIN) 5-325 MG tablet Take 1 tablet by mouth 2 (two) times daily as needed for moderate pain.    Insulin Pen Needle (DROPLET PEN NEEDLES) 32G X 4 MM MISC USE AS DIRECTED ONE TIME DAILY    LANTUS SOLOSTAR 100 UNIT/ML Solostar Pen INJECT 25 UNITS INTO THE SKIN AT BEDTIME.    lisinopril (ZESTRIL) 20 MG tablet TAKE 1 TABLET (20 MG TOTAL) BY MOUTH DAILY.    metFORMIN (GLUCOPHAGE) 1000 MG tablet TAKE 1 TABLET TWICE DAILY (Patient taking differently: Take 1,000 mg by mouth in the morning and at bedtime.)    potassium chloride (KLOR-CON) 10 MEQ tablet TAKE 2 TABLETS (20 MEQ TOTAL) BY MOUTH 3 TIMES DAILY. TAKE WITH LASIX    pramipexole (MIRAPEX) 1 MG tablet Take 1 tablet (1 mg total) by mouth 2 (two) times daily.    triamcinolone (KENALOG) 0.1 % Apply to red areas on legs twice a day as needed (Patient taking differently: Apply 1 application topically 2 (two) times daily as needed (redness). Apply to red areas on legs)    No facility-administered encounter medications on file as of 11/19/2020.    Patient Active Problem List   Diagnosis Date Noted   Persistent atrial fibrillation ([place]) 09/26/2020   Educated about COVID-19  virus infection 11/30/2019   Chronic respiratory failure with hypoxia and hypercapnia ([facility]) 07/11/2019   Acute on chronic diastolic CHF (congestive heart failure) ([place]) 07/08/2019   Hypoxia 07/07/2019   Hypokalemia 05/03/2019   Facial asymmetry, acquired 08/20/2016   Fracture of knee prosthesis ([place]) 08/01/2016   Obesity 02/20/2016   Chronic pain syndrome 02/19/2016   Mild episode of recurrent major depressive disorder ([place]) 08/02/2015   Chronic diastolic congestive heart failure ([place]) 07/16/2015   Type 2 diabetes mellitus with diabetic neuropathy  ([place]) 09/27/2014   Hearing loss - has hearing aides, follow by audiologist 10/11/2013   No blood products ([place])  09/11/2013   Disorder of bone and cartilage 06/10/2010   Breast cancer of upper-outer quadrant of right female breast ([place]) 04/04/2010   RESTLESS LEGS SYNDROME 05/05/2007   Obstructive sleep apnea 03/01/2007   Essential hypertension 01/27/2007   Osteoarthritis 01/27/2007    Conditions to be addressed/monitored:CHF and DMII  Care Plan : Heart Failure (Adult)  Updates made by Leona Singleton, RN since 11/19/2020 12:00 AM     Problem: Symptom Exacerbation (Heart Failure)   Priority: Medium     Long-Range Goal: Symptom Exacerbation Prevented or Minimized   Start Date: 10/10/2020  Expected End Date: 04/15/2021  This Visit's Progress: Not on track  Recent Progress: On track  Priority: Medium  Note:   Current Barriers:  Knowledge deficit related to basic heart failure pathophysiology and self care management as evidenced by patient not able to verbalize when she needs to contact provider.  Does report weighing daily with ranges of 189-193 pounds.  Weight this morning was 191 pounds.  Denies any change in her normal shortness of breath.  Does endorse some lower extremity edema that is due to increase in activity for the past few days related to increase in back pain.  Patient reporting she had fall this morning while standing at counter making sandwich.  Fell on her right side, denies hitting her head.  States walker was in front of her.  Was able to crawl to living room and seek assistance from her husband and lift chair to get up.  Patient is alert and oriented but seems to be a little forgetful. Case Manager Clinical Goal(s):  patient will weigh self daily and record patient will verbalize understanding of Heart Failure Action Plan and when to call doctor patient will take all Heart Failure mediations as prescribed patient will weigh daily and record (notifying MD  of 3 lb weight gain over night or 5 lb in a week) Interventions:  Collaboration with Allwardt, Randa Evens, PA-C regarding development and update of comprehensive plan of care as evidenced by provider attestation and co-signature Inter-disciplinary care team collaboration (see longitudinal plan of care) Basic overview and discussion of pathophysiology of Heart Failure reviewed and encouraged to follow rescue plan if symptoms flare-up Provided verbal education on low sodium diet Reviewed Heart Failure Action Plan in depth  Discussed importance of daily weight and advised patient to weigh and record daily Reviewed role of diuretics in prevention of fluid overload and management of heart failure Barriers to lifestyle changes reviewed and addressed Barriers to treatment reviewed and addressed and healthy lifestyle promoted Self-awareness of signs/symptoms of worsening disease encouraged Attends all scheduled provider appointments Encouraged to eat more whole grains, fruits and vegetables, lean meats and healthy fats Discussed know when to call the doctor Assessed understanding of adherence and barriers to treatment plan, as well as lifestyle changes; develop strategies to address barriers  Encouraged patient to review home care personal services list and to consider private paying for assistance at home Discussed with patient home health versus personal care services and home maid assistance; encouraged patient/daughter to consider private pay personal aide Reviewed conditions of fall this morning with patient, fall precautions and preventions reviewed and discussed, encouraged patient to request/discuss home health nursing/physical therapy at PCP appointment tomorrow Encouraged use of walker with all ambulation Encouraged patient to discuss fall with daughter Confirmed with patient husband is to transport to medical appointment tomorrow Patient Goals/Self-Care Activities: Call office if I gain more  than 2 pounds in one day or 5 pounds in one week Keep legs up while sitting Weigh myself daily and track weight in diary Use salt in moderation Watch for swelling in feet, ankles and legs every day Fall precautions and prevention; use walker with all ambulation Follow Up Plan: The care management team will reach out to the patient again over the next 14 business days.       Care Plan : Diabetes Type 2 (Adult)  Updates made by Leona Singleton, RN since 11/19/2020 12:00 AM     Problem: Glycemic Management (Diabetes, Type 2)   Priority: Medium     Long-Range Goal: Patient will maintain Hgb A1c of below 7 within the next 90 days   Start Date: 10/10/2020  Expected End Date: 04/15/2021  This Visit's Progress: Not on track  Recent Progress: Not on track  Priority: Medium  Note:   Objective:  Lab Results  Component Value Date   HGBA1C 6.9 (H) 06/11/2020  Current Barriers:  Knowledge Deficits related to basic Diabetes pathophysiology and self care/management Reports she does not monitor her blood sugars at home.  Latest Hgb A1C 6.9.  States she is compliant with her medications.  Patient stating she did find her home CBG meter, but it is old and strips do not work.  Still not checking home blood sugars.   Daughter previously stated she has requested home health nursing from PCP office Case Manager Clinical Goal(s):  patient will demonstrate improved adherence to prescribed treatment plan for diabetes self care/management as evidenced by: adherence to ADA/ carb modified diet adherence to prescribed medication regimen Interventions:  Collaboration with Allwardt, Randa Evens, PA-C regarding development and update of comprehensive plan of care as evidenced by provider attestation and co-signature Inter-disciplinary care team collaboration (see longitudinal plan of care) Provided education to patient about basic DM disease process Reviewed medications with patient and discussed importance of  medication adherence Discussed plans with patient for ongoing care management follow up and provided patient with direct contact information for care management team Provided patient with educational materials related to hypo and hyperglycemia and importance of correct treatment Barriers to adherence to treatment plan identified Blood glucose monitoring encouraged Self-awareness of signs/symptoms of hypo or hyperglycemia encouraged Encouraged to attend all scheduled provider appointments Reviewed mutually-set A1C goal [facility] Living Well with Diabetes Educational Packet and encouraged patient to review Confirmed patient still has [place] number [phone]) Confirmed patient did find CBG Meter (found but states it is old and strips does not work); encouraged to contact PCP office to request prescription for new meter and supplies Encouraged and instructed patient to check blood sugars at least twice a day while on insulin injections Encouraged to continue to work with CCM Pharmacist to help with possible pill packs for patient and possible medication compliance Discussed home health nursing/therapy safety evaluation Patient Goals/Self-Care Activities: Set target A1C 7 (current  6.9) Review Diabetes education mailed to you Request new meter prescription from PCP if home meter not working Discuss pill pack with CCM Pharmacist Attend appointment with PCP on 11/20/20 Discuss home health with PCP  Follow Up Plan: The care management team will reach out to the patient again over the next 14 business days.        Plan:The care management team will reach out to the patient again over the next 14 business days.  Hubert Azure RN, MSN RN Care Management Coordinator  [facility] [phone] [email or web address]

## 2020-11-20 ENCOUNTER — Ambulatory Visit (INDEPENDENT_AMBULATORY_CARE_PROVIDER_SITE_OTHER): Payer: Medicare HMO | Admitting: Physician Assistant

## 2020-11-20 ENCOUNTER — Other Ambulatory Visit: Payer: Self-pay

## 2020-11-20 VITALS — BP 158/92 | HR 83 | Temp 97.2°F | Ht 61.0 in | Wt 192.0 lb

## 2020-11-20 DIAGNOSIS — R296 Repeated falls: Secondary | ICD-10-CM

## 2020-11-20 DIAGNOSIS — G479 Sleep disorder, unspecified: Secondary | ICD-10-CM | POA: Diagnosis not present

## 2020-11-20 DIAGNOSIS — Z9189 Other specified personal risk factors, not elsewhere classified: Secondary | ICD-10-CM | POA: Diagnosis not present

## 2020-11-20 MED ORDER — BLOOD GLUCOSE METER KIT
PACK | 0 refills | Status: DC
Start: 2020-11-20 — End: 2021-01-13
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
## 2020-11-25 ENCOUNTER — Other Ambulatory Visit: Payer: Self-pay

## 2020-11-25 DIAGNOSIS — E1159 Type 2 diabetes mellitus with other circulatory complications: Secondary | ICD-10-CM

## 2020-11-25 DIAGNOSIS — I152 Hypertension secondary to endocrine disorders: Secondary | ICD-10-CM

## 2020-11-25 MED ORDER — LISINOPRIL 20 MG PO TABS: 20.0000 mg | ORAL_TABLET | Freq: Every day | ORAL | 2 refills | Status: DC

## 2020-11-28 ENCOUNTER — Ambulatory Visit: Payer: Medicare HMO | Admitting: *Deleted

## 2020-11-28 DIAGNOSIS — R296 Repeated falls: Secondary | ICD-10-CM

## 2020-11-28 DIAGNOSIS — Z794 Long term (current) use of insulin: Secondary | ICD-10-CM

## 2020-11-28 DIAGNOSIS — I5032 Chronic diastolic (congestive) heart failure: Secondary | ICD-10-CM

## 2020-11-28 DIAGNOSIS — E1140 Type 2 diabetes mellitus with diabetic neuropathy, unspecified: Secondary | ICD-10-CM

## 2020-11-28 NOTE — Patient Instructions (Signed)
Visit Information  PATIENT GOALS:  Goals Addressed             This Visit's Progress    (RNCM) Set My Target A1C-Diabetes Type 2   Not on track    Timeframe:  Long-Range Goal Priority:  Medium Start Date:   10/10/20                          Expected End Date:  04/16/21                     Follow Up Date 12/12/20    Set target A1C 7 (current 6.9) Review Diabetes education mailed to you Request new meter prescription from PCP if home meter not working Discuss pill pack with CCM Pharmacist Work with home health nursing and therapist   Why is this important?   Your target A1C is decided together by you and your doctor.  It is based on several things like your age and other health issues.    Notes:      (RNCM) Track and Manage Fluids and Swelling-Heart Failure   Not on track    Timeframe:  Long-Range Goal Priority:  Medium Start Date:   10/10/20                          Expected End Date: 04/16/21                    Follow Up Date 12/12/20    Call office if I gain more than 2 pounds in one day or 5 pounds in one week Keep legs up while sitting Weigh myself daily and track weight in diary Use salt in moderation Watch for swelling in feet, ankles and legs every day Fall precautions and prevention; use walker with all ambulation   Why is this important?   It is important to check your weight daily and watch how much salt and liquids you have.  It will help you to manage your heart failure.    Notes:         Patient verbalizes understanding of instructions provided today and agrees to view in [place].   The care management team will reach out to the patient again over the next 21 business days.   Hubert Azure RN, MSN RN Care Management Coordinator  [facility] [phone] [email or web address]

## 2020-11-28 NOTE — Chronic Care Management (AMB) (Signed)
Chronic Care Management   CCM RN Visit Note  11/28/2020 Name: [name] MRN: [id number] DOB: 02-10-1941  Subjective: [name] is a 80 y.o. year old female who is a primary care patient of Allwardt, Alyssa M, PA-C. The care management team was consulted for assistance with disease management and care coordination needs.    Engaged with patient by telephone for follow up visit in response to provider referral for case management and/or care coordination services.   Consent to Services:  The patient was given information about Chronic Care Management services, agreed to services, and gave verbal consent prior to initiation of services.  Please see initial visit note for detailed documentation.   Patient agreed to services and verbal consent obtained.   Assessment: Review of patient past medical history, allergies, medications, health status, including review of consultants reports, laboratory and other test data, was performed as part of comprehensive evaluation and provision of chronic care management services.   SDOH (Social Determinants of Health) assessments and interventions performed:    CCM Care Plan  Allergies  Allergen Reactions   Sulfa Antibiotics Swelling     Facial Swelling "swelling lips"    Sulfonamide Derivatives Swelling    Lips Swelling   Other     BLOOD PRODUCT REFUSAL    Outpatient Encounter Medications as of 11/28/2020  Medication Sig Note   apixaban (ELIQUIS) 5 MG TABS tablet Take 1 tablet (5 mg total) by mouth 2 (two) times daily.    Ascorbic Acid (VITAMIN C PO) Take 2 tablets by mouth daily.    blood glucose meter kit and supplies Dispense based on patient and insurance preference. Use up to two  times daily as directed. (FOR ICD-10 E10.9, E11.9).    Calcium Citrate-Vitamin D (CALCIUM + D PO) Take 2 tablets by mouth daily.    carvedilol (COREG) 3.125 MG tablet Take 1 tablet (3.125 mg total) by mouth 2 (two) times daily with a meal.    cephALEXin  (KEFLEX) 500 MG capsule Take 1 capsule (500 mg total) by mouth 3 (three) times daily. 10/29/2020: States she completed   Cholecalciferol (VITAMIN D3) 50 MCG (2000 UT) CHEW Chew 4,000 Units by mouth daily.    Cyanocobalamin (B-12 PO) Take 2 tablets by mouth daily.    furosemide (LASIX) 80 MG tablet Take 1 tablet (80 mg total) by mouth 3 (three) times daily.    HYDROcodone-acetaminophen (NORCO/VICODIN) 5-325 MG tablet Take 1 tablet by mouth 2 (two) times daily as needed for moderate pain.    Insulin Pen Needle (DROPLET PEN NEEDLES) 32G X 4 MM MISC USE AS DIRECTED ONE TIME DAILY    LANTUS SOLOSTAR 100 UNIT/ML Solostar Pen INJECT 25 UNITS INTO THE SKIN AT BEDTIME.    lisinopril (ZESTRIL) 20 MG tablet Take 1 tablet (20 mg total) by mouth daily.    metFORMIN (GLUCOPHAGE) 1000 MG tablet TAKE 1 TABLET TWICE DAILY (Patient taking differently: Take 1,000 mg by mouth in the morning and at bedtime.)    potassium chloride (KLOR-CON) 10 MEQ tablet TAKE 2 TABLETS (20 MEQ TOTAL) BY MOUTH 3 TIMES DAILY. TAKE WITH LASIX    pramipexole (MIRAPEX) 1 MG tablet Take 1 tablet (1 mg total) by mouth 2 (two) times daily.    triamcinolone (KENALOG) 0.1 % Apply to red areas on legs twice a day as needed (Patient taking differently: Apply 1 application topically 2 (two) times daily as needed (redness). Apply to red areas on legs)    No facility-administered encounter medications  on file as of 11/28/2020.    Patient Active Problem List   Diagnosis Date Noted   Persistent atrial fibrillation ([place]) 09/26/2020   Educated about COVID-19 virus infection 11/30/2019   Chronic respiratory failure with hypoxia and hypercapnia ([place]) 07/11/2019   Acute on chronic diastolic CHF (congestive heart failure) ([place]) 07/08/2019   Hypoxia 07/07/2019   Hypokalemia 05/03/2019   Facial asymmetry, acquired 08/20/2016   Fracture of knee prosthesis ([place]) 08/01/2016   Obesity 02/20/2016   Chronic pain syndrome 02/19/2016   Mild episode of  recurrent major depressive disorder ([place]) 08/02/2015   Chronic diastolic congestive heart failure ([place]) 07/16/2015   Type 2 diabetes mellitus with diabetic neuropathy ([place]) 09/27/2014   Hearing loss - has hearing aides, follow by audiologist 10/11/2013   No blood products ([place])  09/11/2013   Disorder of bone and cartilage 06/10/2010   Breast cancer of upper-outer quadrant of right female breast ([place]) 04/04/2010   RESTLESS LEGS SYNDROME 05/05/2007   Obstructive sleep apnea 03/01/2007   Essential hypertension 01/27/2007   Osteoarthritis 01/27/2007    Conditions to be addressed/monitored:CHF and DMII  Care Plan : Heart Failure (Adult)  Updates made by Leona Singleton, RN since 11/28/2020 12:00 AM     Problem: Symptom Exacerbation (Heart Failure)   Priority: Medium     Long-Range Goal: Symptom Exacerbation Prevented or Minimized   Start Date: 10/10/2020  Expected End Date: 04/15/2021  This Visit's Progress: Not on track  Recent Progress: Not on track  Priority: Medium  Note:   Current Barriers:  Knowledge deficit related to basic heart failure pathophysiology and self care management as evidenced by patient not able to verbalize when she needs to contact provider.  Does report weighing daily with ranges of 189-193 pounds.  Patient reporting she had fall 2 days ago while standing behind the recliner.  States she may have hit her head on the door knob.  Patient is alert and oriented but is having a hard time hearing and understanding over telephone.  RNCM attempted to outreach to patient's daughter [name] without success. Case Manager Clinical Goal(s):  patient will weigh self daily and record patient will verbalize understanding of Heart Failure Action Plan and when to call doctor patient will take all Heart Failure mediations as prescribed patient will weigh daily and record (notifying MD of 3 lb weight gain over night or 5 lb in a week) Interventions:  Collaboration with  Allwardt, Randa Evens, PA-C regarding development and update of comprehensive plan of care as evidenced by provider attestation and co-signature Inter-disciplinary care team collaboration (see longitudinal plan of care) Basic overview and discussion of pathophysiology of Heart Failure reviewed and encouraged to follow rescue plan if symptoms flare-up Provided verbal education on low sodium diet Reviewed Heart Failure Action Plan in depth  Discussed importance of daily weight and advised patient to weigh and record daily Reviewed role of diuretics in prevention of fluid overload and management of heart failure Barriers to lifestyle changes reviewed and addressed Barriers to treatment reviewed and addressed and healthy lifestyle promoted Self-awareness of signs/symptoms of worsening disease encouraged Attends all scheduled provider appointments Encouraged to eat more whole grains, fruits and vegetables, lean meats and healthy fats Discussed know when to call the doctor Assessed understanding of adherence and barriers to treatment plan, as well as lifestyle changes; develop strategies to address barriers Encouraged patient to review home care personal services list and to consider private paying for assistance at home Discussed with patient home health  versus personal care services and home maid assistance; encouraged patient/daughter to consider private pay personal aide Reviewed conditions latest fall with patient, fall precautions and preventions reviewed and discussed, Encouraged use of walker with all ambulation; discussed standing in front of recliner, if needing to stand for periods of time due to back pain Encouraged patient to discuss fall with daughter RNCM contacted [facility] and confirmed home health orders and when patient would be evaluated; per agency home therapist to see patient 7/15 and nursing to see patient sometime this weekend Patient Goals/Self-Care Activities: Call  office if I gain more than 2 pounds in one day or 5 pounds in one week Keep legs up while sitting Weigh myself daily and track weight in diary Use salt in moderation Watch for swelling in feet, ankles and legs every day Fall precautions and prevention; use walker with all ambulation Follow Up Plan: The care management team will reach out to the patient again over the next 21business days.       Care Plan : Diabetes Type 2 (Adult)  Updates made by Leona Singleton, RN since 11/28/2020 12:00 AM     Problem: Glycemic Management (Diabetes, Type 2)   Priority: Medium     Long-Range Goal: Patient will maintain Hgb A1c of below 7 within the next 90 days   Start Date: 10/10/2020  Expected End Date: 04/15/2021  This Visit's Progress: Not on track  Recent Progress: Not on track  Priority: Medium  Note:   Objective:  Lab Results  Component Value Date   HGBA1C 6.9 (H) 06/11/2020  Current Barriers:  Knowledge Deficits related to basic Diabetes pathophysiology and self care/management Reports she does not monitor her blood sugars at home.  Latest Hgb A1C 6.9.  States she is compliant with her medications.  Patient stating she did find her home CBG meter, but it is old and strips do not work.  Still not checking home blood sugars.   Verbalizes she still needs new CBG meter and supplies Case Manager Clinical Goal(s):  patient will demonstrate improved adherence to prescribed treatment plan for diabetes self care/management as evidenced by: adherence to ADA/ carb modified diet adherence to prescribed medication regimen Interventions:  Collaboration with Allwardt, Randa Evens, PA-C regarding development and update of comprehensive plan of care as evidenced by provider attestation and co-signature Inter-disciplinary care team collaboration (see longitudinal plan of care) Provided education to patient about basic DM disease process Reviewed medications with patient and discussed importance of medication  adherence Discussed plans with patient for ongoing care management follow up and provided patient with direct contact information for care management team Provided patient with educational materials related to hypo and hyperglycemia and importance of correct treatment Barriers to adherence to treatment plan identified Blood glucose monitoring encouraged Self-awareness of signs/symptoms of hypo or hyperglycemia encouraged Encouraged to attend all scheduled provider appointments Reviewed mutually-set A1C goal Sent Living Well with Diabetes Educational Packet and encouraged patient to review Confirmed patient still has [facility] number [phone]) Encouraged and instructed patient to check blood sugars at least twice a day while on insulin injections Encouraged to continue to work with CCM Pharmacist to help with possible pill packs for patient and possible medication compliance RNCM contacted [facility] and confirmed home health orders and when patient would be evaluated; per agency home therapist to see patient 7/15 and nursing to see patient sometime this weekend [facility] requested home health agency nursing verify CBG meter in the home and patient  able to use or if needing new meter Patient Goals/Self-Care Activities: Set target A1C 7 (current 6.9) Review Diabetes education mailed to you Request new meter prescription from PCP if home meter not working Discuss pill pack with CCM Pharmacist Work with home health nursing and therapist Follow Up Plan: The care management team will reach out to the patient again over the next 21 business days.        Plan:The care management team will reach out to the patient again over the next 21 business days.  Hubert Azure RN, MSN RN Care Management Coordinator  [facility], [place] [phone] [email or web address]

## 2020-11-29 DIAGNOSIS — H9193 Unspecified hearing loss, bilateral: Secondary | ICD-10-CM | POA: Diagnosis not present

## 2020-11-29 DIAGNOSIS — F419 Anxiety disorder, unspecified: Secondary | ICD-10-CM | POA: Diagnosis not present

## 2020-11-29 DIAGNOSIS — G4730 Sleep apnea, unspecified: Secondary | ICD-10-CM | POA: Diagnosis not present

## 2020-11-29 DIAGNOSIS — F329 Major depressive disorder, single episode, unspecified: Secondary | ICD-10-CM | POA: Diagnosis not present

## 2020-11-29 DIAGNOSIS — I110 Hypertensive heart disease with heart failure: Secondary | ICD-10-CM | POA: Diagnosis not present

## 2020-11-29 DIAGNOSIS — E119 Type 2 diabetes mellitus without complications: Secondary | ICD-10-CM | POA: Diagnosis not present

## 2020-11-29 DIAGNOSIS — G5602 Carpal tunnel syndrome, left upper limb: Secondary | ICD-10-CM | POA: Diagnosis not present

## 2020-11-29 DIAGNOSIS — M159 Polyosteoarthritis, unspecified: Secondary | ICD-10-CM | POA: Diagnosis not present

## 2020-11-29 DIAGNOSIS — I5030 Unspecified diastolic (congestive) heart failure: Secondary | ICD-10-CM | POA: Diagnosis not present

## 2020-11-30 DIAGNOSIS — F419 Anxiety disorder, unspecified: Secondary | ICD-10-CM | POA: Diagnosis not present

## 2020-11-30 DIAGNOSIS — I5030 Unspecified diastolic (congestive) heart failure: Secondary | ICD-10-CM | POA: Diagnosis not present

## 2020-11-30 DIAGNOSIS — G5602 Carpal tunnel syndrome, left upper limb: Secondary | ICD-10-CM | POA: Diagnosis not present

## 2020-11-30 DIAGNOSIS — G4730 Sleep apnea, unspecified: Secondary | ICD-10-CM | POA: Diagnosis not present

## 2020-11-30 DIAGNOSIS — E119 Type 2 diabetes mellitus without complications: Secondary | ICD-10-CM | POA: Diagnosis not present

## 2020-11-30 DIAGNOSIS — M159 Polyosteoarthritis, unspecified: Secondary | ICD-10-CM | POA: Diagnosis not present

## 2020-11-30 DIAGNOSIS — F329 Major depressive disorder, single episode, unspecified: Secondary | ICD-10-CM | POA: Diagnosis not present

## 2020-11-30 DIAGNOSIS — I110 Hypertensive heart disease with heart failure: Secondary | ICD-10-CM | POA: Diagnosis not present

## 2020-11-30 DIAGNOSIS — H9193 Unspecified hearing loss, bilateral: Secondary | ICD-10-CM | POA: Diagnosis not present

## 2020-12-02 ENCOUNTER — Other Ambulatory Visit: Payer: Self-pay | Admitting: Family

## 2020-12-02 DIAGNOSIS — G5602 Carpal tunnel syndrome, left upper limb: Secondary | ICD-10-CM | POA: Diagnosis not present

## 2020-12-02 DIAGNOSIS — M159 Polyosteoarthritis, unspecified: Secondary | ICD-10-CM | POA: Diagnosis not present

## 2020-12-02 DIAGNOSIS — F419 Anxiety disorder, unspecified: Secondary | ICD-10-CM | POA: Diagnosis not present

## 2020-12-02 DIAGNOSIS — G4730 Sleep apnea, unspecified: Secondary | ICD-10-CM | POA: Diagnosis not present

## 2020-12-02 DIAGNOSIS — I110 Hypertensive heart disease with heart failure: Secondary | ICD-10-CM | POA: Diagnosis not present

## 2020-12-02 DIAGNOSIS — F329 Major depressive disorder, single episode, unspecified: Secondary | ICD-10-CM | POA: Diagnosis not present

## 2020-12-02 DIAGNOSIS — Z794 Long term (current) use of insulin: Secondary | ICD-10-CM

## 2020-12-02 DIAGNOSIS — H9193 Unspecified hearing loss, bilateral: Secondary | ICD-10-CM | POA: Diagnosis not present

## 2020-12-02 DIAGNOSIS — I5030 Unspecified diastolic (congestive) heart failure: Secondary | ICD-10-CM | POA: Diagnosis not present

## 2020-12-02 DIAGNOSIS — E1140 Type 2 diabetes mellitus with diabetic neuropathy, unspecified: Secondary | ICD-10-CM

## 2020-12-02 DIAGNOSIS — E119 Type 2 diabetes mellitus without complications: Secondary | ICD-10-CM | POA: Diagnosis not present

## 2020-12-02 MED ORDER — ASPIRIN EC 81 MG PO TBEC: 81.0000 mg | DELAYED_RELEASE_TABLET | Freq: Every day | ORAL | 3 refills | Status: DC

## 2020-12-03 DIAGNOSIS — I5030 Unspecified diastolic (congestive) heart failure: Secondary | ICD-10-CM | POA: Diagnosis not present

## 2020-12-03 DIAGNOSIS — E119 Type 2 diabetes mellitus without complications: Secondary | ICD-10-CM | POA: Diagnosis not present

## 2020-12-03 DIAGNOSIS — H9193 Unspecified hearing loss, bilateral: Secondary | ICD-10-CM | POA: Diagnosis not present

## 2020-12-03 DIAGNOSIS — G5602 Carpal tunnel syndrome, left upper limb: Secondary | ICD-10-CM | POA: Diagnosis not present

## 2020-12-03 DIAGNOSIS — G4730 Sleep apnea, unspecified: Secondary | ICD-10-CM | POA: Diagnosis not present

## 2020-12-03 DIAGNOSIS — F419 Anxiety disorder, unspecified: Secondary | ICD-10-CM | POA: Diagnosis not present

## 2020-12-03 DIAGNOSIS — M159 Polyosteoarthritis, unspecified: Secondary | ICD-10-CM | POA: Diagnosis not present

## 2020-12-03 DIAGNOSIS — F329 Major depressive disorder, single episode, unspecified: Secondary | ICD-10-CM | POA: Diagnosis not present

## 2020-12-03 DIAGNOSIS — I110 Hypertensive heart disease with heart failure: Secondary | ICD-10-CM | POA: Diagnosis not present

## 2020-12-04 ENCOUNTER — Other Ambulatory Visit: Payer: Self-pay

## 2020-12-04 ENCOUNTER — Telehealth: Payer: Self-pay

## 2020-12-04 MED ORDER — TRIAMCINOLONE ACETONIDE 0.1 % EX CREA: 1.0000 "application " | TOPICAL_CREAM | Freq: Two times a day (BID) | CUTANEOUS | 1 refills | Status: DC | PRN

## 2020-12-04 NOTE — Telephone Encounter (Signed)
Rx sent in

## 2020-12-04 NOTE — Telephone Encounter (Signed)
  LAST APPOINTMENT DATE:  11/20/20  NEXT APPOINTMENT DATE:@7 /28/2022  MEDICATION: triamcinolone (KENALOG) 0.1 %  PHARMACY:[id number] - [place], [place] - [address]

## 2020-12-06 DIAGNOSIS — E119 Type 2 diabetes mellitus without complications: Secondary | ICD-10-CM | POA: Diagnosis not present

## 2020-12-06 DIAGNOSIS — M159 Polyosteoarthritis, unspecified: Secondary | ICD-10-CM | POA: Diagnosis not present

## 2020-12-06 DIAGNOSIS — I110 Hypertensive heart disease with heart failure: Secondary | ICD-10-CM | POA: Diagnosis not present

## 2020-12-06 DIAGNOSIS — H9193 Unspecified hearing loss, bilateral: Secondary | ICD-10-CM | POA: Diagnosis not present

## 2020-12-06 DIAGNOSIS — G4730 Sleep apnea, unspecified: Secondary | ICD-10-CM | POA: Diagnosis not present

## 2020-12-06 DIAGNOSIS — I5030 Unspecified diastolic (congestive) heart failure: Secondary | ICD-10-CM | POA: Diagnosis not present

## 2020-12-06 DIAGNOSIS — G5602 Carpal tunnel syndrome, left upper limb: Secondary | ICD-10-CM | POA: Diagnosis not present

## 2020-12-06 DIAGNOSIS — F419 Anxiety disorder, unspecified: Secondary | ICD-10-CM | POA: Diagnosis not present

## 2020-12-06 DIAGNOSIS — F329 Major depressive disorder, single episode, unspecified: Secondary | ICD-10-CM | POA: Diagnosis not present

## 2020-12-10 DIAGNOSIS — F419 Anxiety disorder, unspecified: Secondary | ICD-10-CM | POA: Diagnosis not present

## 2020-12-10 DIAGNOSIS — I5030 Unspecified diastolic (congestive) heart failure: Secondary | ICD-10-CM | POA: Diagnosis not present

## 2020-12-10 DIAGNOSIS — F329 Major depressive disorder, single episode, unspecified: Secondary | ICD-10-CM | POA: Diagnosis not present

## 2020-12-10 DIAGNOSIS — G5602 Carpal tunnel syndrome, left upper limb: Secondary | ICD-10-CM | POA: Diagnosis not present

## 2020-12-10 DIAGNOSIS — H9193 Unspecified hearing loss, bilateral: Secondary | ICD-10-CM | POA: Diagnosis not present

## 2020-12-10 DIAGNOSIS — M159 Polyosteoarthritis, unspecified: Secondary | ICD-10-CM | POA: Diagnosis not present

## 2020-12-10 DIAGNOSIS — G4730 Sleep apnea, unspecified: Secondary | ICD-10-CM | POA: Diagnosis not present

## 2020-12-10 DIAGNOSIS — E119 Type 2 diabetes mellitus without complications: Secondary | ICD-10-CM | POA: Diagnosis not present

## 2020-12-10 DIAGNOSIS — I110 Hypertensive heart disease with heart failure: Secondary | ICD-10-CM | POA: Diagnosis not present

## 2020-12-12 ENCOUNTER — Ambulatory Visit: Payer: Medicare HMO | Admitting: *Deleted

## 2020-12-12 DIAGNOSIS — I5030 Unspecified diastolic (congestive) heart failure: Secondary | ICD-10-CM | POA: Diagnosis not present

## 2020-12-12 DIAGNOSIS — Z794 Long term (current) use of insulin: Secondary | ICD-10-CM

## 2020-12-12 DIAGNOSIS — I110 Hypertensive heart disease with heart failure: Secondary | ICD-10-CM | POA: Diagnosis not present

## 2020-12-12 DIAGNOSIS — I5032 Chronic diastolic (congestive) heart failure: Secondary | ICD-10-CM

## 2020-12-12 DIAGNOSIS — F329 Major depressive disorder, single episode, unspecified: Secondary | ICD-10-CM | POA: Diagnosis not present

## 2020-12-12 DIAGNOSIS — G4730 Sleep apnea, unspecified: Secondary | ICD-10-CM | POA: Diagnosis not present

## 2020-12-12 DIAGNOSIS — G5602 Carpal tunnel syndrome, left upper limb: Secondary | ICD-10-CM | POA: Diagnosis not present

## 2020-12-12 DIAGNOSIS — E119 Type 2 diabetes mellitus without complications: Secondary | ICD-10-CM | POA: Diagnosis not present

## 2020-12-12 DIAGNOSIS — E1140 Type 2 diabetes mellitus with diabetic neuropathy, unspecified: Secondary | ICD-10-CM

## 2020-12-12 DIAGNOSIS — H9193 Unspecified hearing loss, bilateral: Secondary | ICD-10-CM | POA: Diagnosis not present

## 2020-12-12 DIAGNOSIS — M159 Polyosteoarthritis, unspecified: Secondary | ICD-10-CM | POA: Diagnosis not present

## 2020-12-12 DIAGNOSIS — F419 Anxiety disorder, unspecified: Secondary | ICD-10-CM | POA: Diagnosis not present

## 2020-12-12 NOTE — Chronic Care Management (AMB) (Signed)
Chronic Care Management   CCM RN Visit Note  12/12/2020 Name: [name] MRN: [id number] DOB: [birth year]  Subjective: [name] is a 80 y.o. year old female who is a primary care patient of Allwardt, Alyssa M, PA-C. The care management team was consulted for assistance with disease management and care coordination needs.    Engaged with patient by telephone for follow up visit in response to provider referral for case management and/or care coordination services.   Consent to Services:  The patient was given information about Chronic Care Management services, agreed to services, and gave verbal consent prior to initiation of services.  Please see initial visit note for detailed documentation.   Patient agreed to services and verbal consent obtained.   Assessment: Review of patient past medical history, allergies, medications, health status, including review of consultants reports, laboratory and other test data, was performed as part of comprehensive evaluation and provision of chronic care management services.   SDOH (Social Determinants of Health) assessments and interventions performed:    CCM Care Plan  Allergies  Allergen Reactions   Sulfa Antibiotics Swelling     Facial Swelling "swelling lips"    Sulfonamide Derivatives Swelling    Lips Swelling   Other     BLOOD PRODUCT REFUSAL    Outpatient Encounter Medications as of 12/12/2020  Medication Sig Note   LANTUS SOLOSTAR 100 UNIT/ML Solostar Pen INJECT 25 UNITS INTO THE SKIN AT BEDTIME.    Ascorbic Acid (VITAMIN C PO) Take 2 tablets by mouth daily.    aspirin EC 81 MG tablet Take 1 tablet (81 mg total) by mouth daily. Swallow whole.    blood glucose meter kit and supplies Dispense based on patient and insurance preference. Use up to two  times daily as directed. (FOR ICD-10 E10.9, E11.9).    Calcium Citrate-Vitamin D (CALCIUM + D PO) Take 2 tablets by mouth daily.    carvedilol (COREG) 3.125 MG tablet Take 1 tablet  (3.125 mg total) by mouth 2 (two) times daily with a meal.    cephALEXin (KEFLEX) 500 MG capsule Take 1 capsule (500 mg total) by mouth 3 (three) times daily. 10/29/2020: States she completed   Cholecalciferol (VITAMIN D3) 50 MCG (2000 UT) CHEW Chew 4,000 Units by mouth daily.    Cyanocobalamin (B-12 PO) Take 2 tablets by mouth daily.    furosemide (LASIX) 80 MG tablet Take 1 tablet (80 mg total) by mouth 3 (three) times daily.    HYDROcodone-acetaminophen (NORCO/VICODIN) 5-325 MG tablet Take 1 tablet by mouth 2 (two) times daily as needed for moderate pain.    Insulin Pen Needle (DROPLET PEN NEEDLES) 32G X 4 MM MISC USE AS DIRECTED ONE TIME DAILY    lisinopril (ZESTRIL) 20 MG tablet Take 1 tablet (20 mg total) by mouth daily.    metFORMIN (GLUCOPHAGE) 1000 MG tablet TAKE 1 TABLET TWICE DAILY (Patient taking differently: Take 1,000 mg by mouth in the morning and at bedtime.)    potassium chloride (KLOR-CON) 10 MEQ tablet TAKE 2 TABLETS (20 MEQ TOTAL) BY MOUTH 3 TIMES DAILY. TAKE WITH LASIX    pramipexole (MIRAPEX) 1 MG tablet Take 1 tablet (1 mg total) by mouth 2 (two) times daily.    triamcinolone cream (KENALOG) 0.1 % Apply 1 application topically 2 (two) times daily as needed (redness). Apply to red areas on legs    No facility-administered encounter medications on file as of 12/12/2020.    Patient Active Problem List   Diagnosis  Date Noted   Persistent atrial fibrillation ([place]) 09/26/2020   Educated about COVID-19 virus infection 11/30/2019   Chronic respiratory failure with hypoxia and hypercapnia ([place]) 07/11/2019   Acute on chronic diastolic CHF (congestive heart failure) ([place]) 07/08/2019   Hypoxia 07/07/2019   Hypokalemia 05/03/2019   Facial asymmetry, acquired 08/20/2016   Fracture of knee prosthesis ([place]) 08/01/2016   Obesity 02/20/2016   Chronic pain syndrome 02/19/2016   Mild episode of recurrent major depressive disorder ([place]) 08/02/2015   Chronic diastolic congestive heart  failure ([place]) 07/16/2015   Type 2 diabetes mellitus with diabetic neuropathy ([place]) 09/27/2014   Hearing loss - has hearing aides, follow by audiologist 10/11/2013   No blood products ([place])  09/11/2013   Disorder of bone and cartilage 06/10/2010   Breast cancer of upper-outer quadrant of right female breast ([place]) 04/04/2010   RESTLESS LEGS SYNDROME 05/05/2007   Obstructive sleep apnea 03/01/2007   Essential hypertension 01/27/2007   Osteoarthritis 01/27/2007    Conditions to be addressed/monitored:CHF and DMII  Care Plan : Heart Failure (Adult)  Updates made by Leona Singleton, RN since 12/12/2020 12:00 AM     Problem: Symptom Exacerbation (Heart Failure)   Priority: Medium     Long-Range Goal: Symptom Exacerbation Prevented or Minimized   Start Date: 10/10/2020  Expected End Date: 05/16/2021  This Visit's Progress: On track  Recent Progress: Not on track  Priority: Medium  Note:   Current Barriers:  Knowledge deficit related to basic heart failure pathophysiology and self care management as evidenced by patient not able to verbalize when she needs to contact provider.  Does report weighing daily with ranges of (193 pounds this morning) 189-193 pounds.  Denies any lower extremity swelling or increase in shortness of breath.  Patient reporting she continues to fall, but unsure of her last fall but says it has not been in the last 2 days.  Spoke with husband and he is unsure of last fall also.  RNCM attempted to contact home health staff without success, message left for nurse. Case Manager Clinical Goal(s):  patient will weigh self daily and record patient will verbalize understanding of Heart Failure Action Plan and when to call doctor patient will take all Heart Failure mediations as prescribed patient will weigh daily and record (notifying MD of 3 lb weight gain over night or 5 lb in a week) Interventions:  Collaboration with Allwardt, Randa Evens, PA-C regarding  development and update of comprehensive plan of care as evidenced by provider attestation and co-signature Inter-disciplinary care team collaboration (see longitudinal plan of care) Basic overview and discussion of pathophysiology of Heart Failure reviewed and encouraged to follow rescue plan if symptoms flare-up Provided verbal education on low sodium diet Reviewed Heart Failure Action Plan in depth  Discussed importance of daily weight and advised patient to weigh and record daily Reviewed role of diuretics in prevention of fluid overload and management of heart failure Barriers to lifestyle changes reviewed and addressed Barriers to treatment reviewed and addressed and healthy lifestyle promoted Self-awareness of signs/symptoms of worsening disease encouraged Attends all scheduled provider appointments Encouraged to eat more whole grains, fruits and vegetables, lean meats and healthy fats Discussed know when to call the doctor Assessed understanding of adherence and barriers to treatment plan, as well as lifestyle changes; develop strategies to address barriers Encouraged patient to review home care personal services list and to consider private paying for assistance at home Discussed with patient home health versus personal care services  and home maid assistance; encouraged patient/daughter to consider private pay personal aide Reviewed conditions latest fall with patient, fall precautions and preventions reviewed and discussed, Encouraged use of walker with all ambulation; discussed standing in front of recliner, if needing to stand for periods of time due to back pain RNCM contacted [facility] and left message for nurse [facility] Patient Goals/Self-Care Activities: Call office if I gain more than 2 pounds in one day or 5 pounds in one week Keep legs up while sitting Weigh myself daily and track weight in diary Use salt in moderation Watch for swelling in feet, ankles and legs  every day Fall precautions and prevention; use walker with all ambulation Follow Up Plan: The care management team will reach out to the patient again over the next 21business days.       Care Plan : Diabetes Type 2 (Adult)  Updates made by Leona Singleton, RN since 12/12/2020 12:00 AM     Problem: Glycemic Management (Diabetes, Type 2)   Priority: Medium     Long-Range Goal: Patient will maintain Hgb A1c of below 7 within the next 90 days   Start Date: 10/10/2020  Expected End Date: 05/16/2021  This Visit's Progress: Not on track  Recent Progress: Not on track  Priority: Medium  Note:   Objective:  Lab Results  Component Value Date   HGBA1C 6.9 (H) 06/11/2020  Current Barriers:  Knowledge Deficits related to basic Diabetes pathophysiology and self care/management Reports she does not monitor her blood sugars at home.  Latest Hgb A1C 6.9.  States she is compliant with her medications.  Patient stating she has checked her blood sugars for the past 2 days.  Blood sugar this morning was 120 (stating non fasting as she has been up most of the night and did have some snacks); RNCM unable to verify if meter is new or old as patient not able to answer/understand the question Case Manager Clinical Goal(s):  patient will demonstrate improved adherence to prescribed treatment plan for diabetes self care/management as evidenced by: adherence to ADA/ carb modified diet adherence to prescribed medication regimen Interventions:  Collaboration with Allwardt, Randa Evens, PA-C regarding development and update of comprehensive plan of care as evidenced by provider attestation and co-signature Inter-disciplinary care team collaboration (see longitudinal plan of care) Provided education to patient about basic DM disease process Reviewed medications with patient and discussed importance of medication adherence Discussed plans with patient for ongoing care management follow up and provided patient with  direct contact information for care management team Provided patient with educational materials related to hypo and hyperglycemia and importance of correct treatment Barriers to adherence to treatment plan identified Blood glucose monitoring encouraged Self-awareness of signs/symptoms of hypo or hyperglycemia encouraged Encouraged to attend all scheduled provider appointments Reviewed mutually-set A1C goal Sent Living Well with Diabetes Educational Packet and encouraged patient to review Confirmed patient still has [place] number [phone]) Encouraged and instructed patient to check blood sugars at least twice a day while on insulin injections Encouraged to continue to work with CCM Pharmacist to help with possible pill packs for patient and possible medication compliance RNCM contacted [place] [facility] and left message for home health nurse Estill Bamberg RNCM previously requested home health agency nursing verify CBG meter in the home and patient able to use or if needing new meter Patient Goals/Self-Care Activities: Set target A1C 7 (current 6.9) Review Diabetes education mailed to you Request new meter prescription from PCP if home  meter not working Check blood sugars 2 times a day and document in log Discuss pill pack with CCM Pharmacist Work with home health nursing and therapist Follow Up Plan: The care management team will reach out to the patient again over the next 21 business days.        Plan:The care management team will reach out to the patient again over the next 21 business days.  Hubert Azure RN, MSN RN Care Management Coordinator  [place] [phone] [email or web address]

## 2020-12-12 NOTE — Patient Instructions (Signed)
Visit Information  PATIENT GOALS:  Goals Addressed             This Visit's Progress    (RNCM) Set My Target A1C-Diabetes Type 2   Not on track    Timeframe:  Long-Range Goal Priority:  Medium Start Date:   10/10/20                          Expected End Date:  05/16/21                     Follow Up Date 12/24/20    Set target A1C 7 (current 6.9) Review Diabetes education mailed to you Request new meter prescription from PCP if home meter not working Check blood sugars 2 times a day and document in log Discuss pill pack with CCM Pharmacist Work with home health nursing and therapist   Why is this important?   Your target A1C is decided together by you and your doctor.  It is based on several things like your age and other health issues.    Notes:      (RNCM) Track and Manage Fluids and Swelling-Heart Failure   On track    Timeframe:  Long-Range Goal Priority:  Medium Start Date:   10/10/20                          Expected End Date: 05/16/21                    Follow Up Date  12/24/20    Call office if I gain more than 2 pounds in one day or 5 pounds in one week Keep legs up while sitting Weigh myself daily and track weight in diary Use salt in moderation Watch for swelling in feet, ankles and legs every day Fall precautions and prevention; use walker with all ambulation   Why is this important?   It is important to check your weight daily and watch how much salt and liquids you have.  It will help you to manage your heart failure.    Notes:         Patient verbalizes understanding of instructions provided today and agrees to view in [place].   The care management team will reach out to the patient again over the next 21 business days.   Vanessa Azure RN, MSN RN Care Management Coordinator  [place] [phone] [email or web address]

## 2020-12-13 DIAGNOSIS — G4730 Sleep apnea, unspecified: Secondary | ICD-10-CM | POA: Diagnosis not present

## 2020-12-13 DIAGNOSIS — M159 Polyosteoarthritis, unspecified: Secondary | ICD-10-CM | POA: Diagnosis not present

## 2020-12-13 DIAGNOSIS — G5602 Carpal tunnel syndrome, left upper limb: Secondary | ICD-10-CM | POA: Diagnosis not present

## 2020-12-13 DIAGNOSIS — F329 Major depressive disorder, single episode, unspecified: Secondary | ICD-10-CM | POA: Diagnosis not present

## 2020-12-13 DIAGNOSIS — H9193 Unspecified hearing loss, bilateral: Secondary | ICD-10-CM | POA: Diagnosis not present

## 2020-12-13 DIAGNOSIS — E119 Type 2 diabetes mellitus without complications: Secondary | ICD-10-CM | POA: Diagnosis not present

## 2020-12-13 DIAGNOSIS — I110 Hypertensive heart disease with heart failure: Secondary | ICD-10-CM | POA: Diagnosis not present

## 2020-12-13 DIAGNOSIS — I5030 Unspecified diastolic (congestive) heart failure: Secondary | ICD-10-CM | POA: Diagnosis not present

## 2020-12-13 DIAGNOSIS — F419 Anxiety disorder, unspecified: Secondary | ICD-10-CM | POA: Diagnosis not present

## 2020-12-16 ENCOUNTER — Other Ambulatory Visit: Payer: Self-pay | Admitting: Family Medicine

## 2020-12-17 DIAGNOSIS — F329 Major depressive disorder, single episode, unspecified: Secondary | ICD-10-CM | POA: Diagnosis not present

## 2020-12-17 DIAGNOSIS — G4730 Sleep apnea, unspecified: Secondary | ICD-10-CM | POA: Diagnosis not present

## 2020-12-17 DIAGNOSIS — I5030 Unspecified diastolic (congestive) heart failure: Secondary | ICD-10-CM | POA: Diagnosis not present

## 2020-12-17 DIAGNOSIS — I110 Hypertensive heart disease with heart failure: Secondary | ICD-10-CM | POA: Diagnosis not present

## 2020-12-17 DIAGNOSIS — H9193 Unspecified hearing loss, bilateral: Secondary | ICD-10-CM | POA: Diagnosis not present

## 2020-12-17 DIAGNOSIS — G5602 Carpal tunnel syndrome, left upper limb: Secondary | ICD-10-CM | POA: Diagnosis not present

## 2020-12-17 DIAGNOSIS — F419 Anxiety disorder, unspecified: Secondary | ICD-10-CM | POA: Diagnosis not present

## 2020-12-17 DIAGNOSIS — M159 Polyosteoarthritis, unspecified: Secondary | ICD-10-CM | POA: Diagnosis not present

## 2020-12-17 DIAGNOSIS — E119 Type 2 diabetes mellitus without complications: Secondary | ICD-10-CM | POA: Diagnosis not present

## 2020-12-19 DIAGNOSIS — M159 Polyosteoarthritis, unspecified: Secondary | ICD-10-CM | POA: Diagnosis not present

## 2020-12-19 DIAGNOSIS — G4730 Sleep apnea, unspecified: Secondary | ICD-10-CM | POA: Diagnosis not present

## 2020-12-19 DIAGNOSIS — I5030 Unspecified diastolic (congestive) heart failure: Secondary | ICD-10-CM | POA: Diagnosis not present

## 2020-12-19 DIAGNOSIS — H9193 Unspecified hearing loss, bilateral: Secondary | ICD-10-CM | POA: Diagnosis not present

## 2020-12-19 DIAGNOSIS — G5602 Carpal tunnel syndrome, left upper limb: Secondary | ICD-10-CM | POA: Diagnosis not present

## 2020-12-19 DIAGNOSIS — F419 Anxiety disorder, unspecified: Secondary | ICD-10-CM | POA: Diagnosis not present

## 2020-12-19 DIAGNOSIS — F329 Major depressive disorder, single episode, unspecified: Secondary | ICD-10-CM | POA: Diagnosis not present

## 2020-12-19 DIAGNOSIS — E119 Type 2 diabetes mellitus without complications: Secondary | ICD-10-CM | POA: Diagnosis not present

## 2020-12-19 DIAGNOSIS — I110 Hypertensive heart disease with heart failure: Secondary | ICD-10-CM | POA: Diagnosis not present

## 2020-12-20 ENCOUNTER — Other Ambulatory Visit: Payer: Self-pay | Admitting: Physician Assistant

## 2020-12-20 DIAGNOSIS — R296 Repeated falls: Secondary | ICD-10-CM

## 2020-12-20 DIAGNOSIS — G479 Sleep disorder, unspecified: Secondary | ICD-10-CM

## 2020-12-20 DIAGNOSIS — Z742 Need for assistance at home and no other household member able to render care: Secondary | ICD-10-CM

## 2020-12-23 DIAGNOSIS — Z853 Personal history of malignant neoplasm of breast: Secondary | ICD-10-CM

## 2020-12-23 DIAGNOSIS — Z9181 History of falling: Secondary | ICD-10-CM

## 2020-12-23 DIAGNOSIS — I5032 Chronic diastolic (congestive) heart failure: Secondary | ICD-10-CM

## 2020-12-23 DIAGNOSIS — I5030 Unspecified diastolic (congestive) heart failure: Secondary | ICD-10-CM | POA: Diagnosis not present

## 2020-12-23 DIAGNOSIS — Z7901 Long term (current) use of anticoagulants: Secondary | ICD-10-CM

## 2020-12-23 DIAGNOSIS — Z96653 Presence of artificial knee joint, bilateral: Secondary | ICD-10-CM

## 2020-12-23 DIAGNOSIS — E119 Type 2 diabetes mellitus without complications: Secondary | ICD-10-CM | POA: Diagnosis not present

## 2020-12-23 DIAGNOSIS — H9193 Unspecified hearing loss, bilateral: Secondary | ICD-10-CM | POA: Diagnosis not present

## 2020-12-23 DIAGNOSIS — G4730 Sleep apnea, unspecified: Secondary | ICD-10-CM | POA: Diagnosis not present

## 2020-12-23 DIAGNOSIS — G2581 Restless legs syndrome: Secondary | ICD-10-CM

## 2020-12-23 DIAGNOSIS — F419 Anxiety disorder, unspecified: Secondary | ICD-10-CM | POA: Diagnosis not present

## 2020-12-23 DIAGNOSIS — I110 Hypertensive heart disease with heart failure: Secondary | ICD-10-CM | POA: Diagnosis not present

## 2020-12-23 DIAGNOSIS — Z794 Long term (current) use of insulin: Secondary | ICD-10-CM

## 2020-12-23 DIAGNOSIS — E669 Obesity, unspecified: Secondary | ICD-10-CM

## 2020-12-23 DIAGNOSIS — F329 Major depressive disorder, single episode, unspecified: Secondary | ICD-10-CM | POA: Diagnosis not present

## 2020-12-23 DIAGNOSIS — M159 Polyosteoarthritis, unspecified: Secondary | ICD-10-CM | POA: Diagnosis not present

## 2020-12-23 DIAGNOSIS — G5602 Carpal tunnel syndrome, left upper limb: Secondary | ICD-10-CM | POA: Diagnosis not present

## 2020-12-23 DIAGNOSIS — Z7984 Long term (current) use of oral hypoglycemic drugs: Secondary | ICD-10-CM

## 2020-12-24 ENCOUNTER — Ambulatory Visit (INDEPENDENT_AMBULATORY_CARE_PROVIDER_SITE_OTHER): Payer: Medicare HMO | Admitting: *Deleted

## 2020-12-24 DIAGNOSIS — I5032 Chronic diastolic (congestive) heart failure: Secondary | ICD-10-CM | POA: Diagnosis not present

## 2020-12-24 DIAGNOSIS — E1140 Type 2 diabetes mellitus with diabetic neuropathy, unspecified: Secondary | ICD-10-CM | POA: Diagnosis not present

## 2020-12-24 DIAGNOSIS — I5033 Acute on chronic diastolic (congestive) heart failure: Secondary | ICD-10-CM | POA: Diagnosis not present

## 2020-12-24 DIAGNOSIS — Z794 Long term (current) use of insulin: Secondary | ICD-10-CM | POA: Diagnosis not present

## 2020-12-24 NOTE — Chronic Care Management (AMB) (Signed)
Chronic Care Management   CCM RN Visit Note  12/24/2020 Name: [name] MRN: [id number] DOB: 1940-11-07  Subjective: [name] is a 80 y.o. year old female who is a primary care patient of Allwardt, Alyssa M, PA-C. The care management team was consulted for assistance with disease management and care coordination needs.    Engaged with patient by telephone for follow up visit in response to provider referral for case management and/or care coordination services.   Consent to Services:  The patient was given information about Chronic Care Management services, agreed to services, and gave verbal consent prior to initiation of services.  Please see initial visit note for detailed documentation.   Patient agreed to services and verbal consent obtained.   Assessment: Review of patient past medical history, allergies, medications, health status, including review of consultants reports, laboratory and other test data, was performed as part of comprehensive evaluation and provision of chronic care management services.   SDOH (Social Determinants of Health) assessments and interventions performed:    CCM Care Plan  Allergies  Allergen Reactions   Sulfa Antibiotics Swelling     Facial Swelling "swelling lips"    Sulfonamide Derivatives Swelling    Lips Swelling   Other     BLOOD PRODUCT REFUSAL    Outpatient Encounter Medications as of 12/24/2020  Medication Sig Note   Ascorbic Acid (VITAMIN C PO) Take 2 tablets by mouth daily.    aspirin EC 81 MG tablet Take 1 tablet (81 mg total) by mouth daily. Swallow whole.    blood glucose meter kit and supplies Dispense based on patient and insurance preference. Use up to two  times daily as directed. (FOR ICD-10 E10.9, E11.9).    Calcium Citrate-Vitamin D (CALCIUM + D PO) Take 2 tablets by mouth daily.    carvedilol (COREG) 3.125 MG tablet Take 1 tablet (3.125 mg total) by mouth 2 (two) times daily with a meal.    cephALEXin (KEFLEX) 500  MG capsule Take 1 capsule (500 mg total) by mouth 3 (three) times daily. 10/29/2020: States she completed   Cholecalciferol (VITAMIN D3) 50 MCG (2000 UT) CHEW Chew 4,000 Units by mouth daily.    Cyanocobalamin (B-12 PO) Take 2 tablets by mouth daily.    furosemide (LASIX) 80 MG tablet Take 1 tablet (80 mg total) by mouth 3 (three) times daily.    HYDROcodone-acetaminophen (NORCO/VICODIN) 5-325 MG tablet Take 1 tablet by mouth 2 (two) times daily as needed for moderate pain.    Insulin Pen Needle (DROPLET PEN NEEDLES) 32G X 4 MM MISC USE AS DIRECTED ONE TIME DAILY    LANTUS SOLOSTAR 100 UNIT/ML Solostar Pen INJECT 25 UNITS INTO THE SKIN AT BEDTIME.    lisinopril (ZESTRIL) 20 MG tablet Take 1 tablet (20 mg total) by mouth daily.    metFORMIN (GLUCOPHAGE) 1000 MG tablet TAKE 1 TABLET TWICE DAILY (Patient taking differently: Take 1,000 mg by mouth in the morning and at bedtime.)    potassium chloride (KLOR-CON) 10 MEQ tablet TAKE 2 TABLETS THREE TIMES DAILY. TAKE WITH LASIX.    pramipexole (MIRAPEX) 1 MG tablet Take 1 tablet (1 mg total) by mouth 2 (two) times daily.    triamcinolone cream (KENALOG) 0.1 % Apply 1 application topically 2 (two) times daily as needed (redness). Apply to red areas on legs    No facility-administered encounter medications on file as of 12/24/2020.    Patient Active Problem List   Diagnosis Date Noted   Persistent  atrial fibrillation ([place]) 09/26/2020   Educated about COVID-19 virus infection 11/30/2019   Chronic respiratory failure with hypoxia and hypercapnia ([place]) 07/11/2019   Acute on chronic diastolic CHF (congestive heart failure) ([place]) 07/08/2019   Hypoxia 07/07/2019   Hypokalemia 05/03/2019   Facial asymmetry, acquired 08/20/2016   Fracture of knee prosthesis ([place]) 08/01/2016   Obesity 02/20/2016   Chronic pain syndrome 02/19/2016   Mild episode of recurrent major depressive disorder ([place]) 08/02/2015   Chronic diastolic congestive heart failure ([place])  07/16/2015   Type 2 diabetes mellitus with diabetic neuropathy ([place]) 09/27/2014   Hearing loss - has hearing aides, follow by audiologist 10/11/2013   No blood products ([place])  09/11/2013   Disorder of bone and cartilage 06/10/2010   Breast cancer of upper-outer quadrant of right female breast ([place]) 04/04/2010   RESTLESS LEGS SYNDROME 05/05/2007   Obstructive sleep apnea 03/01/2007   Essential hypertension 01/27/2007   Osteoarthritis 01/27/2007    Conditions to be addressed/monitored:CHF and DMII  Care Plan : Heart Failure (Adult)  Updates made by Leona Singleton, RN since 12/24/2020 12:00 AM     Problem: Symptom Exacerbation (Heart Failure)   Priority: Medium     Long-Range Goal: Symptom Exacerbation Prevented or Minimized   Start Date: 10/10/2020  Expected End Date: 05/16/2021  This Visit's Progress: On track  Recent Progress: On track  Priority: Medium  Note:   Current Barriers:  Knowledge deficit related to basic heart failure pathophysiology and self care management as evidenced by patient not able to verbalize when she needs to contact provider.  Does report weighing daily with ranges of (193 pounds this morning) 189-193 pounds.  Denies any lower extremity swelling or increase in shortness of breath.  Patient reporting she continues to fall, but unsure of her last fall but says it has not been in the last 2 days.  Spoke with husband and he is unsure of last fall also.  RNCM attempted to contact home health staff without success, message left for nurse. Case Manager Clinical Goal(s):  patient will weigh self daily and record patient will verbalize understanding of Heart Failure Action Plan and when to call doctor patient will take all Heart Failure mediations as prescribed patient will weigh daily and record (notifying MD of 3 lb weight gain over night or 5 lb in a week) Interventions:  Collaboration with Allwardt, Randa Evens, PA-C regarding development and update of  comprehensive plan of care as evidenced by provider attestation and co-signature Inter-disciplinary care team collaboration (see longitudinal plan of care) Basic overview and discussion of pathophysiology of Heart Failure reviewed and encouraged to follow rescue plan if symptoms flare-up Provided verbal education on low sodium diet Reviewed Heart Failure Action Plan in depth  Discussed importance of daily weight and advised patient to weigh and record daily Reviewed role of diuretics in prevention of fluid overload and management of heart failure Barriers to lifestyle changes reviewed and addressed Barriers to treatment reviewed and addressed and healthy lifestyle promoted Self-awareness of signs/symptoms of worsening disease encouraged Attends all scheduled provider appointments Encouraged to eat more whole grains, fruits and vegetables, lean meats and healthy fats Discussed know when to call the doctor Assessed understanding of adherence and barriers to treatment plan, as well as lifestyle changes; develop strategies to address barriers Encouraged patient to review home care personal services list and to consider private paying for assistance at home Discussed with patient home health versus personal care services and home maid assistance; encouraged patient/daughter  to consider private pay personal aide Encouraged use of walker with all ambulation; discussed standing in front of recliner, if needing to stand for periods of time due to back pain RNCM contacted [facility] and spoke with nurse Magda Paganini; per Magda Paganini pt. Is doing a little better mentation wise and is able to assist filing home pill pack and checkng blood sugars Encouraged patient to obtain new batteries for home meter Patient Goals/Self-Care Activities: Call office if I gain more than 2 pounds in one day or 5 pounds in one week Keep legs up while sitting Weigh myself daily and track weight in diary Use salt in  moderation Watch for swelling in feet, ankles and legs every day Fall precautions and prevention; use walker with all ambulation Follow Up Plan: The care management team will reach out to the patient again over the next 45 business days.       Care Plan : Diabetes Type 2 (Adult)  Updates made by Leona Singleton, RN since 12/24/2020 12:00 AM     Problem: Glycemic Management (Diabetes, Type 2)   Priority: Medium     Long-Range Goal: Patient will maintain Hgb A1c of below 7 within the next 90 days   Start Date: 10/10/2020  Expected End Date: 05/16/2021  Recent Progress: Not on track  Priority: Medium  Note:   Objective:  Lab Results  Component Value Date   HGBA1C 6.9 (H) 06/11/2020  Current Barriers:  Knowledge Deficits related to basic Diabetes pathophysiology and self care/management Reports she does not monitor her blood sugars at home.  Latest Hgb A1C 6.9.  States she is compliant with her medications.  Patient stating she is unable to check blood sugars because the machine needs batteries.  Patient seems clearer today.  Denies any falls since home healthcare has been coming out.  States she has been using her walker with all ambulation.  Does report she is going to visit daughter in [place]  8/16-8/23. Case Manager Clinical Goal(s):  patient will demonstrate improved adherence to prescribed treatment plan for diabetes self care/management as evidenced by: adherence to ADA/ carb modified diet adherence to prescribed medication regimen Interventions:  Collaboration with Allwardt, Randa Evens, PA-C regarding development and update of comprehensive plan of care as evidenced by provider attestation and co-signature Inter-disciplinary care team collaboration (see longitudinal plan of care) Provided education to patient about basic DM disease process Reviewed medications with patient and discussed importance of medication adherence Discussed plans with patient for ongoing care management  follow up and provided patient with direct contact information for care management team Provided patient with educational materials related to hypo and hyperglycemia and importance of correct treatment Barriers to adherence to treatment plan identified Blood glucose monitoring encouraged Self-awareness of signs/symptoms of hypo or hyperglycemia encouraged Encouraged to attend all scheduled provider appointments Reviewed mutually-set A1C goal [facility] with Diabetes Educational Packet and encouraged patient to review Confirmed patient still has [place] number [phone]) Emotional support and empathy provided to patient Encouraged and instructed patient to check blood sugars at least twice a day while on insulin injections Encouraged to continue to work with CCM Pharmacist to help with possible pill packs for patient and possible medication compliance RNCM contacted [place] and left message for home health nurse Estill Bamberg RNCM previously requested home health agency nursing verify CBG meter in the home and patient able to use or if needing new meter Discussed with patient possible [place] versus ALF (declines at thid tine  Patient Goals/Self-Care Activities: Set target A1C 7 (current 6.9) Review Diabetes education mailed to you Request new meter prescription from PCP if home meter not working Check blood sugars 2 times a day and document in log Discuss pill pack with CCM Pharmacist Work with home health nursing and therapist Follow Up Plan: The care management team will reach out to the patient again over the next 21 business days.        Plan:The care management team will reach out to the patient again over the next 30 business days.  Hubert Azure RN, MSN RN Care Management Coordinator  [place] [phone] [email or web address]

## 2020-12-24 NOTE — Patient Instructions (Signed)
Visit Information  PATIENT GOALS:  Goals Addressed             This Visit's Progress    (RNCM) Set My Target A1C-Diabetes Type 2   Not on track    Timeframe:  Long-Range Goal Priority:  Medium Start Date:   10/10/20                          Expected End Date:  05/16/21                     Follow Up Date 02/14/21    Set target A1C 7 (current 6.9) Review Diabetes education mailed to you Request new meter prescription from PCP if home meter not working Try changing batteries in home meter Check blood sugars 2 times a day and document in log Discuss pill pack with CCM Pharmacist Work with home health nursing and therapist   Why is this important?   Your target A1C is decided together by you and your doctor.  It is based on several things like your age and other health issues.    Notes:      (RNCM) Track and Manage Fluids and Swelling-Heart Failure   Not on track    Timeframe:  Long-Range Goal Priority:  Medium Start Date:   10/10/20                          Expected End Date: 05/16/21                    Follow Up Date  01/14/21    Call office if I gain more than 2 pounds in one day or 5 pounds in one week Keep legs up while sitting Weigh myself daily and track weight in diary Use salt in moderation Watch for swelling in feet, ankles and legs every day Fall precautions and prevention; use walker with all ambulation   Why is this important?   It is important to check your weight daily and watch how much salt and liquids you have.  It will help you to manage your heart failure.    Notes:         Patient verbalizes understanding of instructions provided today and agrees to view in [place].   The care management team will reach out to the patient again over the next 30 business days.   Hubert Azure RN, MSN RN Care Management Coordinator  [facility] [phone] [email or web address]

## 2020-12-25 DIAGNOSIS — M159 Polyosteoarthritis, unspecified: Secondary | ICD-10-CM | POA: Diagnosis not present

## 2020-12-25 DIAGNOSIS — F419 Anxiety disorder, unspecified: Secondary | ICD-10-CM | POA: Diagnosis not present

## 2020-12-25 DIAGNOSIS — H9193 Unspecified hearing loss, bilateral: Secondary | ICD-10-CM | POA: Diagnosis not present

## 2020-12-25 DIAGNOSIS — I5030 Unspecified diastolic (congestive) heart failure: Secondary | ICD-10-CM | POA: Diagnosis not present

## 2020-12-25 DIAGNOSIS — G4730 Sleep apnea, unspecified: Secondary | ICD-10-CM | POA: Diagnosis not present

## 2020-12-25 DIAGNOSIS — F329 Major depressive disorder, single episode, unspecified: Secondary | ICD-10-CM | POA: Diagnosis not present

## 2020-12-25 DIAGNOSIS — E119 Type 2 diabetes mellitus without complications: Secondary | ICD-10-CM | POA: Diagnosis not present

## 2020-12-25 DIAGNOSIS — G5602 Carpal tunnel syndrome, left upper limb: Secondary | ICD-10-CM | POA: Diagnosis not present

## 2020-12-25 DIAGNOSIS — I110 Hypertensive heart disease with heart failure: Secondary | ICD-10-CM | POA: Diagnosis not present

## 2020-12-26 ENCOUNTER — Telehealth: Payer: Self-pay | Admitting: Pharmacist

## 2020-12-26 DIAGNOSIS — G5602 Carpal tunnel syndrome, left upper limb: Secondary | ICD-10-CM | POA: Diagnosis not present

## 2020-12-26 DIAGNOSIS — G4730 Sleep apnea, unspecified: Secondary | ICD-10-CM | POA: Diagnosis not present

## 2020-12-26 DIAGNOSIS — F419 Anxiety disorder, unspecified: Secondary | ICD-10-CM | POA: Diagnosis not present

## 2020-12-26 DIAGNOSIS — M159 Polyosteoarthritis, unspecified: Secondary | ICD-10-CM | POA: Diagnosis not present

## 2020-12-26 DIAGNOSIS — I110 Hypertensive heart disease with heart failure: Secondary | ICD-10-CM | POA: Diagnosis not present

## 2020-12-26 DIAGNOSIS — F329 Major depressive disorder, single episode, unspecified: Secondary | ICD-10-CM | POA: Diagnosis not present

## 2020-12-26 DIAGNOSIS — H9193 Unspecified hearing loss, bilateral: Secondary | ICD-10-CM | POA: Diagnosis not present

## 2020-12-26 DIAGNOSIS — E119 Type 2 diabetes mellitus without complications: Secondary | ICD-10-CM | POA: Diagnosis not present

## 2020-12-26 DIAGNOSIS — I5030 Unspecified diastolic (congestive) heart failure: Secondary | ICD-10-CM | POA: Diagnosis not present

## 2020-12-26 NOTE — Chronic Care Management (AMB) (Addendum)
Chronic Care Management Pharmacy Assistant   Name: [name]  MRN: [id number] DOB: January 08, 1941   Reason for Encounter: General Adherence Call    Recent office visits:  11/20/2020 [facility] (PCP) Allwardt, Randa Evens, PA-C; no medication changes indicated.  Recent consult visits:  None  Hospital visits:  None in previous 6 months  Medications: Outpatient Encounter Medications as of 12/26/2020  Medication Sig Note   Ascorbic Acid (VITAMIN C PO) Take 2 tablets by mouth daily.    aspirin EC 81 MG tablet Take 1 tablet (81 mg total) by mouth daily. Swallow whole.    blood glucose meter kit and supplies Dispense based on patient and insurance preference. Use up to two  times daily as directed. (FOR ICD-10 E10.9, E11.9).    Calcium Citrate-Vitamin D (CALCIUM + D PO) Take 2 tablets by mouth daily.    carvedilol (COREG) 3.125 MG tablet Take 1 tablet (3.125 mg total) by mouth 2 (two) times daily with a meal.    cephALEXin (KEFLEX) 500 MG capsule Take 1 capsule (500 mg total) by mouth 3 (three) times daily. 10/29/2020: States she completed   Cholecalciferol (VITAMIN D3) 50 MCG (2000 UT) CHEW Chew 4,000 Units by mouth daily.    Cyanocobalamin (B-12 PO) Take 2 tablets by mouth daily.    furosemide (LASIX) 80 MG tablet Take 1 tablet (80 mg total) by mouth 3 (three) times daily.    HYDROcodone-acetaminophen (NORCO/VICODIN) 5-325 MG tablet Take 1 tablet by mouth 2 (two) times daily as needed for moderate pain.    Insulin Pen Needle (DROPLET PEN NEEDLES) 32G X 4 MM MISC USE AS DIRECTED ONE TIME DAILY    LANTUS SOLOSTAR 100 UNIT/ML Solostar Pen INJECT 25 UNITS INTO THE SKIN AT BEDTIME.    lisinopril (ZESTRIL) 20 MG tablet Take 1 tablet (20 mg total) by mouth daily.    metFORMIN (GLUCOPHAGE) 1000 MG tablet TAKE 1 TABLET TWICE DAILY (Patient taking differently: Take 1,000 mg by mouth in the morning and at bedtime.)    potassium chloride (KLOR-CON) 10 MEQ tablet TAKE 2 TABLETS THREE TIMES DAILY. TAKE WITH  LASIX.    pramipexole (MIRAPEX) 1 MG tablet Take 1 tablet (1 mg total) by mouth 2 (two) times daily.    triamcinolone cream (KENALOG) 0.1 % Apply 1 application topically 2 (two) times daily as needed (redness). Apply to red areas on legs    No facility-administered encounter medications on file as of 12/26/2020.    Patient Questions: Have you had any problems recently with your health? Patient states she has not had any problems recently with her health.  Have you had any problems with your pharmacy? Patient states she has not had any problems recently with her pharmacy.  What issues or side effects are you having with your medications? Patient denies having any issues or side effects with any of her medications.  What would you like me to pass along to Leata Mouse, CPP for him to help you with?  Patient states she does not have anything for me to pass along.  What can we do to take care of you better? Patient did not have any suggestions.  Future Appointments  Date Time Provider [place]  01/14/2021 11:30 AM [facility]-CC[facility] CARE [facility]  03/03/2021 10:30 AM [facility] CCM PHARMACIST [facility]  10/13/2021  8:45 AM [facility] HEALTH COACH [facility]      Star Rating Drugs: Lisinopril last filled 11/26/2020 90 DS Metformin last filled 09/20/2020 90  DS  April D Calhoun, [place] Pharmacist Assistant [phone]   10 minutes spent in review, coordination, and documentation.  Reviewed by: Beverly Milch, PharmD Clinical Pharmacist [phone]

## 2020-12-29 DIAGNOSIS — E119 Type 2 diabetes mellitus without complications: Secondary | ICD-10-CM | POA: Diagnosis not present

## 2020-12-29 DIAGNOSIS — I5030 Unspecified diastolic (congestive) heart failure: Secondary | ICD-10-CM | POA: Diagnosis not present

## 2020-12-29 DIAGNOSIS — I110 Hypertensive heart disease with heart failure: Secondary | ICD-10-CM | POA: Diagnosis not present

## 2020-12-29 DIAGNOSIS — F419 Anxiety disorder, unspecified: Secondary | ICD-10-CM | POA: Diagnosis not present

## 2020-12-29 DIAGNOSIS — F329 Major depressive disorder, single episode, unspecified: Secondary | ICD-10-CM | POA: Diagnosis not present

## 2020-12-29 DIAGNOSIS — G4730 Sleep apnea, unspecified: Secondary | ICD-10-CM | POA: Diagnosis not present

## 2020-12-29 DIAGNOSIS — G5602 Carpal tunnel syndrome, left upper limb: Secondary | ICD-10-CM | POA: Diagnosis not present

## 2020-12-29 DIAGNOSIS — M159 Polyosteoarthritis, unspecified: Secondary | ICD-10-CM | POA: Diagnosis not present

## 2020-12-29 DIAGNOSIS — H9193 Unspecified hearing loss, bilateral: Secondary | ICD-10-CM | POA: Diagnosis not present

## 2021-01-02 ENCOUNTER — Telehealth: Payer: Self-pay

## 2021-01-02 NOTE — Telephone Encounter (Signed)
MEDICATION: LANTUS SOLOSTAR 100 UNIT/ML Solostar Pen  PHARMACY:  [facility] - [place], [place] Phone:  [phone]  Fax:  [phone]      Comments:   **Let patient know to contact pharmacy at the end of the day to make sure medication is ready. **  ** Please notify patient to allow 48-72 hours to process**  **Encourage patient to contact the pharmacy for refills or they can request refills through [facility]**

## 2021-01-03 ENCOUNTER — Other Ambulatory Visit: Payer: Self-pay

## 2021-01-03 DIAGNOSIS — G4730 Sleep apnea, unspecified: Secondary | ICD-10-CM | POA: Diagnosis not present

## 2021-01-03 DIAGNOSIS — Z794 Long term (current) use of insulin: Secondary | ICD-10-CM

## 2021-01-03 DIAGNOSIS — I5030 Unspecified diastolic (congestive) heart failure: Secondary | ICD-10-CM | POA: Diagnosis not present

## 2021-01-03 DIAGNOSIS — F329 Major depressive disorder, single episode, unspecified: Secondary | ICD-10-CM | POA: Diagnosis not present

## 2021-01-03 DIAGNOSIS — H9193 Unspecified hearing loss, bilateral: Secondary | ICD-10-CM | POA: Diagnosis not present

## 2021-01-03 DIAGNOSIS — M159 Polyosteoarthritis, unspecified: Secondary | ICD-10-CM | POA: Diagnosis not present

## 2021-01-03 DIAGNOSIS — I110 Hypertensive heart disease with heart failure: Secondary | ICD-10-CM | POA: Diagnosis not present

## 2021-01-03 DIAGNOSIS — G5602 Carpal tunnel syndrome, left upper limb: Secondary | ICD-10-CM | POA: Diagnosis not present

## 2021-01-03 DIAGNOSIS — E1140 Type 2 diabetes mellitus with diabetic neuropathy, unspecified: Secondary | ICD-10-CM

## 2021-01-03 DIAGNOSIS — E119 Type 2 diabetes mellitus without complications: Secondary | ICD-10-CM | POA: Diagnosis not present

## 2021-01-03 DIAGNOSIS — F419 Anxiety disorder, unspecified: Secondary | ICD-10-CM | POA: Diagnosis not present

## 2021-01-03 MED ORDER — LANTUS SOLOSTAR 100 UNIT/ML ~~LOC~~ SOPN: 25.0000 [IU] | PEN_INJECTOR | Freq: Every day | SUBCUTANEOUS | 2 refills | Status: DC

## 2021-01-03 NOTE — Telephone Encounter (Signed)
Rx sent in

## 2021-01-09 ENCOUNTER — Telehealth: Payer: Medicare HMO

## 2021-01-10 ENCOUNTER — Telehealth: Payer: Self-pay

## 2021-01-10 NOTE — Telephone Encounter (Signed)
.   Encourage patient to contact the pharmacy for refills or they can request refills through [place]:  Please schedule appointment if longer than 1 year  NEXT APPOINTMENT DATE:  MEDICATION:blood glucose meter kit and supplies Insulin Pen Needle (DROPLET PEN NEEDLES) 32G X 4 MM MISC  Is the patient out of medication?   [place], [place]

## 2021-01-13 ENCOUNTER — Other Ambulatory Visit: Payer: Self-pay

## 2021-01-13 MED ORDER — DROPLET PEN NEEDLES 32G X 4 MM MISC: 6 refills | Status: DC

## 2021-01-13 MED ORDER — BLOOD GLUCOSE METER KIT: PACK | 0 refills | Status: AC

## 2021-01-13 NOTE — Telephone Encounter (Signed)
Scripts sent to [employer] [facility], [place]

## 2021-01-14 ENCOUNTER — Telehealth: Payer: Medicare HMO

## 2021-01-16 ENCOUNTER — Ambulatory Visit (INDEPENDENT_AMBULATORY_CARE_PROVIDER_SITE_OTHER): Payer: Medicare HMO | Admitting: *Deleted

## 2021-01-16 ENCOUNTER — Telehealth: Payer: Self-pay

## 2021-01-16 DIAGNOSIS — G5602 Carpal tunnel syndrome, left upper limb: Secondary | ICD-10-CM | POA: Diagnosis not present

## 2021-01-16 DIAGNOSIS — E119 Type 2 diabetes mellitus without complications: Secondary | ICD-10-CM | POA: Diagnosis not present

## 2021-01-16 DIAGNOSIS — F419 Anxiety disorder, unspecified: Secondary | ICD-10-CM | POA: Diagnosis not present

## 2021-01-16 DIAGNOSIS — I5032 Chronic diastolic (congestive) heart failure: Secondary | ICD-10-CM

## 2021-01-16 DIAGNOSIS — I5030 Unspecified diastolic (congestive) heart failure: Secondary | ICD-10-CM | POA: Diagnosis not present

## 2021-01-16 DIAGNOSIS — G4730 Sleep apnea, unspecified: Secondary | ICD-10-CM | POA: Diagnosis not present

## 2021-01-16 DIAGNOSIS — F329 Major depressive disorder, single episode, unspecified: Secondary | ICD-10-CM | POA: Diagnosis not present

## 2021-01-16 DIAGNOSIS — H9193 Unspecified hearing loss, bilateral: Secondary | ICD-10-CM | POA: Diagnosis not present

## 2021-01-16 DIAGNOSIS — Z794 Long term (current) use of insulin: Secondary | ICD-10-CM

## 2021-01-16 DIAGNOSIS — M159 Polyosteoarthritis, unspecified: Secondary | ICD-10-CM | POA: Diagnosis not present

## 2021-01-16 DIAGNOSIS — E1140 Type 2 diabetes mellitus with diabetic neuropathy, unspecified: Secondary | ICD-10-CM

## 2021-01-16 DIAGNOSIS — I110 Hypertensive heart disease with heart failure: Secondary | ICD-10-CM | POA: Diagnosis not present

## 2021-01-16 NOTE — Chronic Care Management (AMB) (Signed)
  Chronic Care Management   Note  01/16/2021 Name: [name] MRN: [id number] DOB: 10-14-1940  [name] is a 80 y.o. year old female who is a primary care patient of Allwardt, Alyssa M, PA-C. [name] is currently enrolled in care management services. An additional referral for LCSW was placed.   Follow up plan: Unsuccessful telephone outreach attempt made. A HIPAA compliant phone message was left for the patient providing contact information and requesting a return call.  The care management team will reach out to the patient again over the next 5 days.  If patient returns call to provider office, please advise to call [facility]  at [place], [place], [place], [place] [address] Direct Dial: [phone] [email or web address] Website: [email or web address]

## 2021-01-16 NOTE — Chronic Care Management (AMB) (Signed)
Chronic Care Management   CCM RN Visit Note  01/16/2021 Name: [name] MRN: [id number] DOB: Jul 15, 1940  Subjective: [name] is a 80 y.o. year old female who is a primary care patient of Allwardt, Alyssa M, PA-C. The care management team was consulted for assistance with disease management and care coordination needs.    Engaged with patient by telephone for follow up visit in response to provider referral for case management and/or care coordination services.   Consent to Services:  The patient was given information about Chronic Care Management services, agreed to services, and gave verbal consent prior to initiation of services.  Please see initial visit note for detailed documentation.   Patient agreed to services and verbal consent obtained.   Assessment: Review of patient past medical history, allergies, medications, health status, including review of consultants reports, laboratory and other test data, was performed as part of comprehensive evaluation and provision of chronic care management services.   SDOH (Social Determinants of Health) assessments and interventions performed:    CCM Care Plan  Allergies  Allergen Reactions   Sulfa Antibiotics Swelling     Facial Swelling "swelling lips"    Sulfonamide Derivatives Swelling    Lips Swelling   Other     BLOOD PRODUCT REFUSAL    Outpatient Encounter Medications as of 01/16/2021  Medication Sig Note   Ascorbic Acid (VITAMIN C PO) Take 2 tablets by mouth daily.    aspirin EC 81 MG tablet Take 1 tablet (81 mg total) by mouth daily. Swallow whole.    blood glucose meter kit and supplies Dispense based on patient and insurance preference. Use up to two  times daily as directed. (FOR ICD-10 E10.9, E11.9).    Calcium Citrate-Vitamin D (CALCIUM + D PO) Take 2 tablets by mouth daily.    carvedilol (COREG) 3.125 MG tablet Take 1 tablet (3.125 mg total) by mouth 2 (two) times daily with a meal.    cephALEXin (KEFLEX) 500  MG capsule Take 1 capsule (500 mg total) by mouth 3 (three) times daily. 10/29/2020: States she completed   Cholecalciferol (VITAMIN D3) 50 MCG (2000 UT) CHEW Chew 4,000 Units by mouth daily.    Cyanocobalamin (B-12 PO) Take 2 tablets by mouth daily.    furosemide (LASIX) 80 MG tablet Take 1 tablet (80 mg total) by mouth 3 (three) times daily.    HYDROcodone-acetaminophen (NORCO/VICODIN) 5-325 MG tablet Take 1 tablet by mouth 2 (two) times daily as needed for moderate pain.    insulin glargine (LANTUS SOLOSTAR) 100 UNIT/ML Solostar Pen Inject 25 Units into the skin at bedtime.    Insulin Pen Needle (DROPLET PEN NEEDLES) 32G X 4 MM MISC USE AS DIRECTED ONE TIME DAILY    lisinopril (ZESTRIL) 20 MG tablet Take 1 tablet (20 mg total) by mouth daily.    metFORMIN (GLUCOPHAGE) 1000 MG tablet TAKE 1 TABLET TWICE DAILY (Patient taking differently: Take 1,000 mg by mouth in the morning and at bedtime.)    potassium chloride (KLOR-CON) 10 MEQ tablet TAKE 2 TABLETS THREE TIMES DAILY. TAKE WITH LASIX.    pramipexole (MIRAPEX) 1 MG tablet Take 1 tablet (1 mg total) by mouth 2 (two) times daily.    triamcinolone cream (KENALOG) 0.1 % Apply 1 application topically 2 (two) times daily as needed (redness). Apply to red areas on legs    No facility-administered encounter medications on file as of 01/16/2021.    Patient Active Problem List   Diagnosis Date Noted  Persistent atrial fibrillation ([place]) 09/26/2020   Educated about COVID-19 virus infection 11/30/2019   Chronic respiratory failure with hypoxia and hypercapnia ([place]) 07/11/2019   Acute on chronic diastolic CHF (congestive heart failure) ([place]) 07/08/2019   Hypoxia 07/07/2019   Hypokalemia 05/03/2019   Facial asymmetry, acquired 08/20/2016   Fracture of knee prosthesis ([place]) 08/01/2016   Obesity 02/20/2016   Chronic pain syndrome 02/19/2016   Mild episode of recurrent major depressive disorder ([place]) 08/02/2015   Chronic diastolic congestive heart  failure ([place]) 07/16/2015   Type 2 diabetes mellitus with diabetic neuropathy ([place]) 09/27/2014   Hearing loss - has hearing aides, follow by audiologist 10/11/2013   No blood products ([place])  09/11/2013   Disorder of bone and cartilage 06/10/2010   Breast cancer of upper-outer quadrant of right female breast ([place]) 04/04/2010   RESTLESS LEGS SYNDROME 05/05/2007   Obstructive sleep apnea 03/01/2007   Essential hypertension 01/27/2007   Osteoarthritis 01/27/2007    Conditions to be addressed/monitored:CHF and DMII  Care Plan : Heart Failure (Adult)  Updates made by Leona Singleton, RN since 01/16/2021 12:00 AM     Problem: Symptom Exacerbation (Heart Failure)   Priority: Medium     Long-Range Goal: Symptom Exacerbation Prevented or Minimized   Start Date: 10/10/2020  Expected End Date: 05/16/2021  This Visit's Progress: On track  Recent Progress: On track  Priority: Medium  Note:   Current Barriers:  Knowledge deficit related to basic heart failure pathophysiology and self care management as evidenced by patient not able to verbalize when she needs to contact provider.  Does report weighing daily with ranges of 189-193 pounds.  Denies any lower extremity swelling or increase in shortness of breath.  Patient reporting she returned home from visiting daughter this past Tuesday.  States she does not feel well. Weak and a little faint.  Reporting periods of diarrhea (not new).  Does report needing more streamlining with medications and would be interested in prepackaged medications.  Continues with home health therapy and nursing Case Manager Clinical Goal(s):  patient will weigh self daily and record patient will verbalize understanding of Heart Failure Action Plan and when to call doctor patient will take all Heart Failure mediations as prescribed patient will weigh daily and record (notifying MD of 3 lb weight gain over night or 5 lb in a week) Interventions:  Collaboration  with Allwardt, Randa Evens, PA-C regarding development and update of comprehensive plan of care as evidenced by provider attestation and co-signature Inter-disciplinary care team collaboration (see longitudinal plan of care) Basic overview and discussion of pathophysiology of Heart Failure reviewed and encouraged to follow rescue plan if symptoms flare-up Provided verbal education on low sodium diet Reviewed Heart Failure Action Plan in depth  Discussed importance of daily weight and advised patient to weigh and record daily Reviewed role of diuretics in prevention of fluid overload and management of heart failure Barriers to lifestyle changes reviewed and addressed Barriers to treatment reviewed and addressed and healthy lifestyle promoted Self-awareness of signs/symptoms of worsening disease encouraged Attends all scheduled provider appointments Encouraged to eat more whole grains, fruits and vegetables, lean meats and healthy fats Discussed know when to call the doctor Assessed understanding of adherence and barriers to treatment plan, as well as lifestyle changes; develop strategies to address barriers Encouraged patient to review home care personal services list and to consider private paying for assistance at home Discussed with patient home health versus personal care services and home maid assistance; encouraged  patient/daughter to consider private pay personal aide Encouraged use of walker with all ambulation; discussed standing in front of recliner, if needing to stand for periods of time due to back pain RNCM sent message to  CCM Pharmacist to request they discuss prepackaged meds Patient Goals/Self-Care Activities: Call office if I gain more than 2 pounds in one day or 5 pounds in one week Keep legs up while sitting Weigh myself daily and track weight in diary Use salt in moderation Watch for swelling in feet, ankles and legs every day Fall precautions and prevention; use walker with  all ambulation Follow Up Plan: The care management team will reach out to the patient again over the next 45 business days.       Care Plan : Diabetes Type 2 (Adult)  Updates made by Leona Singleton, RN since 01/16/2021 12:00 AM     Problem: Glycemic Management (Diabetes, Type 2)   Priority: Medium     Long-Range Goal: Patient will maintain Hgb A1c of below 7 within the next 90 days   Start Date: 10/10/2020  Expected End Date: 05/16/2021  This Visit's Progress: On track  Recent Progress: Not on track  Priority: Medium  Note:   Objective:  Lab Results  Component Value Date   HGBA1C 6.9 (H) 06/11/2020  Current Barriers:  Knowledge Deficits related to basic Diabetes pathophysiology and self care/management Patient stating she does not feel well as she answered telephone.  Luckily the home health nurse was there for visit.  Checked blood sugar, 114 with recent ranges of 100-132.  Does report she did go to visit daughter in [place]. Case Manager Clinical Goal(s):  patient will demonstrate improved adherence to prescribed treatment plan for diabetes self care/management as evidenced by: adherence to ADA/ carb modified diet adherence to prescribed medication regimen Interventions:  Collaboration with Allwardt, Randa Evens, PA-C regarding development and update of comprehensive plan of care as evidenced by provider attestation and co-signature Inter-disciplinary care team collaboration (see longitudinal plan of care) Provided education to patient about basic DM disease process Reviewed medications with patient and discussed importance of medication adherence Discussed plans with patient for ongoing care management follow up and provided patient with direct contact information for care management team Provided patient with educational materials related to hypo and hyperglycemia and importance of correct treatment Barriers to adherence to treatment plan identified Blood glucose monitoring  encouraged Self-awareness of signs/symptoms of hypo or hyperglycemia encouraged Encouraged to attend all scheduled provider appointments Reviewed mutually-set A1C goal Sent Living Well with Diabetes Educational Packet and encouraged patient to review Confirmed patient still has [place] number [phone]) Emotional support and empathy provided to patient Encouraged and instructed patient to check blood sugars at least twice a day while on insulin injections Encouraged to continue to work with CCM Pharmacist to help with possible pill packs for patient and possible medication compliance [facility] Nurse-Leslie  Discussed with patient possible [place] versus ALF (declines at this time but encouraged to discuss with daughter) Patient Goals/Self-Care Activities: Set target A1C 7 (current 6.9) Check blood sugars 2 times a day and document in log Discuss pill pack with CCM Pharmacist Work with home health nursing and therapist Follow Up Plan: The care management team will reach out to the patient again over the next 30 business days.        Plan:The care management team will reach out to the patient again over the next 45 business days.  Hubert Azure RN,  MSN RN Care Management Coordinator  [place] [facility] [phone] [email or web address]

## 2021-01-16 NOTE — Patient Instructions (Signed)
Visit Information  PATIENT GOALS:  Goals Addressed             This Visit's Progress    (RNCM) Set My Target A1C-Diabetes Type 2   On track    Timeframe:  Long-Range Goal Priority:  Medium Start Date:   10/10/20                          Expected End Date:  05/16/21                     Follow Up Date 02/11/21    Set target A1C 7 (current 6.9) Check blood sugars 2 times a day and document in log Discuss pill pack with CCM Pharmacist Work with home health nursing and therapist   Why is this important?   Your target A1C is decided together by you and your doctor.  It is based on several things like your age and other health issues.    Notes:      (RNCM) Track and Manage Fluids and Swelling-Heart Failure   On track    Timeframe:  Long-Range Goal Priority:  Medium Start Date:   10/10/20                          Expected End Date: 05/16/21                    Follow Up Date  02/11/21    Call office if I gain more than 2 pounds in one day or 5 pounds in one week Keep legs up while sitting Weigh myself daily and track weight in diary Use salt in moderation Watch for swelling in feet, ankles and legs every day Fall precautions and prevention; use walker with all ambulation   Why is this important?   It is important to check your weight daily and watch how much salt and liquids you have.  It will help you to manage your heart failure.    Notes:         Patient verbalizes understanding of instructions provided today and agrees to view in [place].   The care management team will reach out to the patient again over the next 45 business days.   Hubert Azure RN, MSN RN Care Management Coordinator  [facility] [phone] [email or web address]

## 2021-01-21 ENCOUNTER — Ambulatory Visit: Payer: Medicare HMO | Admitting: Pharmacist

## 2021-01-21 ENCOUNTER — Telehealth: Payer: Self-pay

## 2021-01-21 DIAGNOSIS — I10 Essential (primary) hypertension: Secondary | ICD-10-CM

## 2021-01-21 DIAGNOSIS — E1140 Type 2 diabetes mellitus with diabetic neuropathy, unspecified: Secondary | ICD-10-CM

## 2021-01-21 DIAGNOSIS — Z794 Long term (current) use of insulin: Secondary | ICD-10-CM

## 2021-01-21 NOTE — Telephone Encounter (Signed)
LVM for patient to call back and schedule appt per Alyssa " Team, I need to see her for assessment about the diarrhea complaint, please schedule. "

## 2021-01-21 NOTE — Progress Notes (Signed)
Chronic Care Management Pharmacy Note  01/21/2021 Name:  [name] MRN:  [id number] DOB:  [birth year]  Recommendations/changes: Getting patient set up with pill packaging through [facility].  She will get everything but her Lantus through them because mail order is her preferred and her Lantus price will be the best through them  Subjective: [name] is an 80 y.o. year old female who is a primary patient of Allwardt, Alyssa M, PA-C.  The CCM team was consulted for assistance with disease management and care coordination needs.    Engaged with patient by telephone for follow up visit in response to provider referral for pharmacy case management and/or care coordination services.   Consent to Services:  The patient was given information about Chronic Care Management services, agreed to services, and gave verbal consent prior to initiation of services.  Please see initial visit note for detailed documentation.   Patient Care Team: Allwardt, Randa Evens, PA-C as PCP - General (Physician Assistant) Minus Breeding, MD as PCP - Cardiology (Cardiology) Suella Broad, MD as Consulting Physician (Physical Medicine and Rehabilitation) Minus Breeding, MD as Consulting Physician (Cardiology) Madelin Headings, DO (Optometry) Leona Singleton, RN as Case Manager Edythe Clarity, [facility] (Pharmacist)  Objective:  Lab Results  Component Value Date   CREATININE 0.91 09/27/2020   CREATININE 0.85 08/29/2020   CREATININE 0.83 07/31/2020    Lab Results  Component Value Date   HGBA1C 6.9 (H) 06/11/2020   Last diabetic Eye exam:  Lab Results  Component Value Date/Time   HMDIABEYEEXA No Retinopathy 12/20/2018 12:00 AM    Last diabetic Foot exam:  Lab Results  Component Value Date/Time   HMDIABFOOTEX done 02/14/2010 12:00 AM        Component Value Date/Time   CHOL 101 06/11/2020 1032   TRIG 95.0 06/11/2020 1032   HDL 31.40 (L) 06/11/2020 1032   CHOLHDL 3 06/11/2020 1032    VLDL 19.0 06/11/2020 1032   LDLCALC 51 06/11/2020 1032   LDLDIRECT 111.6 02/14/2010 1406    Hepatic Function Latest Ref Rng & Units 06/11/2020 01/03/2020 12/27/2018  Total Protein 6.0 - 8.3 g/dL 7.0 6.9 7.2  Albumin 3.5 - 5.2 g/dL 3.7 3.9 4.1  AST 0 - 37 U/L '18 23 25  ' ALT 0 - 35 U/L '13 16 21  ' Alk Phosphatase 39 - 117 U/L 102 88 104  Total Bilirubin 0.2 - 1.2 mg/dL 0.6 0.6 0.5  Bilirubin, Direct 0.1 - 0.5 mg/dL - - -    Lab Results  Component Value Date/Time   TSH 0.902 07/08/2019 01:02 PM   TSH 1.993 08/01/2016 02:48 PM   TSH 0.74 10/16/2011 01:48 PM   TSH 1.64 10/20/2006 11:12 AM    CBC Latest Ref Rng & Units 09/27/2020 05/27/2020 04/25/2020  WBC 3.4 - 10.8 x10E3/uL 8.8 10.3 9.8  Hemoglobin 11.1 - 15.9 g/dL 14.0 14.3 14.3  Hematocrit 34.0 - 46.6 % 41.4 42.4 42.6  Platelets 150 - 450 x10E3/uL 240 269 248    Lab Results  Component Value Date/Time   VD25OH 32.73 01/03/2020 11:44 AM   VD25OH 22.48 (L) 12/18/2019 11:02 AM    Clinical ASCVD:  The ASCVD Risk score Mikey Bussing DC Jr., et al., 2013) failed to calculate for the following reasons:   The 2013 ASCVD risk score is only valid for ages 33 to 82     Social History   Tobacco Use  Smoking Status Never  Smokeless Tobacco Never   BP Readings from Last 3  Encounters:  11/20/20 (!) 158/92  10/16/20 (!) 144/80  09/27/20 128/82   Pulse Readings from Last 3 Encounters:  11/20/20 83  10/16/20 (!) 108  09/27/20 79   Wt Readings from Last 3 Encounters:  11/20/20 192 lb (87.1 kg)  10/16/20 194 lb (88 kg)  09/27/20 197 lb (89.4 kg)    Assessment: Review of patient past medical history, allergies, medications, health status, including review of consultants reports, laboratory and other test data, was performed as part of comprehensive evaluation and provision of chronic care management services.   SDOH:  (Social Determinants of Health) assessments and interventions performed:    CCM Care Plan  Allergies  Allergen Reactions    Sulfa Antibiotics Swelling     Facial Swelling "swelling lips"    Sulfonamide Derivatives Swelling    Lips Swelling   Other     BLOOD PRODUCT REFUSAL    Medications Reviewed Today     Reviewed by Edythe Clarity, [facility] (Pharmacist) on 01/21/21 at [place] List Status: <None>   Medication Order Taking? Sig Documenting Provider Last Dose Status Informant  Ascorbic Acid (VITAMIN C PO) [id number] Yes Take 2 tablets by mouth daily. [provider] Taking Active Self  aspirin EC 81 MG tablet [id number] Yes Take 1 tablet (81 mg total) by mouth daily. Swallow whole. Minus Breeding, MD Taking Active   blood glucose meter kit and supplies [id number] Yes Dispense based on patient and insurance preference. Use up to two  times daily as directed. (FOR ICD-10 E10.9, E11.9). Allwardt, Randa Evens, PA-C Taking Active   Calcium Citrate-Vitamin D (CALCIUM + D PO) [id number] Yes Take 2 tablets by mouth daily. [provider] Taking Active Self  carvedilol (COREG) 3.125 MG tablet [id number]  Take 1 tablet (3.125 mg total) by mouth 2 (two) times daily with a meal. Orma Flaming, MD  Expired 12/08/20 2359   cephALEXin (KEFLEX) 500 MG capsule [id number] Yes Take 1 capsule (500 mg total) by mouth 3 (three) times daily. Eulas Post, MD Taking Active            Med Note Leona Singleton   Tue Oct 29, 2020 11:41 AM) States she completed  Cholecalciferol (VITAMIN D3) 50 MCG (2000 UT) CHEW [id number] Yes Chew 4,000 Units by mouth daily. [provider] Taking Active Self  Cyanocobalamin (B-12 PO) [id number] Yes Take 2 tablets by mouth daily. [provider] Taking Active Self  furosemide (LASIX) 80 MG tablet [id number]  Take 1 tablet (80 mg total) by mouth 3 (three) times daily. Allwardt, Alyssa M, PA-C  Expired 11/20/20 2359   HYDROcodone-acetaminophen (NORCO/VICODIN) 5-325 MG tablet [id number] Yes Take 1 tablet by mouth 2 (two) times daily as needed for moderate pain. [provider] Taking Active Self  insulin glargine (LANTUS SOLOSTAR) 100 UNIT/ML Solostar Pen [id number] Yes Inject 25 Units into the skin at bedtime. Allwardt, Randa Evens, PA-C Taking Active   Insulin Pen Needle (DROPLET PEN NEEDLES) 32G X 4 MM MISC [id number] Yes USE AS DIRECTED ONE TIME DAILY Allwardt, Alyssa M, PA-C Taking Active   lisinopril (ZESTRIL) 20 MG tablet [id number] Yes Take 1 tablet (20 mg total) by mouth daily. Allwardt, Randa Evens, PA-C Taking Active   metFORMIN (GLUCOPHAGE) 1000 MG tablet [id number] Yes TAKE 1 TABLET TWICE DAILY  Patient taking differently: Take 1,000 mg by mouth in the morning and at bedtime.   Dutch Quint B, FNP Taking Active   potassium chloride (KLOR-CON) 10 MEQ tablet [id number] Yes TAKE  2 TABLETS THREE TIMES DAILY. TAKE WITH LASIX. Allwardt, Randa Evens, PA-C Taking Active   pramipexole (MIRAPEX) 1 MG tablet [id number]  Take 1 tablet (1 mg total) by mouth 2 (two) times daily. Orma Flaming, MD  Expired 12/08/20 2359   triamcinolone cream (KENALOG) 0.1 % [id number] Yes Apply 1 application topically 2 (two) times daily as needed (redness). Apply to red areas on legs Allwardt, Randa Evens, PA-C Taking Active             Patient Active Problem List   Diagnosis Date Noted   Persistent atrial fibrillation ([place]) 09/26/2020   Educated about COVID-19 virus infection 11/30/2019   Chronic respiratory failure with hypoxia and hypercapnia (HCC) 07/11/2019   Acute on chronic diastolic CHF (congestive heart failure) ([place]) 07/08/2019   Hypoxia 07/07/2019   Hypokalemia 05/03/2019   Facial asymmetry, acquired 08/20/2016   Fracture of knee prosthesis ([place]) 08/01/2016   Obesity 02/20/2016   Chronic pain syndrome 02/19/2016   Mild episode of recurrent major depressive disorder ([place]) 08/02/2015   Chronic diastolic congestive heart failure ([place]) 07/16/2015   Type 2 diabetes mellitus with diabetic neuropathy ([place]) 09/27/2014   Hearing loss - has hearing aides, follow by  audiologist 10/11/2013   No blood products ([place])  09/11/2013   Disorder of bone and cartilage 06/10/2010   Breast cancer of upper-outer quadrant of right female breast ([place]) 04/04/2010   RESTLESS LEGS SYNDROME 05/05/2007   Obstructive sleep apnea 03/01/2007   Essential hypertension 01/27/2007   Osteoarthritis 01/27/2007    Immunization History  Administered Date(s) Administered   Fluad Quad(high Dose 65+) 01/24/2019   Influenza Split 02/20/2011, 01/25/2012   Influenza Whole 02/16/1999, 02/25/2007, 03/07/2009, 02/14/2010   Influenza, High Dose Seasonal PF 02/17/2016, 03/23/2017, 01/20/2018   Influenza,inj,Quad PF,6+ Mos 02/01/2013, 02/19/2014, 01/30/2015   Influenza,inj,quad, With Preservative 02/15/2017   PFIZER Comirnaty(Gray Top)Covid-19 Tri-Sucrose Vaccine 07/01/2020   PFIZER(Purple Top)SARS-COV-2 Vaccination 06/06/2019, 06/26/2019   Pneumococcal Conjugate-13 01/30/2015   Pneumococcal Polysaccharide-23 10/11/2013   Td 05/18/2005   Tdap 10/21/2015   Zoster, Live 02/20/2011    Conditions to be addressed/monitored: Afib, OSA, CHF, T2DM  Care Plan : General Pharmacy (Adult)  Updates made by Edythe Clarity, RPH since 01/21/2021 12:00 AM     Problem: CHL AMB "PATIENT-SPECIFIC PROBLEM"      Long-Range Goal: Patient-Specific Goal   Start Date: 11/13/2020  Expected End Date: 11/13/2021  Recent Progress: On track  Priority: High  Note:   Current Barriers:  Unable to self administer medications as prescribed  Pharmacist Clinical Goal(s):  Patient will contact provider office for questions/concerns as evidenced notation of same in electronic health record through collaboration with PharmD and provider.   Interventions: 1:1 collaboration with Allwardt, Randa Evens, PA-C regarding development and update of comprehensive plan of care as evidenced by provider attestation and co-signature Inter-disciplinary care team collaboration (see longitudinal plan of  care) Comprehensive medication review performed; medication list updated in electronic medical record  Hypertension (BP goal <130/80) -Controlled -Current treatment: Carvedilol 3.125 mg twice daily  Lisinopril 20 mg once daily Furosemide 80 mg three times daily   -Current home readings: none provided, does have cuff and will provide/ask for asssitance using when next seen  -Denies hypotensive/hypertensive symptoms -Educated on Symptoms of hypotension and importance of maintaining adequate hydration; -Counseled to monitor BP at home 1-2x/week in addition to daily weights, document, and provide log at future appointments -Recommended to continue current medication  Update 01/21/21 Reports BP around 130/80. Denies any recent dizziness  or HA's. Stated she just took a home COVID-19 test and it was positive.  Continue to monitor for now.  Diabetes (A1c goal <7%) -Controlled -Has not located blood glucose meter, is open to receiving meter now. -Current medications: Metformin 1000 mg twice daily Lantus 25 units into the skin at bedtime  -Current home glucose readings fasting glucose: is not testing -Denies hypoglycemic/hyperglycemic symptoms -Current exercise: no formal exercise -Educated on A1c and blood sugar goals; Prevention and management of hypoglycemic episodes; Benefits of routine self-monitoring of blood sugar; -Recommended to continue current medication Will order new BG monitor after review  Update 01/21/21 Fasting this morning @ 5am - 110 Highest reported sugar around 150 She has gotten new meter and has just started checking her sugars at home Denies hypoglycemia  Continue current meds for now.  Pill packs to help with adherence  Patient Goals/Self-Care Activities Patient will:  - focus on medication adherence by using pill packaging.  Setting up with [facility] pill packs for adherence and ease of medication administration.  Follow Up Plan: CMA fu 30 days on  adherence     Patient's preferred pharmacy is:  [facility] [place], [place] [address] [place] [address] Phone: [phone] Fax: [phone]  [facility] - [place], [place] - [address] [place] [address] Phone: [phone] Fax: [phone]  Verbal consent obtained for [facility] enhanced pharmacy services (medication synchronization, adherence packaging, delivery coordination). A medication sync plan was created to allow patient to get all medications delivered once every 30 to 90 days per patient preference. Patient understands they have freedom to choose pharmacy and clinical pharmacist will coordinate care between all prescribers and [facility].  Follow Up:  Patient agrees to Care Plan and Follow-up. Future Appointments  Date Time Provider [place]  02/11/2021 11:30 AM [facility] CARE [facility] PEC  10/13/2021  8:45 AM [facility] HEALTH COACH [facility] [place], PharmD Clinical Pharmacist [phone]

## 2021-01-21 NOTE — Patient Instructions (Addendum)
Visit Information   Goals Addressed             This Visit's Progress    Improve medication adherence       Timeframe:  Long-Range Goal Priority:  High Start Date:   01/21/21                          Expected End Date: 07/21/21                      Follow Up Date 04/22/21    Work to get set up with [facility] for pill packs.   Why is this important?   These steps will help you keep on track with your medicines.   Notes:        Patient Care Plan: Heart Failure (Adult)     Problem Identified: Symptom Exacerbation (Heart Failure)   Priority: Medium     Long-Range Goal: Symptom Exacerbation Prevented or Minimized   Start Date: 10/10/2020  Expected End Date: 05/16/2021  This Visit's Progress: On track  Recent Progress: On track  Priority: Medium  Note:   Current Barriers:  Knowledge deficit related to basic heart failure pathophysiology and self care management as evidenced by patient not able to verbalize when she needs to contact provider.  Does report weighing daily with ranges of 189-193 pounds.  Denies any lower extremity swelling or increase in shortness of breath.  Patient reporting she returned home from visiting daughter this past Tuesday.  States she does not feel well. Weak and a little faint.  Reporting periods of diarrhea (not new).  Does report needing more streamlining with medications and would be interested in prepackaged medications.  Continues with home health therapy and nursing Case Manager Clinical Goal(s):  patient will weigh self daily and record patient will verbalize understanding of Heart Failure Action Plan and when to call doctor patient will take all Heart Failure mediations as prescribed patient will weigh daily and record (notifying MD of 3 lb weight gain over night or 5 lb in a week) Interventions:  Collaboration with Allwardt, Randa Evens, PA-C regarding development and update of comprehensive plan of care as evidenced by provider  attestation and co-signature Inter-disciplinary care team collaboration (see longitudinal plan of care) Basic overview and discussion of pathophysiology of Heart Failure reviewed and encouraged to follow rescue plan if symptoms flare-up Provided verbal education on low sodium diet Reviewed Heart Failure Action Plan in depth  Discussed importance of daily weight and advised patient to weigh and record daily Reviewed role of diuretics in prevention of fluid overload and management of heart failure Barriers to lifestyle changes reviewed and addressed Barriers to treatment reviewed and addressed and healthy lifestyle promoted Self-awareness of signs/symptoms of worsening disease encouraged Attends all scheduled provider appointments Encouraged to eat more whole grains, fruits and vegetables, lean meats and healthy fats Discussed know when to call the doctor Assessed understanding of adherence and barriers to treatment plan, as well as lifestyle changes; develop strategies to address barriers Encouraged patient to review home care personal services list and to consider private paying for assistance at home Discussed with patient home health versus personal care services and home maid assistance; encouraged patient/daughter to consider private pay personal aide Encouraged use of walker with all ambulation; discussed standing in front of recliner, if needing to stand for periods of time due to back pain RNCM sent message to  CCM Pharmacist to request they  discuss prepackaged meds Patient Goals/Self-Care Activities: Call office if I gain more than 2 pounds in one day or 5 pounds in one week Keep legs up while sitting Weigh myself daily and track weight in diary Use salt in moderation Watch for swelling in feet, ankles and legs every day Fall precautions and prevention; use walker with all ambulation Follow Up Plan: The care management team will reach out to the patient again over the next 45  business days.       Patient Care Plan: Diabetes Type 2 (Adult)     Problem Identified: Glycemic Management (Diabetes, Type 2)   Priority: Medium     Long-Range Goal: Patient will maintain Hgb A1c of below 7 within the next 90 days   Start Date: 10/10/2020  Expected End Date: 05/16/2021  This Visit's Progress: On track  Recent Progress: Not on track  Priority: Medium  Note:   Objective:  Lab Results  Component Value Date   HGBA1C 6.9 (H) 06/11/2020  Current Barriers:  Knowledge Deficits related to basic Diabetes pathophysiology and self care/management Patient stating she does not feel well as she answered telephone.  Luckily the home health nurse was there for visit.  Checked blood sugar, 114 with recent ranges of 100-132.  Does report she did go to visit daughter in [place]. Case Manager Clinical Goal(s):  patient will demonstrate improved adherence to prescribed treatment plan for diabetes self care/management as evidenced by: adherence to ADA/ carb modified diet adherence to prescribed medication regimen Interventions:  Collaboration with Allwardt, Randa Evens, PA-C regarding development and update of comprehensive plan of care as evidenced by provider attestation and co-signature Inter-disciplinary care team collaboration (see longitudinal plan of care) Provided education to patient about basic DM disease process Reviewed medications with patient and discussed importance of medication adherence Discussed plans with patient for ongoing care management follow up and provided patient with direct contact information for care management team Provided patient with educational materials related to hypo and hyperglycemia and importance of correct treatment Barriers to adherence to treatment plan identified Blood glucose monitoring encouraged Self-awareness of signs/symptoms of hypo or hyperglycemia encouraged Encouraged to attend all scheduled provider appointments Reviewed  mutually-set A1C goal [facility] with Diabetes Educational Packet and encouraged patient to review Confirmed patient still has [place] number [phone]) Emotional support and empathy provided to patient Encouraged and instructed patient to check blood sugars at least twice a day while on insulin injections Encouraged to continue to work with CCM Pharmacist to help with possible pill packs for patient and possible medication compliance [place] Nurse-Leslie  Discussed with patient possible [place] versus ALF (declines at this time but encouraged to discuss with daughter) Patient Goals/Self-Care Activities: Set target A1C 7 (current 6.9) Check blood sugars 2 times a day and document in log Discuss pill pack with CCM Pharmacist Work with home health nursing and therapist Follow Up Plan: The care management team will reach out to the patient again over the next 30 business days.       Patient Care Plan: General Pharmacy (Adult)     Problem Identified: CHL AMB "PATIENT-SPECIFIC PROBLEM"      Long-Range Goal: Patient-Specific Goal   Start Date: 11/13/2020  Expected End Date: 11/13/2021  Recent Progress: On track  Priority: High  Note:   Current Barriers:  Unable to self administer medications as prescribed  Pharmacist Clinical Goal(s):  Patient will contact provider office for questions/concerns as evidenced notation of same in  electronic health record through collaboration with PharmD and provider.   Interventions: 1:1 collaboration with Allwardt, Randa Evens, PA-C regarding development and update of comprehensive plan of care as evidenced by provider attestation and co-signature Inter-disciplinary care team collaboration (see longitudinal plan of care) Comprehensive medication review performed; medication list updated in electronic medical record  Hypertension (BP goal <130/80) -Controlled -Current treatment: Carvedilol 3.125 mg  twice daily  Lisinopril 20 mg once daily Furosemide 80 mg three times daily   -Current home readings: none provided, does have cuff and will provide/ask for asssitance using when next seen  -Denies hypotensive/hypertensive symptoms -Educated on Symptoms of hypotension and importance of maintaining adequate hydration; -Counseled to monitor BP at home 1-2x/week in addition to daily weights, document, and provide log at future appointments -Recommended to continue current medication  Update 01/21/21 Reports BP around 130/80. Denies any recent dizziness or HA's. Stated she just took a home COVID-19 test and it was positive.  Continue to monitor for now.  Diabetes (A1c goal <7%) -Controlled -Has not located blood glucose meter, is open to receiving meter now. -Current medications: Metformin 1000 mg twice daily Lantus 25 units into the skin at bedtime  -Current home glucose readings fasting glucose: is not testing -Denies hypoglycemic/hyperglycemic symptoms -Current exercise: no formal exercise -Educated on A1c and blood sugar goals; Prevention and management of hypoglycemic episodes; Benefits of routine self-monitoring of blood sugar; -Recommended to continue current medication Will order new BG monitor after review  Update 01/21/21 Fasting this morning @ 5am - 110 Highest reported sugar around 150 She has gotten new meter and has just started checking her sugars at home Denies hypoglycemia  Continue current meds for now.  Pill packs to help with adherence  Patient Goals/Self-Care Activities Patient will:  - focus on medication adherence by using pill packaging.  Setting up with [facility] pill packs for adherence and ease of medication administration.  Follow Up Plan: CMA fu 30 days on adherence      Patient verbalizes understanding of instructions provided today and agrees to view in [place].  Telephone follow up appointment with pharmacy team member scheduled for: 4  months  Edythe Clarity, [place]

## 2021-01-23 DIAGNOSIS — F419 Anxiety disorder, unspecified: Secondary | ICD-10-CM | POA: Diagnosis not present

## 2021-01-23 DIAGNOSIS — G5602 Carpal tunnel syndrome, left upper limb: Secondary | ICD-10-CM | POA: Diagnosis not present

## 2021-01-23 DIAGNOSIS — I5030 Unspecified diastolic (congestive) heart failure: Secondary | ICD-10-CM | POA: Diagnosis not present

## 2021-01-23 DIAGNOSIS — H9193 Unspecified hearing loss, bilateral: Secondary | ICD-10-CM | POA: Diagnosis not present

## 2021-01-23 DIAGNOSIS — E119 Type 2 diabetes mellitus without complications: Secondary | ICD-10-CM | POA: Diagnosis not present

## 2021-01-23 DIAGNOSIS — F329 Major depressive disorder, single episode, unspecified: Secondary | ICD-10-CM | POA: Diagnosis not present

## 2021-01-23 DIAGNOSIS — M159 Polyosteoarthritis, unspecified: Secondary | ICD-10-CM | POA: Diagnosis not present

## 2021-01-23 DIAGNOSIS — G4730 Sleep apnea, unspecified: Secondary | ICD-10-CM | POA: Diagnosis not present

## 2021-01-23 DIAGNOSIS — I110 Hypertensive heart disease with heart failure: Secondary | ICD-10-CM | POA: Diagnosis not present

## 2021-01-24 ENCOUNTER — Other Ambulatory Visit: Payer: Self-pay | Admitting: Physician Assistant

## 2021-01-24 DIAGNOSIS — I5030 Unspecified diastolic (congestive) heart failure: Secondary | ICD-10-CM | POA: Diagnosis not present

## 2021-01-24 DIAGNOSIS — E119 Type 2 diabetes mellitus without complications: Secondary | ICD-10-CM | POA: Diagnosis not present

## 2021-01-24 DIAGNOSIS — E1140 Type 2 diabetes mellitus with diabetic neuropathy, unspecified: Secondary | ICD-10-CM

## 2021-01-24 DIAGNOSIS — F419 Anxiety disorder, unspecified: Secondary | ICD-10-CM | POA: Diagnosis not present

## 2021-01-24 DIAGNOSIS — H9193 Unspecified hearing loss, bilateral: Secondary | ICD-10-CM | POA: Diagnosis not present

## 2021-01-24 DIAGNOSIS — G5602 Carpal tunnel syndrome, left upper limb: Secondary | ICD-10-CM | POA: Diagnosis not present

## 2021-01-24 DIAGNOSIS — I110 Hypertensive heart disease with heart failure: Secondary | ICD-10-CM | POA: Diagnosis not present

## 2021-01-24 DIAGNOSIS — G4730 Sleep apnea, unspecified: Secondary | ICD-10-CM | POA: Diagnosis not present

## 2021-01-24 DIAGNOSIS — M159 Polyosteoarthritis, unspecified: Secondary | ICD-10-CM | POA: Diagnosis not present

## 2021-01-24 DIAGNOSIS — I10 Essential (primary) hypertension: Secondary | ICD-10-CM

## 2021-01-24 DIAGNOSIS — F329 Major depressive disorder, single episode, unspecified: Secondary | ICD-10-CM | POA: Diagnosis not present

## 2021-01-24 MED ORDER — METFORMIN HCL 1000 MG PO TABS
1000.0000 mg | ORAL_TABLET | Freq: Two times a day (BID) | ORAL | 0 refills | Status: DC
Start: 2021-01-24 — End: 2021-02-10

## 2021-01-24 MED ORDER — FUROSEMIDE 80 MG PO TABS: 80.0000 mg | ORAL_TABLET | Freq: Three times a day (TID) | ORAL | 0 refills | Status: DC

## 2021-01-24 MED ORDER — POTASSIUM CHLORIDE CRYS ER 20 MEQ PO TBCR: 20.0000 meq | EXTENDED_RELEASE_TABLET | Freq: Three times a day (TID) | ORAL | 0 refills | Status: DC

## 2021-01-24 MED ORDER — CARVEDILOL 3.125 MG PO TABS: 3.1250 mg | ORAL_TABLET | Freq: Two times a day (BID) | ORAL | 1 refills | Status: DC

## 2021-01-24 MED ORDER — PRAMIPEXOLE DIHYDROCHLORIDE 1 MG PO TABS: 1.0000 mg | ORAL_TABLET | Freq: Two times a day (BID) | ORAL | 0 refills | Status: DC

## 2021-01-27 ENCOUNTER — Telehealth: Payer: Self-pay

## 2021-01-27 NOTE — Telephone Encounter (Signed)
Home Health Verbal Orders  Agency:  [facility]   Caller: Heron Sabins [phone]  Contact and title   Requesting OT/ PT/ Skilled nursing/ Social Work/ Speech:  PT  Reason for Request:    Frequency:  Once a week for 3 week   HH needs F2F w/in last 30 days   Can leave a VM

## 2021-01-27 NOTE — Telephone Encounter (Signed)
Gave ok for verbal orders.  

## 2021-01-28 DIAGNOSIS — E119 Type 2 diabetes mellitus without complications: Secondary | ICD-10-CM | POA: Diagnosis not present

## 2021-01-28 DIAGNOSIS — G5602 Carpal tunnel syndrome, left upper limb: Secondary | ICD-10-CM | POA: Diagnosis not present

## 2021-01-28 DIAGNOSIS — H9193 Unspecified hearing loss, bilateral: Secondary | ICD-10-CM | POA: Diagnosis not present

## 2021-01-28 DIAGNOSIS — G4730 Sleep apnea, unspecified: Secondary | ICD-10-CM | POA: Diagnosis not present

## 2021-01-28 DIAGNOSIS — I5030 Unspecified diastolic (congestive) heart failure: Secondary | ICD-10-CM | POA: Diagnosis not present

## 2021-01-28 DIAGNOSIS — I110 Hypertensive heart disease with heart failure: Secondary | ICD-10-CM | POA: Diagnosis not present

## 2021-01-28 DIAGNOSIS — F419 Anxiety disorder, unspecified: Secondary | ICD-10-CM | POA: Diagnosis not present

## 2021-01-28 DIAGNOSIS — F329 Major depressive disorder, single episode, unspecified: Secondary | ICD-10-CM | POA: Diagnosis not present

## 2021-01-28 DIAGNOSIS — M159 Polyosteoarthritis, unspecified: Secondary | ICD-10-CM | POA: Diagnosis not present

## 2021-01-28 NOTE — Chronic Care Management (AMB) (Signed)
  Chronic Care Management   Note  01/28/2021 Name: [name] MRN: [id number] DOB: [birth year]  [name] is a 80 y.o. year old female who is a primary care patient of Allwardt, Alyssa M, PA-C. [name] is currently enrolled in care management services. An additional referral for LCSW was placed.   Follow up plan: Unsuccessful telephone outreach attempt made.  The care management team will reach out to the patient again over the next 7 days.  If patient returns call to provider office, please advise to call [facility]  at [place], [place], [place], [place] [address] Direct Dial: [phone] [email or web address]

## 2021-01-30 ENCOUNTER — Telehealth: Payer: Self-pay

## 2021-01-30 DIAGNOSIS — I110 Hypertensive heart disease with heart failure: Secondary | ICD-10-CM | POA: Diagnosis not present

## 2021-01-30 DIAGNOSIS — F329 Major depressive disorder, single episode, unspecified: Secondary | ICD-10-CM | POA: Diagnosis not present

## 2021-01-30 DIAGNOSIS — G4730 Sleep apnea, unspecified: Secondary | ICD-10-CM | POA: Diagnosis not present

## 2021-01-30 DIAGNOSIS — G5602 Carpal tunnel syndrome, left upper limb: Secondary | ICD-10-CM | POA: Diagnosis not present

## 2021-01-30 DIAGNOSIS — I5030 Unspecified diastolic (congestive) heart failure: Secondary | ICD-10-CM | POA: Diagnosis not present

## 2021-01-30 DIAGNOSIS — E119 Type 2 diabetes mellitus without complications: Secondary | ICD-10-CM | POA: Diagnosis not present

## 2021-01-30 DIAGNOSIS — M159 Polyosteoarthritis, unspecified: Secondary | ICD-10-CM | POA: Diagnosis not present

## 2021-01-30 DIAGNOSIS — H9193 Unspecified hearing loss, bilateral: Secondary | ICD-10-CM | POA: Diagnosis not present

## 2021-01-30 DIAGNOSIS — F419 Anxiety disorder, unspecified: Secondary | ICD-10-CM | POA: Diagnosis not present

## 2021-01-30 NOTE — Telephone Encounter (Signed)
[name] from [facility] called regarding medication, said they spoke to a Judson Roch to begin with about pre-pack meds. [name] would like a call back at [phone]. Please Advise.

## 2021-02-03 ENCOUNTER — Ambulatory Visit: Payer: Medicare HMO | Admitting: Physician Assistant

## 2021-02-03 DIAGNOSIS — M159 Polyosteoarthritis, unspecified: Secondary | ICD-10-CM | POA: Diagnosis not present

## 2021-02-03 DIAGNOSIS — F419 Anxiety disorder, unspecified: Secondary | ICD-10-CM | POA: Diagnosis not present

## 2021-02-03 DIAGNOSIS — I5030 Unspecified diastolic (congestive) heart failure: Secondary | ICD-10-CM | POA: Diagnosis not present

## 2021-02-03 DIAGNOSIS — F329 Major depressive disorder, single episode, unspecified: Secondary | ICD-10-CM | POA: Diagnosis not present

## 2021-02-03 DIAGNOSIS — I110 Hypertensive heart disease with heart failure: Secondary | ICD-10-CM | POA: Diagnosis not present

## 2021-02-03 DIAGNOSIS — H9193 Unspecified hearing loss, bilateral: Secondary | ICD-10-CM | POA: Diagnosis not present

## 2021-02-03 DIAGNOSIS — E119 Type 2 diabetes mellitus without complications: Secondary | ICD-10-CM | POA: Diagnosis not present

## 2021-02-03 DIAGNOSIS — G4730 Sleep apnea, unspecified: Secondary | ICD-10-CM | POA: Diagnosis not present

## 2021-02-03 DIAGNOSIS — G5602 Carpal tunnel syndrome, left upper limb: Secondary | ICD-10-CM | POA: Diagnosis not present

## 2021-02-04 DIAGNOSIS — M5416 Radiculopathy, lumbar region: Secondary | ICD-10-CM | POA: Diagnosis not present

## 2021-02-04 NOTE — Telephone Encounter (Signed)
Called Magda Paganini and LVM 02/04/21 @ 4:26

## 2021-02-06 DIAGNOSIS — I5030 Unspecified diastolic (congestive) heart failure: Secondary | ICD-10-CM | POA: Diagnosis not present

## 2021-02-06 DIAGNOSIS — F419 Anxiety disorder, unspecified: Secondary | ICD-10-CM | POA: Diagnosis not present

## 2021-02-06 DIAGNOSIS — G5602 Carpal tunnel syndrome, left upper limb: Secondary | ICD-10-CM | POA: Diagnosis not present

## 2021-02-06 DIAGNOSIS — E119 Type 2 diabetes mellitus without complications: Secondary | ICD-10-CM | POA: Diagnosis not present

## 2021-02-06 DIAGNOSIS — M159 Polyosteoarthritis, unspecified: Secondary | ICD-10-CM | POA: Diagnosis not present

## 2021-02-06 DIAGNOSIS — G4730 Sleep apnea, unspecified: Secondary | ICD-10-CM | POA: Diagnosis not present

## 2021-02-06 DIAGNOSIS — I110 Hypertensive heart disease with heart failure: Secondary | ICD-10-CM | POA: Diagnosis not present

## 2021-02-06 DIAGNOSIS — H9193 Unspecified hearing loss, bilateral: Secondary | ICD-10-CM | POA: Diagnosis not present

## 2021-02-06 DIAGNOSIS — F329 Major depressive disorder, single episode, unspecified: Secondary | ICD-10-CM | POA: Diagnosis not present

## 2021-02-07 ENCOUNTER — Other Ambulatory Visit: Payer: Self-pay | Admitting: Physician Assistant

## 2021-02-07 DIAGNOSIS — Z1231 Encounter for screening mammogram for malignant neoplasm of breast: Secondary | ICD-10-CM

## 2021-02-10 ENCOUNTER — Ambulatory Visit (HOSPITAL_COMMUNITY)
Admission: RE | Admit: 2021-02-10 | Discharge: 2021-02-10 | Disposition: A | Discharge status: Home / Self Care | Payer: Medicare HMO | Source: Ambulatory Visit | Attending: Physician Assistant | Admitting: Physician Assistant

## 2021-02-10 ENCOUNTER — Encounter: Payer: Self-pay | Admitting: Physician Assistant

## 2021-02-10 ENCOUNTER — Ambulatory Visit (INDEPENDENT_AMBULATORY_CARE_PROVIDER_SITE_OTHER): Payer: Medicare HMO | Admitting: Physician Assistant

## 2021-02-10 ENCOUNTER — Other Ambulatory Visit: Payer: Self-pay

## 2021-02-10 VITALS — BP 131/85 | HR 86 | Temp 97.3°F | Ht 61.0 in | Wt 197.2 lb

## 2021-02-10 DIAGNOSIS — E1159 Type 2 diabetes mellitus with other circulatory complications: Secondary | ICD-10-CM

## 2021-02-10 DIAGNOSIS — Z794 Long term (current) use of insulin: Secondary | ICD-10-CM | POA: Diagnosis not present

## 2021-02-10 DIAGNOSIS — R296 Repeated falls: Secondary | ICD-10-CM | POA: Diagnosis not present

## 2021-02-10 DIAGNOSIS — E1140 Type 2 diabetes mellitus with diabetic neuropathy, unspecified: Secondary | ICD-10-CM

## 2021-02-10 DIAGNOSIS — I152 Hypertension secondary to endocrine disorders: Secondary | ICD-10-CM

## 2021-02-10 DIAGNOSIS — R413 Other amnesia: Secondary | ICD-10-CM | POA: Diagnosis not present

## 2021-02-10 DIAGNOSIS — Z79899 Other long term (current) drug therapy: Secondary | ICD-10-CM

## 2021-02-10 DIAGNOSIS — Z113 Encounter for screening for infections with a predominantly sexual mode of transmission: Secondary | ICD-10-CM | POA: Diagnosis not present

## 2021-02-10 DIAGNOSIS — S0001XA Abrasion of scalp, initial encounter: Secondary | ICD-10-CM | POA: Diagnosis not present

## 2021-02-10 DIAGNOSIS — R55 Syncope and collapse: Secondary | ICD-10-CM

## 2021-02-10 DIAGNOSIS — R419 Unspecified symptoms and signs involving cognitive functions and awareness: Secondary | ICD-10-CM | POA: Diagnosis not present

## 2021-02-10 DIAGNOSIS — Z118 Encounter for screening for other infectious and parasitic diseases: Secondary | ICD-10-CM | POA: Diagnosis not present

## 2021-02-10 DIAGNOSIS — I10 Essential (primary) hypertension: Secondary | ICD-10-CM

## 2021-02-10 LAB — COMPREHENSIVE METABOLIC PANEL
ALT: 12 U/L (ref 0–35)
AST: 16 U/L (ref 0–37)
Albumin: 3.8 g/dL (ref 3.5–5.2)
Alkaline Phosphatase: 100 U/L (ref 39–117)
BUN: 18 mg/dL (ref 6–23)
CO2: 32 mEq/L (ref 19–32)
Calcium: 9.2 mg/dL (ref 8.4–10.5)
Chloride: 98 mEq/L (ref 96–112)
Creatinine, Ser: 0.81 mg/dL (ref 0.40–1.20)
GFR: 67.86 mL/min (ref >60.00)
Glucose, Bld: 91 mg/dL (ref 70–99)
Potassium: 3.2 mEq/L — ABNORMAL LOW (ref 3.5–5.1)
Sodium: 140 mEq/L (ref 135–145)
Total Bilirubin: 0.5 mg/dL (ref 0.2–1.2)
Total Protein: 6.9 g/dL (ref 6.0–8.3)

## 2021-02-10 LAB — CBC WITH DIFFERENTIAL/PLATELET
Basophils Absolute: 0 10*3/uL (ref 0.0–0.1)
Basophils Relative: 0.3 % (ref 0.0–3.0)
Eosinophils Absolute: 0.3 10*3/uL (ref 0.0–0.7)
Eosinophils Relative: 2.9 % (ref 0.0–5.0)
HCT: 41.7 % (ref 36.0–46.0)
Hemoglobin: 13.8 g/dL (ref 12.0–15.0)
Lymphocytes Relative: 23.7 % (ref 12.0–46.0)
Lymphs Abs: 2.8 10*3/uL (ref 0.7–4.0)
MCHC: 33 g/dL (ref 30.0–36.0)
MCV: 95.4 fl (ref 78.0–100.0)
Monocytes Absolute: 0.8 10*3/uL (ref 0.1–1.0)
Monocytes Relative: 7 % (ref 3.0–12.0)
Neutro Abs: 7.9 10*3/uL — ABNORMAL HIGH (ref 1.4–7.7)
Neutrophils Relative %: 66.1 % (ref 43.0–77.0)
Platelets: 241 10*3/uL (ref 150.0–400.0)
RBC: 4.37 Mil/uL (ref 3.87–5.11)
RDW: 13.7 % (ref 11.5–15.5)
WBC: 11.9 10*3/uL — ABNORMAL HIGH (ref 4.0–10.5)

## 2021-02-10 LAB — HEMOGLOBIN A1C: Hgb A1c MFr Bld: 7.4 % — ABNORMAL HIGH (ref 4.6–6.5)

## 2021-02-10 LAB — VITAMIN B12: Vitamin B-12: 226 pg/mL (ref 211–911)

## 2021-02-10 LAB — TSH: TSH: 1.69 u[IU]/mL (ref 0.35–5.50)

## 2021-02-10 MED ORDER — LISINOPRIL 20 MG PO TABS: 20.0000 mg | ORAL_TABLET | Freq: Every day | ORAL | 2 refills | Status: DC

## 2021-02-10 MED ORDER — FUROSEMIDE 80 MG PO TABS: 80.0000 mg | ORAL_TABLET | Freq: Three times a day (TID) | ORAL | 0 refills | Status: DC

## 2021-02-10 MED ORDER — POTASSIUM CHLORIDE CRYS ER 20 MEQ PO TBCR: 20.0000 meq | EXTENDED_RELEASE_TABLET | Freq: Three times a day (TID) | ORAL | 0 refills | Status: DC

## 2021-02-10 MED ORDER — PRAMIPEXOLE DIHYDROCHLORIDE 1 MG PO TABS: 1.0000 mg | ORAL_TABLET | Freq: Two times a day (BID) | ORAL | 0 refills | Status: DC

## 2021-02-10 MED ORDER — METFORMIN HCL 1000 MG PO TABS: 1000.0000 mg | ORAL_TABLET | Freq: Two times a day (BID) | ORAL | 0 refills | Status: DC

## 2021-02-10 MED ORDER — ASPIRIN EC 81 MG PO TBEC: 81.0000 mg | DELAYED_RELEASE_TABLET | Freq: Every day | ORAL | 3 refills | Status: DC

## 2021-02-10 MED ORDER — CARVEDILOL 3.125 MG PO TABS
3.1250 mg | ORAL_TABLET | Freq: Two times a day (BID) | ORAL | 1 refills | Status: DC
Start: 2021-02-10 — End: 2021-03-31

## 2021-02-10 MED ORDER — LANTUS SOLOSTAR 100 UNIT/ML ~~LOC~~ SOPN: 25.0000 [IU] | PEN_INJECTOR | Freq: Every day | SUBCUTANEOUS | 2 refills | Status: DC

## 2021-02-10 NOTE — Progress Notes (Signed)
Subjective:    Patient ID: [name], female    DOB: Jan 16, 1941, 80 y.o.   MRN: [id number]  Chief Complaint  Patient presents with   Referral    HPI Patient is in today for face-to-face visit for [place] Orders. Request for OT, PT, Skilled Nursing, & Social work. Husband drove her to the appointment today, but is waiting in the car.  She states she fell on 02/06/21. She is not sure what happened and says she only remembers "coming back to." Denies any headache.  She denies any chest pain or shortness of breath.  No nausea or vomiting.  She did note that she had an abrasion of her scalp, but denies any head pain.  She has been having recurrent falls and possible other syncopal episodes of unknown etiology for some time now.  She also has her glucose monitoring device with her and wanted me to take a look at her latest numbers: 170, 104, 115, 119, 138, 74, 145,116, 175, 123.  Patient has been working with our CCM team and they are also requesting pill packaging through [facility] to help with her medication dispensing.  Past Medical History:  Diagnosis Date   AK (actinic keratosis)    Anxiety    occasional   Breast cancer ([place])    R breast, s/p radiation 34Gy/85fx 04/29/10-05/05/10   CTS (carpal tunnel syndrome)    Left   Diabetes (HCC)    Diastolic CHF ([place])    Dx w/ hospitalization 06/2015 for respiratory failure   Facial asymmetry, acquired 08/20/2016   From injury   Falls frequently    "can not pick left leg up"   Hearing loss    bilateral hearing aides   History of environmental allergies    History of kidney stones 20 years ago   Hypertension    MDD (major depressive disorder)    No blood products ([facility])  09/11/2013   Osteoarthritis    hx shoulder, knee, back, wrist pain; saw Dr. Wynelle Link    Personal history of radiation therapy    RESTLESS LEGS SYNDROME 05/05/2007   Qualifier: Diagnosis of  By: Gwenette Greet MD, Armando Reichert    RLS (restless legs  syndrome)    Sleep apnea     Past Surgical History:  Procedure Laterality Date   APPENDECTOMY  age 55   BREAST LUMPECTOMY Right 2012   BREAST SURGERY  2012   rt breast lumpectomy   CATARACT EXTRACTION W/PHACO Left 08/12/2020   Procedure: CATARACT EXTRACTION PHACO AND INTRAOCULAR LENS PLACEMENT LEFT EYE;  Surgeon: Baruch Goldmann, MD;  Location: [facility];  Service: Ophthalmology;  Laterality: Left;  left CDE:13.68   CATARACT EXTRACTION W/PHACO Right 08/26/2020   Procedure: CATARACT EXTRACTION PHACO AND INTRAOCULAR LENS PLACEMENT RIGHT EYE;  Surgeon: Baruch Goldmann, MD;  Location: [facility];  Service: Ophthalmology;  Laterality: Right;  right CDE=12.48   flex signoidoscopy     INCISION AND DRAINAGE PERIRECTAL ABSCESS     JOINT REPLACEMENT Right 2006   knee   REPLACEMENT TOTAL KNEE Right    TOTAL KNEE ARTHROPLASTY Left 08/06/2014   Procedure: LEFT TOTAL KNEE ARTHROPLASTY;  Surgeon: Gaynelle Arabian, MD;  Location: [facility];  Service: Orthopedics;  Laterality: Left;   TUBAL LIGATION  1979    Family History  Problem Relation Age of Onset   Alcohol abuse Father    Cancer Sister        breast   Heart disease Paternal Grandfather    Diabetes Other  Obesity Other    Heart disease Brother        Valve    Social History   Tobacco Use   Smoking status: Never   Smokeless tobacco: Never  Vaping Use   Vaping Use: Never used  Substance Use Topics   Alcohol use: No   Drug use: No     Allergies  Allergen Reactions   Sulfa Antibiotics Swelling     Facial Swelling "swelling lips"    Sulfonamide Derivatives Swelling    Lips Swelling   Other     BLOOD PRODUCT REFUSAL    Review of Systems REFER TO HPI FOR PERTINENT POSITIVES AND NEGATIVES      Objective:     BP 131/85   Pulse 86   Temp (!) 97.3 F (36.3 C)   Ht 5\' 1"  (1.549 m)   Wt 197 lb 3.2 oz (89.4 kg)   SpO2 94%   BMI 37.26 kg/m   Wt Readings from Last 3 Encounters:  02/10/21 197 lb 3.2 oz (89.4 kg)  11/20/20 192 lb  (87.1 kg)  10/16/20 194 lb (88 kg)    BP Readings from Last 3 Encounters:  02/10/21 131/85  11/20/20 (!) 158/92  10/16/20 (!) 144/80     Physical Exam Vitals and nursing note reviewed.  Constitutional:      Appearance: Normal appearance. She is obese.     Comments: Uses rollator walker   HENT:     Head: Normocephalic and atraumatic.     Right Ear: External ear normal.     Nose: Nose normal.     Mouth/Throat:     Mouth: Mucous membranes are moist.  Eyes:     Extraocular Movements: Extraocular movements intact.     Conjunctiva/sclera: Conjunctivae normal.     Pupils: Pupils are equal, round, and reactive to light.     Comments: Wears glasses   Cardiovascular:     Rate and Rhythm: Normal rate. Rhythm irregular.  Pulmonary:     Effort: Pulmonary effort is normal.     Breath sounds: Normal breath sounds.  Musculoskeletal:     Cervical back: Normal range of motion.     Right lower leg: Edema present.     Left lower leg: Edema present.  Skin:    Comments: 4 cm healed linear abrasion left scalp  Neurological:     General: No focal deficit present.     Mental Status: She is alert and oriented to person, place, and time. Mental status is at baseline.     Cranial Nerves: Cranial nerves are intact.     Sensory: Sensation is intact.     Motor: Motor function is intact. No weakness, tremor or pronator drift.     Coordination: Coordination is intact. Coordination normal.     Gait: Gait is intact. Gait normal.  Psychiatric:        Mood and Affect: Mood normal.        Behavior: Behavior normal.       Assessment & Plan:   Problem List Items Addressed This Visit       Cardiovascular and Mediastinum   Essential hypertension   Relevant Medications   aspirin EC 81 MG tablet   carvedilol (COREG) 3.125 MG tablet   furosemide (LASIX) 80 MG tablet   lisinopril (ZESTRIL) 20 MG tablet   pramipexole (MIRAPEX) 1 MG tablet     Endocrine   Type 2 diabetes mellitus with diabetic  neuropathy (HCC)   Relevant Medications  aspirin EC 81 MG tablet   insulin glargine (LANTUS SOLOSTAR) 100 UNIT/ML Solostar Pen   lisinopril (ZESTRIL) 20 MG tablet   metFORMIN (GLUCOPHAGE) 1000 MG tablet   Other Relevant Orders   Hemoglobin A1c (Completed)   Other Visit Diagnoses     Syncope and collapse    -  Primary   Relevant Orders   CT HEAD WO CONTRAST (5MM) (Completed)   RPR (Completed)   HIV Antibody (routine testing w rflx) (Completed)   Vitamin B12 (Completed)   TSH (Completed)   Comprehensive metabolic panel (Completed)   CBC with Differential/Platelet (Completed)   Abrasion of scalp, initial encounter       Frequent falls       Relevant Orders   CT HEAD WO CONTRAST (5MM) (Completed)   Memory loss       Relevant Orders   RPR (Completed)   HIV Antibody (routine testing w rflx) (Completed)   Vitamin B12 (Completed)   TSH (Completed)   Comprehensive metabolic panel (Completed)   CBC with Differential/Platelet (Completed)   Medication management       Hypertension associated with diabetes (HCC)       Relevant Medications   aspirin EC 81 MG tablet   carvedilol (COREG) 3.125 MG tablet   furosemide (LASIX) 80 MG tablet   insulin glargine (LANTUS SOLOSTAR) 100 UNIT/ML Solostar Pen   lisinopril (ZESTRIL) 20 MG tablet   metFORMIN (GLUCOPHAGE) 1000 MG tablet       Plan: -Face-to-face visit with me today for [place] Orders -She will definitely benefit from additional assistance at home as it is only her and her husband living there. Both her and her husband are having cognitive decline and fall risk issues. -New syncope with fall, unwitnessed, and head trauma - order for STAT CT head today -Medications sent for refill through [facility] today for memory changes, will treat accordingly -Will also check latest Ha1c  -BP is to goal, continue current regimen -She understands she needs to go to the ED in the event of any further syncope with  head trauma -Will also work on neurology referral   This note was prepared with assistance of Systems analyst. Occasional wrong-word or sound-a-like substitutions may have occurred due to the inherent limitations of voice recognition software.  Time Spent: 45 minutes of total time was spent on the date of the encounter performing the following actions: chart review prior to seeing the patient, obtaining history, performing a medically necessary exam, counseling on the treatment plan, placing orders, and documenting in our EHR.    Eliyas Suddreth M Reynold Mantell, PA-C

## 2021-02-10 NOTE — Chronic Care Management (AMB) (Signed)
  Chronic Care Management   Note  02/10/2021 Name: HAGAR SADIQ MRN: [id number] DOB: 1940/12/22  MIKIYA NEBERGALL is a 80 y.o. year old female who is a primary care patient of Allwardt, Alyssa M, PA-C. TRISHIA CUTHRELL is currently enrolled in care management services. An additional referral for LCSW was placed.   Follow up plan: Telephone appointment with care management team member scheduled for:02/14/2021  Noreene Larsson, [place], [place], [place] [address] Direct Dial: [phone] [email or web address]

## 2021-02-10 NOTE — Patient Instructions (Addendum)
Go to [name] - appointment at 4:15 today for CT of the head. [phone] [address]  [address]TR[address]  Labs in office, will call with results. Medications sent to [facility] for pill packaging.  Go to the Emergency Dept if any sudden severe headache, weakness, passing out, chest pain, shortness of breath, or other acute issues!

## 2021-02-11 ENCOUNTER — Other Ambulatory Visit: Payer: Self-pay

## 2021-02-11 ENCOUNTER — Ambulatory Visit: Payer: Medicare HMO | Admitting: *Deleted

## 2021-02-11 DIAGNOSIS — I5032 Chronic diastolic (congestive) heart failure: Secondary | ICD-10-CM

## 2021-02-11 DIAGNOSIS — I5033 Acute on chronic diastolic (congestive) heart failure: Secondary | ICD-10-CM

## 2021-02-11 DIAGNOSIS — E1140 Type 2 diabetes mellitus with diabetic neuropathy, unspecified: Secondary | ICD-10-CM

## 2021-02-11 DIAGNOSIS — Z794 Long term (current) use of insulin: Secondary | ICD-10-CM

## 2021-02-11 DIAGNOSIS — R55 Syncope and collapse: Secondary | ICD-10-CM

## 2021-02-11 LAB — RPR: RPR Ser Ql: NONREACTIVE

## 2021-02-11 LAB — HIV ANTIBODY (ROUTINE TESTING W REFLEX): HIV 1&2 Ab, 4th Generation: NONREACTIVE

## 2021-02-11 NOTE — Chronic Care Management (AMB) (Signed)
Chronic Care Management   CCM RN Visit Note  02/11/2021 Name: [name] MRN: [id number] DOB: 08/11/1940  Subjective: [name] is a 80 y.o. year old female who is a primary care patient of Allwardt, Alyssa M, PA-C. The care management team was consulted for assistance with disease management and care coordination needs.    Engaged with patient by telephone for follow up visit in response to provider referral for case management and/or care coordination services.   Consent to Services:  The patient was given information about Chronic Care Management services, agreed to services, and gave verbal consent prior to initiation of services.  Please see initial visit note for detailed documentation.   Patient agreed to services and verbal consent obtained.   Assessment: Review of patient past medical history, allergies, medications, health status, including review of consultants reports, laboratory and other test data, was performed as part of comprehensive evaluation and provision of chronic care management services.   SDOH (Social Determinants of Health) assessments and interventions performed:    CCM Care Plan  Allergies  Allergen Reactions   Sulfa Antibiotics Swelling     Facial Swelling "swelling lips"    Sulfonamide Derivatives Swelling    Lips Swelling   Other     BLOOD PRODUCT REFUSAL    Outpatient Encounter Medications as of 02/11/2021  Medication Sig   Ascorbic Acid (VITAMIN C PO) Take 2 tablets by mouth daily.   aspirin EC 81 MG tablet Take 1 tablet (81 mg total) by mouth daily. Swallow whole.   blood glucose meter kit and supplies Dispense based on patient and insurance preference. Use up to two  times daily as directed. (FOR ICD-10 E10.9, E11.9).   Calcium Citrate-Vitamin D (CALCIUM + D PO) Take 2 tablets by mouth daily.   carvedilol (COREG) 3.125 MG tablet Take 1 tablet (3.125 mg total) by mouth 2 (two) times daily with a meal.   Cholecalciferol (VITAMIN D3) 50  MCG (2000 UT) CHEW Chew 4,000 Units by mouth daily.   Cyanocobalamin (B-12 PO) Take 2 tablets by mouth daily.   furosemide (LASIX) 80 MG tablet Take 1 tablet (80 mg total) by mouth 3 (three) times daily.   HYDROcodone-acetaminophen (NORCO/VICODIN) 5-325 MG tablet Take 1 tablet by mouth 2 (two) times daily as needed for moderate pain.   insulin glargine (LANTUS SOLOSTAR) 100 UNIT/ML Solostar Pen Inject 25 Units into the skin at bedtime.   Insulin Pen Needle (DROPLET PEN NEEDLES) 32G X 4 MM MISC USE AS DIRECTED ONE TIME DAILY   lisinopril (ZESTRIL) 20 MG tablet Take 1 tablet (20 mg total) by mouth daily.   metFORMIN (GLUCOPHAGE) 1000 MG tablet Take 1 tablet (1,000 mg total) by mouth in the morning and at bedtime.   potassium chloride (KLOR-CON) 10 MEQ tablet TAKE 2 TABLETS THREE TIMES DAILY. TAKE WITH LASIX.   potassium chloride SA (KLOR-CON) 20 MEQ tablet Take 1 tablet (20 mEq total) by mouth 3 (three) times daily. Take with Lasix.   pramipexole (MIRAPEX) 1 MG tablet Take 1 tablet (1 mg total) by mouth 2 (two) times daily.   triamcinolone cream (KENALOG) 0.1 % Apply 1 application topically 2 (two) times daily as needed (redness). Apply to red areas on legs   No facility-administered encounter medications on file as of 02/11/2021.    Patient Active Problem List   Diagnosis Date Noted   Persistent atrial fibrillation ([place]) 09/26/2020   Educated about COVID-19 virus infection 11/30/2019   Chronic respiratory failure with hypoxia  and hypercapnia ([name]) 07/11/2019   Acute on chronic diastolic CHF (congestive heart failure) ([name]) 07/08/2019   Hypoxia 07/07/2019   Hypokalemia 05/03/2019   Facial asymmetry, acquired 08/20/2016   Fracture of knee prosthesis ([place]) 08/01/2016   Obesity 02/20/2016   Chronic pain syndrome 02/19/2016   Mild episode of recurrent major depressive disorder ([name]) 08/02/2015   Chronic diastolic congestive heart failure ([name]) 07/16/2015   Type 2 diabetes mellitus with  diabetic neuropathy ([name]) 09/27/2014   Hearing loss - has hearing aides, follow by audiologist 10/11/2013   No blood products ([facility])  09/11/2013   Disorder of bone and cartilage 06/10/2010   Breast cancer of upper-outer quadrant of right female breast ([name]) 04/04/2010   RESTLESS LEGS SYNDROME 05/05/2007   Obstructive sleep apnea 03/01/2007   Essential hypertension 01/27/2007   Osteoarthritis 01/27/2007    Conditions to be addressed/monitored:CHF, DMII, and Falls  Care Plan : Heart Failure (Adult)  Updates made by Leona Singleton, RN since 02/11/2021 12:00 AM     Problem: Symptom Exacerbation (Heart Failure)   Priority: Medium     Long-Range Goal: Symptom Exacerbation Prevented or Minimized   Start Date: 10/10/2020  Expected End Date: 05/16/2021  This Visit's Progress: On track  Recent Progress: On track  Priority: Medium  Note:   Current Barriers:  Knowledge deficit related to basic heart failure pathophysiology and self care management as evidenced by patient not able to verbalize when she needs to contact provider.  Does report weighing daily 191 with ranges of 189-193 pounds.  Denies any lower extremity swelling or increase in shortness of breath.  Patient reporting she had a fall last Thursday.  Unsure of the circumstances,  was walking down the hall and then woke up on the floor.  Has seen her PCP.  Continues working with home health nursing and therapy.  Reports pill packs have not been started; per [facility] they are to start 11/3 (home health nurse-Leslie updated).  In conversation does report a daughter who lives next door and hopes they will be able to assist with their care in th near future, Case Manager Clinical Goal(s):  patient will weigh self daily and record patient will verbalize understanding of Heart Failure Action Plan and when to call doctor patient will take all Heart Failure mediations as prescribed patient will weigh daily and record (notifying MD  of 3 lb weight gain over night or 5 lb in a week) Interventions:  Collaboration with Allwardt, Randa Evens, PA-C regarding development and update of comprehensive plan of care as evidenced by provider attestation and co-signature Inter-disciplinary care team collaboration (see longitudinal plan of care) Basic overview and discussion of pathophysiology of Heart Failure reviewed and encouraged to follow rescue plan if symptoms flare-up Provided verbal education on low sodium diet Reviewed Heart Failure Action Plan in depth  Discussed importance of daily weight and advised patient to weigh and record daily Reviewed role of diuretics in prevention of fluid overload and management of heart failure Barriers to lifestyle changes reviewed and addressed Barriers to treatment reviewed and addressed and healthy lifestyle promoted Self-awareness of signs/symptoms of worsening disease encouraged Attends all scheduled provider appointments Encouraged to eat more whole grains, fruits and vegetables, lean meats and healthy fats Discussed know when to call the doctor Assessed understanding of adherence and barriers to treatment plan, as well as lifestyle changes; develop strategies to address barriers Encouraged patient to review home care personal services list and to consider private paying for assistance at home  Discussed with patient home health versus personal care services and home maid assistance; encouraged patient/daughter to consider private pay personal aide Encouraged use of walker with all ambulation; discussed standing in front of recliner, if needing to stand for periods of time due to back pain RNCM verified with [facility] he is ob the list to start prepackaged meds; offered home health nurse extra pill boxes (decines) Discussed with patient importance of fall prevention Patient Goals/Self-Care Activities: Call office if I gain more than 2 pounds in one day or 5 pounds in one week Keep  legs up while sitting Weigh myself daily and track weight in diary Use salt in moderation Watch for swelling in feet, ankles and legs every day Fall precautions and prevention; use walker with all ambulation Check blood pressure and blood sugar during fall Work with home therapy Follow Up Plan: The care management team will reach out to the patient again over the next 45 business days.       Care Plan : Diabetes Type 2 (Adult)  Updates made by Leona Singleton, RN since 02/11/2021 12:00 AM     Problem: Glycemic Management (Diabetes, Type 2)   Priority: Medium     Long-Range Goal: Patient will decrease Hgb A1c of below 7 within the next 90 days   Start Date: 10/10/2020  Expected End Date: 06/17/2021  This Visit's Progress: Not on track  Recent Progress: On track  Priority: Medium  Note:   Objective:  Lab Results  Component Value Date   HGBA1C 7.4 (H) 02/10/2021  Current Barriers:  Knowledge Deficits related to basic Diabetes pathophysiology and self care/management  Has no checked blood sugar, with recent ranges of 100-132.  Patient reporting she had a fall last Thursday.  Unsure of the circumstances,  was walking down the hall and then woke up on the floor.  Has seen her PCP.  Continues working with home health nursing and therapy.  Reports pill packs have not been started; per [facility] they are to start 11/3 (home health nurse-Leslie updated).  In conversation does report a daughter who lives next door and hopes they will be able to assist with their care in th near future Case Manager Clinical Goal(s):  patient will demonstrate improved adherence to prescribed treatment plan for diabetes self care/management as evidenced by: adherence to ADA/ carb modified diet adherence to prescribed medication regimen Interventions:  Collaboration with Allwardt, Randa Evens, PA-C regarding development and update of comprehensive plan of care as evidenced by provider attestation and  co-signature Inter-disciplinary care team collaboration (see longitudinal plan of care) Provided education to patient about basic DM disease process Reviewed medications with patient and discussed importance of medication adherence Discussed plans with patient for ongoing care management follow up and provided patient with direct contact information for care management team Provided patient with educational materials related to hypo and hyperglycemia and importance of correct treatment Barriers to adherence to treatment plan identified Blood glucose monitoring encouraged Self-awareness of signs/symptoms of hypo or hyperglycemia encouraged Encouraged to attend all scheduled provider appointments Reviewed mutually-set A1C goal [facility] Living Well with Diabetes Educational Packet and encouraged patient to review Confirmed patient still has [place] number [phone]) Emotional support and empathy provided to patient Encouraged and instructed patient to check blood sugars at least twice a day while on insulin injections Encouraged to continue to work with CCM Pharmacist to help with possible pill packs for patient and possible medication compliance [facility] Nurse-Leslie  Discussed  with patient possible [place] versus ALF (declines at this time but encouraged to discuss with daughter) Congratulated on current A1C and discussed ways to help reduce Patient Goals/Self-Care Activities: Set target A1C 7 (current 6.9) Check blood sugars 2 times a day and document in log Discuss pill pack with CCM Pharmacist Work with home health nursing and therapist Follow Up Plan: The care management team will reach out to the patient again over the next 30 business days.        Plan:The care management team will reach out to the patient again over the next 30 business days.  Hubert Azure RN, MSN RN Care Management Coordinator  [facility] [phone] [email or web address]

## 2021-02-11 NOTE — Patient Instructions (Signed)
Visit Information  PATIENT GOALS:  Goals Addressed             This Visit's Progress    (RNCM) Set My Target A1C-Diabetes Type 2   On track    Timeframe:  Long-Range Goal Priority:  Medium Start Date:   10/10/20                          Expected End Date:  05/16/21                     Follow Up Date 02/11/21    Set target A1C 7 (current 6.9) Check blood sugars 2 times a day and document in log Discuss pill pack with CCM Pharmacist Work with home health nursing and therapist   Why is this important?   Your target A1C is decided together by you and your doctor.  It is based on several things like your age and other health issues.    Notes:      (RNCM) Track and Manage Fluids and Swelling-Heart Failure   Not on track    Timeframe:  Long-Range Goal Priority:  Medium Start Date:   10/10/20                          Expected End Date: 05/16/21                    Follow Up Date  03/04/21    Call office if I gain more than 2 pounds in one day or 5 pounds in one week Keep legs up while sitting Weigh myself daily and track weight in diary Use salt in moderation Watch for swelling in feet, ankles and legs every day Fall precautions and prevention; use walker with all ambulation Check blood pressure and blood sugar during fall Work with home therapy   Why is this important?   It is important to check your weight daily and watch how much salt and liquids you have.  It will help you to manage your heart failure.    Notes:         Patient verbalizes understanding of instructions provided today and agrees to view in [place].   The care management team will reach out to the patient again over the next 30 business days.   Hubert Azure RN, MSN RN Care Management Coordinator  [facility] [phone] [email or web address]

## 2021-02-12 ENCOUNTER — Telehealth: Payer: Self-pay

## 2021-02-12 NOTE — Telephone Encounter (Signed)
Pt returned a call about labs. Please Advise.

## 2021-02-13 DIAGNOSIS — G4730 Sleep apnea, unspecified: Secondary | ICD-10-CM | POA: Diagnosis not present

## 2021-02-13 DIAGNOSIS — G5602 Carpal tunnel syndrome, left upper limb: Secondary | ICD-10-CM | POA: Diagnosis not present

## 2021-02-13 DIAGNOSIS — E119 Type 2 diabetes mellitus without complications: Secondary | ICD-10-CM | POA: Diagnosis not present

## 2021-02-13 DIAGNOSIS — I110 Hypertensive heart disease with heart failure: Secondary | ICD-10-CM | POA: Diagnosis not present

## 2021-02-13 DIAGNOSIS — F419 Anxiety disorder, unspecified: Secondary | ICD-10-CM | POA: Diagnosis not present

## 2021-02-13 DIAGNOSIS — M159 Polyosteoarthritis, unspecified: Secondary | ICD-10-CM | POA: Diagnosis not present

## 2021-02-13 DIAGNOSIS — H9193 Unspecified hearing loss, bilateral: Secondary | ICD-10-CM | POA: Diagnosis not present

## 2021-02-13 DIAGNOSIS — F329 Major depressive disorder, single episode, unspecified: Secondary | ICD-10-CM | POA: Diagnosis not present

## 2021-02-13 DIAGNOSIS — I5030 Unspecified diastolic (congestive) heart failure: Secondary | ICD-10-CM | POA: Diagnosis not present

## 2021-02-13 NOTE — Telephone Encounter (Signed)
Patient notified

## 2021-02-14 ENCOUNTER — Ambulatory Visit: Payer: Medicare HMO | Admitting: *Deleted

## 2021-02-14 DIAGNOSIS — I10 Essential (primary) hypertension: Secondary | ICD-10-CM | POA: Diagnosis not present

## 2021-02-14 DIAGNOSIS — E1140 Type 2 diabetes mellitus with diabetic neuropathy, unspecified: Secondary | ICD-10-CM

## 2021-02-14 DIAGNOSIS — J9612 Chronic respiratory failure with hypercapnia: Secondary | ICD-10-CM

## 2021-02-14 DIAGNOSIS — J9611 Chronic respiratory failure with hypoxia: Secondary | ICD-10-CM

## 2021-02-14 DIAGNOSIS — R413 Other amnesia: Secondary | ICD-10-CM

## 2021-02-14 DIAGNOSIS — I5033 Acute on chronic diastolic (congestive) heart failure: Secondary | ICD-10-CM

## 2021-02-14 DIAGNOSIS — M159 Polyosteoarthritis, unspecified: Secondary | ICD-10-CM

## 2021-02-14 DIAGNOSIS — R296 Repeated falls: Secondary | ICD-10-CM

## 2021-02-14 DIAGNOSIS — I152 Hypertension secondary to endocrine disorders: Secondary | ICD-10-CM

## 2021-02-14 DIAGNOSIS — E1159 Type 2 diabetes mellitus with other circulatory complications: Secondary | ICD-10-CM | POA: Diagnosis not present

## 2021-02-14 DIAGNOSIS — I5032 Chronic diastolic (congestive) heart failure: Secondary | ICD-10-CM | POA: Diagnosis not present

## 2021-02-14 DIAGNOSIS — Z794 Long term (current) use of insulin: Secondary | ICD-10-CM

## 2021-02-15 NOTE — Chronic Care Management (AMB) (Signed)
Chronic Care Management    Clinical Social Work Note  02/15/2021 Name: [name] MRN: [id number] DOB: 10/30/1940  [name] is a 80 y.o. year old female who is a primary care patient of Allwardt, Alyssa M, PA-C. The CCM team was consulted to assist the patient with chronic disease management and/or care coordination needs related to: Transportation Needs, [employer], Food Insecurity, Level of Care Concerns, and Caregiver Stress.   Engaged with patient by telephone for initial visit in response to provider referral for social work chronic care management and care coordination services.   Consent to Services:  The patient was given information about Chronic Care Management services, agreed to services, and gave verbal consent prior to initiation of services.  Please see initial visit note for detailed documentation.   Patient agreed to services and consent obtained.   Assessment: Review of patient past medical history, allergies, medications, and health status, including review of relevant consultants reports was performed today as part of a comprehensive evaluation and provision of chronic care management and care coordination services.     SDOH (Social Determinants of Health) assessments and interventions performed:  SDOH Interventions    Flowsheet Row Most Recent Value  SDOH Interventions   Food Insecurity Interventions Intervention Not Indicated  Financial Strain Interventions Intervention Not Indicated  Housing Interventions Intervention Not Indicated  Intimate Partner Violence Interventions Intervention Not Indicated  Physical Activity Interventions Patient Refused  Stress Interventions Intervention Not Indicated, Offered [employer] Resources, Provide Counseling  Social Connections Interventions Intervention Not Indicated  Transportation Interventions Intervention Not Indicated        Advanced Directives Status: See Care Plan for related entries.  CCM  Care Plan  Allergies  Allergen Reactions   Sulfa Antibiotics Swelling     Facial Swelling "swelling lips"    Sulfonamide Derivatives Swelling    Lips Swelling   Other     BLOOD PRODUCT REFUSAL    Outpatient Encounter Medications as of 02/14/2021  Medication Sig   Ascorbic Acid (VITAMIN C PO) Take 2 tablets by mouth daily.   aspirin EC 81 MG tablet Take 1 tablet (81 mg total) by mouth daily. Swallow whole.   blood glucose meter kit and supplies Dispense based on patient and insurance preference. Use up to two  times daily as directed. (FOR ICD-10 E10.9, E11.9).   Calcium Citrate-Vitamin D (CALCIUM + D PO) Take 2 tablets by mouth daily.   carvedilol (COREG) 3.125 MG tablet Take 1 tablet (3.125 mg total) by mouth 2 (two) times daily with a meal.   Cholecalciferol (VITAMIN D3) 50 MCG (2000 UT) CHEW Chew 4,000 Units by mouth daily.   Cyanocobalamin (B-12 PO) Take 2 tablets by mouth daily.   furosemide (LASIX) 80 MG tablet Take 1 tablet (80 mg total) by mouth 3 (three) times daily.   HYDROcodone-acetaminophen (NORCO/VICODIN) 5-325 MG tablet Take 1 tablet by mouth 2 (two) times daily as needed for moderate pain.   insulin glargine (LANTUS SOLOSTAR) 100 UNIT/ML Solostar Pen Inject 25 Units into the skin at bedtime.   Insulin Pen Needle (DROPLET PEN NEEDLES) 32G X 4 MM MISC USE AS DIRECTED ONE TIME DAILY   lisinopril (ZESTRIL) 20 MG tablet Take 1 tablet (20 mg total) by mouth daily.   metFORMIN (GLUCOPHAGE) 1000 MG tablet Take 1 tablet (1,000 mg total) by mouth in the morning and at bedtime.   potassium chloride (KLOR-CON) 10 MEQ tablet TAKE 2 TABLETS THREE TIMES DAILY. TAKE WITH LASIX.   potassium  chloride SA (KLOR-CON) 20 MEQ tablet Take 1 tablet (20 mEq total) by mouth 3 (three) times daily. Take with Lasix.   pramipexole (MIRAPEX) 1 MG tablet Take 1 tablet (1 mg total) by mouth 2 (two) times daily.   triamcinolone cream (KENALOG) 0.1 % Apply 1 application topically 2 (two) times daily as  needed (redness). Apply to red areas on legs   No facility-administered encounter medications on file as of 02/14/2021.    Patient Active Problem List   Diagnosis Date Noted   Persistent atrial fibrillation ([place]) 09/26/2020   Educated about COVID-19 virus infection 11/30/2019   Chronic respiratory failure with hypoxia and hypercapnia ([place]) 07/11/2019   Acute on chronic diastolic CHF (congestive heart failure) ([place]) 07/08/2019   Hypoxia 07/07/2019   Hypokalemia 05/03/2019   Facial asymmetry, acquired 08/20/2016   Fracture of knee prosthesis ([place]) 08/01/2016   Obesity 02/20/2016   Chronic pain syndrome 02/19/2016   Mild episode of recurrent major depressive disorder ([place]) 08/02/2015   Chronic diastolic congestive heart failure ([place]) 07/16/2015   Type 2 diabetes mellitus with diabetic neuropathy ([place]) 09/27/2014   Hearing loss - has hearing aides, follow by audiologist 10/11/2013   No blood products ([place])  09/11/2013   Disorder of bone and cartilage 06/10/2010   Breast cancer of upper-outer quadrant of right female breast ([place]) 04/04/2010   RESTLESS LEGS SYNDROME 05/05/2007   Obstructive sleep apnea 03/01/2007   Essential hypertension 01/27/2007   Osteoarthritis 01/27/2007    Conditions to be addressed/monitored: HTN and DMII.  Limited Social Support, Transportation, Limited Access to [employer], Level of Care Concerns, ADL/IADL Limitations, Social Isolation, Limited Access to Caregiver, Cognitive Deficits, Memory Deficits, and Lacks Knowledge of [employer].  Care Plan : LCSW Plan of Care  Updates made by Francis Gaines, LCSW since 02/15/2021 12:00 AM     Problem: Maintain My Quality of Life.   Priority: High     Goal: Maintain My Quality of Life.   Start Date: 02/14/2021  Expected End Date: 04/16/2021  This Visit's Progress: On track  Priority: High  Note:   Current Barriers:   Patient in need of assistance with connecting to community resources for  Transportation, Limited Access to [employer] and Limited Access to Caregiver. Patient acknowledges deficits and admits to being unable to independently navigate community resource options without care coordination support. Clinical Goals:  Explore community resource options for unmet needs related to:  SDOH: Transportation, Food Insecurities, Limited Access to Caregiver, and patient will work with [employer] and [employer] Guides to address needs. Clinical Interventions:  Collaboration with Allwardt, Randa Evens, PA-C regarding development and update of comprehensive plan of care as evidenced by provider attestation and co-signature. Inter-disciplinary care team collaboration (see longitudinal plan of care). Assessment of needs, barriers, agencies contacted, as well as how impacting.  Reviewed various resources, discussed options and provided patient with information about Transportation, Tourist information centre manager and [employer] .  Level of Care Concerns, ADL/IADL Limitations, Social Isolation, Limited Access to Caregiver, Cognitive Deficits and Memory Deficits discussed with patient and resources provided.   Patient interviewed and appropriate assessments performed. Referred patient to [facility] Guides for assistance with Transportation, Food Insecurities and Limited Access to Caregiver.   Assisted patient with obtaining information about health plan benefits through [facility]. Provided education to patient regarding level of care options. Other interventions provided: Depression screen reviewed  PHQ2/ PHQ9 completed Solution-Focused Strategies Active listening/Reflection utilized  Emotional Support Provided Problem [facility]  Quality of sleep  assessed & sleep hygiene techniques promoted  Caregiver stress acknowledged  Consideration of in-home help encouraged  Verbalization of feelings encouraged  Patient Goals:  Accept all calls from [place] in an effort to  receive assistance and resources with obtaining transportation, prepared meal delivery services and in-home aide services. Contact LCSW directly (# [id number]) if you have questions, need assistance, or if additional social work needs are identified between now and our next scheduled telephone outreach call. Follow Up Plan:  CSW will follow-up with patient by phone on 02/28/2021 at 9:15am   [place] Clinical Social Worker [facility] [place] [phone]

## 2021-02-15 NOTE — Patient Instructions (Signed)
Visit Information   PATIENT GOALS:   Goals Addressed               This Visit's Progress     Maintain My Quality of Life. (pt-stated)   On track     Timeframe:  Short-Term Goal Priority:  High Start Date:    02/14/2021                         Expected End Date:  04/16/2021                  Follow-Up Date:  02/28/2021 at 9:15am  Patient Goals:  Accept all calls from [place] in an effort to receive assistance and resources with obtaining transportation, prepared meal delivery services and in-home aide services. Contact LCSW directly (# [id number]) if you have questions, need assistance, or if additional social work needs are identified between now and our next scheduled telephone outreach call.        Consent to CCM Services: Ms. Grinnell was given information about Chronic Care Management services including:  CCM service includes personalized support from designated clinical staff supervised by her physician, including individualized plan of care and coordination with other care providers 24/7 contact phone numbers for assistance for urgent and routine care needs. Service will only be billed when office clinical staff spend 20 minutes or more in a month to coordinate care. Only one practitioner may furnish and bill the service in a calendar month. The patient may stop CCM services at any time (effective at the end of the month) by phone call to the office staff. The patient will be responsible for cost sharing (co-pay) of up to 20% of the service fee (after annual deductible is met).  Patient agreed to services and verbal consent obtained.   Patient verbalizes understanding of instructions provided today and agrees to view in [place].   Telephone follow up appointment with care management team member scheduled for:  02/28/2021 at 9:15am  Nat Christen LCSW Licensed Clinical Social Worker [place] [phone]   CLINICAL CARE PLAN: Patient  Care Plan: Heart Failure (Adult)     Problem Identified: Symptom Exacerbation (Heart Failure)   Priority: Medium     Long-Range Goal: Symptom Exacerbation Prevented or Minimized   Start Date: 10/10/2020  Expected End Date: 05/16/2021  This Visit's Progress: On track  Recent Progress: On track  Priority: Medium  Note:   Current Barriers:  Knowledge deficit related to basic heart failure pathophysiology and self care management as evidenced by patient not able to verbalize when she needs to contact provider.  Does report weighing daily 191 with ranges of 189-193 pounds.  Denies any lower extremity swelling or increase in shortness of breath.  Patient reporting she had a fall last Thursday.  Unsure of the circumstances,  was walking down the hall and then woke up on the floor.  Has seen her PCP.  Continues working with home health nursing and therapy.  Reports pill packs have not been started; per [facility] they are to start 11/3 (home health nurse-Leslie updated).  In conversation does report a daughter who lives next door and hopes they will be able to assist with their care in th near future, Case Manager Clinical Goal(s):  patient will weigh self daily and record patient will verbalize understanding of Heart Failure Action Plan and when to call doctor patient will take all Heart Failure mediations as prescribed patient will  weigh daily and record (notifying MD of 3 lb weight gain over night or 5 lb in a week) Interventions:  Collaboration with Allwardt, Randa Evens, PA-C regarding development and update of comprehensive plan of care as evidenced by provider attestation and co-signature Inter-disciplinary care team collaboration (see longitudinal plan of care) Basic overview and discussion of pathophysiology of Heart Failure reviewed and encouraged to follow rescue plan if symptoms flare-up Provided verbal education on low sodium diet Reviewed Heart Failure Action Plan in depth  Discussed  importance of daily weight and advised patient to weigh and record daily Reviewed role of diuretics in prevention of fluid overload and management of heart failure Barriers to lifestyle changes reviewed and addressed Barriers to treatment reviewed and addressed and healthy lifestyle promoted Self-awareness of signs/symptoms of worsening disease encouraged Attends all scheduled provider appointments Encouraged to eat more whole grains, fruits and vegetables, lean meats and healthy fats Discussed know when to call the doctor Assessed understanding of adherence and barriers to treatment plan, as well as lifestyle changes; develop strategies to address barriers Encouraged patient to review home care personal services list and to consider private paying for assistance at home Discussed with patient home health versus personal care services and home maid assistance; encouraged patient/daughter to consider private pay personal aide Encouraged use of walker with all ambulation; discussed standing in front of recliner, if needing to stand for periods of time due to back pain RNCM verified with [facility] he is ob the list to start prepackaged meds; offered home health nurse extra pill boxes (decines) Discussed with patient importance of fall prevention Patient Goals/Self-Care Activities: Call office if I gain more than 2 pounds in one day or 5 pounds in one week Keep legs up while sitting Weigh myself daily and track weight in diary Use salt in moderation Watch for swelling in feet, ankles and legs every day Fall precautions and prevention; use walker with all ambulation Check blood pressure and blood sugar during fall Work with home therapy Follow Up Plan: The care management team will reach out to the patient again over the next 45 business days.       Patient Care Plan: Diabetes Type 2 (Adult)     Problem Identified: Glycemic Management (Diabetes, Type 2)   Priority: Medium      Long-Range Goal: Patient will decrease Hgb A1c of below 7 within the next 90 days   Start Date: 10/10/2020  Expected End Date: 06/17/2021  This Visit's Progress: Not on track  Recent Progress: On track  Priority: Medium  Note:   Objective:  Lab Results  Component Value Date   HGBA1C 7.4 (H) 02/10/2021  Current Barriers:  Knowledge Deficits related to basic Diabetes pathophysiology and self care/management  Has no checked blood sugar, with recent ranges of 100-132.  Patient reporting she had a fall last Thursday.  Unsure of the circumstances,  was walking down the hall and then woke up on the floor.  Has seen her PCP.  Continues working with home health nursing and therapy.  Reports pill packs have not been started; per [facility] they are to start 11/3 (home health nurse-Leslie updated).  In conversation does report a daughter who lives next door and hopes they will be able to assist with their care in th near future Case Manager Clinical Goal(s):  patient will demonstrate improved adherence to prescribed treatment plan for diabetes self care/management as evidenced by: adherence to ADA/ carb modified diet adherence to prescribed medication regimen Interventions:  Collaboration with Allwardt, Randa Evens, PA-C regarding development and update of comprehensive plan of care as evidenced by provider attestation and co-signature Inter-disciplinary care team collaboration (see longitudinal plan of care) Provided education to patient about basic DM disease process Reviewed medications with patient and discussed importance of medication adherence Discussed plans with patient for ongoing care management follow up and provided patient with direct contact information for care management team Provided patient with educational materials related to hypo and hyperglycemia and importance of correct treatment Barriers to adherence to treatment plan identified Blood glucose monitoring encouraged Self-awareness  of signs/symptoms of hypo or hyperglycemia encouraged Encouraged to attend all scheduled provider appointments Reviewed mutually-set A1C goal Sent Living Well with Diabetes Educational Packet and encouraged patient to review Confirmed patient still has No Name number [phone]) Emotional support and empathy provided to patient Encouraged and instructed patient to check blood sugars at least twice a day while on insulin injections Encouraged to continue to work with CCM Pharmacist to help with possible pill packs for patient and possible medication compliance [place] Nurse-Leslie  Discussed with patient possible [place] versus ALF (declines at this time but encouraged to discuss with daughter) Congratulated on current A1C and discussed ways to help reduce Patient Goals/Self-Care Activities: Set target A1C 7 (current 6.9) Check blood sugars 2 times a day and document in log Discuss pill pack with CCM Pharmacist Work with home health nursing and therapist Follow Up Plan: The care management team will reach out to the patient again over the next 30 business days.       Patient Care Plan: General Pharmacy (Adult)     Problem Identified: CHL AMB "PATIENT-SPECIFIC PROBLEM"      Long-Range Goal: Patient-Specific Goal   Start Date: 11/13/2020  Expected End Date: 11/13/2021  Recent Progress: On track  Priority: High  Note:   Current Barriers:  Unable to self administer medications as prescribed  Pharmacist Clinical Goal(s):  Patient will contact provider office for questions/concerns as evidenced notation of same in electronic health record through collaboration with PharmD and provider.   Interventions: 1:1 collaboration with Allwardt, Randa Evens, PA-C regarding development and update of comprehensive plan of care as evidenced by provider attestation and co-signature Inter-disciplinary care team collaboration (see longitudinal plan of  care) Comprehensive medication review performed; medication list updated in electronic medical record  Hypertension (BP goal <130/80) -Controlled -Current treatment: Carvedilol 3.125 mg twice daily  Lisinopril 20 mg once daily Furosemide 80 mg three times daily   -Current home readings: none provided, does have cuff and will provide/ask for asssitance using when next seen  -Denies hypotensive/hypertensive symptoms -Educated on Symptoms of hypotension and importance of maintaining adequate hydration; -Counseled to monitor BP at home 1-2x/week in addition to daily weights, document, and provide log at future appointments -Recommended to continue current medication  Update 01/21/21 Reports BP around 130/80. Denies any recent dizziness or HA's. Stated she just took a home COVID-19 test and it was positive.  Continue to monitor for now.  Diabetes (A1c goal <7%) -Controlled -Has not located blood glucose meter, is open to receiving meter now. -Current medications: Metformin 1000 mg twice daily Lantus 25 units into the skin at bedtime  -Current home glucose readings fasting glucose: is not testing -Denies hypoglycemic/hyperglycemic symptoms -Current exercise: no formal exercise -Educated on A1c and blood sugar goals; Prevention and management of hypoglycemic episodes; Benefits of routine self-monitoring of blood sugar; -Recommended to continue current medication Will order new BG monitor  after review  Update 01/21/21 Fasting this morning @ 5am - 110 Highest reported sugar around 150 She has gotten new meter and has just started checking her sugars at home Denies hypoglycemia  Continue current meds for now.  Pill packs to help with adherence  Patient Goals/Self-Care Activities Patient will:  - focus on medication adherence by using pill packaging.  Setting up with [facility] pill packs for adherence and ease of medication administration.  Follow Up Plan: CMA fu 30 days on  adherence    Patient Care Plan: LCSW Plan of Care     Problem Identified: Maintain My Quality of Life.   Priority: High     Goal: Maintain My Quality of Life.   Start Date: 02/14/2021  Expected End Date: 04/16/2021  This Visit's Progress: On track  Priority: High  Note:   Current Barriers:   Patient in need of assistance with connecting to community resources for Transportation, Limited Access to [employer] and Limited Access to Caregiver. Patient acknowledges deficits and admits to being unable to independently navigate community resource options without care coordination support. Clinical Goals:  Explore community resource options for unmet needs related to:  SDOH: Transportation, Food Insecurities, Limited Access to Caregiver, and patient will work with [employer] and [employer] Guides to address needs. Clinical Interventions:  Collaboration with Allwardt, Randa Evens, PA-C regarding development and update of comprehensive plan of care as evidenced by provider attestation and co-signature. Inter-disciplinary care team collaboration (see longitudinal plan of care). Assessment of needs, barriers, agencies contacted, as well as how impacting.  Reviewed various resources, discussed options and provided patient with information about Transportation, Tourist information centre manager and [employer] .  Level of Care Concerns, ADL/IADL Limitations, Social Isolation, Limited Access to Caregiver, Cognitive Deficits and Memory Deficits discussed with patient and resources provided.   Patient interviewed and appropriate assessments performed. Referred patient to [facility] Guides for assistance with Transportation, Food Insecurities and Limited Access to Caregiver.   Assisted patient with obtaining information about health plan benefits through [facility]. Provided education to patient regarding level of care options. Other interventions provided: Depression screen reviewed  PHQ2/ PHQ9  completed Solution-Focused Strategies Active listening/Reflection utilized  Emotional Support Provided Problem Solving/Task Center  Quality of sleep assessed & sleep hygiene techniques promoted  Caregiver stress acknowledged  Consideration of in-home help encouraged  Verbalization of feelings encouraged  Patient Goals:  Accept all calls from [place] in an effort to receive assistance and resources with obtaining transportation, prepared meal delivery services and in-home aide services. Contact LCSW directly (# [id number]) if you have questions, need assistance, or if additional social work needs are identified between now and our next scheduled telephone outreach call. Follow Up Plan:  CSW will follow-up with patient by phone on 02/28/2021 at 9:15am

## 2021-02-17 DIAGNOSIS — M159 Polyosteoarthritis, unspecified: Secondary | ICD-10-CM | POA: Diagnosis not present

## 2021-02-17 DIAGNOSIS — E119 Type 2 diabetes mellitus without complications: Secondary | ICD-10-CM | POA: Diagnosis not present

## 2021-02-17 DIAGNOSIS — H9193 Unspecified hearing loss, bilateral: Secondary | ICD-10-CM | POA: Diagnosis not present

## 2021-02-17 DIAGNOSIS — F419 Anxiety disorder, unspecified: Secondary | ICD-10-CM | POA: Diagnosis not present

## 2021-02-17 DIAGNOSIS — I110 Hypertensive heart disease with heart failure: Secondary | ICD-10-CM | POA: Diagnosis not present

## 2021-02-17 DIAGNOSIS — G4730 Sleep apnea, unspecified: Secondary | ICD-10-CM | POA: Diagnosis not present

## 2021-02-17 DIAGNOSIS — I5030 Unspecified diastolic (congestive) heart failure: Secondary | ICD-10-CM | POA: Diagnosis not present

## 2021-02-17 DIAGNOSIS — G5602 Carpal tunnel syndrome, left upper limb: Secondary | ICD-10-CM | POA: Diagnosis not present

## 2021-02-17 DIAGNOSIS — F329 Major depressive disorder, single episode, unspecified: Secondary | ICD-10-CM | POA: Diagnosis not present

## 2021-02-18 ENCOUNTER — Telehealth: Payer: Self-pay | Admitting: *Deleted

## 2021-02-18 NOTE — Telephone Encounter (Signed)
   Telephone encounter was:  Unsuccessful.  02/18/2021 Name: KARIMAH WINQUIST MRN: [id number] DOB: 02-May-1941  Unsuccessful outbound call made today to assist with:  Food Insecurity and Caregiver Stress  Outreach Attempt:  1st Attempt  A HIPAA compliant voice message was left requesting a return call.  Instructed patient to call back at   Instructed patient to call back at [phone]  at their earliest convenience. .  [place], Care Management  [phone] [address] , [place] [address] Email : [name]

## 2021-02-19 ENCOUNTER — Encounter: Payer: Self-pay | Admitting: Neurology

## 2021-02-20 DIAGNOSIS — I5030 Unspecified diastolic (congestive) heart failure: Secondary | ICD-10-CM | POA: Diagnosis not present

## 2021-02-20 DIAGNOSIS — I110 Hypertensive heart disease with heart failure: Secondary | ICD-10-CM | POA: Diagnosis not present

## 2021-02-20 DIAGNOSIS — G5602 Carpal tunnel syndrome, left upper limb: Secondary | ICD-10-CM | POA: Diagnosis not present

## 2021-02-20 DIAGNOSIS — H9193 Unspecified hearing loss, bilateral: Secondary | ICD-10-CM | POA: Diagnosis not present

## 2021-02-20 DIAGNOSIS — G4730 Sleep apnea, unspecified: Secondary | ICD-10-CM | POA: Diagnosis not present

## 2021-02-20 DIAGNOSIS — F419 Anxiety disorder, unspecified: Secondary | ICD-10-CM | POA: Diagnosis not present

## 2021-02-20 DIAGNOSIS — F329 Major depressive disorder, single episode, unspecified: Secondary | ICD-10-CM | POA: Diagnosis not present

## 2021-02-20 DIAGNOSIS — M159 Polyosteoarthritis, unspecified: Secondary | ICD-10-CM | POA: Diagnosis not present

## 2021-02-20 DIAGNOSIS — E119 Type 2 diabetes mellitus without complications: Secondary | ICD-10-CM | POA: Diagnosis not present

## 2021-02-24 DIAGNOSIS — H9193 Unspecified hearing loss, bilateral: Secondary | ICD-10-CM | POA: Diagnosis not present

## 2021-02-24 DIAGNOSIS — Z853 Personal history of malignant neoplasm of breast: Secondary | ICD-10-CM

## 2021-02-24 DIAGNOSIS — Z7901 Long term (current) use of anticoagulants: Secondary | ICD-10-CM

## 2021-02-24 DIAGNOSIS — M159 Polyosteoarthritis, unspecified: Secondary | ICD-10-CM | POA: Diagnosis not present

## 2021-02-24 DIAGNOSIS — Z7984 Long term (current) use of oral hypoglycemic drugs: Secondary | ICD-10-CM

## 2021-02-24 DIAGNOSIS — G5602 Carpal tunnel syndrome, left upper limb: Secondary | ICD-10-CM | POA: Diagnosis not present

## 2021-02-24 DIAGNOSIS — E669 Obesity, unspecified: Secondary | ICD-10-CM

## 2021-02-24 DIAGNOSIS — F419 Anxiety disorder, unspecified: Secondary | ICD-10-CM | POA: Diagnosis not present

## 2021-02-24 DIAGNOSIS — G2581 Restless legs syndrome: Secondary | ICD-10-CM

## 2021-02-24 DIAGNOSIS — E119 Type 2 diabetes mellitus without complications: Secondary | ICD-10-CM | POA: Diagnosis not present

## 2021-02-24 DIAGNOSIS — Z96653 Presence of artificial knee joint, bilateral: Secondary | ICD-10-CM

## 2021-02-24 DIAGNOSIS — I5030 Unspecified diastolic (congestive) heart failure: Secondary | ICD-10-CM | POA: Diagnosis not present

## 2021-02-24 DIAGNOSIS — G4730 Sleep apnea, unspecified: Secondary | ICD-10-CM | POA: Diagnosis not present

## 2021-02-24 DIAGNOSIS — I110 Hypertensive heart disease with heart failure: Secondary | ICD-10-CM | POA: Diagnosis not present

## 2021-02-24 DIAGNOSIS — F329 Major depressive disorder, single episode, unspecified: Secondary | ICD-10-CM | POA: Diagnosis not present

## 2021-02-24 DIAGNOSIS — Z9181 History of falling: Secondary | ICD-10-CM

## 2021-02-24 DIAGNOSIS — Z794 Long term (current) use of insulin: Secondary | ICD-10-CM

## 2021-02-24 DIAGNOSIS — I5032 Chronic diastolic (congestive) heart failure: Secondary | ICD-10-CM

## 2021-02-25 ENCOUNTER — Ambulatory Visit: Payer: Medicare HMO

## 2021-02-25 ENCOUNTER — Telehealth: Payer: Self-pay

## 2021-02-25 DIAGNOSIS — Z79891 Long term (current) use of opiate analgesic: Secondary | ICD-10-CM | POA: Diagnosis not present

## 2021-02-25 NOTE — Telephone Encounter (Signed)
Received a call from [facility], they state they faxed over a home health order that needed to be signed and has not had a response. Did have them re-fax the orders.

## 2021-02-26 ENCOUNTER — Telehealth: Payer: Self-pay | Admitting: *Deleted

## 2021-02-26 ENCOUNTER — Ambulatory Visit: Payer: Medicare HMO

## 2021-02-26 NOTE — Telephone Encounter (Signed)
   Telephone encounter was:  Unsuccessful.  02/26/2021 Name: MINDI AKERSON MRN: [id number] DOB: Apr 29, 1941  Unsuccessful outbound call made today to assist with:  Transportation Needs  and Food Insecurity  Outreach Attempt:  2nd Attempt  A HIPAA compliant voice message was left requesting a return call.  Instructed patient to call back at   Instructed patient to call back at [phone]  at their earliest convenience.         [place], Care Management  [phone] [address] , [place] [address] Email : [name]  .

## 2021-02-27 DIAGNOSIS — F419 Anxiety disorder, unspecified: Secondary | ICD-10-CM | POA: Diagnosis not present

## 2021-02-27 DIAGNOSIS — E119 Type 2 diabetes mellitus without complications: Secondary | ICD-10-CM | POA: Diagnosis not present

## 2021-02-27 DIAGNOSIS — M159 Polyosteoarthritis, unspecified: Secondary | ICD-10-CM | POA: Diagnosis not present

## 2021-02-27 DIAGNOSIS — G4730 Sleep apnea, unspecified: Secondary | ICD-10-CM | POA: Diagnosis not present

## 2021-02-27 DIAGNOSIS — I5030 Unspecified diastolic (congestive) heart failure: Secondary | ICD-10-CM | POA: Diagnosis not present

## 2021-02-27 DIAGNOSIS — I110 Hypertensive heart disease with heart failure: Secondary | ICD-10-CM | POA: Diagnosis not present

## 2021-02-27 DIAGNOSIS — H9193 Unspecified hearing loss, bilateral: Secondary | ICD-10-CM | POA: Diagnosis not present

## 2021-02-27 DIAGNOSIS — G5602 Carpal tunnel syndrome, left upper limb: Secondary | ICD-10-CM | POA: Diagnosis not present

## 2021-02-27 DIAGNOSIS — F329 Major depressive disorder, single episode, unspecified: Secondary | ICD-10-CM | POA: Diagnosis not present

## 2021-02-28 ENCOUNTER — Telehealth: Payer: Self-pay | Admitting: *Deleted

## 2021-02-28 ENCOUNTER — Telehealth: Payer: Medicare HMO | Admitting: *Deleted

## 2021-02-28 ENCOUNTER — Ambulatory Visit: Payer: Medicare HMO | Admitting: Neurology

## 2021-02-28 ENCOUNTER — Telehealth: Payer: Medicare HMO

## 2021-02-28 NOTE — Telephone Encounter (Signed)
   Telephone encounter was:  Unsuccessful.  02/28/2021 Name: [name] MRN: [id number] DOB: [birth year]  Unsuccessful outbound call made today to assist with:  To assist patient with transportation services/resources, information on Mobile Meals/Meals-on-Wheels, provide list of home health agencies/in-home care agencies/private agency sitters, and provide list of handyman services (on-line search).  Outreach Attempt:  3rd Attempt.  Referral closed unable to contact patient.  A HIPAA compliant voice message was left requesting a return call.  Instructed patient to call back at   Instructed patient to call back at [phone]  at their earliest convenience. .  [place], Care Management  [phone] [address] , [place] [address] Email : [name]

## 2021-02-28 NOTE — Telephone Encounter (Signed)
  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   An unsuccessful telephone outreach was attempted today. The patient was referred to the case management team for assistance with care management and care coordination. A HIPAA compliant message was left on voicemail, including contact information, encouraging patient to return LCSW's call at her earliest convenience.  LCSW will make a second telephone follow-up outreach call attempt within the next 5-7 business days, if a return call is not received from patient in the meantime.  Follow-Up Plan:  Referral to Care Guides to reschedule second follow-up telephone outreach call attempt.  [place] LCSW Licensed Clinical Social Worker [facility] [place] [phone]

## 2021-03-03 ENCOUNTER — Telehealth: Payer: Medicare HMO

## 2021-03-04 ENCOUNTER — Ambulatory Visit (INDEPENDENT_AMBULATORY_CARE_PROVIDER_SITE_OTHER): Payer: Medicare HMO

## 2021-03-04 DIAGNOSIS — E1140 Type 2 diabetes mellitus with diabetic neuropathy, unspecified: Secondary | ICD-10-CM

## 2021-03-04 DIAGNOSIS — I5032 Chronic diastolic (congestive) heart failure: Secondary | ICD-10-CM

## 2021-03-04 DIAGNOSIS — Z794 Long term (current) use of insulin: Secondary | ICD-10-CM

## 2021-03-04 NOTE — Chronic Care Management (AMB) (Signed)
Chronic Care Management   CCM RN Visit Note  03/04/2021 Name: [name] MRN: [id number] DOB: December 04, 1940  Subjective: [name] is a 80 y.o. year old female who is a primary care patient of Allwardt, Alyssa M, PA-C. The care management team was consulted for assistance with disease management and care coordination needs.    Engaged with patient by telephone for follow up visit in response to provider referral for case management and/or care coordination services.   Consent to Services:  The patient was given information about Chronic Care Management services, agreed to services, and gave verbal consent prior to initiation of services.  Please see initial visit note for detailed documentation.   Patient agreed to services and verbal consent obtained.   Assessment: Review of patient past medical history, allergies, medications, health status, including review of consultants reports, laboratory and other test data, was performed as part of comprehensive evaluation and provision of chronic care management services.   SDOH (Social Determinants of Health) assessments and interventions performed:    CCM Care Plan  Allergies  Allergen Reactions   Sulfa Antibiotics Swelling     Facial Swelling "swelling lips"    Sulfonamide Derivatives Swelling    Lips Swelling   Other     BLOOD PRODUCT REFUSAL    Outpatient Encounter Medications as of 03/04/2021  Medication Sig Note   Ascorbic Acid (VITAMIN C PO) Take 2 tablets by mouth daily.    Calcium Citrate-Vitamin D (CALCIUM + D PO) Take 2 tablets by mouth daily.    carvedilol (COREG) 3.125 MG tablet Take 1 tablet (3.125 mg total) by mouth 2 (two) times daily with a meal.    HYDROcodone-acetaminophen (NORCO/VICODIN) 5-325 MG tablet Take 1 tablet by mouth 2 (two) times daily as needed for moderate pain.    insulin glargine (LANTUS SOLOSTAR) 100 UNIT/ML Solostar Pen Inject 25 Units into the skin at bedtime.    lisinopril (ZESTRIL) 20 MG  tablet Take 1 tablet (20 mg total) by mouth daily.    metFORMIN (GLUCOPHAGE) 1000 MG tablet Take 1 tablet (1,000 mg total) by mouth in the morning and at bedtime.    potassium chloride (KLOR-CON) 10 MEQ tablet TAKE 2 TABLETS THREE TIMES DAILY. TAKE WITH LASIX. 03/04/2021: Reports changed to twice a day   pramipexole (MIRAPEX) 1 MG tablet Take 1 tablet (1 mg total) by mouth 2 (two) times daily. 03/04/2021: Reports takes one tablet once a day   triamcinolone cream (KENALOG) 0.1 % Apply 1 application topically 2 (two) times daily as needed (redness). Apply to red areas on legs    aspirin EC 81 MG tablet Take 1 tablet (81 mg total) by mouth daily. Swallow whole. (Patient not taking: Reported on 03/04/2021)    blood glucose meter kit and supplies Dispense based on patient and insurance preference. Use up to two  times daily as directed. (FOR ICD-10 E10.9, E11.9).    Cholecalciferol (VITAMIN D3) 50 MCG (2000 UT) CHEW Chew 4,000 Units by mouth daily. (Patient not taking: Reported on 03/04/2021)    Cyanocobalamin (B-12 PO) Take 2 tablets by mouth daily.    furosemide (LASIX) 80 MG tablet Take 1 tablet (80 mg total) by mouth 3 (three) times daily. 03/04/2021: Reports has been changed to twice a day.   Insulin Pen Needle (DROPLET PEN NEEDLES) 32G X 4 MM MISC USE AS DIRECTED ONE TIME DAILY    potassium chloride SA (KLOR-CON) 20 MEQ tablet Take 1 tablet (20 mEq total) by mouth 3 (three)  times daily. Take with Lasix. (Patient not taking: Reported on 03/04/2021)    No facility-administered encounter medications on file as of 03/04/2021.    Patient Active Problem List   Diagnosis Date Noted   Persistent atrial fibrillation ([place]) 09/26/2020   Educated about COVID-19 virus infection 11/30/2019   Chronic respiratory failure with hypoxia and hypercapnia ([place]) 07/11/2019   Acute on chronic diastolic CHF (congestive heart failure) ([place]) 07/08/2019   Hypoxia 07/07/2019   Hypokalemia 05/03/2019   Facial asymmetry,  acquired 08/20/2016   Fracture of knee prosthesis ([place]) 08/01/2016   Obesity 02/20/2016   Chronic pain syndrome 02/19/2016   Mild episode of recurrent major depressive disorder ([place]) 08/02/2015   Chronic diastolic congestive heart failure ([place]) 07/16/2015   Type 2 diabetes mellitus with diabetic neuropathy ([place]) 09/27/2014   Hearing loss - has hearing aides, follow by audiologist 10/11/2013   No blood products ([place])  09/11/2013   Disorder of bone and cartilage 06/10/2010   Breast cancer of upper-outer quadrant of right female breast ([place]) 04/04/2010   RESTLESS LEGS SYNDROME 05/05/2007   Obstructive sleep apnea 03/01/2007   Essential hypertension 01/27/2007   Osteoarthritis 01/27/2007    Conditions to be addressed/monitored:CHF and DMII  Care Plan : Heart Failure (Adult)  Updates made by Luretha Rued, RN since 03/04/2021 12:00 AM     Problem: Symptom Exacerbation (Heart Failure)   Priority: Medium     Long-Range Goal: Symptom Exacerbation Prevented or Minimized   Start Date: 10/10/2020  Expected End Date: 05/16/2021  This Visit's Progress: On track  Recent Progress: On track  Priority: Medium  Note:   Current Barriers:  Knowledge deficit related to basic heart failure pathophysiology and self care management as evidenced by patient not able to verbalize when she needs to contact provider.  Does report weighing daily 191 with ranges of 189-193 pounds.  Reports weight today 190 pounds. Denies any lower extremity swelling or increase in shortness of breath.  Patient states he has not checked blood sugar in the past week. She reports that she is out of strips and is not able to go to [facility] get strips. She states she is able to tell if your blood sugars are low. She reports a fall "either Friday or Saturday night" that occurred while she was outside. She reports she was looking up at the moon and lost her balance, adding, "my balance is not good". States she her foot is  sore, but states, "it is a lot better".  She reports she is able to walk on it and denies any swelling. She did not contact PCP. She states home health is no longer making home visits.  Case Manager Clinical Goal(s): denies sob, edema. Fallx either Friday or Saturday- patient will weigh self daily and record patient will verbalize understanding of Heart Failure Action Plan and when to call doctor patient will take all Heart Failure mediations as prescribed patient will weigh daily and record (notifying MD of 3 lb weight gain over night or 5 lb in a week) Interventions:  Collaboration with Allwardt, Randa Evens, PA-C regarding development and update of comprehensive plan of care as evidenced by provider attestation and co-signature Inter-disciplinary care team collaboration (see longitudinal plan of care) Basic overview and discussion of pathophysiology of Heart Failure reviewed and encouraged to follow rescue plan if symptoms flare-up Provided verbal education on low sodium diet Reviewed Heart Failure Action Plan in depth  Discussed importance of daily weight and advised patient to weigh  and record daily Reviewed role of diuretics in prevention of fluid overload and management of heart failure Barriers to lifestyle changes reviewed and addressed Barriers to treatment reviewed and addressed and healthy lifestyle promoted Self-awareness of signs/symptoms of worsening disease encouraged Attends all scheduled provider appointments Encouraged to eat more whole grains, fruits and vegetables, lean meats and healthy fats Discussed know when to call the doctor Assessed understanding of adherence and barriers to treatment plan, as well as lifestyle changes; develop strategies to address barriers Encouraged patient to review home care personal services list and to consider private paying for assistance at home Discussed with patient home health versus personal care services and home maid assistance;  encouraged patient/daughter to consider private pay personal aide Encouraged use of walker with all ambulation; discussed standing in front of recliner, if needing to stand for periods of time due to back pain RNCM verified with [facility] he is ob the list to start prepackaged meds; offered home health nurse extra pill boxes (decines) Discussed weights and reinforced signs/symptoms of exacerbation Reiterated with patient the importance of fall prevention and importance of notifying provider re: falls Patient Goals/Self-Care Activities: Call office if I gain more than 2 pounds in one day or 5 pounds in one week Keep legs up while sitting Continue to Weigh myself daily and track weight in diary Continue to Use salt in moderation Continue to Watch for swelling in feet, ankles and legs every day Fall precautions and prevention; use walker with all ambulation Check blood pressure and blood sugar during fall Follow Up Plan: The care management team will reach out to the patient again over the next 30 business days.       Care Plan : Diabetes Type 2 (Adult)  Updates made by Luretha Rued, RN since 03/04/2021 12:00 AM     Problem: Glycemic Management (Diabetes, Type 2)   Priority: Medium     Long-Range Goal: Patient will decrease Hgb A1c of below 7 within the next 90 days   Start Date: 10/10/2020  Expected End Date: 06/17/2021  This Visit's Progress: Not on track  Recent Progress: Not on track  Priority: Medium  Note:   Objective:  Lab Results  Component Value Date   HGBA1C 7.4 (H) 02/10/2021  Current Barriers:  Knowledge Deficits related to basic Diabetes pathophysiology and self care/management  Patient states he has not checked blood sugar in the past week. She reports that she is out of strips and is not able [facility]. She states she is able to tell if your blood sugars are low. She reports a fall "either Friday or Saturday night" that occurred while she  was outside. She reports she was looking up at the moon and lost her balance, adding, "my balance is not good". States she her foot is sore, but states, "it is a lot better".  She reports she is able to walk on it and denies any swelling. She did not contact PCP. She states home health is no longer making home visits. She reports daughter lives next door and last saw her this past Friday.  Case Manager Clinical Goal(s):  patient will demonstrate improved adherence to prescribed treatment plan for diabetes self care/management as evidenced by: adherence to ADA/ carb modified diet adherence to prescribed medication regimen Interventions:  Collaboration with Allwardt, Randa Evens, PA-C regarding development and update of comprehensive plan of care as evidenced by provider attestation and co-signature Inter-disciplinary care team collaboration (see longitudinal  plan of care) Provided education to patient about basic DM disease process Reviewed medications with patient and discussed importance of medication adherence Discussed plans with patient for ongoing care management follow up and provided patient with direct contact information for care management team Provided patient with educational materials related to hypo and hyperglycemia and importance of correct treatment Barriers to adherence to treatment plan identified Blood glucose monitoring encouraged Self-awareness of signs/symptoms of hypo or hyperglycemia encouraged Encouraged to attend all scheduled provider appointments Reviewed mutually-set A1C goal [facility] Living Well with Diabetes Educational Packet and encouraged patient to review Confirmed patient still has [place] number [phone]) Emotional support and empathy provided to patient Encouraged and instructed patient to check blood sugars at least twice a day while on insulin injections Encouraged to continue to work with CCM Pharmacist to help with possible pill packs for  patient and possible medication compliance Discussed signs/symptoms of hypoglycemia. Discussed fall prevention strategies. Discussed the importance of contacting provider when she has a fall. Collaboration with clinical pharmacist regarding possible delivery or mail order for diabetic supplies. Patient Goals/Self-Care Activities: Set target A1C 7 (current 6.9) Work with pharmacist to obtain diabetic supplies. Check blood sugars 2 times a day and document in log Practice Fall prevention strategies discussed. Notify your provider regarding any further falls. Follow Up Plan: The care management team will reach out to the patient again over the next 30 business days.      Plan:Telephone follow up appointment with care management team member scheduled for:  next month. Update assigned RNCM, Hubert Azure. The patient has been provided with contact information for the care management team and has been advised to call with any health related questions or concerns.    Covering [place], RN, MSN, BSN, CCM Care Management Coordinator [phone]

## 2021-03-04 NOTE — Patient Instructions (Signed)
Visit Information  PATIENT GOALS:  Goals Addressed             This Visit's Progress    (RNCM) Set My Target A1C-Diabetes Type 2   Not on track    Timeframe:  Long-Range Goal Priority:  Medium Start Date:   10/10/20                          Expected End Date:  05/16/21                     Follow Up Date 03/27/21    Patient Goals: Set target A1C 7 (current 6.9) Work with pharmacist to obtain diabetic supplies. Check blood sugars 2 times a day and document in log Practice Fall prevention strategies discussed. Notify your provider regarding any further falls.   Why is this important?   Your target A1C is decided together by you and your doctor.  It is based on several things like your age and other health issues.    Notes:      (RNCM) Track and Manage Fluids and Swelling-Heart Failure   On track    Timeframe:  Long-Range Goal Priority:  Medium Start Date:   10/10/20                          Expected End Date: 05/16/21                    Follow Up Date  03/27/21   Patient Goals: Call office if I gain more than 2 pounds in one day or 5 pounds in one week Keep legs up while sitting Continue to Weigh myself daily and track weight in diary Continue to Use salt in moderation Continue to Watch for swelling in feet, ankles and legs every day Fall precautions and prevention; use walker with all ambulation Check blood pressure and blood sugar during fall   Why is this important?   It is important to check your weight daily and watch how much salt and liquids you have.  It will help you to manage your heart failure.    Notes:         Patient verbalizes understanding of instructions provided today and agrees to view in [place].   Telephone follow up appointment with care management team member scheduled for: 04/05/2021 The patient has been provided with contact information for the care management team and has been advised to call with any health related questions or  concerns.   Covering RNCM: Thea Silversmith, RN, MSN, BSN, CCM Care Management Coordinator [phone]

## 2021-03-05 ENCOUNTER — Telehealth: Payer: Self-pay | Admitting: *Deleted

## 2021-03-05 ENCOUNTER — Telehealth: Payer: Medicare HMO

## 2021-03-05 ENCOUNTER — Telehealth: Payer: Medicare HMO | Admitting: *Deleted

## 2021-03-05 DIAGNOSIS — G5602 Carpal tunnel syndrome, left upper limb: Secondary | ICD-10-CM | POA: Diagnosis not present

## 2021-03-05 DIAGNOSIS — I110 Hypertensive heart disease with heart failure: Secondary | ICD-10-CM | POA: Diagnosis not present

## 2021-03-05 DIAGNOSIS — I5030 Unspecified diastolic (congestive) heart failure: Secondary | ICD-10-CM | POA: Diagnosis not present

## 2021-03-05 DIAGNOSIS — F329 Major depressive disorder, single episode, unspecified: Secondary | ICD-10-CM | POA: Diagnosis not present

## 2021-03-05 DIAGNOSIS — M159 Polyosteoarthritis, unspecified: Secondary | ICD-10-CM | POA: Diagnosis not present

## 2021-03-05 DIAGNOSIS — G4730 Sleep apnea, unspecified: Secondary | ICD-10-CM | POA: Diagnosis not present

## 2021-03-05 DIAGNOSIS — H9193 Unspecified hearing loss, bilateral: Secondary | ICD-10-CM | POA: Diagnosis not present

## 2021-03-05 DIAGNOSIS — E119 Type 2 diabetes mellitus without complications: Secondary | ICD-10-CM | POA: Diagnosis not present

## 2021-03-05 DIAGNOSIS — F419 Anxiety disorder, unspecified: Secondary | ICD-10-CM | POA: Diagnosis not present

## 2021-03-05 NOTE — Telephone Encounter (Signed)
  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   An unsuccessful telephone outreach was attempted today. The patient was referred to the case management team for assistance with care management and care coordination. A HIPAA compliant message was left on voicemail, including contact information, encouraging patient to return LCSW's call at her earliest convenience.  LCSW will make another follow-up outreach call attempt within the next 5-7 business days, if a return call is not received from patient in the meantime.  Follow-Up Plan:  Request placed to Care Guides to schedule follow-up outreach call with LCSW.  [place] LCSW Licensed Clinical Social Worker [place] [facility] [place] [phone]

## 2021-03-07 ENCOUNTER — Other Ambulatory Visit: Payer: Self-pay

## 2021-03-07 ENCOUNTER — Ambulatory Visit: Payer: Medicare HMO | Admitting: Neurology

## 2021-03-07 ENCOUNTER — Encounter: Payer: Self-pay | Admitting: Neurology

## 2021-03-07 VITALS — Ht 60.0 in | Wt 192.6 lb

## 2021-03-07 DIAGNOSIS — I951 Orthostatic hypotension: Secondary | ICD-10-CM

## 2021-03-07 DIAGNOSIS — G4733 Obstructive sleep apnea (adult) (pediatric): Secondary | ICD-10-CM | POA: Diagnosis not present

## 2021-03-07 DIAGNOSIS — E0842 Diabetes mellitus due to underlying condition with diabetic polyneuropathy: Secondary | ICD-10-CM

## 2021-03-07 NOTE — Patient Instructions (Addendum)
Please contact your sleep specialist Dr. Elsworth Soho (telephone : [phone]) for follow-up  2. Please contact your heart doctor Dr. Percival Spanish (telephone  [phone]) for follow-up  3. Try using an abdominal binder and slowly change positions to reduce blood pressure dropping too fast  4. Use walker at all times, do physical therapy exercises regularly  5. Follow-up as needed, call for any changes

## 2021-03-07 NOTE — Progress Notes (Signed)
NEUROLOGY CONSULTATION NOTE  [name] MRN: [id number] DOB: 01-04-1941  Referring provider: Theresa Duty, PA-C Primary care provider: Theresa Duty, PA-C  Reason for consult:  syncope   Thank you for your kind referral of [name] for consultation of the above symptoms. Although her history is well known to you, please allow me to reiterate it for the purpose of our medical record. The patient was accompanied to the clinic by her husband. Records and images were personally reviewed where available.  HISTORY OF PRESENT ILLNESS: This is an 80 year old right-handed woman with multiple medical issues including hypertension, CHF, OSA, chronic respiratory failure with hypoxia and hypercapnia, diabetes, atrial fibrillation, presenting for evaluation of syncope. When she saw her PCP last month, she reported a fall on 02/06/21 where she is not sure what happened, she only remembers "coming back to." She knows she lost consciousness and states that it is "pretty often" that she passes out, usually waking up before she hits the ground. She also reports frequent falls, one time she fell 6 times in one day. Last fall was this week, she looked up at the moon and fell backward, no loss of consciousness. She has had falls while using her walker, other times she is at the kitchen counter. She denies any prior warning symptoms, no dizziness, vision changes. She "wakes up" before she hits the ground and denies any confusion, she is able to speak. She does not want to move, "not ready" and has to sit there for a while to "get my act together," but denies any focal weakness. She denies any tongue bite, she has urinary incontinence and "always wets myself." She denies any headaches, olfactory/gustatory hallucinations, focal numbness/tingling/weakness, myoclonic jerks. She lives with her husband who has not mentioned any staring episodes or convulsive activity. She has a breathing machine (?CPAP) that she  only gets to wear 2 hours at a time ("they tell me I never get 4 hours on the machine"). She "falls asleep" all the time, if she is not engaged in conversation, she will fall asleep. When asked if she falls asleep when she is having the falls, she says "I could be falling asleep." She was last seen by Cardiology in 09/2020,at that time she was started on Eliquis for atrial fibrillation, she states "I put it on and I took it off," not taking it any longer. She is finishing up with physical therapy. She is always short of breath, no chest pain. She states "I forget a lot," she forgets her morning medications and takes them at night, sometimes does not take the night medication at all. She is being set up with pillpacks to help with compliance. She denies any family history of seizures, no history of CNS infections such as meningitis/encephalitis, significant traumatic brain injury, neurosurgical procedures.  I personally reviewed head CT without contrast done 01/2021 which did not show any acute changes. There was mild diffuse atrophy and advanced chronic microvascular disease.   PAST MEDICAL HISTORY: Past Medical History:  Diagnosis Date   AK (actinic keratosis)    Anxiety    occasional   Breast cancer ([place])    R breast, s/p radiation 34Gy/66f 04/29/10-05/05/10   CTS (carpal tunnel syndrome)    Left   Diabetes ([facility])    Diastolic [place]F [place]    Dx w/ hospitalization 06/2015 for respiratory failure   Facial asymmetry, acquired 08/20/2016   From injury   Falls frequently    "can not pick left  leg up"   Hearing loss    bilateral hearing aides   History of environmental allergies    History of kidney stones 20 years ago   Hypertension    MDD (major depressive disorder)    No blood products ([employer])  09/11/2013   Osteoarthritis    hx shoulder, knee, back, wrist pain; saw Dr. Wynelle Link    Personal history of radiation therapy    RESTLESS LEGS SYNDROME 05/05/2007   Qualifier: Diagnosis of   By: Gwenette Greet MD, Armando Reichert    RLS (restless legs syndrome)    Sleep apnea     PAST SURGICAL HISTORY: Past Surgical History:  Procedure Laterality Date   APPENDECTOMY  age 80   BREAST LUMPECTOMY Right 2012   BREAST SURGERY  2012   rt breast lumpectomy   CATARACT EXTRACTION W/PHACO Left 08/12/2020   Procedure: CATARACT EXTRACTION PHACO AND INTRAOCULAR LENS PLACEMENT LEFT EYE;  Surgeon: Baruch Goldmann, MD;  Location: [facility];  Service: Ophthalmology;  Laterality: Left;  left CDE:13.68   CATARACT EXTRACTION W/PHACO Right 08/26/2020   Procedure: CATARACT EXTRACTION PHACO AND INTRAOCULAR LENS PLACEMENT RIGHT EYE;  Surgeon: Baruch Goldmann, MD;  Location: [facility];  Service: Ophthalmology;  Laterality: Right;  right CDE=12.48   flex signoidoscopy     INCISION AND DRAINAGE PERIRECTAL ABSCESS     JOINT REPLACEMENT Right 2006   knee   REPLACEMENT TOTAL KNEE Right    TOTAL KNEE ARTHROPLASTY Left 08/06/2014   Procedure: LEFT TOTAL KNEE ARTHROPLASTY;  Surgeon: Gaynelle Arabian, MD;  Location: [facility];  Service: Orthopedics;  Laterality: Left;   TUBAL LIGATION  1979    MEDICATIONS: Current Outpatient Medications on File Prior to Visit  Medication Sig Dispense Refill   Ascorbic Acid (VITAMIN C PO) Take 2 tablets by mouth daily.     blood glucose meter kit and supplies Dispense based on patient and insurance preference. Use up to two  times daily as directed. (FOR ICD-10 E10.9, E11.9). 1 each 0   carvedilol (COREG) 3.125 MG tablet Take 1 tablet (3.125 mg total) by mouth 2 (two) times daily with a meal. 180 tablet 1   Cyanocobalamin (B-12 PO) Take 2 tablets by mouth daily.     furosemide (LASIX) 80 MG tablet Take 1 tablet (80 mg total) by mouth 3 (three) times daily. 270 tablet 0   HYDROcodone-acetaminophen (NORCO/VICODIN) 5-325 MG tablet Take 1 tablet by mouth 2 (two) times daily as needed for moderate pain.     insulin glargine (LANTUS SOLOSTAR) 100 UNIT/ML Solostar Pen Inject 25 Units into the skin at  bedtime. 30 mL 2   Insulin Pen Needle (DROPLET PEN NEEDLES) 32G X 4 MM MISC USE AS DIRECTED ONE TIME DAILY 100 each 6   lisinopril (ZESTRIL) 20 MG tablet Take 1 tablet (20 mg total) by mouth daily. 90 tablet 2   metFORMIN (GLUCOPHAGE) 1000 MG tablet Take 1 tablet (1,000 mg total) by mouth in the morning and at bedtime. 180 tablet 0   potassium chloride (KLOR-CON) 10 MEQ tablet TAKE 2 TABLETS THREE TIMES DAILY. TAKE WITH LASIX. 540 tablet 0   pramipexole (MIRAPEX) 1 MG tablet Take 1 tablet (1 mg total) by mouth 2 (two) times daily. 180 tablet 0   triamcinolone cream (KENALOG) 0.1 % Apply 1 application topically 2 (two) times daily as needed (redness). Apply to red areas on legs 45 g 1   Calcium Citrate-Vitamin D (CALCIUM + D PO) Take 2 tablets by mouth daily. (Patient not taking: Reported  on 03/07/2021)     Cholecalciferol (VITAMIN D3) 50 MCG (2000 UT) CHEW Chew 4,000 Units by mouth daily. (Patient not taking: No sig reported)     No current facility-administered medications on file prior to visit.    ALLERGIES: Allergies  Allergen Reactions   Sulfa Antibiotics Swelling     Facial Swelling "swelling lips"    Sulfonamide Derivatives Swelling    Lips Swelling   Other     BLOOD PRODUCT REFUSAL    FAMILY HISTORY: Family History  Problem Relation Age of Onset   Alcohol abuse Father    Cancer Sister        breast   Heart disease Paternal Grandfather    Diabetes Other    Obesity Other    Heart disease Brother        Valve    SOCIAL HISTORY: Social History   Socioeconomic History   Marital status: Married    Spouse name: Vyla Pint   Number of children: 4   Years of education: 12   Highest education level: 12th grade  Occupational History   Not on file  Tobacco Use   Smoking status: Never    Passive exposure: Never   Smokeless tobacco: Never  Vaping Use   Vaping Use: Never used  Substance and Sexual Activity   Alcohol use: No   Drug use: No   Sexual activity: Yes   Other Topics Concern   Not on file  Social History Narrative   Right handed       Home Situation: lives with husband and granddaughter      Spiritual Beliefs: [employer] Witness - no blood products               Social Determinants of Health   Financial Resource Strain: Low Risk    Difficulty of Paying Living Expenses: Not hard at all  Food Insecurity: No Food Insecurity   Worried About Charity fundraiser in the Last Year: Never true   Ran Out of Food in the Last Year: Never true  Transportation Needs: No Transportation Needs   Lack of Transportation (Medical): No   Lack of Transportation (Non-Medical): No  Physical Activity: Inactive   Days of Exercise per Week: 0 days   Minutes of Exercise per Session: 0 min  Stress: No Stress Concern Present   Feeling of Stress : Only a little  Social Connections: Moderately Integrated   Frequency of Communication with Friends and Family: More than three times a week   Frequency of Social Gatherings with Friends and Family: More than three times a week   Attends Religious Services: More than 4 times per year   Active Member of [employer] or Organizations: No   Attends Archivist Meetings: Never   Marital Status: Married  Human resources officer Violence: Not At Risk   Fear of Current or Ex-Partner: No   Emotionally Abused: No   Physically Abused: No   Sexually Abused: No     PHYSICAL EXAM: Supine BP 168/80, HR 78 Sitting BP 158/80, HR 91 Standing BP 140/88, HR 90 Today's Vitals   03/07/21 1251 03/07/21 1307 03/07/21 1308 03/07/21 1309  SpO2: 93% (!) 88% 97% 97%  Weight: 192 lb 9.6 oz (87.4 kg)     Height: 5' (1.524 m)      Body mass index is 37.61 kg/m.  General: No acute distress Head:  Normocephalic/atraumatic Skin/Extremities: No rash, no edema Neurological Exam: Mental status: alert and oriented to person, place, and  time, no dysarthria or aphasia, Fund of knowledge is appropriate. Attention and concentration are  normal.    Cranial nerves: CN I: not tested CN II: pupils equal, round and reactive to light, visual fields intact CN III, IV, VI:  full range of motion, no nystagmus, no ptosis CN V: facial sensation intact CN VII: upper and lower face symmetric CN VIII: hard of hearing Bulk & Tone: normal, no fasciculations. Motor: 5/5 throughout with no pronator drift. Sensation: intact to light touch, cold, on both UE, decreased cold and vibration sense to knees bilaterally, decreased pin on right UE and LE. Romberg test positive Deep Tendon Reflexes: unable to elicit reflexes Cerebellar: no incoordination on finger to nose testing Gait: narrow-based with good strides with walker, no ataxia Tremor: none    IMPRESSION: This is an 80 year old right-handed woman with multiple medical issues including hypertension, CHF, OSA, chronic respiratory failure with hypoxia and hypercapnia, diabetes, atrial fibrillation, presenting for evaluation of syncope. She has had frequent falls, some with loss of consciousness. Her neurological exam is largely non-focal, with note of length-dependent neuropathy which we discussed is the likely cause of her balance issues and may lead to falls. Continue with home PT balance exercises and using walker at all times. The episodes of loss of consciousness are not suggestive of seizure, she states she "wakes up" as she is hitting the ground. She is noted to have orthostasis today, with >20-pt drop in SBP supine to standing (168 to 140), she was asymptomatic today. Etiology of episodes of loss of consciousness unclear, but possibly due to orthostasis versus poorly controlled sleep apnea where she feels she may be falling asleep. Discussed follow-up with Cardiology and Sleep Medicine. She has not seen her sleep specialist in over a year and reports poor utilization of her machine. She does not drive. Follow-up as needed, call for any changes.     Thank you for allowing me to participate  in the care of this patient. Please do not hesitate to call for any questions or concerns.   Ellouise Newer, M.D.  CC: Wayne Sever, Dr. Percival Spanish, Dr. Elsworth Soho

## 2021-03-10 DIAGNOSIS — W19XXXA Unspecified fall, initial encounter: Secondary | ICD-10-CM | POA: Diagnosis not present

## 2021-03-10 DIAGNOSIS — R5381 Other malaise: Secondary | ICD-10-CM | POA: Diagnosis not present

## 2021-03-10 DIAGNOSIS — R531 Weakness: Secondary | ICD-10-CM | POA: Diagnosis not present

## 2021-03-11 ENCOUNTER — Other Ambulatory Visit: Payer: Self-pay | Admitting: Physician Assistant

## 2021-03-11 ENCOUNTER — Telehealth: Payer: Self-pay | Admitting: Pharmacist

## 2021-03-11 MED ORDER — POTASSIUM CHLORIDE CRYS ER 20 MEQ PO TBCR: 20.0000 meq | EXTENDED_RELEASE_TABLET | Freq: Three times a day (TID) | ORAL | 2 refills | Status: DC

## 2021-03-11 NOTE — Chronic Care Management (AMB) (Signed)
Reviewed meds, requested updated Rx for potassium 76meq one tablet tid.  Patient due to receive delivery on 11/1.  Beverly Milch, PharmD Clinical Pharmacist [phone]

## 2021-03-11 NOTE — Progress Notes (Addendum)
  Chronic Care Management Pharmacy Assistant   Name: [name]  MRN: [id number] DOB: 07/29/1940   Reason for Encounter: Monthly Medication Coordination Call    Medications: Outpatient Encounter Medications as of 03/11/2021  Medication Sig Note   Ascorbic Acid (VITAMIN C PO) Take 2 tablets by mouth daily.    blood glucose meter kit and supplies Dispense based on patient and insurance preference. Use up to two  times daily as directed. (FOR ICD-10 E10.9, E11.9).    Calcium Citrate-Vitamin D (CALCIUM + D PO) Take 2 tablets by mouth daily. (Patient not taking: Reported on 03/07/2021)    carvedilol (COREG) 3.125 MG tablet Take 1 tablet (3.125 mg total) by mouth 2 (two) times daily with a meal.    Cholecalciferol (VITAMIN D3) 50 MCG (2000 UT) CHEW Chew 4,000 Units by mouth daily. (Patient not taking: No sig reported)    Cyanocobalamin (B-12 PO) Take 2 tablets by mouth daily.    furosemide (LASIX) 80 MG tablet Take 1 tablet (80 mg total) by mouth 3 (three) times daily. 03/04/2021: Reports has been changed to twice a day.   HYDROcodone-acetaminophen (NORCO/VICODIN) 5-325 MG tablet Take 1 tablet by mouth 2 (two) times daily as needed for moderate pain.    insulin glargine (LANTUS SOLOSTAR) 100 UNIT/ML Solostar Pen Inject 25 Units into the skin at bedtime.    Insulin Pen Needle (DROPLET PEN NEEDLES) 32G X 4 MM MISC USE AS DIRECTED ONE TIME DAILY    lisinopril (ZESTRIL) 20 MG tablet Take 1 tablet (20 mg total) by mouth daily.    metFORMIN (GLUCOPHAGE) 1000 MG tablet Take 1 tablet (1,000 mg total) by mouth in the morning and at bedtime.    potassium chloride (KLOR-CON) 10 MEQ tablet TAKE 2 TABLETS THREE TIMES DAILY. TAKE WITH LASIX. 03/04/2021: Reports changed to twice a day   pramipexole (MIRAPEX) 1 MG tablet Take 1 tablet (1 mg total) by mouth 2 (two) times daily. 03/04/2021: Reports takes one tablet once a day   triamcinolone cream (KENALOG) 0.1 % Apply 1 application topically 2 (two) times  daily as needed (redness). Apply to red areas on legs    No facility-administered encounter medications on file as of 03/11/2021.    Reviewed chart for medication changes ahead of medication coordination call.  No OVs, Consults, or hospital visits since last care coordination call/Pharmacist visit. (If appropriate, list visit date, provider name)  No medication changes indicated OR if recent visit, treatment plan here.  BP Readings from Last 3 Encounters:  02/10/21 131/85  11/20/20 (!) 158/92  10/16/20 (!) 144/80    Lab Results  Component Value Date   HGBA1C 7.4 (H) 02/10/2021     Patient obtains medications through Adherence Packaging  30 Days   Last adherence delivery included: (medication name and frequency) This is patient's first delivery.   Patient is due for next adherence delivery on: 03/18/21. Called patient and reviewed medications and coordinated delivery.  This delivery to include: Carvedilol 3.125 mg 1 tablet twice daily Furosemide 80 mg 1 tablet three times daily Lisinopril 20 mg 1 tablet daily Metformin 1000 mg 1 tablet twice daily Potassium Chloride ER 10 meq 1 tablet three times daily (daughter reports patient takes 2 three times daily. Would like to be changed to 20 meq 1 three times a day to reduce amount of pills patient takes) Pramipexole 1 mg 1 tablet twice daily   Patient declined the following medications (meds) due to (reason)  Patient needs refills for None. (  See above note on Potasium dosing request change)  Confirmed delivery date of 03/18/21, advised patient that pharmacy will contact them the morning of delivery. Daughter requests for this to be patient's sync date to begin her pill packs. Patent unable due to memory concerns to aid in sync date.    Care Gaps  AWV: overdue  Colonoscopy: unknown  DM Eye Exam: overdue 12/20/19 DM Foot Exam: overdue 01/24/20 Microalbumin: overdue  HbgAIC: done 02/10/21 (7.4) DEXA: done 12/07/19 Mammogram:  scheduled 03/25/21  Star Rating Drugs: metFORMIN (GLUCOPHAGE) 1000 MG tablet - last filled 12/21/20 90 days  lisinopril (ZESTRIL) 20 MG tablet - last filled 11/26/20 90 days   Future Appointments  Date Time Provider Department Center  03/18/2021  2:00 PM [facility] SOCIAL [facility]  03/25/2021  2:40 PM [facility] MM 3 [facility]MM [facility]-BRE[facility]  03/27/2021  1:00 PM [facility] CARE [facility]  05/27/2021  1:30 PM [facility] CCM PHARMACIST [facility]  10/13/2021  8:45 AM [facility] HEALTH COACH [facility]     Patient seemed very confused about her medication. Called and spoke to patient's daughter about patient's medications. Provided list. [place]aughter [place] but has access to patients medical records and medicaiton list. She was able to confirm medications and did note a change in Potassium dosing which is mentioned above. Daughter unable to provide pill count but would like sync date to be on scheduled delivery date of 03/18/21.   Liza Showfety, CCMA Clinical Pharmacist Assistant  [phone]  12 minutes spent in review, coordination, and documentation.  Reviewed by: Christian Davis, PharmD Clinical Pharmacist [phone]   

## 2021-03-17 ENCOUNTER — Ambulatory Visit (INDEPENDENT_AMBULATORY_CARE_PROVIDER_SITE_OTHER): Payer: Medicare HMO

## 2021-03-17 ENCOUNTER — Other Ambulatory Visit: Payer: Self-pay

## 2021-03-17 ENCOUNTER — Ambulatory Visit: Payer: Medicare HMO | Admitting: Adult Health

## 2021-03-17 ENCOUNTER — Encounter: Payer: Self-pay | Admitting: Adult Health

## 2021-03-17 VITALS — BP 142/80 | HR 94 | Temp 97.6°F | Ht 60.0 in | Wt 193.2 lb

## 2021-03-17 DIAGNOSIS — J9611 Chronic respiratory failure with hypoxia: Secondary | ICD-10-CM

## 2021-03-17 DIAGNOSIS — E1140 Type 2 diabetes mellitus with diabetic neuropathy, unspecified: Secondary | ICD-10-CM

## 2021-03-17 DIAGNOSIS — Z23 Encounter for immunization: Secondary | ICD-10-CM

## 2021-03-17 DIAGNOSIS — R0602 Shortness of breath: Secondary | ICD-10-CM | POA: Diagnosis not present

## 2021-03-17 DIAGNOSIS — I5032 Chronic diastolic (congestive) heart failure: Secondary | ICD-10-CM | POA: Diagnosis not present

## 2021-03-17 DIAGNOSIS — Z6841 Body Mass Index (BMI) 40.0 and over, adult: Secondary | ICD-10-CM

## 2021-03-17 DIAGNOSIS — G4733 Obstructive sleep apnea (adult) (pediatric): Secondary | ICD-10-CM | POA: Diagnosis not present

## 2021-03-17 DIAGNOSIS — Z794 Long term (current) use of insulin: Secondary | ICD-10-CM

## 2021-03-17 DIAGNOSIS — J9612 Chronic respiratory failure with hypercapnia: Secondary | ICD-10-CM | POA: Diagnosis not present

## 2021-03-17 NOTE — Progress Notes (Signed)
'@Patient'  ID: [name], female    DOB: 02-06-1941, 80 y.o.   MRN: [id number]  Chief Complaint  Patient presents with   Follow-up    Referring provider: Allwardt, Randa Evens, PA-C  HPI: 80 year old female never smoker followed for severe obstructive sleep apnea and OHS.  TEST/EVENTS :  Echo 06/2019 >> normal LV function, RV was not dilated, IVC dilated   03/13/2016 NPSG >>severe sleep apnea with AHI of 61/Hr . TST 34 minutes only     03/17/2021 Follow up : OSA  Patient returns for a follow-up visit.  She was last seen August 25, 2019.  Patient has underlying severe obstructive sleep apnea.  She was started on a trilogy device at bedtime with oxygen She had previously been on CPAP but was unable to tolerate.  She had chronic hypercarbia in the setting of obesity hypoventilation and chronic diastolic heart failure. Patient says she wears her trilogy device most each night.  It does help with her breathing shortness of breath and daytime sleepiness.  Trilogy download over the last 30 days shows 70% usage.  With daily average usage at 2.7 hours. Says she does not sleep very much only about 2-3 hrs each night .  We discussed wearing her machine with naps and At bedtime  .  Continues to be fatigued and gets winded with activities. Has low activity tolerance. Uses Rolling walker .   Allergies  Allergen Reactions   Sulfa Antibiotics Swelling     Facial Swelling "swelling lips"    Sulfonamide Derivatives Swelling    Lips Swelling   Other     BLOOD PRODUCT REFUSAL    Immunization History  Administered Date(s) Administered   Fluad Quad(high Dose 65+) 01/24/2019   Influenza Split 02/20/2011, 01/25/2012   Influenza Whole 02/16/1999, 02/25/2007, 03/07/2009, 02/14/2010   Influenza, High Dose Seasonal PF 02/17/2016, 03/23/2017, 01/20/2018   Influenza,inj,Quad PF,6+ Mos 02/01/2013, 02/19/2014, 01/30/2015   Influenza,inj,quad, With Preservative 02/15/2017   PFIZER Comirnaty(Gray  Top)Covid-19 Tri-Sucrose Vaccine 07/01/2020   PFIZER(Purple Top)SARS-COV-2 Vaccination 06/06/2019, 06/26/2019   Pneumococcal Conjugate-13 01/30/2015   Pneumococcal Polysaccharide-23 10/11/2013   Td 05/18/2005   Tdap 10/21/2015   Zoster, Live 02/20/2011    Past Medical History:  Diagnosis Date   AK (actinic keratosis)    Anxiety    occasional   Breast cancer ([place])    R breast, s/p radiation 34Gy/82f 04/29/10-05/05/10   CTS (carpal tunnel syndrome)    Left   Diabetes (HCC)    Diastolic CHF ([place]    Dx w/ hospitalization 06/2015 for respiratory failure   Facial asymmetry, acquired 08/20/2016   From injury   Falls frequently    "can not pick left leg up"   Hearing loss    bilateral hearing aides   History of environmental allergies    History of kidney stones 20 years ago   Hypertension    MDD (major depressive disorder)    No blood products ([place])  09/11/2013   Osteoarthritis    hx shoulder, knee, back, wrist pain; saw Dr. AWynelle Link   Personal history of radiation therapy    RESTLESS LEGS SYNDROME 05/05/2007   Qualifier: Diagnosis of  By: CGwenette GreetMD, KArmando Reichert   RLS (restless legs syndrome)    Sleep apnea     Tobacco History: Social History   Tobacco Use  Smoking Status Never   Passive exposure: Never  Smokeless Tobacco Never   Counseling given: Not Answered   Outpatient Medications Prior to Visit  Medication Sig Dispense Refill   blood glucose meter kit and supplies Dispense based on patient and insurance preference. Use up to two  times daily as directed. (FOR ICD-10 E10.9, E11.9). 1 each 0   carvedilol (COREG) 3.125 MG tablet Take 1 tablet (3.125 mg total) by mouth 2 (two) times daily with a meal. 180 tablet 1   Cyanocobalamin (B-12 PO) Take 2 tablets by mouth daily.     furosemide (LASIX) 80 MG tablet Take 1 tablet (80 mg total) by mouth 3 (three) times daily. 270 tablet 0   HYDROcodone-acetaminophen (NORCO/VICODIN) 5-325 MG tablet Take 1 tablet by  mouth 2 (two) times daily as needed for moderate pain.     insulin glargine (LANTUS SOLOSTAR) 100 UNIT/ML Solostar Pen Inject 25 Units into the skin at bedtime. 30 mL 2   Insulin Pen Needle (DROPLET PEN NEEDLES) 32G X 4 MM MISC USE AS DIRECTED ONE TIME DAILY 100 each 6   lisinopril (ZESTRIL) 20 MG tablet Take 1 tablet (20 mg total) by mouth daily. 90 tablet 2   metFORMIN (GLUCOPHAGE) 1000 MG tablet Take 1 tablet (1,000 mg total) by mouth in the morning and at bedtime. 180 tablet 0   potassium chloride SA (KLOR-CON) 20 MEQ tablet Take 1 tablet (20 mEq total) by mouth 3 (three) times daily. 90 tablet 2   pramipexole (MIRAPEX) 1 MG tablet Take 1 tablet (1 mg total) by mouth 2 (two) times daily. 180 tablet 0   triamcinolone cream (KENALOG) 0.1 % Apply 1 application topically 2 (two) times daily as needed (redness). Apply to red areas on legs 45 g 1   Ascorbic Acid (VITAMIN C PO) Take 2 tablets by mouth daily. (Patient not taking: Reported on 03/17/2021)     Calcium Citrate-Vitamin D (CALCIUM + D PO) Take 2 tablets by mouth daily. (Patient not taking: Reported on 03/17/2021)     Cholecalciferol (VITAMIN D3) 50 MCG (2000 UT) CHEW Chew 4,000 Units by mouth daily. (Patient not taking: No sig reported)     No facility-administered medications prior to visit.     Review of Systems:   Constitutional:   No  weight loss, night sweats,  Fevers, chills,  +fatigue, or  lassitude.  HEENT:   No headaches,  Difficulty swallowing,  Tooth/dental problems, or  Sore throat,                No sneezing, itching, ear ache, nasal congestion, post nasal drip,   CV:  No chest pain,  Orthopnea, PND, swelling in lower extremities, anasarca, dizziness, palpitations, syncope.   GI  No heartburn, indigestion, abdominal pain, nausea, vomiting, diarrhea, change in bowel habits, loss of appetite, bloody stools.   Resp:   No chest wall deformity  Skin: no rash or lesions.  GU: no dysuria, change in color of urine, no  urgency or frequency.  No flank pain, no hematuria   MS:  No joint pain or swelling.  No decreased range of motion.  No back pain.    Physical Exam  BP (!) 142/80 (BP Location: Left Arm, Patient Position: Sitting, Cuff Size: Normal)   Pulse 94   Temp 97.6 F (36.4 C) (Oral)   Ht 5' (1.524 m)   Wt 193 lb 3.2 oz (87.6 kg)   SpO2 93%   BMI 37.73 kg/m   GEN: A/Ox3; pleasant , NAD, well nourished , rolling walker    HEENT:  [place]/AT,    NOSE-clear, THROAT-clear, no lesions, no postnasal drip or exudate noted.  NECK:  Supple w/ fair ROM; no JVD; normal carotid impulses w/o bruits; no thyromegaly or nodules palpated; no lymphadenopathy.    RESP  Clear  P & A; w/o, wheezes/ rales/ or rhonchi. no accessory muscle use, no dullness to percussion  CARD:  RRR, no m/r/g, tr-1+  peripheral edema, pulses intact, no cyanosis or clubbing.  GI:   Soft & nt; nml bowel sounds; no organomegaly or masses detected.   Musco: Warm bil, no deformities or joint swelling noted.   Neuro: alert, no focal deficits noted.    Skin: Warm, no lesions or rashes    Lab Results:  CBC   BMET   BNP   ProBNP No results found for: PROBNP  Imaging: No results found.    No flowsheet data found.  No results found for: NITRICOXIDE      Assessment & Plan:   Obstructive sleep apnea OSA /OHS -encouraged on TRILOGY usage, long discussion regarding to use more at bedtime . She does not sleep for long increments.   Plan  Patient Instructions  Continue on TRILOGY device At bedtime with oxygen  Wear at bedtime and with naps  Chest xray today  Work on healthy weight  Do not drive if sleepy  Flu shot today .  Follow up with Dr. Elsworth Soho  In 4-6 months and As needed        Obesity Healthy weight loss.   Chronic respiratory failure with hypoxia and hypercapnia (HCC) Continue on O2 with Trilogy device .   Chronic diastolic congestive heart failure ([place]) Appears compensated.  Has chronic  dyspnea suspect is related to diastolic dysfunction .  Check chest xray today   Plan Patient Instructions  Continue on TRILOGY device At bedtime with oxygen  Wear at bedtime and with naps  Chest xray today  Work on healthy weight  Do not drive if sleepy  Flu shot today .  Follow up with Dr. Elsworth Soho  In 4-6 months and As needed     '     Alianys Chacko, NP 03/17/2021

## 2021-03-17 NOTE — Addendum Note (Signed)
Addended by: Vanessa Barbara on: 03/17/2021 05:31 PM   Modules accepted: Orders

## 2021-03-17 NOTE — Assessment & Plan Note (Signed)
Appears compensated.  Has chronic dyspnea suspect is related to diastolic dysfunction .  Check chest xray today   Plan Patient Instructions  Continue on TRILOGY device At bedtime with oxygen  Wear at bedtime and with naps  Chest xray today  Work on healthy weight  Do not drive if sleepy  Flu shot today .  Follow up with Dr. Elsworth Soho  In 4-6 months and As needed     '

## 2021-03-17 NOTE — Patient Instructions (Addendum)
Continue on TRILOGY device At bedtime with oxygen  Wear at bedtime and with naps  Chest xray today  Work on healthy weight  Do not drive if sleepy  Flu shot today .  Set up for Overnight oximetry test on TRILOGY only (no Oxygen)  Follow up with Dr. Elsworth Soho  In 4-6 months and As needed

## 2021-03-17 NOTE — Assessment & Plan Note (Signed)
Healthy weight loss 

## 2021-03-17 NOTE — Assessment & Plan Note (Signed)
Continue on O2 with Trilogy device .

## 2021-03-17 NOTE — Assessment & Plan Note (Signed)
OSA /OHS -encouraged on TRILOGY usage, long discussion regarding to use more at bedtime . She does not sleep for long increments.   Plan  Patient Instructions  Continue on TRILOGY device At bedtime with oxygen  Wear at bedtime and with naps  Chest xray today  Work on healthy weight  Do not drive if sleepy  Flu shot today .  Follow up with Dr. Elsworth Soho  In 4-6 months and As needed

## 2021-03-18 ENCOUNTER — Telehealth: Payer: Self-pay | Admitting: *Deleted

## 2021-03-18 ENCOUNTER — Telehealth: Payer: Medicare HMO | Admitting: *Deleted

## 2021-03-18 NOTE — Telephone Encounter (Signed)
  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   An unsuccessful telephone outreach was attempted today. The patient was referred to the case management team for assistance with care management and care coordination. LCSW was unable to leave a HIPAA compliant message on voicemail, mailbox is full.  LCSW will make another telephone follow-up outreach call attempt to patient, within the next 5-7 business days, if a return call is not received in the meantime.  Follow-Up Plan:  Request sent to Care Guides to schedule another follow-up telephone outreach call for patient with LCSW.  [place] LCSW Licensed Clinical Social Worker [place] [facility] [place] [phone]

## 2021-03-21 ENCOUNTER — Telehealth: Payer: Self-pay | Admitting: Adult Health

## 2021-03-21 NOTE — Telephone Encounter (Signed)
I do not have a CMN on this patient also per Melissa we are on a do not request records  since they can pull it from [employer]

## 2021-03-21 NOTE — Telephone Encounter (Signed)
Called [employer] and received a recording that my wait time was less then 65min. I have requested a call back.

## 2021-03-24 DIAGNOSIS — G4730 Sleep apnea, unspecified: Secondary | ICD-10-CM | POA: Diagnosis not present

## 2021-03-24 DIAGNOSIS — R0683 Snoring: Secondary | ICD-10-CM | POA: Diagnosis not present

## 2021-03-24 NOTE — Telephone Encounter (Signed)
Called [employer] and long hold so I have sent community msg to [place].

## 2021-03-25 ENCOUNTER — Ambulatory Visit
Admission: RE | Admit: 2021-03-25 | Discharge: 2021-03-25 | Disposition: A | Discharge status: Home / Self Care | Payer: Medicare HMO | Source: Ambulatory Visit | Attending: Physician Assistant | Admitting: Physician Assistant

## 2021-03-25 ENCOUNTER — Other Ambulatory Visit: Payer: Self-pay

## 2021-03-25 DIAGNOSIS — Z1231 Encounter for screening mammogram for malignant neoplasm of breast: Secondary | ICD-10-CM | POA: Diagnosis not present

## 2021-03-27 ENCOUNTER — Telehealth: Payer: Self-pay | Admitting: *Deleted

## 2021-03-27 ENCOUNTER — Telehealth: Payer: Medicare HMO

## 2021-03-27 NOTE — Telephone Encounter (Signed)
  Care Management   Follow Up Note   03/27/2021 Name: [name] MRN: [id number] DOB: Jan 17, 1941   Referred by: Allwardt, Randa Evens, PA-C Reason for referral : Chronic Care Management (DM, HTN)   An unsuccessful telephone outreach was attempted today. The patient was referred to the case management team for assistance with care management and care coordination.   Follow Up Plan: RNCM will seek assistance from Care Guides in rescheduling appointment within the next 30 days.  Hubert Azure RN, MSN RN Care Management Coordinator  [place] [phone] [email or web address]

## 2021-03-28 DIAGNOSIS — F329 Major depressive disorder, single episode, unspecified: Secondary | ICD-10-CM | POA: Diagnosis not present

## 2021-03-28 DIAGNOSIS — F419 Anxiety disorder, unspecified: Secondary | ICD-10-CM | POA: Diagnosis not present

## 2021-03-28 DIAGNOSIS — G4730 Sleep apnea, unspecified: Secondary | ICD-10-CM | POA: Diagnosis not present

## 2021-03-28 DIAGNOSIS — H9193 Unspecified hearing loss, bilateral: Secondary | ICD-10-CM | POA: Diagnosis not present

## 2021-03-28 DIAGNOSIS — G5602 Carpal tunnel syndrome, left upper limb: Secondary | ICD-10-CM | POA: Diagnosis not present

## 2021-03-28 DIAGNOSIS — I5030 Unspecified diastolic (congestive) heart failure: Secondary | ICD-10-CM | POA: Diagnosis not present

## 2021-03-28 DIAGNOSIS — M159 Polyosteoarthritis, unspecified: Secondary | ICD-10-CM | POA: Diagnosis not present

## 2021-03-28 DIAGNOSIS — I110 Hypertensive heart disease with heart failure: Secondary | ICD-10-CM | POA: Diagnosis not present

## 2021-03-28 DIAGNOSIS — E119 Type 2 diabetes mellitus without complications: Secondary | ICD-10-CM | POA: Diagnosis not present

## 2021-03-29 ENCOUNTER — Other Ambulatory Visit: Payer: Self-pay | Admitting: Family Medicine

## 2021-03-29 DIAGNOSIS — I10 Essential (primary) hypertension: Secondary | ICD-10-CM

## 2021-04-01 ENCOUNTER — Ambulatory Visit (INDEPENDENT_AMBULATORY_CARE_PROVIDER_SITE_OTHER): Payer: Medicare HMO | Admitting: *Deleted

## 2021-04-01 DIAGNOSIS — F330 Major depressive disorder, recurrent, mild: Secondary | ICD-10-CM

## 2021-04-01 DIAGNOSIS — R413 Other amnesia: Secondary | ICD-10-CM

## 2021-04-01 DIAGNOSIS — J9612 Chronic respiratory failure with hypercapnia: Secondary | ICD-10-CM

## 2021-04-01 DIAGNOSIS — R296 Repeated falls: Secondary | ICD-10-CM

## 2021-04-01 DIAGNOSIS — E1140 Type 2 diabetes mellitus with diabetic neuropathy, unspecified: Secondary | ICD-10-CM

## 2021-04-01 DIAGNOSIS — I152 Hypertension secondary to endocrine disorders: Secondary | ICD-10-CM

## 2021-04-01 DIAGNOSIS — I5033 Acute on chronic diastolic (congestive) heart failure: Secondary | ICD-10-CM

## 2021-04-01 DIAGNOSIS — J9611 Chronic respiratory failure with hypoxia: Secondary | ICD-10-CM

## 2021-04-01 DIAGNOSIS — M159 Polyosteoarthritis, unspecified: Secondary | ICD-10-CM

## 2021-04-01 DIAGNOSIS — I5032 Chronic diastolic (congestive) heart failure: Secondary | ICD-10-CM

## 2021-04-01 DIAGNOSIS — Z794 Long term (current) use of insulin: Secondary | ICD-10-CM

## 2021-04-02 ENCOUNTER — Telehealth: Payer: Self-pay

## 2021-04-02 NOTE — Patient Instructions (Signed)
Visit Information  Thank you for taking time to visit with me today. Please don't hesitate to contact me if I can be of assistance to you before our next scheduled telephone appointment.  No follow-up required, per patient.  If you need to cancel or re-schedule our visit, please call [phone] and our care guide team will be happy to assist you.  Following is a list of the goals we discussed today:  Current Barriers:   Patient in need of assistance with connecting to community resources for Transportation, Limited Access to Food and Limited Access to Caregiver. Patient acknowledges deficits and admits to being unable to independently navigate community resource options without care coordination support. Clinical Goals:  Explore community resource options for unmet needs related to:  SDOH: Transportation, Food Insecurities, Limited Access to Caregiver, and patient will work with [employer] and [employer] Guides to address needs. Clinical Interventions:  Collaboration with Allwardt, Randa Evens, PA-C regarding development and update of comprehensive plan of care as evidenced by provider attestation and co-signature. Inter-disciplinary care team collaboration (see longitudinal plan of care). Other interventions provided: Solution-Focused Strategies reviewed Active Listening/Reflection utilized  Emotional Support provided Problem Solving/Task Centered Solutions implemented Caregiver stress acknowledged  Consideration of in-home help encouraged  Verbalization of feelings encouraged  Patient Goals:  Please accept all calls from [place], in an effort to receive assistance and resources with obtaining transportation, prepared meal delivery services and in-home aide services.  LCSW collaboration with Community Care Guides to place new referral, as three unsuccessful outreach calls were attempted, so original referral              for services was closed. Contact LCSW directly (#  [id number]) if you have questions, need assistance, or if additional social work needs are identified in the near future.   Follow-Up Plan:  No follow-up required, per patient.  Patient verbalizes understanding of instructions provided today and agrees to view in [place].   [place] LCSW Licensed Clinical Social Worker [place] [facility] [place] [phone]

## 2021-04-02 NOTE — Chronic Care Management (AMB) (Signed)
  Care Management   Note  04/02/2021 Name: [name] MRN: [id number] DOB: 11-01-1940  [name] is a 80 y.o. year old female who is a primary care patient of Allwardt, Randa Evens, PA-C and is actively engaged with the care management team. I reached out to [name] by phone today to assist with re-scheduling a follow up visit with the RN Case Manager  Follow up plan: Unsuccessful telephone outreach attempt made.  The care management team will reach out to the patient again over the next 7 days.  If patient returns call to provider office, please advise to call [facility]  at [place], [place], [place], [place] [address] Direct Dial: [phone] [email or web address] Website: [email or web address]

## 2021-04-02 NOTE — Chronic Care Management (AMB) (Signed)
Chronic Care Management    Clinical Social Work Note  04/02/2021 Name: [name] MRN: [id number] DOB: 10/03/1940  [name] is a 80 y.o. year old female who is a primary care patient of Allwardt, Alyssa M, PA-C. The CCM team was consulted to assist the patient with chronic disease management and/or care coordination needs related to: Transportation Needs, [employer], Food Insecurity, and Level of Care Concerns.   Engaged with patient by telephone for follow-up visit in response to provider referral for social work chronic care management and care coordination services.   Consent to Services:  The patient was given information about Chronic Care Management services, agreed to services, and gave verbal consent prior to initiation of services.  Please see initial visit note for detailed documentation.   Patient agreed to services and consent obtained.   Assessment: Review of patient past medical history, allergies, medications, and health status, including review of relevant consultants reports was performed today as part of a comprehensive evaluation and provision of chronic care management and care coordination services.     SDOH (Social Determinants of Health) assessments and interventions performed:    Advanced Directives Status: Not addressed in this encounter.  CCM Care Plan  Allergies  Allergen Reactions   Sulfa Antibiotics Swelling     Facial Swelling "swelling lips"    Sulfonamide Derivatives Swelling    Lips Swelling   Other     BLOOD PRODUCT REFUSAL    Outpatient Encounter Medications as of 04/01/2021  Medication Sig Note   Ascorbic Acid (VITAMIN C PO) Take 2 tablets by mouth daily. (Patient not taking: Reported on 03/17/2021)    blood glucose meter kit and supplies Dispense based on patient and insurance preference. Use up to two  times daily as directed. (FOR ICD-10 E10.9, E11.9).    Calcium Citrate-Vitamin D (CALCIUM + D PO) Take 2 tablets by mouth  daily. (Patient not taking: Reported on 03/17/2021)    carvedilol (COREG) 3.125 MG tablet TAKE 1 TABLET TWICE DAILY WITH A MEAL    Cholecalciferol (VITAMIN D3) 50 MCG (2000 UT) CHEW Chew 4,000 Units by mouth daily. (Patient not taking: No sig reported)    Cyanocobalamin (B-12 PO) Take 2 tablets by mouth daily.    furosemide (LASIX) 80 MG tablet Take 1 tablet (80 mg total) by mouth 3 (three) times daily. 03/04/2021: Reports has been changed to twice a day.   HYDROcodone-acetaminophen (NORCO/VICODIN) 5-325 MG tablet Take 1 tablet by mouth 2 (two) times daily as needed for moderate pain.    insulin glargine (LANTUS SOLOSTAR) 100 UNIT/ML Solostar Pen Inject 25 Units into the skin at bedtime.    Insulin Pen Needle (DROPLET PEN NEEDLES) 32G X 4 MM MISC USE AS DIRECTED ONE TIME DAILY    lisinopril (ZESTRIL) 20 MG tablet Take 1 tablet (20 mg total) by mouth daily.    metFORMIN (GLUCOPHAGE) 1000 MG tablet Take 1 tablet (1,000 mg total) by mouth in the morning and at bedtime.    potassium chloride SA (KLOR-CON) 20 MEQ tablet Take 1 tablet (20 mEq total) by mouth 3 (three) times daily.    pramipexole (MIRAPEX) 1 MG tablet TAKE 1 TABLET TWICE DAILY    triamcinolone cream (KENALOG) 0.1 % Apply 1 application topically 2 (two) times daily as needed (redness). Apply to red areas on legs    No facility-administered encounter medications on file as of 04/01/2021.    Patient Active Problem List   Diagnosis Date Noted   Persistent atrial  fibrillation ([place]) 09/26/2020   Educated about COVID-19 virus infection 11/30/2019   Chronic respiratory failure with hypoxia and hypercapnia ([place]) 07/11/2019   Acute on chronic diastolic CHF (congestive heart failure) ([place]) 07/08/2019   Hypoxia 07/07/2019   Hypokalemia 05/03/2019   Facial asymmetry, acquired 08/20/2016   Fracture of knee prosthesis ([place]) 08/01/2016   Obesity 02/20/2016   Chronic pain syndrome 02/19/2016   Mild episode of recurrent major depressive disorder  ([place]) 08/02/2015   Chronic diastolic congestive heart failure ([place]) 07/16/2015   Type 2 diabetes mellitus with diabetic neuropathy ([place]) 09/27/2014   Hearing loss - has hearing aides, follow by audiologist 10/11/2013   No blood products ([place] [facility])  09/11/2013   Disorder of bone and cartilage 06/10/2010   Breast cancer of upper-outer quadrant of right female breast ([place]) 04/04/2010   RESTLESS LEGS SYNDROME 05/05/2007   Obstructive sleep apnea 03/01/2007   Essential hypertension 01/27/2007   Osteoarthritis 01/27/2007    Conditions to be addressed/monitored: HTN and DMII.  Film/video editor, Limited Social Support, Transportation, Limited Access to [employer], Level of Care Concerns, ADL/IADL Limitations, Social Isolation, Limited Access to Building control surveyor, and Teacher, English as a foreign language of [employer].  Care Plan : LCSW Plan of Care  Updates made by Francis Gaines, LCSW since 04/02/2021 12:00 AM     Problem: Maintain My Quality of Life. Resolved 04/01/2021  Priority: High     Goal: Maintain My Quality of Life. Completed 04/01/2021  Start Date: 02/14/2021  Expected End Date: 04/01/2021  This Visit's Progress: On track  Recent Progress: On track  Priority: High  Note:   Current Barriers:   Patient in need of assistance with connecting to community resources for Transportation, Limited Access to [employer] and Limited Access to Caregiver. Patient acknowledges deficits and admits to being unable to independently navigate community resource options without care coordination support. Clinical Goals:  Explore community resource options for unmet needs related to:  SDOH: Transportation, Food Insecurities, Limited Access to Caregiver, and patient will work with [employer] and [employer] Guides to address needs. Clinical Interventions:  Collaboration with Allwardt, Randa Evens, PA-C regarding development and update of comprehensive plan of care as evidenced by provider attestation and  co-signature. Inter-disciplinary care team collaboration (see longitudinal plan of care). Other interventions provided: Solution-Focused Strategies reviewed Active Listening/Reflection utilized  Emotional Support provided Problem Solving/Task Centered Solutions implemented Caregiver stress acknowledged  Consideration of in-home help encouraged  Verbalization of feelings encouraged  Patient Goals:  Please accept all calls from [place], in an effort to receive assistance and resources with obtaining transportation, prepared meal delivery services and in-home aide services.  LCSW collaboration with Community Care Guides to place new referral, as three unsuccessful outreach calls were attempted, so original referral              for services was closed. Contact LCSW directly (# [id number]) if you have questions, need assistance, or if additional social work needs are identified in the near future.   Follow-Up Plan:  No follow-up required, per patient.   [place] LCSW Licensed Clinical Social Worker [place] [facility] [place] [phone]

## 2021-04-04 ENCOUNTER — Telehealth: Payer: Self-pay | Admitting: *Deleted

## 2021-04-04 NOTE — Telephone Encounter (Signed)
   Telephone encounter was:  Unsuccessful.  04/04/2021 Name: [name] MRN: [id number] DOB: [birth year]  Unsuccessful outbound call made today to assist with:  Food Insecurity  Outreach Attempt:  1st Attempt  Mailbox is full and cannot accept messages at this time    [place], Care Management  [phone] [address] , [place] [address] Email : [name]

## 2021-04-08 NOTE — Telephone Encounter (Signed)
[name], please advise if a CMN was ever received on pt.

## 2021-04-08 NOTE — Telephone Encounter (Signed)
Nope I have not got anything on this patient

## 2021-04-08 NOTE — Telephone Encounter (Signed)
Sent community message to [place] with [employer]. Not currently understanding why notes from [place] on 03/17/21 and CMN is required when order placed on 03/17/21 was for ONO. Awaiting Melissa's response.

## 2021-04-09 NOTE — Chronic Care Management (AMB) (Signed)
  Care Management   Note  04/09/2021 Name: [name] MRN: [id number] DOB: Aug 27, 1940  ERNESTO LASHWAY is a 80 y.o. year old female who is a primary care patient of Allwardt, Randa Evens, PA-C and is actively engaged with the care management team. I reached out to [name] by phone today to assist with re-scheduling a follow up visit with the RN Case Manager  Follow up plan: Unsuccessful telephone outreach attempt made. The care management team will reach out to the patient again over the next 7 days.  If patient returns call to provider office, please advise to call [facility]  at [address], [place], [place], [place] [address] Direct Dial: [phone] [name] Website: [email or web address]

## 2021-04-11 NOTE — Telephone Encounter (Signed)
Called and spoke with Melissa to see if she can help me with trying to see what this patient needs since this message has been open for 3 weeks now. She asked me to send a community message to her, Leroy Sea and Andee Poles. Community message has been sent.

## 2021-04-11 NOTE — Telephone Encounter (Signed)
RE: Trying to figure out what this patient needs Received: Today [place], Sharene Butters, [place], [place]; Darlina Guys; [place], Laverta Baltimore, [facility],   I am not real sure how to answer this question as there are several notes and patient has a couple of products with [place]. I messaged al l the product line teams and billing for answers to provide to you.  I have asked them to reply to me so I can let you know what I hear back. Claudette Head and myself are on the message so we should be able to communicate back to you as soon as we get details.   Thank you,   Demetrius Charity

## 2021-04-14 ENCOUNTER — Telehealth: Payer: Self-pay | Admitting: Pharmacist

## 2021-04-14 NOTE — Chronic Care Management (AMB) (Addendum)
Chronic Care Management Pharmacy Assistant   Name: KALINA MORABITO  MRN: [id number] DOB: [birth year]   Reason for Encounter: Medication Coordination Call    Recent office visits:  None  Recent consult visits:  03/17/2021 OV (pulmonology) Parrett, Fonnie Mu, NP; no medication changes indicated.  Hospital visits:  None in previous 6 months  Medications: Outpatient Encounter Medications as of 04/14/2021  Medication Sig Note   Ascorbic Acid (VITAMIN C PO) Take 2 tablets by mouth daily. (Patient not taking: Reported on 03/17/2021)    blood glucose meter kit and supplies Dispense based on patient and insurance preference. Use up to two  times daily as directed. (FOR ICD-10 E10.9, E11.9).    Calcium Citrate-Vitamin D (CALCIUM + D PO) Take 2 tablets by mouth daily. (Patient not taking: Reported on 03/17/2021)    carvedilol (COREG) 3.125 MG tablet TAKE 1 TABLET TWICE DAILY WITH A MEAL    Cholecalciferol (VITAMIN D3) 50 MCG (2000 UT) CHEW Chew 4,000 Units by mouth daily. (Patient not taking: No sig reported)    Cyanocobalamin (B-12 PO) Take 2 tablets by mouth daily.    furosemide (LASIX) 80 MG tablet Take 1 tablet (80 mg total) by mouth 3 (three) times daily. 03/04/2021: Reports has been changed to twice a day.   HYDROcodone-acetaminophen (NORCO/VICODIN) 5-325 MG tablet Take 1 tablet by mouth 2 (two) times daily as needed for moderate pain.    insulin glargine (LANTUS SOLOSTAR) 100 UNIT/ML Solostar Pen Inject 25 Units into the skin at bedtime.    Insulin Pen Needle (DROPLET PEN NEEDLES) 32G X 4 MM MISC USE AS DIRECTED ONE TIME DAILY    lisinopril (ZESTRIL) 20 MG tablet Take 1 tablet (20 mg total) by mouth daily.    metFORMIN (GLUCOPHAGE) 1000 MG tablet Take 1 tablet (1,000 mg total) by mouth in the morning and at bedtime.    potassium chloride SA (KLOR-CON) 20 MEQ tablet Take 1 tablet (20 mEq total) by mouth 3 (three) times daily.    pramipexole (MIRAPEX) 1 MG tablet TAKE 1 TABLET TWICE DAILY     triamcinolone cream (KENALOG) 0.1 % Apply 1 application topically 2 (two) times daily as needed (redness). Apply to red areas on legs    No facility-administered encounter medications on file as of 04/14/2021.    Reviewed chart for medication changes ahead of medication coordination call.  No OVs, Consults, or hospital visits since last care coordination call/Pharmacist visit. (If appropriate, list visit date, provider name)  No medication changes indicated OR if recent visit, treatment plan here.  BP Readings from Last 3 Encounters:  03/17/21 (!) 142/80  02/10/21 131/85  11/20/20 (!) 158/92    Lab Results  Component Value Date   HGBA1C 7.4 (H) 02/10/2021     Patient obtains medications through Adherence Packaging  30days   Patient is due for next adherence delivery on: 04/22/2021. Called patient and reviewed medications and coordinated delivery.  This delivery to include: Furosemide 80 mg one at breakfast one at evening meal Carvedilol 3.125 mg one at breakfast one at evening meal Metformin 1000 mg one at breakfast one at evening meal Potassium 20 meq one at breakfast one at evening meal Pramipexole 1 mg one at breakfast one at evening meal Lisinopril 20 mg one at breakfast one at evening meal Lantus Pen 20 units at bedtime   Confirmed delivery date of 04/22/2021, advised patient that pharmacy will contact them the morning of delivery.   April D Calhoun, [place] Pharmacist Assistant  [phone]   12 minutes spent in review, coordination, and documentation.  Reviewed by: Beverly Milch, PharmD Clinical Pharmacist [phone]

## 2021-04-16 DIAGNOSIS — E1159 Type 2 diabetes mellitus with other circulatory complications: Secondary | ICD-10-CM

## 2021-04-16 DIAGNOSIS — I152 Hypertension secondary to endocrine disorders: Secondary | ICD-10-CM

## 2021-04-16 DIAGNOSIS — I5033 Acute on chronic diastolic (congestive) heart failure: Secondary | ICD-10-CM

## 2021-04-16 DIAGNOSIS — E1140 Type 2 diabetes mellitus with diabetic neuropathy, unspecified: Secondary | ICD-10-CM

## 2021-04-16 DIAGNOSIS — Z794 Long term (current) use of insulin: Secondary | ICD-10-CM

## 2021-04-16 DIAGNOSIS — M159 Polyosteoarthritis, unspecified: Secondary | ICD-10-CM

## 2021-04-16 DIAGNOSIS — F330 Major depressive disorder, recurrent, mild: Secondary | ICD-10-CM

## 2021-04-16 DIAGNOSIS — I5032 Chronic diastolic (congestive) heart failure: Secondary | ICD-10-CM

## 2021-04-16 NOTE — Telephone Encounter (Signed)
RE: Trying to figure out what this patient needs Received: Today New, Sharene Butters, [place], [place]; [place], [place]; [place], Tracie Harrier, [facility],   I have not received a reply back at this time other than this request was made by our vent team. I have sent another message and included Melissa and Andee Poles as I will be out of office until next Tuesday.  As soon as I get the info I will pass it on to you.   Thank you,   Demetrius Charity

## 2021-04-16 NOTE — Telephone Encounter (Signed)
Community message sent to [facility] [address] to ask about a follow up

## 2021-04-17 NOTE — Progress Notes (Signed)
Cardiology Office Note   Date:  04/18/2021   ID:  [name], DOB [birth year], MRN [id number]  PCP:  Fredirick Lathe, PA-C  Cardiologist: Dr. Percival Spanish CC: Follow Up   History of Present Illness: [name] is a 80 y.o. female who presents for ongoing assessment and management of chronic diastolic dysfunction, atrial fibrillation, history of frequent falls, OSA on BiPAP at night.  Was last seen by Dr. Percival Spanish on 09/27/2020, with the patient's daughter accompanying her to the office visit.  A long discussion about need for anticoagulation therapy was had with the patient and her daughter (who lives in [place]).    Due to CHA2DS2-VASc score of 6 she was continued on anticoagulation therapy.  She was not found to be volume overloaded and her blood pressure was well controlled.  There were no changes to her medications or follow-up testing planned with the exception of a BMET and a CBC.  She comes today feeling abdominal fullness and having some weight gain.  She also reported that she stopped taking Eliquis July or August of this year on her own.  She states that her niece fell hit her head and died on the medication and as a result of that she is refusing to take it.  She admits to eating out daily with some high salt foods.  She had also been on Lasix 80 mg 3 times daily but states that her primary care told her that she only needed to take it twice a day.  She has a pill pack which is preloaded for her and she took out one of the doses of Lasix.  Is with her husband and does have some confusion concerning which medications she is taking and also feelings of irregular heart rhythm.  She states that she does not move around very much kind of an active using a walker for ambulation.   Past Medical History:  Diagnosis Date   AK (actinic keratosis)    Anxiety    occasional   Breast cancer ([place])    R breast, s/p radiation 34Gy/52f 04/29/10-05/05/10   CTS (carpal tunnel syndrome)    Left    Diabetes ([facility])    Diastolic CHF ([facility]    Dx w/ hospitalization 06/2015 for respiratory failure   Facial asymmetry, acquired 08/20/2016   From injury   Falls frequently    "can not pick left leg up"   Hearing loss    bilateral hearing aides   History of environmental allergies    History of kidney stones 20 years ago   Hypertension    MDD (major depressive disorder)    No blood products ([employer])  09/11/2013   Osteoarthritis    hx shoulder, knee, back, wrist pain; saw Dr. AWynelle Link   Personal history of radiation therapy    RESTLESS LEGS SYNDROME 05/05/2007   Qualifier: Diagnosis of  By: CGwenette GreetMD, KArmando Reichert   RLS (restless legs syndrome)    Sleep apnea     Past Surgical History:  Procedure Laterality Date   APPENDECTOMY  age 89  BREAST LUMPECTOMY Right 2012   BREAST SURGERY  2012   rt breast lumpectomy   CATARACT EXTRACTION W/PHACO Left 08/12/2020   Procedure: CATARACT EXTRACTION PHACO AND INTRAOCULAR LENS PLACEMENT LEFT EYE;  Surgeon: WBaruch Goldmann MD;  Location: [facility];  Service: Ophthalmology;  Laterality: Left;  left CDE:13.68   CATARACT EXTRACTION W/PHACO Right 08/26/2020   Procedure: CATARACT EXTRACTION PHACO AND INTRAOCULAR LENS PLACEMENT  RIGHT EYE;  Surgeon: Baruch Goldmann, MD;  Location: [facility];  Service: Ophthalmology;  Laterality: Right;  right CDE=12.48   flex signoidoscopy     INCISION AND DRAINAGE PERIRECTAL ABSCESS     JOINT REPLACEMENT Right 2006   knee   REPLACEMENT TOTAL KNEE Right    TOTAL KNEE ARTHROPLASTY Left 08/06/2014   Procedure: LEFT TOTAL KNEE ARTHROPLASTY;  Surgeon: Gaynelle Arabian, MD;  Location: [facility];  Service: Orthopedics;  Laterality: Left;   TUBAL LIGATION  1979     Current Outpatient Medications  Medication Sig Dispense Refill   aspirin EC 325 MG tablet Take 1 tablet (325 mg total) by mouth daily. 30 tablet 0   blood glucose meter kit and supplies Dispense based on patient and insurance preference. Use up to two  times daily as  directed. (FOR ICD-10 E10.9, E11.9). 1 each 0   carvedilol (COREG) 3.125 MG tablet TAKE 1 TABLET TWICE DAILY WITH A MEAL 180 tablet 1   Cholecalciferol (VITAMIN D3) 50 MCG (2000 UT) CHEW Chew 4,000 Units by mouth daily.     Cyanocobalamin (B-12 PO) Take 2 tablets by mouth daily.     furosemide (LASIX) 80 MG tablet Take 1 tablet (80 mg total) by mouth 3 (three) times daily. 270 tablet 0   HYDROcodone-acetaminophen (NORCO/VICODIN) 5-325 MG tablet Take 1 tablet by mouth 2 (two) times daily as needed for moderate pain.     insulin glargine (LANTUS SOLOSTAR) 100 UNIT/ML Solostar Pen Inject 25 Units into the skin at bedtime. 30 mL 2   Insulin Pen Needle (DROPLET PEN NEEDLES) 32G X 4 MM MISC USE AS DIRECTED ONE TIME DAILY 100 each 6   lisinopril (ZESTRIL) 20 MG tablet Take 1 tablet (20 mg total) by mouth daily. 90 tablet 2   metFORMIN (GLUCOPHAGE) 1000 MG tablet Take 1 tablet (1,000 mg total) by mouth in the morning and at bedtime. 180 tablet 0   potassium chloride SA (KLOR-CON) 20 MEQ tablet Take 1 tablet (20 mEq total) by mouth 3 (three) times daily. 90 tablet 2   pramipexole (MIRAPEX) 1 MG tablet TAKE 1 TABLET TWICE DAILY 180 tablet 0   triamcinolone cream (KENALOG) 0.1 % Apply 1 application topically 2 (two) times daily as needed (redness). Apply to red areas on legs 45 g 1   No current facility-administered medications for this visit.    Allergies:   Sulfa antibiotics, Sulfonamide derivatives, and Other    Social History:  The patient  reports that she has never smoked. She has never been exposed to tobacco smoke. She has never used smokeless tobacco. She reports that she does not drink alcohol and does not use drugs.   Family History:  The patient's family history includes Alcohol abuse in her father; Cancer in her sister; Diabetes in an other family member; Heart disease in her brother and paternal grandfather; Obesity in an other family member.    ROS: All other systems are reviewed and  negative. Unless otherwise mentioned in H&P    PHYSICAL EXAM: VS:  BP (!) 142/78   Pulse 97   Ht _0  (1.549 m)   Wt 179 lb (81.2 kg)   SpO2 97%   BMI 33.82 kg/m  , BMI Body mass index is 33.82 kg/m. GEN: Well nourished, well developed, in no acute distress, obese HEENT: normal Neck: no JVD, carotid bruits, or masses Cardiac: IRRR; 1/6 systolic murmurs, rubs, or gallops,no edema  Respiratory:  Clear to auscultation bilaterally, normal work of breathing GI:  soft, nontender, nondistended, + BS MS: no deformity or atrophy, kyphosis is noted with deconditioning Skin: warm and dry, no rash Neuro:  Strength and sensation are intact Psych: euthymic mood, full affect   EKG:  EKG is ordered today. The ekg ordered today demonstrates (personally reviewed) atrial fibrillation with PVCs, inferior Q waves are noted, heart rate of 97 bpm.   Recent Labs: 02/10/2021: ALT 12; BUN 18; Creatinine, Ser 0.81; Hemoglobin 13.8; Platelets 241.0; Potassium 3.2; Sodium 140; TSH 1.69    Lipid Panel    Component Value Date/Time   CHOL 101 06/11/2020 1032   TRIG 95.0 06/11/2020 1032   HDL 31.40 (L) 06/11/2020 1032   CHOLHDL 3 06/11/2020 1032   VLDL 19.0 06/11/2020 1032   LDLCALC 51 06/11/2020 1032   LDLDIRECT 111.6 02/14/2010 1406      Wt Readings from Last 3 Encounters:  04/18/21 179 lb (81.2 kg)  03/17/21 193 lb 3.2 oz (87.6 kg)  03/07/21 192 lb 9.6 oz (87.4 kg)      Other studies Reviewed: Echo:  06/2019    1. Left ventricular ejection fraction, by estimation, is 60 to 65%. The  left ventricle has normal function. The left ventricle has no regional  wall motion abnormalities. There is mild to moderate left ventricular  hypertrophy. Left ventricular diastolic  parameters are consistent with Grade I diastolic dysfunction (impaired  relaxation).   2. Right ventricular systolic function is normal. The right ventricular  size is normal. Tricuspid regurgitation signal is inadequate for  assessing  PA pressure.   3. The mitral valve is grossly normal, there is mild to moderate annular  calcification. Trivial mitral valve regurgitation.   4. The aortic valve was not well visualized. Mild aortic leaflet  calcification. Aortic valve regurgitation is mild.   5. IVC dilated, but not able to estimate CVP (no sniff).  ASSESSMENT AND PLAN:  1.  Atrial Fib: Remains in atrial fibrillation and stopped taking her Eliquis as she had a family member on Eliquis fall hit her head and die and it scared her.  I have seen talked with her about this and her increased stroke risk and she is willing to take the risk.  I have started her on enteric-coated aspirin 325 mg daily for some coverage although suboptimal.  She is willing to start taking this.  I have asked her to think about restarting Eliquis and to avoid the potential to have a major cardiovascular event.  She will remain on carvedilol 3.125 mg twice daily.  She is slightly tachycardic today but on auscultation heart rate is normalized.  No changes in his status.  2.  Diastolic CHF.  Although her weight is down her abdominal girth is tight she does have ballottement and some lower extremity edema.  She has some early satiety and eats a high salt diet.  She is not always consistent in taking her medications and sometimes does not always take her morning meds.  She is taken out the third dose of her Lasix as she had been on 80 mg 3 times daily.  I have asked her to take 2-80 mg tablets in the morning on an empty stomach and the evening dose, for 3 days and then go back to her normal dose.  I will check a BMET and a CBC today.  3.  Type 2 diabetes: She does not always check her blood sugars, and admits to "grazing at night" because she gets hungry.  She needs to follow-up with her  PCP to make sure that her blood sugar is well controlled.  4.  Self-care deficiency: Her husband is frail and is unable to help her much at home with her medications.   I have suggested to her about having home health services come in to assist her with her medications and maybe increase compliance.  She wishes to think about this.  She states she had it in the past but eventually dismissed to them.  She should speak with her primary care physician about reinstating these as I worry about her medication noncompliance and her husband's frailty not being able to contribute to her care or his own.  Current medicines are reviewed at length with the patient today.  I have spent 45 min's dedicated to the care of this patient on the date of this encounter to include pre-visit review of records, assessment, management and diagnostic testing,with shared decision making.  Labs/ tests ordered today include: CBC BMET Phill Myron. West Pugh, ANP, AACC   04/18/2021 4:59 PM    [facility] [address] Office [phone] Fax [phone]  Notice: This dictation was prepared with Dragon dictation along with smaller phrase technology. Any transcriptional errors that result from this process are unintentional and may not be corrected upon review.

## 2021-04-18 ENCOUNTER — Other Ambulatory Visit: Payer: Self-pay

## 2021-04-18 ENCOUNTER — Encounter: Payer: Self-pay | Admitting: Adult Health

## 2021-04-18 ENCOUNTER — Ambulatory Visit: Payer: Medicare HMO | Admitting: Adult Health

## 2021-04-18 ENCOUNTER — Telehealth: Payer: Self-pay | Admitting: *Deleted

## 2021-04-18 VITALS — BP 142/78 | HR 97 | Ht 61.0 in | Wt 179.0 lb

## 2021-04-18 DIAGNOSIS — Z9189 Other specified personal risk factors, not elsewhere classified: Secondary | ICD-10-CM | POA: Diagnosis not present

## 2021-04-18 DIAGNOSIS — I4819 Other persistent atrial fibrillation: Secondary | ICD-10-CM | POA: Diagnosis not present

## 2021-04-18 DIAGNOSIS — I5032 Chronic diastolic (congestive) heart failure: Secondary | ICD-10-CM | POA: Diagnosis not present

## 2021-04-18 MED ORDER — ASPIRIN EC 325 MG PO TBEC: 325.0000 mg | DELAYED_RELEASE_TABLET | Freq: Every day | ORAL | 0 refills | Status: DC

## 2021-04-18 NOTE — Telephone Encounter (Signed)
   Telephone encounter was:  Unsuccessful.  04/18/2021 Name: [name] MRN: [id number] DOB: 1940/12/14  Unsuccessful outbound call made today to assist with:   handyman list   Outreach Attempt:  2nd Attempt  Mailbox full  [place], Care Management  [phone] [address] , [place] [address] Email : [name]

## 2021-04-18 NOTE — Patient Instructions (Signed)
Medication Instructions:  Start Aspirin 325 mg ( Take 1 Tablet Daily). Furosemide 80 mg ( Take 2 Tablet in the Morning on a Empty Stomach. Take 1 80 mg Tablet in the Evening). *If you need a refill on your cardiac medications before your next appointment, please call your pharmacy*   Lab Work: BMET,CBC. : Today If you have labs (blood work) drawn today and your tests are completely normal, you will receive your results only by: [place] (if you have MyChart) OR A paper copy in the mail If you have any lab test that is abnormal or we need to change your treatment, we will call you to review the results.   Testing/Procedures: No Testing   Follow-Up: At [facility], you and your health needs are our priority.  As part of our continuing mission to provide you with exceptional heart care, we have created designated Provider Care Teams.  These Care Teams include your primary Cardiologist (physician) and Advanced Practice Providers (APPs -  Physician Assistants and Nurse Practitioners) who all work together to provide you with the care you need, when you need it.  We recommend signing up for the patient portal called "MyChart".  Sign up information is provided on this After Visit Summary.  MyChart is used to connect with patients for Virtual Visits (Telemedicine).  Patients are able to view lab/test results, encounter notes, upcoming appointments, etc.  Non-urgent messages can be sent to your provider as well.   To learn more about what you can do with MyChart, go to [email or web address].    Your next appointment:   1 month  The format for your next appointment:   In Person  Provider:   Jory Sims, DNP, ANP    Then, Minus Breeding, MD will plan to see you again in 3 month(s).    Other Instructions Take Weight Daily.

## 2021-04-19 LAB — BASIC METABOLIC PANEL
BUN/Creatinine Ratio: 15 (ref 12–28)
BUN: 14 mg/dL (ref 8–27)
CO2: 29 mmol/L (ref 20–29)
Calcium: 8.9 mg/dL (ref 8.7–10.3)
Chloride: 100 mmol/L (ref 96–106)
Creatinine, Ser: 0.94 mg/dL (ref 0.57–1.00)
Glucose: 85 mg/dL (ref 70–99)
Potassium: 3 mmol/L — ABNORMAL LOW (ref 3.5–5.2)
Sodium: 145 mmol/L — ABNORMAL HIGH (ref 134–144)
eGFR: 61 mL/min/{1.73_m2} (ref >59)

## 2021-04-19 LAB — CBC
Hematocrit: 40.5 % (ref 34.0–46.6)
Hemoglobin: 13.7 g/dL (ref 11.1–15.9)
MCH: 31.7 pg (ref 26.6–33.0)
MCHC: 33.8 g/dL (ref 31.5–35.7)
MCV: 94 fL (ref 79–97)
Platelets: 233 10*3/uL (ref 150–450)
RBC: 4.32 x10E6/uL (ref 3.77–5.28)
RDW: 12.9 % (ref 11.7–15.4)
WBC: 11 10*3/uL — ABNORMAL HIGH (ref 3.4–10.8)

## 2021-04-22 NOTE — Chronic Care Management (AMB) (Signed)
  Care Management   Note  04/22/2021 Name: TYLIE GOLONKA MRN: [id number] DOB: Oct 09, 1940  JENASIA DOLINAR is a 80 y.o. year old female who is a primary care patient of Allwardt, Randa Evens, PA-C and is actively engaged with the care management team. I reached out to [name] by phone today to assist with re-scheduling a follow up visit with the RN Case Manager  Follow up plan: Unable to make contact on outreach attempts x 3. PCP Allwardt, Randa Evens, PA-C notified via routed documentation in medical record.   Noreene Larsson, [place], [place], [place] [address] Direct Dial: [phone] [email or web address]

## 2021-04-22 NOTE — Telephone Encounter (Signed)
3rd unsuccessful outreach  

## 2021-04-23 ENCOUNTER — Telehealth: Payer: Self-pay | Admitting: *Deleted

## 2021-04-23 ENCOUNTER — Telehealth: Payer: Self-pay

## 2021-04-23 NOTE — Telephone Encounter (Signed)
   Telephone encounter was:  Unsuccessful.  04/23/2021 Name: [name] MRN: [id number] DOB: [birth year]  Unsuccessful outbound call made today to assist with:  Transportation Needs   Outreach Attempt:  3rd Attempt.  Referral closed unable to contact patient.  Mailbox is full unable to accept messages at this time [place], Care Management  [phone] [address] , [place] [address] Email : [name]

## 2021-04-23 NOTE — Telephone Encounter (Addendum)
Called patient regarding results . Unable to leave message.----- Message from Lendon Colonel, NP sent at 04/19/2021  9:35 AM EST ----- Potassium was low and 3.0. She is on potassium, but does not take it regularly because the pill is too big. I asked her to crush her potassium pills and mix them in water or and yogurt. Please remind her to take her potassium as directed 20 mil equivalents daily.  KL

## 2021-04-24 ENCOUNTER — Telehealth: Payer: Self-pay

## 2021-04-24 NOTE — Telephone Encounter (Addendum)
Called patient regarding lab results. Advised patient should be taking 20 mew of potassium daily . Advised patient per Curt Bears can crush and take with yogurt or water.----- Message from Lendon Colonel, NP sent at 04/19/2021  9:35 AM EST ----- Potassium was low and 3.0. She is on potassium, but does not take it regularly because the pill is too big. I asked her to crush her potassium pills and mix them in water or and yogurt. Please remind her to take her potassium as directed 20 mil equivalents daily.  KL

## 2021-04-29 NOTE — Telephone Encounter (Signed)
Reached back out to [place] with [employer] to ask for an update.

## 2021-04-30 DIAGNOSIS — F40240 Claustrophobia: Secondary | ICD-10-CM | POA: Diagnosis not present

## 2021-04-30 DIAGNOSIS — M5459 Other low back pain: Secondary | ICD-10-CM | POA: Diagnosis not present

## 2021-04-30 DIAGNOSIS — Z79891 Long term (current) use of opiate analgesic: Secondary | ICD-10-CM | POA: Diagnosis not present

## 2021-04-30 DIAGNOSIS — M5416 Radiculopathy, lumbar region: Secondary | ICD-10-CM | POA: Diagnosis not present

## 2021-05-02 ENCOUNTER — Telehealth: Payer: Self-pay | Admitting: *Deleted

## 2021-05-02 ENCOUNTER — Ambulatory Visit: Payer: Medicare HMO | Admitting: Physician Assistant

## 2021-05-02 NOTE — Telephone Encounter (Signed)
Spoke to pt asked her if she is having any new pain? Pt said no same pain she has been having. Told her need to cancel appt with Aldona Bar today due to she does not know you well and Alyssa said she will see you on Monday. She has an opening at 1:30. Pt verbalized understanding. Appt scheduled for Monday at 1:30 with Alyssa. Pt verbalized understanding.

## 2021-05-05 ENCOUNTER — Emergency Department (HOSPITAL_BASED_OUTPATIENT_CLINIC_OR_DEPARTMENT_OTHER): Payer: Medicare HMO

## 2021-05-05 ENCOUNTER — Other Ambulatory Visit: Payer: Self-pay

## 2021-05-05 ENCOUNTER — Ambulatory Visit (INDEPENDENT_AMBULATORY_CARE_PROVIDER_SITE_OTHER): Payer: Medicare HMO | Admitting: Physician Assistant

## 2021-05-05 ENCOUNTER — Encounter (HOSPITAL_BASED_OUTPATIENT_CLINIC_OR_DEPARTMENT_OTHER): Payer: Self-pay

## 2021-05-05 ENCOUNTER — Encounter: Payer: Self-pay | Admitting: Physician Assistant

## 2021-05-05 ENCOUNTER — Emergency Department (HOSPITAL_BASED_OUTPATIENT_CLINIC_OR_DEPARTMENT_OTHER): Payer: Medicare HMO | Admitting: Radiology

## 2021-05-05 ENCOUNTER — Emergency Department (HOSPITAL_BASED_OUTPATIENT_CLINIC_OR_DEPARTMENT_OTHER)
Admission: EM | Admit: 2021-05-05 | Discharge: 2021-05-05 | Disposition: A | Discharge status: Home / Self Care | Payer: Medicare HMO | Attending: Emergency Medicine | Admitting: Emergency Medicine

## 2021-05-05 VITALS — BP 154/96 | HR 76 | Temp 97.7°F | Ht 61.0 in | Wt 190.2 lb

## 2021-05-05 DIAGNOSIS — I5033 Acute on chronic diastolic (congestive) heart failure: Secondary | ICD-10-CM | POA: Insufficient documentation

## 2021-05-05 DIAGNOSIS — Z79899 Other long term (current) drug therapy: Secondary | ICD-10-CM | POA: Diagnosis not present

## 2021-05-05 DIAGNOSIS — R0600 Dyspnea, unspecified: Secondary | ICD-10-CM | POA: Diagnosis not present

## 2021-05-05 DIAGNOSIS — S0031XA Abrasion of nose, initial encounter: Secondary | ICD-10-CM | POA: Insufficient documentation

## 2021-05-05 DIAGNOSIS — Z794 Long term (current) use of insulin: Secondary | ICD-10-CM | POA: Insufficient documentation

## 2021-05-05 DIAGNOSIS — M79671 Pain in right foot: Secondary | ICD-10-CM

## 2021-05-05 DIAGNOSIS — R4182 Altered mental status, unspecified: Secondary | ICD-10-CM | POA: Insufficient documentation

## 2021-05-05 DIAGNOSIS — S92321A Displaced fracture of second metatarsal bone, right foot, initial encounter for closed fracture: Secondary | ICD-10-CM | POA: Diagnosis not present

## 2021-05-05 DIAGNOSIS — Z7982 Long term (current) use of aspirin: Secondary | ICD-10-CM | POA: Diagnosis not present

## 2021-05-05 DIAGNOSIS — Z96653 Presence of artificial knee joint, bilateral: Secondary | ICD-10-CM | POA: Diagnosis not present

## 2021-05-05 DIAGNOSIS — R410 Disorientation, unspecified: Secondary | ICD-10-CM | POA: Diagnosis not present

## 2021-05-05 DIAGNOSIS — S92324A Nondisplaced fracture of second metatarsal bone, right foot, initial encounter for closed fracture: Secondary | ICD-10-CM | POA: Diagnosis not present

## 2021-05-05 DIAGNOSIS — S99921A Unspecified injury of right foot, initial encounter: Secondary | ICD-10-CM

## 2021-05-05 DIAGNOSIS — R296 Repeated falls: Secondary | ICD-10-CM

## 2021-05-05 DIAGNOSIS — R079 Chest pain, unspecified: Secondary | ICD-10-CM | POA: Diagnosis not present

## 2021-05-05 DIAGNOSIS — E1140 Type 2 diabetes mellitus with diabetic neuropathy, unspecified: Secondary | ICD-10-CM | POA: Diagnosis not present

## 2021-05-05 DIAGNOSIS — Z20822 Contact with and (suspected) exposure to covid-19: Secondary | ICD-10-CM | POA: Insufficient documentation

## 2021-05-05 DIAGNOSIS — I509 Heart failure, unspecified: Secondary | ICD-10-CM | POA: Diagnosis not present

## 2021-05-05 DIAGNOSIS — G479 Sleep disorder, unspecified: Secondary | ICD-10-CM

## 2021-05-05 DIAGNOSIS — I110 Hypertensive heart disease with heart failure: Secondary | ICD-10-CM | POA: Insufficient documentation

## 2021-05-05 DIAGNOSIS — Z853 Personal history of malignant neoplasm of breast: Secondary | ICD-10-CM | POA: Insufficient documentation

## 2021-05-05 DIAGNOSIS — W19XXXA Unspecified fall, initial encounter: Secondary | ICD-10-CM

## 2021-05-05 DIAGNOSIS — Z7984 Long term (current) use of oral hypoglycemic drugs: Secondary | ICD-10-CM | POA: Insufficient documentation

## 2021-05-05 DIAGNOSIS — W01198A Fall on same level from slipping, tripping and stumbling with subsequent striking against other object, initial encounter: Secondary | ICD-10-CM | POA: Diagnosis not present

## 2021-05-05 DIAGNOSIS — N9489 Other specified conditions associated with female genital organs and menstrual cycle: Secondary | ICD-10-CM | POA: Insufficient documentation

## 2021-05-05 DIAGNOSIS — R5383 Other fatigue: Secondary | ICD-10-CM | POA: Diagnosis not present

## 2021-05-05 DIAGNOSIS — F05 Delirium due to known physiological condition: Secondary | ICD-10-CM | POA: Diagnosis not present

## 2021-05-05 DIAGNOSIS — Z043 Encounter for examination and observation following other accident: Secondary | ICD-10-CM | POA: Diagnosis not present

## 2021-05-05 LAB — CBC
HCT: 42.2 % (ref 36.0–46.0)
Hemoglobin: 14 g/dL (ref 12.0–15.0)
MCH: 32 pg (ref 26.0–34.0)
MCHC: 33.2 g/dL (ref 30.0–36.0)
MCV: 96.6 fL (ref 80.0–100.0)
Platelets: 239 10*3/uL (ref 150–400)
RBC: 4.37 MIL/uL (ref 3.87–5.11)
RDW: 14.3 % (ref 11.5–15.5)
WBC: 13.5 10*3/uL — ABNORMAL HIGH (ref 4.0–10.5)
nRBC: 0 % (ref 0.0–0.2)

## 2021-05-05 LAB — I-STAT VENOUS BLOOD GAS, ED
Acid-Base Excess: 7 mmol/L — ABNORMAL HIGH (ref 0.0–2.0)
Bicarbonate: 33.5 mmol/L — ABNORMAL HIGH (ref 20.0–28.0)
Calcium, Ion: 1.17 mmol/L (ref 1.15–1.40)
HCT: 44 % (ref 36.0–46.0)
Hemoglobin: 15 g/dL (ref 12.0–15.0)
O2 Saturation: 53 %
Patient temperature: 97.7
Potassium: 3.7 mmol/L (ref 3.5–5.1)
Sodium: 141 mmol/L (ref 135–145)
TCO2: 35 mmol/L — ABNORMAL HIGH (ref 22–32)
pCO2, Ven: 53.4 mmHg (ref 44.0–60.0)
pH, Ven: 7.404 (ref 7.250–7.430)
pO2, Ven: 28 mmHg — CL (ref 32.0–45.0)

## 2021-05-05 LAB — URINALYSIS, ROUTINE W REFLEX MICROSCOPIC
Bilirubin Urine: NEGATIVE
Glucose, UA: NEGATIVE mg/dL
Hgb urine dipstick: NEGATIVE
Ketones, ur: NEGATIVE mg/dL
Leukocytes,Ua: NEGATIVE
Nitrite: NEGATIVE
Protein, ur: NEGATIVE mg/dL
Specific Gravity, Urine: 1.007 (ref 1.005–1.030)
pH: 7 (ref 5.0–8.0)

## 2021-05-05 LAB — RESP PANEL BY RT-PCR (FLU A&B, COVID) ARPGX2
Influenza A by PCR: NEGATIVE
Influenza B by PCR: NEGATIVE
SARS Coronavirus 2 by RT PCR: NEGATIVE

## 2021-05-05 LAB — COMPREHENSIVE METABOLIC PANEL
ALT: 37 U/L (ref 0–44)
AST: 41 U/L (ref 15–41)
Albumin: 3.7 g/dL (ref 3.5–5.0)
Alkaline Phosphatase: 75 U/L (ref 38–126)
Anion gap: 7 (ref 5–15)
BUN: 24 mg/dL — ABNORMAL HIGH (ref 8–23)
CO2: 32 mmol/L (ref 22–32)
Calcium: 8.5 mg/dL — ABNORMAL LOW (ref 8.9–10.3)
Chloride: 101 mmol/L (ref 98–111)
Creatinine, Ser: 0.88 mg/dL (ref 0.44–1.00)
GFR, Estimated: >60 mL/min (ref >60)
Glucose, Bld: 149 mg/dL — ABNORMAL HIGH (ref 70–99)
Potassium: 4.1 mmol/L (ref 3.5–5.1)
Sodium: 140 mmol/L (ref 135–145)
Total Bilirubin: 0.9 mg/dL (ref 0.3–1.2)
Total Protein: 6.8 g/dL (ref 6.5–8.1)

## 2021-05-05 LAB — ACETAMINOPHEN LEVEL: Acetaminophen (Tylenol), Serum: <10 ug/mL — ABNORMAL LOW (ref 10–30)

## 2021-05-05 LAB — RAPID URINE DRUG SCREEN, HOSP PERFORMED
Amphetamines: NOT DETECTED
Barbiturates: NOT DETECTED
Benzodiazepines: NOT DETECTED
Cocaine: NOT DETECTED
Opiates: NOT DETECTED
Tetrahydrocannabinol: NOT DETECTED

## 2021-05-05 LAB — TROPONIN I (HIGH SENSITIVITY)
Troponin I (High Sensitivity): 8 ng/L (ref <18)
Troponin I (High Sensitivity): 5 ng/L (ref <18)

## 2021-05-05 LAB — BRAIN NATRIURETIC PEPTIDE: B Natriuretic Peptide: 182.4 pg/mL — ABNORMAL HIGH (ref 0.0–100.0)

## 2021-05-05 LAB — SALICYLATE LEVEL: Salicylate Lvl: <7 mg/dL — ABNORMAL LOW (ref 7.0–30.0)

## 2021-05-05 LAB — AMMONIA: Ammonia: 39 umol/L — ABNORMAL HIGH (ref 9–35)

## 2021-05-05 LAB — OSMOLALITY: Osmolality: 302 mOsm/kg — ABNORMAL HIGH (ref 275–295)

## 2021-05-05 LAB — CBG MONITORING, ED: Glucose-Capillary: 152 mg/dL — ABNORMAL HIGH (ref 70–99)

## 2021-05-05 MED ORDER — SODIUM CHLORIDE 0.9 % IV BOLUS
500.0000 mL | Freq: Once | INTRAVENOUS | Status: AC
  Administered 2021-05-05: 17:00:00 500 mL via INTRAVENOUS

## 2021-05-05 NOTE — ED Notes (Signed)
RT Note: Pt. remains on 3 lpm n/c per her home setting.

## 2021-05-05 NOTE — Discharge Instructions (Signed)
Please wear your oxygen during the daytime. Please follow up with PCP in the next 24-48 hours for repeat assessment.

## 2021-05-05 NOTE — ED Notes (Signed)
Pt friend Gaylord Shih wants to be called at discharge to drive [phone]

## 2021-05-05 NOTE — Progress Notes (Signed)
Subjective:    Patient ID: [name], female    DOB: 11-15-1940, 80 y.o.   MRN: [id number]  Chief Complaint  Patient presents with   Shaking    HPI Patient is in today for complaints of shaking and pain. She is here with her husband. Both are poor historians. They say a friend drove them to appt today.  M-Wed last week had "pain." Went to the "back doctor" on Wednesday and started steroid taper, finishes that up today, per patient.  She says she fell 7-8 times since last week. Unwitnessed (?) Unsure if she hit her head during any of the falls(?).  "Really hurt this foot" on the last fall, pointing to right foot.  Husband says she "can't sleep and she can't stay awake."   Patient states she feels very sleepy today and like she hasn't woken up yet. Feels a little nauseated. Denies any head pain. No blurred vision. Says she feels very foggy. She does have some chest pain today in the center of her chest, but nothing like yesterday "when it felt very tight."   She was able to eat potatoes and ham this morning. Husband says she only slept a few hours last night. He says she goes through cycles like this and doesn't sleep much because of pain in her back.   Patient says she doesn't think she took any of her pills last week.  She has been evaluated by neurology and cardiology in the last few months. She has also been followed by [facility]. It has been suggested numerous times, by myself included, to have additional help at home, but outreach attempts have been unsuccessful.   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 Wynelle Link    Personal history of radiation therapy    RESTLESS LEGS SYNDROME 05/05/2007   Qualifier: Diagnosis of  By: Gwenette Greet MD, Armando Reichert    RLS (restless legs syndrome)    Sleep apnea     Past Surgical History:  Procedure Laterality Date   APPENDECTOMY  age 63   BREAST LUMPECTOMY Right 2012   BREAST SURGERY  2012   rt breast lumpectomy   CATARACT EXTRACTION W/PHACO Left 08/12/2020   Procedure: CATARACT EXTRACTION PHACO AND INTRAOCULAR LENS PLACEMENT LEFT EYE;  Surgeon: Baruch Goldmann, MD;  Location: [facility];  Service: Ophthalmology;  Laterality: Left;  left CDE:13.68   CATARACT EXTRACTION W/PHACO Right 08/26/2020   Procedure: CATARACT EXTRACTION PHACO AND INTRAOCULAR LENS PLACEMENT RIGHT EYE;  Surgeon: Baruch Goldmann, MD;  Location: [facility];  Service: Ophthalmology;  Laterality: Right;  right CDE=12.48   flex signoidoscopy     INCISION AND DRAINAGE PERIRECTAL ABSCESS     JOINT  REPLACEMENT Right 2006   knee   REPLACEMENT TOTAL KNEE Right    TOTAL KNEE ARTHROPLASTY Left 08/06/2014   Procedure: LEFT TOTAL KNEE ARTHROPLASTY;  Surgeon: Gaynelle Arabian, MD;  Location: [facility];  Service: Orthopedics;  Laterality: Left;   TUBAL LIGATION  1979    Family History  Problem Relation Age of Onset   Alcohol abuse Father    Cancer Sister        breast   Heart disease Paternal Grandfather    Diabetes Other    Obesity Other    Heart disease Brother        Valve    Social History   Tobacco Use   Smoking status: Never    Passive exposure: Never   Smokeless tobacco: Never  Vaping Use   Vaping Use: Never used  Substance Use Topics   Alcohol use: No   Drug use: No     Allergies  Allergen Reactions   Sulfa  Antibiotics Swelling     Facial Swelling "swelling lips"    Sulfonamide Derivatives Swelling    Lips Swelling   Other     BLOOD PRODUCT REFUSAL    Review of Systems NEGATIVE UNLESS OTHERWISE INDICATED IN HPI      Objective:     BP (!) 154/96    Pulse 76    Temp 97.7 F (36.5 C)    Ht 5\' 1"  (1.549 m)    Wt 190 lb 4 oz (86.3 kg)    SpO2 92%    BMI 35.95 kg/m   Wt Readings from Last 3 Encounters:  05/05/21 190 lb 4 oz (86.3 kg)  04/18/21 179 lb (81.2 kg)  03/17/21 193 lb 3.2 oz (87.6 kg)    BP Readings from Last 3 Encounters:  05/05/21 (!) 154/96  04/18/21 (!) 142/78  03/17/21 (!) 142/80     Physical Exam Vitals and nursing note reviewed.  Constitutional:      Appearance: Normal appearance. She is obese.     Comments: Uses rollator walker   HENT:     Head: Normocephalic and atraumatic.     Right Ear: External ear normal.     Nose: Nose normal.     Mouth/Throat:     Mouth: Mucous membranes are moist.  Eyes:     Extraocular Movements: Extraocular movements intact.     Conjunctiva/sclera: Conjunctivae normal.     Pupils: Pupils are equal, round, and reactive to light.     Comments: Wears glasses   Cardiovascular:     Rate and Rhythm: Normal rate. Rhythm irregular.  Pulmonary:     Effort: Pulmonary effort is normal.     Breath sounds: Normal breath sounds.  Musculoskeletal:     Cervical back: Normal range of motion.     Right lower leg: Edema present.     Left lower leg: Edema present.  Skin:    Comments: Ecchymosis R foot  Neurological:     General: No focal deficit present.     Mental Status: She is oriented to person, place, and time. Mental status is at baseline. She is lethargic and confused.     GCS: GCS eye subscore is 4. GCS verbal subscore is 5. GCS motor subscore is 6.     Sensory: Sensation is intact.     Motor: Motor function is intact. No weakness, tremor, abnormal muscle tone or pronator drift.     Coordination: Coordination is intact.  Coordination normal.     Gait: Gait is intact.  Gait normal.     Comments: Frequently falls asleep during exam today mid-sentence.   Uses a rolling walker   She is able to tell me it's sometime after Dec 14th, we are in winter season, [place] is president, but she thinks she is currently in the hospital.   Psychiatric:        Mood and Affect: Mood normal.        Behavior: Behavior normal.       Assessment & Plan:   Problem List Items Addressed This Visit   None Visit Diagnoses     Altered mental status, unspecified altered mental status type    -  Primary   Chest pain, unspecified type       Frequent falls       Sleep disturbance       Acute foot pain, right           1. Altered mental status, unspecified altered mental status type 2. Chest pain, unspecified type 3. Frequent falls 4. Sleep disturbance 5. Acute foot pain, right -She is confused from her baseline. She is falling asleep during our exam at least 5 times. I'm very concerned that she needs emergent labs, imaging, cardiac work-up due to her presentation today to r/o any acute pathology, especially with recurrent unwitnessed falls. Discussed this with patient and her husband. They will go to [facility] for work-up.    This note was prepared with assistance of Systems analyst. Occasional wrong-word or sound-a-like substitutions may have occurred due to the inherent limitations of voice recognition software.  Time Spent: 46 minutes of total time was spent on the date of the encounter performing the following actions: chart review prior to seeing the patient, obtaining history, performing a medically necessary exam, counseling on the treatment plan, placing orders, and documenting in our EHR.    Trell Secrist M Maliki Gignac, PA-C

## 2021-05-05 NOTE — ED Triage Notes (Signed)
Patient here POV from PCP for Fall and Possible AMS.  Patient was getting Follow-Up Care for Recent Fall at PCP when Provider noticed Patient was Drowsy and not at Baseline mentally. Sent here for Evaluation.  NAD Noted during Triage. BIB Wheelchair. Patient has No Pain.

## 2021-05-05 NOTE — ED Provider Notes (Signed)
[facility] EMERGENCY DEPT Provider Note   CSN: [id number] Arrival date & time: 05/05/21  1424     History Chief Complaint  Patient presents with   Altered Mental Status    BRYONY KAMAN is a 80 y.o. female.  This is a 80 y.o. female with significant medical history as below, including CHF, frequent falls,  RLS who presents to the ED with complaint of "feeling foggy." Patient also with pain to her right foot. Onset of symptoms around Monday one week ago. Progressively worsening symptoms during that time, mild dyspnea. She wears 3 L  in the daytime and CPAP at night time. Good compliance per the pt. Feels like she is breathing normally, no chest pain, n/v/diarrhea/dysuria, no recent med changes, no recent travel or sick contacts, no fevers. She has frequent falls and injured her right foot around 1 week ago 2/2 fall, she also hit her face 'into a teacup" on Wednesday and injured her nose. No thinners reported. No alleviating or exacerbating factors.    Level 5 caveat, very hard of hearing/ams; spouse at bedside is also very heard of hearing and unable to provide much history.   The history is provided by the patient and the spouse. The history is limited by the condition of the patient. No language interpreter was used.  Altered Mental Status Associated symptoms: no abdominal pain, no fever, no nausea, no palpitations and no vomiting       Past Medical History:  Diagnosis Date   AK (actinic keratosis)    Anxiety    occasional   Breast cancer ([place])    R breast, s/p radiation 34Gy/15f 04/29/10-05/05/10   CTS (carpal tunnel syndrome)    Left   Diabetes ([place]    Diastolic CHF ([place]    Dx w/ hospitalization 06/2015 for respiratory failure   Facial asymmetry, acquired 08/20/2016   From injury   Falls frequently    "can not pick left leg up"   Hearing loss    bilateral hearing aides   History of environmental allergies    History of kidney stones 20 years ago    Hypertension    MDD (major depressive disorder)    No blood products ([place])  09/11/2013   Osteoarthritis    hx shoulder, knee, back, wrist pain; saw Dr. AWynelle Link   Personal history of radiation therapy    RESTLESS LEGS SYNDROME 05/05/2007   Qualifier: Diagnosis of  By: CGwenette GreetMD, KArmando Reichert   RLS (restless legs syndrome)    Sleep apnea     Patient Active Problem List   Diagnosis Date Noted   Persistent atrial fibrillation ([place] 09/26/2020   Educated about COVID-19 virus infection 11/30/2019   Chronic respiratory failure with hypoxia and hypercapnia ([place] 07/11/2019   Acute on chronic diastolic CHF (congestive heart failure) ([place] 07/08/2019   Hypoxia 07/07/2019   Hypokalemia 05/03/2019   Facial asymmetry, acquired 08/20/2016   Fracture of knee prosthesis ([place] 08/01/2016   Obesity 02/20/2016   Chronic pain syndrome 02/19/2016   Mild episode of recurrent major depressive disorder ([place] 08/02/2015   Chronic diastolic congestive heart failure ([place] 07/16/2015   Type 2 diabetes mellitus with diabetic neuropathy ([place] 09/27/2014   Hearing loss - has hearing aides, follow by audiologist 10/11/2013   No blood products ([place])  09/11/2013   Disorder of bone and cartilage 06/10/2010   Breast cancer of upper-outer quadrant of right female breast ([place] 04/04/2010   RESTLESS LEGS SYNDROME 05/05/2007  Obstructive sleep apnea 03/01/2007   Essential hypertension 01/27/2007   Osteoarthritis 01/27/2007    Past Surgical History:  Procedure Laterality Date   APPENDECTOMY  age 74   BREAST LUMPECTOMY Right 2012   BREAST SURGERY  2012   rt breast lumpectomy   CATARACT EXTRACTION W/PHACO Left 08/12/2020   Procedure: CATARACT EXTRACTION PHACO AND INTRAOCULAR LENS PLACEMENT LEFT EYE;  Surgeon: Baruch Goldmann, MD;  Location: [facility];  Service: Ophthalmology;  Laterality: Left;  left CDE:13.68   CATARACT EXTRACTION W/PHACO Right 08/26/2020   Procedure: CATARACT EXTRACTION PHACO  AND INTRAOCULAR LENS PLACEMENT RIGHT EYE;  Surgeon: Baruch Goldmann, MD;  Location: [facility];  Service: Ophthalmology;  Laterality: Right;  right CDE=12.48   flex signoidoscopy     INCISION AND DRAINAGE PERIRECTAL ABSCESS     JOINT REPLACEMENT Right 2006   knee   REPLACEMENT TOTAL KNEE Right    TOTAL KNEE ARTHROPLASTY Left 08/06/2014   Procedure: LEFT TOTAL KNEE ARTHROPLASTY;  Surgeon: Gaynelle Arabian, MD;  Location: [facility];  Service: Orthopedics;  Laterality: Left;   TUBAL LIGATION  1979     OB History   No obstetric history on file.     Family History  Problem Relation Age of Onset   Alcohol abuse Father    Cancer Sister        breast   Heart disease Paternal Grandfather    Diabetes Other    Obesity Other    Heart disease Brother        Valve    Social History   Tobacco Use   Smoking status: Never    Passive exposure: Never   Smokeless tobacco: Never  Vaping Use   Vaping Use: Never used  Substance Use Topics   Alcohol use: No   Drug use: No    Home Medications Prior to Admission medications   Medication Sig Start Date End Date Taking? Authorizing Provider  aspirin EC 325 MG tablet Take 1 tablet (325 mg total) by mouth daily. 04/18/21   Lendon Colonel, NP  blood glucose meter kit and supplies Dispense based on patient and insurance preference. Use up to two  times daily as directed. (FOR ICD-10 E10.9, E11.9). 01/13/21   Allwardt, Alyssa M, PA-C  carvedilol (COREG) 3.125 MG tablet TAKE 1 TABLET TWICE DAILY WITH A MEAL 03/31/21   Allwardt, Randa Evens, PA-C  Cholecalciferol (VITAMIN D3) 50 MCG (2000 UT) CHEW Chew 4,000 Units by mouth daily.    [provider]  Cyanocobalamin (B-12 PO) Take 2 tablets by mouth daily.    [provider]  furosemide (LASIX) 80 MG tablet Take 1 tablet (80 mg total) by mouth 3 (three) times daily. 02/10/21 05/11/21  Allwardt, Randa Evens, PA-C  HYDROcodone-acetaminophen (NORCO/VICODIN) 5-325 MG tablet Take 1 tablet by mouth 2 (two)  times daily as needed for moderate pain.    [provider]  insulin glargine (LANTUS SOLOSTAR) 100 UNIT/ML Solostar Pen Inject 25 Units into the skin at bedtime. 02/10/21   Allwardt, Randa Evens, PA-C  Insulin Pen Needle (DROPLET PEN NEEDLES) 32G X 4 MM MISC USE AS DIRECTED ONE TIME DAILY 01/13/21   Allwardt, Alyssa M, PA-C  lisinopril (ZESTRIL) 20 MG tablet Take 1 tablet (20 mg total) by mouth daily. 02/10/21   Allwardt, Randa Evens, PA-C  metFORMIN (GLUCOPHAGE) 1000 MG tablet Take 1 tablet (1,000 mg total) by mouth in the morning and at bedtime. 02/10/21 05/11/21  Allwardt, Randa Evens, PA-C  potassium chloride SA (KLOR-CON) 20 MEQ tablet  Take 1 tablet (20 mEq total) by mouth 3 (three) times daily. 03/11/21 05/05/21  Allwardt, Randa Evens, PA-C  pramipexole (MIRAPEX) 1 MG tablet TAKE 1 TABLET TWICE DAILY 03/31/21   Allwardt, Randa Evens, PA-C  triamcinolone cream (KENALOG) 0.1 % Apply 1 application topically 2 (two) times daily as needed (redness). Apply to red areas on legs 12/04/20   Allwardt, Alyssa M, PA-C    Allergies    Sulfa antibiotics, Sulfonamide derivatives, and Other  Review of Systems   Review of Systems  Unable to perform ROS: Mental status change  Constitutional:  Positive for activity change, appetite change and fatigue. Negative for chills and fever.       Lethargy  Respiratory:  Positive for shortness of breath. Negative for cough.   Cardiovascular:  Negative for chest pain and palpitations.  Gastrointestinal:  Negative for abdominal pain, blood in stool, nausea and vomiting.  Genitourinary:  Negative for difficulty urinating, dysuria and pelvic pain.  Musculoskeletal:  Positive for arthralgias and gait problem.       R foot pain  Neurological:  Negative for syncope.   Physical Exam Updated Vital Signs BP (!) 160/113 (BP Location: Right Arm)    Pulse 92    Temp 97.7 F (36.5 C) (Oral)    Resp 19    Ht '5\' 1"'  (1.549 m)    Wt 86.3 kg    SpO2 96%    BMI 35.95 kg/m   Physical  Exam Vitals and nursing note reviewed. Exam conducted with a chaperone present.  Constitutional:      General: She is not in acute distress.    Appearance: Normal appearance.  HENT:     Head: Normocephalic.     Comments: Abrasion to bridge of nose    Right Ear: External ear normal.     Left Ear: External ear normal.     Nose: Nose normal.     Mouth/Throat:     Mouth: Mucous membranes are moist.  Eyes:     General: No scleral icterus.       Right eye: No discharge.        Left eye: No discharge.     Extraocular Movements: Extraocular movements intact.     Pupils: Pupils are equal, round, and reactive to light.  Cardiovascular:     Rate and Rhythm: Normal rate and regular rhythm.     Pulses: Normal pulses.     Heart sounds: Normal heart sounds.  Pulmonary:     Effort: Pulmonary effort is normal. No respiratory distress.     Breath sounds: Normal breath sounds.  Abdominal:     General: Abdomen is flat.     Tenderness: There is no abdominal tenderness.  Musculoskeletal:        General: Normal range of motion.     Cervical back: Normal range of motion.     Right lower leg: No edema.     Left lower leg: No edema.  Feet:     Comments: Mild tenderness to the right foot laterally.  Diffuse bruising to the right foot. Skin:    General: Skin is warm and dry.     Capillary Refill: Capillary refill takes less than 2 seconds.  Neurological:     Mental Status: She is alert and oriented to person, place, and time.     GCS: GCS eye subscore is 4. GCS verbal subscore is 5. GCS motor subscore is 6.     Cranial Nerves: Cranial nerves 2-12 are intact.  Sensory: Sensation is intact.     Motor: Motor function is intact. No weakness, tremor or pronator drift.     Coordination: Coordination is intact.     Comments: Slowed speech, pt appears to be falling asleep during examination. She will wake up with noxious stimulus applied. Neuro exam appears non-focal   Psychiatric:        Mood and  Affect: Mood normal.        Behavior: Behavior normal.    ED Results / Procedures / Treatments   Labs (all labs ordered are listed, but only abnormal results are displayed) Labs Reviewed  COMPREHENSIVE METABOLIC PANEL - Abnormal; Notable for the following components:      Result Value   Glucose, Bld 149 (*)    BUN 24 (*)    Calcium 8.5 (*)    All other components within normal limits  CBC - Abnormal; Notable for the following components:   WBC 13.5 (*)    All other components within normal limits  OSMOLALITY - Abnormal; Notable for the following components:   Osmolality 302 (*)    All other components within normal limits  BRAIN NATRIURETIC PEPTIDE - Abnormal; Notable for the following components:   B Natriuretic Peptide 182.4 (*)    All other components within normal limits  URINALYSIS, ROUTINE W REFLEX MICROSCOPIC - Abnormal; Notable for the following components:   Color, Urine COLORLESS (*)    All other components within normal limits  SALICYLATE LEVEL - Abnormal; Notable for the following components:   Salicylate Lvl <1.9 (*)    All other components within normal limits  ACETAMINOPHEN LEVEL - Abnormal; Notable for the following components:   Acetaminophen (Tylenol), Serum <10 (*)    All other components within normal limits  AMMONIA - Abnormal; Notable for the following components:   Ammonia 39 (*)    All other components within normal limits  CBG MONITORING, ED - Abnormal; Notable for the following components:   Glucose-Capillary 152 (*)    All other components within normal limits  I-STAT VENOUS BLOOD GAS, ED - Abnormal; Notable for the following components:   pO2, Ven 28.0 (*)    Bicarbonate 33.5 (*)    TCO2 35 (*)    Acid-Base Excess 7.0 (*)    All other components within normal limits  RESP PANEL BY RT-PCR (FLU A&B, COVID) ARPGX2  RAPID URINE DRUG SCREEN, HOSP PERFORMED  TROPONIN I (HIGH SENSITIVITY)  TROPONIN I (HIGH SENSITIVITY)    EKG EKG  Interpretation  Date/Time:  Monday May 05 2021 16:23:00 EST Ventricular Rate:  81 PR Interval:    QRS Duration: 90 QT Interval:  542 QTC Calculation: 630 R Axis:   -35 Text Interpretation: Atrial fibrillation Ventricular premature complex Inferior infarct, old Anteroseptal infarct, age indeterminate Interpretation limited secondary to artifact no stemi Confirmed by Wynona Dove ([id number]) on 05/06/2021 12:30:28 AM  Radiology DG Chest 1 View  Result Date: 05/05/2021 CLINICAL DATA:  pain/brusing to 5th metatarsal EXAM: CHEST  1 VIEW.patient rotation COMPARISON:  Chest x-ray 03/17/2021. CT angiography chest 07/07/2019 FINDINGS: Markedly limited evaluation of the cardiomediastinal silhouette with prominent ascending aorta likely due to patient rotation and chronic marked elevation of left hemidiaphragm. The heart and mediastinal contours are within normal limits. Aortic calcification. No focal consolidation. No pulmonary edema. No pleural effusion. No pneumothorax. No acute osseous abnormality. IMPRESSION: 1. No active disease with limited evaluation due to patient rotation and chronic marked elevation of left hemidiaphragm. Prominent ascending aorta likely due to  patient positioning. Recommend repeat PA and lateral view. 2.  Aortic Atherosclerosis (ICD10-I70.0). Electronically Signed   By: Iven Finn M.D.   On: 05/05/2021 15:52   DG Chest 2 View  Result Date: 05/05/2021 CLINICAL DATA:  Prominent ascending aorta.  Recent fall. EXAM: CHEST - 2 VIEW COMPARISON:  05/05/2021 at 1540 hours and 07/07/2019 and 03/17/2021 FINDINGS: Two views of the chest were obtained. Heart size is upper limits of normal but appears stable. Limited evaluation of the ascending thoracic aorta on the lateral view because the arms are not elevated but the overall size and configuration is similar to exam on 03/17/2021. Atherosclerotic calcifications at the aortic arch. Chronic elevation of left hemidiaphragm. No large  pleural effusions. Linear densities in the left upper chest appear to be chronic and compatible with scarring. IMPRESSION: 1. No acute chest abnormality. Stable elevation of the left hemidiaphragm with left lung scarring. 2. Cardiomediastinal silhouette is slightly prominent and probably accentuated by patient positioning. Electronically Signed   By: Markus Daft M.D.   On: 05/05/2021 17:17   CT Head Wo Contrast  Result Date: 05/05/2021 CLINICAL DATA:  Delirium EXAM: CT HEAD WITHOUT CONTRAST TECHNIQUE: Contiguous axial images were obtained from the base of the skull through the vertex without intravenous contrast. COMPARISON:  CT head 02/10/2021 FINDINGS: Brain: No acute intracranial hemorrhage, mass effect, or herniation. No extra-axial fluid collections. No evidence of acute territorial infarct. No hydrocephalus. Mild cortical volume loss. Extensive hypodensities throughout the periventricular and subcortical white matter, likely secondary to chronic microvascular ischemic changes. Vascular: Calcified plaques in the carotid siphons. Skull: Normal. Negative for fracture or focal lesion. Sinuses/Orbits: No acute finding. Other: None. IMPRESSION: Chronic changes with no acute intracranial process identified. Electronically Signed   By: Ofilia Neas M.D.   On: 05/05/2021 16:12   DG Foot Complete Right  Result Date: 05/05/2021 CLINICAL DATA:  Pain and bruising fifth metatarsal. EXAM: RIGHT FOOT COMPLETE - 3+ VIEW COMPARISON:  None. FINDINGS: The bones are diffusely osteopenic. There is no evidence for acute fracture or dislocation. There is an acute oblique fracture through the mid second metatarsal. Fractures offset 2 mm. There is no significant angulation. There is no dislocation. There is a soft tissue calcification in the region of the third metatarsal measuring 1.4 x 1.9 cm, indeterminate. This is well-circumscribed. There are vascular calcifications in the soft tissues. IMPRESSION: 1. Acute fracture  second metatarsal. 2. Diffuse osteopenia. 3. Soft tissue calcification in the midfoot may be related to old injury. Electronically Signed   By: Ronney Asters M.D.   On: 05/05/2021 15:51    Procedures Procedures   Medications Ordered in ED Medications  sodium chloride 0.9 % bolus 500 mL (500 mLs Intravenous New Bag/Given 05/05/21 1636)    ED Course  I have reviewed the triage vital signs and the nursing notes.  Pertinent labs & imaging results that were available during my care of the patient were reviewed by me and considered in my medical decision making (see chart for details).    MDM Rules/Calculators/A&P                          CC: AMS  This patient complains of above; this involves an extensive number of treatment options and is a complaint that carries with it a high risk of complications and morbidity. Vital signs were reviewed. Serious etiologies considered.  Record review:  Previous records obtained and reviewed   Work up as above,  notable for:  Labs & imaging results that were available during my care of the patient were reviewed by me and considered in my medical decision making.   I ordered imaging studies which included CXR, CTH,  Foot xr and I independently visualized and interpreted imaging which showed CXR and CTH non-acute. Foot XR with fx through the 2nd metatarsal.   Ambulatory referral to Ortho surgery and sent for metatarsal fracture.  Postop shoe.  Lower extremities neurovascular intact bilateral.  There is some bruising to the right foot.  Management: Pt was placed on her home nasal cannula 3 L and reports that she is feeling significantly better.  She reports that she is no longer feeling "foggy."  Patient's blood gas was stable.  Her pulse ox on room air was around 90%.  Improved to 97% -99%  on her home oxygen.   Patient's labs otherwise look patient's labs otherwise are reassuring  See discussion with family at bedside regarding patient's  presentation today.  Advised her to wear her home oxygen as recommended.  Recommend follow-up with PCP.  Strict return precautions were discussed.  Pt is ambulatory with a steady gait, she does not desaturate with ambulation when using her walker which is her home walking mechanism.  Patient reports feeling significantly better on nasal cannula which is her home regimen.   The patient improved significantly and was discharged in stable condition. Detailed discussions were had with the patient regarding current findings, and need for close f/u with PCP or on call doctor. The patient has been instructed to return immediately if the symptoms worsen in any way for re-evaluation. Patient verbalized understanding and is in agreement with current care plan. All questions answered prior to discharge.        This chart was dictated using voice recognition software.  Despite best efforts to proofread,  errors can occur which can change the documentation meaning.    Final Clinical Impression(s) / ED Diagnoses Final diagnoses:  Other fatigue  Injury of right foot, initial encounter  Closed nondisplaced fracture of second metatarsal bone of right foot, initial encounter    Rx / DC Orders ED Discharge Orders     None        Jeanell Sparrow, DO 05/06/21 1523

## 2021-05-05 NOTE — Telephone Encounter (Signed)
New, Ila Mcgill, RN; Colon Branch, Brandon; Stenson, Melissa Hello [place],   Here is the reply I received from our vent team:   "Based on SO notes PAR has been requested. I have inlculded person working on it  12 09 2022 -  **Created new PAR request to [facility] using [employer] portal, then upload it in [place] for reference.  PEND AUTH # : [id number]. From 08 23 2022 to 08 23 2023. Setting ff up 3 biz days. -GL "   It looks like they have what is needed and they are re-requesting auth at this time.   Thank you,   Demetrius Charity

## 2021-05-05 NOTE — ED Notes (Signed)
When changing brief and hooking up purwick noticed bruise aprox 8 inches long X 2 inches wide on left hip and buttock.

## 2021-05-06 ENCOUNTER — Telehealth (HOSPITAL_BASED_OUTPATIENT_CLINIC_OR_DEPARTMENT_OTHER): Payer: Self-pay | Admitting: Emergency Medicine

## 2021-05-06 ENCOUNTER — Telehealth: Payer: Self-pay | Admitting: *Deleted

## 2021-05-06 NOTE — Telephone Encounter (Signed)
ONO results per Rexene Edison NP: 6.5hr>90% 14 minutes<88% on Trilogy with oxygen No changes

## 2021-05-06 NOTE — Telephone Encounter (Signed)
Ambulatory referral to ortho surgery sent

## 2021-05-12 ENCOUNTER — Other Ambulatory Visit: Payer: Self-pay | Admitting: Physician Assistant

## 2021-05-13 ENCOUNTER — Telehealth: Payer: Self-pay | Admitting: Pharmacist

## 2021-05-13 NOTE — Progress Notes (Addendum)
Chronic Care Management Pharmacy Assistant   Name: [name]  MRN: [id number] DOB: 02-17-1941   Reason for Encounter: Monthly Medication Coordination Call     Medications: Outpatient Encounter Medications as of 05/13/2021  Medication Sig   aspirin EC 325 MG tablet Take 1 tablet (325 mg total) by mouth daily.   blood glucose meter kit and supplies Dispense based on patient and insurance preference. Use up to two  times daily as directed. (FOR ICD-10 E10.9, E11.9).   carvedilol (COREG) 3.125 MG tablet TAKE 1 TABLET TWICE DAILY WITH A MEAL   Cholecalciferol (VITAMIN D3) 50 MCG (2000 UT) CHEW Chew 4,000 Units by mouth daily.   Cyanocobalamin (B-12 PO) Take 2 tablets by mouth daily.   furosemide (LASIX) 80 MG tablet Take 1 tablet (80 mg total) by mouth 3 (three) times daily.   HYDROcodone-acetaminophen (NORCO/VICODIN) 5-325 MG tablet Take 1 tablet by mouth 2 (two) times daily as needed for moderate pain.   insulin glargine (LANTUS SOLOSTAR) 100 UNIT/ML Solostar Pen Inject 25 Units into the skin at bedtime.   Insulin Pen Needle (DROPLET PEN NEEDLES) 32G X 4 MM MISC USE AS DIRECTED ONE TIME DAILY   lisinopril (ZESTRIL) 20 MG tablet Take 1 tablet (20 mg total) by mouth daily.   metFORMIN (GLUCOPHAGE) 1000 MG tablet Take 1 tablet (1,000 mg total) by mouth in the morning and at bedtime.   potassium chloride SA (KLOR-CON) 20 MEQ tablet Take 1 tablet (20 mEq total) by mouth 3 (three) times daily.   pramipexole (MIRAPEX) 1 MG tablet TAKE 1 TABLET TWICE DAILY   triamcinolone cream (KENALOG) 0.1 % Apply 1 application topically 2 (two) times daily as needed (redness). Apply to red areas on legs   No facility-administered encounter medications on file as of 05/13/2021.    Reviewed chart for medication changes ahead of medication coordination call.  No OVs, Consults, or hospital visits since last care coordination call/Pharmacist visit. (If appropriate, list visit date, provider name)  No  medication changes indicated OR if recent visit, treatment plan here.  BP Readings from Last 3 Encounters:  05/05/21 (!) 160/113  05/05/21 (!) 154/96  04/18/21 (!) 142/78    Lab Results  Component Value Date   HGBA1C 7.4 (H) 02/10/2021     Patient obtains medications through Adherence Packaging  30 Days   Last adherence delivery included: (medication name and frequency)  Furosemide 80 mg one at breakfast one at evening meal Carvedilol 3.125 mg one at breakfast one at evening meal Metformin 1000 mg one at breakfast one at evening meal Potassium 20 meq one at breakfast one at evening meal Pramipexole 1 mg one at breakfast one at evening meal Lisinopril 20 mg one at breakfast one at evening meal Lantus Pen 20 units at bedtime   Patient is due for next adherence delivery on: 05/22/2021. Called patient and reviewed medications and coordinated delivery.   Patient declined Potassium 20 meq one at breakfast one at evening meal Pramipexole 1 mg one at breakfast one at evening meal (#180 was filled 03/31/2021 [facility] mail order) Lisinopril 20 mg one at breakfast one at evening meal (#90 was filled by [facility] Mail order on 04/21/21) Carvedilol 3.125 mg one at breakfast one at evening meal (#180 filled 03/31/21 [facility] mail order)   This delivery to include:  Furosemide 80 mg one at breakfast one at evening meal Metformin 1000 mg one at breakfast one at evening meal Aspirin 325 mg 1 tablet daily  Lantus Pen  20 units at bedtime (pharmacy to call patient and advise on copay prior to filling so patient can check with mail order on cost to see which is most effective)   Patient needs refills for: Aspirin 523 mg 1 tablet daily for 30 day supply.   Confirmed delivery date of 05/22/2021, advised patient that pharmacy will contact them the morning of delivery.   Care Gaps   AWV: overdue  Colonoscopy: unknown  DM Eye Exam: overdue 12/20/19 DM Foot Exam: overdue 01/24/20 Microalbumin: overdue   HbgAIC: done 02/10/21 (7.4) DEXA: done 12/07/19 Mammogram: done 03/25/21   Star Rating Drugs: metFORMIN (GLUCOPHAGE) 1000 MG tablet - last filled 04/16/21 30 days ([facility]) lisinopril (ZESTRIL) 20 MG tablet - last filled 04/16/21 # [place] and again on 04/21/21 90 days ([place])  Future Appointments  Date Time Provider [place]  05/15/2021  2:45 PM Newt Minion, MD [facility] None  05/27/2021  1:30 PM [facility] CCM PHARMACIST [facility]  05/30/2021  2:20 PM Lendon Colonel, NP [facility]  07/22/2021  2:20 PM Minus Breeding, MD [facility]  10/13/2021  8:45 AM [facility] HEALTH COACH [facility] [place], [place] Pharmacist Assistant  [phone]  10 minutes spent in review, coordination, and documentation.  Reviewed by: Beverly Milch, PharmD Clinical Pharmacist [phone]

## 2021-05-15 ENCOUNTER — Ambulatory Visit: Payer: Medicare HMO | Admitting: Orthopedic Surgery

## 2021-05-16 ENCOUNTER — Other Ambulatory Visit: Payer: Self-pay | Admitting: Adult Health

## 2021-05-16 DIAGNOSIS — M5136 Other intervertebral disc degeneration, lumbar region: Secondary | ICD-10-CM | POA: Diagnosis not present

## 2021-05-21 NOTE — Progress Notes (Signed)
Chronic Care Management Pharmacy Note  05/27/2021 Name:  [name] MRN:  [id number] DOB:  1940/12/27  Recommendations/changes: Getting patient set up with pill packaging [facility].  She will get everything but her Lantus through them because mail order is her preferred and her Lantus price will be the best through them  Subjective: [name] is an 81 y.o. year old female who is a primary patient of Allwardt, Alyssa M, PA-C.  The CCM team was consulted for assistance with disease management and care coordination needs.    Engaged with patient by telephone for follow up visit in response to provider referral for pharmacy case management and/or care coordination services.   Consent to Services:  The patient was given information about Chronic Care Management services, agreed to services, and gave verbal consent prior to initiation of services.  Please see initial visit note for detailed documentation.   Patient Care Team: Allwardt, Randa Evens, PA-C as PCP - General (Physician Assistant) Minus Breeding, MD as PCP - Cardiology (Cardiology) Suella Broad, MD as Consulting Physician (Physical Medicine and Rehabilitation) Minus Breeding, MD as Consulting Physician (Cardiology) Madelin Headings, DO (Optometry) Leona Singleton, RN as Case Manager Edythe Clarity, [facility] (Pharmacist) Cameron Sprang, MD as Consulting Physician (Neurology)  Objective:  Lab Results  Component Value Date   CREATININE 0.88 05/05/2021   CREATININE 0.94 04/18/2021   CREATININE 0.81 02/10/2021    Lab Results  Component Value Date   HGBA1C 7.4 (H) 02/10/2021   Last diabetic Eye exam:  Lab Results  Component Value Date/Time   HMDIABEYEEXA No Retinopathy 12/20/2018 12:00 AM    Last diabetic Foot exam:  Lab Results  Component Value Date/Time   HMDIABFOOTEX done 02/14/2010 12:00 AM        Component Value Date/Time   CHOL 101 06/11/2020 1032   TRIG 95.0 06/11/2020 1032   HDL  31.40 (L) 06/11/2020 1032   CHOLHDL 3 06/11/2020 1032   VLDL 19.0 06/11/2020 1032   LDLCALC 51 06/11/2020 1032   LDLDIRECT 111.6 02/14/2010 1406    Hepatic Function Latest Ref Rng & Units 05/05/2021 02/10/2021 06/11/2020  Total Protein 6.5 - 8.1 g/dL 6.8 6.9 7.0  Albumin 3.5 - 5.0 g/dL 3.7 3.8 3.7  AST 15 - 41 U/L 41 16 18  ALT 0 - 44 U/L 37 12 13  Alk Phosphatase 38 - 126 U/L 75 100 102  Total Bilirubin 0.3 - 1.2 mg/dL 0.9 0.5 0.6  Bilirubin, Direct 0.1 - 0.5 mg/dL - - -    Lab Results  Component Value Date/Time   TSH 1.69 02/10/2021 02:52 PM   TSH 0.902 07/08/2019 01:02 PM   TSH 1.993 08/01/2016 02:48 PM   TSH 0.74 10/16/2011 01:48 PM    CBC Latest Ref Rng & Units 05/05/2021 05/05/2021 04/18/2021  WBC 4.0 - 10.5 K/uL - 13.5(H) 11.0(H)  Hemoglobin 12.0 - 15.0 g/dL 15.0 14.0 13.7  Hematocrit 36.0 - 46.0 % 44.0 42.2 40.5  Platelets 150 - 400 K/uL - 239 233    Lab Results  Component Value Date/Time   VD25OH 32.73 01/03/2020 11:44 AM   VD25OH 22.48 (L) 12/18/2019 11:02 AM    Clinical ASCVD:  The ASCVD Risk score (Arnett DK, et al., 2019) failed to calculate for the following reasons:   The 2019 ASCVD risk score is only valid for ages 54 to 73     Social History   Tobacco Use  Smoking Status Never   Passive exposure:  Never  Smokeless Tobacco Never   BP Readings from Last 3 Encounters:  05/05/21 (!) 160/113  05/05/21 (!) 154/96  04/18/21 (!) 142/78   Pulse Readings from Last 3 Encounters:  05/05/21 92  05/05/21 76  04/18/21 97   Wt Readings from Last 3 Encounters:  05/05/21 190 lb 4.1 oz (86.3 kg)  05/05/21 190 lb 4 oz (86.3 kg)  04/18/21 179 lb (81.2 kg)    Assessment: Review of patient past medical history, allergies, medications, health status, including review of consultants reports, laboratory and other test data, was performed as part of comprehensive evaluation and provision of chronic care management services.   SDOH:  (Social Determinants of  Health) assessments and interventions performed:    CCM Care Plan  Allergies  Allergen Reactions   Sulfa Antibiotics Swelling     Facial Swelling "swelling lips"    Sulfonamide Derivatives Swelling    Lips Swelling   Other     BLOOD PRODUCT REFUSAL    Medications Reviewed Today     Reviewed by Edythe Clarity, [facility] (Pharmacist) on 05/27/21 at 1354  Med List Status: <None>   Medication Order Taking? Sig Documenting Provider Last Dose Status Informant  aspirin 325 MG EC tablet [id number] Yes TAKE ONE TABLET BY MOUTH EVERY MORNING Lendon Colonel, NP Taking Active   blood glucose meter kit and supplies [id number] Yes Dispense based on patient and insurance preference. Use up to two  times daily as directed. (FOR ICD-10 E10.9, E11.9). Allwardt, Randa Evens, PA-C Taking Active   carvedilol (COREG) 3.125 MG tablet [id number] Yes TAKE 1 TABLET TWICE DAILY WITH A MEAL Allwardt, Alyssa M, PA-C Taking Active   Cholecalciferol (VITAMIN D3) 50 MCG (2000 UT) CHEW [id number] Yes Chew 4,000 Units by mouth daily. [provider] Taking Active   Cyanocobalamin (B-12 PO) [id number] Yes Take 2 tablets by mouth daily. [provider] Taking Active Self  furosemide (LASIX) 80 MG tablet [id number]  Take 1 tablet (80 mg total) by mouth 3 (three) times daily. Allwardt, Randa Evens, PA-C  Expired 05/11/21 2359            Med Note Jacki Cones, WHITNEY H   Fri Apr 18, 2021  1:32 PM)    HYDROcodone-acetaminophen (NORCO/VICODIN) 5-325 MG tablet [id number] Yes Take 1 tablet by mouth 2 (two) times daily as needed for moderate pain. [provider] Taking Active Self  insulin glargine (LANTUS SOLOSTAR) 100 UNIT/ML Solostar Pen [id number] Yes Inject 25 Units into the skin at bedtime. Allwardt, Randa Evens, PA-C Taking Active   Insulin Pen Needle (DROPLET PEN NEEDLES) 32G X 4 MM MISC [id number] Yes USE AS DIRECTED ONE TIME DAILY Allwardt, Alyssa M, PA-C Taking Active   lisinopril (ZESTRIL) 20 MG tablet  [id number] Yes Take 1 tablet (20 mg total) by mouth daily. Allwardt, Randa Evens, PA-C Taking Active   metFORMIN (GLUCOPHAGE) 1000 MG tablet [id number]  Take 1 tablet (1,000 mg total) by mouth in the morning and at bedtime. Allwardt, Alyssa M, PA-C  Expired 05/11/21 2359   potassium chloride (KLOR-CON) 10 MEQ tablet [id number] Yes TAKE 2 TABLETS THREE TIMES DAILY. TAKE WITH LASIX. Allwardt, Randa Evens, PA-C Taking Active   potassium chloride SA (KLOR-CON) 20 MEQ tablet [id number]  Take 1 tablet (20 mEq total) by mouth 3 (three) times daily. Allwardt, Alyssa M, PA-C  Expired 05/05/21 2359   pramipexole (MIRAPEX) 1 MG tablet [id number] Yes TAKE 1 TABLET TWICE DAILY Allwardt, Alyssa M, PA-C Taking Active   triamcinolone cream (KENALOG)  0.1 % [id number] Yes Apply 1 application topically 2 (two) times daily as needed (redness). Apply to red areas on legs Allwardt, Randa Evens, PA-C Taking Active             Patient Active Problem List   Diagnosis Date Noted   Persistent atrial fibrillation ([place]) 09/26/2020   Educated about COVID-19 virus infection 11/30/2019   Chronic respiratory failure with hypoxia and hypercapnia (HCC) 07/11/2019   Acute on chronic diastolic CHF (congestive heart failure) ([place]) 07/08/2019   Hypoxia 07/07/2019   Hypokalemia 05/03/2019   Facial asymmetry, acquired 08/20/2016   Fracture of knee prosthesis ([place]) 08/01/2016   Obesity 02/20/2016   Chronic pain syndrome 02/19/2016   Mild episode of recurrent major depressive disorder ([place]) 08/02/2015   Chronic diastolic congestive heart failure ([place]) 07/16/2015   Type 2 diabetes mellitus with diabetic neuropathy ([place]) 09/27/2014   Hearing loss - has hearing aides, follow by audiologist 10/11/2013   No blood products ([place])  09/11/2013   Disorder of bone and cartilage 06/10/2010   Breast cancer of upper-outer quadrant of right female breast ([place]) 04/04/2010   RESTLESS LEGS SYNDROME 05/05/2007   Obstructive sleep apnea 03/01/2007    Essential hypertension 01/27/2007   Osteoarthritis 01/27/2007    Immunization History  Administered Date(s) Administered   Fluad Quad(high Dose 65+) 01/24/2019, 03/17/2021   Influenza Split 02/20/2011, 01/25/2012   Influenza Whole 02/16/1999, 02/25/2007, 03/07/2009, 02/14/2010   Influenza, High Dose Seasonal PF 02/17/2016, 03/23/2017, 01/20/2018   Influenza,inj,Quad PF,6+ Mos 02/01/2013, 02/19/2014, 01/30/2015   Influenza,inj,quad, With Preservative 02/15/2017   PFIZER Comirnaty(Gray Top)Covid-19 Tri-Sucrose Vaccine 07/01/2020   PFIZER(Purple Top)SARS-COV-2 Vaccination 06/06/2019, 06/26/2019   Pneumococcal Conjugate-13 01/30/2015   Pneumococcal Polysaccharide-23 10/11/2013   Td 05/18/2005   Tdap 10/21/2015   Zoster, Live 02/20/2011    Conditions to be addressed/monitored: Afib, OSA, CHF, T2DM  Care Plan : General Pharmacy (Adult)  Updates made by Edythe Clarity, RPH since 05/27/2021 12:00 AM     Problem: HTN, DM   Priority: High  Onset Date: 05/27/2021     Long-Range Goal: Patient-Specific Goal   Start Date: 11/13/2020  Expected End Date: 11/13/2021  Recent Progress: On track  Priority: High  Note:   Current Barriers:  Unable to self administer medications as prescribed  Pharmacist Clinical Goal(s):  Patient will contact provider office for questions/concerns as evidenced notation of same in electronic health record through collaboration with PharmD and provider.   Interventions: 1:1 collaboration with Allwardt, Randa Evens, PA-C regarding development and update of comprehensive plan of care as evidenced by provider attestation and co-signature Inter-disciplinary care team collaboration (see longitudinal plan of care) Comprehensive medication review performed; medication list updated in electronic medical record  Hypertension (BP goal <130/80) -Controlled -Current treatment: Carvedilol 3.125 mg twice daily Appropriate, Effective, Safe, Accessible  Lisinopril 20  mg once daily  Appropriate, Effective, Safe, Accessible  Furosemide 80 mg three times daily   Appropriate, Effective, Safe, Accessible  -Current home readings: none provided, does have cuff and will provide/ask for asssitance using when next seen  -Denies hypotensive/hypertensive symptoms -Educated on Symptoms of hypotension and importance of maintaining adequate hydration; -Counseled to monitor BP at home 1-2x/week in addition to daily weights, document, and provide log at future appointments -Recommended to continue current medication  Update 01/21/21 Reports BP around 130/80. Denies any recent dizziness or HA's. Stated she just took a home COVID-19 test and it was positive. Continue to monitor for now.  Update 05/27/21 Not checking BP at  home lately No dizziness or HA BP in hospital was elevated, patient reports feeling more like herself now. She is living at home alone, encouraged her to reach out to me directly should she have any concerns or need immediate questions answered.   Diabetes (A1c goal <7%) -Controlled -Has not located blood glucose meter, is open to receiving meter now. -Current medications: Metformin 1000 mg twice daily Appropriate, Effective, Safe, Accessible  Lantus 25 units into the skin at bedtime Appropriate, Effective, Safe, Accessible  -Current home glucose readings fasting glucose: is not testing -Denies hypoglycemic/hyperglycemic symptoms -Current exercise: no formal exercise -Educated on A1c and blood sugar goals; Prevention and management of hypoglycemic episodes; Benefits of routine self-monitoring of blood sugar; -Recommended to continue current medication Will order new BG monitor after review  Update 01/21/21 Fasting this morning @ 5am - 110 Highest reported sugar around 150 She has gotten new meter and has just started checking her sugars at home Denies hypoglycemia Continue current meds for now.  Pill packs to help with adherence  Update  05/27/20 Patient has not checked her sugar at home recently, have asked she do this a few times per week at least and let me know if fasting is consistently > 130 or post prandial is > 200. She reports not feeling well last month but is more back to her normal self lately. Denies any glucose < 80. She is now set up in [facility] pill packs, however her mail order keeps sending her prescriptions which is messing up the synchronization. Daughter has called to inform mail order she no longer needs. Continue current meds - due for updated A1c  Patient Goals/Self-Care Activities Patient will:  - focus on medication adherence by using pill packaging.  Setting up with [facility] pill packs for adherence and ease of medication administration.  Follow Up Plan: CMA fu 30 days on adherence         Patient's preferred pharmacy is:  [facility] [place], [place] [address] Phone: [phone] Fax: [phone]  [facility] - [place], [place] - [address] [place] [address] Phone: [phone] Fax: [phone]  [place], [place] [address] Phone: [phone] Fax: [phone]   Verbal consent obtained for [facility] enhanced pharmacy services (medication synchronization, adherence packaging, delivery coordination). A medication sync plan was created to allow patient to get all medications delivered once every 30 to 90 days per patient preference. Patient understands they have freedom to choose pharmacy and clinical pharmacist will coordinate care between all prescribers and [facility].  Follow Up:  Patient agrees to Care Plan and Follow-up. Future Appointments  Date Time Provider [place]  05/30/2021  2:20 PM Lendon Colonel, NP [facility]  [facility]  07/22/2021  2:20 PM Minus Breeding, MD [facility]  10/13/2021  8:45 AM [facility] HEALTH COACH [place], PharmD Clinical Pharmacist [phone]

## 2021-05-27 ENCOUNTER — Ambulatory Visit (INDEPENDENT_AMBULATORY_CARE_PROVIDER_SITE_OTHER): Payer: Medicare HMO | Admitting: Pharmacist

## 2021-05-27 DIAGNOSIS — E1140 Type 2 diabetes mellitus with diabetic neuropathy, unspecified: Secondary | ICD-10-CM

## 2021-05-27 DIAGNOSIS — I10 Essential (primary) hypertension: Secondary | ICD-10-CM

## 2021-05-27 NOTE — Patient Instructions (Addendum)
Visit Information   Goals Addressed             This Visit's Progress    Improve medication adherence   On track    Timeframe:  Long-Range Goal Priority:  High Start Date:   01/21/21                          Expected End Date: 07/21/21                      Follow Up Date 04/22/21    Work to get set up with [facility] for pill packs.   Why is this important?   These steps will help you keep on track with your medicines.   Notes:         Patient Care Plan: General Pharmacy (Adult)     Problem Identified: HTN, DM   Priority: High  Onset Date: 05/27/2021     Long-Range Goal: Patient-Specific Goal   Start Date: 11/13/2020  Expected End Date: 11/13/2021  Recent Progress: On track  Priority: High  Note:   Current Barriers:  Unable to self administer medications as prescribed  Pharmacist Clinical Goal(s):  Patient will contact provider office for questions/concerns as evidenced notation of same in electronic health record through collaboration with PharmD and provider.   Interventions: 1:1 collaboration with Allwardt, Randa Evens, PA-C regarding development and update of comprehensive plan of care as evidenced by provider attestation and co-signature Inter-disciplinary care team collaboration (see longitudinal plan of care) Comprehensive medication review performed; medication list updated in electronic medical record  Hypertension (BP goal <130/80) -Controlled -Current treatment: Carvedilol 3.125 mg twice daily Appropriate, Effective, Safe, Accessible  Lisinopril 20 mg once daily  Appropriate, Effective, Safe, Accessible  Furosemide 80 mg three times daily   Appropriate, Effective, Safe, Accessible  -Current home readings: none provided, does have cuff and will provide/ask for asssitance using when next seen  -Denies hypotensive/hypertensive symptoms -Educated on Symptoms of hypotension and importance of maintaining adequate hydration; -Counseled to monitor BP  at home 1-2x/week in addition to daily weights, document, and provide log at future appointments -Recommended to continue current medication  Update 01/21/21 Reports BP around 130/80. Denies any recent dizziness or HA's. Stated she just took a home COVID-19 test and it was positive. Continue to monitor for now.  Update 05/27/21 Not checking BP at home lately No dizziness or HA BP in hospital was elevated, patient reports feeling more like herself now. She is living at home alone, encouraged her to reach out to me directly should she have any concerns or need immediate questions answered.   Diabetes (A1c goal <7%) -Controlled -Has not located blood glucose meter, is open to receiving meter now. -Current medications: Metformin 1000 mg twice daily Appropriate, Effective, Safe, Accessible  Lantus 25 units into the skin at bedtime Appropriate, Effective, Safe, Accessible  -Current home glucose readings fasting glucose: is not testing -Denies hypoglycemic/hyperglycemic symptoms -Current exercise: no formal exercise -Educated on A1c and blood sugar goals; Prevention and management of hypoglycemic episodes; Benefits of routine self-monitoring of blood sugar; -Recommended to continue current medication Will order new BG monitor after review  Update 01/21/21 Fasting this morning @ 5am - 110 Highest reported sugar around 150 She has gotten new meter and has just started checking her sugars at home Denies hypoglycemia Continue current meds for now.  Pill packs to help with adherence  Update 05/27/20 Patient has not checked  her sugar at home recently, have asked she do this a few times per week at least and let me know if fasting is consistently > 130 or post prandial is > 200. She reports not feeling well last month but is more back to her normal self lately. Denies any glucose < 80. She is now set up in [facility] pill packs, however her mail order keeps sending her prescriptions which  is messing up the synchronization. Daughter has called to inform mail order she no longer needs. Continue current meds - due for updated A1c  Patient Goals/Self-Care Activities Patient will:  - focus on medication adherence by using pill packaging.  Setting up with [facility] pill packs for adherence and ease of medication administration.  Follow Up Plan: CMA fu 30 days on adherence       Patient verbalizes understanding of instructions provided today and agrees to view in [place].  Telephone follow up appointment with pharmacy team member scheduled for: 3 months  Edythe Clarity, [place], PharmD Clinical Pharmacist  [facility] [phone]

## 2021-05-30 ENCOUNTER — Encounter: Payer: Self-pay | Admitting: Adult Health

## 2021-05-30 ENCOUNTER — Other Ambulatory Visit: Payer: Self-pay

## 2021-05-30 ENCOUNTER — Ambulatory Visit: Payer: Medicare HMO | Admitting: Nurse Practitioner

## 2021-05-30 VITALS — BP 130/80 | HR 83 | Ht 61.0 in | Wt 194.0 lb

## 2021-05-30 DIAGNOSIS — I10 Essential (primary) hypertension: Secondary | ICD-10-CM | POA: Diagnosis not present

## 2021-05-30 DIAGNOSIS — I5033 Acute on chronic diastolic (congestive) heart failure: Secondary | ICD-10-CM

## 2021-05-30 DIAGNOSIS — I4821 Permanent atrial fibrillation: Secondary | ICD-10-CM

## 2021-05-30 DIAGNOSIS — E1169 Type 2 diabetes mellitus with other specified complication: Secondary | ICD-10-CM | POA: Diagnosis not present

## 2021-05-30 DIAGNOSIS — Z9189 Other specified personal risk factors, not elsewhere classified: Secondary | ICD-10-CM | POA: Diagnosis not present

## 2021-05-30 DIAGNOSIS — J9611 Chronic respiratory failure with hypoxia: Secondary | ICD-10-CM | POA: Diagnosis not present

## 2021-05-30 DIAGNOSIS — Z9981 Dependence on supplemental oxygen: Secondary | ICD-10-CM

## 2021-05-30 DIAGNOSIS — E669 Obesity, unspecified: Secondary | ICD-10-CM | POA: Diagnosis not present

## 2021-05-30 DIAGNOSIS — W19XXXD Unspecified fall, subsequent encounter: Secondary | ICD-10-CM | POA: Diagnosis not present

## 2021-05-30 DIAGNOSIS — G4733 Obstructive sleep apnea (adult) (pediatric): Secondary | ICD-10-CM

## 2021-05-30 MED ORDER — APIXABAN 5 MG PO TABS: 5.0000 mg | ORAL_TABLET | Freq: Two times a day (BID) | ORAL | 1 refills | Status: DC

## 2021-05-30 MED ORDER — METOLAZONE 2.5 MG PO TABS: 2.5000 mg | ORAL_TABLET | Freq: Every day | ORAL | 0 refills | Status: DC

## 2021-05-30 MED ORDER — POTASSIUM CHLORIDE CRYS ER 20 MEQ PO TBCR: 20.0000 meq | EXTENDED_RELEASE_TABLET | Freq: Two times a day (BID) | ORAL | 2 refills | Status: DC

## 2021-05-30 NOTE — Progress Notes (Signed)
Office Visit    Patient Name: [name] Date of Encounter: 05/30/2021  Primary Care Provider:  Allwardt, Randa Evens, PA-C Primary Cardiologist:  Minus Breeding, MD  Chief Complaint    81 year old female with a history of diastolic heart failure, persistent atrial fibrillation, medication non-adherence, frequent falls, OSA on BiPAP at night, chronic respiratory failure on nightly home oxygen, type 2 diabetes, OA, RLS, anxiety, and hearing loss who presents for follow-up related to atrial fibrillation and diastolic heart failure.  Past Medical History    Past Medical History:  Diagnosis Date   AK (actinic keratosis)    Anxiety    occasional   Breast cancer (HCC)    R breast, s/p radiation 34Gy/28f 04/29/10-05/05/10   CTS (carpal tunnel syndrome)    Left   Diabetes (HCC)    [place]    Dx w/ hospitalization 06/2015 for respiratory failure   Facial asymmetry, acquired 08/20/2016   From injury   Falls frequently    "can not pick left leg up"   Hearing loss    bilateral hearing aides   History of environmental allergies    History of kidney stones 20 years ago   Hypertension    MDD (major depressive disorder)    No blood products ([employer])  09/11/2013   Osteoarthritis    hx shoulder, knee, back, wrist pain; saw Dr. AWynelle Link   Personal history of radiation therapy    RESTLESS LEGS SYNDROME 05/05/2007   Qualifier: Diagnosis of  By: CGwenette GreetMD, KArmando Reichert   RLS (restless legs syndrome)    Sleep apnea    Past Surgical History:  Procedure Laterality Date   APPENDECTOMY  age 81  BREAST LUMPECTOMY Right 2012   BREAST SURGERY  2012   rt breast lumpectomy   CATARACT EXTRACTION W/PHACO Left 08/12/2020   Procedure: CATARACT EXTRACTION PHACO AND INTRAOCULAR LENS PLACEMENT LEFT EYE;  Surgeon: WBaruch Goldmann MD;  Location: [facility];  Service: Ophthalmology;  Laterality: Left;  left CDE:13.68   CATARACT EXTRACTION W/PHACO Right 08/26/2020   Procedure: CATARACT  EXTRACTION PHACO AND INTRAOCULAR LENS PLACEMENT RIGHT EYE;  Surgeon: WBaruch Goldmann MD;  Location: [facility];  Service: Ophthalmology;  Laterality: Right;  right CDE=12.48   flex signoidoscopy     INCISION AND DRAINAGE PERIRECTAL ABSCESS     JOINT REPLACEMENT Right 2006   knee   REPLACEMENT TOTAL KNEE Right    TOTAL KNEE ARTHROPLASTY Left 08/06/2014   Procedure: LEFT TOTAL KNEE ARTHROPLASTY;  Surgeon: FGaynelle Arabian MD;  Location: [facility];  Service: Orthopedics;  Laterality: Left;   TUBAL LIGATION  1979    Allergies  Allergies  Allergen Reactions   Sulfa Antibiotics Swelling     Facial Swelling "swelling lips"    Sulfonamide Derivatives Swelling    Lips Swelling   Other     BLOOD PRODUCT REFUSAL    History of Present Illness    81year old female with the above past medical history including diastolic heart failure, persistent atrial fibrillation, medication non-adherence, frequent falls, OSA on BiPAP at night, chronic respiratory failure on nightly home oxygen, type 2 diabetes, OA, RLS, anxiety, and hearing loss.  Myoview in 2007 in setting of chest pressure and dyspnea on exertion was negative for ischemia.  Echocardiogram in 2017 showed EF 60-65%, G1 DD, no significant valvular abnormalities. Repeat echocardiogram and 2021 following a hospitalization for acute hypoxia in the setting of acute on chronic diastolic heart failure showed an EF 60-65%, grade  1 diastolic dysfunction, moderate LVH. Outpatient monitor in December 2021 setting of new onset atrial fibrillation showed persistent atrial fibrillation, rate controlled, frequent ventricular ectopy, including bigeminy and trigeminy.  She was started on Eliquis. She was last seen in the office on April 18, 2021 and reported abdominal fullness, weight gain.  She had stopped taking her Eliquis after her niece fell and hit her head and died while taking the medication. She also reported occasional palpitations and there was some confusion  regarding her medication regimen, particularly her Lasix. She was started on aspirin 325 mg to help minimize stroke risk given permanent atrial fibrillation, though suboptimal.  Her Lasix was increased for 3 days, followed by resumption of her Lasix 80 mg twice daily.  Home health services were offered, however, she declined.   Unfortunately, she presented to the ED on 05/05/2021 with complaints of altered mental status and fall with injury to her right foot that occurred 1 week prior to ED visit.  She had not been wearing her home oxygen. Chest x-ray and CT of the head were unremarkable.  The right foot showed fracture through the tarsal and she was referred to orthopedic surgery.  She was advised to continue her home oxygen therapy as hypoxia likely contributed to her altered mental status.  She presents today for follow-up accompanied by her husband. Since her last visit she reports ongoing shortness of breath at rest and with activity, and increased lower extremity edema. Her weight is up 4 pounds since her last visit on May 05, 2021.  He is not taking her Lasix as prescribed and reports taking it only once a day, sometimes 2 times a day.  He is drinking more than 2L/day and reports eating salty snacks daily, including Cheetos. She states she does not want to go back to the hospital. She denies worsening orthopnea, PND, or early satiety.   Home Medications    Current Outpatient Medications  Medication Sig Dispense Refill   apixaban (ELIQUIS) 5 MG TABS tablet Take 1 tablet (5 mg total) by mouth 2 (two) times daily. 60 tablet 1   blood glucose meter kit and supplies Dispense based on patient and insurance preference. Use up to two  times daily as directed. (FOR ICD-10 E10.9, E11.9). 1 each 0   carvedilol (COREG) 3.125 MG tablet TAKE 1 TABLET TWICE DAILY WITH A MEAL 180 tablet 1   Cholecalciferol (VITAMIN D3) 50 MCG (2000 UT) CHEW Chew 4,000 Units by mouth daily.     Cyanocobalamin (B-12 PO) Take  2 tablets by mouth daily.     HYDROcodone-acetaminophen (NORCO/VICODIN) 5-325 MG tablet Take 1 tablet by mouth 2 (two) times daily as needed for moderate pain.     insulin glargine (LANTUS SOLOSTAR) 100 UNIT/ML Solostar Pen Inject 25 Units into the skin at bedtime. 30 mL 2   Insulin Pen Needle (DROPLET PEN NEEDLES) 32G X 4 MM MISC USE AS DIRECTED ONE TIME DAILY 100 each 6   lisinopril (ZESTRIL) 20 MG tablet Take 1 tablet (20 mg total) by mouth daily. 90 tablet 2   pramipexole (MIRAPEX) 1 MG tablet TAKE 1 TABLET TWICE DAILY 180 tablet 0   triamcinolone cream (KENALOG) 0.1 % Apply 1 application topically 2 (two) times daily as needed (redness). Apply to red areas on legs 45 g 1   furosemide (LASIX) 80 MG tablet Take 1 tablet (80 mg total) by mouth 3 (three) times daily. (Patient taking differently: Take 80 mg by mouth 2 (two) times daily.) 270  tablet 0   metFORMIN (GLUCOPHAGE) 1000 MG tablet Take 1 tablet (1,000 mg total) by mouth in the morning and at bedtime. 180 tablet 0   metolazone (ZAROXOLYN) 2.5 MG tablet Take 1 tablet (2.5 mg total) by mouth daily for 1 day. 1 tablet 0   potassium chloride SA (KLOR-CON M) 20 MEQ tablet Take 1 tablet (20 mEq total) by mouth 2 (two) times daily. Take with Lasix 90 tablet 2   No current facility-administered medications for this visit.     Review of Systems    She denies chest pain, palpitations, pnd, orthopnea, n, v, dizziness, syncope, or early satiety. All other systems reviewed and are otherwise negative except as noted above.   Physical Exam    VS:  BP 130/80 (BP Location: Left Arm, Patient Position: Sitting, Cuff Size: Normal)    Pulse 83    Ht _0  (1.549 m)    Wt 194 lb (88 kg)    BMI 36.66 kg/m  GEN: Well nourished, well developed, in n o acute distress. HEENT: normal. Neck: Supple, no JVD, carotid bruits, or masses. Cardiac: RRR, no murmurs, rubs, or gallops. No clubbing, cyanosis, 2+ BLE pitting edema.  Radials/DP/PT 2+ and equal bilaterally.   Respiratory:  Respirations regular and unlabored, clear to auscultation bilaterally. GI: Obese, nontender, distended, BS + x 4. MS: no deformity or atrophy. Skin: warm and dry, no rash. Neuro:  Strength and sensation are intact. Psych: Normal affect.  Accessory Clinical Findings    ECG personally reviewed by me today - No EKG in office today.   Lab Results  Component Value Date   WBC 13.5 (H) 05/05/2021   HGB 15.0 05/05/2021   HCT 44.0 05/05/2021   MCV 96.6 05/05/2021   PLT 239 05/05/2021   Lab Results  Component Value Date   CREATININE 0.88 05/05/2021   BUN 24 (H) 05/05/2021   NA 141 05/05/2021   K 3.7 05/05/2021   CL 101 05/05/2021   CO2 32 05/05/2021   Lab Results  Component Value Date   ALT 37 05/05/2021   AST 41 05/05/2021   ALKPHOS 75 05/05/2021   BILITOT 0.9 05/05/2021   Lab Results  Component Value Date   CHOL 101 06/11/2020   HDL 31.40 (L) 06/11/2020   LDLCALC 51 06/11/2020   LDLDIRECT 111.6 02/14/2010   TRIG 95.0 06/11/2020   CHOLHDL 3 06/11/2020    Lab Results  Component Value Date   HGBA1C 7.4 (H) 02/10/2021    Assessment & Plan    1. Chronic diastolic heart failure: Most recent echo 06/2019 showed an EF 41-63%, grade 1 diastolic dysfunction, moderate LVH. Was thought to be volume up at her last visit, Lasix was increased for 3 days followed by resumption of Lasix 80 mg twice daily. She has not been taking her Lasix as prescribed. She reports taking Lasix sometimes once a day, rarely 2 times a day.  Her weight is up 4 pounds since her last visit and she has increased bilateral lower extremity edema. She reports feeling more short of breath.  She is drinking more than 2 L of fluid a day and reports eating salty snacks regularly including Cheetos which she says are her favorite.  I I spent a long time discussing with her my concern over her worsening heart failure symptoms, medication nonadherence, and dietary indiscretion and the possible need for IV  diuresis in the hospital setting.  She does not want to go to the hospital at this time.  We will check BNP, BMET today. I will have her increase her Lasix to 80 mg 3 times daily x 1 day with a one time dose of metolazone 2.5 mg of   prior to her first dose of Lasix tomorrow, followed by resumption of Lasix 80 mg twice daily.  I advised her to take an extra dose of potassium with her extra dose of Lasix tomorrow.  Emphasized the importance of sodium restriction, fluid restriction and daily weights. Discussed ED precautions.  I advised her to call us for worsening shortness of breath, edema, or weight gain. She verbalized understanding.  Ideally, I will have her follow-up in 1 week in the clinic as she is high risk for need of hospital admission.  2. Permanent atrial fibrillation: Rate controlled.  Her last visit she reported that she had stopped taking her Eliquis over the summer after her niece fell and hit her head and died while taking the medication. She also reported occasional palpitations. She was started on aspirin 325 mg daily.  Denies palpitations today.  After long discussion of the benefits and risks of Eliquis, she is agreeable to resume her Eliquis 5 mg twice daily.  Will stop aspirin. continue carvedilol.   3. Type 2 diabetes: Hemoglobin A1c was 7.4 on 02/10/2021. Managed and monitored by PCP.  4. Chronic hypoxic respiratory failure on home oxygen/OSA/H/o altered mental status/fall: To ED on 05/05/2021 with complaints of altered mental status and fall with injury to her right foot that occurred 1 week prior to ED visit. She had not been wearing her home oxygen. Chest x-ray and CT of the head were unremarkable. The right foot showed fracture through the tarsal and she was referred to orthopedic surgery. The right foot showed fracture through the tarsal and she was referred to orthopedic surgery.  She was advised to continue her home oxygen therapy as hypoxia had likely contributed to her altered  mental status.  Her mental status seems returned to baseline today.  Reports adherence to her CPAP.  She has not had any recurrent falls.   5. Self-care deficiency/non-adherence to medication regimen: She lives at home with her husband and tells me today she is tired of being stuck at home. She does not drive. Home health was discussed at her last visit. I once again discussed the benefits of home health and offered home health services to her, though she declines. I fear that this will be continued to be a problem especially given her nonadherence to medication regimen and dietary indiscretion.  She would benefit from a goals of care discussion at some point in the future.  Disposition: Follow-up in 1 week.  Lenna Sciara, NP 05/30/2021, 3:09 PM

## 2021-05-30 NOTE — Patient Instructions (Addendum)
Medication Instructions:  Stop Asprin. Start Eliquis 5 mg ( Take 1 Tablet Twice Daily). Take Lasix 80 mg Three Times Daily for 1 Day. Take Metolazone 2.5 mg on 05/31/21 Take 30 minutes prior to Lasix 80 mg on an empty stomach. Take Potassium with each Lasix Tablet. Then on 1/15/ Resume Lasix 80 mg Twice Daily with Potassium 20 meq Twice Daily. *If you need a refill on your cardiac medications before your next appointment, please call your pharmacy*   Lab Work: BMET<BNP If you have labs (blood work) drawn today and your tests are completely normal, you will receive your results only by: [place] (if you have MyChart) OR A paper copy in the mail If you have any lab test that is abnormal or we need to change your treatment, we will call you to review the results.   Testing/Procedures: No Testing   Follow-Up: At [facility], you and your health needs are our priority.  As part of our continuing mission to provide you with exceptional heart care, we have created designated Provider Care Teams.  These Care Teams include your primary Cardiologist (physician) and Advanced Practice Providers (APPs -  Physician Assistants and Nurse Practitioners) who all work together to provide you with the care you need, when you need it.  We recommend signing up for the patient portal called "MyChart".  Sign up information is provided on this After Visit Summary.  MyChart is used to connect with patients for Virtual Visits (Telemedicine).  Patients are able to view lab/test results, encounter notes, upcoming appointments, etc.  Non-urgent messages can be sent to your provider as well.   To learn more about what you can do with MyChart, go to [email or web address].    Your next appointment:   1 week(s)  The format for your next appointment:   In Person  Provider:   Minus Breeding, MD

## 2021-05-31 LAB — BRAIN NATRIURETIC PEPTIDE: BNP: 163.8 pg/mL — ABNORMAL HIGH (ref 0.0–100.0)

## 2021-05-31 LAB — BASIC METABOLIC PANEL
BUN/Creatinine Ratio: 22 (ref 12–28)
BUN: 21 mg/dL (ref 8–27)
CO2: 28 mmol/L (ref 20–29)
Calcium: 9.3 mg/dL (ref 8.7–10.3)
Chloride: 101 mmol/L (ref 96–106)
Creatinine, Ser: 0.94 mg/dL (ref 0.57–1.00)
Glucose: 102 mg/dL — ABNORMAL HIGH (ref 70–99)
Potassium: 3.9 mmol/L (ref 3.5–5.2)
Sodium: 145 mmol/L — ABNORMAL HIGH (ref 134–144)
eGFR: 61 mL/min/{1.73_m2} (ref >59)

## 2021-06-04 DIAGNOSIS — D6869 Other thrombophilia: Secondary | ICD-10-CM | POA: Diagnosis not present

## 2021-06-04 DIAGNOSIS — I4891 Unspecified atrial fibrillation: Secondary | ICD-10-CM | POA: Diagnosis not present

## 2021-06-04 DIAGNOSIS — M48062 Spinal stenosis, lumbar region with neurogenic claudication: Secondary | ICD-10-CM | POA: Diagnosis not present

## 2021-06-05 ENCOUNTER — Telehealth: Payer: Self-pay | Admitting: Adult Health

## 2021-06-05 NOTE — Telephone Encounter (Signed)
Called [employer] and was placed on hold with wait time being 20 mins. Not sure way claim number and DOS is being asked when coworker told me [employer] can pull from Epic. Not sure what do do with this at this moment in time

## 2021-06-09 ENCOUNTER — Telehealth: Payer: Self-pay | Admitting: Adult Health

## 2021-06-10 NOTE — Progress Notes (Signed)
Cardiology Office Note   Date:  06/11/2021   ID:  JORY WELKE, DOB December 13, 1940, MRN [id number]  PCP:  Fredirick Lathe, PA-C  Cardiologist:   Minus Breeding, MD   Chief Complaint  Patient presents with   Edema      History of Present Illness: [name] is a 81 y.o. female who presents for evaluation of chronic diastolic dysfunction.   She also has had atrial fib.   She, after much discussion with her daughter and the patient, was started on Eliquis.  She has had good rate control on a monitor.   I had a long discussion with her daughter who lives in [place] after initially diagnosing her atrial fibrillation and we came to the decision to use this as she is high risk for thromboembolism and has not been falling recently.  She has had a history of falls.    She was seen last week and had increased edema.  She was given increased diuretic.  She returns for follow up.    She thinks her legs are still just as swollen.  It sounds like she keeps her feet on the ground quite a bit.  She gets slowly around with a walker.  She has not had any new falls since December.  She does not notice her atrial fibrillation.  She is had no bleeding issues.  She is not having any new shortness of breath.  She wears her oxygen at night.  Of note she is not taking her Eliquis again.  She had stopped for a while because she just did not want to take it but we have counseled her on this.  I talked to her family about it as above.   Past Medical History:  Diagnosis Date   AK (actinic keratosis)    Anxiety    occasional   Breast cancer ([place])    R breast, s/p radiation 34Gy/39f 04/29/10-05/05/10   CTS (carpal tunnel syndrome)    Left   Diabetes (HCC)    [place]    Dx w/ hospitalization 06/2015 for respiratory failure   Facial asymmetry, acquired 08/20/2016   From injury   Falls frequently    "can not pick left leg up"   Hearing loss    bilateral hearing aides   History of  environmental allergies    History of kidney stones 20 years ago   Hypertension    MDD (major depressive disorder)    No blood products ([employer])  09/11/2013   Osteoarthritis    hx shoulder, knee, back, wrist pain; saw Dr. AWynelle Link   Personal history of radiation therapy    RESTLESS LEGS SYNDROME 05/05/2007   Qualifier: Diagnosis of  By: CGwenette GreetMD, KArmando Reichert   RLS (restless legs syndrome)    Sleep apnea     Past Surgical History:  Procedure Laterality Date   APPENDECTOMY  age 81  BREAST LUMPECTOMY Right 2012   BREAST SURGERY  2012   rt breast lumpectomy   CATARACT EXTRACTION W/PHACO Left 08/12/2020   Procedure: CATARACT EXTRACTION PHACO AND INTRAOCULAR LENS PLACEMENT LEFT EYE;  Surgeon: WBaruch Goldmann MD;  Location: [facility];  Service: Ophthalmology;  Laterality: Left;  left CDE:13.68   CATARACT EXTRACTION W/PHACO Right 08/26/2020   Procedure: CATARACT EXTRACTION PHACO AND INTRAOCULAR LENS PLACEMENT RIGHT EYE;  Surgeon: WBaruch Goldmann MD;  Location: [facility];  Service: Ophthalmology;  Laterality: Right;  right CDE=12.48   flex signoidoscopy  INCISION AND DRAINAGE PERIRECTAL ABSCESS     JOINT REPLACEMENT Right 2006   knee   REPLACEMENT TOTAL KNEE Right    TOTAL KNEE ARTHROPLASTY Left 08/06/2014   Procedure: LEFT TOTAL KNEE ARTHROPLASTY;  Surgeon: Gaynelle Arabian, MD;  Location: [facility];  Service: Orthopedics;  Laterality: Left;   TUBAL LIGATION  1979     Current Outpatient Medications  Medication Sig Dispense Refill   apixaban (ELIQUIS) 5 MG TABS tablet Take 1 tablet (5 mg total) by mouth 2 (two) times daily. 60 tablet 1   blood glucose meter kit and supplies Dispense based on patient and insurance preference. Use up to two  times daily as directed. (FOR ICD-10 E10.9, E11.9). 1 each 0   carvedilol (COREG) 3.125 MG tablet TAKE 1 TABLET TWICE DAILY WITH A MEAL 180 tablet 1   Cholecalciferol (VITAMIN D3) 50 MCG (2000 UT) CHEW Chew 4,000 Units by mouth daily.      Cyanocobalamin (B-12 PO) Take 2 tablets by mouth daily.     HYDROcodone-acetaminophen (NORCO/VICODIN) 5-325 MG tablet Take 1 tablet by mouth 2 (two) times daily as needed for moderate pain.     insulin glargine (LANTUS SOLOSTAR) 100 UNIT/ML Solostar Pen Inject 25 Units into the skin at bedtime. 30 mL 2   Insulin Pen Needle (DROPLET PEN NEEDLES) 32G X 4 MM MISC USE AS DIRECTED ONE TIME DAILY 100 each 6   lisinopril (ZESTRIL) 20 MG tablet Take 1 tablet (20 mg total) by mouth daily. 90 tablet 2   potassium chloride SA (KLOR-CON M) 20 MEQ tablet Take 1 tablet (20 mEq total) by mouth 2 (two) times daily. Take with Lasix 90 tablet 2   pramipexole (MIRAPEX) 1 MG tablet TAKE 1 TABLET TWICE DAILY 180 tablet 0   triamcinolone cream (KENALOG) 0.1 % Apply 1 application topically 2 (two) times daily as needed (redness). Apply to red areas on legs 45 g 1   metFORMIN (GLUCOPHAGE) 1000 MG tablet Take 1 tablet (1,000 mg total) by mouth in the morning and at bedtime. 180 tablet 0   metolazone (ZAROXOLYN) 2.5 MG tablet Take 1 tablet (2.5 mg total) by mouth daily for 1 day. 1 tablet 0   Torsemide 40 MG TABS Take 1 tablet by mouth 2 (two) times daily. 60 tablet 5   No current facility-administered medications for this visit.    Allergies:   Sulfa antibiotics, Sulfonamide derivatives, and Other    ROS:  Please see the history of present illness.   Otherwise, review of systems are positive for none.   All other systems are reviewed and negative.    PHYSICAL EXAM: VS:  BP 126/90 (BP Location: Left Arm, Patient Position: Sitting, Cuff Size: Large)    Pulse 84    Ht '5\' 1"'  (1.549 m)    Wt 194 lb (88 kg)    BMI 36.66 kg/m  , BMI Body mass index is 36.66 kg/m. GEN:  No distress NECK:  No jugular venous distention at 90 degrees, waveform within normal limits, carotid upstroke brisk and symmetric, no bruits, no thyromegaly LYMPHATICS:  No cervical adenopathy LUNGS:  Clear to auscultation bilaterally BACK:  No CVA  tenderness CHEST:  Unremarkable HEART:  S1 and S2 within normal limits, no S3, no clicks, no rubs, no murmurs, irregular ABD:  Positive bowel sounds normal in frequency in pitch, no bruits, no rebound, no guarding, unable to assess midline mass or bruit with the patient seated. EXT:  2 plus pulses throughout, moderate edema, no cyanosis  no clubbing SKIN:  No rashes no nodules NEURO:  Cranial nerves II through XII grossly intact, motor grossly intact throughout PSYCH:  Cognitively intact, oriented to person place and time    EKG:  EKG is not ordered today.    Recent Labs: 02/10/2021: TSH 1.69 05/05/2021: ALT 37; Hemoglobin 15.0; Platelets 239 05/30/2021: BNP 163.8; BUN 21; Creatinine, Ser 0.94; Potassium 3.9; Sodium 145    Lipid Panel    Component Value Date/Time   CHOL 101 06/11/2020 1032   TRIG 95.0 06/11/2020 1032   HDL 31.40 (L) 06/11/2020 1032   CHOLHDL 3 06/11/2020 1032   VLDL 19.0 06/11/2020 1032   LDLCALC 51 06/11/2020 1032   LDLDIRECT 111.6 02/14/2010 1406      Wt Readings from Last 3 Encounters:  06/11/21 194 lb (88 kg)  05/30/21 194 lb (88 kg)  05/05/21 190 lb 4.1 oz (86.3 kg)     Echo:  06/2019   1. Left ventricular ejection fraction, by estimation, is 60 to 65%. The  left ventricle has normal function. The left ventricle has no regional  wall motion abnormalities. There is mild to moderate left ventricular  hypertrophy. Left ventricular diastolic  parameters are consistent with Grade I diastolic dysfunction (impaired  relaxation).   2. Right ventricular systolic function is normal. The right ventricular  size is normal. Tricuspid regurgitation signal is inadequate for assessing  PA pressure.   3. The mitral valve is grossly normal, there is mild to moderate annular  calcification. Trivial mitral valve regurgitation.   4. The aortic valve was not well visualized. Mild aortic leaflet  calcification. Aortic valve regurgitation is mild.   5. IVC dilated,  but not able to estimate CVP (no sniff).   Other studies Reviewed: Additional studies/ records that were reviewed today include: Labs. Review of the above records demonstrates:  Please see elsewhere in the note.     ASSESSMENT AND PLAN:  Acute on chronic diastolic HF:   I am going to switch her to torsemide 40 mg twice daily.  She will get a basic metabolic profile in 2 weeks.  I reiterated 48 ounce fluid restriction.  I reiterated keeping her feet elevated.  We talked about salt restriction.  HTN: Her blood pressure is at target.  No change in therapy.  At target.  No change in therapy.   ATRIAL FIBRILLATION:    [name] has a CHA2DS2 - VASc score of 6.   I think she is taking the blood thinner now.  Current medicines are reviewed at length with the patient today.  The patient does not have concerns regarding medicines.  The following changes have been made:   As above  Labs/ tests ordered today include:   Orders Placed This Encounter  Procedures   Basic metabolic panel    Disposition:   FU with NP in six weeks.    Signed, Minus Breeding, MD  06/11/2021 11:06 AM    [facility]

## 2021-06-10 NOTE — Telephone Encounter (Signed)
Called and spoke with patient and she was unaware of any phone calls that she made with any questions or concerns about an appeal. I told patient to call office back if she had any questions in the future. Nothing further needed at this time

## 2021-06-11 ENCOUNTER — Other Ambulatory Visit: Payer: Self-pay

## 2021-06-11 ENCOUNTER — Ambulatory Visit: Payer: Medicare HMO | Admitting: Cardiology

## 2021-06-11 ENCOUNTER — Encounter: Payer: Self-pay | Admitting: Cardiology

## 2021-06-11 VITALS — BP 126/90 | HR 84 | Ht 61.0 in | Wt 194.0 lb

## 2021-06-11 DIAGNOSIS — J9611 Chronic respiratory failure with hypoxia: Secondary | ICD-10-CM | POA: Diagnosis not present

## 2021-06-11 DIAGNOSIS — Z5181 Encounter for therapeutic drug level monitoring: Secondary | ICD-10-CM

## 2021-06-11 DIAGNOSIS — I4819 Other persistent atrial fibrillation: Secondary | ICD-10-CM

## 2021-06-11 DIAGNOSIS — I10 Essential (primary) hypertension: Secondary | ICD-10-CM | POA: Diagnosis not present

## 2021-06-11 DIAGNOSIS — Z9981 Dependence on supplemental oxygen: Secondary | ICD-10-CM | POA: Diagnosis not present

## 2021-06-11 DIAGNOSIS — I5032 Chronic diastolic (congestive) heart failure: Secondary | ICD-10-CM | POA: Diagnosis not present

## 2021-06-11 MED ORDER — TORSEMIDE 40 MG PO TABS: 1.0000 | ORAL_TABLET | Freq: Two times a day (BID) | ORAL | 5 refills | Status: DC

## 2021-06-11 NOTE — Patient Instructions (Addendum)
Medication Instructions:  STOP FUROSEMIDE   START TORSEMIDE 40 MG TWICE A DAY   *If you need a refill on your cardiac medications before your next appointment, please call your pharmacy*  Lab Work: BMET IN 2 WEEKS  If you have labs (blood work) drawn today and your tests are completely normal, you will receive your results only by: [place] (if you have MyChart) OR A paper copy in the mail If you have any lab test that is abnormal or we need to change your treatment, we will call you to review the results.  Testing/Procedures: NONE  Follow-Up: At [facility], you and your health needs are our priority.  As part of our continuing mission to provide you with exceptional heart care, we have created designated Provider Care Teams.  These Care Teams include your primary Cardiologist (physician) and Advanced Practice Providers (APPs -  Physician Assistants and Nurse Practitioners) who all work together to provide you with the care you need, when you need it.  We recommend signing up for the patient portal called "MyChart".  Sign up information is provided on this After Visit Summary.  MyChart is used to connect with patients for Virtual Visits (Telemedicine).  Patients are able to view lab/test results, encounter notes, upcoming appointments, etc.  Non-urgent messages can be sent to your provider as well.   To learn more about what you can do with MyChart, go to [email or web address].    Your next appointment:   KEEP YOUR APPOINTMENT WITH DR [facility] AS SCHEDULED   Other Instructions LIMIT YOUR SODIUM INTAKE AND FLUID TO 48 OUNCES A DAY   [place]

## 2021-06-13 ENCOUNTER — Other Ambulatory Visit: Payer: Self-pay | Admitting: Adult Health

## 2021-06-17 ENCOUNTER — Other Ambulatory Visit: Payer: Self-pay | Admitting: Physician Assistant

## 2021-06-17 DIAGNOSIS — E1140 Type 2 diabetes mellitus with diabetic neuropathy, unspecified: Secondary | ICD-10-CM

## 2021-06-17 DIAGNOSIS — Z794 Long term (current) use of insulin: Secondary | ICD-10-CM | POA: Diagnosis not present

## 2021-06-17 DIAGNOSIS — I10 Essential (primary) hypertension: Secondary | ICD-10-CM

## 2021-06-17 DIAGNOSIS — M5416 Radiculopathy, lumbar region: Secondary | ICD-10-CM | POA: Diagnosis not present

## 2021-06-17 MED ORDER — TRIAMCINOLONE ACETONIDE 0.1 % EX CREA
1.0000 | TOPICAL_CREAM | Freq: Two times a day (BID) | CUTANEOUS | 1 refills | Status: DC | PRN
Start: 2021-06-17 — End: 2022-01-06

## 2021-07-01 ENCOUNTER — Ambulatory Visit: Payer: Self-pay | Admitting: *Deleted

## 2021-07-01 DIAGNOSIS — E1140 Type 2 diabetes mellitus with diabetic neuropathy, unspecified: Secondary | ICD-10-CM

## 2021-07-01 DIAGNOSIS — J9611 Chronic respiratory failure with hypoxia: Secondary | ICD-10-CM

## 2021-07-01 DIAGNOSIS — I5033 Acute on chronic diastolic (congestive) heart failure: Secondary | ICD-10-CM

## 2021-07-01 DIAGNOSIS — Z794 Long term (current) use of insulin: Secondary | ICD-10-CM

## 2021-07-01 NOTE — Chronic Care Management (AMB) (Signed)
°  Care Management   Follow Up Note   07/01/2021 Name: [name] MRN: [id number] DOB: 07-Aug-1940   Referred by: Allwardt, Randa Evens, PA-C Reason for referral : Chronic Care Management (RNCM CASE CLOSURE)   Third unsuccessful telephone outreach was attempted  by Scheduling Care guides in the paste fr RNCM. The patient was referred to the case management team for assistance with care management and care coordination. The patient's primary care provider has been notified of our unsuccessful attempts to make or maintain contact with the patient. The care management team is pleased to engage with this patient at any time in the future should he/she be interested in assistance from the care management team. RNCM will close nursing outreaches at this time while [place] will continue to follow.  Follow Up Plan: The CCM Pharmacy team will follow up with the patient and will provide direct communication to the PCP for this patient.   Hubert Azure RN, MSN RN Care Management Coordinator  [place] [phone] [email or web address]

## 2021-07-10 ENCOUNTER — Telehealth: Payer: Self-pay | Admitting: Pharmacist

## 2021-07-10 NOTE — Progress Notes (Addendum)
Chronic Care Management Pharmacy Assistant   Name: [name]  MRN: [id number] DOB: Aug 29, 1940   Reason for Encounter: Medication Coordination Call    Recent office visits:  None  Recent consult visits:  06/11/2021 [facility] (Cardiology) Minus Breeding, MD; no medication changes indicated.  05/30/2021 [facility] (Cardiology) Lenna Sciara, NP; Stop Asprin. Start Eliquis 5 mg ( Take 1 Tablet Twice Daily). Take Lasix 80 mg Three Times Daily for 1 Day. Take Metolazone 2.5 mg on 05/31/21 Take 30 minutes prior to Lasix 80 mg on an empty stomach. Take Potassium with each Lasix Tablet. Then on 1/15/ Resume Lasix 80 mg Twice Daily with Potassium 20 meq Twice Daily.  Hospital visits:  None in previous 6 months  Medications: Outpatient Encounter Medications as of 07/10/2021  Medication Sig   apixaban (ELIQUIS) 5 MG TABS tablet Take 1 tablet (5 mg total) by mouth 2 (two) times daily.   blood glucose meter kit and supplies Dispense based on patient and insurance preference. Use up to two  times daily as directed. (FOR ICD-10 E10.9, E11.9).   carvedilol (COREG) 3.125 MG tablet TAKE 1 TABLET TWICE DAILY WITH A MEAL   Cholecalciferol (VITAMIN D3) 50 MCG (2000 UT) CHEW Chew 4,000 Units by mouth daily.   Cyanocobalamin (B-12 PO) Take 2 tablets by mouth daily.   HYDROcodone-acetaminophen (NORCO/VICODIN) 5-325 MG tablet Take 1 tablet by mouth 2 (two) times daily as needed for moderate pain.   insulin glargine (LANTUS SOLOSTAR) 100 UNIT/ML Solostar Pen Inject 25 Units into the skin at bedtime.   Insulin Pen Needle (DROPLET PEN NEEDLES) 32G X 4 MM MISC USE AS DIRECTED ONE TIME DAILY   lisinopril (ZESTRIL) 20 MG tablet Take 1 tablet (20 mg total) by mouth daily.   metFORMIN (GLUCOPHAGE) 1000 MG tablet Take 1 tablet (1,000 mg total) by mouth in the morning and at bedtime.   metolazone (ZAROXOLYN) 2.5 MG tablet Take 1 tablet (2.5 mg total) by mouth daily for 1 day.   potassium chloride SA (KLOR-CON M) 20 MEQ  tablet Take 1 tablet (20 mEq total) by mouth 2 (two) times daily. Take with Lasix   pramipexole (MIRAPEX) 1 MG tablet TAKE 1 TABLET TWICE DAILY   Torsemide 40 MG TABS Take 1 tablet by mouth 2 (two) times daily.   triamcinolone cream (KENALOG) 0.1 % Apply 1 application topically 2 (two) times daily as needed (redness). Apply to red areas on legs   No facility-administered encounter medications on file as of 07/10/2021.   Reviewed chart for medication changes ahead of medication coordination call.  No OVs, Consults, or hospital visits since last care coordination call/Pharmacist visit. (If appropriate, list visit date, provider name)  No medication changes indicated OR if recent visit, treatment plan here.  BP Readings from Last 3 Encounters:  06/11/21 126/90  05/30/21 130/80  05/05/21 (!) 160/113    Lab Results  Component Value Date   HGBA1C 7.4 (H) 02/10/2021     Patient obtains medications through Adherence Packaging  30 Days   Last adherence delivery included:  Furosemide 80 mg one at breakfast one at evening meal Metformin 1000 mg one at breakfast one at evening meal Aspirin 325 mg 1 tablet daily  Lantus Pen 20 units at bedtime (pharmacy to call patient and advise on copay prior to filling so patient can check with mail order on cost to see which is most effective)  Patient is due for next adherence delivery on: 07/22/2021. Called patient and reviewed medications  and coordinated delivery.  This delivery to include: Torsemide 20 mg , 2 at breakfast, 2 in evening Metformin 1000 mg , 1 at breakfast, 1 at evening meal  Patient declined the following medications: Triamcinolone 0.1% cream  Confirmed delivery date of 07/22/2021, advised patient that pharmacy will contact them the morning of delivery.   Care Gaps: Medicare Annual Wellness: Overdue Ophthalmology Exam: Overdue since 12/20/2019 Foot Exam: Overdue since 01/24/2020 Hemoglobin A1C: 7.4% on 02/10/2021 Colonoscopy: Aged  out Dexa Scan: Completed 12/07/2019 Mammogram: Completed 03/25/2021  Future Appointments  Date Time Provider [place]  07/22/2021  2:20 PM Minus Breeding, MD [facility]  08/26/2021  2:45 PM [facility] CCM PHARMACIST [facility]  10/13/2021  8:45 AM [facility] HEALTH COACH [facility]   April D Calhoun, [place] Pharmacist Assistant [phone]   11 minutes spent in review, coordination, and documentation.  Meds are being filled at a few places, plan to get them all synched at [facility] moving forward.  Reviewed by: Beverly Milch, PharmD Clinical Pharmacist [phone]

## 2021-07-14 DIAGNOSIS — R69 Illness, unspecified: Secondary | ICD-10-CM | POA: Diagnosis not present

## 2021-07-21 DIAGNOSIS — M48062 Spinal stenosis, lumbar region with neurogenic claudication: Secondary | ICD-10-CM | POA: Diagnosis not present

## 2021-07-21 DIAGNOSIS — I4821 Permanent atrial fibrillation: Secondary | ICD-10-CM | POA: Insufficient documentation

## 2021-07-21 DIAGNOSIS — D6869 Other thrombophilia: Secondary | ICD-10-CM | POA: Diagnosis not present

## 2021-07-21 DIAGNOSIS — M5136 Other intervertebral disc degeneration, lumbar region: Secondary | ICD-10-CM | POA: Diagnosis not present

## 2021-07-21 NOTE — Progress Notes (Signed)
Cardiology Office Note   Date:  07/22/2021   ID:  [name], DOB July 13, 1940, MRN [id number]  PCP:  Fredirick Lathe, PA-C  Cardiologist:   Minus Breeding, MD   Chief Complaint  Patient presents with   Atrial Fibrillation      History of Present Illness: [name] is a 81 y.o. female who presents for evaluation of chronic diastolic dysfunction.   She also has had atrial fib.   She, after much discussion with her daughter and the patient, was started on Eliquis.  She has had good rate control on a monitor.   I had a long discussion several visits ago with her daughter who lives in [place] after initially diagnosing her atrial fibrillation and we came to the decision to use this as she is high risk for thromboembolism and has not been falling recently.  She has had a history of falls.    She was seen last week and had increased edema.  She was given increased diuretic.  She returns for follow up.    At the last visit she still had significant edema.   I switched her to Torsemide.  She did not present for follow up labs.  Today she comes back and she thinks the edema is better.  Looks better.  She is not having any new shortness of breath, PND or orthopnea.  She is not having any new palpitations, presyncope or syncope.  She gets around with her rolling walker.  She has not had any presyncope or syncope.  She had no new falls.   Past Medical History:  Diagnosis Date   AK (actinic keratosis)    Anxiety    occasional   Breast cancer ([place])    R breast, s/p radiation 34Gy/94f 04/29/10-05/05/10   CTS (carpal tunnel syndrome)    Left   Diabetes (HCC)    Diastolic [place]F [place]    Dx w/ hospitalization 06/2015 for respiratory failure   Facial asymmetry, acquired 08/20/2016   From injury   Falls frequently    "can not pick left leg up"   Hearing loss    bilateral hearing aides   History of environmental allergies    History of kidney stones 20 years ago   Hypertension    MDD  (major depressive disorder)    No blood products ([place])  09/11/2013   Osteoarthritis    hx shoulder, knee, back, wrist pain; saw Dr. AWynelle Link   Personal history of radiation therapy    RESTLESS LEGS SYNDROME 05/05/2007   Qualifier: Diagnosis of  By: CGwenette GreetMD, KArmando Reichert   RLS (restless legs syndrome)    Sleep apnea    Uses CPAP    Past Surgical History:  Procedure Laterality Date   APPENDECTOMY  age 81  BREAST LUMPECTOMY Right 2012   BREAST SURGERY  2012   rt breast lumpectomy   CATARACT EXTRACTION W/PHACO Left 08/12/2020   Procedure: CATARACT EXTRACTION PHACO AND INTRAOCULAR LENS PLACEMENT LEFT EYE;  Surgeon: WBaruch Goldmann MD;  Location: [facility];  Service: Ophthalmology;  Laterality: Left;  left CDE:13.68   CATARACT EXTRACTION W/PHACO Right 08/26/2020   Procedure: CATARACT EXTRACTION PHACO AND INTRAOCULAR LENS PLACEMENT RIGHT EYE;  Surgeon: WBaruch Goldmann MD;  Location: [facility];  Service: Ophthalmology;  Laterality: Right;  right CDE=12.48   flex signoidoscopy     INCISION AND DRAINAGE PERIRECTAL ABSCESS     JOINT REPLACEMENT Right 2006   knee   REPLACEMENT  TOTAL KNEE Right    TOTAL KNEE ARTHROPLASTY Left 08/06/2014   Procedure: LEFT TOTAL KNEE ARTHROPLASTY;  Surgeon: Gaynelle Arabian, MD;  Location: [facility];  Service: Orthopedics;  Laterality: Left;   TUBAL LIGATION  1979     Current Outpatient Medications  Medication Sig Dispense Refill   apixaban (ELIQUIS) 5 MG TABS tablet Take 1 tablet (5 mg total) by mouth 2 (two) times daily. 60 tablet 1   blood glucose meter kit and supplies Dispense based on patient and insurance preference. Use up to two  times daily as directed. (FOR ICD-10 E10.9, E11.9). 1 each 0   carvedilol (COREG) 3.125 MG tablet TAKE 1 TABLET TWICE DAILY WITH A MEAL 180 tablet 1   Cholecalciferol (VITAMIN D3) 50 MCG (2000 UT) CHEW Chew 4,000 Units by mouth daily.     Cyanocobalamin (B-12 PO) Take 2 tablets by mouth daily.     HYDROcodone-acetaminophen  (NORCO/VICODIN) 5-325 MG tablet Take 1 tablet by mouth 2 (two) times daily as needed for moderate pain.     insulin glargine (LANTUS SOLOSTAR) 100 UNIT/ML Solostar Pen Inject 25 Units into the skin at bedtime. 30 mL 2   Insulin Pen Needle (DROPLET PEN NEEDLES) 32G X 4 MM MISC USE AS DIRECTED ONE TIME DAILY 100 each 6   lisinopril (ZESTRIL) 20 MG tablet Take 1 tablet (20 mg total) by mouth daily. 90 tablet 2   metFORMIN (GLUCOPHAGE) 1000 MG tablet Take 1 tablet (1,000 mg total) by mouth in the morning and at bedtime. 180 tablet 0   metolazone (ZAROXOLYN) 2.5 MG tablet Take 1 tablet (2.5 mg total) by mouth daily for 1 day. 1 tablet 0   potassium chloride SA (KLOR-CON M) 20 MEQ tablet Take 1 tablet (20 mEq total) by mouth 2 (two) times daily. Take with Lasix 90 tablet 2   pramipexole (MIRAPEX) 1 MG tablet TAKE 1 TABLET TWICE DAILY 180 tablet 0   Torsemide 40 MG TABS Take 1 tablet by mouth 2 (two) times daily. 60 tablet 5   triamcinolone cream (KENALOG) 0.1 % Apply 1 application topically 2 (two) times daily as needed (redness). Apply to red areas on legs 45 g 1   No current facility-administered medications for this visit.    Allergies:   Sulfa antibiotics, Sulfonamide derivatives, and Other    ROS:  Please see the history of present illness.   Otherwise, review of systems are positive for none.   All other systems are reviewed and negative.    PHYSICAL EXAM: VS:  BP 128/60    Pulse 91    Ht $R'5\' 1"'Pe$  (1.549 m)    Wt 189 lb 6.4 oz (85.9 kg)    SpO2 96%    BMI 35.79 kg/m  , BMI Body mass index is 35.79 kg/m. GEN:  No distress NECK:  No jugular venous distention at 90 degrees, waveform within normal limits, carotid upstroke brisk and symmetric, no bruits, no thyromegaly LYMPHATICS:  No cervical adenopathy LUNGS:  Clear to auscultation bilaterally BACK:  No CVA tenderness CHEST:  Unremarkable HEART:  S1 and S2 within normal limits, no S3,  no clicks, no rubs, no murmurs, irregular ABD:  Positive  bowel sounds normal in frequency in pitch, no bruits, no rebound, no guarding, unable to assess midline mass or bruit with the patient seated. EXT:  2 plus pulses throughout, mild edema, no cyanosis no clubbing SKIN:  No rashes no nodules NEURO:  Cranial nerves II through XII grossly intact, motor grossly intact throughout  PSYCH:  Cognitively intact, oriented to person place and time   EKG:  EKG is not ordered today. NA   Recent Labs: 02/10/2021: TSH 1.69 05/05/2021: ALT 37; Hemoglobin 15.0; Platelets 239 05/30/2021: BNP 163.8; BUN 21; Creatinine, Ser 0.94; Potassium 3.9; Sodium 145    Lipid Panel    Component Value Date/Time   CHOL 101 06/11/2020 1032   TRIG 95.0 06/11/2020 1032   HDL 31.40 (L) 06/11/2020 1032   CHOLHDL 3 06/11/2020 1032   VLDL 19.0 06/11/2020 1032   LDLCALC 51 06/11/2020 1032   LDLDIRECT 111.6 02/14/2010 1406      Wt Readings from Last 3 Encounters:  07/22/21 189 lb 6.4 oz (85.9 kg)  06/11/21 194 lb (88 kg)  05/30/21 194 lb (88 kg)     Echo:  06/2019   1. Left ventricular ejection fraction, by estimation, is 60 to 65%. The  left ventricle has normal function. The left ventricle has no regional  wall motion abnormalities. There is mild to moderate left ventricular  hypertrophy. Left ventricular diastolic  parameters are consistent with Grade I diastolic dysfunction (impaired  relaxation).   2. Right ventricular systolic function is normal. The right ventricular  size is normal. Tricuspid regurgitation signal is inadequate for assessing  PA pressure.   3. The mitral valve is grossly normal, there is mild to moderate annular  calcification. Trivial mitral valve regurgitation.   4. The aortic valve was not well visualized. Mild aortic leaflet  calcification. Aortic valve regurgitation is mild.   5. IVC dilated, but not able to estimate CVP (no sniff).   Other studies Reviewed: Additional studies/ records that were reviewed today include:  None. Review of the above records demonstrates:  Please see elsewhere in the note.     ASSESSMENT AND PLAN:  Acute on chronic diastolic HF:    She seems to be euvolemic.  I am going to continue the meds as listed and check a basic metabolic profile today.  HTN: Her blood pressure is at target no change in therapy.   ATRIAL FIBRILLATION:    [name] has a CHA2DS2 - VASc score of 6.  She is tolerating anticoagulation.  I will check a CBC as well today.  Current medicines are reviewed at length with the patient today.  The patient does not have concerns regarding medicines.  The following changes have been made:   None  Labs/ tests ordered today include:  None  Orders Placed This Encounter  Procedures   CBC with Differential/Platelet   Basic metabolic panel    Disposition:   FU with six months if she has not moved to [facility], Minus Breeding, MD  07/22/2021 3:25 PM    [facility]

## 2021-07-22 ENCOUNTER — Encounter: Payer: Self-pay | Admitting: Cardiology

## 2021-07-22 ENCOUNTER — Ambulatory Visit: Payer: Medicare HMO | Admitting: Cardiology

## 2021-07-22 ENCOUNTER — Other Ambulatory Visit: Payer: Self-pay

## 2021-07-22 VITALS — BP 128/60 | HR 91 | Ht 61.0 in | Wt 189.4 lb

## 2021-07-22 DIAGNOSIS — I4821 Permanent atrial fibrillation: Secondary | ICD-10-CM

## 2021-07-22 DIAGNOSIS — I10 Essential (primary) hypertension: Secondary | ICD-10-CM | POA: Diagnosis not present

## 2021-07-22 DIAGNOSIS — I5033 Acute on chronic diastolic (congestive) heart failure: Secondary | ICD-10-CM

## 2021-07-22 LAB — CBC WITH DIFFERENTIAL/PLATELET
Basophils Absolute: 0 10*3/uL (ref 0.0–0.2)
Basos: 0 %
EOS (ABSOLUTE): 0.2 10*3/uL (ref 0.0–0.4)
Eos: 2 %
Hematocrit: 38.7 % (ref 34.0–46.6)
Hemoglobin: 13.7 g/dL (ref 11.1–15.9)
Immature Grans (Abs): 0 10*3/uL (ref 0.0–0.1)
Immature Granulocytes: 0 %
Lymphocytes Absolute: 2.1 10*3/uL (ref 0.7–3.1)
Lymphs: 21 %
MCH: 32.8 pg (ref 26.6–33.0)
MCHC: 35.4 g/dL (ref 31.5–35.7)
MCV: 93 fL (ref 79–97)
Monocytes Absolute: 0.8 10*3/uL (ref 0.1–0.9)
Monocytes: 8 %
Neutrophils Absolute: 7.2 10*3/uL — ABNORMAL HIGH (ref 1.4–7.0)
Neutrophils: 69 %
Platelets: 282 10*3/uL (ref 150–450)
RBC: 4.18 x10E6/uL (ref 3.77–5.28)
RDW: 12.4 % (ref 11.7–15.4)
WBC: 10.5 10*3/uL (ref 3.4–10.8)

## 2021-07-22 LAB — BASIC METABOLIC PANEL
BUN/Creatinine Ratio: 22 (ref 12–28)
BUN: 22 mg/dL (ref 8–27)
CO2: 28 mmol/L (ref 20–29)
Calcium: 8.9 mg/dL (ref 8.7–10.3)
Chloride: 101 mmol/L (ref 96–106)
Creatinine, Ser: 0.99 mg/dL (ref 0.57–1.00)
Glucose: 119 mg/dL — ABNORMAL HIGH (ref 70–99)
Potassium: 3.8 mmol/L (ref 3.5–5.2)
Sodium: 144 mmol/L (ref 134–144)
eGFR: 57 mL/min/{1.73_m2} — ABNORMAL LOW (ref >59)

## 2021-07-22 NOTE — Patient Instructions (Signed)
Medication Instructions:  ?Your physician recommends that you continue on your current medications as directed. Please refer to the Current Medication list given to you today.  ? ?*If you need a refill on your cardiac medications before your next appointment, please call your pharmacy* ? ?Lab Work: ?CBC/BMET TODAY  ? ?Testing/Procedures: ?NONE ? ?Follow-Up: ?At [facility], you and your health needs are our priority.  As part of our continuing mission to provide you with exceptional heart care, we have created designated Provider Care Teams.  These Care Teams include your primary Cardiologist (physician) and Advanced Practice Providers (APPs -  Physician Assistants and Nurse Practitioners) who all work together to provide you with the care you need, when you need it. ? ?We recommend signing up for the patient portal called "MyChart".  Sign up information is provided on this After Visit Summary.  MyChart is used to connect with patients for Virtual Visits (Telemedicine).  Patients are able to view lab/test results, encounter notes, upcoming appointments, etc.  Non-urgent messages can be sent to your provider as well.   ?To learn more about what you can do with MyChart, go to [email or web address].   ? ?Your next appointment:   ?6 month(s) ? ?The format for your next appointment:   ?In Person ? ?Provider:   ?Minus Breeding, MD { ? ?

## 2021-07-24 NOTE — Progress Notes (Signed)
Noted, thanks!

## 2021-08-04 ENCOUNTER — Other Ambulatory Visit: Payer: Self-pay | Admitting: Nurse Practitioner

## 2021-08-04 NOTE — Telephone Encounter (Signed)
Prescription refill request for Eliquis received. ?Indication:Afib ?Last office visit:3/23 ?Scr:0.9 ?Age: 81 ?Weight:85.9 kg ? ?Prescription refilled ? ?

## 2021-08-08 ENCOUNTER — Telehealth: Payer: Self-pay | Admitting: Pharmacist

## 2021-08-12 NOTE — Progress Notes (Signed)
? ? ?  Chronic Care Management ?Pharmacy Assistant  ? ?Name: [name]  MRN: [id number] DOB: 05-01-1941 ? ? ?Reason for Encounter: Medication Coordination Call ?  ? ?Recent office visits:  ?None ? ?Recent consult visits:  ?07/22/2021 [facility] (Cardiology) Minus Breeding, MD; no medication changes indicated. ? ?Hospital visits:  ?None in previous 6 months ? ?Medications: ?Outpatient Encounter Medications as of 08/08/2021  ?Medication Sig  ? apixaban (ELIQUIS) 5 MG TABS tablet TAKE 1 TABLET BY MOUTH 2 TIMES DAILY  ? blood glucose meter kit and supplies Dispense based on patient and insurance preference. Use up to two  times daily as directed. (FOR ICD-10 E10.9, E11.9).  ? carvedilol (COREG) 3.125 MG tablet TAKE 1 TABLET TWICE DAILY WITH A MEAL  ? Cholecalciferol (VITAMIN D3) 50 MCG (2000 UT) CHEW Chew 4,000 Units by mouth daily.  ? Cyanocobalamin (B-12 PO) Take 2 tablets by mouth daily.  ? HYDROcodone-acetaminophen (NORCO/VICODIN) 5-325 MG tablet Take 1 tablet by mouth 2 (two) times daily as needed for moderate pain.  ? insulin glargine (LANTUS SOLOSTAR) 100 UNIT/ML Solostar Pen Inject 25 Units into the skin at bedtime.  ? Insulin Pen Needle (DROPLET PEN NEEDLES) 32G X 4 MM MISC USE AS DIRECTED ONE TIME DAILY  ? lisinopril (ZESTRIL) 20 MG tablet Take 1 tablet (20 mg total) by mouth daily.  ? pramipexole (MIRAPEX) 1 MG tablet TAKE 1 TABLET TWICE DAILY  ? Torsemide 40 MG TABS Take 1 tablet by mouth 2 (two) times daily.  ? triamcinolone cream (KENALOG) 0.1 % Apply 1 application topically 2 (two) times daily as needed (redness). Apply to red areas on legs  ? ?No facility-administered encounter medications on file as of 08/08/2021.  ? ? ? ?*Unsuccessful attempt at reaching patient to complete this call** ? ?Future Appointments  ?Date Time Provider [place]  ?08/26/2021  2:45 PM [facility] CCM PHARMACIST [facility]  ?10/13/2021  8:45 AM [facility] HEALTH COACH [facility]  ? ?April D Calhoun, [place] ?Clinical Pharmacist  Assistant ?[phone]  ?

## 2021-08-20 NOTE — Progress Notes (Deleted)
? ? ?Chronic Care Management ?Pharmacy Note ? ?08/20/2021 ?Name:  [name] MRN:  [id number] DOB:  23-Jul-1940 ? ?Recommendations/changes: Getting patient set up with pill packaging through [facility].  She will get everything but her Lantus through them because mail order is her preferred and her Lantus price will be the best through them ? ?Subjective: ?[name] is an 81 y.o. year old female who is a primary patient of Allwardt, Alyssa M, PA-C.  The CCM team was consulted for assistance with disease management and care coordination needs.   ? ?Engaged with patient by telephone for follow up visit in response to provider referral for pharmacy case management and/or care coordination services.  ? ?Consent to Services:  ?The patient was given information about Chronic Care Management services, agreed to services, and gave verbal consent prior to initiation of services.  Please see initial visit note for detailed documentation.  ? ?Patient Care Team: ?Allwardt, Randa Evens, PA-C as PCP - General (Physician Assistant) ?Minus Breeding, MD as PCP - Cardiology (Cardiology) ?Suella Broad, MD as Consulting Physician (Physical Medicine and Rehabilitation) ?Minus Breeding, MD as Consulting Physician (Cardiology) ?Madelin Headings, DO (Optometry) ?Edythe Clarity, [facility] (Pharmacist) ?Cameron Sprang, MD as Consulting Physician (Neurology) ? ?Objective: ? ?Lab Results  ?Component Value Date  ? CREATININE 0.99 07/22/2021  ? CREATININE 0.94 05/30/2021  ? CREATININE 0.88 05/05/2021  ? ? ?Lab Results  ?Component Value Date  ? HGBA1C 7.4 (H) 02/10/2021  ? ?Last diabetic Eye exam:  ?Lab Results  ?Component Value Date/Time  ? HMDIABEYEEXA No Retinopathy 12/20/2018 12:00 AM  ?  ?Last diabetic Foot exam:  ?Lab Results  ?Component Value Date/Time  ? HMDIABFOOTEX done 02/14/2010 12:00 AM  ?  ? ?   ?Component Value Date/Time  ? CHOL 101 06/11/2020 1032  ? TRIG 95.0 06/11/2020 1032  ? HDL 31.40 (L) 06/11/2020 1032  ? CHOLHDL 3  06/11/2020 1032  ? VLDL 19.0 06/11/2020 1032  ? LDLCALC 51 06/11/2020 1032  ? LDLDIRECT 111.6 02/14/2010 1406  ? ? ? ?  Latest Ref Rng & Units 05/05/2021  ?  2:53 PM 02/10/2021  ?  2:52 PM 06/11/2020  ? 10:32 AM  ?Hepatic Function  ?Total Protein 6.5 - 8.1 g/dL 6.8   6.9   7.0    ?Albumin 3.5 - 5.0 g/dL 3.7   3.8   3.7    ?AST 15 - 41 U/L 41   16   18    ?ALT 0 - 44 U/L 37   12   13    ?Alk Phosphatase 38 - 126 U/L 75   100   102    ?Total Bilirubin 0.3 - 1.2 mg/dL 0.9   0.5   0.6    ? ? ?Lab Results  ?Component Value Date/Time  ? TSH 1.69 02/10/2021 02:52 PM  ? TSH 0.902 07/08/2019 01:02 PM  ? TSH 1.993 08/01/2016 02:48 PM  ? TSH 0.74 10/16/2011 01:48 PM  ? ? ? ?  Latest Ref Rng & Units 07/22/2021  ?  3:18 PM 05/05/2021  ?  3:48 PM 05/05/2021  ?  2:53 PM  ?CBC  ?WBC 3.4 - 10.8 x10E3/uL 10.5    13.5    ?Hemoglobin 11.1 - 15.9 g/dL 13.7   15.0   14.0    ?Hematocrit 34.0 - 46.6 % 38.7   44.0   42.2    ?Platelets 150 - 450 x10E3/uL 282    239    ? ? ?  Lab Results  ?Component Value Date/Time  ? VD25OH 32.73 01/03/2020 11:44 AM  ? VD25OH 22.48 (L) 12/18/2019 11:02 AM  ? ? ?Clinical ASCVD:  ?The ASCVD Risk score (Arnett DK, et al., 2019) failed to calculate for the following reasons: ?  The 2019 ASCVD risk score is only valid for ages 27 to 58   ? ? ?Social History  ? ?Tobacco Use  ?Smoking Status Never  ? Passive exposure: Never  ?Smokeless Tobacco Never  ? ?BP Readings from Last 3 Encounters:  ?07/22/21 128/60  ?06/11/21 126/90  ?05/30/21 130/80  ? ?Pulse Readings from Last 3 Encounters:  ?07/22/21 91  ?06/11/21 84  ?05/30/21 83  ? ?Wt Readings from Last 3 Encounters:  ?07/22/21 189 lb 6.4 oz (85.9 kg)  ?06/11/21 194 lb (88 kg)  ?05/30/21 194 lb (88 kg)  ? ? ?Assessment: Review of patient past medical history, allergies, medications, health status, including review of consultants reports, laboratory and other test data, was performed as part of comprehensive evaluation and provision of chronic care management services.   ? ?SDOH:  (Social Determinants of Health) assessments and interventions performed:  ? ? ?CCM Care Plan ? ?Allergies  ?Allergen Reactions  ? Sulfa Antibiotics Swelling  ?   Facial Swelling ?"swelling lips" ?  ? Sulfonamide Derivatives Swelling  ?  Lips Swelling  ? Other   ?  BLOOD PRODUCT REFUSAL  ? ? ?Medications Reviewed Today   ? ? Reviewed by Minus Breeding, MD (Physician) on 07/22/21 at 1525  Med List Status: <None>  ? ?Medication Order Taking? Sig Documenting Provider Last Dose Status Informant  ?apixaban (ELIQUIS) 5 MG TABS tablet [id number] Yes Take 1 tablet (5 mg total) by mouth 2 (two) times daily. Lenna Sciara, NP Taking Active   ?blood glucose meter kit and supplies [id number] Yes Dispense based on patient and insurance preference. Use up to two  times daily as directed. (FOR ICD-10 E10.9, E11.9). Allwardt, Randa Evens, PA-C Taking Active   ?carvedilol (COREG) 3.125 MG tablet [id number] Yes TAKE 1 TABLET TWICE DAILY WITH A MEAL Allwardt, Alyssa M, PA-C Taking Active   ?Cholecalciferol (VITAMIN D3) 50 MCG (2000 UT) CHEW [id number] Yes Chew 4,000 Units by mouth daily. [provider] Taking Active   ?Cyanocobalamin (B-12 PO) [id number] Yes Take 2 tablets by mouth daily. [provider] Taking Active Self  ?HYDROcodone-acetaminophen (NORCO/VICODIN) 5-325 MG tablet [id number] Yes Take 1 tablet by mouth 2 (two) times daily as needed for moderate pain. [provider] Taking Active Self  ?insulin glargine (LANTUS SOLOSTAR) 100 UNIT/ML Solostar Pen [id number] Yes Inject 25 Units into the skin at bedtime. Allwardt, Randa Evens, PA-C Taking Active   ?Insulin Pen Needle (DROPLET PEN NEEDLES) 32G X 4 MM MISC [id number] Yes USE AS DIRECTED ONE TIME DAILY Allwardt, Alyssa M, PA-C Taking Active   ?lisinopril (ZESTRIL) 20 MG tablet [id number] Yes Take 1 tablet (20 mg total) by mouth daily. Allwardt, Randa Evens, PA-C Taking Active   ?metFORMIN (GLUCOPHAGE) 1000 MG tablet [id number] Yes Take 1 tablet (1,000  mg total) by mouth in the morning and at bedtime. Allwardt, Randa Evens, PA-C Taking Active   ?metolazone (ZAROXOLYN) 2.5 MG tablet [id number] Yes Take 1 tablet (2.5 mg total) by mouth daily for 1 day. Lenna Sciara, NP Taking Active   ?potassium chloride SA (KLOR-CON M) 20 MEQ tablet [id number] Yes Take 1 tablet (20 mEq total) by mouth 2 (two) times daily. Take with Lasix Lenna Sciara, NP Taking Active   ?  pramipexole (MIRAPEX) 1 MG tablet [id number] Yes TAKE 1 TABLET TWICE DAILY Allwardt, Alyssa M, PA-C Taking Active   ?Torsemide 40 MG TABS [id number] Yes Take 1 tablet by mouth 2 (two) times daily. Minus Breeding, MD Taking Active   ?triamcinolone cream (KENALOG) 0.1 % [id number] Yes Apply 1 application topically 2 (two) times daily as needed (redness). Apply to red areas on legs Allwardt, Randa Evens, PA-C Taking Active   ? ?  ?  ? ?  ? ? ?Patient Active Problem List  ? Diagnosis Date Noted  ? Permanent atrial fibrillation ([place]) 07/21/2021  ? Persistent atrial fibrillation ([place]) 09/26/2020  ? Educated about COVID-19 virus infection 11/30/2019  ? Chronic respiratory failure with hypoxia and hypercapnia ([place]) 07/11/2019  ? Acute on chronic diastolic CHF (congestive heart failure) ([place]) 07/08/2019  ? Hypoxia 07/07/2019  ? Hypokalemia 05/03/2019  ? Facial asymmetry, acquired 08/20/2016  ? Fracture of knee prosthesis ([place]) 08/01/2016  ? Obesity 02/20/2016  ? Chronic pain syndrome 02/19/2016  ? Mild episode of recurrent major depressive disorder ([place]) 08/02/2015  ? Chronic diastolic congestive heart failure ([place]) 07/16/2015  ? Type 2 diabetes mellitus with diabetic neuropathy ([place]) 09/27/2014  ? Hearing loss - has hearing aides, follow by audiologist 10/11/2013  ? No blood products ([place])  09/11/2013  ? Disorder of bone and cartilage 06/10/2010  ? Breast cancer of upper-outer quadrant of right female breast ([place]) 04/04/2010  ? RESTLESS LEGS SYNDROME 05/05/2007  ? Obstructive sleep apnea 03/01/2007  ? Essential  hypertension 01/27/2007  ? Osteoarthritis 01/27/2007  ? ? ?Immunization History  ?Administered Date(s) Administered  ? Fluad Quad(high Dose 65+) 01/24/2019, 03/17/2021  ? Influenza Split 02/20/2011, 01/25/2012  ? I

## 2021-08-26 ENCOUNTER — Telehealth: Payer: Medicare HMO

## 2021-09-01 ENCOUNTER — Ambulatory Visit (INDEPENDENT_AMBULATORY_CARE_PROVIDER_SITE_OTHER): Payer: Medicare HMO | Admitting: Pharmacist

## 2021-09-01 DIAGNOSIS — I10 Essential (primary) hypertension: Secondary | ICD-10-CM

## 2021-09-01 DIAGNOSIS — E118 Type 2 diabetes mellitus with unspecified complications: Secondary | ICD-10-CM

## 2021-09-01 NOTE — Progress Notes (Signed)
? ? ?Chronic Care Management ?Pharmacy Note ? ?09/01/2021 ?Name:  [name] MRN:  [id number] DOB:  [birth year] ? ?Recommendations/changes: No changes at this time doing well.  Asked her to call and schedule FU with PCP for A1c and general physical.  She reports she is moving to [place], [place] to live with her youngest daughter within the next few months. ? ?DUE FOR: Foot exam, eye exam, A1c ? ?Subjective: ?[name] is an 81 y.o. year old female who is a primary patient of Allwardt, Alyssa M, PA-C.  The CCM team was consulted for assistance with disease management and care coordination needs.   ? ?Engaged with patient by telephone for follow up visit in response to provider referral for pharmacy case management and/or care coordination services.  ? ?Consent to Services:  ?The patient was given information about Chronic Care Management services, agreed to services, and gave verbal consent prior to initiation of services.  Please see initial visit note for detailed documentation.  ? ?Patient Care Team: ?Allwardt, Randa Evens, PA-C as PCP - General (Physician Assistant) ?Minus Breeding, MD as PCP - Cardiology (Cardiology) ?Suella Broad, MD as Consulting Physician (Physical Medicine and Rehabilitation) ?Minus Breeding, MD as Consulting Physician (Cardiology) ?Madelin Headings, DO (Optometry) ?Edythe Clarity, [facility] (Pharmacist) ?Cameron Sprang, MD as Consulting Physician (Neurology) ? ?Recent office visits:  ?None ?  ?Recent consult visits:  ?07/22/2021 OV (Cardiology) Minus Breeding, MD; no medication changes indicated. ?  ?Hospital visits:  ?None in previous 6 months ? ?Objective: ? ?Lab Results  ?Component Value Date  ? CREATININE 0.99 07/22/2021  ? CREATININE 0.94 05/30/2021  ? CREATININE 0.88 05/05/2021  ? ? ?Lab Results  ?Component Value Date  ? HGBA1C 7.4 (H) 02/10/2021  ? ?Last diabetic Eye exam:  ?Lab Results  ?Component Value Date/Time  ? HMDIABEYEEXA No Retinopathy 12/20/2018 12:00 AM  ?  ?Last diabetic Foot  exam:  ?Lab Results  ?Component Value Date/Time  ? HMDIABFOOTEX done 02/14/2010 12:00 AM  ?  ? ?   ?Component Value Date/Time  ? CHOL 101 06/11/2020 1032  ? TRIG 95.0 06/11/2020 1032  ? HDL 31.40 (L) 06/11/2020 1032  ? CHOLHDL 3 06/11/2020 1032  ? VLDL 19.0 06/11/2020 1032  ? LDLCALC 51 06/11/2020 1032  ? LDLDIRECT 111.6 02/14/2010 1406  ? ? ? ?  Latest Ref Rng & Units 05/05/2021  ?  2:53 PM 02/10/2021  ?  2:52 PM 06/11/2020  ? 10:32 AM  ?Hepatic Function  ?Total Protein 6.5 - 8.1 g/dL 6.8   6.9   7.0    ?Albumin 3.5 - 5.0 g/dL 3.7   3.8   3.7    ?AST 15 - 41 U/L 41   16   18    ?ALT 0 - 44 U/L 37   12   13    ?Alk Phosphatase 38 - 126 U/L 75   100   102    ?Total Bilirubin 0.3 - 1.2 mg/dL 0.9   0.5   0.6    ? ? ?Lab Results  ?Component Value Date/Time  ? TSH 1.69 02/10/2021 02:52 PM  ? TSH 0.902 07/08/2019 01:02 PM  ? TSH 1.993 08/01/2016 02:48 PM  ? TSH 0.74 10/16/2011 01:48 PM  ? ? ? ?  Latest Ref Rng & Units 07/22/2021  ?  3:18 PM 05/05/2021  ?  3:48 PM 05/05/2021  ?  2:53 PM  ?CBC  ?WBC 3.4 - 10.8 x10E3/uL 10.5    13.5    ?  Hemoglobin 11.1 - 15.9 g/dL 13.7   15.0   14.0    ?Hematocrit 34.0 - 46.6 % 38.7   44.0   42.2    ?Platelets 150 - 450 x10E3/uL 282    239    ? ? ?Lab Results  ?Component Value Date/Time  ? VD25OH 32.73 01/03/2020 11:44 AM  ? VD25OH 22.48 (L) 12/18/2019 11:02 AM  ? ? ?Clinical ASCVD:  ?The ASCVD Risk score (Arnett DK, et al., 2019) failed to calculate for the following reasons: ?  The 2019 ASCVD risk score is only valid for ages 56 to 64   ? ? ?Social History  ? ?Tobacco Use  ?Smoking Status Never  ? Passive exposure: Never  ?Smokeless Tobacco Never  ? ?BP Readings from Last 3 Encounters:  ?07/22/21 128/60  ?06/11/21 126/90  ?05/30/21 130/80  ? ?Pulse Readings from Last 3 Encounters:  ?07/22/21 91  ?06/11/21 84  ?05/30/21 83  ? ?Wt Readings from Last 3 Encounters:  ?07/22/21 189 lb 6.4 oz (85.9 kg)  ?06/11/21 194 lb (88 kg)  ?05/30/21 194 lb (88 kg)  ? ? ?Assessment: Review of patient past  medical history, allergies, medications, health status, including review of consultants reports, laboratory and other test data, was performed as part of comprehensive evaluation and provision of chronic care management services.  ? ?SDOH:  (Social Determinants of Health) assessments and interventions performed:  ? ? ?CCM Care Plan ? ?Allergies  ?Allergen Reactions  ? Sulfa Antibiotics Swelling  ?   Facial Swelling ?"swelling lips" ?  ? Sulfonamide Derivatives Swelling  ?  Lips Swelling  ? Other   ?  BLOOD PRODUCT REFUSAL  ? ? ?Medications Reviewed Today   ? ? Reviewed by Edythe Clarity, RPH (Pharmacist) on 09/01/21 at [place] List Status: <None>  ? ?Medication Order Taking? Sig Documenting Provider Last Dose Status Informant  ?apixaban (ELIQUIS) 5 MG TABS tablet [id number] Yes TAKE 1 TABLET BY MOUTH 2 TIMES DAILY Monge, Helane Gunther, NP Taking Active   ?blood glucose meter kit and supplies [id number] Yes Dispense based on patient and insurance preference. Use up to two  times daily as directed. (FOR ICD-10 E10.9, E11.9). Allwardt, Randa Evens, PA-C Taking Active   ?carvedilol (COREG) 3.125 MG tablet [id number] Yes TAKE 1 TABLET TWICE DAILY WITH A MEAL Allwardt, Alyssa M, PA-C Taking Active   ?Cholecalciferol (VITAMIN D3) 50 MCG (2000 UT) CHEW [id number] Yes Chew 4,000 Units by mouth daily. [provider] Taking Active   ?Cyanocobalamin (B-12 PO) [id number] Yes Take 2 tablets by mouth daily. [provider] Taking Active Self  ?HYDROcodone-acetaminophen (NORCO/VICODIN) 5-325 MG tablet [id number] Yes Take 1 tablet by mouth 2 (two) times daily as needed for moderate pain. [provider] Taking Active Self  ?insulin glargine (LANTUS SOLOSTAR) 100 UNIT/ML Solostar Pen [id number] Yes Inject 25 Units into the skin at bedtime. Allwardt, Randa Evens, PA-C Taking Active   ?Insulin Pen Needle (DROPLET PEN NEEDLES) 32G X 4 MM MISC [id number] Yes USE AS DIRECTED ONE TIME DAILY Allwardt, Alyssa M, PA-C Taking  Active   ?lisinopril (ZESTRIL) 20 MG tablet [id number] Yes Take 1 tablet (20 mg total) by mouth daily. Allwardt, Randa Evens, PA-C Taking Active   ?metFORMIN (GLUCOPHAGE) 1000 MG tablet [id number]  Take 1 tablet (1,000 mg total) by mouth in the morning and at bedtime. Allwardt, Alyssa Jerilynn Mages, PA-C  Expired 07/22/21 2359   ?metolazone (ZAROXOLYN) 2.5 MG tablet [id number]  Take 1 tablet (2.5 mg total) by mouth daily  for 1 day. Lenna Sciara, NP  Expired 07/22/21 2359   ?potassium chloride SA (KLOR-CON M) 20 MEQ tablet [id number]  Take 1 tablet (20 mEq total) by mouth 2 (two) times daily. Take with Lasix Lenna Sciara, NP  Expired 07/22/21 2359   ?pramipexole (MIRAPEX) 1 MG tablet [id number] Yes TAKE 1 TABLET TWICE DAILY Allwardt, Alyssa M, PA-C Taking Active   ?Torsemide 40 MG TABS [id number] Yes Take 1 tablet by mouth 2 (two) times daily. Minus Breeding, MD Taking Active   ?triamcinolone cream (KENALOG) 0.1 % [id number] Yes Apply 1 application topically 2 (two) times daily as needed (redness). Apply to red areas on legs Allwardt, Randa Evens, PA-C Taking Active   ? ?  ?  ? ?  ? ? ?Patient Active Problem List  ? Diagnosis Date Noted  ? Permanent atrial fibrillation ([place]) 07/21/2021  ? Persistent atrial fibrillation ([place]) 09/26/2020  ? Educated about COVID-19 virus infection 11/30/2019  ? Chronic respiratory failure with hypoxia and hypercapnia ([place]) 07/11/2019  ? Acute on chronic diastolic CHF (congestive heart failure) ([place]) 07/08/2019  ? Hypoxia 07/07/2019  ? Hypokalemia 05/03/2019  ? Facial asymmetry, acquired 08/20/2016  ? Fracture of knee prosthesis ([place]) 08/01/2016  ? Obesity 02/20/2016  ? Chronic pain syndrome 02/19/2016  ? Mild episode of recurrent major depressive disorder ([place]) 08/02/2015  ? Chronic diastolic congestive heart failure ([place]) 07/16/2015  ? Type 2 diabetes mellitus with diabetic neuropathy ([place]) 09/27/2014  ? Hearing loss - has hearing aides, follow by audiologist 10/11/2013  ? No blood products ([place])  09/11/2013  ? Disorder of bone and cartilage 06/10/2010  ? Breast cancer of upper-outer quadrant of right female breast ([place]) 04/04/2010  ? RESTLESS LEGS SYNDROME 05/05/2007  ? Obstructive sleep apnea 10

## 2021-09-01 NOTE — Patient Instructions (Addendum)
Visit Information ? ? Goals Addressed   ? ?  ?  ?  ?  ? This Visit's Progress  ?  Improve medication adherence   On track  ?  Timeframe:  Long-Range Goal ?Priority:  High ?Start Date:   01/21/21                          ?Expected End Date: 07/21/21                     ? ?Follow Up Date 04/22/21  ?  ?Work to get set up with [employer] for pill packs. ?  ?Why is this important?   ?These steps will help you keep on track with your medicines. ?  ?Notes:  ?  ? ?  ? ?Long-Range Goal: Patient-Specific Goal   ?Start Date: 11/13/2020  ?Expected End Date: 11/13/2021  ?Recent Progress: On track  ?Priority: High  ?Note:   ?Current Barriers:  ?Unable to self administer medications as prescribed ? ?Pharmacist Clinical Goal(s):  ?Patient will contact provider office for questions/concerns as evidenced notation of same in electronic health record through collaboration with PharmD and provider.  ? ?Interventions: ?1:1 collaboration with Allwardt, Randa Evens, PA-C regarding development and update of comprehensive plan of care as evidenced by provider attestation and co-signature ?Inter-disciplinary care team collaboration (see longitudinal plan of care) ?Comprehensive medication review performed; medication list updated in electronic medical record ? ?Hypertension (BP goal <130/80) ?-Controlled ?-Current treatment: ?Carvedilol 3.125 mg twice daily Appropriate, Effective, Safe, Accessible  ?Lisinopril 20 mg once daily  Appropriate, Effective, Safe, Accessible  ?Torsemide '40mg'$  BID  Appropriate, Effective, Safe, Accessible  ?-Current home readings: none provided, does have cuff and will provide/ask for asssitance using when next seen  ?-Denies hypotensive/hypertensive symptoms ?-Educated on Symptoms of hypotension and importance of maintaining adequate hydration; ?-Counseled to monitor BP at home 1-2x/week in addition to daily weights, document, and provide log at future appointments ?-Recommended to continue current  medication ? ?Update 01/21/21 ?Reports BP around 130/80. ?Denies any recent dizziness or HA's. ?Stated she just took a home COVID-19 test and it was positive. ?Continue to monitor for now. ? ?Update 05/27/21 ?Not checking BP at home lately ?No dizziness or HA ?BP in hospital was elevated, patient reports feeling more like herself now. ?She is living at home alone, encouraged her to reach out to me directly should she have any concerns or need immediate questions answered. ? ?Update 09/01/21 ?BP controlled, denies dizziness or HA. ?Denies any swelling. ?Continue current meds, no changes needed. ? ? ?Diabetes (A1c goal <7%) ?-Controlled ?-Has not located blood glucose meter, is open to receiving meter now. ?-Current medications: ?Metformin 1000 mg twice daily Appropriate, Effective, Safe, Accessible  ?Lantus 25 units into the skin at bedtime Appropriate, Effective, Safe, Accessible  ?-Current home glucose readings ?fasting glucose: is not testing ?-Denies hypoglycemic/hyperglycemic symptoms ?-Current exercise: no formal exercise ?-Educated on A1c and blood sugar goals; ?Prevention and management of hypoglycemic episodes; ?Benefits of routine self-monitoring of blood sugar; ?-Recommended to continue current medication ?Will order new BG monitor after review ? ?Update 01/21/21 ?Fasting this morning @ 5am - 110 ?Highest reported sugar around 150 ?She has gotten new meter and has just started checking her sugars at home ?Denies hypoglycemia ?Continue current meds for now.  Pill packs to help with adherence ? ?Update 05/27/21 ?Patient has not checked her sugar at home recently, have asked she do this a few times  per week at least and let me know if fasting is consistently > 130 or post prandial is > 200. ?She reports not feeling well last month but is more back to her normal self lately. ?Denies any glucose < 80. ?She is now set up in [facility] pill packs, however her mail order keeps sending her prescriptions which is  messing up the synchronization. ?Daughter has called to inform mail order she no longer needs. ?Continue current meds - due for updated A1c ? ?Update 09/01/21 ?She reports sugar has been controlled, no logs to discuss today.  Does mention one or two low glucose readings < 70.  She is still using correct dose of insulin.  Does state she fell recently and her toes are "black and blue."  No cuts and declines office visit at this time. ?She is planning to move to [place], [place] to live with her youngest daughter. ?Denies any concern with medication at this time, please contact us with any frequent episodes of low blood sugar! ?Continue current medications. ? ?Patient Goals/Self-Care Activities ?Patient will:  ?- focus on medication adherence by using pill packaging.  Setting up with [facility] pill packs for adherence and ease of medication administration. ? ?Follow Up Plan: CMA fu 30 days on adherence ?  ? ?  ? ?  ?Edythe Clarity, [facility]  ?Beverly Milch, PharmD ?Clinical Pharmacist  ?[place] ?[phone] ?

## 2021-09-06 DIAGNOSIS — S93692A Other sprain of left foot, initial encounter: Secondary | ICD-10-CM | POA: Diagnosis not present

## 2021-09-08 ENCOUNTER — Telehealth: Payer: Self-pay | Admitting: Pharmacist

## 2021-09-08 ENCOUNTER — Other Ambulatory Visit: Payer: Self-pay | Admitting: Physician Assistant

## 2021-09-08 ENCOUNTER — Other Ambulatory Visit: Payer: Self-pay | Admitting: Cardiology

## 2021-09-08 DIAGNOSIS — S92345A Nondisplaced fracture of fourth metatarsal bone, left foot, initial encounter for closed fracture: Secondary | ICD-10-CM | POA: Diagnosis not present

## 2021-09-08 DIAGNOSIS — S92335A Nondisplaced fracture of third metatarsal bone, left foot, initial encounter for closed fracture: Secondary | ICD-10-CM | POA: Diagnosis not present

## 2021-09-08 DIAGNOSIS — E1140 Type 2 diabetes mellitus with diabetic neuropathy, unspecified: Secondary | ICD-10-CM

## 2021-09-08 DIAGNOSIS — S92355A Nondisplaced fracture of fifth metatarsal bone, left foot, initial encounter for closed fracture: Secondary | ICD-10-CM | POA: Diagnosis not present

## 2021-09-08 DIAGNOSIS — S92325A Nondisplaced fracture of second metatarsal bone, left foot, initial encounter for closed fracture: Secondary | ICD-10-CM | POA: Diagnosis not present

## 2021-09-08 NOTE — Progress Notes (Signed)
? ? ?  Chronic Care Management ?Pharmacy Assistant  ? ?Name: [name]  MRN: [id number] DOB: 1940/08/14 ? ? ?Reason for Encounter: General Adherence Call ?  ? ?Recent office visits:  ?None ? ?Recent consult visits:  ?None ? ?Hospital visits:  ?None in previous 6 months ? ?Medications: ?Outpatient Encounter Medications as of 09/08/2021  ?Medication Sig  ? apixaban (ELIQUIS) 5 MG TABS tablet TAKE 1 TABLET BY MOUTH 2 TIMES DAILY  ? blood glucose meter kit and supplies Dispense based on patient and insurance preference. Use up to two  times daily as directed. (FOR ICD-10 E10.9, E11.9).  ? carvedilol (COREG) 3.125 MG tablet TAKE 1 TABLET TWICE DAILY WITH A MEAL  ? Cholecalciferol (VITAMIN D3) 50 MCG (2000 UT) CHEW Chew 4,000 Units by mouth daily.  ? Cyanocobalamin (B-12 PO) Take 2 tablets by mouth daily.  ? HYDROcodone-acetaminophen (NORCO/VICODIN) 5-325 MG tablet Take 1 tablet by mouth 2 (two) times daily as needed for moderate pain.  ? insulin glargine (LANTUS SOLOSTAR) 100 UNIT/ML Solostar Pen Inject 25 Units into the skin at bedtime.  ? Insulin Pen Needle (DROPLET PEN NEEDLES) 32G X 4 MM MISC USE AS DIRECTED ONE TIME DAILY  ? lisinopril (ZESTRIL) 20 MG tablet Take 1 tablet (20 mg total) by mouth daily.  ? metFORMIN (GLUCOPHAGE) 1000 MG tablet TAKE ONE TABLET BY MOUTH EVERY MORNING and TAKE ONE TABLET BY MOUTH EVERY EVENING  ? metolazone (ZAROXOLYN) 2.5 MG tablet Take 1 tablet (2.5 mg total) by mouth daily for 1 day.  ? potassium chloride SA (KLOR-CON M) 20 MEQ tablet Take 1 tablet (20 mEq total) by mouth 2 (two) times daily. Take with Lasix  ? pramipexole (MIRAPEX) 1 MG tablet TAKE 1 TABLET TWICE DAILY  ? Torsemide 40 MG TABS Take 1 tablet by mouth 2 (two) times daily.  ? triamcinolone cream (KENALOG) 0.1 % Apply 1 application topically 2 (two) times daily as needed (redness). Apply to red areas on legs  ? ?No facility-administered encounter medications on file as of 09/08/2021.  ? ?Reviewed chart for medication  changes ahead of medication coordination call. ? ?No OVs, Consults, or hospital visits since last care coordination call/Pharmacist visit. ? ?No medication changes indicated. ? ?BP Readings from Last 3 Encounters:  ?07/22/21 128/60  ?06/11/21 126/90  ?05/30/21 130/80  ?  ?Lab Results  ?Component Value Date  ? HGBA1C 7.4 (H) 02/10/2021  ?  ? ?Patient obtains medications through Adherence Packaging  30 Days  ? ?Last adherence delivery included: ?Metformin 1000 mg twice daily ?Torsemide 20 mg two tablets twice daily ? ? ?Patient is due for next adherence delivery on: 09/18/2021. ?Called patient and reviewed medications and coordinated delivery. ? ?This delivery to include: ?Metformin 1000 mg twice daily ?Torsemide 20 mg two tablets twice daily ? ? ?Confirmed delivery date of 09/18/2021, advised patient that pharmacy will contact them the morning of delivery. ? ?Care Gaps: ?Medicare Annual Wellness: Overdue ?Ophthalmology Exam: Overdue since 12/20/2019 ?Foot Exam: Overdue since 01/24/2020 ?Hemoglobin A1C: 7.4% on 02/10/2021 ?Colonoscopy: Aged out ?Dexa scan: Completed  ? ?Future Appointments  ?Date Time Provider [place]  ?10/13/2021  8:45 AM [facility] HE[facility] COACH [facility]  ? ?April D Calhoun, [place] ?Clinical Pharmacist Assistant ?[phone] ?

## 2021-09-14 DIAGNOSIS — E118 Type 2 diabetes mellitus with unspecified complications: Secondary | ICD-10-CM

## 2021-09-14 DIAGNOSIS — I10 Essential (primary) hypertension: Secondary | ICD-10-CM

## 2021-09-29 DIAGNOSIS — S92355D Nondisplaced fracture of fifth metatarsal bone, left foot, subsequent encounter for fracture with routine healing: Secondary | ICD-10-CM | POA: Diagnosis not present

## 2021-09-29 DIAGNOSIS — S92325D Nondisplaced fracture of second metatarsal bone, left foot, subsequent encounter for fracture with routine healing: Secondary | ICD-10-CM | POA: Diagnosis not present

## 2021-09-29 DIAGNOSIS — S92335D Nondisplaced fracture of third metatarsal bone, left foot, subsequent encounter for fracture with routine healing: Secondary | ICD-10-CM | POA: Diagnosis not present

## 2021-09-29 DIAGNOSIS — S92345D Nondisplaced fracture of fourth metatarsal bone, left foot, subsequent encounter for fracture with routine healing: Secondary | ICD-10-CM | POA: Diagnosis not present

## 2021-10-03 ENCOUNTER — Telehealth: Payer: Self-pay | Admitting: Physician Assistant

## 2021-10-03 NOTE — Telephone Encounter (Signed)
Spoke with patient to r/s her AWV Patient stated she moved to [place]

## 2021-10-07 ENCOUNTER — Telehealth: Payer: Self-pay | Admitting: Pharmacist

## 2021-10-07 NOTE — Progress Notes (Signed)
Chronic Care Management Pharmacy Assistant   Name: [name]  MRN: [id number] DOB: 1940/10/16   Reason for Encounter: Medication Coordination Call    Recent office visits:  None since last CPP visit  Recent consult visits:  None since last CPP visit  Hospital visits:  None in previous 6 months  Medications: Outpatient Encounter Medications as of 10/07/2021  Medication Sig   apixaban (ELIQUIS) 5 MG TABS tablet TAKE 1 TABLET BY MOUTH 2 TIMES DAILY   blood glucose meter kit and supplies Dispense based on patient and insurance preference. Use up to two  times daily as directed. (FOR ICD-10 E10.9, E11.9).   carvedilol (COREG) 3.125 MG tablet TAKE 1 TABLET TWICE DAILY WITH A MEAL   Cholecalciferol (VITAMIN D3) 50 MCG (2000 UT) CHEW Chew 4,000 Units by mouth daily.   Cyanocobalamin (B-12 PO) Take 2 tablets by mouth daily.   HYDROcodone-acetaminophen (NORCO/VICODIN) 5-325 MG tablet Take 1 tablet by mouth 2 (two) times daily as needed for moderate pain.   insulin glargine (LANTUS SOLOSTAR) 100 UNIT/ML Solostar Pen Inject 25 Units into the skin at bedtime.   Insulin Pen Needle (DROPLET PEN NEEDLES) 32G X 4 MM MISC USE AS DIRECTED ONE TIME DAILY   lisinopril (ZESTRIL) 20 MG tablet Take 1 tablet (20 mg total) by mouth daily.   metFORMIN (GLUCOPHAGE) 1000 MG tablet TAKE ONE TABLET BY MOUTH EVERY MORNING and TAKE ONE TABLET BY MOUTH EVERY EVENING   metolazone (ZAROXOLYN) 2.5 MG tablet Take 1 tablet (2.5 mg total) by mouth daily for 1 day.   potassium chloride SA (KLOR-CON M) 20 MEQ tablet Take 1 tablet (20 mEq total) by mouth 2 (two) times daily. Take with Lasix   pramipexole (MIRAPEX) 1 MG tablet TAKE 1 TABLET TWICE DAILY   torsemide (DEMADEX) 20 MG tablet TAKE TWO TABLETS BY MOUTH EVERY MORNING and TAKE TWO TABLETS BY MOUTH EVERY EVENING   Torsemide 40 MG TABS Take 1 tablet by mouth 2 (two) times daily.   triamcinolone cream (KENALOG) 0.1 % Apply 1 application topically 2 (two) times  daily as needed (redness). Apply to red areas on legs   No facility-administered encounter medications on file as of 10/07/2021.   Reviewed chart for medication changes ahead of medication coordination call.  No OVs, Consults, or hospital visits since last care coordination call/Pharmacist visit.   No medication changes indicated OR if recent visit, treatment plan here.  BP Readings from Last 3 Encounters:  07/22/21 128/60  06/11/21 126/90  05/30/21 130/80    Lab Results  Component Value Date   HGBA1C 7.4 (H) 02/10/2021     Patient obtains medications through Adherence Packaging  30 Days   Last adherence delivery included: Metformin 1000 mg 1 at breakfast, 1 at evening meal Torsemide 20 mg , 2 at breakfast, 2 in evening  Patient is due for next adherence delivery on: 10/20/2021. Called patient and reviewed medications and coordinated delivery.  This delivery to include: Metformin 1000 mg 1 at breakfast, 1 at evening meal Torsemide 20 mg , 2 at breakfast, 2 in evening   Confirmed delivery date of 10/20/2021, advised patient that pharmacy will contact them the morning of delivery.  -Patient states she will not be needing any more deliveries after her scheduled delivery on 10/20/2021. She is moving to [place], [place] on 10/30/2021.   Care Gaps: Medicare Annual Wellness: Overdue Ophthalmology Exam: Overdue since 12/20/2019 Foot Exam: Overdue since 01/24/2020 Hemoglobin A1C: 7.4% on 02/10/2021 Colonoscopy: Aged out Dexa  Scan: Completed 12/07/2019 Mammogram: Completed 03/25/2021  No future appointments.  Star [facility]:  April D Calhoun, [place] Pharmacist Assistant [phone]

## 2021-10-13 ENCOUNTER — Ambulatory Visit: Payer: Medicare HMO

## 2021-10-27 DIAGNOSIS — M79672 Pain in left foot: Secondary | ICD-10-CM | POA: Diagnosis not present

## 2021-11-10 ENCOUNTER — Ambulatory Visit: Payer: Self-pay | Admitting: *Deleted

## 2021-11-10 DIAGNOSIS — E118 Type 2 diabetes mellitus with unspecified complications: Secondary | ICD-10-CM

## 2021-11-10 DIAGNOSIS — I10 Essential (primary) hypertension: Secondary | ICD-10-CM

## 2021-11-10 NOTE — Chronic Care Management (AMB) (Signed)
  Care Management   Follow Up Note   11/10/2021 Name: RAZIYAH IMBERT MRN: [id number] DOB: 1941-01-04   Referred by: Allwardt, Crist Infante, PA-C Reason for referral : Case Closure   Third unsuccessful telephone outreach was attempted today. The patient was referred to the case management team for assistance with care management and care coordination. The patient's primary care provider has been notified of our unsuccessful attempts to make or maintain contact with the patient. The care management team is pleased to engage with this patient at any time in the future should he/she be interested in assistance from the care management team.   Follow Up Plan: We have been unable to make contact with the patient for follow up. The care management team is available to follow up with the patient after provider conversation with the patient regarding recommendation for care management engagement and subsequent re-referral to the care management team.   Rhae Lerner RN, MSN RN Care Management Coordinator  [facility] [place] [phone] [email or web address]

## 2021-11-12 DIAGNOSIS — J9611 Chronic respiratory failure with hypoxia: Secondary | ICD-10-CM | POA: Diagnosis not present

## 2021-12-11 ENCOUNTER — Other Ambulatory Visit: Payer: Self-pay | Admitting: Physician Assistant

## 2021-12-11 DIAGNOSIS — E1140 Type 2 diabetes mellitus with diabetic neuropathy, unspecified: Secondary | ICD-10-CM

## 2021-12-17 ENCOUNTER — Other Ambulatory Visit: Payer: Self-pay | Admitting: Nurse Practitioner

## 2021-12-17 NOTE — Telephone Encounter (Signed)
Rx(s) sent to pharmacy electronically.  

## 2022-01-06 ENCOUNTER — Telehealth: Payer: Self-pay | Admitting: Physician Assistant

## 2022-01-06 ENCOUNTER — Other Ambulatory Visit: Payer: Self-pay

## 2022-01-06 ENCOUNTER — Telehealth: Payer: Self-pay | Admitting: Cardiology

## 2022-01-06 ENCOUNTER — Telehealth: Payer: Self-pay | Admitting: Cardiovascular Disease

## 2022-01-06 DIAGNOSIS — E1159 Type 2 diabetes mellitus with other circulatory complications: Secondary | ICD-10-CM

## 2022-01-06 DIAGNOSIS — E1140 Type 2 diabetes mellitus with diabetic neuropathy, unspecified: Secondary | ICD-10-CM

## 2022-01-06 DIAGNOSIS — I10 Essential (primary) hypertension: Secondary | ICD-10-CM

## 2022-01-06 DIAGNOSIS — I4819 Other persistent atrial fibrillation: Secondary | ICD-10-CM

## 2022-01-06 MED ORDER — TORSEMIDE 20 MG PO TABS: ORAL_TABLET | ORAL | 0 refills | Status: AC

## 2022-01-06 MED ORDER — METFORMIN HCL 1000 MG PO TABS: ORAL_TABLET | ORAL | 0 refills | Status: AC

## 2022-01-06 MED ORDER — DROPLET PEN NEEDLES 32G X 4 MM MISC: 0 refills | Status: AC

## 2022-01-06 MED ORDER — APIXABAN 5 MG PO TABS: 5.0000 mg | ORAL_TABLET | Freq: Two times a day (BID) | ORAL | 0 refills | Status: DC

## 2022-01-06 MED ORDER — TRIAMCINOLONE ACETONIDE 0.1 % EX CREA: 1.0000 | TOPICAL_CREAM | Freq: Two times a day (BID) | CUTANEOUS | 0 refills | Status: AC | PRN

## 2022-01-06 MED ORDER — LISINOPRIL 20 MG PO TABS: 20.0000 mg | ORAL_TABLET | Freq: Every day | ORAL | 0 refills | Status: AC

## 2022-01-06 MED ORDER — LANTUS SOLOSTAR 100 UNIT/ML ~~LOC~~ SOPN: PEN_INJECTOR | SUBCUTANEOUS | 0 refills | Status: AC

## 2022-01-06 MED ORDER — CARVEDILOL 3.125 MG PO TABS: ORAL_TABLET | ORAL | 0 refills | Status: AC

## 2022-01-06 MED ORDER — APIXABAN 5 MG PO TABS: 5.0000 mg | ORAL_TABLET | Freq: Two times a day (BID) | ORAL | 1 refills | Status: AC

## 2022-01-06 MED ORDER — METOLAZONE 2.5 MG PO TABS: 2.5000 mg | ORAL_TABLET | Freq: Every day | ORAL | 0 refills | Status: AC

## 2022-01-06 MED ORDER — PRAMIPEXOLE DIHYDROCHLORIDE 1 MG PO TABS: 1.0000 mg | ORAL_TABLET | Freq: Two times a day (BID) | ORAL | 0 refills | Status: AC

## 2022-01-06 MED ORDER — POTASSIUM CHLORIDE CRYS ER 20 MEQ PO TBCR: 20.0000 meq | EXTENDED_RELEASE_TABLET | Freq: Two times a day (BID) | ORAL | 0 refills | Status: AC

## 2022-01-06 NOTE — Telephone Encounter (Signed)
Spoke to patient's daughter she stated mother has moved to [place].She is looking for a new cardiologist.She is requesting refills.Refills sent to [facility] mail order pharmacy.

## 2022-01-06 NOTE — Telephone Encounter (Signed)
Eliquis '5mg'$  refill request received. Patient is 81 years old, weight-85.9kg, Crea-0.99 on 07/22/2021, Diagnosis-Afib, and last seen by Dr. Percival Spanish on 07/22/2021. Dose is appropriate based on dosing criteria. Will send in refill to requested pharmacy.

## 2022-01-06 NOTE — Telephone Encounter (Signed)
Patient daughter [name] states Patient has changed address to [place] and requests the following RX's for Patient until Patient can establish care in [place]:   metFORMIN (GLUCOPHAGE) 1000 MG tablet  pramipexole (MIRAPEX) 1 MG tablet  lisinopril (ZESTRIL) 20 MG tablet  carvedilol (COREG) 3.125 MG tablet  Insulin Pen Needle (DROPLET PEN NEEDLES) 32G X 4 MM MISC LANTUS SOLOSTAR 100 UNIT/ML Solostar Pen  LANTUS SOLOSTAR 100 UNIT/ML Solostar Pen  triamcinolone cream (KENALOG) 0.1 %   Be sent to: PHARMACY: [place], [place] Phone:  [phone]  Fax:  [phone]       Let patient know to contact pharmacy at the end of the day to make sure medication is ready.  Please notify patient to allow 48-72 hours to process

## 2022-01-06 NOTE — Telephone Encounter (Signed)
Patient's daughter would like to know if there was anything cheap than the Eliquis patient is taking

## 2022-01-06 NOTE — Telephone Encounter (Signed)
Sent Rx to desired pharmacy, also called pt and left VM that I sent the medications.

## 2022-01-06 NOTE — Telephone Encounter (Signed)
*  STAT* If patient is at the pharmacy, call can be transferred to refill team.   1. Which medications need to be refilled? (please list name of each medication and dose if known) apixaban (ELIQUIS) 5 MG TABS tablet; metolazone (ZAROXOLYN) 2.5 MG tablet; potassium chloride SA (KLOR-CON M) 20 MEQ tablet (Expired); torsemide (DEMADEX) 20 MG tablet  2. Which pharmacy/location (including street and city if local pharmacy) is medication to be sent to? [place], [place]  3. Do they need a 30 day or 90 day supply? 90 for all prescriptions listed for refill

## 2022-01-13 DIAGNOSIS — H524 Presbyopia: Secondary | ICD-10-CM | POA: Diagnosis not present

## 2022-01-13 DIAGNOSIS — H52209 Unspecified astigmatism, unspecified eye: Secondary | ICD-10-CM | POA: Diagnosis not present

## 2022-01-13 DIAGNOSIS — H52223 Regular astigmatism, bilateral: Secondary | ICD-10-CM | POA: Diagnosis not present

## 2022-02-09 ENCOUNTER — Encounter: Payer: Self-pay | Admitting: *Deleted

## 2022-02-18 ENCOUNTER — Telehealth: Payer: Self-pay | Admitting: Cardiology

## 2022-02-18 NOTE — Telephone Encounter (Signed)
Patient's daughter states she has moved to [place].

## 2022-04-30 ENCOUNTER — Encounter: Payer: Self-pay | Admitting: *Deleted

## 2022-04-30 ENCOUNTER — Telehealth: Payer: Self-pay | Admitting: Physician Assistant

## 2022-04-30 NOTE — Telephone Encounter (Signed)
Caller States: -Attempting to confirm PCP has signed script for Oxygen supply.   Caller Requests: -Return call for follow up.

## 2022-04-30 NOTE — Telephone Encounter (Signed)
Returned call and sat on hold for 20 mins unable to leave vm, returned fax again letting [name] know that she is no longer a patient of Alyssa Allwardt as pt has moved to [place]. Please advise if [place] returns call

## 2022-04-30 NOTE — Telephone Encounter (Signed)
Patient's Care team updated to reflect move/removal from [facility] panel

## 2022-05-02 ENCOUNTER — Other Ambulatory Visit: Payer: Self-pay | Admitting: Physician Assistant

## 2022-05-02 DIAGNOSIS — E1140 Type 2 diabetes mellitus with diabetic neuropathy, unspecified: Secondary | ICD-10-CM

## 2022-05-29 ENCOUNTER — Encounter: Payer: Self-pay | Admitting: *Deleted

## 2022-05-29 NOTE — Telephone Encounter (Signed)
This encounter was created in error - please disregard.

## 2022-12-18 IMAGING — CT CT HEAD W/O CM
4 series · 16 of 47 positions shown, 18 images · non-contrast
Comparison: 08/01/2016

CLINICAL DATA: Syncope, fell, hit head

EXAM:
CT HEAD WITHOUT CONTRAST
TECHNIQUE: Contiguous axial images were obtained from the base of the skull
through the vertex without intravenous contrast.

[Series 2: head wo · axial · 0.47mm/px · z∈[+1378,+1498]mm · 7 of 33 slices shown, 9 images]
[im 5/33  brain]
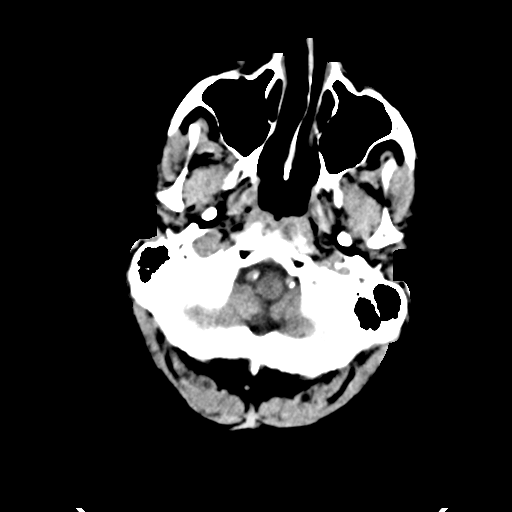
[im 5/33  bone]
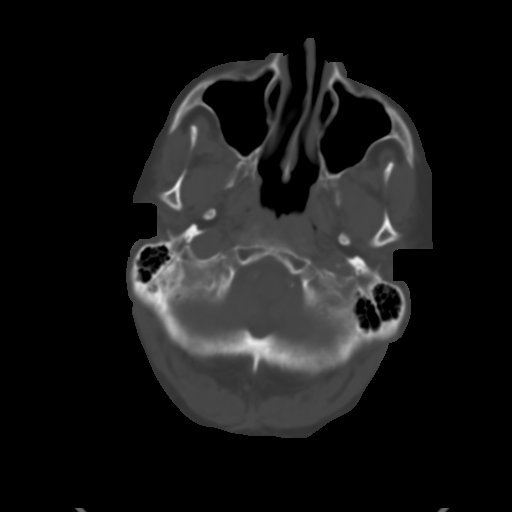
[im 9/33  brain]
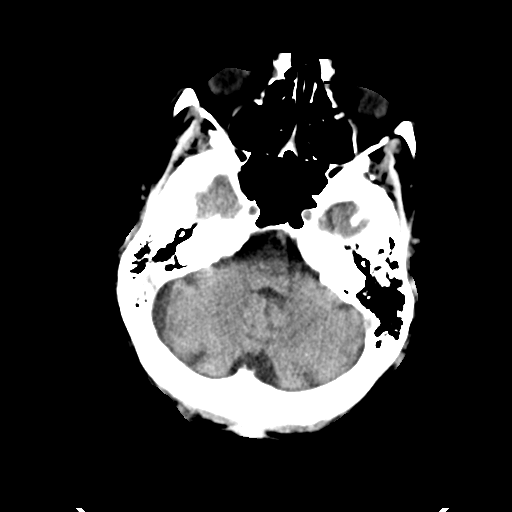
[im 13/33  brain]
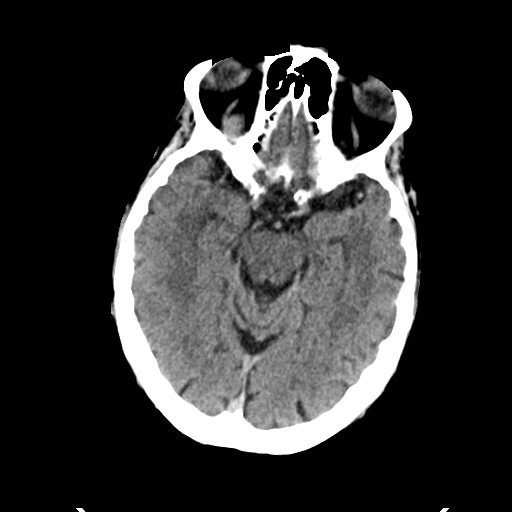
[im 17/33  brain]
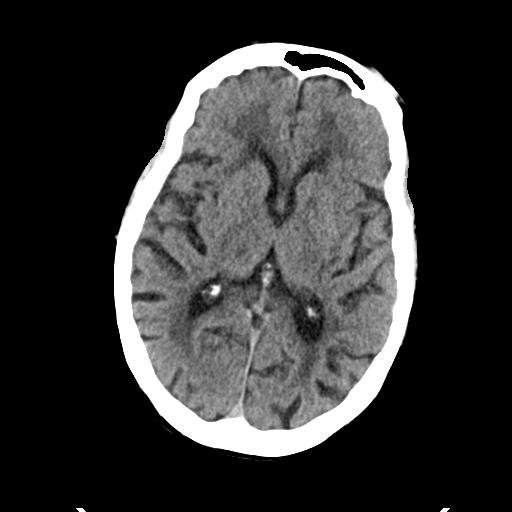
[im 21/33  brain]
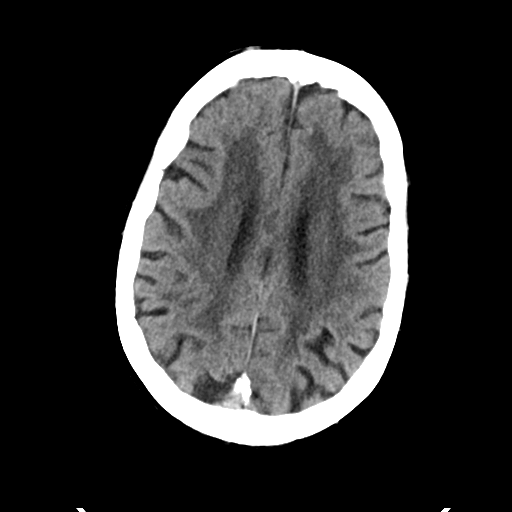
[im 21/33  bone]
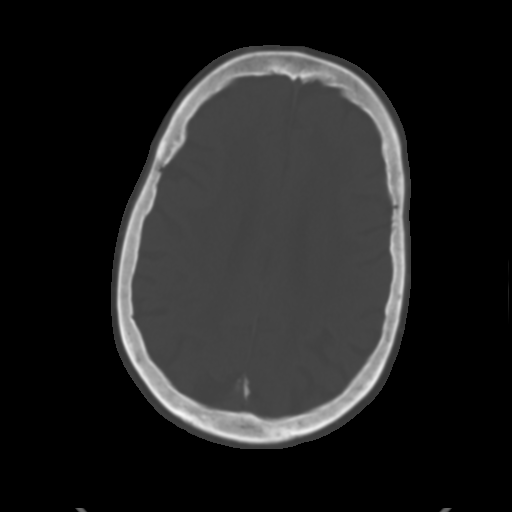
[im 25/33  brain]
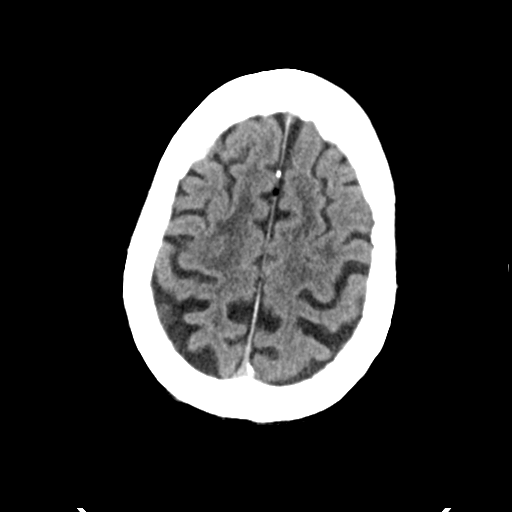
[im 29/33  brain]
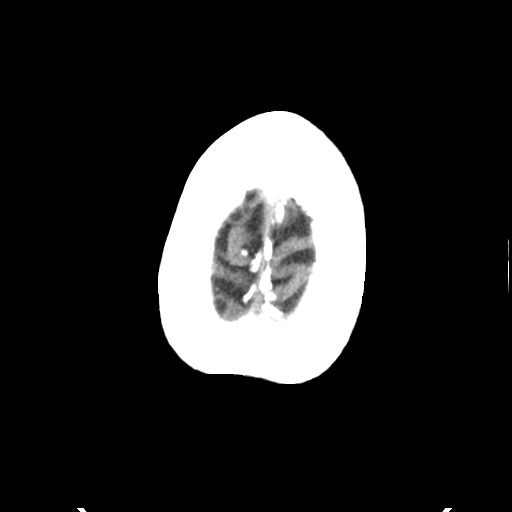

[Series 3: head bone · axial · 0.47mm/px · z∈[+1374,+1406]mm · 3 of 81 slices shown]
[im 9/81  bone]
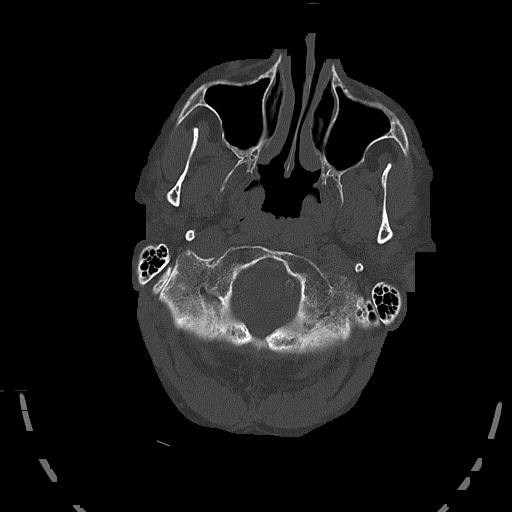
[im 17/81  bone]
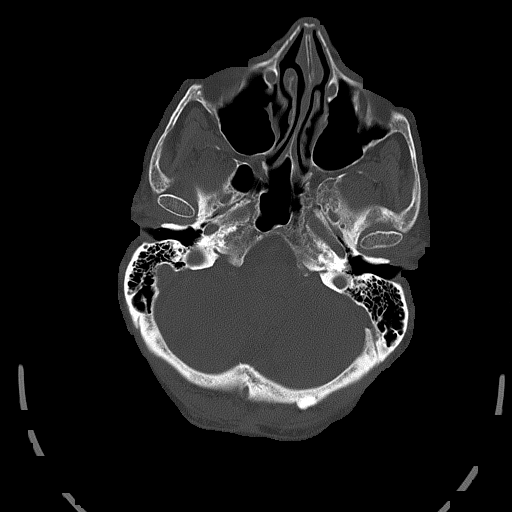
[im 25/81  bone]
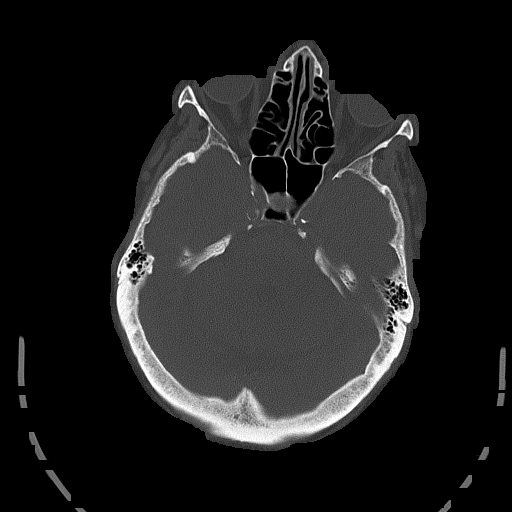

[Series 4: coronal soft tissue · coronal · 0.40mm/px · 3 of 76 slices shown]
[im 28/76  brain]
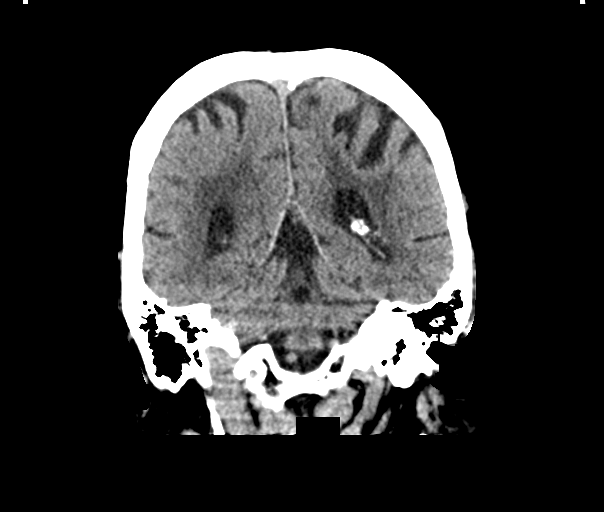
[im 35/76  brain]
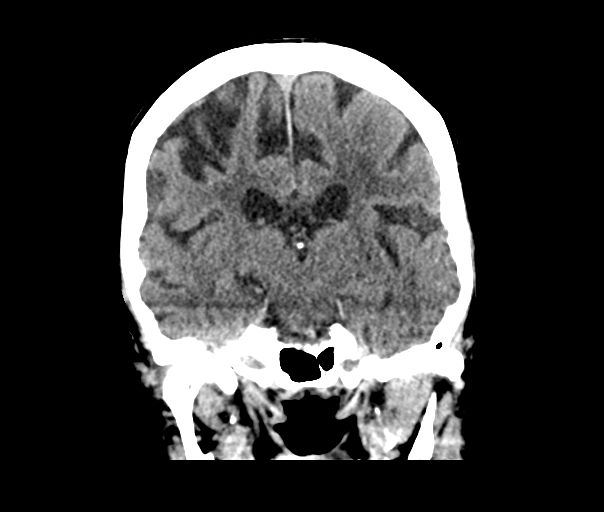
[im 41/76  brain]
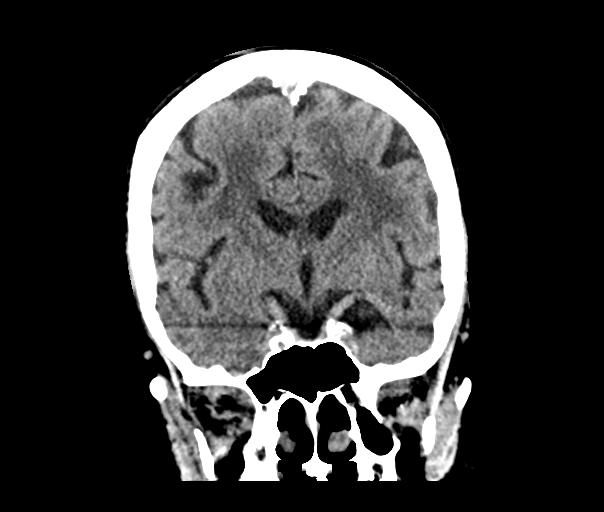

[Series 5: sagittal soft tissue · sagittal · 0.38mm/px · 3 of 67 slices shown]
[im 23/67  brain]
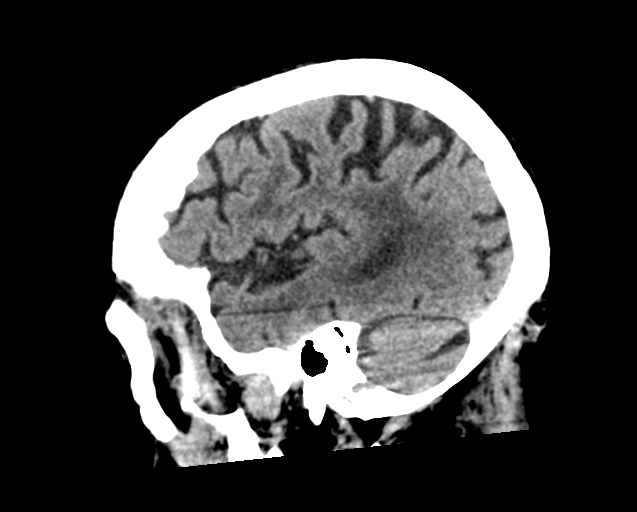
[im 34/67  brain]
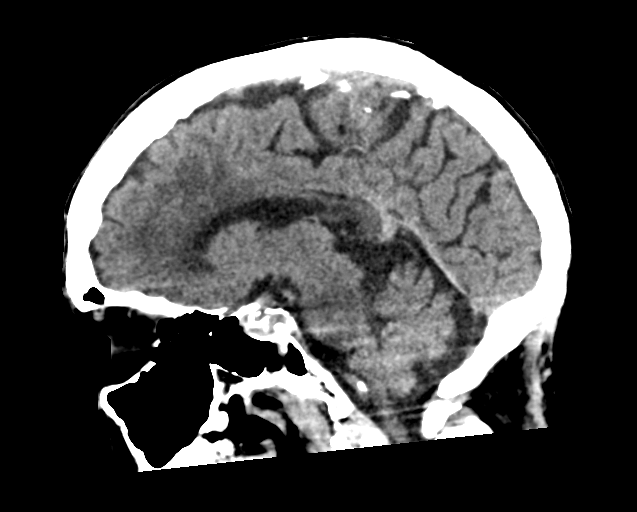
[im 45/67  brain]
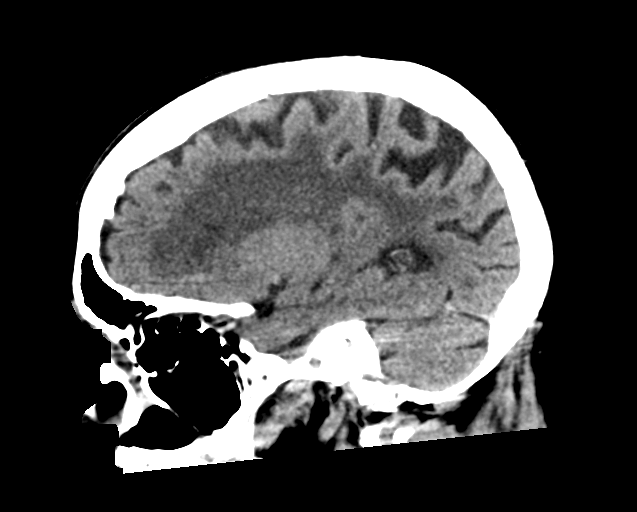

[16 of 47 positions shown; findings below may reference images not displayed]

FINDINGS: Brain: No signs of acute infarct or hemorrhage. Confluent
hypodensities throughout the periventricular white matter consistent
with chronic small vessel ischemic change, stable. Lateral
ventricles and remaining midline structures are unremarkable.
Chronic lacunar infarcts are seen within the bilateral basal ganglia
and right thalamus. No acute extra-axial fluid collections. No mass
effect.

Vascular: Stable atherosclerosis.  No hyperdense vessel.

Skull: Normal. Negative for fracture or focal lesion.

Sinuses/Orbits: No acute finding.

Other: None.
IMPRESSION: 1. Stable chronic small vessel ischemic changes as above. No acute
intracranial process.
